# Patient Record
Sex: Female | Born: 1935 | Race: Black or African American | Hispanic: No | Smoking: Former smoker
Health system: Southern US, Community
[De-identification: ages and names within clinical notes are randomized; demographics above are authoritative.]

## PROBLEM LIST (undated history)

## (undated) ENCOUNTER — Emergency Department (HOSPITAL_COMMUNITY): Admission: EM | Payer: Self-pay | Source: Home / Self Care

## (undated) DIAGNOSIS — M1990 Unspecified osteoarthritis, unspecified site: Secondary | ICD-10-CM

## (undated) DIAGNOSIS — I420 Dilated cardiomyopathy: Secondary | ICD-10-CM

## (undated) DIAGNOSIS — G4733 Obstructive sleep apnea (adult) (pediatric): Secondary | ICD-10-CM

## (undated) DIAGNOSIS — M19011 Primary osteoarthritis, right shoulder: Secondary | ICD-10-CM

## (undated) DIAGNOSIS — D649 Anemia, unspecified: Secondary | ICD-10-CM

## (undated) DIAGNOSIS — I10 Essential (primary) hypertension: Secondary | ICD-10-CM

## (undated) DIAGNOSIS — S82202A Unspecified fracture of shaft of left tibia, initial encounter for closed fracture: Secondary | ICD-10-CM

## (undated) DIAGNOSIS — H409 Unspecified glaucoma: Secondary | ICD-10-CM

## (undated) DIAGNOSIS — I639 Cerebral infarction, unspecified: Secondary | ICD-10-CM

## (undated) DIAGNOSIS — K56609 Unspecified intestinal obstruction, unspecified as to partial versus complete obstruction: Secondary | ICD-10-CM

## (undated) DIAGNOSIS — R0600 Dyspnea, unspecified: Secondary | ICD-10-CM

## (undated) DIAGNOSIS — K219 Gastro-esophageal reflux disease without esophagitis: Secondary | ICD-10-CM

## (undated) DIAGNOSIS — I4891 Unspecified atrial fibrillation: Secondary | ICD-10-CM

## (undated) DIAGNOSIS — I5022 Chronic systolic (congestive) heart failure: Secondary | ICD-10-CM

## (undated) DIAGNOSIS — I442 Atrioventricular block, complete: Secondary | ICD-10-CM

## (undated) HISTORY — DX: Dilated cardiomyopathy: I42.0

## (undated) HISTORY — PX: CARDIAC CATHETERIZATION: SHX172

## (undated) HISTORY — DX: Chronic systolic (congestive) heart failure: I50.22

## (undated) HISTORY — PX: CATARACT EXTRACTION: SUR2

## (undated) HISTORY — PX: JOINT REPLACEMENT: SHX530

## (undated) HISTORY — DX: Unspecified atrial fibrillation: I48.91

## (undated) HISTORY — DX: Obstructive sleep apnea (adult) (pediatric): G47.33

## (undated) HISTORY — DX: Cerebral infarction, unspecified: I63.9

---
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
## 1957-08-30 HISTORY — PX: PILONIDAL CYST EXCISION: SHX744

## 1966-08-30 HISTORY — PX: DILATION AND CURETTAGE OF UTERUS: SHX78

## 1967-08-31 HISTORY — PX: ABDOMINAL HYSTERECTOMY: SHX81

## 1981-08-30 HISTORY — PX: EYE SURGERY: SHX253

## 1982-08-30 HISTORY — PX: BUNIONECTOMY: SHX129

## 1997-08-30 HISTORY — PX: OTHER SURGICAL HISTORY: SHX169

## 2002-08-30 HISTORY — PX: OTHER SURGICAL HISTORY: SHX169

## 2003-04-25 ENCOUNTER — Ambulatory Visit (HOSPITAL_COMMUNITY): Admission: RE | Admit: 2003-04-25 | Discharge: 2003-04-25 | Payer: Self-pay

## 2003-05-02 ENCOUNTER — Encounter: Admission: RE | Admit: 2003-05-02 | Discharge: 2003-05-02 | Payer: Self-pay | Admitting: Orthopedic Surgery

## 2003-05-02 ENCOUNTER — Encounter: Payer: Self-pay | Admitting: Orthopedic Surgery

## 2003-05-07 ENCOUNTER — Ambulatory Visit (HOSPITAL_BASED_OUTPATIENT_CLINIC_OR_DEPARTMENT_OTHER): Admission: RE | Admit: 2003-05-07 | Discharge: 2003-05-07 | Payer: Self-pay | Admitting: Orthopedic Surgery

## 2004-02-07 ENCOUNTER — Ambulatory Visit (HOSPITAL_BASED_OUTPATIENT_CLINIC_OR_DEPARTMENT_OTHER): Admission: RE | Admit: 2004-02-07 | Discharge: 2004-02-07 | Payer: Self-pay | Admitting: Orthopedic Surgery

## 2005-08-30 DIAGNOSIS — S82202A Unspecified fracture of shaft of left tibia, initial encounter for closed fracture: Secondary | ICD-10-CM

## 2005-08-30 HISTORY — DX: Unspecified fracture of shaft of left tibia, initial encounter for closed fracture: S82.202A

## 2005-09-14 ENCOUNTER — Encounter: Admission: RE | Admit: 2005-09-14 | Discharge: 2005-09-14 | Payer: Self-pay | Admitting: Family Medicine

## 2006-02-10 ENCOUNTER — Encounter: Admission: RE | Admit: 2006-02-10 | Discharge: 2006-05-11 | Payer: Self-pay | Admitting: Family Medicine

## 2006-02-12 ENCOUNTER — Emergency Department (HOSPITAL_COMMUNITY): Admission: EM | Admit: 2006-02-12 | Discharge: 2006-02-12 | Payer: Self-pay | Admitting: Emergency Medicine

## 2008-08-19 ENCOUNTER — Ambulatory Visit: Payer: Self-pay | Admitting: Vascular Surgery

## 2008-09-03 ENCOUNTER — Ambulatory Visit (HOSPITAL_BASED_OUTPATIENT_CLINIC_OR_DEPARTMENT_OTHER): Admission: RE | Admit: 2008-09-03 | Discharge: 2008-09-03 | Payer: Self-pay | Admitting: Orthopedic Surgery

## 2010-07-24 ENCOUNTER — Encounter: Admission: RE | Admit: 2010-07-24 | Discharge: 2010-07-24 | Payer: Self-pay | Admitting: Family Medicine

## 2010-07-27 ENCOUNTER — Encounter: Admission: RE | Admit: 2010-07-27 | Discharge: 2010-07-27 | Payer: Self-pay | Admitting: Family Medicine

## 2010-08-21 ENCOUNTER — Encounter
Admission: RE | Admit: 2010-08-21 | Discharge: 2010-08-21 | Payer: Self-pay | Source: Home / Self Care | Attending: Rheumatology | Admitting: Rheumatology

## 2011-01-12 NOTE — Op Note (Signed)
NAME:  Kim Mullins, Kim Mullins           ACCOUNT NO.:  [id number]   MEDICAL RECORD NO.:  [id number]          PATIENT TYPE:  AMB   LOCATION:  [place]                          FACILITY:  [facility]   PHYSICIAN:  Youlanda Mighty. Sypher, M.D. DATE OF BIRTH:  1930-03-27   DATE OF PROCEDURE:  09/03/2008  DATE OF DISCHARGE:                               OPERATIVE REPORT   PREOPERATIVE DIAGNOSIS:  Chronic stenosing tenosynovitis, right ring  finger, left long finger, and left ring finger.   POSTOPERATIVE DIAGNOSIS:  Chronic stenosing tenosynovitis, right ring  finger, left long finger, and left ring finger with identification of  ganglion cyst formation proximal to right ring finger A1 pulley.   OPERATION:  1. Release of right ring finger A1 pulley with debridement of myxoid      cyst from flexor sheath and flexor tendons.  2. Release of left long finger A1 pulley.  3. Release of left ring finger A1 pulley.   OPERATING SURGEON:  Youlanda Mighty. Sypher, MD   ASSISTANT:  Marily Lente Dasnoit, PA-C   ANESTHESIA:  General sedation supplemented by 2% lidocaine, field block  of right palm and left palm.   SUPERVISING ANESTHESIOLOGIST:  Soledad Gerlach, MD   INDICATIONS:  Kim Mullins is a 12-year woman referred through the  courtesy of Dr. Larina Earthly for evaluation and management of painful  locking trigger fingers.  Clinical examination revealed a otherwise  healthy 77 year old woman with stenosing tenosynovitis of multiple  fingers.  Arrangements were made for release of her involved fingers.  She had significant swelling over the flexor sheath of the right ring  finger, suggesting possible ganglion cyst formation.   After informed consent, she is brought to the operating room at this  time.   PROCEDURE IN DETAIL:  Kim Mullins is brought to the operating room  and placed in supine position on the operating table.  Following an  anesthesia consult with Dr. Ola Spurr, general anesthesia was  declined  and regional anesthesia with a field block of the palms and sedation  accepted.   She was brought to room 1 of the [facility] and placed in  supine position on the operating table.  Following light sedation, the  right and left arms were prepped with Betadine soap and solution and  sterilely draped.  A pneumatic tourniquet was applied to the proximal  left brachium and an Esmarch will be used on the right.   Following exsanguination of right hand and arm with the Esmarch, the  Esmarch was left on the proximal forearm as a tourniquet.  Procedure  commenced with a short transverse incision directly over the palpably  thickened A1 pulley of the ring finger.  Subcutaneous tissues were  carefully divided revealing a multilobular ganglion cyst proximal to the  A1 pulley.  This was debrided with a rongeur followed by identification  of the proximal and distal margins of the A1 pulley.  The pulleys were  released with scalpel and scissors.  The flexor tendons were delivered  and found to be rather degenerative in nature due to chronic  compression.   Free range of motion  of the fingers recovered.   Attention was then directed to the left hand.  The left hand was  exsanguinated with an Esmarch bandage and arterial tourniquet on the  brachium inflated to 280 mmHg due to systolic hypertension.   Procedure commenced with individual incisions over the A1 pulleys of the  left long and ring fingers.  Subcutaneous tissues were carefully divided  taking care to identify the flexor sheaths and neurovascular bundles.  The long and ring finger A1 pulleys were split with scalpel and scissors  followed by delivery of flexor tendons.  No tendon pathology was noted  on the left.  Thereafter, free range of motion of the left long and ring  fingers was recovered.   All wounds were then repaired with horizontal mattress sutures of 5-0  nylon.  Compressive dressings were applied with a  Xeroflo, sterile  gauze, and Ace bandages.   For aftercare, Ms. Dinwiddie will be encouraged to begin immediate range of  motion exercises.  To keep her wounds dry for 3 days.  Thereafter, she  may shower and redress her hands.  She is provided a prescription for  Percocet 5 mg 1 p.o. q.4-6 h. p.r.n. pain, 20 tablets without refill.  We will see her back in followup in 1 week for suture removal and  advancement to her exercise program.      Youlanda Mighty. Sypher, M.D.  Electronically Signed     RVS/MEDQ  D:  09/03/2008  T:  09/03/2008  Job:  [id number]   cc:   Tanya D. Hassell Done, M.D.

## 2011-01-15 NOTE — Op Note (Signed)
NAME:  Kim Mullins, Kim Mullins                       ACCOUNT NO.:  [id number]   MEDICAL RECORD NO.:  [id number]                   PATIENT TYPE:  AMB   LOCATION:  [place]                                  FACILITY:  [facility]   PHYSICIAN:  Youlanda Mighty. Luisa Dago., M.D.          DATE OF BIRTH:  October 16, 1929   DATE OF PROCEDURE:  05/07/2003  DATE OF DISCHARGE:                                 OPERATIVE REPORT   PREOPERATIVE DIAGNOSIS:  Chronic entrapment neuropathy, median nerve, left  carpal tunnel.   POSTOPERATIVE DIAGNOSIS:  Chronic entrapment neuropathy, median nerve, left  carpal tunnel.   OPERATION:  Release of left transverse carpal ligament.   OPERATING SURGEON:  Youlanda Mighty. Sypher, M.D.   ASSISTANT:  Julian Reil, P.A.   ANESTHESIA:  General by LMA supervised by the anesthesiologist, Lorrene Reid,  M.D.   INDICATIONS:  [name] is a 77 year old right-hand dominant retired  woman who presents for evaluation and management of bilateral hand numbness.  She has had numbness in her hands for more than a decade and has seen a  number of physicians in [place] and [place] for evaluation and  management of this.   She has had a series of electrodiagnostic studies repeatedly confirming the  diagnosis of carpal tunnel syndrome and has traveled to [place],  [place], and actually tried cold laser treatments for this, which were,  unfortunately, unsuccessful.   Due to a failure to respond to nonoperative measures, she presents for  evaluation at this time anticipating left transverse carpal ligament  release.   DESCRIPTION OF PROCEDURE:  [name] was brought to the operating  room and placed in the supine position on the operating table.  Following  induction of general anesthesia by LMA, the left arm was prepped with  Betadine soap and solution and sterilely draped.  Following exsanguination  of the limb with an Esmarch bandage, an arterial tourniquet on the  proximal  brachium was inflated to 230 mmHg.  The procedure commenced with a short  incision in the line of the ring finger of the palm.  Subcutaneous tissues  were carefully divided to reveal the palmar fascia.  This was split  longitudinally to reveal the common sensory branch of the median nerve.  The  branches were followed back to the median nerve proper, which was gently  isolated from the transverse carpal ligament.   The ligament was released along its ulnar border, extending into the distal  forearm.  This widely opened the carpal canal.  No masses or other  predicaments were noted.   Bleeding points along the margin of the released ligament were  electrocauterized with bipolar current, followed by repair of the skin with  intradermal 3-0 Prolene suture.   A compressive dressing was applied with a volar plaster splint maintaining  the wrist in 5 degrees of dorsiflexion.  Youlanda Mighty Luisa Dago., M.D.    RVS/MEDQ  D:  05/07/2003  T:  05/07/2003  Job:  [id number]

## 2011-01-15 NOTE — Op Note (Signed)
NAME:  Kim Mullins, Kim Mullins                       ACCOUNT NO.:  [id number]   MEDICAL RECORD NO.:  [id number]                   PATIENT TYPE:  AMB   LOCATION:  [place]                                  FACILITY:  [facility]   PHYSICIAN:  Youlanda Mighty. Luisa Dago., M.D.          DATE OF BIRTH:  [birth year]   DATE OF PROCEDURE:  02/07/2004  DATE OF DISCHARGE:                                 OPERATIVE REPORT   PREOPERATIVE DIAGNOSIS:  1. Chronic entrapment neuropathy right median nerve at carpal tunnel with     significant thenar atrophy and profoundly slowed conduction studies.  2. Stenosing tenosynovitis of right long finger at A1 pulley.   POSTOPERATIVE DIAGNOSIS:  1. Chronic entrapment neuropathy right median nerve at carpal tunnel with     significant thenar atrophy and profoundly slowed conduction studies.  2. Stenosing tenosynovitis of right long finger at A1 pulley.   OPERATION:  1. Release of right transverse carpal ligament.  2. Release of right long finger A1 pulley.   SURGEON:  Youlanda Mighty. Sypher, M.D.   ASSISTANT:  Julian Reil, P.A.   ANESTHESIA:  General by LMA.   ANESTHESIOLOGIST:  Jessy Oto. Albertina Parr, M.D.   INDICATIONS FOR PROCEDURE:  Kim Mullins is a 77 year old woman  referred by Dr. Larina Earthly for evaluation and management of hand numbness  and discomfort in the long finger right hand.  Clinical examination revealed  evidence of severe bilateral carpal tunnel syndrome.  Electrodiagnostic  studies completed by Dr. Derek Mound with correlation of prior studies from  [place], [place], revealed evidence of chronic and severe bilateral  carpal tunnel syndrome.  Kim Mullins is status post release of her left  transverse carpal ligament with partial recovery of her sensory function and  recovery of her thenar muscle function.  She now presents for evaluation and  management of her right hand.  She is noted to have profound thenar atrophy  on the right.  She also has  rather significant triggering of her right long  finger at the A1 pulley.  After informed consent, she is brought to the  operating room at this time to address both predicaments.   PROCEDURE:  Kim Mullins is brought to the operating room and placed in  supine position on the operating table.  Following induction of general  anesthesia by LMA, the right arm was prepped with Betadine solution and  sterilely draped.  Following exsanguination of the limb with an Esmarch  bandage, an arterial tourniquet was inflated to 220 mmHg and later elevated  to 250 mmHg due to mild systolic hypertension.   The procedure commenced with a short incision in the line of the distal  palmar crease directly over the A1 pulley of the long finger.  The  subcutaneous tissues were carefully divided releasing the palmar fascia.  The pulley was noted to be moderately thickened.  This was released along  its radial border in an effort to minimize  ulnar drift.  The A1 pulley was  released and the preputial fold is released proximally.  There was noted to  be swelling of the superficialis tendon.  Thereafter, triggering in the  finger was relieved.   Attention was then directed to the mid palm in the line of the ring finger.  A short incision was fashioned in the line of the ring finger exposing the  palmar fascia.  The fascia was split in line of its fibers to reveal the  common sensory branches of the median nerve.  These were followed back to  the median nerve proper with careful identification of the superficial  palmar arch.  The transverse carpal ligament was released with scissors  along its ulnar border extending into the distal forearm.  This widely  opened the carpal canal.  No masses or other predicaments were noted.   Bleeding points along the margins of the released ligament were  electrocauterized with bipolar current followed by repair of the skin with  intradermal 3-0 Prolene suture.  A  compressive dressing was applied with a  volar plaster splint with the wrist in 5 degrees dorsiflexion.  There were  no apparent complications.   For aftercare, Kim Mullins will return to our office in follow up in 7-10  days.  She is given a prescription for Percocet 5 mg 1-2 tablets p.o. q.4-  6h. p.r.n. pain.                                               Youlanda Mighty Luisa Dago., M.D.    RVS/MEDQ  D:  02/07/2004  T:  02/07/2004  Job:  [id number]

## 2011-02-10 ENCOUNTER — Other Ambulatory Visit: Payer: Self-pay | Admitting: Rheumatology

## 2011-02-10 DIAGNOSIS — M25551 Pain in right hip: Secondary | ICD-10-CM

## 2011-02-11 ENCOUNTER — Other Ambulatory Visit: Payer: Self-pay

## 2011-02-11 ENCOUNTER — Ambulatory Visit
Admission: RE | Admit: 2011-02-11 | Discharge: 2011-02-11 | Disposition: A | Discharge status: Home / Self Care | Payer: Medicare Other | Source: Ambulatory Visit | Attending: Rheumatology | Admitting: Rheumatology

## 2011-02-11 DIAGNOSIS — M25551 Pain in right hip: Secondary | ICD-10-CM

## 2011-06-01 ENCOUNTER — Other Ambulatory Visit: Payer: Self-pay | Admitting: Rheumatology

## 2011-06-01 DIAGNOSIS — M25551 Pain in right hip: Secondary | ICD-10-CM

## 2011-06-07 ENCOUNTER — Ambulatory Visit
Admission: RE | Admit: 2011-06-07 | Discharge: 2011-06-07 | Disposition: A | Discharge status: Home / Self Care | Payer: Medicare Other | Source: Ambulatory Visit | Attending: Rheumatology | Admitting: Rheumatology

## 2011-06-07 DIAGNOSIS — M25551 Pain in right hip: Secondary | ICD-10-CM

## 2011-06-07 MED ORDER — METHYLPREDNISOLONE ACETATE 40 MG/ML INJ SUSP (RADIOLOG
120.0000 mg | Freq: Once | INTRAMUSCULAR | Status: AC
  Administered 2011-06-07: 120 mg via INTRA_ARTICULAR

## 2011-06-07 MED ORDER — IOHEXOL 180 MG/ML  SOLN
1.0000 mL | Freq: Once | INTRAMUSCULAR | Status: AC | PRN
  Administered 2011-06-07: 1 mL via INTRA_ARTICULAR

## 2011-10-27 ENCOUNTER — Other Ambulatory Visit: Payer: Self-pay | Admitting: Orthopaedic Surgery

## 2011-11-02 ENCOUNTER — Emergency Department (HOSPITAL_COMMUNITY): Payer: Medicare Other

## 2011-11-02 ENCOUNTER — Emergency Department (HOSPITAL_COMMUNITY)
Admission: EM | Admit: 2011-11-02 | Discharge: 2011-11-03 | Disposition: A | Discharge status: Home / Self Care | Payer: Medicare Other | Attending: Emergency Medicine | Admitting: Emergency Medicine

## 2011-11-02 ENCOUNTER — Encounter (HOSPITAL_COMMUNITY): Payer: Self-pay | Admitting: Emergency Medicine

## 2011-11-02 DIAGNOSIS — K5641 Fecal impaction: Secondary | ICD-10-CM | POA: Insufficient documentation

## 2011-11-02 DIAGNOSIS — K5900 Constipation, unspecified: Secondary | ICD-10-CM | POA: Insufficient documentation

## 2011-11-02 DIAGNOSIS — E119 Type 2 diabetes mellitus without complications: Secondary | ICD-10-CM | POA: Insufficient documentation

## 2011-11-02 HISTORY — DX: Unspecified intestinal obstruction, unspecified as to partial versus complete obstruction: K56.609

## 2011-11-02 NOTE — ED Notes (Signed)
Pt to be moved back to stretcher 1.

## 2011-11-02 NOTE — ED Provider Notes (Signed)
History     CSN: [id number]  Arrival date & time 11/02/11  1901   First MD Initiated Contact with Patient 11/02/11 2157      No chief complaint on file.   (Consider location/radiation/quality/duration/timing/severity/associated sxs/prior treatment) Patient is a 78 y.o. female presenting with constipation. The history is provided by the patient.  Constipation  The current episode started today. The onset was sudden. Associated symptoms include nausea and rectal pain. Pertinent negatives include no fever, no abdominal pain and no vomiting.  PT states she has been trying to have a bowel movement today, but unable. States she feels like she needs to go, has been straining but no results. Denies abdominal pain or vomiting. States has had nausea earlier. Unable to recall last bowel movement. States has problems with constipation and taking a laxative every day. Tried sienna tea today as well with no relief.  Denies fever, chills, malaise. NO pain with urination. No other complaints.   Past Medical History  Diagnosis Date  . Bowel obstruction   . Diabetes mellitus     No past surgical history on file.  No family history on file.  History  Substance Use Topics  . Smoking status: Not on file  . Smokeless tobacco: Not on file  . Alcohol Use: No    OB History    Grav Para Term Preterm Abortions TAB SAB Ect Mult Living                  Review of Systems  Constitutional: Negative for fever and chills.  HENT: Negative.   Eyes: Negative.   Respiratory: Negative.   Cardiovascular: Negative.   Gastrointestinal: Positive for nausea, constipation and rectal pain. Negative for vomiting and abdominal pain.  Genitourinary: Negative for dysuria, urgency and flank pain.  Musculoskeletal: Negative.   Skin: Negative.   Neurological: Negative.   Psychiatric/Behavioral: Negative.     Allergies  Eggs or egg-derived products; Penicillins; and Quinine derivatives  Home Medications    Current Outpatient Rx  Name Route Sig Dispense Refill  . ACETAMINOPHEN 500 MG PO TABS Oral Take 1,000 mg by mouth daily as needed. For pain.    . ASPIRIN 81 MG PO CHEW Oral Chew 81 mg by mouth every morning.    Marland Kitchen HYDROCODONE-ACETAMINOPHEN 5-500 MG PO TABS Oral Take 1 tablet by mouth 2 (two) times daily as needed. For pain.    . IRON PO Oral Take 1 tablet by mouth daily.    . MELOXICAM 15 MG PO TABS Oral Take 15 mg by mouth daily.      BP 141/98  Pulse 74  Temp(Src) 98.6 F (37 C) (Oral)  Resp 20  SpO2 98%  Physical Exam  Nursing note and vitals reviewed. Constitutional: She is oriented to person, place, and time. She appears well-developed and well-nourished. No distress.  HENT:  Head: Normocephalic.  Eyes: Conjunctivae are normal.  Neck: Neck supple.  Cardiovascular: Normal rate, regular rhythm and normal heart sounds.   Pulmonary/Chest: Effort normal and breath sounds normal. No respiratory distress. She has no wheezes.  Abdominal: Soft. Bowel sounds are normal. She exhibits no distension. There is no tenderness. There is no guarding.  Genitourinary:       Impacted on rectal exam  Musculoskeletal: Normal range of motion. She exhibits no edema.  Neurological: She is alert and oriented to person, place, and time. No cranial nerve deficit.  Skin: Skin is warm and dry.  Psychiatric: She has a normal mood and affect.  ED Course  Procedures (including critical care time) Results for orders placed during the hospital encounter of 11/02/11  GLUCOSE, CAPILLARY      Component Value Range   Glucose-Capillary 197 (*) 70 - 99 (mg/dL)   Dg Abd 2 Views  11/02/2011  *RADIOLOGY REPORT*  Clinical Data: Diabetes, constipation  ABDOMEN - 2 VIEW  Comparison: None  Findings: Nonobstructive bowel gas pattern. No bowel dilatation or bowel wall thickening. Mild stool retention within the rectum and distal sigmoid colon. Bones diffusely demineralized. Advanced degenerative changes right hip  joint. No urinary tract calcification.  IMPRESSION: Nonobstructive bowel gas pattern.  Original Report Authenticated By: Burnetta Sabin, M.D.     Pt with fecal impaction.  X-ray shows no signs of obstruction. Pt denies abdominal pain, vomiting. Rectal exam positive for impaction, otherwise normal. Pt disimpacted by me. Able to have a good bowel movement on her own afterwards. Feels much better. Denies any more symptoms. Will d/c home.  No diagnosis found.    [place], PA 11/03/11 [place], [place] 11/03/11 [phone]

## 2011-11-02 NOTE — ED Notes (Addendum)
Per MD, PT reports feeling much better.

## 2011-11-02 NOTE — Discharge Instructions (Signed)
Continue drinking plenty of fluids. Continue your laxatives. If impacted again, you can try enemas. Follow up with your doctor.   Fecal Impaction A fecal impaction happens when there is a large, firm amount of stool (poop) that cannot be passed. The impacted stool is usually in the rectum, which is the lowest part of the large bowel. The impacted stool can block the colon and cause significant problems. CAUSES  The longer stool stays in the rectum, the harder it gets. Anything that slows down your bowel movements can lead to fecal impaction. These conditions include:  Constipation (having firm hard stools). This can be a longstanding (chronic) problem, or can happen suddenly (acutely).   Painful conditions of the rectum, such as hemorrhoids or anal fissures. The pain of these conditions can make you try to avoid having bowel movements.   Narcotic pain medications cause constipation, which can sometimes be severe. If you take narcotic pain medication, you should also talk with your caregiver about preventing constipation.   Not getting enough fluids.   Inactivity and bed rest over long periods of time.  SYMPTOMS  Some symptoms of fecal impaction include:  Lack of normal bowel movements or changes in bowel patterns.   Sense of fullness in the rectum, but unable to pass stool.   Pain or cramps in the stomach or abdominal area (often after meals).   Thin, watery discharge from the rectum.  Without treatment, a fecal impaction can block the colon and cause severe abdominal pain or colon tears (perforation). This may lead to surgery.  DIAGNOSIS  Fecal impaction is suspected based upon your symptoms and upon a physical examination. This will include an exam of your rectum, which can confirm the diagnosis. Sometimes x-rays or lab testing may be needed to confirm the diagnosis and be sure there are no other problems.  TREATMENT   Initially an impaction can be removed manually. Your caregiver,  using a gloved finger, can remove hard stool from your rectum.   Medication is sometimes needed. A suppository or enema can be given in the rectum to soften the stool. This can stimulate a bowel movement. Medicines can also be given by mouth (orally).   Surgery may be needed if the colon has torn (perforated) due to blockage. This is very rare.  HOME CARE INSTRUCTIONS   Develop regular bowel habits. This may be something such as getting in the habit of having a bowel movement after your morning cup of coffee or after eating. Be sure to allow yourself enough time on the toilet.   Maintain a high fiber diet.   Drink plenty of fluids each day. This is especially true for the elderly and especially during cold weather when thirst may not be as strong. Try to take in at least eight, 8 ounce glasses of water daily unless instructed otherwise.   Exercise regularly.   If you begin to get constipated, increase the amount of fiber in your diet. Eat plenty of fruits, vegetables, whole wheat breads, bran, oatmeal and similar products.   Natural fiber laxatives such as Metamucil are also very helpful.   Speak with your caregiver if you suspect medications may be causing constipation.  SEEK MEDICAL CARE IF:   You have ongoing constipation or a hard time passing your stools.   You have ongoing rectal pain.   You require enemas or suppositories more than twice a week.  SEEK IMMEDIATE MEDICAL CARE IF:   There are continued problems or you develop abdominal  pain.   You develop rectal bleeding.   You develop black or tarry stools or feel lightheaded.  MAKE SURE YOU:   Understand these instructions.   Will watch your condition.   Will get help right away if you are not doing well or get worse.  Document Released: 05/08/2004 Document Revised: 08/05/2011 Document Reviewed: 04/25/2008 [facility] Patient Information 2012 [place].

## 2011-11-02 NOTE — ED Notes (Signed)
Pt sts she thinks she has a bowel obstruction. She has not had a BM since Sunday. Pt has had this in the past. Some general abd pain noted.

## 2011-11-03 NOTE — ED Notes (Signed)
Pt ambulated with a steady gait;VSS; A&Ox3; no signs of distress; respirations even and unlabored; skin warm and dry; no questions at this time.

## 2011-11-03 NOTE — ED Notes (Signed)
Pt requested CBG check.

## 2011-11-03 NOTE — ED Provider Notes (Signed)
Medical screening examination/treatment/procedure(s) were conducted as a shared visit with non-physician practitioner(s) and myself.  I personally evaluated the patient during the encounter   Julianne Rice, MD 11/03/11 [phone]

## 2011-11-04 ENCOUNTER — Encounter (HOSPITAL_COMMUNITY): Payer: Self-pay

## 2011-11-05 ENCOUNTER — Other Ambulatory Visit (HOSPITAL_COMMUNITY): Payer: Medicare Other

## 2011-11-09 ENCOUNTER — Encounter (HOSPITAL_COMMUNITY): Payer: Self-pay

## 2011-11-09 ENCOUNTER — Other Ambulatory Visit: Payer: Self-pay

## 2011-11-09 ENCOUNTER — Encounter (HOSPITAL_COMMUNITY)
Admission: RE | Admit: 2011-11-09 | Discharge: 2011-11-09 | Disposition: A | Discharge status: Home / Self Care | Payer: Medicare Other | Source: Ambulatory Visit | Attending: Orthopaedic Surgery | Admitting: Orthopaedic Surgery

## 2011-11-09 ENCOUNTER — Ambulatory Visit (HOSPITAL_COMMUNITY)
Admission: RE | Admit: 2011-11-09 | Discharge: 2011-11-09 | Disposition: A | Discharge status: Home / Self Care | Payer: Medicare Other | Source: Ambulatory Visit | Attending: Orthopaedic Surgery | Admitting: Orthopaedic Surgery

## 2011-11-09 DIAGNOSIS — Z01810 Encounter for preprocedural cardiovascular examination: Secondary | ICD-10-CM | POA: Insufficient documentation

## 2011-11-09 DIAGNOSIS — I517 Cardiomegaly: Secondary | ICD-10-CM | POA: Insufficient documentation

## 2011-11-09 DIAGNOSIS — Z01818 Encounter for other preprocedural examination: Secondary | ICD-10-CM | POA: Insufficient documentation

## 2011-11-09 DIAGNOSIS — Z01812 Encounter for preprocedural laboratory examination: Secondary | ICD-10-CM | POA: Insufficient documentation

## 2011-11-09 HISTORY — DX: Anemia, unspecified: D64.9

## 2011-11-09 HISTORY — DX: Unspecified osteoarthritis, unspecified site: M19.90

## 2011-11-09 HISTORY — DX: Unspecified fracture of shaft of left tibia, initial encounter for closed fracture: S82.202A

## 2011-11-09 LAB — CBC
Hemoglobin: 11.9 g/dL — ABNORMAL LOW (ref 12.0–15.0)
MCV: 91 fL (ref 78.0–100.0)
Platelets: 243 10*3/uL (ref 150–400)
RBC: 3.99 MIL/uL (ref 3.87–5.11)
WBC: 6.5 10*3/uL (ref 4.0–10.5)

## 2011-11-09 LAB — BASIC METABOLIC PANEL
CO2: 27 mEq/L (ref 19–32)
Calcium: 9.1 mg/dL (ref 8.4–10.5)
Chloride: 103 mEq/L (ref 96–112)
Potassium: 4 mEq/L (ref 3.5–5.1)
Sodium: 138 mEq/L (ref 135–145)

## 2011-11-09 LAB — DIFFERENTIAL
Basophils Absolute: 0.1 K/uL (ref 0.0–0.1)
Basophils Relative: 1 % (ref 0–1)
Eosinophils Absolute: 0.2 K/uL (ref 0.0–0.7)
Eosinophils Relative: 3 % (ref 0–5)
Lymphocytes Relative: 38 % (ref 12–46)
Lymphs Abs: 2.5 K/uL (ref 0.7–4.0)
Monocytes Absolute: 0.5 K/uL (ref 0.1–1.0)
Monocytes Relative: 8 % (ref 3–12)
Neutro Abs: 3.3 K/uL (ref 1.7–7.7)
Neutrophils Relative %: 51 % (ref 43–77)

## 2011-11-09 LAB — TYPE AND SCREEN: Antibody Screen: NEGATIVE

## 2011-11-09 LAB — URINALYSIS, ROUTINE W REFLEX MICROSCOPIC
Ketones, ur: 15 mg/dL — AB
Nitrite: NEGATIVE
Urobilinogen, UA: 1 mg/dL (ref 0.0–1.0)

## 2011-11-09 LAB — URINE MICROSCOPIC-ADD ON

## 2011-11-09 LAB — PROTIME-INR
INR: 0.99 (ref 0.00–1.49)
Prothrombin Time: 13.3 seconds (ref 11.6–15.2)

## 2011-11-09 LAB — ABO/RH: ABO/RH(D): B POS

## 2011-11-09 LAB — APTT: aPTT: 30 seconds (ref 24–37)

## 2011-11-09 MED ORDER — CHLORHEXIDINE GLUCONATE 4 % EX LIQD: 60.0000 mL | Freq: Once | CUTANEOUS | Status: DC

## 2011-11-09 NOTE — Pre-Procedure Instructions (Addendum)
20 Kim Mullins  11/09/2011   Your procedure is scheduled on:   11/16/11   Tuesday    Report to [facility] at 920 AM.  Call this number if you have problems the morning of surgery: [phone]   Remember:   Do not eat food:After Midnight.  May have clear liquids: up to 4 Hours before arrival.  Clear liquids include soda, tea, black coffee, apple or grape juice, broth.  Take these medicines the morning of surgery with A SIP OF WATER: hydrocodone or Tylenol   Do not wear jewelry, make-up or nail polish.  Do not wear lotions, powders, or perfumes. You may wear deodorant.  Do not shave 48 hours prior to surgery.  Do not bring valuables to the hospital.  Contacts, dentures or bridgework may not be worn into surgery.  Leave suitcase in the car. After surgery it may be brought to your room.  For patients admitted to the hospital, checkout time is 11:00 AM the day of discharge.   Patients discharged the day of surgery will not be allowed to drive home.  Name and phone number of your driver: NA  Special Instructions: Incentive Spirometry - Practice and bring it with you on the day of surgery. and CHG Shower Use Special Wash: 1/2 bottle night before surgery and 1/2 bottle morning of surgery.   Please read over the following fact sheets that you were given: Pain Booklet, Total Joint Packet, MRSA Information and Surgical Site Infection Prevention

## 2011-11-10 NOTE — Consult Note (Addendum)
Anesthesia:  Patient is a 78 year old female scheduled for a right hip arthroplasty on 11/16/11.  Her history includes former smoker, asthma, arthritis, anemia, obesity, DM2 not currently requiring meds, bowel obstruction.  There is no reported history of CAD/MI/CHF.  Labs noted.  UA shows large leukocytes, negative nitrites, 7-10 WBC, few bacteria.  Voicemail re: UA results left with Sandi Raveling at Dr. Jerald Kief office.  (They are going to prescribe her a 5 day coarse of Cipro.)  CXR findings show mild cardiomegaly. Tortuous thoracic aorta. Lungs are clear. No effusion. Advanced degenerative changes in bilateral shoulders.  EKG shows NSR, LAFB, minimal voltage criteria for LVH, cannot rule out anterior infarct (age undetermined).  It was not felt significantly changed from her EKG in 2010.  No CV symptoms were reported at PAT.  Clinical correlation on the day of surgery.  If remains asymptomatic then anticipate she can proceed.

## 2011-11-11 LAB — MRSA CULTURE

## 2011-11-11 NOTE — H&P (Signed)
Kim Mullins is an 78 y.o. female.   Chief Complaint: Right hip pain HPI: C/O increasing right hip pain over many months.She is now having severe pain walking and at night. She has tried therapy and NSAID'S with no relief. 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 We have discussed going ahead with a right THR.  Past Medical History  Diagnosis Date  . Bowel obstruction   . Left tibial fracture 2007  . Asthma     Allergixc reaction to cats.  . Anemia     occassionally  . Arthritis     all over.  . Diabetes mellitus 2006    Diet and exercise controlled.    Past Surgical History  Procedure Date  . Abdominal hysterectomy 1969  . Dilation and curettage of uterus 1968  . Pilonidal cyst excision 1959  . Corn removal 1999    Bilateral feet  . Bunionectomy 1984    Bilateral  . Tonsillectomy 1942  . Eye surgery 1983    Surgery to fix Retainal detachment, bilateral  . Carpal tunnell 2004    Bilateral    No family history on file. Social History:  reports that she quit smoking about 33 years ago. Her smoking use included Cigarettes. She has a 45 pack-year smoking history. She does not have any smokeless tobacco history on file. She reports that she drinks about 4.2 ounces of alcohol per week. Her drug history not on file.  Allergies:  Allergies  Allergen Reactions  . Eggs Or Egg-Derived Products Hives and Rash  . Penicillins Hives and Rash  . Quinine Derivatives Hives and Rash    No current facility-administered medications on file as of .   Medications Prior to Admission  Medication Sig Dispense Refill  . bisacodyl (DULCOLAX) 5 MG EC tablet Take 5-10 mg by mouth daily as needed. For constipation        Results for orders placed during the hospital encounter of 11/09/11 (from the past 48 hour(s))  MRSA CULTURE     Status: Normal   Collection Time   11/09/11  4:38 PM      Component Value Range Comment   Specimen Description NASOPHARYNGEAL      Special  Requests NONE      Culture        Value: NO STAPHYLOCOCCUS AUREUS ISOLATED     Note: [place]   Report Status 11/11/2011 FINAL      Dg Chest 2 View  11/09/2011  *RADIOLOGY REPORT*  Clinical Data: Right hip replacement preop  CHEST - 2 VIEW  Comparison: None.  Findings: Mild cardiomegaly.  Tortuous thoracic aorta.  Lungs are clear.  No effusion.  Advanced degenerative changes in bilateral shoulders.  IMPRESSION:  1.  Mild cardiomegaly.  Original Report Authenticated By: Trecia Rogers, M.D.    Review of Systems  Constitutional: Negative.   HENT: Negative.   Eyes: Negative.   Respiratory: Negative.   Cardiovascular: Negative.   Gastrointestinal: Negative.   Genitourinary: Negative.   Musculoskeletal: Negative.   Skin: Negative.   Neurological: Negative.   Endo/Heme/Allergies: Negative.   Psychiatric/Behavioral: Negative.     There were no vitals taken for this visit. Physical Exam  Constitutional: She is oriented to person, place, and time. She appears well-developed.  HENT:  Head: Normocephalic.  Eyes: Pupils are equal, round, and reactive to light.  Neck: Normal range of motion.  Cardiovascular: Normal rate, regular rhythm and normal heart sounds.   Respiratory: Effort normal  and breath sounds normal.  GI: Soft. Bowel sounds are normal.  Musculoskeletal:       Right hip - limited rotation, pain with any motion. Painful ambulation.good n/v status  Neurological: She is oriented to person, place, and time.  Skin: Skin is warm.  Psychiatric: She has a normal mood and affect.     Assessment/Plan A: Right hip end stage DJD P:Right THR to help reduce pain and improve function. Risks are discussed with pt.  Micheal Sheen R 11/11/2011, 1:08 PM

## 2011-11-16 ENCOUNTER — Ambulatory Visit (HOSPITAL_COMMUNITY): Payer: Medicare Other

## 2011-11-16 ENCOUNTER — Ambulatory Visit (HOSPITAL_COMMUNITY): Payer: Medicare Other | Admitting: Vascular Surgery

## 2011-11-16 ENCOUNTER — Encounter (HOSPITAL_COMMUNITY): Payer: Self-pay | Admitting: Vascular Surgery

## 2011-11-16 ENCOUNTER — Encounter (HOSPITAL_COMMUNITY)
Admission: RE | Disposition: A | Discharge status: Skilled Nursing Facility | Payer: Self-pay | Source: Ambulatory Visit | Attending: Orthopaedic Surgery

## 2011-11-16 ENCOUNTER — Inpatient Hospital Stay (HOSPITAL_COMMUNITY)
Admission: RE | Admit: 2011-11-16 | Discharge: 2011-11-22 | DRG: 470 | Disposition: A | Discharge status: Skilled Nursing Facility | Payer: Medicare Other | Source: Ambulatory Visit | Attending: Orthopaedic Surgery | Admitting: Orthopaedic Surgery

## 2011-11-16 DIAGNOSIS — Z87891 Personal history of nicotine dependence: Secondary | ICD-10-CM

## 2011-11-16 DIAGNOSIS — E119 Type 2 diabetes mellitus without complications: Secondary | ICD-10-CM | POA: Diagnosis present

## 2011-11-16 DIAGNOSIS — Z7901 Long term (current) use of anticoagulants: Secondary | ICD-10-CM

## 2011-11-16 DIAGNOSIS — Z79899 Other long term (current) drug therapy: Secondary | ICD-10-CM

## 2011-11-16 DIAGNOSIS — J45909 Unspecified asthma, uncomplicated: Secondary | ICD-10-CM | POA: Diagnosis present

## 2011-11-16 DIAGNOSIS — M169 Osteoarthritis of hip, unspecified: Principal | ICD-10-CM | POA: Diagnosis present

## 2011-11-16 DIAGNOSIS — M1610 Unilateral primary osteoarthritis, unspecified hip: Principal | ICD-10-CM | POA: Diagnosis present

## 2011-11-16 DIAGNOSIS — Z880 Allergy status to penicillin: Secondary | ICD-10-CM

## 2011-11-16 DIAGNOSIS — Z91012 Allergy to eggs: Secondary | ICD-10-CM

## 2011-11-16 HISTORY — PX: TOTAL HIP ARTHROPLASTY: SHX124

## 2011-11-16 LAB — GLUCOSE, CAPILLARY: Glucose-Capillary: 114 mg/dL — ABNORMAL HIGH (ref 70–99)

## 2011-11-16 SURGERY — ARTHROPLASTY, HIP, TOTAL, ANTERIOR APPROACH
Anesthesia: General | Site: Hip | Laterality: Right | Wound class: Clean

## 2011-11-16 MED ORDER — ACETAMINOPHEN 650 MG RE SUPP: 650.0000 mg | Freq: Four times a day (QID) | RECTAL | Status: DC | PRN

## 2011-11-16 MED ORDER — WARFARIN - PHARMACIST DOSING INPATIENT: Freq: Every day | Status: DC

## 2011-11-16 MED ORDER — ONDANSETRON HCL 4 MG/2ML IJ SOLN
INTRAMUSCULAR | Status: DC | PRN
  Administered 2011-11-16: 4 mg via INTRAVENOUS

## 2011-11-16 MED ORDER — COUMADIN BOOK
Freq: Once | Status: AC
  Administered 2011-11-16: 22:00:00
  Filled 2011-11-16: qty 1

## 2011-11-16 MED ORDER — METOCLOPRAMIDE HCL 5 MG/ML IJ SOLN: 5.0000 mg | Freq: Three times a day (TID) | INTRAMUSCULAR | Status: DC | PRN

## 2011-11-16 MED ORDER — DOCUSATE SODIUM 100 MG PO CAPS
100.0000 mg | ORAL_CAPSULE | Freq: Two times a day (BID) | ORAL | Status: DC
  Administered 2011-11-16 – 2011-11-22 (×12): 100 mg via ORAL
  Filled 2011-11-16 (×13): qty 1

## 2011-11-16 MED ORDER — FERROUS SULFATE 325 (65 FE) MG PO TABS
325.0000 mg | ORAL_TABLET | Freq: Every day | ORAL | Status: DC
  Administered 2011-11-17 – 2011-11-22 (×6): 325 mg via ORAL
  Filled 2011-11-16 (×7): qty 1

## 2011-11-16 MED ORDER — LACTATED RINGERS IV SOLN
INTRAVENOUS | Status: DC
  Administered 2011-11-16: 22:00:00 via INTRAVENOUS

## 2011-11-16 MED ORDER — LACTATED RINGERS IV SOLN
INTRAVENOUS | Status: DC | PRN
  Administered 2011-11-16 (×2): via INTRAVENOUS

## 2011-11-16 MED ORDER — METHOCARBAMOL 500 MG PO TABS
500.0000 mg | ORAL_TABLET | Freq: Four times a day (QID) | ORAL | Status: DC | PRN
  Administered 2011-11-16 – 2011-11-22 (×4): 500 mg via ORAL
  Filled 2011-11-16 (×4): qty 1

## 2011-11-16 MED ORDER — PHENOL 1.4 % MT LIQD: 1.0000 | OROMUCOSAL | Status: DC | PRN

## 2011-11-16 MED ORDER — LACTATED RINGERS IV SOLN
INTRAVENOUS | Status: DC
  Administered 2011-11-16 (×2): via INTRAVENOUS

## 2011-11-16 MED ORDER — METHOCARBAMOL 100 MG/ML IJ SOLN
500.0000 mg | Freq: Four times a day (QID) | INTRAVENOUS | Status: DC | PRN
  Filled 2011-11-16: qty 5

## 2011-11-16 MED ORDER — VANCOMYCIN HCL IN DEXTROSE 1-5 GM/200ML-% IV SOLN
INTRAVENOUS | Status: AC
  Filled 2011-11-16: qty 200

## 2011-11-16 MED ORDER — WARFARIN SODIUM 5 MG PO TABS
5.0000 mg | ORAL_TABLET | Freq: Once | ORAL | Status: AC
  Administered 2011-11-16: 5 mg via ORAL
  Filled 2011-11-16: qty 1

## 2011-11-16 MED ORDER — HYDROMORPHONE HCL PF 1 MG/ML IJ SOLN
0.2500 mg | INTRAMUSCULAR | Status: DC | PRN
  Administered 2011-11-16 (×3): 0.25 mg via INTRAVENOUS

## 2011-11-16 MED ORDER — ONDANSETRON HCL 4 MG PO TABS: 4.0000 mg | ORAL_TABLET | Freq: Four times a day (QID) | ORAL | Status: DC | PRN

## 2011-11-16 MED ORDER — GLYCOPYRROLATE 0.2 MG/ML IJ SOLN
INTRAMUSCULAR | Status: DC | PRN
  Administered 2011-11-16: 0.4 mg via INTRAVENOUS

## 2011-11-16 MED ORDER — ENOXAPARIN SODIUM 40 MG/0.4ML ~~LOC~~ SOLN
40.0000 mg | SUBCUTANEOUS | Status: DC
  Administered 2011-11-16 – 2011-11-20 (×5): 40 mg via SUBCUTANEOUS
  Filled 2011-11-16 (×7): qty 0.4

## 2011-11-16 MED ORDER — PROPOFOL 10 MG/ML IV EMUL
INTRAVENOUS | Status: DC | PRN
  Administered 2011-11-16: 160 mg via INTRAVENOUS

## 2011-11-16 MED ORDER — NEOSTIGMINE METHYLSULFATE 1 MG/ML IJ SOLN
INTRAMUSCULAR | Status: DC | PRN
  Administered 2011-11-16: 3 mg via INTRAVENOUS

## 2011-11-16 MED ORDER — FENTANYL CITRATE 0.05 MG/ML IJ SOLN: 50.0000 ug | INTRAMUSCULAR | Status: DC | PRN

## 2011-11-16 MED ORDER — HYDROMORPHONE HCL PF 1 MG/ML IJ SOLN: 0.5000 mg | INTRAMUSCULAR | Status: DC | PRN

## 2011-11-16 MED ORDER — ROCURONIUM BROMIDE 100 MG/10ML IV SOLN
INTRAVENOUS | Status: DC | PRN
  Administered 2011-11-16: 50 mg via INTRAVENOUS
  Administered 2011-11-16: 10 mg via INTRAVENOUS

## 2011-11-16 MED ORDER — VANCOMYCIN HCL IN DEXTROSE 1-5 GM/200ML-% IV SOLN
1000.0000 mg | Freq: Two times a day (BID) | INTRAVENOUS | Status: AC
  Administered 2011-11-16: 1000 mg via INTRAVENOUS
  Filled 2011-11-16: qty 200

## 2011-11-16 MED ORDER — WARFARIN VIDEO: Freq: Once | Status: DC

## 2011-11-16 MED ORDER — ONDANSETRON HCL 4 MG/2ML IJ SOLN: 4.0000 mg | Freq: Four times a day (QID) | INTRAMUSCULAR | Status: DC | PRN

## 2011-11-16 MED ORDER — EPHEDRINE SULFATE 50 MG/ML IJ SOLN
INTRAMUSCULAR | Status: DC | PRN
  Administered 2011-11-16: 10 mg via INTRAVENOUS

## 2011-11-16 MED ORDER — OXYCODONE-ACETAMINOPHEN 5-325 MG PO TABS
1.0000 | ORAL_TABLET | ORAL | Status: DC | PRN
  Administered 2011-11-17 (×4): 2 via ORAL
  Administered 2011-11-17: 1 via ORAL
  Administered 2011-11-18 – 2011-11-19 (×4): 2 via ORAL
  Administered 2011-11-19 – 2011-11-20 (×2): 1 via ORAL
  Administered 2011-11-20: 2 via ORAL
  Administered 2011-11-20: 1 via ORAL
  Administered 2011-11-20 – 2011-11-22 (×6): 2 via ORAL
  Filled 2011-11-16 (×5): qty 2
  Filled 2011-11-16: qty 1
  Filled 2011-11-16: qty 2
  Filled 2011-11-16: qty 1
  Filled 2011-11-16 (×2): qty 2
  Filled 2011-11-16: qty 1
  Filled 2011-11-16 (×8): qty 2

## 2011-11-16 MED ORDER — LIDOCAINE HCL (CARDIAC) 20 MG/ML IV SOLN
INTRAVENOUS | Status: DC | PRN
  Administered 2011-11-16: 60 mg via INTRAVENOUS

## 2011-11-16 MED ORDER — VANCOMYCIN HCL 1000 MG IV SOLR
1000.0000 mg | INTRAVENOUS | Status: DC | PRN
  Administered 2011-11-16: 1000 mg via INTRAVENOUS

## 2011-11-16 MED ORDER — ACETAMINOPHEN 325 MG PO TABS
650.0000 mg | ORAL_TABLET | Freq: Four times a day (QID) | ORAL | Status: DC | PRN
  Administered 2011-11-19: 650 mg via ORAL
  Filled 2011-11-16: qty 2

## 2011-11-16 MED ORDER — BISACODYL 5 MG PO TBEC
5.0000 mg | DELAYED_RELEASE_TABLET | Freq: Every day | ORAL | Status: DC | PRN
  Administered 2011-11-21: 10 mg via ORAL
  Filled 2011-11-16: qty 2

## 2011-11-16 MED ORDER — MENTHOL 3 MG MT LOZG: 1.0000 | LOZENGE | OROMUCOSAL | Status: DC | PRN

## 2011-11-16 MED ORDER — MIDAZOLAM HCL 2 MG/2ML IJ SOLN: 1.0000 mg | INTRAMUSCULAR | Status: DC | PRN

## 2011-11-16 MED ORDER — LORAZEPAM 2 MG/ML IJ SOLN: 1.0000 mg | Freq: Once | INTRAMUSCULAR | Status: DC | PRN

## 2011-11-16 MED ORDER — FLEET ENEMA 7-19 GM/118ML RE ENEM: 1.0000 | ENEMA | Freq: Once | RECTAL | Status: AC | PRN

## 2011-11-16 MED ORDER — FENTANYL CITRATE 0.05 MG/ML IJ SOLN
INTRAMUSCULAR | Status: DC | PRN
  Administered 2011-11-16: 25 ug via INTRAVENOUS
  Administered 2011-11-16 (×2): 50 ug via INTRAVENOUS
  Administered 2011-11-16: 100 ug via INTRAVENOUS
  Administered 2011-11-16: 25 ug via INTRAVENOUS

## 2011-11-16 MED ORDER — OXYCODONE-ACETAMINOPHEN 5-325 MG PO TABS
ORAL_TABLET | ORAL | Status: AC
  Administered 2011-11-16: 1
  Filled 2011-11-16: qty 1

## 2011-11-16 MED ORDER — METOCLOPRAMIDE HCL 5 MG PO TABS
5.0000 mg | ORAL_TABLET | Freq: Three times a day (TID) | ORAL | Status: DC | PRN
  Filled 2011-11-16: qty 2

## 2011-11-16 SURGICAL SUPPLY — 55 items
BLADE SAW SAG 73X25 THK (BLADE) ×1
BLADE SAW SGTL 18X1.27X75 (BLADE) ×2 IMPLANT
BLADE SAW SGTL 73X25 THK (BLADE) ×1 IMPLANT
BLADE SURG ROTATE 9660 (MISCELLANEOUS) IMPLANT
BRUSH FEMORAL CANAL (MISCELLANEOUS) IMPLANT
CELLS DAT CNTRL 66122 CELL SVR (MISCELLANEOUS) ×1 IMPLANT
CLOTH BEACON ORANGE TIMEOUT ST (SAFETY) ×2 IMPLANT
COVER BACK TABLE 24X17X13 BIG (DRAPES) IMPLANT
COVER SURGICAL LIGHT HANDLE (MISCELLANEOUS) ×2 IMPLANT
DRAPE C-ARM 42X72 X-RAY (DRAPES) ×2 IMPLANT
DRAPE INCISE IOBAN 85X60 (DRAPES) ×4 IMPLANT
DRAPE STERI IOBAN 125X83 (DRAPES) ×2 IMPLANT
DRAPE U-SHAPE 47X51 STRL (DRAPES) ×6 IMPLANT
DRSG MEPILEX BORDER 4X12 (GAUZE/BANDAGES/DRESSINGS) IMPLANT
DRSG MEPILEX BORDER 4X8 (GAUZE/BANDAGES/DRESSINGS) ×2 IMPLANT
DURAPREP 26ML APPLICATOR (WOUND CARE) ×2 IMPLANT
ELECT BLADE 4.0 EZ CLEAN MEGAD (MISCELLANEOUS)
ELECT BLADE TIP CTD 4 INCH (ELECTRODE) ×2 IMPLANT
ELECT CAUTERY BLADE 6.4 (BLADE) ×2 IMPLANT
ELECT REM PT RETURN 9FT ADLT (ELECTROSURGICAL) ×2
ELECTRODE BLDE 4.0 EZ CLN MEGD (MISCELLANEOUS) IMPLANT
ELECTRODE REM PT RTRN 9FT ADLT (ELECTROSURGICAL) ×1 IMPLANT
FACESHIELD LNG OPTICON STERILE (SAFETY) ×4 IMPLANT
GAUZE XEROFORM 1X8 LF (GAUZE/BANDAGES/DRESSINGS) ×2 IMPLANT
GLOVE BIO SURGEON STRL SZ8 (GLOVE) ×2 IMPLANT
GLOVE BIO SURGEON STRL SZ8.5 (GLOVE) ×2 IMPLANT
GLOVE BIOGEL PI IND STRL 8 (GLOVE) ×1 IMPLANT
GLOVE BIOGEL PI IND STRL 8.5 (GLOVE) ×1 IMPLANT
GLOVE BIOGEL PI INDICATOR 8 (GLOVE) ×1
GLOVE BIOGEL PI INDICATOR 8.5 (GLOVE) ×1
GOWN PREVENTION PLUS LG XLONG (DISPOSABLE) IMPLANT
GOWN STRL NON-REIN LRG LVL3 (GOWN DISPOSABLE) ×4 IMPLANT
GOWN STRL REIN XL XLG (GOWN DISPOSABLE) ×2 IMPLANT
KIT BASIN OR (CUSTOM PROCEDURE TRAY) ×2 IMPLANT
KIT ROOM TURNOVER OR (KITS) ×2 IMPLANT
MANIFOLD NEPTUNE II (INSTRUMENTS) ×2 IMPLANT
NS IRRIG 1000ML POUR BTL (IV SOLUTION) ×2 IMPLANT
PACK TOTAL JOINT (CUSTOM PROCEDURE TRAY) ×2 IMPLANT
PAD ARMBOARD 7.5X6 YLW CONV (MISCELLANEOUS) ×4 IMPLANT
RTRCTR WOUND ALEXIS 18CM MED (MISCELLANEOUS) ×2
SPONGE LAP 18X18 X RAY DECT (DISPOSABLE) ×2 IMPLANT
SPONGE LAP 4X18 X RAY DECT (DISPOSABLE) IMPLANT
STAPLER SKIN PROX WIDE 3.9 (STAPLE) ×2 IMPLANT
STAPLER VISISTAT 35W (STAPLE) ×2 IMPLANT
SUT ETHIBOND NAB CT1 #1 30IN (SUTURE) ×4 IMPLANT
SUT VIC AB 0 CT1 27 (SUTURE) ×2
SUT VIC AB 0 CT1 27XBRD ANBCTR (SUTURE) ×2 IMPLANT
SUT VIC AB 1 CT1 27 (SUTURE) ×2
SUT VIC AB 1 CT1 27XBRD ANBCTR (SUTURE) ×2 IMPLANT
SUT VIC AB 2-0 CT1 27 (SUTURE) ×2
SUT VIC AB 2-0 CT1 TAPERPNT 27 (SUTURE) ×2 IMPLANT
TOWEL OR 17X24 6PK STRL BLUE (TOWEL DISPOSABLE) ×2 IMPLANT
TOWEL OR 17X26 10 PK STRL BLUE (TOWEL DISPOSABLE) ×4 IMPLANT
TRAY FOLEY CATH 14FR (SET/KITS/TRAYS/PACK) IMPLANT
WATER STERILE IRR 1000ML POUR (IV SOLUTION) ×4 IMPLANT

## 2011-11-16 NOTE — Preoperative (Signed)
Beta Blockers   Reason not to administer Beta Blockers:Not Applicable 

## 2011-11-16 NOTE — Anesthesia Postprocedure Evaluation (Signed)
  Anesthesia Post-op Note  Patient: Kim Mullins  Procedure(s) Performed: Procedure(s) (LRB): TOTAL HIP ARTHROPLASTY ANTERIOR APPROACH (Right)  Patient Location: PACU  Anesthesia Type: General  Level of Consciousness: awake and alert   Airway and Oxygen Therapy: Patient Spontanous Breathing  Post-op Pain: mild  Post-op Assessment: Post-op Vital signs reviewed, Patient's Cardiovascular Status Stable, Respiratory Function Stable, Patent Airway and Pain level controlled  Post-op Vital Signs: stable  Complications: No apparent anesthesia complications

## 2011-11-16 NOTE — Anesthesia Procedure Notes (Signed)
Procedure Name: Intubation Date/Time: 11/16/2011 11:47 AM Performed by: Kyung Rudd Pre-anesthesia Checklist: Patient identified, Emergency Drugs available, Suction available, Patient being monitored and Timeout performed Patient Re-evaluated:Patient Re-evaluated prior to inductionOxygen Delivery Method: Circle system utilized Preoxygenation: Pre-oxygenation with 100% oxygen Intubation Type: IV induction Ventilation: Mask ventilation without difficulty Laryngoscope Size: Mac and 3 Grade View: Grade II Tube type: Oral Tube size: 7.0 mm Number of attempts: 1 Airway Equipment and Method: Stylet Placement Confirmation: ETT inserted through vocal cords under direct vision,  positive ETCO2 and breath sounds checked- equal and bilateral Secured at: 21 cm Tube secured with: Tape Dental Injury: Teeth and Oropharynx as per pre-operative assessment

## 2011-11-16 NOTE — Interval H&P Note (Signed)
History and Physical Interval Note:  11/16/2011 10:47 AM  Kim Mullins  has presented today for surgery, with the diagnosis of RIGHT HIP DEGENERATIVE JOINT DISEASE  The various methods of treatment have been discussed with the patient and family. After consideration of risks, benefits and other options for treatment, the patient has consented to  Procedure(s) (LRB): TOTAL HIP ARTHROPLASTY ANTERIOR APPROACH (Right) as a surgical intervention .  The patients' history has been reviewed, patient examined, no change in status, stable for surgery.  I have reviewed the patients' chart and labs.  Questions were answered to the patient's satisfaction.     Inanna Telford G

## 2011-11-16 NOTE — Brief Op Note (Signed)
MAZARINE PORRITT [id number] 11/16/2011   PRE-OP DIAGNOSIS: right hip DJD  POST-OP DIAGNOSIS: same  PROCEDURE: right THR  ANESTHESIA: general  Khamiyah Grefe G   Dictation #:

## 2011-11-16 NOTE — Transfer of Care (Signed)
Immediate Anesthesia Transfer of Care Note  Patient: Kim Mullins  Procedure(s) Performed: Procedure(s) (LRB): TOTAL HIP ARTHROPLASTY ANTERIOR APPROACH (Right)  Patient Location: PACU  Anesthesia Type: General  Level of Consciousness: awake, alert  and oriented  Airway & Oxygen Therapy: Patient Spontanous Breathing and Patient connected to nasal cannula oxygen  Post-op Assessment: Report given to PACU RN and Post -op Vital signs reviewed and stable  Post vital signs: Reviewed and stable  Complications: No apparent anesthesia complications

## 2011-11-16 NOTE — Progress Notes (Signed)
PT IS ALSO ALLERGIC TO CATS AND SEASONAL  ALLERGIES ALSO BOTHER HER.

## 2011-11-16 NOTE — Anesthesia Preprocedure Evaluation (Addendum)
Anesthesia Evaluation  Patient identified by MRN, date of birth, ID band Patient awake    Reviewed: Allergy & Precautions, H&P , NPO status , Patient's Chart, lab work & pertinent test results  Airway Mallampati: II TM Distance: >3 FB Neck ROM: Full    Dental  (+) Partial Upper and Partial Lower   Pulmonary asthma , COPDformer smoker   Pulmonary exam normal       Cardiovascular     Neuro/Psych    GI/Hepatic   Endo/Other  Diabetes mellitus-  Renal/GU      Musculoskeletal   Abdominal (+) + obese,   Peds  Hematology   Anesthesia Other Findings   Reproductive/Obstetrics                          Anesthesia Physical Anesthesia Plan  ASA: III  Anesthesia Plan: General   Post-op Pain Management:    Induction: Intravenous  Airway Management Planned: Oral ETT  Additional Equipment:   Intra-op Plan:   Post-operative Plan: Extubation in OR  Informed Consent: I have reviewed the patients History and Physical, chart, labs and discussed the procedure including the risks, benefits and alternatives for the proposed anesthesia with the patient or authorized representative who has indicated his/her understanding and acceptance.     Plan Discussed with: CRNA and Surgeon  Anesthesia Plan Comments:         Anesthesia Quick Evaluation

## 2011-11-16 NOTE — Progress Notes (Signed)
ANTICOAGULATION CONSULT NOTE - Initial Consult  Pharmacy Consult for coumadin Indication: total hip replacement  Allergies  Allergen Reactions  . Eggs Or Egg-Derived Products Hives and Rash  . Penicillins Hives and Rash  . Quinine Derivatives Hives and Rash    Patient Measurements:   Heparin Dosing Weight:   Vital Signs: Temp: 97 F (36.1 C) (03/19 2112) BP: 122/73 mmHg (03/19 2112) Pulse Rate: 80  (03/19 2112)  Labs: No results found for this basename: HGB:2,HCT:3,PLT:3,APTT:3,LABPROT:3,INR:3,HEPARINUNFRC:3,CREATININE:3,CKTOTAL:3,CKMB:3,TROPONINI:3 in the last 72 hours CrCl is unknown because there is no height on file for the current visit.  Medical History: Past Medical History  Diagnosis Date  . Bowel obstruction   . Left tibial fracture 2007  . Asthma     Allergixc reaction to cats.  . Anemia     occassionally  . Arthritis     all over.  . Diabetes mellitus 2006    Diet and exercise controlled.    Medications:  Scheduled:    . coumadin book   Does not apply Once  . docusate sodium  100 mg Oral BID  . enoxaparin  40 mg Subcutaneous Q24H  . ferrous sulfate  325 mg Oral Q breakfast  . oxyCODONE-acetaminophen      . vancomycin  1,000 mg Intravenous Q12H  . warfarin  5 mg Oral Once  . warfarin   Does not apply Once  . Warfarin - Pharmacist Dosing Inpatient   Does not apply q1800    Assessment: 78 yr old female admitted to hospital for a total hip replacement. INR 0.99 Goal of Therapy:  INR 2-3   Plan:  Will give coumadin 5 mg tonight. Ordered coumadin booklet and video for pt. Daily PT/INR.  Minta Balsam 11/16/2011,9:59 PM

## 2011-11-17 ENCOUNTER — Encounter (HOSPITAL_COMMUNITY): Payer: Self-pay

## 2011-11-17 LAB — CBC
HCT: 27.5 % — ABNORMAL LOW (ref 36.0–46.0)
Hemoglobin: 9 g/dL — ABNORMAL LOW (ref 12.0–15.0)
MCH: 29.5 pg (ref 26.0–34.0)
MCHC: 32.7 g/dL (ref 30.0–36.0)
MCV: 90.2 fL (ref 78.0–100.0)
RDW: 12.8 % (ref 11.5–15.5)

## 2011-11-17 LAB — GLUCOSE, CAPILLARY
Glucose-Capillary: 125 mg/dL — ABNORMAL HIGH (ref 70–99)
Glucose-Capillary: 130 mg/dL — ABNORMAL HIGH (ref 70–99)
Glucose-Capillary: 122 mg/dL — ABNORMAL HIGH (ref 70–99)

## 2011-11-17 LAB — BASIC METABOLIC PANEL
BUN: 10 mg/dL (ref 6–23)
Creatinine, Ser: 0.72 mg/dL (ref 0.50–1.10)
GFR calc Af Amer: >90 mL/min (ref >90)
GFR calc non Af Amer: 80 mL/min — ABNORMAL LOW (ref >90)
Glucose, Bld: 106 mg/dL — ABNORMAL HIGH (ref 70–99)

## 2011-11-17 LAB — PROTIME-INR: INR: 1.14 (ref 0.00–1.49)

## 2011-11-17 MED ORDER — WARFARIN SODIUM 5 MG PO TABS
5.0000 mg | ORAL_TABLET | Freq: Once | ORAL | Status: AC
  Administered 2011-11-17: 5 mg via ORAL
  Filled 2011-11-17: qty 1

## 2011-11-17 MED ORDER — LACTATED RINGERS IV SOLN
INTRAVENOUS | Status: DC
  Administered 2011-11-17: 22:00:00 via INTRAVENOUS

## 2011-11-17 NOTE — Progress Notes (Signed)
Patient without void today since 1130 am prior to surgery.  Unable to void using bedpan, I+O cath at 2315 this evening per MD order.  473mL of urine expressed, fluids continued to be encouraged.

## 2011-11-17 NOTE — Progress Notes (Signed)
Physical Therapy Evaluation Patient Details Name: Kim Mullins MRN: [id number] DOB: [birth year] Today's Date: 11/17/2011  Problem List:  Patient Active Problem List  Diagnoses  . DJD (degenerative joint disease) of hip    Past Medical History:  Past Medical History  Diagnosis Date  . Bowel obstruction   . Left tibial fracture 2007  . Asthma     Allergixc reaction to cats.  . Anemia     occassionally  . Arthritis     all over.  . Diabetes mellitus 2006    Diet and exercise controlled.   Past Surgical History:  Past Surgical History  Procedure Date  . Abdominal hysterectomy 1969  . Dilation and curettage of uterus 1968  . Pilonidal cyst excision 1959  . Corn removal 1999    Bilateral feet  . Bunionectomy 1984    Bilateral  . Tonsillectomy 1942  . Eye surgery 1983    Surgery to fix Retainal detachment, bilateral  . Carpal tunnell 2004    Bilateral    PT Assessment/Plan/Recommendation PT Assessment Clinical Impression Statement: Pt is a 78 y/o female admitted s/p right THA along with the below PT problem list.  Pt would benefit from acute PT to maximize independence and facilitate d/c to SNF for continued therapy. PT Recommendation/Assessment: Patient will need skilled PT in the acute care venue PT Problem List: Decreased strength;Decreased activity tolerance;Decreased balance;Decreased mobility;Decreased knowledge of use of DME;Decreased knowledge of precautions;Pain Barriers to Discharge: Decreased caregiver support PT Therapy Diagnosis : Difficulty walking;Acute pain PT Plan PT Frequency: 7X/week PT Treatment/Interventions: DME instruction;Gait training;Functional mobility training;Therapeutic activities;Therapeutic exercise;Balance training;Patient/family education PT Recommendation Follow Up Recommendations: Skilled nursing facility Equipment Recommended: Defer to next venue PT Goals  Acute Rehab PT Goals PT Goal Formulation: With patient/family Time  For Goal Achievement: 2 weeks Pt will go Supine/Side to Sit: with supervision PT Goal: Supine/Side to Sit - Progress: Goal set today Pt will go Sit to Supine/Side: with supervision PT Goal: Sit to Supine/Side - Progress: Goal set today Pt will go Sit to Stand: with supervision PT Goal: Sit to Stand - Progress: Goal set today Pt will go Stand to Sit: with supervision PT Goal: Stand to Sit - Progress: Goal set today Pt will Ambulate: 51 - 150 feet;with supervision;with least restrictive assistive device PT Goal: Ambulate - Progress: Goal set today Pt will Perform Home Exercise Program: with supervision, verbal cues required/provided PT Goal: Perform Home Exercise Program - Progress: Goal set today  PT Evaluation Precautions/Restrictions  Precautions Precautions: Anterior Hip Precaution Booklet Issued: Yes (comment) Precaution Comments: Anterior Hip Precaution sheet given on evaluation. Required Braces or Orthoses: No Restrictions Weight Bearing Restrictions: Yes RLE Weight Bearing: Weight bearing as tolerated Prior Functioning  Home Living Lives With: Alone Type of Home: House Home Layout: One level Home Access: Level entry Home Adaptive Equipment: Walker - rolling;Straight cane Prior Function Level of Independence: Independent with basic ADLs;Independent with homemaking with ambulation;Independent with gait;Independent with transfers Able to Take Stairs?: Yes Driving: Yes Vocation: Retired Public librarian Arousal/Alertness: Awake/alert Overall Cognitive Status: Appears within functional limits for tasks assessed Orientation Level: Oriented X4 Sensation/Coordination Sensation Light Touch: Appears Intact Stereognosis: Not tested Hot/Cold: Not tested Proprioception: Not tested Coordination Gross Motor Movements are Fluid and Coordinated: Yes Fine Motor Movements are Fluid and Coordinated: Yes Extremity Assessment RUE Assessment RUE Assessment: Within Functional  Limits LUE Assessment LUE Assessment: Within Functional Limits RLE Assessment RLE Assessment: Exceptions to [facility] RLE Strength RLE Overall Strength: Deficits;Due to pain RLE  Overall Strength Comments: 2/5 LLE Assessment LLE Assessment: Within Functional Limits Pain 10/10 in right hip with mobility.  Pt repositioned. Mobility (including Balance) Bed Mobility Bed Mobility: Yes Supine to Sit: 1: +2 Total assist;Patient percentage (comment);HOB flat ((pt=30%)) Supine to Sit Details (indicate cue type and reason): Assist to translate trunk anterior over BOS with max cues for sequence.  Limited by increased pain. Transfers Transfers: Yes Sit to Stand: 1: +2 Total assist;Patient percentage (comment);With upper extremity assist;From bed ((pt=60%)) Sit to Stand Details (indicate cue type and reason): Assist to translate trunk anterior over BOS with cues for hand placement. Stand to Sit: 1: +2 Total assist;Patient percentage (comment);With upper extremity assist;To chair/3-in-1 ((pt=60%)) Stand to Sit Details: Assist to slow descent to chair with cues for hand placement and safety. Ambulation/Gait Ambulation/Gait: Yes Ambulation/Gait Assistance: 1: +2 Total assist;Patient percentage (comment) ((pt=70%)) Ambulation/Gait Assistance Details (indicate cue type and reason): Assist for balance and to off weight right LE due to pain.  Limited in distance by pain.  Cues for sequence. Ambulation Distance (Feet): 5 Feet Assistive device: Rolling walker Gait Pattern: Trunk flexed;Decreased step length - right;Decreased stance time - right Stairs: No Wheelchair Mobility Wheelchair Mobility: No  Posture/Postural Control Posture/Postural Control: No significant limitations Balance Balance Assessed: No Exercise  Total Joint Exercises Ankle Circles/Pumps: AROM;Right;10 reps;Supine Quad Sets: AROM;Right;10 reps;Supine Heel Slides: AROM;Right;10 reps;Supine End of Session PT - End of Session Equipment  Utilized During Treatment: Gait belt Activity Tolerance: Patient tolerated treatment well Patient left: in chair;with call bell in reach;with family/visitor present Nurse Communication: Mobility status for transfers;Mobility status for ambulation General Behavior During Session: [facility] for tasks performed Cognition: [facility] for tasks performed  Cyndia Bent 11/17/2011, 2:29 PM  11/17/2011 Cyndia Bent, PT, DPT [phone]

## 2011-11-17 NOTE — Progress Notes (Signed)
ANTICOAGULATION CONSULT NOTE - Initial Consult  Pharmacy Consult for coumadin Indication: total hip replacement  Allergies  Allergen Reactions  . Eggs Or Egg-Derived Products Hives and Rash  . Penicillins Hives and Rash  . Quinine Derivatives Hives and Rash    Patient Measurements: Height: 5\' 3"  (160 cm) Weight: 187 lb (84.823 kg) IBW/kg (Calculated) : 52.4    Vital Signs: Temp: 99.3 F (37.4 C) (03/20 0540) BP: 99/63 mmHg (03/20 0540) Pulse Rate: 74  (03/20 0540)  Labs:  Basename 11/17/11 0600  HGB 9.0*  HCT 27.5*  PLT 214  APTT --  LABPROT 14.8  INR 1.14  HEPARINUNFRC --  CREATININE 0.72  CKTOTAL --  CKMB --  TROPONINI --   Estimated Creatinine Clearance: 58.9 ml/min (by C-G formula based on Cr of 0.72).  Medical History: Past Medical History  Diagnosis Date  . Bowel obstruction   . Left tibial fracture 2007  . Asthma     Allergixc reaction to cats.  . Anemia     occassionally  . Arthritis     all over.  . Diabetes mellitus 2006    Diet and exercise controlled.    Medications:  Scheduled:     . coumadin book   Does not apply Once  . docusate sodium  100 mg Oral BID  . enoxaparin  40 mg Subcutaneous Q24H  . ferrous sulfate  325 mg Oral Q breakfast  . oxyCODONE-acetaminophen      . vancomycin  1,000 mg Intravenous Q12H  . warfarin  5 mg Oral Once  . warfarin   Does not apply Once  . Warfarin - Pharmacist Dosing Inpatient   Does not apply q1800    Assessment: 78 yr old female admitted to hospital for a total hip replacement. INR 1.14.  On lovenox 40 mg sq q24 hours as well as coumadin Goal of Therapy:  INR 2-3   Plan:  Will give coumadin 5 mg today. Cont daily PT/INR Janaysia Mcleroy Poteet 11/17/2011,10:04 AM

## 2011-11-17 NOTE — Progress Notes (Signed)
Physical Therapy Treatment Note   11/17/11 1545  PT Visit Information  Last PT Received On 11/17/11  Precautions  Precautions Anterior Hip  Precaution Booklet Issued Yes (comment)  Precaution Comments re-educated patient due to patient only recalled 1/3, handout was provided  Restrictions  RLE Weight Bearing WBAT  Bed Mobility  Bed Mobility (only ther ex this date due pt just returning to bed)  Total Joint Exercises  Ankle Circles/Pumps AROM;Both;20 reps;Supine  Quad Sets AROM;Left;20 reps;Supine  Heel Slides AAROM;Right;20 reps;Supine  Gluteal Sets AROM;Both;Supine;Other reps (comment) (20)  Hip ABduction/ADduction AAROM;Left;20 reps;Supine (only ADD due to anterior hip prec)  PT - End of Session  Activity Tolerance Patient tolerated treatment well  Patient left in bed;with call bell in reach;with family/visitor present  General  Behavior During Session [facility] for tasks performed  Cognition [facility] for tasks performed  PT - Assessment/Plan  Comments on Treatment Session Upon arrival RN tech returning patient back to bed s/p sitting up for 6.5 hours. Patient tolerated ther ex well and only reported increased pain with Hip flexion. PT did not go > 50 degrees of R hip flex during ther ex.  PT Plan Discharge plan remains appropriate  Follow Up Recommendations Skilled nursing facility  Equipment Recommended Defer to next venue  Acute Rehab PT Goals  PT Goal: Perform Home Exercise Program - Progress Progressing toward goal    Pain: 6/10 R hip pain with hip flexion, otherwise 3/10  Kittie Plater, PT, DPT Pager #: [phone] Office #: [phone]

## 2011-11-17 NOTE — Progress Notes (Signed)
OT Screen  OT order received. Chart reviewed. Pt to D/C to SNF. Will defer OT eval to SNF. OT signing off. [facility], OTR/L  [phone] 11/17/2011

## 2011-11-17 NOTE — Op Note (Signed)
NAMEMarland Kitchen  Kim, Mullins NO.:  [id number]  MEDICAL RECORD NO.:  [id number]  LOCATION:  [id number]                         FACILITY:  [facility]  PHYSICIAN:  Monico Blitz. Karmine Kauer, M.D.DATE OF BIRTH:  Dec 21, 1929  DATE OF PROCEDURE:  11/16/2011 DATE OF DISCHARGE:                              OPERATIVE REPORT   PREOPERATIVE DIAGNOSIS:  Right hip degenerative joint disease.  POSTOPERATIVE DIAGNOSIS:  Right hip degenerative joint disease.  PROCEDURE:  Right total hip replacement.  ANESTHESIA:  General.  ATTENDING SURGEONS: 1. Monico Blitz Rhona Raider, MD 2. Lind Guest. Ninfa Linden, MD  ASSISTANT:  Roselee Nova, PA  INDICATION FOR PROCEDURE:  The patient is a 78 year old woman with a long history of painful right hip.  This is severely degenerative by x- ray.  She has pain which limits her ability to rest and walk despite oral anti-inflammatories and activity modification.  She is offered a hip replacement operation.  Informed operative consent was obtained after discussion of possible complications including reaction to anesthesia, infection, DVT, dislocation, and death.  SUMMARY OF FINDINGS AND PROCEDURE:  Under general anesthesia, through a straight anterior approach, a right total hip replacement was performed. She had advanced degenerative change but fairly good bone quality.  We addressed her problem with an uncemented DePuy System using a size KA11 Corail femoral stem with a +1 32 metal hip ball.  On the acetabular side, we used a size 50 pinnacle cup in appropriate anteversion and tilt with a neutral +4 polyethylene liner and a hole eliminator.  Dr. Ninfa Linden and I performed the procedure together and Chauncey Cruel assisted throughout.  She was closed primarily and admitted for appropriate postop care.  We used fluoroscopy throughout the case as well to make appropriate intraoperative decisions.  DESCRIPTION OF PROCEDURE:  The patient was taken to operating suite, where  general anesthetic was applied without difficulty.  She was positioned on the Hana table with the appropriate boots placed and all bony prominences padded.  She was supine on the structure.  She was then prepped and draped in normal sterile fashion.  After administration of IV vancomycin, an anterior approach was taken to the right hip.  An incision was made with dissection down to the fascia lata.  The fascia over this muscle was incised longitudinally.  The medial border of this muscle was found and a retractor was placed over the superior aspect of the femoral neck.  Another retractor was then placed over the inferior aspect of the femoral neck.  The capsule was then incised.  This was fairly dry.  The Cobra retractor was then placed inside the capsular incision.  A femoral neck cut was made at the appropriate height with the use of fluoroscopic guidance.  This was done with an oscillating saw and completed with an osteotome.  Femoral head was then removed with mild difficulty.  She had a severely deformed head with complete loss of articular cartilage.  We then removed some labral tissues and soft tissues from around the acetabulum.  A size 43 reamer was then placed. We do not have to medialize very much at all.  We sequentially reamed up to 49 under fluoroscopic guidance and then placed a size 50  pinnacle cup in appropriate anteversion and tilt which was all done under fluoroscopic guidance.  I placed a hole eliminator and a neutral polyethylene liner.  Attention was then turned towards the femur.  The leg was externally rotated 90 degrees followed by extension and adduction.  We then placed the lift device under the femur and gently brought this up into the wound.  I placed a retractor around the medial neck and one over the tip of the trochanter.  Some additional soft tissue releases were performed, and we had obtained excellent exposure of the femoral canal.  We then broached up to  the size 11 Corail stem. A trial reduction was done with the +132 hip ball.  Our leg lengths were judged to be roughly equal.  The leg was stable in tested positions. The trial components removed followed by placement of the KA11 Corail stem in slight anteversion.  This was then capped with the 32, +1 metal hip ball.  The hip was again reduced and leg lengths were felt to be equal.  The wound was irrigated.  Capsular tissues were repaired with Ethibond followed by a repair of the fascia layer with #1 Vicryl in running fashion.  Subcutaneous tissues were reapproximated with 0 and 2- 0 undyed Vicryl and skin was closed with staples.  We placed a dry gauze dressing with tape.  Estimated blood loss and intraoperative fluids obtained from anesthesia records.  DISPOSITION:  The patient was extubated in the operating room and taken to recovery room in stable condition.  She is to be admitted back to the [facility] for appropriate postop care to include perioperative antibiotics and Coumadin plus Lovenox for DVT prophylaxis.     Monico Blitz Rhona Raider, M.D.     PGD/MEDQ  D:  11/16/2011  T:  11/17/2011  Job:  [id number]

## 2011-11-17 NOTE — Progress Notes (Signed)
Subjective: 1 Day Post-Op Procedure(s) (LRB): TOTAL HIP ARTHROPLASTY ANTERIOR APPROACH (Right)  Activity level:  Getting up with pt Diet tolerance:  Eating  Voiding:  1x I/O cath Patient reports pain as 4 on 0-10 scale.    Objective: Vital signs in last 24 hours: Temp:  [97 F (36.1 C)-99.3 F (37.4 C)] 99.3 F (37.4 C) (03/20 0540) Pulse Rate:  [39-84] 74  (03/20 0540) Resp:  [18-32] 18  (03/20 0540) BP: (99-148)/(62-87) 99/63 mmHg (03/20 0540) SpO2:  [95 %-100 %] 99 % (03/20 0540) Weight:  [84.823 kg (187 lb)] 84.823 kg (187 lb) (03/19 2130)  Intake/Output from previous day: 03/19 0701 - 03/20 0700 In: [place] [P.O.:120; I.V.:1650] Out: 950 [Urine:450; Blood:500] Intake/Output this shift:     Basename 11/17/11 0600  HGB 9.0*    Basename 11/17/11 0600  WBC 4.9  RBC 3.05*  HCT 27.5*  PLT 214    Basename 11/17/11 0600  NA 135  K 3.9  CL 102  CO2 26  BUN 10  CREATININE 0.72  GLUCOSE 106*  CALCIUM 7.9*    Basename 11/17/11 0600  LABPT --  INR 1.14    Physical Exam:  Neurologically intact ABD soft Neurovascular intact Sensation intact distally Intact pulses distally Dorsiflexion/Plantar flexion intact No cellulitis present Compartment soft Dressing dry  Assessment/Plan: 1 Day Post-Op Procedure(s) (LRB): TOTAL HIP ARTHROPLASTY ANTERIOR APPROACH (Right) Advance diet Up with therapy Discharge to SNF in a day or two Cont pt WBAT  Lorraina Spring R 11/17/2011, 8:37 AM

## 2011-11-17 NOTE — Clinical Documentation Improvement (Signed)
Anemia Blood Loss Clarification  THIS DOCUMENT IS NOT A PERMANENT PART OF THE MEDICAL RECORD  RESPOND TO THE THIS QUERY, FOLLOW THE INSTRUCTIONS BELOW:  1. If needed, update documentation for the patient's encounter via the notes activity.  2. Access this query again and click edit on the In [employer].  3. After updating, or not, click F2 to complete all highlighted (required) fields concerning your review. Select "additional documentation in the medical record" OR "no additional documentation provided".  4. Click Sign note button.  5. The deficiency will fall out of your In Basket *Please let us know if you are not able to complete this workflow by phone or e-mail (listed below).        11/17/11  Dear Langley Adie Rolley Sims  In an effort to better capture your patient's severity of illness, reflect appropriate length of stay and utilization of resources, a review of the patient medical record has revealed the following indicators.    Based on your clinical judgment, please clarify and document in a progress note and/or discharge summary the clinical condition associated with the following supporting information:  In responding to this query please exercise your independent judgment.  The fact that a query is asked, does not imply that any particular answer is desired or expected.   Possible Clinical Conditions?   " Expected Acute Blood Loss Anemia  " Acute Blood Loss Anemia  " Acute on chronic blood loss anemia  " Other Condition  " Cannot Clinically Determine    Supporting Information:  Diagnostics: Pre-OP H&H on 3/12:    11.9/36.3 Post OP H&H on 3/20:     9.0/27.5 Medications:  3/19:  Ferrous sulfate 325mg  po w/breakfast.  Reviewed: No additional documentation noted.                        Thank You,  Theron Arista,  Clinical Documentation Specialist:  Pager: [phone]

## 2011-11-18 LAB — CBC
HCT: 25.5 % — ABNORMAL LOW (ref 36.0–46.0)
Hemoglobin: 8.3 g/dL — ABNORMAL LOW (ref 12.0–15.0)
MCH: 29.1 pg (ref 26.0–34.0)
MCHC: 32.5 g/dL (ref 30.0–36.0)
RDW: 12.6 % (ref 11.5–15.5)

## 2011-11-18 LAB — GLUCOSE, CAPILLARY
Glucose-Capillary: 110 mg/dL — ABNORMAL HIGH (ref 70–99)
Glucose-Capillary: 125 mg/dL — ABNORMAL HIGH (ref 70–99)

## 2011-11-18 MED ORDER — WARFARIN SODIUM 5 MG PO TABS
5.0000 mg | ORAL_TABLET | Freq: Once | ORAL | Status: AC
  Administered 2011-11-18: 5 mg via ORAL
  Filled 2011-11-18: qty 1

## 2011-11-18 NOTE — Progress Notes (Signed)
Physical Therapy Treatment Patient Details Name: Kim Mullins MRN: [id number] DOB: [birth year] Today's Date: 11/18/2011  PT Assessment/Plan  PT - Assessment/Plan Comments on Treatment Session: Pt admitted s/p right THA and is progressing.  Pt is limited by decreased endurance and pain.  However, pt still able to increase ambulation distance/independence today. PT Plan: Discharge plan remains appropriate;Frequency remains appropriate PT Frequency: 7X/week Follow Up Recommendations: Skilled nursing facility Equipment Recommended: Defer to next venue PT Goals  Acute Rehab PT Goals PT Goal Formulation: With patient/family Time For Goal Achievement: 2 weeks PT Goal: Supine/Side to Sit - Progress: Progressing toward goal PT Goal: Sit to Stand - Progress: Progressing toward goal PT Goal: Stand to Sit - Progress: Progressing toward goal PT Goal: Ambulate - Progress: Progressing toward goal  PT Treatment Precautions/Restrictions  Precautions Precautions: Anterior Hip Precaution Booklet Issued: No Precaution Comments: Pt able to recall 3/3 anterior hip precautions. Required Braces or Orthoses: No Restrictions Weight Bearing Restrictions: Yes RLE Weight Bearing: Weight bearing as tolerated Pain 7/10 in right hip with treatment.  Pt repositioned. Mobility (including Balance) Bed Mobility Bed Mobility: Yes Supine to Sit: 4: Min assist;HOB flat Supine to Sit Details (indicate cue type and reason): Assist for right LE only due to pain. Transfers Transfers: Yes Sit to Stand: 4: Min assist;With upper extremity assist;From bed;From chair/3-in-1 (2 trials.) Sit to Stand Details (indicate cue type and reason): Assist to translate trunk anterior over BOS with cues for hand placement. Stand to Sit: 4: Min assist;With upper extremity assist;To chair/3-in-1 (2 trials.) Stand to Sit Details: Assist to control descent to chair with cues for hand placement. Ambulation/Gait Ambulation/Gait:  Yes Ambulation/Gait Assistance: 4: Min assist Ambulation/Gait Assistance Details (indicate cue type and reason): Assist for balance with cues for sequence with increased step length bilateral LEs. Ambulation Distance (Feet): 20 Feet Assistive device: Rolling walker Gait Pattern: Step-to pattern;Decreased step length - right;Decreased step length - left;Trunk flexed Stairs: No Wheelchair Mobility Wheelchair Mobility: No  Posture/Postural Control Posture/Postural Control: No significant limitations Balance Balance Assessed: No End of Session PT - End of Session Equipment Utilized During Treatment: Gait belt Activity Tolerance: Patient tolerated treatment well Patient left: in chair;with call bell in reach Nurse Communication: Mobility status for transfers;Mobility status for ambulation General Behavior During Session: [facility] for tasks performed Cognition: [facility] for tasks performed  Cyndia Bent 11/18/2011, 2:07 PM  11/18/2011 Cyndia Bent, PT, DPT [phone]

## 2011-11-18 NOTE — Progress Notes (Signed)
Subjective: 2 Days Post-Op Procedure(s) (LRB): TOTAL HIP ARTHROPLASTY ANTERIOR APPROACH (Right)  Activity level:  Walking in room Diet tolerance:  eating Voiding:  ok Patient reports pain as 2 on 0-10 scale.    Objective: Vital signs in last 24 hours: Temp:  [98.2 F (36.8 C)-99.5 F (37.5 C)] 98.2 F (36.8 C) (03/21 1100) Pulse Rate:  [78-101] 78  (03/21 1100) Resp:  [16-20] 20  (03/21 1100) BP: (85-124)/(51-67) 97/51 mmHg (03/21 1100) SpO2:  [97 %-100 %] 100 % (03/21 1100)  Intake/Output from previous day: 03/20 0701 - 03/21 0700 In: 535 [P.O.:535] Out: 325 [Urine:325] Intake/Output this shift:     Basename 11/18/11 0700 11/17/11 0600  HGB 8.3* 9.0*    Basename 11/18/11 0700 11/17/11 0600  WBC 8.3 4.9  RBC 2.85* 3.05*  HCT 25.5* 27.5*  PLT 191 214    Basename 11/17/11 0600  NA 135  K 3.9  CL 102  CO2 26  BUN 10  CREATININE 0.72  GLUCOSE 106*  CALCIUM 7.9*    Basename 11/18/11 0700 11/17/11 0600  LABPT -- --  INR 1.39 1.14    Physical Exam:  Neurologically intact ABD soft Neurovascular intact Sensation intact distally Intact pulses distally Dorsiflexion/Plantar flexion intact Incision: dressing C/D/I No cellulitis present Compartment soft  Assessment/Plan: 2 Days Post-Op Procedure(s) (LRB): TOTAL HIP ARTHROPLASTY ANTERIOR APPROACH (Right) Advance diet Up with therapy D/C IV fluids Plan for discharge tomorrow to SNF  Kim Mullins R 11/18/2011, 12:46 PM

## 2011-11-18 NOTE — Progress Notes (Signed)
ANTICOAGULATION CONSULT NOTE - Initial Consult  Pharmacy Consult for coumadin Indication: VTE prophylaxis s/p R THR  Allergies  Allergen Reactions  . Eggs Or Egg-Derived Products Hives and Rash  . Penicillins Hives and Rash  . Quinine Derivatives Hives and Rash    Patient Measurements: Height: 5\' 3"  (160 cm) Weight: 187 lb (84.823 kg) IBW/kg (Calculated) : 52.4    Vital Signs: Temp: 98.2 F (36.8 C) (03/21 1100) Temp src: Oral (03/21 1100) BP: 97/51 mmHg (03/21 1100) Pulse Rate: 78  (03/21 1100)  Labs:  Basename 11/18/11 0700 11/17/11 0600  HGB 8.3* 9.0*  HCT 25.5* 27.5*  PLT 191 214  APTT -- --  LABPROT 17.3* 14.8  INR 1.39 1.14  HEPARINUNFRC -- --  CREATININE -- 0.72  CKTOTAL -- --  CKMB -- --  TROPONINI -- --   Estimated Creatinine Clearance: 58.9 ml/min (by C-G formula based on Cr of 0.72).  Assessment: INR remains subtherapeutic but moving nicely on 5mg  daily. Also on lovenox 40 mg sq q24 hours until INR >/= 1.8. No bleeding noted but Hgb trending down.  Goal of Therapy:  INR 2-3   Plan:  Will give coumadin 5 mg again today Cont daily PT/INR  Sherlon Handing, PharmD, BCPS Clinical pharmacist, pager [phone] 11/18/2011,11:41 AM

## 2011-11-18 NOTE — Progress Notes (Signed)
   CARE MANAGEMENT NOTE 11/18/2011  Patient:  LEATHA, BUCKMASTER   Account Number:  [id number]  Date Initiated:  11/18/2011  Documentation initiated by:  GRAVES-BIGELOW,Mansel Strother  Subjective/Objective Assessment:   Pt admitted with Right hip pain. S/p TOTAL HIP ARTHROPLASTY. Pt is following pt and recommendations are for SNF. Pt is agreeable and CSW is working with pt.     Action/Plan:   No further needs assessed by CM at this time.   Anticipated DC Date:  11/20/2011   Anticipated DC Plan:  SKILLED NURSING FACILITY  In-house referral  Clinical Social Worker      DC Planning Services  CM consult      Choice offered to / List presented to:             Status of service:  Completed, signed off Medicare Important Message given?   (If response is "NO", the following Medicare IM given date fields will be blank) Date Medicare IM given:   Date Additional Medicare IM given:    Discharge Disposition:  SKILLED NURSING FACILITY  Per UR Regulation:    If discussed at Long Length of Stay Meetings, dates discussed:    Comments:

## 2011-11-19 LAB — CBC
HCT: 26.3 % — ABNORMAL LOW (ref 36.0–46.0)
MCH: 29.5 pg (ref 26.0–34.0)
MCHC: 32.7 g/dL (ref 30.0–36.0)
MCV: 90.1 fL (ref 78.0–100.0)
RDW: 12.6 % (ref 11.5–15.5)
WBC: 9.5 10*3/uL (ref 4.0–10.5)

## 2011-11-19 LAB — GLUCOSE, CAPILLARY
Glucose-Capillary: 140 mg/dL — ABNORMAL HIGH (ref 70–99)
Glucose-Capillary: 120 mg/dL — ABNORMAL HIGH (ref 70–99)

## 2011-11-19 MED ORDER — MAGNESIUM CITRATE PO SOLN
1.0000 | Freq: Once | ORAL | Status: AC
  Administered 2011-11-19: 1 via ORAL
  Filled 2011-11-19: qty 296

## 2011-11-19 MED ORDER — WARFARIN SODIUM 5 MG PO TABS
5.0000 mg | ORAL_TABLET | Freq: Once | ORAL | Status: AC
  Administered 2011-11-19: 5 mg via ORAL
  Filled 2011-11-19: qty 1

## 2011-11-19 MED ORDER — OXYCODONE-ACETAMINOPHEN 5-325 MG PO TABS: 1.0000 | ORAL_TABLET | ORAL | Status: AC | PRN

## 2011-11-19 MED ORDER — WARFARIN SODIUM 5 MG PO TABS: ORAL_TABLET | ORAL | Status: DC

## 2011-11-19 MED ORDER — ENOXAPARIN SODIUM 40 MG/0.4ML ~~LOC~~ SOLN: SUBCUTANEOUS | Status: DC

## 2011-11-19 MED ORDER — METHOCARBAMOL 500 MG PO TABS: 500.0000 mg | ORAL_TABLET | Freq: Four times a day (QID) | ORAL | Status: AC | PRN

## 2011-11-19 MED ORDER — BISACODYL 10 MG RE SUPP
10.0000 mg | Freq: Every day | RECTAL | Status: DC | PRN
  Administered 2011-11-19: 10 mg via RECTAL

## 2011-11-19 NOTE — Discharge Summary (Signed)
Patient ID: Kim Mullins MRN: [id number] DOB/AGE: Mar 22, 1930 78 y.o.  Admit date: 11/16/2011 Discharge date: 11/22/2011  Admission Diagnoses:  Principal Problem:  *DJD (degenerative joint disease) of hip   Discharge Diagnoses:  Same  Past Medical History  Diagnosis Date  . Bowel obstruction   . Left tibial fracture 2007  . Asthma     Allergixc reaction to cats.  . Anemia     occassionally  . Arthritis     all over.  . Diabetes mellitus 2006    Diet and exercise controlled.    Surgeries: Procedure(s): TOTAL HIP ARTHROPLASTY ANTERIOR APPROACH on 11/16/2011   Consultants:    Discharged Condition: Improved  Hospital Course: Kim Mullins is an 78 y.o. female who was admitted 11/16/2011 for operative treatment ofDJD (degenerative joint disease) of hip. Patient has severe unremitting pain that affects sleep, daily activities, and work/hobbies. After pre-op clearance the patient was taken to the operating room on 11/16/2011 and underwent  Procedure(s): TOTAL HIP ARTHROPLASTY ANTERIOR APPROACH.    Patient was given perioperative antibiotics: Anti-infectives     Start     Dose/Rate Route Frequency Ordered Stop   11/16/11 2200   vancomycin (VANCOCIN) IVPB 1000 mg/200 mL premix        1,000 mg 200 mL/hr over 60 Minutes Intravenous Every 12 hours 11/16/11 2118 11/16/11 2316           Patient was given sequential compression devices, early ambulation, and chemoprophylaxis to prevent DVT. Will continue Lovenox till INR over 1.8. INR from 11/22/11 was 2.13.  Patient benefited maximally from hospital stay and there were no complications.    Recent vital signs: Patient Vitals for the past 24 hrs:  BP Temp Temp src Pulse Resp SpO2  11/19/11 0628 126/51 mmHg 100 F (37.8 C) - 88  18  96 %  12-09-11 2151 112/54 mmHg 99.4 F (37.4 C) - 88  18  93 %  12/09/11 1300 99/57 mmHg 98.9 F (37.2 C) - 95  20  97 %  Dec 09, 2011 1100 97/51 mmHg 98.2 F (36.8 C) Oral 78  20  100 %      Recent laboratory studies:  Basename 11/19/11 0615 12/09/2011 0700 11/17/11 0600  WBC 9.5 8.3 --  HGB 8.6* 8.3* --  HCT 26.3* 25.5* --  PLT 228 191 --  NA -- -- 135  K -- -- 3.9  CL -- -- 102  CO2 -- -- 26  BUN -- -- 10  CREATININE -- -- 0.72  GLUCOSE -- -- 106*  INR 1.45 1.39 --  CALCIUM -- -- 7.9*     Discharge Medications:   Medication List  As of 11/19/2011 10:31 AM   STOP taking these medications         acetaminophen 500 MG tablet      aspirin 81 MG chewable tablet      HYDROcodone-acetaminophen 5-500 MG per tablet      meloxicam 15 MG tablet         TAKE these medications         bisacodyl 5 MG EC tablet   Commonly known as: DULCOLAX   Take 5-10 mg by mouth daily as needed. For constipation      calcium carbonate 1250 MG chewable tablet   Commonly known as: OS-CAL   Chew 1 tablet by mouth daily.      enoxaparin 40 MG/0.4ML injection   Commonly known as: LOVENOX   Continue till INR 1.8 or greater  IRON PO   Take 1 tablet by mouth daily.      methocarbamol 500 MG tablet   Commonly known as: ROBAXIN   Take 1 tablet (500 mg total) by mouth every 6 (six) hours as needed.      oxyCODONE-acetaminophen 5-325 MG per tablet   Commonly known as: PERCOCET   Take 1-2 tablets by mouth every 4 (four) hours as needed.      warfarin 5 MG tablet   Commonly known as: COUMADIN   As directed for 4 weeks  INR2.0-3.0            Diagnostic Studies: Dg Chest 2 View  11/09/2011  *RADIOLOGY REPORT*  Clinical Data: Right hip replacement preop  CHEST - 2 VIEW  Comparison: None.  Findings: Mild cardiomegaly.  Tortuous thoracic aorta.  Lungs are clear.  No effusion.  Advanced degenerative changes in bilateral shoulders.  IMPRESSION:  1.  Mild cardiomegaly.  Original Report Authenticated By: Trecia Rogers, M.D.   Dg Hip Operative Right  11/16/2011  *RADIOLOGY REPORT*  Clinical Data: Hip osteoarthritis.  OPERATIVE RIGHT HIP  Comparison: 07/27/2010  radiographs.  Findings: Two spot fluoroscopic images of the right hip demonstrate interval right total hip arthroplasty.  The hardware appears well positioned.  There is no evidence of acute fracture or dislocation.  IMPRESSION: Intraoperative views during right total hip arthroplasty.  No demonstrated complication.  Original Report Authenticated By: Vivia Ewing, M.D.   Dg Pelvis Portable  11/17/2011  *RADIOLOGY REPORT*  Clinical Data: 78 year old female status post right hip replacement.  PORTABLE PELVIS  Comparison: 2106 hours the same day.  Preoperative study 11/02/2011.  Findings: AP view of the pelvis at 2101 hours.  Sequelae of right total hip arthroplasty.  Components appear intact and normally aligned.  No unexpected osseous changes. Stable left hip joint space narrowing.  IMPRESSION: Right total hip arthroplasty with no adverse features.  Original Report Authenticated By: Randall An, M.D.   Dg Hip Portable 1 View Right  11/17/2011  *RADIOLOGY REPORT*  Clinical Data: 78 year old female status post right hip replacement.  PORTABLE RIGHT HIP - 1 VIEW  Comparison: 1353 hours the same day and earlier.  Findings: Portable AP and cross-table lateral views of the right hip at 2106 hours.  Sequelae of right total hip arthroplasty. Components appear intact and normally aligned.  No unexpected osseous changes.  Postoperative changes to the overlying soft tissues.  IMPRESSION: Right total hip arthroplasty with no adverse features.  Original Report Authenticated By: Randall An, M.D.   Dg Abd 2 Views  11/02/2011  *RADIOLOGY REPORT*  Clinical Data: Diabetes, constipation  ABDOMEN - 2 VIEW  Comparison: None  Findings: Nonobstructive bowel gas pattern. No bowel dilatation or bowel wall thickening. Mild stool retention within the rectum and distal sigmoid colon. Bones diffusely demineralized. Advanced degenerative changes right hip joint. No urinary tract calcification.  IMPRESSION: Nonobstructive bowel  gas pattern.  Original Report Authenticated By: Burnetta Sabin, M.D.    Disposition: Discharge to SNF.  Discharge Orders    Future Orders Please Complete By Expires   Diet - low sodium heart healthy      Call MD / Call 911      Comments:   If you experience chest pain or shortness of breath, CALL 911 and be transported to the hospital emergency room.  If you develope a fever above 101 F, pus (white drainage) or increased drainage or redness at the wound, or calf pain, call  your surgeon's office.   Constipation Prevention      Comments:   Drink plenty of fluids.  Prune juice may be helpful.  You may use a stool softener, such as Colace (over the counter) 100 mg twice a day.  Use MiraLax (over the counter) for constipation as needed.   Increase activity slowly as tolerated      Weight Bearing as taught in Physical Therapy      Comments:   Use a walker or crutches as instructed.   Apply dressing         Follow-up Information    Follow up with DALLDORF,PETER G, MD. Call in 12 days.   Contact information:   [place] [phone]           Signed: Dione Housekeeper 11/19/2011, 10:31 AM

## 2011-11-19 NOTE — Progress Notes (Signed)
[place] for coumadin Indication: VTE prophylaxis s/p R THR  Allergies  Allergen Reactions  . Eggs Or Egg-Derived Products Hives and Rash  . Penicillins Hives and Rash  . Quinine Derivatives Hives and Rash    Patient Measurements: Height: 5\' 3"  (160 cm) Weight: 187 lb (84.823 kg) IBW/kg (Calculated) : 52.4    Vital Signs: Temp: 100 F (37.8 C) (03/22 0628) BP: 126/51 mmHg (03/22 0628) Pulse Rate: 88  (03/22 0628)  Labs:  Basename 11/19/11 0615 11/18/11 0700 11/17/11 0600  HGB 8.6* 8.3* --  HCT 26.3* 25.5* 27.5*  PLT 228 191 214  APTT -- -- --  LABPROT 17.9* 17.3* 14.8  INR 1.45 1.39 1.14  HEPARINUNFRC -- -- --  CREATININE -- -- 0.72  CKTOTAL -- -- --  CKMB -- -- --  TROPONINI -- -- --   Estimated Creatinine Clearance: 58.9 ml/min (by C-G formula based on Cr of 0.72).  Assessment: INR remains subtherapeutic but moving nicely on 5mg  daily. Also on lovenox 40 mg sq q24 hours until INR >/= 1.8. No bleeding noted.  Goal of Therapy:  INR 2-3   Plan:  Will give coumadin 5 mg again today Cont daily PT/INR  Excell Seltzer, PharmD Clinical pharmacist, pager [phone] 11/19/2011,10:01 AM

## 2011-11-19 NOTE — Progress Notes (Signed)
Physical Therapy Treatment Patient Details Name: Kim Mullins MRN: [id number] DOB: 1929-09-22 Today's Date: 11/19/2011  PT Assessment/Plan  PT - Assessment/Plan Comments on Treatment Session: Pt admitted s/p right THA and is progressing.  Pt still, however, requiring assistance for mobility due to weakness and fatigue.  Pt lives alone and does not currently have assistance at home.  Feel pt would benefit from SNF stay to increase independence and endurance. PT Plan: Discharge plan remains appropriate;Frequency remains appropriate PT Frequency: 7X/week Follow Up Recommendations: Skilled nursing facility Equipment Recommended: Defer to next venue PT Goals  Acute Rehab PT Goals PT Goal Formulation: With patient/family Time For Goal Achievement: 2 weeks PT Goal: Sit to Stand - Progress: Progressing toward goal PT Goal: Stand to Sit - Progress: Progressing toward goal PT Goal: Ambulate - Progress: Progressing toward goal  PT Treatment Precautions/Restrictions  Precautions Precautions: Anterior Hip Precaution Booklet Issued: No Precaution Comments: Pt able to recall 3/3 anterior hip precautions. Required Braces or Orthoses: No Restrictions Weight Bearing Restrictions: Yes RLE Weight Bearing: Weight bearing as tolerated Mobility (including Balance) Bed Mobility Bed Mobility: No Transfers Transfers: Yes Sit to Stand: 4: Min assist;With upper extremity assist;From chair/3-in-1 Sit to Stand Details (indicate cue type and reason): Assist for balance with cues for hand placement and safety. Stand to Sit: 4: Min assist;With upper extremity assist;To chair/3-in-1 Stand to Sit Details: Assist to slow descent of trunk to chair with cues for safest hand placement. Ambulation/Gait Ambulation/Gait: Yes Ambulation/Gait Assistance: 4: Min assist Ambulation/Gait Assistance Details (indicate cue type and reason): Assist for balance and to off weight right LE due to weakness. Ambulation  Distance (Feet): 50 Feet Assistive device: Rolling walker Gait Pattern: Step-to pattern;Decreased step length - right;Decreased step length - left;Trunk flexed;Decreased stance time - right Stairs: No Wheelchair Mobility Wheelchair Mobility: No  Posture/Postural Control Posture/Postural Control: No significant limitations Balance Balance Assessed: No End of Session PT - End of Session Equipment Utilized During Treatment: Gait belt Activity Tolerance: Patient tolerated treatment well Patient left: in chair;with call bell in reach;with family/visitor present Nurse Communication: Mobility status for transfers;Mobility status for ambulation General Behavior During Session: [facility] for tasks performed Cognition: [facility] for tasks performed  Cyndia Bent 11/19/2011, 11:25 AM  11/19/2011 Cyndia Bent, PT, DPT [phone]

## 2011-11-19 NOTE — Progress Notes (Signed)
Clinical Social Work-CSW contacted [facility]-they do not have bed available until Monday and would happy to accept pt Monday-CSW left message for PA to notify and will continue to follow-Korayma Hagwood-MSW, [phone]

## 2011-11-19 NOTE — Progress Notes (Signed)
Subjective: 3 Days Post-Op Procedure(s) (LRB): TOTAL HIP ARTHROPLASTY ANTERIOR APPROACH (Right)  Activity level:  walking Diet tolerance:  eating Voiding:  ok Patient reports pain as 2 on 0-10 scale.    Objective: Vital signs in last 24 hours: Temp:  [98.2 F (36.8 C)-100 F (37.8 C)] 100 F (37.8 C) (03/22 0628) Pulse Rate:  [78-95] 88  (03/22 0628) Resp:  [18-20] 18  (03/22 0628) BP: (97-126)/(51-57) 126/51 mmHg (03/22 0628) SpO2:  [93 %-100 %] 96 % (03/22 0628)  Intake/Output from previous day: 03/21 0701 - 03/22 0700 In: 200 [P.O.:200] Out: -  Intake/Output this shift:     Basename 11/19/11 0615 11/18/11 0700 11/17/11 0600  HGB 8.6* 8.3* 9.0*    Basename 11/19/11 0615 11/18/11 0700  WBC 9.5 8.3  RBC 2.92* 2.85*  HCT 26.3* 25.5*  PLT 228 191    Basename 11/17/11 0600  NA 135  K 3.9  CL 102  CO2 26  BUN 10  CREATININE 0.72  GLUCOSE 106*  CALCIUM 7.9*    Basename 11/19/11 0615 11/18/11 0700  LABPT -- --  INR 1.45 1.39    Physical Exam:  Neurologically intact ABD soft Neurovascular intact Sensation intact distally Intact pulses distally Dorsiflexion/Plantar flexion intact No cellulitis present Compartment soft  Assessment/Plan: 3 Days Post-Op Procedure(s) (LRB): TOTAL HIP ARTHROPLASTY ANTERIOR APPROACH (Right) Advance diet Up with therapy Discharge to SNF  Xoe Hoe R 11/19/2011, 10:21 AM

## 2011-11-19 NOTE — Progress Notes (Signed)
Subjective: 3 Days Post-Op Procedure(s) (LRB): TOTAL HIP ARTHROPLASTY ANTERIOR APPROACH (Right)  Pt is ready for SNF but bed is not. Will have to keep till Monday. Going home is not an option ,no consistent help.  Activity level:  walking Diet tolerance:  eating Voiding:  ok Patient reports pain as 1 on 0-10 scale.    Objective: Vital signs in last 24 hours: Temp:  [98.9 F (37.2 C)-100 F (37.8 C)] 100 F (37.8 C) (03/22 0628) Pulse Rate:  [88-95] 88  (03/22 0628) Resp:  [18-20] 18  (03/22 0628) BP: (99-126)/(51-57) 126/51 mmHg (03/22 0628) SpO2:  [93 %-97 %] 96 % (03/22 0628)  Intake/Output from previous day: 03/21 0701 - 03/22 0700 In: 200 [P.O.:200] Out: -  Intake/Output this shift:     Basename 11/19/11 0615 11/18/11 0700 11/17/11 0600  HGB 8.6* 8.3* 9.0*    Basename 11/19/11 0615 11/18/11 0700  WBC 9.5 8.3  RBC 2.92* 2.85*  HCT 26.3* 25.5*  PLT 228 191    Basename 11/17/11 0600  NA 135  K 3.9  CL 102  CO2 26  BUN 10  CREATININE 0.72  GLUCOSE 106*  CALCIUM 7.9*    Basename 11/19/11 0615 11/18/11 0700  LABPT -- --  INR 1.45 1.39    Physical Exam:  Neurologically intact ABD soft Neurovascular intact Right hip wound is wnl nurse to change dressing today.  Assessment/Plan: 3 Days Post-Op Procedure(s) (LRB): TOTAL HIP ARTHROPLASTY ANTERIOR APPROACH (Right) Advance diet Up with therapy D/C IV fluids Discharge to SNF on Monday when bed is ready. Cont lovenox till INR over 1.8 Cont WBAT with therapy Dressing to be changed today. Dr. Tamera Punt to follow over the weekend.  Retina Bernardy R 11/19/2011, 11:59 AM

## 2011-11-19 NOTE — Discharge Instructions (Signed)
Can be WBAT No hip precations May change dressing PRN

## 2011-11-20 LAB — PROTIME-INR: Prothrombin Time: 19.6 seconds — ABNORMAL HIGH (ref 11.6–15.2)

## 2011-11-20 LAB — GLUCOSE, CAPILLARY
Glucose-Capillary: 117 mg/dL — ABNORMAL HIGH (ref 70–99)
Glucose-Capillary: 108 mg/dL — ABNORMAL HIGH (ref 70–99)

## 2011-11-20 MED ORDER — WARFARIN SODIUM 5 MG PO TABS
5.0000 mg | ORAL_TABLET | Freq: Once | ORAL | Status: AC
  Administered 2011-11-20: 5 mg via ORAL
  Filled 2011-11-20: qty 1

## 2011-11-20 NOTE — Progress Notes (Signed)
PT Treatment Note   11/20/11 1416  PT Visit Information  Last PT Received On 11/20/11  Precautions  Precautions Anterior Hip  Precaution Booklet Issued No  Precaution Comments Pt able to recall 3/3 anterior hip precautions.  Required Braces or Orthoses No  Restrictions  Weight Bearing Restrictions Yes  RLE Weight Bearing WBAT  Bed Mobility  Bed Mobility No  Transfers  Transfers No  Ambulation/Gait  Ambulation/Gait No  Stairs No  Wheelchair Mobility  Wheelchair Mobility No  Posture/Postural Control  Posture/Postural Control No significant limitations  Balance  Balance Assessed No  Exercises  Exercises Total Joint  Total Joint Exercises  Ankle Circles/Pumps AROM;Both;20 reps;Supine  Quad Sets AROM;Right;20 reps;Supine  Heel Slides AAROM;Right;20 reps;Supine  Short Arc Quad AROM;Right;Other reps (comment);Supine (20 reps)  Straight Leg Raises AAROM;Right;20 reps;Supine  PT - End of Session  Activity Tolerance Patient tolerated treatment well  Patient left in bed;with call bell in reach  General  Behavior During Session [facility] for tasks performed  Cognition [facility] for tasks performed  PT - Assessment/Plan  Comments on Treatment Session Pt admitted s/p right THA and continues to tolerate therapy including therapeutic exercises this pm.  Pt very motivated to do well.  PT Plan Discharge plan remains appropriate;Frequency remains appropriate  PT Frequency 7X/week  Follow Up Recommendations Skilled nursing facility  Equipment Recommended Defer to next venue  Acute Rehab PT Goals  PT Goal Formulation With patient/family  Time For Goal Achievement 2 weeks  PT Goal: Perform Home Exercise Program - Progress Progressing toward goal   Pain 0/10 with treatment.  11/20/2011 Cyndia Bent, PT, DPT [phone]

## 2011-11-20 NOTE — Progress Notes (Signed)
[place] for coumadin Indication: VTE prophylaxis s/p R THR  Allergies  Allergen Reactions  . Eggs Or Egg-Derived Products Hives and Rash  . Penicillins Hives and Rash  . Quinine Derivatives Hives and Rash    Patient Measurements: Height: 5\' 3"  (160 cm) Weight: 186 lb 15.6 oz (84.81 kg) IBW/kg (Calculated) : 52.4    Vital Signs: Temp: 99.1 F (37.3 C) (03/23 0727) BP: 94/64 mmHg (03/23 0727) Pulse Rate: 90  (03/23 0727)  Labs:  Basename 11/20/11 0500 11/19/11 0615 11/18/11 0700  HGB -- 8.6* 8.3*  HCT -- 26.3* 25.5*  PLT -- 228 191  APTT -- -- --  LABPROT 19.6* 17.9* 17.3*  INR 1.63* 1.45 1.39  HEPARINUNFRC -- -- --  CREATININE -- -- --  CKTOTAL -- -- --  CKMB -- -- --  TROPONINI -- -- --   Estimated Creatinine Clearance: 58.9 ml/min (by C-G formula based on Cr of 0.72).  Assessment: INR remains subtherapeutic but moving nicely on 5mg  daily. Also on lovenox 40 mg sq q24 hours until INR >/= 1.8. No bleeding noted.  Goal of Therapy:  INR 2-3   Plan:  Will give coumadin 5 mg again today Cont daily PT/INR  Excell Seltzer, PharmD Clinical pharmacist, pager [phone] 11/20/2011,12:07 PM

## 2011-11-20 NOTE — Progress Notes (Signed)
PATIENT ID: Kim Mullins  MRN: [id number]  DOB/AGE:  1929/11/16 / 78 y.o.  4 Days Post-Op Procedure(s) (LRB): TOTAL HIP ARTHROPLASTY ANTERIOR APPROACH (Right)  Subjective: Pain is mild.  No c/o chest pain or SOB.   Up walking halls with PT.  Awaiting SNF.   Objective: Vital signs in last 24 hours: Temp:  [98.8 F (37.1 C)-99.1 F (37.3 C)] 99.1 F (37.3 C) (03/23 0727) Pulse Rate:  [88-90] 90  (03/23 0727) Resp:  [16-17] 16  (03/23 0727) BP: (94-97)/(55-64) 94/64 mmHg (03/23 0727) SpO2:  [100 %] 100 % (03/23 0727)  Intake/Output from previous day: 03/22 0701 - 03/23 0700 In: 590 [P.O.:590] Out: -  Intake/Output this shift:     Basename 11/19/11 0615 11/18/11 0700  HGB 8.6* 8.3*    Basename 11/19/11 0615 11/18/11 0700  WBC 9.5 8.3  RBC 2.92* 2.85*  HCT 26.3* 25.5*  PLT 228 191   No results found for this basename: NA:2,K:2,CL:2,CO2:2,BUN:2,CREATININE:2,GLUCOSE:2,CALCIUM:2 in the last 72 hours  Basename 11/20/11 0500 11/19/11 0615  LABPT -- --  INR 1.63* 1.45    Physical Exam: Sensation intact distally Dorsiflexion/Plantar flexion intact ambulating with walker  Assessment/Plan: 4 Days Post-Op Procedure(s) (LRB): TOTAL HIP ARTHROPLASTY ANTERIOR APPROACH (Right)   Up with therapy Weight Bearing as Tolerated (WBAT)  VTE prophylaxis: pharmacologic prophylaxis (with any of the following: warfarin adjusted-dose) SNF Monday.   Nita Sells 11/20/2011, 8:58 AM

## 2011-11-20 NOTE — Progress Notes (Signed)
Physical Therapy Treatment Patient Details Name: Kim Mullins MRN: [id number] DOB: 01-10-1930 Today's Date: 11/20/2011  PT Assessment/Plan  PT - Assessment/Plan Comments on Treatment Session: Pt admitted s/p right THA and continues to increase strength/independence daily.  Pt able to improve quality of gait today with encouragement and cues.  Would still benefit from SNF stay to increase independence and safety. PT Plan: Discharge plan remains appropriate;Frequency remains appropriate PT Frequency: 7X/week Follow Up Recommendations: Skilled nursing facility Equipment Recommended: Defer to next venue PT Goals  Acute Rehab PT Goals PT Goal Formulation: With patient/family Time For Goal Achievement: 2 weeks PT Goal: Sit to Stand - Progress: Progressing toward goal PT Goal: Stand to Sit - Progress: Progressing toward goal PT Goal: Ambulate - Progress: Progressing toward goal  PT Treatment Precautions/Restrictions  Precautions Precautions: Anterior Hip Precaution Booklet Issued: No Precaution Comments: Pt able to recall 3/3 anterior hip precautions. Required Braces or Orthoses: No Restrictions Weight Bearing Restrictions: Yes RLE Weight Bearing: Weight bearing as tolerated Pain 0/10 with mobility. Mobility (including Balance) Bed Mobility Bed Mobility: No Transfers Transfers: Yes Sit to Stand: 4: Min assist;With upper extremity assist;From bed (Min (guard)) Sit to Stand Details (indicate cue type and reason): Guarding for balance with cues for safest hand placement. Stand to Sit: 4: Min assist;With upper extremity assist;To chair/3-in-1 (Min (guard)) Stand to Sit Details: Guarding for balance with cues for safest hand placement. Ambulation/Gait Ambulation/Gait: Yes Ambulation/Gait Assistance: 4: Min assist (Min (guard)) Ambulation/Gait Assistance Details (indicate cue type and reason): Guarding for balance with cues for tall posture, increased step length, and head up  with eyes to horizon. Ambulation Distance (Feet): 50 Feet Assistive device: Rolling walker Gait Pattern: Step-to pattern;Decreased step length - right;Decreased step length - left;Trunk flexed;Decreased stance time - right Stairs: No Wheelchair Mobility Wheelchair Mobility: No  Posture/Postural Control Posture/Postural Control: No significant limitations Balance Balance Assessed: No End of Session PT - End of Session Equipment Utilized During Treatment: Gait belt Activity Tolerance: Patient tolerated treatment well Patient left: in chair;with call bell in reach Nurse Communication: Mobility status for transfers;Mobility status for ambulation General Behavior During Session: [facility] for tasks performed Cognition: [facility] for tasks performed  Cyndia Bent 11/20/2011, 12:49 PM  11/20/2011 Cyndia Bent, PT, DPT [phone]

## 2011-11-21 LAB — GLUCOSE, CAPILLARY
Glucose-Capillary: 98 mg/dL (ref 70–99)
Glucose-Capillary: 112 mg/dL — ABNORMAL HIGH (ref 70–99)

## 2011-11-21 LAB — PROTIME-INR: INR: 1.91 — ABNORMAL HIGH (ref 0.00–1.49)

## 2011-11-21 MED ORDER — WARFARIN SODIUM 5 MG PO TABS
5.0000 mg | ORAL_TABLET | Freq: Once | ORAL | Status: AC
  Administered 2011-11-21: 5 mg via ORAL
  Filled 2011-11-21: qty 1

## 2011-11-21 NOTE — Progress Notes (Signed)
[place] for coumadin Indication: VTE prophylaxis s/p R THR  Allergies  Allergen Reactions  . Eggs Or Egg-Derived Products Hives and Rash  . Penicillins Hives and Rash  . Quinine Derivatives Hives and Rash    Patient Measurements: Height: 5\' 3"  (160 cm) Weight: 186 lb 15.6 oz (84.81 kg) IBW/kg (Calculated) : 52.4    Vital Signs: Temp: 98.2 F (36.8 C) (03/24 0632) BP: 106/60 mmHg (03/24 0632) Pulse Rate: 69  (03/24 0632)  Labs:  Basename 11/21/11 0608 11/20/11 0500 11/19/11 0615  HGB -- -- 8.6*  HCT -- -- 26.3*  PLT -- -- 228  APTT -- -- --  LABPROT 22.2* 19.6* 17.9*  INR 1.91* 1.63* 1.45  HEPARINUNFRC -- -- --  CREATININE -- -- --  CKTOTAL -- -- --  CKMB -- -- --  TROPONINI -- -- --   Estimated Creatinine Clearance: 58.9 ml/min (by C-G formula based on Cr of 0.72).  Assessment: INR remains subtherapeutic but moving nicely on 5mg  daily. Will dc lovenox 40 mg sq q24 hours as INR >/= 1.8. No bleeding noted.  Goal of Therapy:  INR 2-3   Plan:  Will give coumadin 5 mg again today Cont daily PT/INR  Excell Seltzer, PharmD Clinical pharmacist, pager [phone] 11/21/2011,12:26 PM

## 2011-11-21 NOTE — Progress Notes (Signed)
Physical Therapy Treatment Patient Details Name: Kim Mullins MRN: [id number] DOB: 06-10-1930 Today's Date: 11/21/2011  PT Assessment/Plan  PT - Assessment/Plan Comments on Treatment Session: Continuing improvements; pt seems pleased with progress PT Plan: Discharge plan remains appropriate;Frequency remains appropriate PT Frequency: 7X/week Follow Up Recommendations: Skilled nursing facility Equipment Recommended: Defer to next venue PT Goals  Acute Rehab PT Goals PT Goal Formulation: With patient/family Time For Goal Achievement: 2 weeks Pt will go Supine/Side to Sit: with supervision PT Goal: Supine/Side to Sit - Progress: Progressing toward goal Pt will go Sit to Supine/Side: with supervision PT Goal: Sit to Supine/Side - Progress: Progressing toward goal Pt will go Sit to Stand: with supervision PT Goal: Sit to Stand - Progress: Progressing toward goal Pt will go Stand to Sit: with supervision PT Goal: Stand to Sit - Progress: Progressing toward goal Pt will Ambulate: 51 - 150 feet;with supervision;with least restrictive assistive device PT Goal: Ambulate - Progress: Progressing toward goal Pt will Perform Home Exercise Program: with supervision, verbal cues required/provided PT Goal: Perform Home Exercise Program - Progress: Progressing toward goal  PT Treatment Precautions/Restrictions  Precautions Precautions: Anterior Hip Precaution Booklet Issued: No Precaution Comments: Pt able to recall 3/3 anterior hip precautions. Required Braces or Orthoses: No Restrictions Weight Bearing Restrictions: Yes RLE Weight Bearing: Weight bearing as tolerated Mobility (including Balance) Bed Mobility Supine to Sit: 4: Min assist (Guard assist ) Supine to Sit Details (indicate cue type and reason): cues fro technique, and demo to use blanket roll as leg lifter Sit to Supine: 4: Min assist (guard assist) Sit to Supine - Details (indicate cue type and reason): cues for ant hip  prec and to use blanket roll as leg lifter Transfers Sit to Stand: 4: Min assist;From bed;From chair/3-in-1;With upper extremity assist (guard assist) Sit to Stand Details (indicate cue type and reason): cues for handplacement Stand to Sit: 4: Min assist;With upper extremity assist;To bed;To chair/3-in-1 (guard assist) Stand to Sit Details: cues for hand palcement and safety Ambulation/Gait Ambulation/Gait: Yes Ambulation/Gait Assistance: 4: Min assist (guard assist) Ambulation/Gait Assistance Details (indicate cue type and reason): cues for anterior hip prec Ambulation Distance (Feet): 70 Feet Assistive device: Rolling walker Gait Pattern: Step-to pattern;Decreased step length - right;Decreased step length - left;Trunk flexed;Decreased stance time - right    Exercise  Total Joint Exercises Quad Sets: AROM;Right;10 reps;Supine Gluteal Sets: AROM;Both;10 reps;Supine Towel Squeeze: AROM;Both;10 reps;Supine Heel Slides: AAROM;Right;10 reps;Supine Hip ABduction/ADduction: Other (comment) (Isometric activation of hip abductors; 10 reps) End of Session PT - End of Session Equipment Utilized During Treatment: Gait belt Activity Tolerance: Patient tolerated treatment well Patient left: in chair;with call bell in reach Nurse Communication: Mobility status for transfers;Mobility status for ambulation General Behavior During Session: [facility] for tasks performed Cognition: [facility] for tasks performed  [name] [facility] [place], [place]  11/21/2011, 1:19 PM

## 2011-11-21 NOTE — Progress Notes (Signed)
PATIENT ID: ANKITA FABIANO  MRN: [id number]  DOB/AGE:  [birth year] / 78 y.o.  5 Days Post-Op Procedure(s) (LRB): TOTAL HIP ARTHROPLASTY ANTERIOR APPROACH (Right)  Subjective: Pain is mild.  No c/o chest pain or SOB.   Up with PT yest.   Objective: Vital signs in last 24 hours: Temp:  [98.2 F (36.8 C)-99.1 F (37.3 C)] 98.2 F (36.8 C) (03/24 [id number]) Pulse Rate:  [69-85] 69  (03/24 0632) Resp:  [18-20] 20  (03/24 [id number]) BP: (106-118)/(60-66) 106/60 mmHg (03/24 0632) SpO2:  [97 %-100 %] 97 % (03/24 [id number])  Intake/Output from previous day: 03/23 0701 - 03/24 0700 In: 800 [P.O.:800] Out: -  Intake/Output this shift:     Basename 11/19/11 0615  HGB 8.6*    Basename 11/19/11 0615  WBC 9.5  RBC 2.92*  HCT 26.3*  PLT 228   No results found for this basename: NA:2,K:2,CL:2,CO2:2,BUN:2,CREATININE:2,GLUCOSE:2,CALCIUM:2 in the last 72 hours  Basename 11/21/11 0608 11/20/11 0500  LABPT -- --  INR 1.91* 1.63*    Physical Exam: Neurovascular intact Intact pulses distally Dorsiflexion/Plantar flexion intact Compartment soft  Assessment/Plan: 5 Days Post-Op Procedure(s) (LRB): TOTAL HIP ARTHROPLASTY ANTERIOR APPROACH (Right)   Up with therapy Weight Bearing as Tolerated (WBAT)  VTE prophylaxis: pharmacologic prophylaxis (with any of the following: warfarin adjusted-dose) To SNF tomorrow.   Nita Sells 11/21/2011, 10:14 AM

## 2011-11-22 LAB — GLUCOSE, CAPILLARY
Glucose-Capillary: 136 mg/dL — ABNORMAL HIGH (ref 70–99)
Glucose-Capillary: 121 mg/dL — ABNORMAL HIGH (ref 70–99)

## 2011-11-22 LAB — PROTIME-INR: INR: 2.13 — ABNORMAL HIGH (ref 0.00–1.49)

## 2011-11-22 NOTE — Progress Notes (Signed)
Subjective: 6 Days Post-Op Procedure(s) (LRB): TOTAL HIP ARTHROPLASTY ANTERIOR APPROACH (Right)  Activity level:  amb on halls Diet tolerance:  regular Voiding:  well Patient reports pain as mild.    Objective: Vital signs in last 24 hours: Temp:  [97.6 F (36.4 C)-99.1 F (37.3 C)] 97.6 F (36.4 C) (03/25 0617) Pulse Rate:  [72-85] 85  (03/25 0617) Resp:  [18-20] 18  (03/25 0617) BP: (99-129)/(65-90) 129/86 mmHg (03/25 0617) SpO2:  [99 %] 99 % (03/25 0617)  Intake/Output from previous day: 03/24 0701 - 03/25 0700 In: 720 [P.O.:720] Out: -  Intake/Output this shift:    No results found for this basename: HGB:5 in the last 72 hours No results found for this basename: WBC:2,RBC:2,HCT:2,PLT:2 in the last 72 hours No results found for this basename: NA:2,K:2,CL:2,CO2:2,BUN:2,CREATININE:2,GLUCOSE:2,CALCIUM:2 in the last 72 hours  Basename 11/22/11 0605 11/21/11 0608  LABPT -- --  INR 2.13* 1.91*    Physical Exam:  Neurologically intact ABD soft Sensation intact distally Intact pulses distally Dorsiflexion/Plantar flexion intact Compartment soft  Assessment/Plan: 6 Days Post-Op Procedure(s) (LRB): TOTAL HIP ARTHROPLASTY ANTERIOR APPROACH (Right) Discharge to SNF  Kim Mullins 11/22/2011, 8:55 AM

## 2011-11-22 NOTE — Progress Notes (Signed)
Physical Therapy Treatment Patient Details Name: Kim Mullins MRN: [id number] DOB: 31-Dec-1929 Today's Date: 11/22/2011  PT Assessment/Plan  PT - Assessment/Plan Comments on Treatment Session: Likely for dc to SNF for rehab today PT Plan: Discharge plan remains appropriate;Frequency remains appropriate PT Frequency: 7X/week Follow Up Recommendations: Skilled nursing facility Equipment Recommended: Defer to next venue PT Goals  Acute Rehab PT Goals Time For Goal Achievement: 2 weeks Pt will go Supine/Side to Sit: with supervision PT Goal: Supine/Side to Sit - Progress: Progressing toward goal Pt will go Sit to Stand: with supervision PT Goal: Sit to Stand - Progress: Progressing toward goal Pt will go Stand to Sit: with supervision PT Goal: Stand to Sit - Progress: Progressing toward goal Pt will Ambulate: 51 - 150 feet;with supervision;with least restrictive assistive device PT Goal: Ambulate - Progress: Progressing toward goal Pt will Perform Home Exercise Program: with supervision, verbal cues required/provided PT Goal: Perform Home Exercise Program - Progress: Progressing toward goal  PT Treatment Precautions/Restrictions  Precautions Precautions: Anterior Hip Precaution Booklet Issued: No Precaution Comments: Pt able to recall 3/3 anterior hip precautions. Required Braces or Orthoses: No Restrictions Weight Bearing Restrictions: Yes RLE Weight Bearing: Weight bearing as tolerated Mobility (including Balance) Bed Mobility Supine to Sit: 4: Min assist (guard assist) Supine to Sit Details (indicate cue type and reason): step-by-step cues for technique Transfers Sit to Stand: 4: Min assist;From bed;5: Supervision (Guard assist, progressing to Supervision) Sit to Stand Details (indicate cue type and reason): cues fro safety Stand to Sit: 4: Min assist;With upper extremity assist;To bed;To chair/3-in-1 Stand to Sit Details: continued cues for safety, UE use for  control Ambulation/Gait Ambulation/Gait Assistance: 4: Min assist (Guard assist) Ambulation/Gait Assistance Details (indicate cue type and reason): cues for ant hip prec, especially with turns Ambulation Distance (Feet): 28 Feet Assistive device: Rolling walker Gait Pattern: Step-to pattern    Exercise  Total Joint Exercises Quad Sets: AROM;Right;10 reps;Supine Gluteal Sets: AROM;Both;10 reps;Supine Towel Squeeze: AROM;Both;10 reps;Supine Heel Slides: AAROM;Right;10 reps;Supine Hip ABduction/ADduction: Other (comment) (Isometric activation of hip abductors, 10 reps) End of Session PT - End of Session Activity Tolerance: Patient tolerated treatment well Patient left: in chair;with call bell in reach (Breakfast re-heated) Nurse Communication: Mobility status for transfers;Mobility status for ambulation General Behavior During Session: [facility] for tasks performed Cognition: [facility] for tasks performed  [name] [name], [place]  11/22/2011, 11:21 AM

## 2011-11-22 NOTE — Progress Notes (Signed)
Clinical Social Work Department CLINICAL SOCIAL WORK PLACEMENT NOTE 11/22/2011  Patient:  NYAISA, HOLSTEIN  Account Number:  [id number] Admit date:  11/16/2011  Clinical Social Worker:  Lehman Prom  Date/time:  11/22/2011 12:50 PM  Clinical Social Work is seeking post-discharge placement for this patient at the following level of care:   [place]   (*CSW will update this form in Epic as items are completed)   11/19/2011  Patient/family provided with [place] Department of Clinical Social Work's list of facilities offering this level of care within the geographic area requested by the patient (or if unable, by the patient's family).  11/19/2011  Patient/family informed of their freedom to choose among providers that offer the needed level of care, that participate in Medicare, Medicaid or managed care program needed by the patient, have an available bed and are willing to accept the patient.  11/19/2011  Patient/family informed of [facility]' ownership interest in [facility], as well as of the fact that they are under no obligation to receive care at this facility.  PASARR submitted to EDS on 11/19/2011 PASARR number received from EDS on 11/19/2011  FL2 transmitted to all facilities in geographic area requested by pt/family on  11/22/2011 FL2 transmitted to all facilities within larger geographic area on   Patient informed that his/her managed care company has contracts with or will negotiate with  certain facilities, including the following:     Patient/family informed of bed offers received:  11/22/2011 Patient chooses bed at [facility] Physician recommends and patient chooses bed at  [place]  Patient to be transferred to [place] on  11/22/2011 Patient to be transferred to facility by [facility]  The following physician request were entered in Epic:   Additional  Comments: Blue Medicare Auth has been obtained for SNF placement and ambulance transport.

## 2011-11-22 NOTE — Progress Notes (Signed)
Pt is ready for discharge today to [facility]. Facility has received discharge summary and is ready to accept pt. Blue Medicare pre-auth has been obtained. Pt is agreeable to discharge plan to [facility]. [facility] will provide transportation to facility. CSW is signing off as no further clinical social work need identified.   Darden Dates, MSW, [facility] Coverage [phone]

## 2013-01-15 ENCOUNTER — Ambulatory Visit
Admission: RE | Admit: 2013-01-15 | Discharge: 2013-01-15 | Disposition: A | Discharge status: Home / Self Care | Payer: Medicare Other | Source: Ambulatory Visit | Attending: Family Medicine | Admitting: Family Medicine

## 2013-01-15 ENCOUNTER — Other Ambulatory Visit: Payer: Self-pay | Admitting: Family Medicine

## 2013-01-15 DIAGNOSIS — R52 Pain, unspecified: Secondary | ICD-10-CM

## 2013-03-01 ENCOUNTER — Other Ambulatory Visit: Payer: Self-pay | Admitting: Family Medicine

## 2013-03-01 DIAGNOSIS — R42 Dizziness and giddiness: Secondary | ICD-10-CM

## 2013-03-06 ENCOUNTER — Ambulatory Visit
Admission: RE | Admit: 2013-03-06 | Discharge: 2013-03-06 | Disposition: A | Discharge status: Home / Self Care | Payer: Medicare Other | Source: Ambulatory Visit | Attending: Family Medicine | Admitting: Family Medicine

## 2013-03-06 DIAGNOSIS — R42 Dizziness and giddiness: Secondary | ICD-10-CM

## 2013-05-16 ENCOUNTER — Other Ambulatory Visit: Payer: Self-pay | Admitting: Orthopedic Surgery

## 2013-05-16 DIAGNOSIS — M25511 Pain in right shoulder: Secondary | ICD-10-CM

## 2013-05-18 ENCOUNTER — Ambulatory Visit
Admission: RE | Admit: 2013-05-18 | Discharge: 2013-05-18 | Disposition: A | Discharge status: Home / Self Care | Payer: Medicare Other | Source: Ambulatory Visit | Attending: Orthopedic Surgery | Admitting: Orthopedic Surgery

## 2013-05-18 DIAGNOSIS — M25511 Pain in right shoulder: Secondary | ICD-10-CM

## 2013-06-19 ENCOUNTER — Ambulatory Visit (HOSPITAL_COMMUNITY)
Admission: RE | Admit: 2013-06-19 | Discharge: 2013-06-19 | Disposition: A | Discharge status: Home / Self Care | Payer: Medicare Other | Source: Ambulatory Visit | Attending: Anesthesiology | Admitting: Anesthesiology

## 2013-06-19 ENCOUNTER — Encounter (HOSPITAL_COMMUNITY)
Admission: RE | Admit: 2013-06-19 | Discharge: 2013-06-19 | Disposition: A | Discharge status: Home / Self Care | Payer: Medicare Other | Source: Ambulatory Visit | Attending: Orthopedic Surgery | Admitting: Orthopedic Surgery

## 2013-06-19 ENCOUNTER — Encounter (HOSPITAL_COMMUNITY): Payer: Self-pay

## 2013-06-19 DIAGNOSIS — Z01818 Encounter for other preprocedural examination: Secondary | ICD-10-CM | POA: Insufficient documentation

## 2013-06-19 DIAGNOSIS — Z01812 Encounter for preprocedural laboratory examination: Secondary | ICD-10-CM | POA: Insufficient documentation

## 2013-06-19 HISTORY — DX: Gastro-esophageal reflux disease without esophagitis: K21.9

## 2013-06-19 LAB — BASIC METABOLIC PANEL
CO2: 25 mEq/L (ref 19–32)
Chloride: 106 mEq/L (ref 96–112)
Creatinine, Ser: 0.8 mg/dL (ref 0.50–1.10)
GFR calc Af Amer: 79 mL/min — ABNORMAL LOW (ref >90)
Potassium: 4 mEq/L (ref 3.5–5.1)

## 2013-06-19 LAB — CBC
HCT: 37.6 % (ref 36.0–46.0)
Hemoglobin: 12.3 g/dL (ref 12.0–15.0)
MCV: 88.7 fL (ref 78.0–100.0)
WBC: 5.4 10*3/uL (ref 4.0–10.5)

## 2013-06-19 LAB — SURGICAL PCR SCREEN
MRSA, PCR: NEGATIVE
Staphylococcus aureus: POSITIVE — AB

## 2013-06-19 LAB — TYPE AND SCREEN: Antibody Screen: NEGATIVE

## 2013-06-19 NOTE — Pre-Procedure Instructions (Addendum)
MICA VILL  06/19/2013   Your procedure is scheduled on:  06/26/13  Report to [place]  2 * 3 at 8AM.  Call this number if you have problems the morning of surgery: [phone]   Remember:   Do not eat food or drink liquids after midnight.   Take these medicines the morning of surgery with A SIP OF WATER: tylenol   Do not wear jewelry, make-up or nail polish.  Do not wear lotions, powders, or perfumes. You may wear deodorant.  Do not shave 48 hours prior to surgery. Men may shave face and neck.  Do not bring valuables to the hospital.  [facility] is not responsible                  for any belongings or valuables.               Contacts, dentures or bridgework may not be worn into surgery.  Leave suitcase in the car. After surgery it may be brought to your room.  For patients admitted to the hospital, discharge time is determined by your                treatment team.               Patients discharged the day of surgery will not be allowed to drive  home.  Name and phone number of your driver:   Special Instructions: Shower using CHG 2 nights before surgery and the night before surgery.  If you shower the day of surgery use CHG.  Use special wash - you have one bottle of CHG for all showers.  You should use approximately 1/3 of the bottle for each shower.   Please read over the following fact sheets that you were given: Pain Booklet, Coughing and Deep Breathing and Blood Transfusion Information,surgical site infection

## 2013-06-19 NOTE — Progress Notes (Signed)
Office called for orders by [employer]

## 2013-06-20 NOTE — Progress Notes (Signed)
Anesthesia Chart Review:  Patient is a 79 year old female scheduled for right total shoulder arthroplasty on 06/26/13 by Dr. [name].    History includes former smoker, asthma, arthritis, anemia, obesity, GERD, DM2 not currently requiring meds, bowel obstruction, right THA 10/2011, tonsillectomy, hysterectomy. There is no reported history of CAD/MI/CHF. PCP is listed as Dr. Larina Earthly.  EKG on 06/19/13 showed NSR, LAFB, minimal voltage criteria for LVH, cannot rule out anterior infarct (age undetermined). It appears stable when compared to her previous EKG on 11/09/11 (which was felt stable since 09/03/08).  No CV symptoms were documented during her PAT visit.   CXR on 06/19/13 showed enlargement of the cardiac silhouette. No focal infiltrate or edema.  Preoperative labs noted.  I think her EKG appears overall stable since 10/2011.  She has tolerated a right THA since then.  She will be further evaluated by her assigned anesthesiologist on the day of surgery, but if no acute changes then I would anticipate that she could proceed as planned.  [facility] Short Stay Center/Anesthesiology Phone [phone] 06/20/2013 2:16 PM

## 2013-06-25 ENCOUNTER — Other Ambulatory Visit: Payer: Self-pay | Admitting: Orthopedic Surgery

## 2013-06-25 MED ORDER — CLINDAMYCIN PHOSPHATE 900 MG/50ML IV SOLN
900.0000 mg | INTRAVENOUS | Status: AC
  Administered 2013-06-26: 900 mg via INTRAVENOUS
  Filled 2013-06-25: qty 50

## 2013-06-26 ENCOUNTER — Inpatient Hospital Stay (HOSPITAL_COMMUNITY)
Admission: RE | Admit: 2013-06-26 | Discharge: 2013-06-28 | DRG: 483 | Disposition: A | Discharge status: Home Health | Payer: Medicare Other | Source: Ambulatory Visit | Attending: Orthopedic Surgery | Admitting: Orthopedic Surgery

## 2013-06-26 ENCOUNTER — Encounter (HOSPITAL_COMMUNITY): Payer: Medicare Other | Admitting: Vascular Surgery

## 2013-06-26 ENCOUNTER — Inpatient Hospital Stay (HOSPITAL_COMMUNITY): Payer: Medicare Other | Admitting: Certified Registered"

## 2013-06-26 ENCOUNTER — Encounter (HOSPITAL_COMMUNITY): Payer: Self-pay | Admitting: Certified Registered"

## 2013-06-26 ENCOUNTER — Inpatient Hospital Stay (HOSPITAL_COMMUNITY): Payer: Medicare Other

## 2013-06-26 ENCOUNTER — Encounter (HOSPITAL_COMMUNITY)
Admission: RE | Disposition: A | Discharge status: Home Health | Payer: Self-pay | Source: Ambulatory Visit | Attending: Orthopedic Surgery

## 2013-06-26 DIAGNOSIS — Z87891 Personal history of nicotine dependence: Secondary | ICD-10-CM

## 2013-06-26 DIAGNOSIS — K219 Gastro-esophageal reflux disease without esophagitis: Secondary | ICD-10-CM | POA: Diagnosis present

## 2013-06-26 DIAGNOSIS — Z79899 Other long term (current) drug therapy: Secondary | ICD-10-CM

## 2013-06-26 DIAGNOSIS — J45909 Unspecified asthma, uncomplicated: Secondary | ICD-10-CM | POA: Diagnosis present

## 2013-06-26 DIAGNOSIS — Z7982 Long term (current) use of aspirin: Secondary | ICD-10-CM

## 2013-06-26 DIAGNOSIS — E119 Type 2 diabetes mellitus without complications: Secondary | ICD-10-CM | POA: Diagnosis present

## 2013-06-26 DIAGNOSIS — M19019 Primary osteoarthritis, unspecified shoulder: Principal | ICD-10-CM | POA: Diagnosis present

## 2013-06-26 DIAGNOSIS — M19011 Primary osteoarthritis, right shoulder: Secondary | ICD-10-CM | POA: Diagnosis present

## 2013-06-26 HISTORY — PX: TOTAL SHOULDER ARTHROPLASTY: SHX126

## 2013-06-26 HISTORY — DX: Primary osteoarthritis, right shoulder: M19.011

## 2013-06-26 LAB — GLUCOSE, CAPILLARY
Glucose-Capillary: 91 mg/dL (ref 70–99)
Glucose-Capillary: 150 mg/dL — ABNORMAL HIGH (ref 70–99)
Glucose-Capillary: 170 mg/dL — ABNORMAL HIGH (ref 70–99)
Glucose-Capillary: 196 mg/dL — ABNORMAL HIGH (ref 70–99)

## 2013-06-26 LAB — CBC
Hemoglobin: 11.6 g/dL — ABNORMAL LOW (ref 12.0–15.0)
MCH: 29.1 pg (ref 26.0–34.0)
MCHC: 32.8 g/dL (ref 30.0–36.0)
Platelets: 226 10*3/uL (ref 150–400)
RBC: 3.99 MIL/uL (ref 3.87–5.11)
RDW: 13.2 % (ref 11.5–15.5)

## 2013-06-26 LAB — CREATININE, SERUM
Creatinine, Ser: 0.71 mg/dL (ref 0.50–1.10)
GFR calc Af Amer: >90 mL/min (ref >90)

## 2013-06-26 SURGERY — ARTHROPLASTY, SHOULDER, TOTAL
Anesthesia: Regional | Site: Shoulder | Laterality: Right | Wound class: Clean

## 2013-06-26 MED ORDER — METHOCARBAMOL 100 MG/ML IJ SOLN
500.0000 mg | Freq: Four times a day (QID) | INTRAVENOUS | Status: DC | PRN
  Filled 2013-06-26: qty 5

## 2013-06-26 MED ORDER — METHOCARBAMOL 500 MG PO TABS
500.0000 mg | ORAL_TABLET | Freq: Four times a day (QID) | ORAL | Status: DC | PRN
  Administered 2013-06-27 – 2013-06-28 (×3): 500 mg via ORAL
  Filled 2013-06-26 (×3): qty 1

## 2013-06-26 MED ORDER — SODIUM CHLORIDE 0.9 % IR SOLN
Status: DC | PRN
  Administered 2013-06-26: 3000 mL

## 2013-06-26 MED ORDER — CLINDAMYCIN PHOSPHATE 600 MG/50ML IV SOLN
600.0000 mg | Freq: Four times a day (QID) | INTRAVENOUS | Status: AC
  Administered 2013-06-26 – 2013-06-27 (×3): 600 mg via INTRAVENOUS
  Filled 2013-06-26 (×3): qty 50

## 2013-06-26 MED ORDER — 0.9 % SODIUM CHLORIDE (POUR BTL) OPTIME
TOPICAL | Status: DC | PRN
  Administered 2013-06-26: 1000 mL

## 2013-06-26 MED ORDER — POLYETHYLENE GLYCOL 3350 17 G PO PACK: 17.0000 g | PACK | Freq: Every day | ORAL | Status: DC | PRN

## 2013-06-26 MED ORDER — ONDANSETRON HCL 4 MG/2ML IJ SOLN: 4.0000 mg | Freq: Four times a day (QID) | INTRAMUSCULAR | Status: DC | PRN

## 2013-06-26 MED ORDER — HYDROMORPHONE HCL PF 1 MG/ML IJ SOLN: 0.5000 mg | INTRAMUSCULAR | Status: DC | PRN

## 2013-06-26 MED ORDER — METOCLOPRAMIDE HCL 10 MG PO TABS: 5.0000 mg | ORAL_TABLET | Freq: Three times a day (TID) | ORAL | Status: DC | PRN

## 2013-06-26 MED ORDER — BISACODYL 10 MG RE SUPP: 10.0000 mg | Freq: Every day | RECTAL | Status: DC | PRN

## 2013-06-26 MED ORDER — FENTANYL CITRATE 0.05 MG/ML IJ SOLN: 25.0000 ug | INTRAMUSCULAR | Status: DC | PRN

## 2013-06-26 MED ORDER — GLYCOPYRROLATE 0.2 MG/ML IJ SOLN
INTRAMUSCULAR | Status: DC | PRN
  Administered 2013-06-26: 0.4 mg via INTRAVENOUS
  Administered 2013-06-26: 0.2 mg via INTRAVENOUS

## 2013-06-26 MED ORDER — ONDANSETRON HCL 4 MG PO TABS: 4.0000 mg | ORAL_TABLET | Freq: Four times a day (QID) | ORAL | Status: DC | PRN

## 2013-06-26 MED ORDER — ONDANSETRON HCL 4 MG/2ML IJ SOLN
INTRAMUSCULAR | Status: DC | PRN
  Administered 2013-06-26: 4 mg via INTRAVENOUS

## 2013-06-26 MED ORDER — NEOSTIGMINE METHYLSULFATE 1 MG/ML IJ SOLN
INTRAMUSCULAR | Status: DC | PRN
  Administered 2013-06-26: 3 mg via INTRAVENOUS

## 2013-06-26 MED ORDER — ACETAMINOPHEN 325 MG PO TABS: 650.0000 mg | ORAL_TABLET | Freq: Four times a day (QID) | ORAL | Status: DC | PRN

## 2013-06-26 MED ORDER — LACTATED RINGERS IV SOLN
INTRAVENOUS | Status: DC | PRN
  Administered 2013-06-26 (×2): via INTRAVENOUS

## 2013-06-26 MED ORDER — PROPOFOL 10 MG/ML IV BOLUS
INTRAVENOUS | Status: DC | PRN
  Administered 2013-06-26: 170 mg via INTRAVENOUS

## 2013-06-26 MED ORDER — EPHEDRINE SULFATE 50 MG/ML IJ SOLN
INTRAMUSCULAR | Status: DC | PRN
  Administered 2013-06-26 (×4): 10 mg via INTRAVENOUS

## 2013-06-26 MED ORDER — SENNA 8.6 MG PO TABS
1.0000 | ORAL_TABLET | Freq: Two times a day (BID) | ORAL | Status: DC
  Administered 2013-06-26 – 2013-06-28 (×5): 8.6 mg via ORAL
  Filled 2013-06-26 (×6): qty 1

## 2013-06-26 MED ORDER — ACETAMINOPHEN 650 MG RE SUPP: 650.0000 mg | Freq: Four times a day (QID) | RECTAL | Status: DC | PRN

## 2013-06-26 MED ORDER — MENTHOL 3 MG MT LOZG: 1.0000 | LOZENGE | OROMUCOSAL | Status: DC | PRN

## 2013-06-26 MED ORDER — ASPIRIN EC 81 MG PO TBEC
81.0000 mg | DELAYED_RELEASE_TABLET | Freq: Every day | ORAL | Status: DC
  Administered 2013-06-26 – 2013-06-28 (×3): 81 mg via ORAL
  Filled 2013-06-26 (×3): qty 1

## 2013-06-26 MED ORDER — OXYCODONE HCL 5 MG PO TABS
5.0000 mg | ORAL_TABLET | ORAL | Status: DC | PRN
  Administered 2013-06-27 – 2013-06-28 (×4): 10 mg via ORAL
  Filled 2013-06-26 (×4): qty 2

## 2013-06-26 MED ORDER — LIDOCAINE HCL (CARDIAC) 20 MG/ML IV SOLN
INTRAVENOUS | Status: DC | PRN
  Administered 2013-06-26: 25 mg via INTRAVENOUS

## 2013-06-26 MED ORDER — POTASSIUM CHLORIDE IN NACL 20-0.45 MEQ/L-% IV SOLN
INTRAVENOUS | Status: DC
  Administered 2013-06-26: 75 mL/h via INTRAVENOUS
  Filled 2013-06-26 (×6): qty 1000

## 2013-06-26 MED ORDER — PHENOL 1.4 % MT LIQD: 1.0000 | OROMUCOSAL | Status: DC | PRN

## 2013-06-26 MED ORDER — ENOXAPARIN SODIUM 40 MG/0.4ML ~~LOC~~ SOLN
40.0000 mg | SUBCUTANEOUS | Status: DC
  Administered 2013-06-26 – 2013-06-27 (×2): 40 mg via SUBCUTANEOUS
  Filled 2013-06-26 (×3): qty 0.4

## 2013-06-26 MED ORDER — DOCUSATE SODIUM 100 MG PO CAPS
100.0000 mg | ORAL_CAPSULE | Freq: Two times a day (BID) | ORAL | Status: DC
  Administered 2013-06-26 – 2013-06-28 (×4): 100 mg via ORAL
  Filled 2013-06-26 (×4): qty 1

## 2013-06-26 MED ORDER — DEXAMETHASONE SODIUM PHOSPHATE 4 MG/ML IJ SOLN
INTRAMUSCULAR | Status: DC | PRN
  Administered 2013-06-26: 4 mg via INTRAVENOUS

## 2013-06-26 MED ORDER — ZOLPIDEM TARTRATE 5 MG PO TABS: 5.0000 mg | ORAL_TABLET | Freq: Every evening | ORAL | Status: DC | PRN

## 2013-06-26 MED ORDER — DIPHENHYDRAMINE HCL 12.5 MG/5ML PO ELIX
12.5000 mg | ORAL_SOLUTION | ORAL | Status: DC | PRN
Start: 2013-06-26 — End: 2013-06-28

## 2013-06-26 MED ORDER — OXYCODONE-ACETAMINOPHEN 5-325 MG PO TABS
1.0000 | ORAL_TABLET | ORAL | Status: DC | PRN
  Administered 2013-06-26: 1 via ORAL
  Administered 2013-06-27 – 2013-06-28 (×5): 2 via ORAL
  Filled 2013-06-26: qty 2
  Filled 2013-06-26: qty 1
  Filled 2013-06-26 (×4): qty 2

## 2013-06-26 MED ORDER — ROCURONIUM BROMIDE 100 MG/10ML IV SOLN
INTRAVENOUS | Status: DC | PRN
  Administered 2013-06-26: 40 mg via INTRAVENOUS

## 2013-06-26 MED ORDER — BUPIVACAINE-EPINEPHRINE PF 0.5-1:200000 % IJ SOLN
INTRAMUSCULAR | Status: DC | PRN
  Administered 2013-06-26: 25 mL

## 2013-06-26 MED ORDER — METOCLOPRAMIDE HCL 5 MG/ML IJ SOLN: 5.0000 mg | Freq: Three times a day (TID) | INTRAMUSCULAR | Status: DC | PRN

## 2013-06-26 MED ORDER — INSULIN ASPART 100 UNIT/ML ~~LOC~~ SOLN
0.0000 [IU] | Freq: Three times a day (TID) | SUBCUTANEOUS | Status: DC
  Administered 2013-06-26 (×2): 3 [IU] via SUBCUTANEOUS
  Administered 2013-06-27 – 2013-06-28 (×4): 2 [IU] via SUBCUTANEOUS

## 2013-06-26 MED ORDER — SUFENTANIL CITRATE 50 MCG/ML IV SOLN
INTRAVENOUS | Status: DC | PRN
  Administered 2013-06-26: 10 ug via INTRAVENOUS
  Administered 2013-06-26 (×2): 5 ug via INTRAVENOUS

## 2013-06-26 MED ORDER — OXYCODONE-ACETAMINOPHEN 5-325 MG PO TABS: 1.0000 | ORAL_TABLET | Freq: Four times a day (QID) | ORAL | Status: DC | PRN

## 2013-06-26 MED ORDER — ALUM & MAG HYDROXIDE-SIMETH 200-200-20 MG/5ML PO SUSP: 30.0000 mL | ORAL | Status: DC | PRN

## 2013-06-26 SURGICAL SUPPLY — 65 items
BENZOIN TINCTURE PRP APPL 2/3 (GAUZE/BANDAGES/DRESSINGS) ×2 IMPLANT
BIT DRILL QUICK REL 1/8 2PK SL (DRILL) ×1 IMPLANT
BLADE SAW SGTL MED 73X18.5 STR (BLADE) ×2 IMPLANT
BOOTCOVER CLEANROOM LRG (PROTECTIVE WEAR) ×4 IMPLANT
BOWL SMART MIX CTS (DISPOSABLE) IMPLANT
BRUSH FEMORAL CANAL (MISCELLANEOUS) IMPLANT
CAP TOTAL SHOULDER ×2 IMPLANT
CEMENT BONE DEPUY (Cement) ×2 IMPLANT
CLOSURE STERI-STRIP 1/4X4 (GAUZE/BANDAGES/DRESSINGS) ×2 IMPLANT
CLOTH BEACON ORANGE TIMEOUT ST (SAFETY) ×2 IMPLANT
COVER SURGICAL LIGHT HANDLE (MISCELLANEOUS) ×2 IMPLANT
COVER TABLE BACK 60X90 (DRAPES) IMPLANT
DRAPE C-ARM 42X72 X-RAY (DRAPES) IMPLANT
DRAPE INCISE IOBAN 66X45 STRL (DRAPES) ×2 IMPLANT
DRAPE U-SHAPE 47X51 STRL (DRAPES) ×2 IMPLANT
DRILL QUICK RELEASE 1/8 INCH (DRILL) ×1
DRSG MEPILEX BORDER 4X8 (GAUZE/BANDAGES/DRESSINGS) ×2 IMPLANT
DRSG PAD ABDOMINAL 8X10 ST (GAUZE/BANDAGES/DRESSINGS) ×2 IMPLANT
DURAPREP 26ML APPLICATOR (WOUND CARE) ×2 IMPLANT
ELECT BLADE 6.5 EXT (BLADE) IMPLANT
ELECT NEEDLE TIP 2.8 STRL (NEEDLE) ×2 IMPLANT
ELECT REM PT RETURN 9FT ADLT (ELECTROSURGICAL) ×2
ELECTRODE REM PT RTRN 9FT ADLT (ELECTROSURGICAL) ×1 IMPLANT
EVACUATOR 1/8 PVC DRAIN (DRAIN) IMPLANT
FACESHIELD LNG OPTICON STERILE (SAFETY) IMPLANT
GLOVE BIOGEL PI ORTHO PRO SZ8 (GLOVE) ×2
GLOVE ORTHO TXT STRL SZ7.5 (GLOVE) ×2 IMPLANT
GLOVE PI ORTHO PRO STRL SZ8 (GLOVE) ×2 IMPLANT
GLOVE SURG ORTHO 8.0 STRL STRW (GLOVE) ×4 IMPLANT
GOWN BRE IMP PREV XXLGXLNG (GOWN DISPOSABLE) ×2 IMPLANT
GOWN STRL REIN XL XLG (GOWN DISPOSABLE) IMPLANT
HANDPIECE INTERPULSE COAX TIP (DISPOSABLE)
HOOD PEEL AWAY FACE SHEILD DIS (HOOD) ×4 IMPLANT
KIT BASIN OR (CUSTOM PROCEDURE TRAY) ×2 IMPLANT
KIT ROOM TURNOVER OR (KITS) ×2 IMPLANT
MANIFOLD NEPTUNE II (INSTRUMENTS) ×2 IMPLANT
NEEDLE 1/2 CIR CATGUT .05X1.09 (NEEDLE) ×2 IMPLANT
NEEDLE HYPO 25GX1X1/2 BEV (NEEDLE) ×2 IMPLANT
NS IRRIG 1000ML POUR BTL (IV SOLUTION) ×2 IMPLANT
PACK SHOULDER (CUSTOM PROCEDURE TRAY) ×2 IMPLANT
PAD ARMBOARD 7.5X6 YLW CONV (MISCELLANEOUS) ×4 IMPLANT
RETRIEVER SUT HEWSON (MISCELLANEOUS) IMPLANT
SET HNDPC FAN SPRY TIP SCT (DISPOSABLE) IMPLANT
SLING ARM IMMOBILIZER LRG (SOFTGOODS) IMPLANT
SLING ARM IMMOBILIZER MED (SOFTGOODS) IMPLANT
SMARTMIX MINI TOWER (MISCELLANEOUS)
SPONGE GAUZE 4X4 12PLY (GAUZE/BANDAGES/DRESSINGS) ×2 IMPLANT
SPONGE LAP 18X18 X RAY DECT (DISPOSABLE) ×2 IMPLANT
STRIP CLOSURE SKIN 1/2X4 (GAUZE/BANDAGES/DRESSINGS) ×2 IMPLANT
SUCTION FRAZIER TIP 10 FR DISP (SUCTIONS) ×2 IMPLANT
SUPPORT WRAP ARM LG (MISCELLANEOUS) ×2 IMPLANT
SUT FIBERWIRE #2 38 REV NDL BL (SUTURE) ×6
SUT MNCRL AB 4-0 PS2 18 (SUTURE) ×2 IMPLANT
SUT VIC AB 0 CT1 27 (SUTURE) ×1
SUT VIC AB 0 CT1 27XBRD ANBCTR (SUTURE) ×1 IMPLANT
SUT VIC AB 2-0 CT1 27 (SUTURE)
SUT VIC AB 2-0 CT1 TAPERPNT 27 (SUTURE) IMPLANT
SUT VIC AB 3-0 SH 18 (SUTURE) ×2 IMPLANT
SUTURE FIBERWR#2 38 REV NDL BL (SUTURE) ×3 IMPLANT
SYR CONTROL 10ML LL (SYRINGE) ×2 IMPLANT
TOWEL OR 17X24 6PK STRL BLUE (TOWEL DISPOSABLE) ×2 IMPLANT
TOWEL OR 17X26 10 PK STRL BLUE (TOWEL DISPOSABLE) ×2 IMPLANT
TOWER SMARTMIX MINI (MISCELLANEOUS) IMPLANT
TRAY FOLEY CATH 16FRSI W/METER (SET/KITS/TRAYS/PACK) IMPLANT
WATER STERILE IRR 1000ML POUR (IV SOLUTION) ×2 IMPLANT

## 2013-06-26 NOTE — Evaluation (Signed)
Physical Therapy Evaluation Patient Details Name: Kim Mullins MRN: [id number] DOB: 09-03-1929 Today's Date: 06/26/2013 Time: [id number] PT Time Calculation (min): 27 min  PT Assessment / Plan / Recommendation History of Present Illness  Pt is an 79 y/o female admitted s/p R TSA  on 06-26-2013.   Clinical Impression  Pt presents with decreased independence to perform functional activity following R TSA. At the time of PT eval, pt reports she has been seeing a physical therapist to increase strength and balance, and required min to mod assist for all functional mobility. QC would be appropriate for next session for balance.    PT Assessment  Patient needs continued PT services    Follow Up Recommendations  Home health PT    Does the patient have the potential to tolerate intense rehabilitation      Barriers to Discharge        Equipment Recommendations  None recommended by PT    Recommendations for Other Services     Frequency Min 5X/week    Precautions / Restrictions Precautions Precautions: Shoulder;Fall Shoulder Interventions: Shoulder sling/immobilizer;At all times Required Braces or Orthoses: Sling Restrictions Weight Bearing Restrictions: Yes Other Position/Activity Restrictions: No WB status in chart, kept pt NWB throughout session, discussed with nursing.   Pertinent Vitals/Pain Pt reports no pain, stating her shoulder is numb.      Mobility  Bed Mobility Bed Mobility: Supine to Sit;Sitting - Scoot to Edge of Bed;Sit to Supine;Scooting to [facility] Supine to Sit: 3: Mod assist;HOB elevated;With rails Sitting - Scoot to Edge of Bed: 4: Min guard Sit to Supine: 4: Min assist;HOB flat;With rail Scooting to [facility]: 5: Supervision;With rail Details for Bed Mobility Assistance: VC's for sequencing and safety awareness to maintain shoulder precautions. Transfers Transfers: Sit to Stand;Stand to Sit Sit to Stand: 4: Min assist;From bed;With upper extremity  assist;From elevated surface Stand to Sit: 4: Min guard;To bed;With upper extremity assist Details for Transfer Assistance: Assist with LUE only for transfers, using momentum to power up to full standing. Pt cued for safety awareness and maintaining shoulder precautions/NWB through RUE. Ambulation/Gait Ambulation/Gait Assistance: 4: Min assist Ambulation Distance (Feet): 10 Feet Assistive device: 1 person hand held assist Ambulation/Gait Assistance Details: HHA on LUE for support. Pt overall with decreased balance and steadiness. QC for next session may be helpful Gait Pattern: Step-through pattern;Decreased stride length;Decreased trunk rotation Gait velocity: decreased Stairs: No    Exercises     PT Diagnosis: Difficulty walking  PT Problem List: Decreased strength;Decreased range of motion;Decreased activity tolerance;Decreased balance;Decreased mobility;Decreased knowledge of use of DME;Decreased safety awareness;Decreased knowledge of precautions PT Treatment Interventions: DME instruction;Gait training;Stair training;Functional mobility training;Therapeutic activities;Therapeutic exercise;Neuromuscular re-education;Patient/family education     PT Goals(Current goals can be found in the care plan section) Acute Rehab PT Goals Patient Stated Goal: To return home PT Goal Formulation: With patient/family Time For Goal Achievement: 07/03/13 Potential to Achieve Goals: Fair  Visit Information  Last PT Received On: 06/26/13 Assistance Needed: +1 History of Present Illness: Pt is an 79 y/o female admitted s/p R TSA  on 06-26-2013.        Prior [place] expects to be discharged to:: Private residence Living Arrangements: Other relatives Available Help at Discharge: Family;Available 24 hours/day Type of Home: House Home Access: Stairs to enter [employer] of Steps: 1 step Home Layout: One level Home Equipment: Walker - 4 wheels;Cane -  quad;Cane - single point;Grab bars - tub/shower (Uses step stool in shower as  seat) Prior Function Level of Independence: Needs assistance Gait / Transfers Assistance Needed: cane or walker occasionally if out in town ADL's / Homemaking Assistance Needed: assist with lower body dressing Communication Communication: No difficulties Dominant Hand: Right    Cognition  Cognition Arousal/Alertness: Awake/alert Behavior During Therapy: WFL for tasks assessed/performed Overall Cognitive Status: Within Functional Limits for tasks assessed    Extremity/Trunk Assessment Upper Extremity Assessment Upper Extremity Assessment: RUE deficits/detail RUE: Unable to fully assess due to immobilization Lower Extremity Assessment Lower Extremity Assessment: Overall WFL for tasks assessed Cervical / Trunk Assessment Cervical / Trunk Assessment: Kyphotic   Balance Balance Balance Assessed: Yes Static Sitting Balance Static Sitting - Balance Support: Feet supported;Left upper extremity supported Static Sitting - Level of Assistance: 5: Stand by assistance Static Sitting - Comment/# of Minutes: 2 minutes during MMT of LE's Static Standing Balance Static Standing - Balance Support: Left upper extremity supported Static Standing - Level of Assistance: 4: Min assist Static Standing - Comment/# of Minutes: 30 seconds  End of Session PT - End of Session Equipment Utilized During Treatment: Gait belt Activity Tolerance: Patient tolerated treatment well Patient left: in bed;with call bell/phone within reach;with family/visitor present Nurse Communication: Mobility status  GP     Jolyn Lent 06/26/2013, 3:47 PM  Jolyn Lent, [place], DPT Acute Rehabilitation Services [phone]

## 2013-06-26 NOTE — Transfer of Care (Signed)
Immediate Anesthesia Transfer of Care Note  Patient: Kim Mullins  Procedure(s) Performed: Procedure(s): TOTAL SHOULDER ARTHROPLASTY (Right)  Patient Location: PACU  Anesthesia Type:GA combined with regional for post-op pain  Level of Consciousness: awake, alert , oriented and patient cooperative  Airway & Oxygen Therapy: Patient Spontanous Breathing and Patient connected to nasal cannula oxygen  Post-op Assessment: Report given to PACU RN, Post -op Vital signs reviewed and stable and Patient moving all extremities X 4  Post vital signs: Reviewed and stable  Complications: No apparent anesthesia complications

## 2013-06-26 NOTE — Anesthesia Preprocedure Evaluation (Addendum)
Anesthesia Evaluation  Patient identified by MRN, date of birth, ID band Patient awake    Reviewed: Allergy & Precautions, H&P , NPO status , Patient's Chart, lab work & pertinent test results  Airway Mallampati: II TM Distance: >3 FB Neck ROM: Full    Dental no notable dental hx. (+) Teeth Intact and Dental Advisory Given   Pulmonary asthma ,  breath sounds clear to auscultation  Pulmonary exam normal       Cardiovascular negative cardio ROS  Rhythm:Regular Rate:Normal     Neuro/Psych negative neurological ROS  negative psych ROS   GI/Hepatic Neg liver ROS, GERD-  Controlled,  Endo/Other  diabetes, Type 2, Oral Hypoglycemic Agents  Renal/GU negative Renal ROS  negative genitourinary   Musculoskeletal   Abdominal   Peds  Hematology negative hematology ROS (+)   Anesthesia Other Findings   Reproductive/Obstetrics negative OB ROS                          Anesthesia Physical Anesthesia Plan  ASA: II  Anesthesia Plan: General and Regional   Post-op Pain Management:    Induction: Intravenous  Airway Management Planned: Oral ETT  Additional Equipment:   Intra-op Plan:   Post-operative Plan: Extubation in OR  Informed Consent: I have reviewed the patients History and Physical, chart, labs and discussed the procedure including the risks, benefits and alternatives for the proposed anesthesia with the patient or authorized representative who has indicated his/her understanding and acceptance.   Dental advisory given  Plan Discussed with: CRNA and Surgeon  Anesthesia Plan Comments:         Anesthesia Quick Evaluation

## 2013-06-26 NOTE — Anesthesia Procedure Notes (Signed)
Anesthesia Regional Block:  Interscalene brachial plexus block  Pre-Anesthetic Checklist: ,, timeout performed, Correct Patient, Correct Site, Correct Laterality, Correct Procedure, Correct Position, site marked, Risks and benefits discussed, pre-op evaluation,  At surgeon's request and post-op pain management  Laterality: Right  Prep: Maximum Sterile Barrier Precautions used and chloraprep       Needles:  Injection technique: Single-shot  Needle Type: Echogenic Stimulator Needle     Needle Length: 5cm 5 cm Needle Gauge: 22 and 22 G    Additional Needles:  Procedures: ultrasound guided (picture in chart) and nerve stimulator Interscalene brachial plexus block  Nerve Stimulator or Paresthesia:  Response: Biceps response,   Additional Responses:   Narrative:  Start time: 06/26/2013 7:10 AM End time: 06/26/2013 7:20 AM Injection made incrementally with aspirations every 5 mL. Anesthesiologist: Ola Spurr, MD  Additional Notes: 2% Lidocaine skin wheel.   Interscalene brachial plexus block

## 2013-06-26 NOTE — Anesthesia Postprocedure Evaluation (Signed)
  Anesthesia Post-op Note  Patient: Kim Mullins  Procedure(s) Performed: Procedure(s): TOTAL SHOULDER ARTHROPLASTY (Right)  Patient Location: PACU  Anesthesia Type:General and block  Level of Consciousness: awake and alert   Airway and Oxygen Therapy: Patient Spontanous Breathing  Post-op Pain: none  Post-op Assessment: Post-op Vital signs reviewed, Patient's Cardiovascular Status Stable and Respiratory Function Stable  Post-op Vital Signs: Reviewed  Filed Vitals:   06/26/13 1200  BP: 127/78  Pulse: 82  Temp: 36.6 C  Resp: 18    Complications: No apparent anesthesia complications

## 2013-06-26 NOTE — Progress Notes (Signed)
Utilization Review Completed.Donne Anon T10/28/2014

## 2013-06-26 NOTE — H&P (Signed)
PREOPERATIVE H&P  Chief Complaint: DJD RIGHT SHOULDER  HPI: Kim Mullins is a 79 y.o. female who presents for preoperative history and physical with a diagnosis of DJD RIGHT SHOULDER. Symptoms are rated as moderate to severe, and have been worsening.  This is significantly impairing activities of daily living.  She has elected for surgical management.   Past Medical History  Diagnosis Date  . Bowel obstruction   . Left tibial fracture 2007  . Asthma     Allergixc reaction to cats.  . Anemia     occassionally  . Arthritis     all over.  . Diabetes mellitus 2006    Diet and exercise controlled.  Marland Kitchen GERD (gastroesophageal reflux disease)     occ   Past Surgical History  Procedure Laterality Date  . Abdominal hysterectomy  1969  . Dilation and curettage of uterus  1968  . Pilonidal cyst excision  1959  . Corn removal  1999    Bilateral feet  . Bunionectomy  1984    Bilateral  . Tonsillectomy  1942  . Eye surgery  1983    Surgery to fix Retainal detachment, bilateral  . Carpal tunnell  2004    Bilateral  . Total hip arthroplasty  11/16/2011    Procedure: TOTAL HIP ARTHROPLASTY ANTERIOR APPROACH;  Surgeon: Hessie Dibble, MD;  Location: [place];  Service: Orthopedics;  Laterality: Right;  DEPUY   History   Social History  . Marital Status: Widowed    Spouse Name: N/A    Number of Children: N/A  . Years of Education: N/A   Social History Main Topics  . Smoking status: Former Smoker -- 1.00 packs/day for 45 years    Types: Cigarettes    Quit date: 07/24/1978  . Smokeless tobacco: None  . Alcohol Use: 4.2 oz/week    7 Glasses of wine per week  . Drug Use: No  . Sexual Activity: None   Other Topics Concern  . None   Social History Narrative  . None   No family history on file. Allergies  Allergen Reactions  . Eggs Or Egg-Derived Products Hives and Rash  . Penicillins Hives and Rash  . Quinine Derivatives Hives and Rash   Prior to Admission  medications   Medication Sig Start Date End Date Taking? Authorizing Provider  acetaminophen (TYLENOL) 500 MG tablet Take 500 mg by mouth every 6 (six) hours as needed for pain.   Yes Historical Provider, MD  aspirin EC 81 MG tablet Take 81 mg by mouth daily.   Yes Historical Provider, MD  meloxicam (MOBIC) 15 MG tablet Take 15 mg by mouth daily.   Yes Historical Provider, MD  bisacodyl (DULCOLAX) 5 MG EC tablet Take 5-10 mg by mouth daily as needed. For constipation    Historical Provider, MD     Positive ROS: All other systems have been reviewed and were otherwise negative with the exception of those mentioned in the HPI and as above.  Physical Exam: General: Alert, no acute distress Cardiovascular: No pedal edema Respiratory: No cyanosis, mild use of accessory musculature GI: No organomegaly, abdomen is soft and non-tender Skin: No lesions in the area of chief complaint Neurologic: Sensation intact distally Psychiatric: Patient is competent for consent with normal mood and affect Lymphatic: No axillary or cervical lymphadenopathy  MUSCULOSKELETAL: right shoulder with crepitance and AROM 0-80 degrees.  XR with end stage DJD right shoulder, osteophytes, sclerosis.  Assessment: DJD RIGHT SHOULDER  Plan: Plan for  Procedure(s): TOTAL SHOULDER ARTHROPLASTY  The risks benefits and alternatives were discussed with the patient including but not limited to the risks of nonoperative treatment, versus surgical intervention including infection, bleeding, nerve injury,  blood clots, cardiopulmonary complications, morbidity, mortality, among others, and they were willing to proceed.   Johnny Bridge, MD Cell [phone]   06/26/2013 7:20 AM

## 2013-06-26 NOTE — Progress Notes (Signed)
Report given to elise rn for lunch relief

## 2013-06-26 NOTE — Progress Notes (Signed)
Orthopedic Tech Progress Note Patient Details:  Kim Mullins Oct 17, 1929 [id number] Patient has sling and unable to use overhead frame     Braulio Bosch 06/26/2013, 6:26 PM

## 2013-06-26 NOTE — Op Note (Signed)
06/26/2013  10:04 AM  PATIENT:  Kim Mullins    PRE-OPERATIVE DIAGNOSIS:  DJD RIGHT SHOULDER  POST-OPERATIVE DIAGNOSIS:  Same  PROCEDURE:  TOTAL SHOULDER ARTHROPLASTY  SURGEON:  Johnny Bridge, MD  PHYSICIAN ASSISTANT: Joya Gaskins, OPA-C, present and scrubbed throughout the case, critical for completion in a timely fashion, and for retraction, instrumentation, and closure.  ANESTHESIA:   General  PREOPERATIVE INDICATIONS:  [name] is a  79 y.o. female with a diagnosis of DJD RIGHT SHOULDER who failed conservative measures and elected for surgical management.    The risks benefits and alternatives were discussed with the patient preoperatively including but not limited to the risks of infection, bleeding, nerve injury, cardiopulmonary complications, the need for revision surgery, dislocation, loosening, incomplete relief of pain, among others, and the patient was willing to proceed.   OPERATIVE IMPLANTS: Biomet size 9 mini press-fit humeral stem, size 48+18 Versa-dial humeral head, set in the D position with increased coverage superiorly, with a small cemented glenoid polyethylene 3 peg implant with a central regenerex noncemented post.   OPERATIVE FINDINGS: Advanced glenohumeral osteoarthritis involving the glenoid and the humeral head with substantial osteophyte formation inferiorly.   OPERATIVE PROCEDURE: The patient was brought to the operating room and placed in the supine position. General anesthesia was administered. IV antibiotics were given.  A foley was placed. The upper extremity was prepped and draped in usual sterile fashion. The patient was in a beachchair position with all bony prominences padded.   Time out was performed and a deltopectoral approach was carried out. The biceps tendon was tenodesed to the pectoralis tendon. The subscapularis was released, tagging it with a #2 FiberWire, leaving a cuff of tendon for repair.   The inferior osteophyte  was removed, and release of the capsule off of the humeral side was completed. The head was dislocated, and I reamed sequentially. I placed the humeral cutting guide at 30 of retroversion, and then pinned this into place, and made my humeral neck cut. Initially, my cup was slightly high, and access to the glenoid was limited, and so I went back and made a slightly more inferior resection.   I then placed deep retractors and exposed the glenoid. I excised the labrum circumferentially, taking care to protect the axillary nerve inferiorly.   I then placed a guidewire into the center position, controlling appropriate version and inclination. I then reamed over the guidewire with the small reamer, and was satisfied with the preparation. I preserved the subchondral bone in order to maximize the strength and minimize the risk for subsequent subsidence.   I then drilled the central hole for the regenerex peg, and then placed the guide, and then drilled the 3 peripheral peg holes. I had excellent bony circumferential contact. All 3 holes had good endpoint, although the posterior hole may have had some slight breaching of the side, but overall good bony contact.  I then cleaned the glenoid, irrigated it copiously, and then dried it and cemented the prosthesis into place. Excellent seating was achieved. I had full exposure. The cement cured, and then I turned my attention to the humeral side.   I sequentially broached, up to the selected size, with the broach set at 30 of retroversion. I then placed the real stem. I trialed with multiple heads, and the above-named component was selected. Increased posterior coverage improved the coverage. The soft tissue tension was appropriate.   I then impacted the real humeral head into place, reduced the head,  and irrigated copiously. Excellent stability and range of motion was achieved. I repaired the subscapularis with 4 #2 FiberWire, as well as the rotator interval, and  irrigated copiously once more. The subcutaneous tissue was closed with Vicryl including the deltopectoral fascia.   The skin was closed with Steri-Strips and sterile gauze was applied. She had a preoperative nerve block. She tolerated the procedure well and there were no complications.

## 2013-06-27 ENCOUNTER — Encounter (HOSPITAL_COMMUNITY): Payer: Self-pay | Admitting: Orthopedic Surgery

## 2013-06-27 LAB — CBC
HCT: 34 % — ABNORMAL LOW (ref 36.0–46.0)
MCH: 28.6 pg (ref 26.0–34.0)
MCV: 88.3 fL (ref 78.0–100.0)
Platelets: 209 10*3/uL (ref 150–400)
RBC: 3.85 MIL/uL — ABNORMAL LOW (ref 3.87–5.11)
RDW: 13.5 % (ref 11.5–15.5)

## 2013-06-27 LAB — BASIC METABOLIC PANEL
BUN: 17 mg/dL (ref 6–23)
CO2: 25 mEq/L (ref 19–32)
Calcium: 8.9 mg/dL (ref 8.4–10.5)
Creatinine, Ser: 0.73 mg/dL (ref 0.50–1.10)
Glucose, Bld: 184 mg/dL — ABNORMAL HIGH (ref 70–99)
Sodium: 134 mEq/L — ABNORMAL LOW (ref 135–145)

## 2013-06-27 LAB — GLUCOSE, CAPILLARY
Glucose-Capillary: 142 mg/dL — ABNORMAL HIGH (ref 70–99)
Glucose-Capillary: 131 mg/dL — ABNORMAL HIGH (ref 70–99)
Glucose-Capillary: 128 mg/dL — ABNORMAL HIGH (ref 70–99)

## 2013-06-27 NOTE — Progress Notes (Signed)
Patient ID: Kim Mullins, female   DOB: [birth year], 79 y.o.   MRN: [id number]     Subjective:  Patient reports pain as mild.  Patient states that she is having more pain this AM but still would like to go home.  Objective:   VITALS:   Filed Vitals:   06/26/13 1200 06/26/13 1400 06/26/13 2142 06/27/13 0600  BP: 127/78 122/76 107/54 108/56  Pulse: 82 82 81 87  Temp: 97.9 F (36.6 C) 97.4 F (36.3 C) 97.7 F (36.5 C) 97.6 F (36.4 C)  TempSrc: Oral     Resp: 18 16 18 18   SpO2: 98% 97% 95% 99%    ABD soft Sensation intact distally Dorsiflexion/Plantar flexion intact Incision: dressing C/D/I and no drainage   Lab Results  Component Value Date   WBC 10.2 06/27/2013   HGB 11.0* 06/27/2013   HCT 34.0* 06/27/2013   MCV 88.3 06/27/2013   PLT 209 06/27/2013     Assessment/Plan: 1 Day Post-Op   Principal Problem:   Osteoarthritis of right shoulder region   Advance diet Up with therapy May plan for DC home in the afternoon if doing ok with po pain and if patient able to pass PT NWB right upper ext  Sling at all times  Follow up in two weeks with Dr [name]  ADDENDUM:  Patient complaining of pain, doesn't feel ready to go home.  Will plan to dc tomorrow if pain controlled.  Patient requested around 2p due to family transport.    Remonia Richter 06/27/2013, 7:45 AM   Marchia Bond, MD Cell [phone]

## 2013-06-27 NOTE — Evaluation (Signed)
Occupational Therapy Evaluation Patient Details Name: Kim Mullins MRN: [id number] DOB: Nov 26, 1929 Today's Date: 06/27/2013 Time: [id number] OT Time Calculation (min): 52 min  OT Assessment / Plan / Recommendation History of present illness Pt is an 79 y/o female admitted s/p R TSA  on 06-26-2013 [name]).    Clinical Impression   Patient is s/p R TSA resulting in functional limitations due to the deficits listed below (see OT Problem List). Patient is currently close supervision-Max assist therefore patient will benefit from skilled OT in a home health settingto increase their safety and independence with ADL and functional mobility for ADL.  Patient and granddaughter educated on shoulder protocol topics and patient is ready for d/c home with [name].     OT Assessment  All further OT needs can be met in the next venue of care    Follow Up Recommendations  Home health OT;Supervision/Assistance - 24 hour    Barriers to Discharge [place] works part-time, they are hoping neighbors can assist  Equipment Recommendations   (has toiler riser with arms and may purchase a tub bench after d/c)    Precautions / Restrictions Precautions Precautions: Shoulder;Fall Shoulder Interventions: Shoulder sling/immobilizer;At all times Precaution Booklet Issued: Yes (comment) Precaution Comments: Shoulder Protocol provided and reviewed with patient ahd her caregiver (grandaughter) Required Braces or Orthoses: Sling Restrictions Weight Bearing Restrictions: Yes Other Position/Activity Restrictions: No WB status in chart, kept pt NWB throughout session, discussed with nursing.   Pertinent Vitals/Pain 8/10 right shoulder, rest, repositioned, premedicated, RN aware    ADL  Eating/Feeding: Minimal assistance Where Assessed - Eating/Feeding: Chair Grooming: Performed;Minimal assistance Where Assessed - Grooming: Unsupported sitting Upper Body Bathing: Simulated;Maximal assistance Where  Assessed - Upper Body Bathing: Unsupported sitting Lower Body Bathing: Simulated;Moderate assistance Where Assessed - Lower Body Bathing: Supported standing;Unsupported sitting Upper Body Dressing: Maximal assistance;Simulated Where Assessed - Upper Body Dressing: Unsupported sitting;Supported standing Lower Body Dressing: Simulated;Moderate assistance Where Assessed - Lower Body Dressing: Unsupported sitting;Supported standing Toilet Transfer: Performed;Minimal assistance Toilet Transfer Method: Sit to stand;Stand pivot Toilet Transfer Equipment: Comfort height toilet Toileting - Clothing Manipulation and Hygiene: Performed;Minimal assistance;Simulated Where Assessed - Camera operator Manipulation and Hygiene: Standing;Sit on 3-in-1 or toilet Transfers/Ambulation Related to ADLs: No AD while ambulate in hospital room, close supervision-min assist with balance and sit><stand ADL Comments: Recommend tub bench, patient will obtain this item after discharge    OT Diagnosis: Generalized weakness;Acute pain  OT Problem List: Decreased strength;Decreased range of motion;Decreased activity tolerance;Impaired balance (sitting and/or standing);Decreased knowledge of use of DME or AE;Decreased knowledge of precautions;Pain;Impaired UE functional use    Visit Information  Last OT Received On: 06/27/13 Assistance Needed: +1 History of Present Illness: Pt is an 79 y/o female admitted s/p R TSA  on 06-26-2013 [name]).        Prior [place] expects to be discharged to:: Private residence Living Arrangements: Other relatives Available Help at Discharge: Family;Available 24 hours/day Type of Home: House Home Access: Stairs to enter [employer] of Steps: 1 step Home Layout: One level Home Equipment: Walker - 4 wheels;Cane - quad;Cane - single point;Grab bars - tub/shower;Toilet riser (recommend tub bench-patient may pruchase after d/c) Prior  Function Level of Independence: Needs assistance Gait / Transfers Assistance Needed: cane or walker occasionally if out in town ADL's / Homemaking Assistance Needed: occasional assist with lower body dressing Communication Communication: No difficulties Dominant Hand: Right    Cognition  Cognition Arousal/Alertness: Awake/alert Behavior During Therapy: [facility]  for tasks assessed/performed Overall Cognitive Status: Within Functional Limits for tasks assessed    Extremity/Trunk Assessment Upper Extremity Assessment Upper Extremity Assessment: LUE deficits/detail;RUE deficits/detail RUE Deficits / Details: distal to elbow AROM WFL RUE: Unable to fully assess due to immobilization LUE Deficits / Details: Patient with limited A/AA/PROM of left shoulder LUE: Unable to fully assess due to pain Cervical / Trunk Assessment Cervical / Trunk Assessment: Kyphotic     Mobility Transfers Sit to Stand: 4: Min assist;From bed;With upper extremity assist;From elevated surface;With armrests;From chair/3-in-1 Stand to Sit: 4: Min guard;To bed;With upper extremity assist;With armrests;To chair/3-in-1 Details for Transfer Assistance: Assist with LUE only for transfers, using momentum to power up to full standing. Pt cued for safety awareness and maintaining shoulder precautions/NWB through RUE.     Balance Static Sitting Balance Static Sitting - Balance Support: Feet supported;Left upper extremity supported Static Sitting - Level of Assistance: 5: Stand by assistance Static Standing Balance Static Standing - Balance Support: Left upper extremity supported Static Standing - Level of Assistance: 4: Min assist Static Standing - Comment/# of Minutes: 2 min   End of Session OT - End of Session Equipment Utilized During Treatment: Other (comment) (sling) Activity Tolerance: Patient tolerated treatment well;Patient limited by pain Patient left: in bed;with call bell/phone within reach;with family/visitor  present  [place], Silva Aamodt 06/27/2013, 9:47 AM

## 2013-06-27 NOTE — Progress Notes (Signed)
Physical Therapy Treatment Patient Details Name: Kim Mullins MRN: [id number] DOB: 1930-08-13 Today's Date: 06/27/2013 Time: [id number] PT Time Calculation (min): 23 min  PT Assessment / Plan / Recommendation  History of Present Illness Pt is an 79 y/o female admitted s/p R TSA  on 06-26-2013 [name]).    PT Comments   Pt eager to go home today.  Requires min (A) for ambulation.  Pt & granddaughter with questions Re: getting in/out of car.  Educated pt on car transfers.      Follow Up Recommendations  Home health PT     Does the patient have the potential to tolerate intense rehabilitation     Barriers to Discharge        Equipment Recommendations  None recommended by PT    Recommendations for Other Services    Frequency Min 5X/week   Progress towards PT Goals Progress towards PT goals: Progressing toward goals  Plan Current plan remains appropriate    Precautions / Restrictions Precautions Precautions: Shoulder;Fall Shoulder Interventions: Shoulder sling/immobilizer;At all times Precaution Booklet Issued: Yes (comment) Precaution Comments: Shoulder Protocol provided and reviewed with patient ahd her caregiver (grandaughter) Required Braces or Orthoses: Sling Restrictions Weight Bearing Restrictions: Yes RUE Weight Bearing: Non weight bearing Other Position/Activity Restrictions: Per ortho PA note, pt NWBing RUE   Pertinent Vitals/Pain 1-2/10 Rt UE.  But still requesting for pain medication.  RN notified.      Mobility  Bed Mobility Bed Mobility: Supine to Sit;Sitting - Scoot to Edge of Bed Supine to Sit: 5: Supervision Sitting - Scoot to Edge of Bed: 5: Supervision Details for Bed Mobility Assistance: Pt compliant with NWBing RUE Transfers Transfers: Sit to Stand;Stand to Sit Sit to Stand: 4: Min guard;With upper extremity assist;From bed Stand to Sit: 4: Min guard;With upper extremity assist;With armrests;To chair/3-in-1 Details for Transfer Assistance:  guarding for safety.  Cues to ensure balance before walking Ambulation/Gait Ambulation/Gait Assistance: 4: Min assist Ambulation Distance (Feet): 180 Feet Assistive device: 1 person hand held assist Ambulation/Gait Assistance Details: (A) provided via HHA  for stability.   Gait Pattern: Step-through pattern;Decreased stride length Stairs: Yes Stairs Assistance: 4: Min assist Stairs Assistance Details (indicate cue type and reason): (A) for stability Stair Management Technique: No rails;Step to pattern;Forwards Number of Stairs: 1 (2x's) Wheelchair Mobility Wheelchair Mobility: No      PT Goals (current goals can now be found in the care plan section) Acute Rehab PT Goals Patient Stated Goal: To return home PT Goal Formulation: With patient/family Time For Goal Achievement: 07/03/13 Potential to Achieve Goals: Fair  Visit Information  Last PT Received On: 06/27/13 Assistance Needed: +1 History of Present Illness: Pt is an 79 y/o female admitted s/p R TSA  on 06-26-2013 [name]).     Subjective Data  Patient Stated Goal: To return home   Cognition  Cognition Arousal/Alertness: Awake/alert Behavior During Therapy: WFL for tasks assessed/performed Overall Cognitive Status: Within Functional Limits for tasks assessed    Balance  Static Sitting Balance Static Sitting - Balance Support: Feet supported;Left upper extremity supported Static Sitting - Level of Assistance: 5: Stand by assistance Static Standing Balance Static Standing - Balance Support: Left upper extremity supported Static Standing - Level of Assistance: 4: Min assist Static Standing - Comment/# of Minutes: 2 min  End of Session PT - End of Session Equipment Utilized During Treatment: Gait belt;Other (comment) (RUE sling) Activity Tolerance: Patient tolerated treatment well Patient left: in chair;with call bell/phone within reach;with nursing/sitter in room;with  family/visitor present Nurse Communication:  Mobility status   GP     Sena Hitch 06/27/2013, 12:00 PM   Sarajane Marek, PTA [phone] 06/27/2013

## 2013-06-28 LAB — GLUCOSE, CAPILLARY
Glucose-Capillary: 124 mg/dL — ABNORMAL HIGH (ref 70–99)
Glucose-Capillary: 106 mg/dL — ABNORMAL HIGH (ref 70–99)

## 2013-06-28 NOTE — Progress Notes (Signed)
Physical Therapy Treatment Patient Details Name: Kim Mullins MRN: [id number] DOB: Jul 24, 1930 Today's Date: 06/28/2013 Time: [id number] PT Time Calculation (min): 24 min  PT Assessment / Plan / Recommendation  History of Present Illness Pt is an 79 y/o female admitted s/p R TSA  on 06-26-2013 [name]).    PT Comments   Pt cont's to require overall min (A) for ambulation.  Trialed gait training with use of quad cane but cont's to be unsteady.  Pt states she has quad cane at home.  Instructed pt to await for HHPT to come to provide more practice with cane & ensure safety before she attempts by herself.  Pt agreeable.     Follow Up Recommendations  Home health PT     Does the patient have the potential to tolerate intense rehabilitation     Barriers to Discharge        Equipment Recommendations  None recommended by PT    Recommendations for Other Services    Frequency Min 5X/week   Progress towards PT Goals Progress towards PT goals: Progressing toward goals  Plan Current plan remains appropriate    Precautions / Restrictions Precautions Precautions: Shoulder;Fall Shoulder Interventions: Shoulder sling/immobilizer;At all times Precaution Booklet Issued: Yes (comment) Precaution Comments: Shoulder Protocol provided and reviewed with patient ahd her caregiver (grandaughter) Required Braces or Orthoses: Sling Restrictions RUE Weight Bearing: Non weight bearing Other Position/Activity Restrictions: Per ortho PA note, pt NWBing RUE       Mobility  Bed Mobility Bed Mobility: Not assessed Transfers Transfers: Sit to Stand;Stand to Sit Sit to Stand: 4: Min guard;With upper extremity assist;With armrests;From chair/3-in-1 Stand to Sit: 4: Min guard;With upper extremity assist;With armrests;To chair/3-in-1 Details for Transfer Assistance: Pt uses rocking motion to gain momentum to achieve standing from chair Ambulation/Gait Ambulation/Gait Assistance: 4: Min  assist Ambulation Distance (Feet): 150 Feet Assistive device: Small based quad cane Ambulation/Gait Assistance Details: Trialed ambulation with use of quad cane.  Pt states she has quad cane at home.  Cues for safe use of cane.  Cont'd to require (A) for stability with cane.  Encouraged pt to use HHA with granddaughter until HHPT comes out to assess her & ok her to cont use of quad cane.  pt agreeable.   Gait Pattern: Step-to pattern;Decreased step length - right;Decreased step length - left;Shuffle;Narrow base of support;Decreased trunk rotation Gait velocity: decreased Stairs: No      PT Goals (current goals can now be found in the care plan section) Acute Rehab PT Goals PT Goal Formulation: With patient/family Time For Goal Achievement: 07/03/13 Potential to Achieve Goals: Fair  Visit Information  Last PT Received On: 06/28/13 Assistance Needed: +1 History of Present Illness: Pt is an 79 y/o female admitted s/p R TSA  on 06-26-2013 [name]).     Subjective Data      Cognition  Cognition Arousal/Alertness: Awake/alert Behavior During Therapy: WFL for tasks assessed/performed Overall Cognitive Status: Within Functional Limits for tasks assessed    Balance     End of Session PT - End of Session Equipment Utilized During Treatment: Gait belt Activity Tolerance: Patient tolerated treatment well Patient left: in chair;with call bell/phone within reach (ice pack applied to RUE) Nurse Communication: Mobility status   GP     Sena Hitch 06/28/2013, 11:52 AM  Sarajane Marek, PTA [phone] 06/28/2013

## 2013-06-28 NOTE — Progress Notes (Signed)
06/28/13 Spoke with patient about HHC. She selected [facility] from [facility] list of agencies. Contacted Debbie with [facility] and set up [place], [place] and [place]. Patient states that she has a cane at home, no equipment needs identified. Patient states that family will be assisting her at home.No other d/c needs identified. Fuller Plan RN, BSN, CCM

## 2013-06-28 NOTE — Progress Notes (Signed)
Patient ID: Kim Mullins, female   DOB: 1929-10-29, 79 y.o.   MRN: [id number]     Subjective:  Patient reports pain as mild.  Patient sitting in the chair and reading the newspaper states that she feels better today and that she would like another sling. Denies any CP or SOB.  Objective:   VITALS:   Filed Vitals:   06/27/13 0600 06/27/13 1408 06/27/13 2033 06/28/13 0535  BP: 108/56 116/70 111/69 106/91  Pulse: 87 73 71 77  Temp: 97.6 F (36.4 C) 98.9 F (37.2 C) 98.7 F (37.1 C) 98.4 F (36.9 C)  TempSrc:  Oral Oral Oral  Resp: 18 18 18 18   SpO2: 99% 96% 97% 96%    ABD soft Sensation intact distally Dorsiflexion/Plantar flexion intact Incision: dressing C/D/I and no drainage   Lab Results  Component Value Date   WBC 10.2 06/27/2013   HGB 11.0* 06/27/2013   HCT 34.0* 06/27/2013   MCV 88.3 06/27/2013   PLT 209 06/27/2013     Assessment/Plan: 2 Days Post-Op   Principal Problem:   Osteoarthritis of right shoulder region   Advance diet Up with therapy Discharge home with home health Will order a new sling nwb right upper ext Sling at all times Dry dressing prn   Rande Brunt, BRANDON 06/28/2013, 7:14 AM   Marchia Bond, MD Cell [phone]

## 2013-06-28 NOTE — Discharge Summary (Signed)
Physician Discharge Summary  Patient ID: KWANITA ORMAN MRN: [id number] DOB/AGE: 77-Aug-1931 77 y.o.  Admit date: 06/26/2013 Discharge date: 06/28/2013  Admission Diagnoses:  Osteoarthritis of right shoulder region  Discharge Diagnoses:  Principal Problem:   Osteoarthritis of right shoulder region   Past Medical History  Diagnosis Date  . Bowel obstruction   . Left tibial fracture 2007  . Asthma     Allergixc reaction to cats.  . Anemia     occassionally  . Arthritis     all over.  . Diabetes mellitus 2006    Diet and exercise controlled.  Marland Kitchen GERD (gastroesophageal reflux disease)     occ  . Osteoarthritis of right shoulder region 06/26/2013    Surgeries: Procedure(s): TOTAL SHOULDER ARTHROPLASTY on 06/26/2013   Consultants (if any):    Discharged Condition: Improved  Hospital Course: FRANCIE ABELLERA is an 79 y.o. female who was admitted 06/26/2013 with a diagnosis of Osteoarthritis of right shoulder region and went to the operating room on 06/26/2013 and underwent the above named procedures.    She was given perioperative antibiotics:  Anti-infectives   Start     Dose/Rate Route Frequency Ordered Stop   06/26/13 1300  clindamycin (CLEOCIN) IVPB 600 mg     600 mg 100 mL/hr over 30 Minutes Intravenous Every 6 hours 06/26/13 1204 06/27/13 0118   06/26/13 0600  clindamycin (CLEOCIN) IVPB 900 mg     900 mg 100 mL/hr over 30 Minutes Intravenous On call to O.R. 06/25/13 1410 06/26/13 0750    .  She was given sequential compression devices, early ambulation, and lovenox for DVT prophylaxis.  She benefited maximally from the hospital stay and there were no complications.    Recent vital signs:  Filed Vitals:   06/28/13 0535  BP: 106/91  Pulse: 77  Temp: 98.4 F (36.9 C)  Resp: 18    Recent laboratory studies:  Lab Results  Component Value Date   HGB 11.0* 06/27/2013   HGB 11.6* 06/26/2013   HGB 12.3 06/19/2013   Lab Results  Component Value  Date   WBC 10.2 06/27/2013   PLT 209 06/27/2013   Lab Results  Component Value Date   INR 2.13* 11/22/2011   Lab Results  Component Value Date   NA 134* 06/27/2013   K 4.6 06/27/2013   CL 101 06/27/2013   CO2 25 06/27/2013   BUN 17 06/27/2013   CREATININE 0.73 06/27/2013   GLUCOSE 184* 06/27/2013    Discharge Medications:     Medication List    STOP taking these medications       acetaminophen 500 MG tablet  Commonly known as:  TYLENOL     meloxicam 15 MG tablet  Commonly known as:  MOBIC      TAKE these medications       aspirin EC 81 MG tablet  Take 81 mg by mouth daily.     bisacodyl 5 MG EC tablet  Commonly known as:  DULCOLAX  Take 5-10 mg by mouth daily as needed. For constipation     oxyCODONE-acetaminophen 5-325 MG per tablet  Commonly known as:  ROXICET  Take 1-2 tablets by mouth every 6 (six) hours as needed for pain.        Diagnostic Studies: Dg Chest 2 View  06/19/2013   CLINICAL DATA:  Preop for shoulder surgery.  EXAM: CHEST  2 VIEW  COMPARISON:  11/09/2011  FINDINGS: Cardiac silhouette is enlarged. No focal infiltrate or edema. No pleural  effusion or pneumothorax. No acute osseous abnormality.  IMPRESSION: Enlargement of the cardiac silhouette. No focal infiltrate or edema.   Electronically Signed   By: Donavan Burnet M.D.   On: 06/19/2013 14:49   Dg Shoulder Right  06/26/2013   CLINICAL DATA:  Total shoulder arthroplasty  EXAM: RIGHT SHOULDER - 2+ VIEW  COMPARISON:  05/18/2013  FINDINGS: Right total shoulder arthroplasty has been placed. Anatomic alignment based on a single view. No breakage or loosening of the hardware.  IMPRESSION: Right total shoulder arthroplasty anatomically aligned.   Electronically Signed   By: Maryclare Bean M.D.   On: 06/26/2013 11:04    Disposition: 03-Skilled Nursing Facility      Discharge Orders   Future Orders Complete By Expires   Call MD / Call 911  As directed    Comments:     If you experience chest  pain or shortness of breath, CALL 911 and be transported to the hospital emergency room.  If you develope a fever above 101 F, pus (white drainage) or increased drainage or redness at the wound, or calf pain, call your surgeon's office.   Constipation Prevention  As directed    Comments:     Drink plenty of fluids.  Prune juice may be helpful.  You may use a stool softener, such as Colace (over the counter) 100 mg twice a day.  Use MiraLax (over the counter) for constipation as needed.   Diet general  As directed    Discharge instructions  As directed    Comments:     Change dressing in 3 days and reapply fresh dressing, unless you have a splint (half cast).  If you have a splint/cast, just leave in place until your follow-up appointment.    Keep wounds dry for 3 weeks.  Leave steri-strips in place on skin.  Do not apply lotion or anything to the wound.   OT splint  As directed 06/26/2014   Comments:     Ok for elbow, wrist, and hand motion.  Sling at all times except for hygiene, no shoulder activity until 6 weeks.      Follow-up Information   Follow up with Johnny Bridge, MD. Schedule an appointment as soon as possible for a visit in 2 weeks.   Specialty:  Orthopedic Surgery   Contact information:   [place] [address] [place] [address] [phone]       Please follow up. [facility] [phone] they will contact you)        Signed: Morgon Pamer P 06/28/2013, 1:22 PM

## 2014-01-26 ENCOUNTER — Encounter (HOSPITAL_COMMUNITY): Payer: Self-pay | Admitting: Emergency Medicine

## 2014-01-26 ENCOUNTER — Emergency Department (INDEPENDENT_AMBULATORY_CARE_PROVIDER_SITE_OTHER): Payer: Medicare Other

## 2014-01-26 ENCOUNTER — Emergency Department (INDEPENDENT_AMBULATORY_CARE_PROVIDER_SITE_OTHER)
Admission: EM | Admit: 2014-01-26 | Discharge: 2014-01-26 | Disposition: A | Discharge status: Home / Self Care | Payer: Medicare Other | Source: Home / Self Care | Attending: Family Medicine | Admitting: Family Medicine

## 2014-01-26 DIAGNOSIS — R0609 Other forms of dyspnea: Secondary | ICD-10-CM

## 2014-01-26 DIAGNOSIS — R0989 Other specified symptoms and signs involving the circulatory and respiratory systems: Secondary | ICD-10-CM

## 2014-01-26 DIAGNOSIS — R0600 Dyspnea, unspecified: Secondary | ICD-10-CM

## 2014-01-26 MED ORDER — METHYLPREDNISOLONE ACETATE 80 MG/ML IJ SUSP
INTRAMUSCULAR | Status: AC
  Filled 2014-01-26: qty 1

## 2014-01-26 MED ORDER — ALBUTEROL SULFATE HFA 108 (90 BASE) MCG/ACT IN AERS: 1.0000 | INHALATION_SPRAY | Freq: Four times a day (QID) | RESPIRATORY_TRACT | Status: DC | PRN

## 2014-01-26 MED ORDER — IPRATROPIUM BROMIDE 0.02 % IN SOLN
RESPIRATORY_TRACT | Status: AC
  Filled 2014-01-26: qty 2.5

## 2014-01-26 MED ORDER — METHYLPREDNISOLONE ACETATE 40 MG/ML IJ SUSP
80.0000 mg | Freq: Once | INTRAMUSCULAR | Status: AC
  Administered 2014-01-26: 80 mg via INTRAMUSCULAR

## 2014-01-26 MED ORDER — IPRATROPIUM BROMIDE 0.02 % IN SOLN
0.5000 mg | Freq: Once | RESPIRATORY_TRACT | Status: AC
  Administered 2014-01-26: 0.5 mg via RESPIRATORY_TRACT

## 2014-01-26 MED ORDER — ALBUTEROL SULFATE (2.5 MG/3ML) 0.083% IN NEBU
2.5000 mg | INHALATION_SOLUTION | Freq: Once | RESPIRATORY_TRACT | Status: AC
  Administered 2014-01-26: 2.5 mg via RESPIRATORY_TRACT

## 2014-01-26 MED ORDER — ALBUTEROL SULFATE (2.5 MG/3ML) 0.083% IN NEBU
INHALATION_SOLUTION | RESPIRATORY_TRACT | Status: AC
  Filled 2014-01-26: qty 3

## 2014-01-26 NOTE — ED Provider Notes (Signed)
CSN: [id number]     Arrival date & time 01/26/14  0910 History   First MD Initiated Contact with Patient 01/26/14 [phone]     Chief Complaint  Patient presents with  . Shortness of Breath   (Consider location/radiation/quality/duration/timing/severity/associated sxs/prior Treatment) Patient is a 80 y.o. female presenting with shortness of breath. The history is provided by the patient.  Shortness of Breath Severity:  Moderate Onset quality:  Gradual Duration:  3 days Progression:  Worsening Chronicity:  New Context: pollens   Context comment:  Pt concerned about mold problem in house, h/o asthma from cats. Associated symptoms: cough   Associated symptoms: no abdominal pain, no chest pain, no fever, no PND and no wheezing     Past Medical History  Diagnosis Date  . Bowel obstruction   . Left tibial fracture 2007  . Asthma     Allergixc reaction to cats.  . Anemia     occassionally  . Arthritis     all over.  . Diabetes mellitus 2006    Diet and exercise controlled.  Marland Kitchen GERD (gastroesophageal reflux disease)     occ  . Osteoarthritis of right shoulder region 06/26/2013   Past Surgical History  Procedure Laterality Date  . Abdominal hysterectomy  1969  . Dilation and curettage of uterus  1968  . Pilonidal cyst excision  1959  . Corn removal  1999    Bilateral feet  . Bunionectomy  1984    Bilateral  . Tonsillectomy  1942  . Eye surgery  1983    Surgery to fix Retainal detachment, bilateral  . Carpal tunnell  2004    Bilateral  . Total hip arthroplasty  11/16/2011    Procedure: TOTAL HIP ARTHROPLASTY ANTERIOR APPROACH;  Surgeon: Hessie Dibble, MD;  Location: [place];  Service: Orthopedics;  Laterality: Right;  DEPUY  . Total shoulder arthroplasty Right 06/26/2013    Procedure: TOTAL SHOULDER ARTHROPLASTY;  Surgeon: Johnny Bridge, MD;  Location: [place];  Service: Orthopedics;  Laterality: Right;   History reviewed. No pertinent family history. History  Substance Use  Topics  . Smoking status: Former Smoker -- 1.00 packs/day for 45 years    Types: Cigarettes    Quit date: 07/24/1978  . Smokeless tobacco: Not on file  . Alcohol Use: 4.2 oz/week    7 Glasses of wine per week   OB History   Grav Para Term Preterm Abortions TAB SAB Ect Mult Living                 Review of Systems  Constitutional: Negative.  Negative for fever.  HENT: Positive for congestion.   Respiratory: Positive for cough and shortness of breath. Negative for wheezing.   Cardiovascular: Negative for chest pain and PND.  Gastrointestinal: Negative for abdominal pain.    Allergies  Eggs or egg-derived products; Penicillins; and Quinine derivatives  Home Medications   Prior to Admission medications   Medication Sig Start Date End Date Taking? Authorizing Provider  albuterol (PROVENTIL HFA;VENTOLIN HFA) 108 (90 BASE) MCG/ACT inhaler Inhale 1-2 puffs into the lungs every 6 (six) hours as needed for wheezing or shortness of breath. 01/26/14   Billy Fischer, MD  aspirin EC 81 MG tablet Take 81 mg by mouth daily.    Historical Provider, MD  bisacodyl (DULCOLAX) 5 MG EC tablet Take 5-10 mg by mouth daily as needed. For constipation    Historical Provider, MD  oxyCODONE-acetaminophen (ROXICET) 5-325 MG per tablet Take 1-2 tablets  by mouth every 6 (six) hours as needed for pain. 06/26/13   Johnny Bridge, MD   BP 129/86  Pulse 85  Temp(Src) 98.2 F (36.8 C) (Oral)  Resp 12  SpO2 96% Physical Exam  Nursing note and vitals reviewed. Constitutional: She is oriented to person, place, and time. She appears well-developed and well-nourished. No distress.  HENT:  Head: Normocephalic.  Right Ear: External ear normal.  Left Ear: External ear normal.  Mouth/Throat: Oropharynx is clear and moist.  Eyes: EOM are normal. Pupils are equal, round, and reactive to light.  Neck: Normal range of motion. Neck supple.  Cardiovascular: Regular rhythm, normal heart sounds and intact distal pulses.    Pulmonary/Chest: Effort normal. No respiratory distress. She has no wheezes. She has no rales.  Musculoskeletal: She exhibits no edema.  Lymphadenopathy:    She has no cervical adenopathy.  Neurological: She is alert and oriented to person, place, and time.  Skin: Skin is warm and dry.    ED Course  Procedures (including critical care time) Labs Review Labs Reviewed - No data to display  Imaging Review Dg Chest 2 View  01/26/2014   CLINICAL DATA:  Shortness of breath, cough  EXAM: CHEST  2 VIEW  COMPARISON:  06/19/2013  FINDINGS: Mild bibasilar opacities, likely atelectasis. No focal consolidation. No pleural effusion or pneumothorax.  Heart is top-normal in size/ mildly enlarged.  Degenerative changes of the visualized thoracolumbar spine.  Status post right shoulder arthroplasty.  IMPRESSION: No evidence of acute cardiopulmonary disease.   Electronically Signed   By: Julian Hy M.D.   On: 01/26/2014 10:50   X-rays reviewed and report per radiologist.   MDM   1. Dyspnea    Sx much improved after neb rx, lungs continue clear    Billy Fischer, MD 01/26/14 1054

## 2014-01-26 NOTE — ED Notes (Signed)
Pt  Reports  Shortness of  Breath           X  3  Days    Pt  Has  History            Of          allergys  And  Asthma   Pt  States  She   Is  A  Diabetic       Diet  Controlled

## 2014-02-09 ENCOUNTER — Emergency Department (HOSPITAL_COMMUNITY)
Admission: EM | Admit: 2014-02-09 | Discharge: 2014-02-09 | Disposition: A | Discharge status: Home / Self Care | Payer: Medicare Other | Attending: Emergency Medicine | Admitting: Emergency Medicine

## 2014-02-09 ENCOUNTER — Emergency Department (HOSPITAL_COMMUNITY): Payer: Medicare Other

## 2014-02-09 ENCOUNTER — Encounter (HOSPITAL_COMMUNITY): Payer: Self-pay | Admitting: Emergency Medicine

## 2014-02-09 ENCOUNTER — Emergency Department (INDEPENDENT_AMBULATORY_CARE_PROVIDER_SITE_OTHER)
Admission: EM | Admit: 2014-02-09 | Discharge: 2014-02-09 | Disposition: A | Discharge status: Nursing Home (Includes ICF) | Payer: Medicare Other | Source: Home / Self Care | Attending: Emergency Medicine | Admitting: Emergency Medicine

## 2014-02-09 DIAGNOSIS — E119 Type 2 diabetes mellitus without complications: Secondary | ICD-10-CM | POA: Diagnosis not present

## 2014-02-09 DIAGNOSIS — R0602 Shortness of breath: Secondary | ICD-10-CM

## 2014-02-09 DIAGNOSIS — Z7982 Long term (current) use of aspirin: Secondary | ICD-10-CM | POA: Insufficient documentation

## 2014-02-09 DIAGNOSIS — J45909 Unspecified asthma, uncomplicated: Secondary | ICD-10-CM | POA: Diagnosis not present

## 2014-02-09 DIAGNOSIS — R112 Nausea with vomiting, unspecified: Secondary | ICD-10-CM | POA: Diagnosis not present

## 2014-02-09 DIAGNOSIS — Z880 Allergy status to penicillin: Secondary | ICD-10-CM | POA: Insufficient documentation

## 2014-02-09 DIAGNOSIS — N289 Disorder of kidney and ureter, unspecified: Secondary | ICD-10-CM | POA: Insufficient documentation

## 2014-02-09 DIAGNOSIS — Z87891 Personal history of nicotine dependence: Secondary | ICD-10-CM | POA: Insufficient documentation

## 2014-02-09 DIAGNOSIS — M19019 Primary osteoarthritis, unspecified shoulder: Secondary | ICD-10-CM | POA: Insufficient documentation

## 2014-02-09 DIAGNOSIS — K5900 Constipation, unspecified: Secondary | ICD-10-CM | POA: Diagnosis not present

## 2014-02-09 DIAGNOSIS — Z8781 Personal history of (healed) traumatic fracture: Secondary | ICD-10-CM | POA: Diagnosis not present

## 2014-02-09 DIAGNOSIS — Z862 Personal history of diseases of the blood and blood-forming organs and certain disorders involving the immune mechanism: Secondary | ICD-10-CM | POA: Diagnosis not present

## 2014-02-09 DIAGNOSIS — R1012 Left upper quadrant pain: Secondary | ICD-10-CM | POA: Insufficient documentation

## 2014-02-09 DIAGNOSIS — R197 Diarrhea, unspecified: Secondary | ICD-10-CM | POA: Diagnosis not present

## 2014-02-09 DIAGNOSIS — R109 Unspecified abdominal pain: Secondary | ICD-10-CM

## 2014-02-09 LAB — COMPREHENSIVE METABOLIC PANEL
ALT: 68 U/L — ABNORMAL HIGH (ref 0–35)
AST: 83 U/L — AB (ref 0–37)
Albumin: 3.7 g/dL (ref 3.5–5.2)
Alkaline Phosphatase: 81 U/L (ref 39–117)
BUN: 18 mg/dL (ref 6–23)
CO2: 21 mEq/L (ref 19–32)
CREATININE: 0.91 mg/dL (ref 0.50–1.10)
Calcium: 8.9 mg/dL (ref 8.4–10.5)
Chloride: 100 mEq/L (ref 96–112)
GFR calc Af Amer: 67 mL/min — ABNORMAL LOW (ref >90)
GFR calc non Af Amer: 58 mL/min — ABNORMAL LOW (ref >90)
Glucose, Bld: 150 mg/dL — ABNORMAL HIGH (ref 70–99)
Potassium: 4.6 mEq/L (ref 3.7–5.3)
Sodium: 139 mEq/L (ref 137–147)
TOTAL PROTEIN: 7.9 g/dL (ref 6.0–8.3)
Total Bilirubin: 0.8 mg/dL (ref 0.3–1.2)

## 2014-02-09 LAB — CBC WITH DIFFERENTIAL/PLATELET
BASOS PCT: 0 % (ref 0–1)
Basophils Absolute: 0 10*3/uL (ref 0.0–0.1)
EOS ABS: 0 10*3/uL (ref 0.0–0.7)
EOS PCT: 0 % (ref 0–5)
HEMATOCRIT: 36 % (ref 36.0–46.0)
Hemoglobin: 11.4 g/dL — ABNORMAL LOW (ref 12.0–15.0)
Lymphocytes Relative: 13 % (ref 12–46)
Lymphs Abs: 0.9 10*3/uL (ref 0.7–4.0)
MCH: 27.5 pg (ref 26.0–34.0)
MCHC: 31.7 g/dL (ref 30.0–36.0)
MCV: 86.7 fL (ref 78.0–100.0)
MONO ABS: 0.3 10*3/uL (ref 0.1–1.0)
MONOS PCT: 5 % (ref 3–12)
NEUTROS ABS: 5.7 10*3/uL (ref 1.7–7.7)
Neutrophils Relative %: 82 % — ABNORMAL HIGH (ref 43–77)
Platelets: 232 10*3/uL (ref 150–400)
RBC: 4.15 MIL/uL (ref 3.87–5.11)
RDW: 14.6 % (ref 11.5–15.5)
WBC: 7 10*3/uL (ref 4.0–10.5)

## 2014-02-09 LAB — URINALYSIS, ROUTINE W REFLEX MICROSCOPIC
BILIRUBIN URINE: NEGATIVE
Glucose, UA: NEGATIVE mg/dL
Hgb urine dipstick: NEGATIVE
Ketones, ur: NEGATIVE mg/dL
LEUKOCYTES UA: NEGATIVE
NITRITE: NEGATIVE
Protein, ur: 30 mg/dL — AB
Specific Gravity, Urine: 1.02 (ref 1.005–1.030)
UROBILINOGEN UA: 0.2 mg/dL (ref 0.0–1.0)
pH: 5 (ref 5.0–8.0)

## 2014-02-09 LAB — URINE MICROSCOPIC-ADD ON

## 2014-02-09 LAB — LIPASE, BLOOD: LIPASE: 39 U/L (ref 11–59)

## 2014-02-09 LAB — LACTIC ACID, PLASMA: LACTIC ACID, VENOUS: 1.8 mmol/L (ref 0.5–2.2)

## 2014-02-09 MED ORDER — MORPHINE SULFATE 4 MG/ML IJ SOLN
8.0000 mg | Freq: Once | INTRAMUSCULAR | Status: AC
  Administered 2014-02-09: 8 mg via INTRAVENOUS
  Filled 2014-02-09: qty 2

## 2014-02-09 MED ORDER — GI COCKTAIL ~~LOC~~
30.0000 mL | Freq: Once | ORAL | Status: AC
  Administered 2014-02-09: 30 mL via ORAL
  Filled 2014-02-09: qty 30

## 2014-02-09 MED ORDER — IOHEXOL 300 MG/ML  SOLN
25.0000 mL | Freq: Once | INTRAMUSCULAR | Status: AC | PRN
  Administered 2014-02-09: 25 mL via ORAL

## 2014-02-09 MED ORDER — SODIUM CHLORIDE 0.9 % IV BOLUS (SEPSIS)
1000.0000 mL | Freq: Once | INTRAVENOUS | Status: AC
  Administered 2014-02-09: 1000 mL via INTRAVENOUS

## 2014-02-09 MED ORDER — IOHEXOL 300 MG/ML  SOLN
100.0000 mL | Freq: Once | INTRAMUSCULAR | Status: AC | PRN
  Administered 2014-02-09: 100 mL via INTRAVENOUS

## 2014-02-09 MED ORDER — ONDANSETRON HCL 4 MG/2ML IJ SOLN
4.0000 mg | Freq: Once | INTRAMUSCULAR | Status: AC
  Administered 2014-02-09: 4 mg via INTRAVENOUS
  Filled 2014-02-09: qty 2

## 2014-02-09 NOTE — Discharge Instructions (Signed)
We have determined that your problem requires further evaluation in the emergency department.  We will take care of your transport there.  Once at the emergency department, you will be evaluated by a provider and they will order whatever treatment or tests they deem necessary.  We cannot guarantee that they will do any specific test or do any specific treatment.  ° °

## 2014-02-09 NOTE — ED Notes (Signed)
PATIENT REPORTS WAKING UP NOT BEING ABLE TO "BURP" THIS MORNING AND FEELING CONSTIPATION.  SHE STARTED TO TAKE MEDICATIONS TO HELP HER PRODUCE A BOWEL MOVEMENT.  HER BREATHING IS BOTHERING HER AS WELL AND SHE IS REPORTING THIS AS NOT BEING IMPROVED.

## 2014-02-09 NOTE — ED Notes (Signed)
The patient was ambulating with assistance from thetech to the bedside commode and the lid to the commode cut her left posterior thigh. The tech Cleaned and applied a dressing to the cut. The tech reported to the RN in charge.

## 2014-02-09 NOTE — ED Notes (Signed)
Pt oxygen saturation dropped to 87%, pt placed on Nasal Cannula 2L for oxygen support. Pt o2 saturation now 100%. Pt axo x4. nad noted.

## 2014-02-09 NOTE — ED Notes (Signed)
Patient complains of some SOB Having some left side abd pain that she thought was Constipation Drank some water and soda to help with some indigestion Drank smoothie, prune juice epsom salts and uzness to help with  Constipation Now having watery stools but constipation is relieved Had a little bit of soup today

## 2014-02-09 NOTE — ED Provider Notes (Signed)
Chief Complaint   Chief Complaint  Patient presents with  . Shortness of Breath  . Abdominal Pain    History of Present Illness   Kim Mullins is an 80 year old female with diabetes who had left upper quadrant abdominal pain that began this morning. The pain began gradually. It's getting worse. It does not radiate. It feels like a burning ache. The patient was constipated. She took some smooth move tea, MiraLax, and Epsom salts, and now has diarrhea. There was no blood in the stool. She felt nauseated and vomited once. There was no blood in the vomitus. She denies any fever, chills, anorexia, chest pain, or dizziness. She's been short of breath for the past 2 weeks and this seems to be getting worse. She denies any coughing. She has had some wheezing. She was diagnosed as having asthma a week ago when she came in with the same symptoms. She's been on albuterol without any improvement.  Review of Systems   Other than as noted above, the patient denies any of the following symptoms: Constitutional:  No fever, chills, weight loss or anorexia. Abdomen:  No nausea, vomiting, hematememesis, melena, diarrhea, or hematochezia. GU:  No dysuria, frequency, urgency, or hematuria. Gyn:  No vaginal discharge, itching, abnormal bleeding, dyspareunia, or pelvic pain.  [place]   Past medical history, family history, social history, meds, and allergies were reviewed. She is allergic to penicillin. She takes meloxicam. She has diabetes.  Physical Exam     Vital signs:  BP 150/110  Pulse 85  Temp(Src) 98.8 F (37.1 C) (Oral)  Resp 24  SpO2 98% Gen:  Alert, oriented, she appears moderately uncomfortable. Lungs:  Breath sounds clear and equal bilaterally.  No wheezes, rales or rhonchi. Heart:  Regular rhythm.  No gallops or murmers.   Abdomen:  Soft, flat, and nondistended. No organomegaly or mass. Bowel sounds are not heard. There is mild left upper quadrant tenderness to palpation without  guarding or rebound. Skin:  Clear, warm and dry.  No rash.  EKG Results:  Date: 02/09/2014  Rate: 78  Rhythm: normal sinus rhythm  QRS Axis: left  Intervals: normal  ST/T Wave abnormalities: normal  Conduction Disutrbances:none  Narrative Interpretation: Normal sinus rhythm, left axis deviation, minimal voltage criteria for LVH. No change from previous EKG of 2 weeks ago.  Old EKG Reviewed: unchanged   Assessment   The primary encounter diagnosis was LUQ pain. A diagnosis of Shortness of breath was also pertinent to this visit.  My suspicion is that she has diverticulitis. Also disease or mesenteric thrombosis would also be possibilities. I am not certain as the cause of shortness of breath. Her lungs sound completely clear today and there is no wheezes. She denies any pleuritic component to the abdominal pain.  Plan     The patient was transferred to the ED via shuttle in stable condition.  Medical Decision Making:  80 year old female has a history since this morning of severe LUQ pain, nausea, and vomiting.  On exam she has mild tenderness, but no guarding or rebound.  BS not heard.  I am concerned about diverticulitis and I think she needs a CT scan.  She also complains of shortness of breath.  This has been going on for 2 weeks and is getting worse.  She was treated with albuterol without relief.  Her EKG today was normal.  She has no chest pain and her lungs sound completely clear.      Harden Mo,  MD 02/09/14 1620

## 2014-02-09 NOTE — ED Notes (Signed)
The pt has had sob for one month and  abd pain  Started this am she thought was constipation but she had a bm and the pain remained.  She was seen at [facility] and sent down here for treatment

## 2014-02-09 NOTE — ED Provider Notes (Signed)
CSN: [id number]     Arrival date & time 02/09/14  1623 History   First MD Initiated Contact with Patient 02/09/14 1636     Chief Complaint  Patient presents with  . sob and abd pain      (Consider location/radiation/quality/duration/timing/severity/associated sxs/prior Treatment) HPI Patient with two complaints.  1. Constipation this morning (chronic), didn't resolve until she had taken multiple rounds of laxative, teas, epsom salt. During her concoction consumption she started having LUQ abdominal pain. Has vomited twice since that time nbnb. Pain has not improved. Multiple rounds of non bloody diarrhea as well. No fevers, no h/o same.  2. Sob. 2 weeks. Not exertional or positional. Resolved with albuterol. No cp, nausea, diaphoresis associated with it. No pleuritic component.   Past Medical History  Diagnosis Date  . Bowel obstruction   . Left tibial fracture 2007  . Asthma     Allergixc reaction to cats.  . Anemia     occassionally  . Arthritis     all over.  . Diabetes mellitus 2006    Diet and exercise controlled.  Marland Kitchen GERD (gastroesophageal reflux disease)     occ  . Osteoarthritis of right shoulder region 06/26/2013   Past Surgical History  Procedure Laterality Date  . Abdominal hysterectomy  1969  . Dilation and curettage of uterus  1968  . Pilonidal cyst excision  1959  . Corn removal  1999    Bilateral feet  . Bunionectomy  1984    Bilateral  . Tonsillectomy  1942  . Eye surgery  1983    Surgery to fix Retainal detachment, bilateral  . Carpal tunnell  2004    Bilateral  . Total hip arthroplasty  11/16/2011    Procedure: TOTAL HIP ARTHROPLASTY ANTERIOR APPROACH;  Surgeon: Hessie Dibble, MD;  Location: [place];  Service: Orthopedics;  Laterality: Right;  DEPUY  . Total shoulder arthroplasty Right 06/26/2013    Procedure: TOTAL SHOULDER ARTHROPLASTY;  Surgeon: Johnny Bridge, MD;  Location: [place];  Service: Orthopedics;  Laterality: Right;   No family history  on file. History  Substance Use Topics  . Smoking status: Former Smoker -- 1.00 packs/day for 45 years    Types: Cigarettes    Quit date: 07/24/1978  . Smokeless tobacco: Not on file  . Alcohol Use: 4.2 oz/week    7 Glasses of wine per week   OB History   Grav Para Term Preterm Abortions TAB SAB Ect Mult Living                 Review of Systems  Constitutional: Negative for fever, chills and activity change.  HENT: Negative for congestion and facial swelling.   Eyes: Negative for discharge and redness.  Respiratory: Positive for shortness of breath. Negative for cough.   Cardiovascular: Negative for chest pain, palpitations and leg swelling.  Gastrointestinal: Positive for nausea, vomiting, abdominal pain, diarrhea and constipation. Negative for blood in stool, abdominal distention, anal bleeding and rectal pain.  Endocrine: Negative for polydipsia and polyuria.  Genitourinary: Negative for dysuria and menstrual problem.  Musculoskeletal: Negative for back pain and joint swelling.  Skin: Negative for color change and wound.  Neurological: Negative for dizziness, light-headedness and headaches.      Allergies  Eggs or egg-derived products; Penicillins; and Quinine derivatives  Home Medications   Prior to Admission medications   Medication Sig Start Date End Date Taking? Authorizing Provider  albuterol (PROVENTIL HFA;VENTOLIN HFA) 108 (90 BASE) MCG/ACT inhaler Inhale  1-2 puffs into the lungs every 6 (six) hours as needed for wheezing or shortness of breath. 01/26/14   Billy Fischer, MD  aspirin EC 81 MG tablet Take 81 mg by mouth daily.    Historical Provider, MD  bisacodyl (DULCOLAX) 5 MG EC tablet Take 5-10 mg by mouth daily as needed. For constipation    Historical Provider, MD  oxyCODONE-acetaminophen (ROXICET) 5-325 MG per tablet Take 1-2 tablets by mouth every 6 (six) hours as needed for pain. 06/26/13   Johnny Bridge, MD   BP 146/114  Pulse 76  Temp(Src) 97.9 F  (36.6 C) (Oral)  Resp 18  Ht 5\' 3"  (1.6 m)  Wt 185 lb 4.8 oz (84.052 kg)  BMI 32.83 kg/m2  SpO2 98% Physical Exam  Nursing note and vitals reviewed. Constitutional: She is oriented to person, place, and time. She appears well-developed and well-nourished.  HENT:  Head: Normocephalic and atraumatic.  Eyes: Conjunctivae and EOM are normal. Right eye exhibits no discharge. Left eye exhibits no discharge.  Cardiovascular: Normal rate and regular rhythm.   Pulmonary/Chest: Effort normal and breath sounds normal. No respiratory distress.  Abdominal: Soft. She exhibits no distension. There is no tenderness. There is no rebound.  Musculoskeletal: Normal range of motion. She exhibits no edema and no tenderness.  Neurological: She is alert and oriented to person, place, and time.  Skin: Skin is warm and dry.    ED Course  Procedures (including critical care time) Labs Review Labs Reviewed - No data to display  Imaging Review No results found.   EKG Interpretation None      MDM   Final diagnoses:  None    80 yo F w/ diet controlled DM sent here from urgent care secondary to luq abdominal pain, vomiting, diarrhea. Started while trying to relieve constipation. Exam without ttp, fever, tachycardia. Only red flag is pain out of proportion to exam making me concerned for mesenteric ischemia. Also possibly diverticulitis v gas. Will get CT, labs and give symptomatic control.   Symptoms improved. Ct with evidence of infiltrative process, concerning for infection v neoplasm. UA without evidence of infection. Concern for neoplasm, will have her fu w/ oncology. Also with evidence of fluid overload on cxr w/ cardiomegaly. Has SOB but no respiratory distress. Considered giving lasix but with borderline GFR and concern for kidney mass, thought it best not give Lasix until PCP and oncology could weigh in on problems. Vomiting resolved. Diarrhea still present, likely iatrogenic 2/2 multiple meds for  constipation. Patient and family updated. Stable for d/c.     Merrily Pew, MD 02/10/14 [phone]

## 2014-02-10 NOTE — ED Provider Notes (Signed)
I saw and evaluated the patient, reviewed the resident's note and I agree with the findings and plan.   EKG Interpretation   Date/Time:  Saturday February 09 2014 17:15:13 EDT Ventricular Rate:  80 PR Interval:  180 QRS Duration: 111 QT Interval:  418 QTC Calculation: 482 R Axis:   -56 Text Interpretation:  Sinus rhythm Left anterior fascicular block Probable  left ventricular hypertrophy Anterior Q waves, possibly due to LVH No  significant change since last tracing Confirmed by Abbrielle Batts  MD, Dasha Kawabata  [phone]) on 02/09/2014 6:21:10 PM      Pt with 1 day of LUQ/flank pain but soft abd without pain.  Labs wnl.  CT showed infiltrative process in the left kidney and no signs of infeciton in the UA.  Will refer to oncology  Blanchie Dessert, MD 02/10/14 [phone]

## 2014-02-11 ENCOUNTER — Telehealth: Payer: Self-pay | Admitting: Oncology

## 2014-02-11 NOTE — Telephone Encounter (Signed)
S/W PATIENT AND GAVE NP APPT FOR 06/23 @ 10:30 W/DR SHADAD. REFERRING ED WELCOME PACKET MAILED.

## 2014-02-19 ENCOUNTER — Ambulatory Visit: Payer: Medicare Other

## 2014-02-19 ENCOUNTER — Other Ambulatory Visit: Payer: Medicare Other

## 2014-02-19 ENCOUNTER — Encounter: Payer: Self-pay | Admitting: Oncology

## 2014-02-19 ENCOUNTER — Telehealth: Payer: Self-pay | Admitting: Oncology

## 2014-02-19 ENCOUNTER — Ambulatory Visit (HOSPITAL_BASED_OUTPATIENT_CLINIC_OR_DEPARTMENT_OTHER): Payer: Medicare Other | Admitting: Oncology

## 2014-02-19 VITALS — BP 106/71 | HR 86 | Temp 98.4°F | Resp 18 | Ht 63.0 in | Wt 173.3 lb

## 2014-02-19 DIAGNOSIS — R0609 Other forms of dyspnea: Secondary | ICD-10-CM

## 2014-02-19 DIAGNOSIS — N289 Disorder of kidney and ureter, unspecified: Secondary | ICD-10-CM

## 2014-02-19 DIAGNOSIS — N2889 Other specified disorders of kidney and ureter: Secondary | ICD-10-CM

## 2014-02-19 DIAGNOSIS — R0989 Other specified symptoms and signs involving the circulatory and respiratory systems: Secondary | ICD-10-CM

## 2014-02-19 NOTE — Progress Notes (Signed)
Please see consult note.  

## 2014-02-19 NOTE — Telephone Encounter (Signed)
pt sched to see Dr. Larwance Sachs on 7.9 @ 3pm printed pt appt sched and avs

## 2014-02-19 NOTE — Consult Note (Signed)
Reason for Referral: Kidney mass.   HPI: 80 year old woman native of [place] currently in [place] the last 30+ years. She is a rather healthy woman but does have history of diabetes and arthritis. She was in her normal state of health still she develops symptoms of dyspnea and insertion and constipation. Subsequently started developing abdominal pain and was seen in the medicine Department on 02/09/2014. Her evaluation including a chest x-ray which showed some interstitial findings but also had a CT scan of the abdomen and pelvis. Her CT scan showed an infiltrative process in the left kidney could represents infectious or malignant process. There is no evidence of any metastatic cancer anywhere. There is no other abnormalities noted patient referred to me for an evaluation.  Clinically, shears reporting improved symptoms in her abdomen. Is no longer reporting any abdominal pain. Her bowels are improved. She is not reporting any genitourinary complaints. Did not report any frequency urgency or hesitancy. Did not report any hematuria or dysuria. She has not reported any headaches or blurry vision or double vision. Is not reporting any motor sensory neuropathy or alteration of mental status. Did not report any syncope. Is that report any chest pain but does report dyspnea on exertion. He does report some occasional cough but no hemoptysis. Does not report any palpitation or lymphedema. She does not report any vomiting or nausea at this time. Does report any hematochezia or melena. Does not report any skeletal pain or diffuse arthralgias. Does report any rashes or lesions. Supportive lymphadenopathy. She continued to be very active and able to drive in attendance the activity of daily living. Rest of the review of systems unremarkable.   Past Medical History  Diagnosis Date  . Bowel obstruction   . Left tibial fracture 2007  . Asthma     Allergixc reaction to cats.  . Anemia     occassionally  .  Arthritis     all over.  . Diabetes mellitus 2006    Diet and exercise controlled.  Marland Kitchen GERD (gastroesophageal reflux disease)     occ  . Osteoarthritis of right shoulder region 06/26/2013  :  Past Surgical History  Procedure Laterality Date  . Abdominal hysterectomy  1969  . Dilation and curettage of uterus  1968  . Pilonidal cyst excision  1959  . Corn removal  1999    Bilateral feet  . Bunionectomy  1984    Bilateral  . Tonsillectomy  1942  . Eye surgery  1983    Surgery to fix Retainal detachment, bilateral  . Carpal tunnell  2004    Bilateral  . Total hip arthroplasty  11/16/2011    Procedure: TOTAL HIP ARTHROPLASTY ANTERIOR APPROACH;  Surgeon: Hessie Dibble, MD;  Location: [place];  Service: Orthopedics;  Laterality: Right;  DEPUY  . Total shoulder arthroplasty Right 06/26/2013    Procedure: TOTAL SHOULDER ARTHROPLASTY;  Surgeon: Johnny Bridge, MD;  Location: [place];  Service: Orthopedics;  Laterality: Right;  :   Current Outpatient Prescriptions  Medication Sig Dispense Refill  . albuterol (PROVENTIL HFA;VENTOLIN HFA) 108 (90 BASE) MCG/ACT inhaler Inhale 1-2 puffs into the lungs every 6 (six) hours as needed for wheezing or shortness of breath.  1 Inhaler  1  . aspirin EC 81 MG tablet Take 81 mg by mouth daily.      . calcium carbonate (OS-CAL) 600 MG TABS tablet Take 600 mg by mouth daily with breakfast.      . meloxicam (MOBIC) 15 MG tablet  Take 15 mg by mouth daily.       No current facility-administered medications for this visit.      Allergies  Allergen Reactions  . Hydrocodone Other (See Comments)    Too away all energy and made her stop eating food for short time  . Percocet [Oxycodone-Acetaminophen] Other (See Comments)    Too away all energy and made her stop eating food for short time  . Eggs Or Egg-Derived Products Hives and Rash  . Penicillins Hives and Rash  . Quinine Derivatives Hives and Rash  :  No family history on file.:  History    Social History  . Marital Status: Widowed    Spouse Name: N/A    Number of Children: N/A  . Years of Education: N/A   Occupational History  . Not on file.   Social History Main Topics  . Smoking status: Former Smoker -- 1.00 packs/day for 45 years    Types: Cigarettes    Quit date: 07/24/1978  . Smokeless tobacco: Not on file  . Alcohol Use: 4.2 oz/week    7 Glasses of wine per week  . Drug Use: No  . Sexual Activity: Not on file   Other Topics Concern  . Not on file   Social History Narrative  . No narrative on file  :  Pertinent items are noted in HPI.  Exam: Blood pressure 106/71, pulse 86, temperature 98.4 F (36.9 C), resp. rate 18, height 5\' 3"  (1.6 m), weight 173 lb 4.8 oz (78.608 kg). General appearance: alert and cooperative Head: Normocephalic, without obvious abnormality Throat: lips, mucosa, and tongue normal; teeth and gums normal Neck: no adenopathy Back: symmetric, no curvature. ROM normal. No CVA tenderness. Resp: clear to auscultation bilaterally and  slight rhonchi the basis. The dose to percussion Cardio: regular rate and rhythm, S1, S2 normal, no murmur, click, rub or gallop GI: soft, non-tender; bowel sounds normal; no masses,  no organomegaly Extremities: extremities normal, atraumatic, no cyanosis or edema Pulses: 2+ and symmetric Lymph nodes: Cervical, supraclavicular, and axillary nodes normal.    Dg Chest 2 View  02/09/2014   CLINICAL DATA:  Shortness of breath.  EXAM: CHEST  2 VIEW  COMPARISON:  01/26/2014  FINDINGS: Cardiomegaly. Diffuse interstitial prominence throughout the lungs has increased since prior study, likely reflecting interstitial edema. Trace bilateral pleural effusions. Prior right shoulder replacement. Degenerative changes in the left shoulder. No acute bony abnormality.  IMPRESSION: New interstitial prominence within the lungs, likely interstitial edema. Trace bilateral effusions.   Electronically Signed   By: Rolm Baptise M.D.   On: 02/09/2014 19:07   Ct Abdomen Pelvis W Contrast  02/09/2014   CLINICAL DATA:  Pain.  EXAM: CT ABDOMEN AND PELVIS WITH CONTRAST  TECHNIQUE: Multidetector CT imaging of the abdomen and pelvis was performed using the standard protocol following bolus administration of intravenous contrast.  CONTRAST:  132mL OMNIPAQUE IOHEXOL 300 MG/ML  SOLN  COMPARISON:  None.  FINDINGS: Diffuse fatty infiltration of the liver. Small enhancing nodules are noted in the spleen. Although metastatic disease cannot be excluded. These statistically most likely represent benign vascular lesions. Splenosis. Portal vein is patent.  Adrenals are normal. Right kidney is unremarkable. A diffuse infiltrative process is present in the left kidney primarily involving the medial and lower portions of the left kidney. This could represent active infection and/or tumor. The renal vein and renal artery are patent. Adjacent 3.3 cm simple cyst is present. No hydronephrosis. No evidence of obstructing  ureteral stone. The bladder is nondistended. Pelvic phleboliths. Hysterectomy. No prominent pelvic fluid collections.  No inguinal or retroperitoneal adenopathy. Abdominal aorta and visceral vessels patent. No aneurysm.  Appendix normal. Fluid is noted throughout the colon. Diarrheal illness could present in this fashion. No evidence of bowel obstruction. No small bowel distention. Stomach is nondistended. No significant hernia.  Bibasilar subsegmental atelectasis. Small pleural effusions. Cardiomegaly. Coronary artery disease cannot be excluded. No acute bony abnormality. Right hip replacement. Degenerative changes lumbar spine.  IMPRESSION: 1. Infiltrate process left kidney. This could be infectious or malignant. Infarct is less likely as the renal vasculature is widely patent. 2. Fluid noted throughout the colon. Diarrheal illness could present this fashion. No no evidence of bowel obstruction. 3. Small enhancing nodules in the spleen,  although metastatic disease to the spleen cannot be excluded. These statistically most likely represent benign vascular lesions. 4. Cardiomegaly.  Coronary artery disease. 5. Bibasilar subsegmental atelectasis and small pleural effusions.   Electronically Signed   By: Marcello Moores  Register   On: 02/09/2014 19:37    Assessment and Plan:    80 year old woman with the following issues  1. Infiltrative process in the left kidney diagnosed on CT scan on 02/09/2014. The differential diagnosis was discussed with the patient today including a neoplasm versus infection. Her CT scan of the abdomen and pelvis as well as a chest x-ray did not reveal any clear-cut evidence of malignancy. She does not have any other genitourinary complaints at this time to suggest clear-cut renal cell carcinoma. Management options were discussed today with the patient including repeating imaging studies such as an MRI as well as urology referral. I feel given the fact that her disease is indeed malignant, is confined to the kidney and she will need a discussion with urology regarding the treatment options. I will make the referral for her to see [facility] in the near future and I will be happy to assist in her care in the future as needed.  2. Dyspnea on exertion and possible effusion. She could have a cardiac etiology or a possible bronchitis. I will defer that to her primary care physician for management.  All her questions are answered today and will be happy to see her in the future as needed.

## 2014-02-20 ENCOUNTER — Telehealth: Payer: Self-pay | Admitting: Oncology

## 2014-02-20 NOTE — Telephone Encounter (Signed)
Faxed pt medical records to Dr. Larwance Sachs

## 2014-03-08 ENCOUNTER — Other Ambulatory Visit (HOSPITAL_COMMUNITY): Payer: Self-pay | Admitting: Urology

## 2014-03-08 DIAGNOSIS — D49519 Neoplasm of unspecified behavior of unspecified kidney: Secondary | ICD-10-CM

## 2014-03-21 ENCOUNTER — Ambulatory Visit (HOSPITAL_COMMUNITY)
Admission: RE | Admit: 2014-03-21 | Discharge: 2014-03-21 | Disposition: A | Discharge status: Home / Self Care | Payer: Medicare Other | Source: Ambulatory Visit | Attending: Urology | Admitting: Urology

## 2014-03-21 DIAGNOSIS — D739 Disease of spleen, unspecified: Secondary | ICD-10-CM | POA: Diagnosis not present

## 2014-03-21 DIAGNOSIS — N281 Cyst of kidney, acquired: Secondary | ICD-10-CM | POA: Diagnosis not present

## 2014-03-21 DIAGNOSIS — R109 Unspecified abdominal pain: Secondary | ICD-10-CM | POA: Diagnosis present

## 2014-03-21 DIAGNOSIS — D49519 Neoplasm of unspecified behavior of unspecified kidney: Secondary | ICD-10-CM

## 2014-03-21 DIAGNOSIS — N289 Disorder of kidney and ureter, unspecified: Secondary | ICD-10-CM | POA: Insufficient documentation

## 2014-03-21 MED ORDER — GADOBENATE DIMEGLUMINE 529 MG/ML IV SOLN
20.0000 mL | Freq: Once | INTRAVENOUS | Status: AC | PRN
  Administered 2014-03-21: 16 mL via INTRAVENOUS

## 2014-04-29 ENCOUNTER — Encounter: Payer: Self-pay | Admitting: Internal Medicine

## 2014-04-29 ENCOUNTER — Ambulatory Visit (INDEPENDENT_AMBULATORY_CARE_PROVIDER_SITE_OTHER)
Admission: RE | Admit: 2014-04-29 | Discharge: 2014-04-29 | Disposition: A | Discharge status: Home / Self Care | Payer: Medicare Other | Source: Ambulatory Visit | Attending: Internal Medicine | Admitting: Internal Medicine

## 2014-04-29 ENCOUNTER — Ambulatory Visit (INDEPENDENT_AMBULATORY_CARE_PROVIDER_SITE_OTHER): Payer: Medicare Other | Admitting: Internal Medicine

## 2014-04-29 ENCOUNTER — Encounter (INDEPENDENT_AMBULATORY_CARE_PROVIDER_SITE_OTHER): Payer: Self-pay

## 2014-04-29 ENCOUNTER — Other Ambulatory Visit (INDEPENDENT_AMBULATORY_CARE_PROVIDER_SITE_OTHER): Payer: Medicare Other

## 2014-04-29 VITALS — BP 110/80 | HR 85 | Temp 98.8°F | Ht 62.5 in | Wt 177.0 lb

## 2014-04-29 DIAGNOSIS — R0600 Dyspnea, unspecified: Secondary | ICD-10-CM

## 2014-04-29 DIAGNOSIS — R0609 Other forms of dyspnea: Secondary | ICD-10-CM

## 2014-04-29 DIAGNOSIS — R0989 Other specified symptoms and signs involving the circulatory and respiratory systems: Secondary | ICD-10-CM

## 2014-04-29 LAB — BRAIN NATRIURETIC PEPTIDE: PRO B NATRI PEPTIDE: 220 pg/mL — AB (ref 0.0–100.0)

## 2014-04-29 LAB — CBC WITH DIFFERENTIAL/PLATELET
Basophils Absolute: 0.1 10*3/uL (ref 0.0–0.1)
Basophils Relative: 1 % (ref 0.0–3.0)
Eosinophils Absolute: 0.3 10*3/uL (ref 0.0–0.7)
Eosinophils Relative: 5.8 % — ABNORMAL HIGH (ref 0.0–5.0)
HEMATOCRIT: 38 % (ref 36.0–46.0)
HEMOGLOBIN: 12.1 g/dL (ref 12.0–15.0)
LYMPHS ABS: 2.6 10*3/uL (ref 0.7–4.0)
Lymphocytes Relative: 45.8 % (ref 12.0–46.0)
MCHC: 31.8 g/dL (ref 30.0–36.0)
MCV: 87 fl (ref 78.0–100.0)
MONO ABS: 0.5 10*3/uL (ref 0.1–1.0)
MONOS PCT: 8.2 % (ref 3.0–12.0)
NEUTROS ABS: 2.2 10*3/uL (ref 1.4–7.7)
Neutrophils Relative %: 39.2 % — ABNORMAL LOW (ref 43.0–77.0)
PLATELETS: 282 10*3/uL (ref 150.0–400.0)
RBC: 4.37 Mil/uL (ref 3.87–5.11)
RDW: 15.2 % (ref 11.5–15.5)
WBC: 5.6 10*3/uL (ref 4.0–10.5)

## 2014-04-29 LAB — TSH: TSH: 1.46 u[IU]/mL (ref 0.35–4.50)

## 2014-04-29 MED ORDER — FAMOTIDINE 20 MG PO TABS: ORAL_TABLET | ORAL | Status: DC

## 2014-04-29 MED ORDER — PANTOPRAZOLE SODIUM 40 MG PO TBEC: 40.0000 mg | DELAYED_RELEASE_TABLET | Freq: Every day | ORAL | Status: DC

## 2014-04-29 NOTE — Patient Instructions (Addendum)
Please remember to go to the lab and x-ray department downstairs for your tests - we will call you with the results when they are available.  Only use your albuterol (ventolin)  as a rescue medication to be used if you can't catch your breath by resting or doing a relaxed purse lip breathing pattern.  - The less you use it, the better it will work when you need it. - Ok to use up to 2 puffs  every 4 hours if you must but call for immediate appointment if use goes up over your usual need - Don't leave home without it !!  (think of it like the spare tire for your car)   Pantoprazole (protonix) 40 mg   Take 30-60 min before first meal of the day and Pepcid 20 mg one bedtime until return to office - this is the best way to tell whether stomach acid is contributing to your problem.    GERD (REFLUX)  is an extremely common cause of respiratory symptoms, many times with no significant heartburn at all.    It can be treated with medication, but also with lifestyle changes including avoidance of late meals, excessive alcohol, smoking cessation, and avoid fatty foods, chocolate, peppermint, colas, red wine, and acidic juices such as orange juice.  NO MINT OR MENTHOL PRODUCTS SO NO COUGH DROPS  USE SUGARLESS CANDY INSTEAD (jolley ranchers or Stover's)  NO OIL BASED VITAMINS - use powdered substitutes.   Please schedule a follow up office visit in 4 weeks, sooner if needed  Late add: due to pos d dimer needs venous doppers/ CTa ordered am 04/30/2014

## 2014-04-29 NOTE — Progress Notes (Signed)
Subjective:    Patient ID: Kim Mullins, female    DOB: 1929/10/31  MRN: [id number]  HPI  89 yobf quit smoking 1979 with hx seasonal   rhinitis but not cough /  Did wheeze and cough with cat exposure remotely but otherwise no asthma, and did fine until Jan 26 2014 with abrupt onset of cough/ wheeze/ sob  > self referred to pulmonary clinic 04/29/2014   04/29/2014 1st [facility] office visit/ Kim Mullins  Chief Complaint  Patient presents with  . Pulmonary Consult    Self referral. Pt c/o wheezing and SOB for the past 3 months. She states that she gets SOB with minimal exertion such as walking from room to room.  She also c/o non prod cough- esp in the am. She is taking albuterol approx 3 times per day.  worse sob symptoms when get up in am but they don't typically wake her. Unclear why but much better x 2 days prior to OV   and did not use saba on day of ov- sob assoc with dry coughing fits - alb does seem to help but technique is poor (see a/p)   No obvious other patterns in day to day or daytime variabilty or assoc   or cp or chest tightness, subjective wheeze overt sinus or hb symptoms. No unusual exp hx or h/o childhood pna/ asthma or knowledge of premature birth.  Sleeping ok without nocturnal  or early am exacerbation  of respiratory  c/o's or need for noct saba. Also denies any obvious fluctuation of symptoms with weather or environmental changes or other aggravating or alleviating factors except as outlined above   Current Medications, Allergies, Complete Past Medical History, Past Surgical History, Family History, and Social History were reviewed in [employer] record.           Review of Systems  Constitutional: Negative for fever, chills and unexpected weight change.  HENT: Positive for rhinorrhea. Negative for congestion, dental problem, ear pain, nosebleeds, postnasal drip, sinus pressure, sneezing, sore throat, trouble swallowing and voice change.    Eyes: Negative for visual disturbance.  Respiratory: Positive for cough and shortness of breath. Negative for choking.   Cardiovascular: Negative for chest pain and leg swelling.  Gastrointestinal: Negative for vomiting, abdominal pain and diarrhea.  Genitourinary: Negative for difficulty urinating.  Musculoskeletal: Positive for arthralgias.  Skin: Negative for rash.  Neurological: Negative for tremors, syncope and headaches.  Hematological: Does not bruise/bleed easily.       Objective:   Physical Exam  Wt Readings from Last 3 Encounters:  04/29/14 177 lb (80.287 kg)  02/19/14 173 lb 4.8 oz (78.608 kg)  02/09/14 185 lb 4.8 oz (84.052 kg)     Delightful amb bf nad  HEENT: nl dentition, turbinates, and orophanx. Nl external ear canals without cough reflex   NECK :  without JVD/Nodes/TM/ nl carotid upstrokes bilaterally   LUNGS: no acc muscle use, clear to A and P bilaterally without cough on insp or exp maneuvers   CV:  RRR  no s3 or murmur or increase in P2, no edema   ABD:  soft and nontender with nl excursion in the supine position. No bruits or organomegaly, bowel sounds nl  MS:  warm without deformities, calf tenderness, cyanosis or clubbing  SKIN: warm and dry without lesions    NEURO:  alert, approp, no deficits    Lab Results  Component Value Date   DDIMER 2.19* 04/29/2014     CXR  04/29/2014 :  No active cardiopulmonary disease.   Recent Labs Lab 04/29/14 1603  HGB 12.1  HCT 38.0  WBC 5.6  PLT 282.0       Chemistry      Component Value Date/Time   NA 139 02/09/2014 1710   K 4.6 02/09/2014 1710   CL 100 02/09/2014 1710   CO2 21 02/09/2014 1710   BUN 18 02/09/2014 1710   CREATININE 0.91 02/09/2014 1710      Component Value Date/Time   CALCIUM 8.9 02/09/2014 1710   ALKPHOS 81 02/09/2014 1710   AST 83* 02/09/2014 1710   ALT 68* 02/09/2014 1710   BILITOT 0.8 02/09/2014 1710       Lab Results  Component Value Date   PROBNP 220.0* 04/29/2014     Lab Results  Component Value Date   TSH 1.46 04/29/2014       Assessment & Plan:

## 2014-04-29 NOTE — Progress Notes (Signed)
Quick Note:  LMTCB ______ 

## 2014-04-29 NOTE — Assessment & Plan Note (Addendum)
-   spirometry 04/29/2014  No obstruction  - 04/29/2014   Walked RA x one lap @ 185 stopped due to  Sob, no desat  Symptoms are markedly disproportionate to objective findings and not clear this is a lung problem but pt does appear to have difficult airway management issues.   DDX of  difficult airways management all start with A and  include Adherence, Ace Inhibitors, Acid Reflux, Active Sinus Disease, Alpha 1 Antitripsin deficiency, Anxiety masquerading as Airways dz,  ABPA,  allergy(esp in young), Aspiration (esp in elderly), Adverse effects of DPI,  Active smokers, plus two Bs  = Bronchiectasis and Beta blocker use..and one C= CHF  Adherence is always the initial "prime suspect" and is a multilayered concern that requires a "trust but verify" approach in every patient - starting with knowing how to use medications, especially inhalers, correctly, keeping up with refills and understanding the fundamental difference between maintenance and prns vs those medications only taken for a very short course and then stopped and not refilled.  - The proper method of use, as well as anticipated side effects, of a metered-dose inhaler are discussed and demonstrated to the patient. Improved effectiveness after extensive coaching during this visit to a level of approximately  75% from a baseline of < 25% : ok to use albuterol prn until sort out what's going on her as s any saba today she has no airflow obst    ? Acid (or non-acid) GERD > always difficult to exclude as up to 75% of pts in some series report no assoc GI/ Heartburn symptoms> rec max (24h)  acid suppression and diet restrictions/ reviewed and instructions given in writing.   ? Chf/ diastolic/ ? R sided ? Occult PE > the abrupt onset and elevated d dimer in the setting of nl airflow obstruction (no alternative dx) all indicate need for CTa/ venous dopplers to complete the w/u   See instructions for specific recommendations which were reviewed directly  with the patient who was given a copy with highlighter outlining the key components.

## 2014-04-30 ENCOUNTER — Other Ambulatory Visit: Payer: Self-pay | Admitting: Internal Medicine

## 2014-04-30 ENCOUNTER — Other Ambulatory Visit (INDEPENDENT_AMBULATORY_CARE_PROVIDER_SITE_OTHER): Payer: Medicare Other

## 2014-04-30 ENCOUNTER — Encounter (HOSPITAL_COMMUNITY): Payer: Medicare Other

## 2014-04-30 ENCOUNTER — Institutional Professional Consult (permissible substitution): Payer: Medicare Other | Admitting: Internal Medicine

## 2014-04-30 DIAGNOSIS — R0609 Other forms of dyspnea: Secondary | ICD-10-CM

## 2014-04-30 DIAGNOSIS — R0600 Dyspnea, unspecified: Secondary | ICD-10-CM

## 2014-04-30 DIAGNOSIS — R0989 Other specified symptoms and signs involving the circulatory and respiratory systems: Secondary | ICD-10-CM

## 2014-04-30 LAB — BASIC METABOLIC PANEL
BUN: 19 mg/dL (ref 6–23)
CO2: 27 mEq/L (ref 19–32)
Calcium: 8.8 mg/dL (ref 8.4–10.5)
Chloride: 107 mEq/L (ref 96–112)
Creatinine, Ser: 1 mg/dL (ref 0.4–1.2)
GFR: 69.93 mL/min (ref >60.00)
Glucose, Bld: 118 mg/dL — ABNORMAL HIGH (ref 70–99)
POTASSIUM: 4.9 meq/L (ref 3.5–5.1)
Sodium: 140 mEq/L (ref 135–145)

## 2014-04-30 LAB — D-DIMER, QUANTITATIVE: D-Dimer, Quant: 2.19 ug/mL-FEU — ABNORMAL HIGH (ref 0.00–0.48)

## 2014-04-30 NOTE — Progress Notes (Signed)
Quick Note:  Spoke with pt and notified of results per Dr. Wert. Pt verbalized understanding and denied any questions.  ______ 

## 2014-05-01 ENCOUNTER — Encounter (HOSPITAL_COMMUNITY): Payer: Medicare Other

## 2014-05-01 ENCOUNTER — Encounter: Payer: Self-pay | Admitting: Internal Medicine

## 2014-05-01 ENCOUNTER — Ambulatory Visit (INDEPENDENT_AMBULATORY_CARE_PROVIDER_SITE_OTHER)
Admission: RE | Admit: 2014-05-01 | Discharge: 2014-05-01 | Disposition: A | Discharge status: Home / Self Care | Payer: Medicare Other | Source: Ambulatory Visit | Attending: Internal Medicine | Admitting: Internal Medicine

## 2014-05-01 DIAGNOSIS — R0600 Dyspnea, unspecified: Secondary | ICD-10-CM

## 2014-05-01 DIAGNOSIS — R0989 Other specified symptoms and signs involving the circulatory and respiratory systems: Secondary | ICD-10-CM

## 2014-05-01 DIAGNOSIS — R0609 Other forms of dyspnea: Secondary | ICD-10-CM

## 2014-05-01 MED ORDER — IOHEXOL 350 MG/ML SOLN
80.0000 mL | Freq: Once | INTRAVENOUS | Status: AC | PRN
  Administered 2014-05-01: 80 mL via INTRAVENOUS

## 2014-05-02 ENCOUNTER — Ambulatory Visit (HOSPITAL_COMMUNITY): Payer: Medicare Other | Attending: Cardiology | Admitting: Cardiology

## 2014-05-02 DIAGNOSIS — R0602 Shortness of breath: Secondary | ICD-10-CM | POA: Diagnosis present

## 2014-05-02 DIAGNOSIS — Z87891 Personal history of nicotine dependence: Secondary | ICD-10-CM | POA: Insufficient documentation

## 2014-05-02 DIAGNOSIS — R0600 Dyspnea, unspecified: Secondary | ICD-10-CM

## 2014-05-02 NOTE — Progress Notes (Signed)
Quick Note:  LMTCB ______ 

## 2014-05-02 NOTE — Progress Notes (Signed)
Bilateral lower venous duplex performed  

## 2014-05-03 ENCOUNTER — Encounter: Payer: Self-pay | Admitting: Internal Medicine

## 2014-05-03 NOTE — Progress Notes (Signed)
Quick Note:  LMTCB ______ 

## 2014-05-09 NOTE — Progress Notes (Signed)
Quick Note:  lmtcb ______ 

## 2014-05-09 NOTE — Progress Notes (Signed)
Quick Note:  LMTCB ______ 

## 2014-05-10 NOTE — Progress Notes (Signed)
Quick Note:  LMTCB ______ 

## 2014-05-13 ENCOUNTER — Encounter: Payer: Self-pay | Admitting: *Deleted

## 2014-05-13 NOTE — Progress Notes (Signed)
Quick Note:  Letter mailed to the pt to have her call for results ______

## 2014-05-13 NOTE — Progress Notes (Signed)
Quick Note:  Letter mailed to the pt ______

## 2014-05-14 ENCOUNTER — Telehealth: Payer: Self-pay | Admitting: Internal Medicine

## 2014-05-14 DIAGNOSIS — R0600 Dyspnea, unspecified: Secondary | ICD-10-CM

## 2014-05-14 NOTE — Telephone Encounter (Signed)
Notes Recorded by Tanda Rockers, MD on 05/01/2014 at 7:09 PM Call patient : Study is unremarkable for any lung problems but there is a definite problem with her esophagus that is likely contributing to breathing problems > rec GI consult at her convenience but no change in rx in meantime    I spoke with the pt and notified of results/recs  She verbalized understanding and okay with referral  Order was sent to [facility]

## 2014-05-21 ENCOUNTER — Encounter: Payer: Self-pay | Admitting: Gastroenterology

## 2014-05-27 ENCOUNTER — Ambulatory Visit: Payer: Medicare Other | Admitting: Internal Medicine

## 2014-05-29 ENCOUNTER — Encounter: Payer: Self-pay | Admitting: Gastroenterology

## 2014-05-29 ENCOUNTER — Ambulatory Visit (INDEPENDENT_AMBULATORY_CARE_PROVIDER_SITE_OTHER): Payer: Medicare Other | Admitting: Gastroenterology

## 2014-05-29 ENCOUNTER — Ambulatory Visit: Payer: Medicare Other | Admitting: Gastroenterology

## 2014-05-29 VITALS — BP 114/80 | HR 80 | Ht 62.5 in | Wt 182.0 lb

## 2014-05-29 DIAGNOSIS — R05 Cough: Secondary | ICD-10-CM

## 2014-05-29 DIAGNOSIS — R0989 Other specified symptoms and signs involving the circulatory and respiratory systems: Secondary | ICD-10-CM

## 2014-05-29 DIAGNOSIS — R0609 Other forms of dyspnea: Secondary | ICD-10-CM

## 2014-05-29 DIAGNOSIS — R0600 Dyspnea, unspecified: Secondary | ICD-10-CM

## 2014-05-29 DIAGNOSIS — R059 Cough, unspecified: Secondary | ICD-10-CM

## 2014-05-29 DIAGNOSIS — R933 Abnormal findings on diagnostic imaging of other parts of digestive tract: Secondary | ICD-10-CM | POA: Insufficient documentation

## 2014-05-29 NOTE — Assessment & Plan Note (Signed)
Patient's wheezing and dyspnea could be do to solids as reflux although absence of response to combined PPI therapy and H2 receptor antagonist therapy renders this less likely.  Note CT scan that demonstrated air-fluid in the esophagus which at least raises this question, however.  Recommendations #1 upper endoscopy.  If no abnormalities are seen I will place a bravo pH probe study for 48 hours while the patient is holding acid suppression medication

## 2014-05-29 NOTE — Patient Instructions (Signed)
Your procedure has been scheduled at [facility] Separate instructions have been given Hold Protonix and Pepcid 24 hours before your test

## 2014-05-29 NOTE — Addendum Note (Signed)
Addended by: Oda Kilts on: 05/29/2014 09:05 AM   Modules accepted: Orders

## 2014-05-29 NOTE — Assessment & Plan Note (Signed)
Air and fluid in the esophagus raise the question of a distal esophageal lesion.  Patient denies dysphagia.  Recommendations #1 upper endoscopy

## 2014-05-29 NOTE — Progress Notes (Signed)
_                                                                                                                History of Present Illness:  Kim Mullins is a pleasant 80 year old Afro-American female with diabetes and history of GERD referred for evaluation of shortness of breath.  Over the past 5 months she has developed dyspnea on exertion and wheezing.  Concern for has reflux prompted therapy with protonix and Pepcid the patient has had no significant improvement.  She denies dysphagia or pyrosis.  CT scan from earlier this month demonstrated air and fluid in the esophagus raising the question of a distal esophageal obstruction.  Several months ago she developed left-sided abdominal pain.  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  Patient complains of mild, chronic constipation.   Past Medical History  Diagnosis Date  . Bowel obstruction   . Left tibial fracture 2007  . Asthma     Allergixc reaction to cats.  . Anemia     occassionally  . Arthritis     all over.  . Diabetes mellitus 2006    Diet and exercise controlled.  Marland Kitchen GERD (gastroesophageal reflux disease)     occ  . Osteoarthritis of right shoulder region 06/26/2013   Past Surgical History  Procedure Laterality Date  . Abdominal hysterectomy  1969  . Dilation and curettage of uterus  1968  . Pilonidal cyst excision  1959  . Corn removal  1999    Bilateral feet  . Bunionectomy  1984    Bilateral  . Tonsillectomy  1942  . Eye surgery  1983    Surgery to fix Retainal detachment, bilateral  . Carpal tunnell  2004    Bilateral  . Total hip arthroplasty  11/16/2011    Procedure: TOTAL HIP ARTHROPLASTY ANTERIOR APPROACH;  Surgeon: Hessie Dibble, MD;  Location: [place];  Service: Orthopedics;  Laterality: Right;  DEPUY  . Total shoulder arthroplasty Right 06/26/2013    Procedure: TOTAL SHOULDER ARTHROPLASTY;  Surgeon: Johnny Bridge, MD;  Location: [place];  Service: Orthopedics;  Laterality: Right;     family history includes Anuerysm in her daughter; Cancer in her father; Coronary artery disease in her mother; Dementia in her mother; Diabetes in her maternal grandfather. Current Outpatient Prescriptions  Medication Sig Dispense Refill  . albuterol (PROVENTIL HFA;VENTOLIN HFA) 108 (90 BASE) MCG/ACT inhaler Inhale 1-2 puffs into the lungs every 6 (six) hours as needed for wheezing or shortness of breath.  1 Inhaler  1  . aspirin EC 81 MG tablet Take 81 mg by mouth daily.      . famotidine (PEPCID) 20 MG tablet One at bedtime  30 tablet  2  . meloxicam (MOBIC) 15 MG tablet Take 15 mg by mouth daily.      . pantoprazole (PROTONIX) 40 MG tablet Take 1 tablet (40 mg total) by mouth daily. Take 30-60 min before first meal of the day  30 tablet  2  No current facility-administered medications for this visit.   Allergies as of 05/29/2014 - Review Complete 05/29/2014  Allergen Reaction Noted  . Hydrocodone Other (See Comments) 02/09/2014  . Percocet [oxycodone-acetaminophen] Other (See Comments) 02/09/2014  . Mercurial derivatives  05/29/2014  . Eggs or egg-derived products Hives and Rash 11/02/2011  . Penicillins Hives and Rash 11/02/2011  . Quinine derivatives Hives and Rash 11/02/2011    reports that she quit smoking about 35 years ago. Her smoking use included Cigarettes. She has a 45 pack-year smoking history. She has never used smokeless tobacco. She reports that she drinks about 4.2 ounces of alcohol per week. She reports that she does not use illicit drugs.   Review of Systems: Pertinent positive and negative review of systems were noted in the above HPI section. All other review of systems were otherwise negative.  Vital signs were reviewed in today's medical record Physical Exam: General: Well developed , well nourished, no acute distress Skin: anicteric Head: Normocephalic and atraumatic Eyes:  sclerae anicteric, EOMI Ears: Normal auditory acuity Mouth: No deformity or  lesions Neck: Supple, no masses or thyromegaly Lungs: Clear throughout to auscultation Heart: Regular rate and rhythm; no murmurs, rubs or bruits Abdomen: Soft, non tender and non distended. No masses, hepatosplenomegaly or hernias noted. Normal Bowel sounds.  There is no succussion splash Rectal:deferred Musculoskeletal: Symmetrical with no gross deformities  Skin: No lesions on visible extremities Pulses:  Normal pulses noted Extremities: No clubbing, cyanosis, edema or deformities noted Neurological: Alert oriented x 4, grossly nonfocal Cervical Nodes:  No significant cervical adenopathy Inguinal Nodes: No significant inguinal adenopathy Psychological:  Alert and cooperative. Normal mood and affect  See Assessment and Plan under Problem List

## 2014-05-30 ENCOUNTER — Encounter (HOSPITAL_COMMUNITY): Payer: Self-pay | Admitting: *Deleted

## 2014-06-03 ENCOUNTER — Ambulatory Visit: Payer: Medicare Other | Admitting: Internal Medicine

## 2014-06-10 ENCOUNTER — Encounter (HOSPITAL_COMMUNITY): Payer: Self-pay | Admitting: Anesthesiology

## 2014-06-10 ENCOUNTER — Encounter (HOSPITAL_COMMUNITY): Payer: Self-pay | Admitting: Pharmacy Technician

## 2014-06-10 NOTE — Anesthesia Preprocedure Evaluation (Deleted)
Anesthesia Evaluation  Patient identified by MRN, date of birth, ID band Patient awake    Reviewed: Allergy & Precautions, H&P , NPO status , Patient's Chart, lab work & pertinent test results  History of Anesthesia Complications Negative for: history of anesthetic complications  Airway Mallampati: II TM Distance: >3 FB Neck ROM: Full    Dental no notable dental hx.    Pulmonary shortness of breath, asthma , former smoker,  breath sounds clear to auscultation  Pulmonary exam normal       Cardiovascular negative cardio ROS  Rhythm:Regular Rate:Normal     Neuro/Psych negative neurological ROS  negative psych ROS   GI/Hepatic Neg liver ROS, GERD-  Medicated and Controlled,  Endo/Other  diabetes, Type 2  Renal/GU negative Renal ROS  negative genitourinary   Musculoskeletal  (+) Arthritis -, Osteoarthritis,    Abdominal   Peds negative pediatric ROS (+)  Hematology negative hematology ROS (+)   Anesthesia Other Findings   Reproductive/Obstetrics negative OB ROS                           Anesthesia Physical Anesthesia Plan  ASA: II  Anesthesia Plan: MAC   Post-op Pain Management:    Induction: Intravenous  Airway Management Planned: Nasal Cannula  Additional Equipment:   Intra-op Plan:   Post-operative Plan: Extubation in OR  Informed Consent: I have reviewed the patients History and Physical, chart, labs and discussed the procedure including the risks, benefits and alternatives for the proposed anesthesia with the patient or authorized representative who has indicated his/her understanding and acceptance.   Dental advisory given  Plan Discussed with: CRNA  Anesthesia Plan Comments:         Anesthesia Quick Evaluation

## 2014-06-11 ENCOUNTER — Encounter (HOSPITAL_COMMUNITY)
Admission: RE | Disposition: A | Discharge status: Home / Self Care | Payer: Self-pay | Source: Ambulatory Visit | Attending: Gastroenterology

## 2014-06-11 ENCOUNTER — Encounter (HOSPITAL_COMMUNITY): Payer: Self-pay | Admitting: *Deleted

## 2014-06-11 ENCOUNTER — Ambulatory Visit (HOSPITAL_COMMUNITY)
Admission: RE | Admit: 2014-06-11 | Discharge: 2014-06-11 | Disposition: A | Discharge status: Home / Self Care | Payer: Medicare Other | Source: Ambulatory Visit | Attending: Gastroenterology | Admitting: Gastroenterology

## 2014-06-11 DIAGNOSIS — K219 Gastro-esophageal reflux disease without esophagitis: Secondary | ICD-10-CM | POA: Diagnosis not present

## 2014-06-11 DIAGNOSIS — M1389 Other specified arthritis, multiple sites: Secondary | ICD-10-CM | POA: Insufficient documentation

## 2014-06-11 DIAGNOSIS — K222 Esophageal obstruction: Secondary | ICD-10-CM | POA: Diagnosis not present

## 2014-06-11 DIAGNOSIS — E119 Type 2 diabetes mellitus without complications: Secondary | ICD-10-CM | POA: Insufficient documentation

## 2014-06-11 DIAGNOSIS — Z880 Allergy status to penicillin: Secondary | ICD-10-CM | POA: Diagnosis not present

## 2014-06-11 DIAGNOSIS — Z888 Allergy status to other drugs, medicaments and biological substances status: Secondary | ICD-10-CM | POA: Insufficient documentation

## 2014-06-11 DIAGNOSIS — R0600 Dyspnea, unspecified: Secondary | ICD-10-CM

## 2014-06-11 DIAGNOSIS — K5909 Other constipation: Secondary | ICD-10-CM | POA: Insufficient documentation

## 2014-06-11 DIAGNOSIS — Z87891 Personal history of nicotine dependence: Secondary | ICD-10-CM | POA: Insufficient documentation

## 2014-06-11 DIAGNOSIS — Z885 Allergy status to narcotic agent status: Secondary | ICD-10-CM | POA: Diagnosis not present

## 2014-06-11 DIAGNOSIS — Z91012 Allergy to eggs: Secondary | ICD-10-CM | POA: Diagnosis not present

## 2014-06-11 DIAGNOSIS — K3189 Other diseases of stomach and duodenum: Secondary | ICD-10-CM | POA: Insufficient documentation

## 2014-06-11 HISTORY — PX: MALONEY DILATION: SHX5535

## 2014-06-11 HISTORY — PX: BRAVO PH STUDY: SHX5421

## 2014-06-11 HISTORY — PX: ESOPHAGOGASTRODUODENOSCOPY (EGD) WITH PROPOFOL: SHX5813

## 2014-06-11 SURGERY — ESOPHAGOGASTRODUODENOSCOPY (EGD) WITH PROPOFOL
Anesthesia: Monitor Anesthesia Care

## 2014-06-11 MED ORDER — DIPHENHYDRAMINE HCL 50 MG/ML IJ SOLN
INTRAMUSCULAR | Status: AC
  Filled 2014-06-11: qty 1

## 2014-06-11 MED ORDER — MIDAZOLAM HCL 10 MG/2ML IJ SOLN
INTRAMUSCULAR | Status: DC | PRN
  Administered 2014-06-11 (×3): 1 mg via INTRAVENOUS
  Administered 2014-06-11: 2 mg via INTRAVENOUS

## 2014-06-11 MED ORDER — MIDAZOLAM HCL 10 MG/2ML IJ SOLN
INTRAMUSCULAR | Status: AC
  Filled 2014-06-11: qty 2

## 2014-06-11 MED ORDER — PROPOFOL 10 MG/ML IV BOLUS
INTRAVENOUS | Status: AC
  Filled 2014-06-11: qty 20

## 2014-06-11 MED ORDER — BUTAMBEN-TETRACAINE-BENZOCAINE 2-2-14 % EX AERO
INHALATION_SPRAY | CUTANEOUS | Status: DC | PRN
  Administered 2014-06-11: 2 via TOPICAL

## 2014-06-11 MED ORDER — FENTANYL CITRATE 0.05 MG/ML IJ SOLN
INTRAMUSCULAR | Status: AC
  Filled 2014-06-11: qty 2

## 2014-06-11 MED ORDER — FENTANYL CITRATE 0.05 MG/ML IJ SOLN
INTRAMUSCULAR | Status: DC | PRN
  Administered 2014-06-11 (×2): 12.5 ug via INTRAVENOUS
  Administered 2014-06-11 (×2): 25 ug via INTRAVENOUS

## 2014-06-11 SURGICAL SUPPLY — 14 items

## 2014-06-11 NOTE — H&P (View-Only) (Signed)
_                                                                                                                History of Present Illness:  Kim Mullins is a pleasant 80 year old Afro-American female with diabetes and history of GERD referred for evaluation of shortness of breath.  Over the past 5 months she has developed dyspnea on exertion and wheezing.  Concern for has reflux prompted therapy with protonix and Pepcid the patient has had no significant improvement.  She denies dysphagia or pyrosis.  CT scan from earlier this month demonstrated air and fluid in the esophagus raising the question of a distal esophageal obstruction.  Several months ago she developed left-sided abdominal pain.  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  Patient complains of mild, chronic constipation.   Past Medical History  Diagnosis Date  . Bowel obstruction   . Left tibial fracture 2007  . Asthma     Allergixc reaction to cats.  . Anemia     occassionally  . Arthritis     all over.  . Diabetes mellitus 2006    Diet and exercise controlled.  Marland Kitchen GERD (gastroesophageal reflux disease)     occ  . Osteoarthritis of right shoulder region 06/26/2013   Past Surgical History  Procedure Laterality Date  . Abdominal hysterectomy  1969  . Dilation and curettage of uterus  1968  . Pilonidal cyst excision  1959  . Corn removal  1999    Bilateral feet  . Bunionectomy  1984    Bilateral  . Tonsillectomy  1942  . Eye surgery  1983    Surgery to fix Retainal detachment, bilateral  . Carpal tunnell  2004    Bilateral  . Total hip arthroplasty  11/16/2011    Procedure: TOTAL HIP ARTHROPLASTY ANTERIOR APPROACH;  Surgeon: Hessie Dibble, MD;  Location: [facility];  Service: Orthopedics;  Laterality: Right;  DEPUY  . Total shoulder arthroplasty Right 06/26/2013    Procedure: TOTAL SHOULDER ARTHROPLASTY;  Surgeon: Johnny Bridge, MD;  Location: [facility];  Service: Orthopedics;  Laterality: Right;     family history includes Anuerysm in her daughter; Cancer in her father; Coronary artery disease in her mother; Dementia in her mother; Diabetes in her maternal grandfather. Current Outpatient Prescriptions  Medication Sig Dispense Refill  . albuterol (PROVENTIL HFA;VENTOLIN HFA) 108 (90 BASE) MCG/ACT inhaler Inhale 1-2 puffs into the lungs every 6 (six) hours as needed for wheezing or shortness of breath.  1 Inhaler  1  . aspirin EC 81 MG tablet Take 81 mg by mouth daily.      . famotidine (PEPCID) 20 MG tablet One at bedtime  30 tablet  2  . meloxicam (MOBIC) 15 MG tablet Take 15 mg by mouth daily.      . pantoprazole (PROTONIX) 40 MG tablet Take 1 tablet (40 mg total) by mouth daily. Take 30-60 min before first meal of the day  30 tablet  2  No current facility-administered medications for this visit.   Allergies as of 05/29/2014 - Review Complete 05/29/2014  Allergen Reaction Noted  . Hydrocodone Other (See Comments) 02/09/2014  . Percocet [oxycodone-acetaminophen] Other (See Comments) 02/09/2014  . Mercurial derivatives  05/29/2014  . Eggs or egg-derived products Hives and Rash 11/02/2011  . Penicillins Hives and Rash 11/02/2011  . Quinine derivatives Hives and Rash 11/02/2011    reports that she quit smoking about 35 years ago. Her smoking use included Cigarettes. She has a 45 pack-year smoking history. She has never used smokeless tobacco. She reports that she drinks about 4.2 ounces of alcohol per week. She reports that she does not use illicit drugs.   Review of Systems: Pertinent positive and negative review of systems were noted in the above HPI section. All other review of systems were otherwise negative.  Vital signs were reviewed in today's medical record Physical Exam: General: Well developed , well nourished, no acute distress Skin: anicteric Head: Normocephalic and atraumatic Eyes:  sclerae anicteric, EOMI Ears: Normal auditory acuity Mouth: No deformity or  lesions Neck: Supple, no masses or thyromegaly Lungs: Clear throughout to auscultation Heart: Regular rate and rhythm; no murmurs, rubs or bruits Abdomen: Soft, non tender and non distended. No masses, hepatosplenomegaly or hernias noted. Normal Bowel sounds.  There is no succussion splash Rectal:deferred Musculoskeletal: Symmetrical with no gross deformities  Skin: No lesions on visible extremities Pulses:  Normal pulses noted Extremities: No clubbing, cyanosis, edema or deformities noted Neurological: Alert oriented x 4, grossly nonfocal Cervical Nodes:  No significant cervical adenopathy Inguinal Nodes: No significant inguinal adenopathy Psychological:  Alert and cooperative. Normal mood and affect  See Assessment and Plan under Problem List

## 2014-06-11 NOTE — Op Note (Signed)
[facility] [place], [address]   ENDOSCOPY PROCEDURE REPORT  PATIENT: Kim Mullins, Kim Mullins  MR#: [id number] BIRTHDATE: Jun 14, 1930 , 83  yrs. old GENDER: female ENDOSCOPIST: Inda Castle, MD REFERRED BY:  Tanda Rockers, M.D. PROCEDURE DATE:  06/11/2014 PROCEDURE:  EGD w/ Bravo capsule placement and Maloney dilation of esophagus ASA CLASS:     Class II INDICATIONS:  Dyspnea and wheezing; CT scan showed air-fluid level distal esophagus MEDICATIONS: Versed 5 mg IV and Fentanyl 75 mcg IV TOPICAL ANESTHETIC: Cetacaine Spray  DESCRIPTION OF PROCEDURE: After the risks benefits and alternatives of the procedure were thoroughly explained, informed consent was obtained.  The Pentax Gastroscope [id number] endoscope was introduced through the mouth and advanced to the second portion of the duodenum , Without limitations.  The instrument was slowly withdrawn as the mucosa was fully examined.    ESOPHAGUS: There was a short benign appearing stricture at the gastroesophageal junction.  The stricture was traversable.  The stricture was dilated using a  (52Fr Maloney dilator.   Except for the findings listed the EGD was otherwise normal.  Retroflexed views revealed no abnormalities.     The scope was then withdrawn from the patient and the procedure completed.  A #52 Isabell Jarvis dilator was passed with mild resistance.  There was trace heme. The GE junction was located at 43 centimeters from the incisors.  A bravo pH monitor was placed at 37 cm from the incisors.  COMPLICATIONS: There were no immediate complications.  ENDOSCOPIC IMPRESSION: 1.   There was a short stricture at the gastroesophageal junction; The stricture was dilated using a 52Fr Maloney dilator 2.   EGD was otherwise normal 3.  status post probable pH probe placement  RECOMMENDATIONS: hold Protonix and Pepcid for 48 hours then resume office visit 3 weeks  REPEAT EXAM:  eSigned:   Inda Castle, MD 06/11/2014 3:20 PM    CC:

## 2014-06-11 NOTE — Interval H&P Note (Signed)
History and Physical Interval Note:  06/11/2014 2:52 PM  Kim Mullins  has presented today for surgery, with the diagnosis of Cough [R05]  The various methods of treatment have been discussed with the patient and family. After consideration of risks, benefits and other options for treatment, the patient has consented to  Procedure(s): ESOPHAGOGASTRODUODENOSCOPY (EGD) WITH PROPOFOL (N/A) BRAVO PH STUDY (N/A) as a surgical intervention .  The patient's history has been reviewed, patient examined, no change in status, stable for surgery.  I have reviewed the patient's chart and labs.  Questions were answered to the patient's satisfaction.    The recent H&P (dated **05/29/14*) was reviewed, the patient was examined and there is no change in the patients condition since that H&P was completed.   Erskine Emery  06/11/2014, 2:52 PM    Erskine Emery

## 2014-06-12 ENCOUNTER — Encounter (HOSPITAL_COMMUNITY): Payer: Self-pay | Admitting: Gastroenterology

## 2014-06-13 ENCOUNTER — Telehealth: Payer: Self-pay | Admitting: Gastroenterology

## 2014-06-13 NOTE — Telephone Encounter (Signed)
Patient advised to go to the endo department.

## 2014-06-17 ENCOUNTER — Encounter: Payer: Self-pay | Admitting: Internal Medicine

## 2014-06-17 ENCOUNTER — Ambulatory Visit (INDEPENDENT_AMBULATORY_CARE_PROVIDER_SITE_OTHER): Payer: Medicare Other | Admitting: Internal Medicine

## 2014-06-17 VITALS — BP 130/98 | HR 81 | Temp 97.8°F | Ht 62.0 in | Wt 182.0 lb

## 2014-06-17 DIAGNOSIS — R0600 Dyspnea, unspecified: Secondary | ICD-10-CM

## 2014-06-17 MED ORDER — PANTOPRAZOLE SODIUM 40 MG PO TBEC
40.0000 mg | DELAYED_RELEASE_TABLET | Freq: Every day | ORAL | Status: DC
Start: 2014-06-17 — End: 2014-07-10

## 2014-06-17 MED ORDER — FAMOTIDINE 20 MG PO TABS
ORAL_TABLET | ORAL | Status: DC
Start: 2014-06-17 — End: 2014-07-10

## 2014-06-17 NOTE — Patient Instructions (Addendum)
Pantoprazole (protonix) 40 mg   Take 30-60 min before first meal of the day and Pepcid 20 mg one bedtime until return to office - this is the best way to tell whether stomach acid is contributing to your problem.    GERD (REFLUX)  is an extremely common cause of respiratory symptoms, many times with no significant heartburn at all.    It can be treated with medication, but also with lifestyle changes including avoidance of late meals, excessive alcohol, smoking cessation, and avoid fatty foods, chocolate, peppermint, colas, red wine, and acidic juices such as orange juice.  NO MINT OR MENTHOL PRODUCTS SO NO COUGH DROPS  USE SUGARLESS CANDY INSTEAD (jolley ranchers or Stover's)  NO OIL BASED VITAMINS - use powdered substitutes.    Please schedule a follow up office visit in 6 weeks, call sooner if needed with full pfts on return Late add: since ph probe neg will move up f/u to 2 weeks with full pfts in meantime and consider ent w/u or MBS

## 2014-06-17 NOTE — Progress Notes (Signed)
Subjective:    Patient ID: Kim Mullins, female    DOB: 27-Sep-1929  MRN: [id number]     Brief patient profile:  79 yobf quit smoking 1979 with hx seasonal   rhinitis but not cough /  Did wheeze and cough with cat exposure remotely but otherwise no asthma, and did fine until Jan 26 2014 with abrupt onset of cough/ wheeze/ sob  > self referred to pulmonary clinic 04/29/2014    History of Present Illness  04/29/2014 1st [facility] office visit/ Zali Kamaka  Chief Complaint  Patient presents with  . Pulmonary Consult    Self referral. Pt c/o wheezing and SOB for the past 3 months. She states that she gets SOB with minimal exertion such as walking from room to room.  She also c/o non prod cough- esp in the am. She is taking albuterol approx 3 times per day.  worse sob symptoms when get up in am but they don't typically wake her. Unclear why but much better x 2 days prior to OV   and did not use saba on day of ov- sob assoc with dry coughing fits - alb does seem to help but technique is poor (see a/p) rec Only use your albuterol (ventolin)  As rescue Pantoprazole (protonix) 40 mg   Take 30-60 min before first meal of the day and Pepcid 20 mg one bedtime until return to office - this is the best way to tell whether stomach acid is contributing to your problem.   GERD  Diet   Late add: due to pos d dimer> venous doppers done 9/4 neg bilaterally / CTa  05/01/2014 >  No evidence of pulmonary embolism.  Mild scattered atherosclerotic calcification aorta and coronary  arteries.  Distention of the esophagus by fluid and air, could be related to  reflux or less likely distal esophageal obstruction, recommend  correlation with patient symptoms.  > underwent EGD 06/11/14 for stricture, dilated  > underwent pH study off acid suppression> "no sign acid"   06/17/2014 f/u ov/Dawid Dupriest re: pseudoasthma/ nl spirometry  Chief Complaint  Patient presents with  . Follow-up    Pt states cough and SOB are  slightly better. She is using rescue inhaler 2-3 times per day. No new co's today.   egd pos stricture, rec continue ppi/ h2 hs but did not do it, then was told to stop them based on pH probe. No noct cough or sob   No obvious patterns in day to day or daytime variabilty or assoc excess or purulent mucus or  cp or chest tightness, subjective wheeze overt sinus or hb symptoms. No unusual exp hx or h/o childhood pna/ asthma or knowledge of premature birth.  Sleeping ok without nocturnal  or early am exacerbation  of respiratory  c/o's or need for noct saba. Also denies any obvious fluctuation of symptoms with weather or environmental changes or other aggravating or alleviating factors except as outlined above   Current Medications, Allergies, Complete Past Medical History, Past Surgical History, Family History, and Social History were reviewed in [employer] record.  ROS  The following are not active complaints unless bolded sore throat, dysphagia, dental problems, itching, sneezing,  nasal congestion or excess/ purulent secretions, ear ache,   fever, chills, sweats, unintended wt loss, pleuritic or exertional cp, hemoptysis,  orthopnea pnd or leg swelling, presyncope, palpitations, heartburn, abdominal pain, anorexia, nausea, vomiting, diarrhea  or change in bowel or urinary habits, change in stools or urine, dysuria,hematuria,  rash, arthralgias, visual complaints, headache, numbness weakness or ataxia or problems with walking or coordination,  change in mood/affect or memory.                      Objective:   Physical Exam  06/17/2014      182  Wt Readings from Last 3 Encounters:  04/29/14 177 lb (80.287 kg)  02/19/14 173 lb 4.8 oz (78.608 kg)  02/09/14 185 lb 4.8 oz (84.052 kg)     Delightful amb hoarse bf nad  HEENT: nl dentition, turbinates, and orophanx. Nl external ear canals without cough reflex   NECK :  without JVD/Nodes/TM/ nl carotid upstrokes  bilaterally   LUNGS: no acc muscle use, clear to A and P bilaterally without cough on insp or exp maneuvers   CV:  RRR  no s3 or murmur or increase in P2, no edema   ABD:  soft and nontender with nl excursion in the supine position. No bruits or organomegaly, bowel sounds nl  MS:  warm without deformities, calf tenderness, cyanosis or clubbing  SKIN: warm and dry without lesions    NEURO:  alert, approp, no deficits          Assessment & Plan:

## 2014-06-18 ENCOUNTER — Telehealth: Payer: Self-pay

## 2014-06-18 NOTE — Assessment & Plan Note (Addendum)
-   spirometry 04/29/2014  No obstruction  - 04/29/2014   Walked RA x one lap @ 185 stopped due to  Sob, no desat - CTa 04/30/2014 > Distention of the esophagus by fluid and air, could be related to reflux or less likely distal esophageal obstruction > referred to GI - 05/03/2014 neg venous dopplers bilaterally  - repeat spirometry 06/17/14 with active "wheeze" no obst  - 06/17/2014   Walked RA x one lap @ 185 stopped due to  Sob with no desat / nl pace   Symptoms are markedly disproportionate to objective findings and not clear this is a lung problem but pt does appear to have difficult airway management issues. DDX of  difficult airways management all start with A and  include Adherence, Ace Inhibitors, Acid Reflux, Active Sinus Disease, Alpha 1 Antitripsin deficiency, Anxiety masquerading as Airways dz,  ABPA,  allergy(esp in young), Aspiration (esp in elderly), Adverse effects of DPI,  Active smokers, plus two Bs  = Bronchiectasis and Beta blocker use..and one C= CHF  Adherence is always the initial "prime suspect" and is a multilayered concern that requires a "trust but verify" approach in every patient - starting with knowing how to use medications, especially inhalers, correctly, keeping up with refills and understanding the fundamental difference between maintenance and prns vs those medications only taken for a very short course and then stopped and not refilled.  -The proper method of use, as well as anticipated side effects, of a metered-dose inhaler are discussed and demonstrated to the patient. Improved effectiveness after extensive coaching during this visit to a level of approximately  75% from a baseline of < 25% so ok to continue prn saba though likelihood that any of this is asthma is low  ? Acid (or non-acid) GERD > always difficult to exclude as up to 75% of pts in some series report no assoc GI/ Heartburn symptoms> rec max (24h)  acid suppression and diet restrictions/ reviewed and  instructions given in writing.   ? Aspiration still a concern despite reasurrance that stricture is corrected.  May need to proceed with MBS next   ? chf Lab Results  Component Value Date   PROBNP 220.0* 04/29/2014   unlikely though has not been completely excluded    Each maintenance medication was reviewed in detail including most importantly the difference between maintenance and as needed and under what circumstances the prns are to be used.  Please see instructions for details which were reviewed in writing and the patient given a copy.

## 2014-06-18 NOTE — Telephone Encounter (Signed)
Message copied by Greggory Keen on Tue Jun 18, 2014  3:29 PM ------      Message from: Erskine Emery D      Created: Mon Jun 17, 2014  4:36 PM       Please inform the patient that the bravo pH test did not demonstrate significant acid reflux.  Accordingly, I do not think reflux is the etiology for her wheezing and dyspnea.  She should follow with Dr. Marlene Lard. ------

## 2014-06-18 NOTE — Procedures (Signed)
[facility]                               PROCEDURE NOTE  GRAINNE, SCHLARB                    MRN:          [id number] DATE:06/17/2014                            DOB:          [birth year]   A 48-hour BRAVO pH study was performed with the patient withholding acid suppression medicine. On day 1, were 4 episodes of reflux.  The percentage of time below pH 4 was 3.1%.  Total DeMeester score was 11.9, with normal less than 14.72. On day 2, there were no episodes of reflux.  IMPRESSION:  Bravo pH study does not demonstrate any significant acid reflux.  Doubt acid reflux is a cause for the patient's dyspnea on exertion and wheezing.  To consider discontinuing acid suppression medication.    Sandy Salaam. Deatra Ina, MD,FACG    RDK/MedQ  DD: 06/17/2014  DT: 06/18/2014  Job #: [id number]  cc:   Christena Deem. Melvyn Novas, MD, FCCP

## 2014-06-18 NOTE — Telephone Encounter (Signed)
I have left message for the patient to call back 

## 2014-06-19 ENCOUNTER — Other Ambulatory Visit: Payer: Self-pay | Admitting: Internal Medicine

## 2014-06-19 DIAGNOSIS — R0600 Dyspnea, unspecified: Secondary | ICD-10-CM

## 2014-06-28 ENCOUNTER — Ambulatory Visit (INDEPENDENT_AMBULATORY_CARE_PROVIDER_SITE_OTHER): Payer: Self-pay | Admitting: Nurse Practitioner

## 2014-06-28 ENCOUNTER — Encounter: Payer: Self-pay | Admitting: Nurse Practitioner

## 2014-06-28 VITALS — BP 110/70 | HR 74 | Ht 62.0 in | Wt 177.8 lb

## 2014-06-28 DIAGNOSIS — K222 Esophageal obstruction: Secondary | ICD-10-CM

## 2014-06-28 NOTE — Progress Notes (Signed)
     History of Present Illness:   Patient is an 80 year old female recently evaluated by [place] for dyspnea/wheezing. Patient does have a history of GERD. She underwent EGD with pH study Franki Monte which did not demonstrate any significant acid reflux, patient was advised to follow-up with pulmonary. Unfortunately today's appointment was made prior to the results of pH study. We tried to contact patient to cancel her appointment but were unable to reach her. I spoke briefly with the patient and explained that her workup was unremarkable. According to an addendum made to last pulmonary no, patient's followup care would be moved to a closer date and she would need PFTs. Patient did have a GE junction stricture seen on EGD. She underwent a Maloney dilation. She has some mild, occasional swallowing problems. I did advise her to call us back if swallowing problems did not improve or certainly if dysphagia worsened as repeat EGD with dilation may be needed  Assessment and Recommendations:  70. 80 year old female who we recently evaluated for wheezing/dyspnea. She had an esophageal stricture on EGD, status post Maloney dilation. A pH/bravo study was done and negative for any significant reflux. We advised pulmonary follow up and attempted to reach patient to cancel today's routine follow up. Unfortunately were unsuccessful in reaching the patient. Her co-pay was refunded. She understands that the pulmonary office will contact her regarding next appointment and PFTs.  2. Esophageal stricture, status post Maloney dilation. Patient has some occasional mild solid food dysphagia. She will contact us if dysphagia persists, or certainly if it worsens. Repeat EGD with dilation may become necessary at some point

## 2014-07-02 NOTE — Progress Notes (Signed)
Reviewed and agree with management. Levora Werden D. Duaa Stelzner, M.D., FACG  

## 2014-07-04 ENCOUNTER — Encounter: Payer: Self-pay | Admitting: Internal Medicine

## 2014-07-10 ENCOUNTER — Ambulatory Visit (INDEPENDENT_AMBULATORY_CARE_PROVIDER_SITE_OTHER): Payer: Medicare Other | Admitting: Internal Medicine

## 2014-07-10 ENCOUNTER — Ambulatory Visit (INDEPENDENT_AMBULATORY_CARE_PROVIDER_SITE_OTHER)
Admission: RE | Admit: 2014-07-10 | Discharge: 2014-07-10 | Disposition: A | Discharge status: Home / Self Care | Payer: Medicare Other | Source: Ambulatory Visit | Attending: Internal Medicine | Admitting: Internal Medicine

## 2014-07-10 ENCOUNTER — Encounter: Payer: Self-pay | Admitting: Internal Medicine

## 2014-07-10 ENCOUNTER — Other Ambulatory Visit (INDEPENDENT_AMBULATORY_CARE_PROVIDER_SITE_OTHER): Payer: Medicare Other

## 2014-07-10 VITALS — BP 124/88 | HR 86 | Ht 62.0 in | Wt 172.0 lb

## 2014-07-10 DIAGNOSIS — R0600 Dyspnea, unspecified: Secondary | ICD-10-CM

## 2014-07-10 DIAGNOSIS — R059 Cough, unspecified: Secondary | ICD-10-CM

## 2014-07-10 DIAGNOSIS — N289 Disorder of kidney and ureter, unspecified: Secondary | ICD-10-CM

## 2014-07-10 DIAGNOSIS — R05 Cough: Secondary | ICD-10-CM

## 2014-07-10 LAB — URINALYSIS, ROUTINE W REFLEX MICROSCOPIC
BILIRUBIN URINE: NEGATIVE
Hgb urine dipstick: NEGATIVE
KETONES UR: NEGATIVE
NITRITE: NEGATIVE
PH: 6 (ref 5.0–8.0)
Specific Gravity, Urine: 1.015 (ref 1.000–1.030)
TOTAL PROTEIN, URINE-UPE24: NEGATIVE
Urine Glucose: NEGATIVE
Urobilinogen, UA: 0.2 (ref 0.0–1.0)

## 2014-07-10 LAB — PULMONARY FUNCTION TEST
DL/VA % PRED: 108 %
DL/VA: 4.91 ml/min/mmHg/L
DLCO unc % pred: 58 %
DLCO unc: 12.51 ml/min/mmHg
FEF 25-75 PRE: 1.32 L/s
FEF 25-75 Post: 1.37 L/sec
FEF2575-%Change-Post: 3 %
FEF2575-%PRED-POST: 124 %
FEF2575-%Pred-Pre: 120 %
FEV1-%CHANGE-POST: 0 %
FEV1-%PRED-POST: 119 %
FEV1-%PRED-PRE: 119 %
FEV1-POST: 1.59 L
FEV1-Pre: 1.59 L
FEV1FVC-%CHANGE-POST: 6 %
FEV1FVC-%PRED-PRE: 104 %
FEV6-%Change-Post: -5 %
FEV6-%PRED-POST: 115 %
FEV6-%Pred-Pre: 122 %
FEV6-Post: 1.9 L
FEV6-Pre: 2 L
FEV6FVC-%Change-Post: 0 %
FEV6FVC-%PRED-PRE: 104 %
FEV6FVC-%Pred-Post: 105 %
FVC-%Change-Post: -6 %
FVC-%Pred-Post: 109 %
FVC-%Pred-Pre: 116 %
FVC-POST: 1.9 L
FVC-Pre: 2.02 L
Post FEV1/FVC ratio: 84 %
Post FEV6/FVC ratio: 100 %
Pre FEV1/FVC ratio: 78 %
Pre FEV6/FVC Ratio: 99 %
RV % pred: 70 %
RV: 1.63 L
TLC % pred: 74 %
TLC: 3.53 L

## 2014-07-10 LAB — BRAIN NATRIURETIC PEPTIDE: PRO B NATRI PEPTIDE: 346 pg/mL — AB (ref 0.0–100.0)

## 2014-07-10 LAB — CBC WITH DIFFERENTIAL/PLATELET
Basophils Absolute: 0.1 10*3/uL (ref 0.0–0.1)
Basophils Relative: 1 % (ref 0.0–3.0)
EOS PCT: 2.9 % (ref 0.0–5.0)
Eosinophils Absolute: 0.2 10*3/uL (ref 0.0–0.7)
HEMATOCRIT: 36 % (ref 36.0–46.0)
HEMOGLOBIN: 11.2 g/dL — AB (ref 12.0–15.0)
LYMPHS ABS: 1.8 10*3/uL (ref 0.7–4.0)
Lymphocytes Relative: 33.3 % (ref 12.0–46.0)
MCHC: 31.2 g/dL (ref 30.0–36.0)
MCV: 87 fl (ref 78.0–100.0)
MONOS PCT: 7.5 % (ref 3.0–12.0)
Monocytes Absolute: 0.4 10*3/uL (ref 0.1–1.0)
NEUTROS ABS: 3 10*3/uL (ref 1.4–7.7)
Neutrophils Relative %: 55.3 % (ref 43.0–77.0)
Platelets: 276 10*3/uL (ref 150.0–400.0)
RBC: 4.14 Mil/uL (ref 3.87–5.11)
RDW: 15.3 % (ref 11.5–15.5)
WBC: 5.4 10*3/uL (ref 4.0–10.5)

## 2014-07-10 LAB — BASIC METABOLIC PANEL
BUN: 27 mg/dL — ABNORMAL HIGH (ref 6–23)
CHLORIDE: 104 meq/L (ref 96–112)
CO2: 28 mEq/L (ref 19–32)
Calcium: 9 mg/dL (ref 8.4–10.5)
Creatinine, Ser: 1.3 mg/dL — ABNORMAL HIGH (ref 0.4–1.2)
GFR: 50.44 mL/min — ABNORMAL LOW (ref >60.00)
Glucose, Bld: 105 mg/dL — ABNORMAL HIGH (ref 70–99)
Potassium: 3.9 mEq/L (ref 3.5–5.1)
Sodium: 138 mEq/L (ref 135–145)

## 2014-07-10 NOTE — Patient Instructions (Addendum)
take chlortrimeton (chlorpheniramine) 4 mg x 2 at bedtime with pepcid 20 mg at bedtime also to see if helps your am cough  Please see patient coordinator before you leave today  to schedule echo   Please remember to go to the lab and xray   department downstairs for your tests - we will call you with the results when they are available.  Remember to follow the diet  avoidance of late meals, excessive alcohol, smoking cessation, and avoid fatty foods, chocolate, peppermint, colas, red wine, and acidic juices such as orange juice.  NO MINT OR MENTHOL PRODUCTS SO NO COUGH DROPS  USE SUGARLESS CANDY INSTEAD (Jolley ranchers or Stover's or Life Savers) or even ice chips will also do - the key is to swallow to prevent all throat clearing. NO OIL BASED VITAMINS - use powdered substitutes.

## 2014-07-10 NOTE — Progress Notes (Signed)
Subjective:    Patient ID: Kim Mullins, female    DOB: [birth year]  MRN: [id number]     Brief patient profile:  17 yobf quit smoking 1979 with hx seasonal   rhinitis but not cough /  Did wheeze and cough with cat exposure remotely but otherwise no asthma, and did fine until Jan 26 2014 with abrupt onset of cough/ wheeze/ sob  > self referred to pulmonary clinic 04/29/2014    History of Present Illness  04/29/2014 1st [facility] office visit/ Kim Mullins  Chief Complaint  Patient presents with  . Pulmonary Consult    Self referral. Pt c/o wheezing and SOB for the past 3 months. She states that she gets SOB with minimal exertion such as walking from room to room.  She also c/o non prod cough- esp in the am. She is taking albuterol approx 3 times per day.  worse sob symptoms when get up in am but they don't typically wake her. Unclear why but much better x 2 days prior to OV   and did not use saba on day of ov- sob assoc with dry coughing fits - alb does seem to help but technique is poor (see a/p) rec Only use your albuterol (ventolin)  As rescue Pantoprazole (protonix) 40 mg   Take 30-60 min before first meal of the day and Pepcid 20 mg one bedtime until return to office - this is the best way to tell whether stomach acid is contributing to your problem.   GERD  Diet   Late add: due to pos d dimer> venous doppers done 9/4 neg bilaterally / CTa  05/01/2014 >  No evidence of pulmonary embolism.  Mild scattered atherosclerotic calcification aorta and coronary  arteries.  Distention of the esophagus by fluid and air, could be related to  reflux or less likely distal esophageal obstruction, recommend  correlation with patient symptoms.  > underwent EGD 06/11/14 for stricture, dilated  > underwent pH study off acid suppression> "no sign acid"   06/17/2014 f/u ov/Kim Mullins re: pseudoasthma/ nl spirometry  Chief Complaint  Patient presents with  . Follow-up    Pt states cough and SOB are  slightly better. She is using rescue inhaler 2-3 times per day. No new co's today.   egd pos stricture, rec continue ppi/ h2 hs but did not do it, then was told to stop them based on pH probe. No noct cough or sob  rec Pantoprazole (protonix) 40 mg   Take 30-60 min before first meal of the day and Pepcid 20 mg one bedtime until return to office - this is the best way to tell whether stomach acid is contributing to your problem.   GERD  Diet      07/10/2014 f/u ov/Kim Mullins re: unexplained sob/ did not take any gerd rx at all  Chief Complaint  Patient presents with  . Follow-up    PFT done today. Pt states that her breathing is unchanged. No new co's today.    every day since June 2015 wake up coughing / wheezing / not using inhalers now and made no difference when she did  No obvious day to day or daytime variabilty or assoc chronic cough or cp or chest tightness, subjective wheeze overt sinus or hb symptoms. No unusual exp hx or h/o childhood pna/ asthma or knowledge of premature birth.  Sleeping ok without nocturnal  or early am exacerbation  of respiratory  c/o's or need for noct saba. Also denies any  obvious fluctuation of symptoms with weather or environmental changes or other aggravating or alleviating factors except as outlined above   Current Medications, Allergies, Complete Past Medical History, Past Surgical History, Family History, and Social History were reviewed in [employer] record.  ROS  The following are not active complaints unless bolded sore throat, dysphagia, dental problems, itching, sneezing,  nasal congestion or excess/ purulent secretions, ear ache,   fever, chills, sweats, unintended wt loss, pleuritic or exertional cp, hemoptysis,  orthopnea pnd or leg swelling, presyncope, palpitations, heartburn, abdominal pain, anorexia, nausea, vomiting, diarrhea  or change in bowel or urinary habits, change in stools or urine, dysuria,hematuria,  rash,  arthralgias, visual complaints, headache, numbness weakness or ataxia or problems with walking or coordination,  change in mood/affect or memory.                                    Objective:   Physical Exam  06/17/2014      182 > 07/10/2014  172  Wt Readings from Last 3 Encounters:  04/29/14 177 lb (80.287 kg)  02/19/14 173 lb 4.8 oz (78.608 kg)  02/09/14 185 lb 4.8 oz (84.052 kg)     Delightful amb min  hoarse bf nad  HEENT: nl dentition, turbinates, and orophanx. Nl external ear canals without cough reflex   NECK :  without JVD/Nodes/TM/ nl carotid upstrokes bilaterally   LUNGS: no acc muscle use, clear to A and P bilaterally without cough on insp or exp maneuvers   CV:  RRR  no s3 or murmur or increase in P2, no edema   ABD:  soft and nontender with nl excursion in the supine position. No bruits or organomegaly, bowel sounds nl  MS:  warm without deformities, calf tenderness, cyanosis or clubbing  SKIN: warm and dry without lesions          CTa 05/01/14 No evidence of pulmonary embolism. Mild scattered atherosclerotic calcification aorta and coronary arteries. Distention of the esophagus by fluid and air, could be related to reflux or less likely distal esophageal obstruction, recommend correlation with patient symptoms.    cxr 07/10/14 1. Mild cardiomegaly, without edema. 2. Atherosclerosis. 3. No acute findings.  Recent Labs Lab 07/10/14 1517  NA 138  K 3.9  CL 104  CO2 28  BUN 27*  CREATININE 1.3*  GLUCOSE 105*    Recent Labs Lab 07/10/14 1517  HGB 11.2*  HCT 36.0  WBC 5.4  PLT 276.0     Lab Results  Component Value Date   TSH 1.46 04/29/2014     Lab Results  Component Value Date   PROBNP 346.0* 07/10/2014                 Assessment & Plan:

## 2014-07-10 NOTE — Progress Notes (Signed)
PFT done today. 

## 2014-07-11 NOTE — Progress Notes (Signed)
Quick Note:  LMTCB ______ 

## 2014-07-12 ENCOUNTER — Ambulatory Visit: Payer: Medicare Other | Admitting: Internal Medicine

## 2014-07-12 ENCOUNTER — Telehealth: Payer: Self-pay | Admitting: Internal Medicine

## 2014-07-12 NOTE — Telephone Encounter (Signed)
LMTCB

## 2014-07-13 DIAGNOSIS — R05 Cough: Secondary | ICD-10-CM | POA: Insufficient documentation

## 2014-07-13 DIAGNOSIS — R059 Cough, unspecified: Secondary | ICD-10-CM | POA: Insufficient documentation

## 2014-07-13 NOTE — Assessment & Plan Note (Signed)
Most likely this is  Classic Upper airway cough syndrome, so named because it's frequently impossible to sort out how much is  CR/sinusitis with freq throat clearing (which can be related to primary GERD)   vs  causing  secondary (" extra esophageal")  GERD from wide swings in gastric pressure that occur with throat clearing, often  promoting self use of mint and menthol lozenges that reduce the lower esophageal sphincter tone and exacerbate the problem further in a cyclical fashion.   These are the same pts (now being labeled as having "irritable larynx syndrome" by some cough centers) who not infrequently have a history of having failed to tolerate ace inhibitors,  dry powder inhalers or biphosphonates or report having atypical reflux symptoms that don't respond to standard doses of PPI , and are easily confused as having aecopd or asthma flares by even experienced allergists/ pulmonologists.  Since most severe in am rec trial of max h1 h2 hs then regroup if still present

## 2014-07-13 NOTE — Assessment & Plan Note (Addendum)
-   spirometry 04/29/2014  No obstruction  - 04/29/2014   Walked RA x one lap @ 185 stopped due to  Sob, no desat - CTa 04/30/2014 > Distention of the esophagus by fluid and air, could be related to reflux or less likely distal esophageal obstruction > referred to GI - 05/03/2014 neg venous dopplers bilaterally  - repeat spirometry 06/17/14 with active "wheeze" no obst  - 06/17/2014   Walked RA x one lap @ 185 stopped due to  Sob with no desat / nl pace  - 07/10/2014  Walked RA x 3 laps @ 185 ft each stopped due to unsteady gait, nl pace, no sob/ no desat - PFTs 07/10/2014 wnl x dlco 58 corrects to 108    Continues to have Symptoms are markedly disproportionate to objective findings and not clear this is a lung problem but pt does appear to have difficult airway management issues. DDX of  difficult airways management all start with A and  include Adherence, Ace Inhibitors, Acid Reflux, Active Sinus Disease, Alpha 1 Antitripsin deficiency, Anxiety masquerading as Airways dz,  ABPA,  allergy(esp in young), Aspiration (esp in elderly), Adverse effects of DPI,  Active smokers, plus two Bs  = Bronchiectasis and Beta blocker use..and one C= CHF  Adherence is always the initial "prime suspect" and is a multilayered concern that requires a "trust but verify" approach in every patient - starting with knowing how to use medications, especially inhalers, correctly, keeping up with refills and understanding the fundamental difference between maintenance and prns vs those medications only taken for a very short course and then stopped and not refilled.  - did not follow previous instructions though it appears that acid reflux is not her problem  ? CHF > have not excluded > needs echo next

## 2014-07-15 NOTE — Telephone Encounter (Signed)
Magda Paganini was calling to inform pt of cxr and lab results. Spoke with pt.  Reports she was informed of above Friday evening and nothing further is needed.

## 2014-07-17 ENCOUNTER — Ambulatory Visit (HOSPITAL_COMMUNITY): Payer: Medicare Other | Attending: Internal Medicine | Admitting: Cardiology

## 2014-07-17 DIAGNOSIS — E669 Obesity, unspecified: Secondary | ICD-10-CM | POA: Diagnosis not present

## 2014-07-17 DIAGNOSIS — R0600 Dyspnea, unspecified: Secondary | ICD-10-CM | POA: Diagnosis not present

## 2014-07-17 DIAGNOSIS — Z87891 Personal history of nicotine dependence: Secondary | ICD-10-CM | POA: Diagnosis not present

## 2014-07-17 NOTE — Progress Notes (Signed)
Echo performed. 

## 2014-07-18 ENCOUNTER — Other Ambulatory Visit: Payer: Self-pay | Admitting: Internal Medicine

## 2014-07-18 DIAGNOSIS — R0600 Dyspnea, unspecified: Secondary | ICD-10-CM

## 2014-07-18 NOTE — Progress Notes (Signed)
Quick Note:  Spoke with pt and notified of results per Dr. Wert. Pt verbalized understanding and denied any questions.  ______ 

## 2014-07-19 ENCOUNTER — Ambulatory Visit: Payer: Medicare Other | Admitting: Cardiovascular Disease

## 2014-07-23 ENCOUNTER — Encounter: Payer: Self-pay | Admitting: Internal Medicine

## 2014-07-29 NOTE — Telephone Encounter (Signed)
Please advise. Thanks.  

## 2014-07-30 ENCOUNTER — Ambulatory Visit: Payer: Medicare Other | Admitting: Internal Medicine

## 2014-08-02 ENCOUNTER — Ambulatory Visit: Payer: Medicare Other | Admitting: Cardiovascular Disease

## 2014-08-03 ENCOUNTER — Encounter: Payer: Self-pay | Admitting: Internal Medicine

## 2014-08-16 ENCOUNTER — Encounter: Payer: Self-pay | Admitting: Cardiology

## 2014-08-16 ENCOUNTER — Ambulatory Visit (INDEPENDENT_AMBULATORY_CARE_PROVIDER_SITE_OTHER): Payer: Medicare Other | Admitting: Cardiology

## 2014-08-16 VITALS — BP 128/80 | HR 77 | Ht 62.0 in | Wt 177.8 lb

## 2014-08-16 DIAGNOSIS — I272 Pulmonary hypertension, unspecified: Secondary | ICD-10-CM | POA: Insufficient documentation

## 2014-08-16 DIAGNOSIS — R0602 Shortness of breath: Secondary | ICD-10-CM

## 2014-08-16 DIAGNOSIS — I270 Primary pulmonary hypertension: Secondary | ICD-10-CM

## 2014-08-16 DIAGNOSIS — I420 Dilated cardiomyopathy: Secondary | ICD-10-CM

## 2014-08-16 HISTORY — DX: Dilated cardiomyopathy: I42.0

## 2014-08-16 LAB — BASIC METABOLIC PANEL
BUN: 20 mg/dL (ref 6–23)
CHLORIDE: 104 meq/L (ref 96–112)
CO2: 24 meq/L (ref 19–32)
Calcium: 8.9 mg/dL (ref 8.4–10.5)
Creatinine, Ser: 1 mg/dL (ref 0.4–1.2)
GFR: 69.06 mL/min (ref >60.00)
Glucose, Bld: 82 mg/dL (ref 70–99)
Potassium: 4.8 mEq/L (ref 3.5–5.1)
Sodium: 138 mEq/L (ref 135–145)

## 2014-08-16 MED ORDER — LISINOPRIL 5 MG PO TABS: 5.0000 mg | ORAL_TABLET | Freq: Every day | ORAL | Status: DC

## 2014-08-16 NOTE — Patient Instructions (Signed)
Your physician has recommended you make the following change in your medication:  1) START Lisinopril 5 mg daily   Your physician has requested that you have a lexiscan myoview. For further information please visit [email or web address]. Please follow instruction sheet, as given.  Your physician recommends that you have lab work TODAY (BMET, BNP).  Your physician has recommended that you have a sleep study. This test records several body functions during sleep, including: brain activity, eye movement, oxygen and carbon dioxide blood levels, heart rate and rhythm, breathing rate and rhythm, the flow of air through your mouth and nose, snoring, body muscle movements, and chest and belly movement.  Your physician recommends that you schedule a follow-up appointment in: 2 weeks with an APP at our office.  You will have repeat lab work drawn on the same day.  Your physician recommends that you schedule a follow-up appointment in: 41 WEEKS with Dr. Radford Pax.

## 2014-08-16 NOTE — Progress Notes (Signed)
[facility], [place], [place]  [address] Phone: [phone] Fax:  [phone]  Date:  08/16/2014   ID:  Kim Mullins, DOB 25-Jan-1930, MRN [id number]  PCP:  Kristine Garbe, MD  Cardiologist:  Fransico Him, MD    History of Present Illness: Kim Mullins is a 80 y.o. female with a history of asthma, anemia, GERD with esophageal stricture s/p dilatation, DM diet controlled who presents today for evaluation of SOB.  She has been seen by Dr. Melvyn Novas for wheezing and DOE.  2D echo was owas ordered by Dr. Melvyn Novas which showed mildly reduced LVF with EF 40-45% with severe hypokinesis of the inferior wall, moderately leaky MV, moderate PR and moderate pulmonary HTN.  She is now referred for cardiac eval.  She says that DOE has been present since May.  She denies any PND or orthopnea.  She denies any LE edema, chest pain or pressure.  She denies any palpitations, dizziness or syncope.  She says that she snores at night and gets sleepy during the day and usually has to take a nap.   Wt Readings from Last 3 Encounters:  08/16/14 177 lb 12.8 oz (80.65 kg)  07/10/14 172 lb (78.019 kg)  06/28/14 177 lb 12.8 oz (80.65 kg)     Past Medical History  Diagnosis Date  . Bowel obstruction   . Left tibial fracture 2007  . Asthma     Allergixc reaction to cats.  . Anemia     occassionally  . Arthritis     all over.  . Diabetes mellitus 2006    Diet and exercise controlled.  Marland Kitchen GERD (gastroesophageal reflux disease)     occ  . Osteoarthritis of right shoulder region 06/26/2013  . Shortness of breath     with exertion- started in MAY 2015, "dry cough,wheezing"    Current Outpatient Prescriptions  Medication Sig Dispense Refill  . aspirin EC 81 MG tablet Take 81 mg by mouth daily.    . bisacodyl (DULCOLAX) 5 MG EC tablet Take 5 mg by mouth daily as needed for mild constipation or moderate constipation.    . meloxicam (MOBIC) 15 MG tablet Take 15 mg by mouth daily.    . polyethylene  glycol (MIRALAX / GLYCOLAX) packet Take 17 g by mouth daily.     No current facility-administered medications for this visit.    Allergies:    Allergies  Allergen Reactions  . Hydrocodone Other (See Comments)    Too away all energy and made her stop eating food for short time  . Percocet [Oxycodone-Acetaminophen] Other (See Comments)    Too away all energy and made her stop eating food for short time  . Mercurial Derivatives   . Eggs Or Egg-Derived Products Hives and Rash  . Penicillins Hives and Rash  . Quinine Derivatives Hives and Rash    Social History:  The patient  reports that she quit smoking about 36 years ago. Her smoking use included Cigarettes. She has a 45 pack-year smoking history. She has never used smokeless tobacco. She reports that she drinks about 4.2 oz of alcohol per week. She reports that she does not use illicit drugs.   Family History:  The patient's family history includes Anuerysm in her daughter; Cancer in her father; Coronary artery disease in her mother; Dementia in her mother; Diabetes in her maternal grandfather. There is no history of Colon cancer.   ROS:  Please see the history of present illness.  All other systems reviewed and negative.   PHYSICAL EXAM: VS:  BP 128/80 mmHg  Ht 5\' 2"  (1.575 m)  Wt 177 lb 12.8 oz (80.65 kg)  BMI 32.51 kg/m2 Well nourished, well developed, in no acute distress HEENT: normal Neck: no JVD Cardiac:  normal S1, S2; RRR; no murmur Lungs:  , few wheezes Abd: soft, nontender, no hepatomegaly Ext: no edema Skin: warm and dry Neuro:  CNs 2-12 intact, no focal abnormalities noted  EKG:     NSR, LAFB and anterior infarct, PVC  ASSESSMENT AND PLAN:  1. DOE with 2D echo showing mildly reduced LVF with inferior wall motion abnormality raising the concern for possible underlying CAD as the etiology of her DOE.  She has a history of GERD and asthma as well.  BNP in November was mildly elevated.  Will get Lexiscan myoview  to rule out ischemia.  I am going to check another BNP today.  She is not on any diuretics.  Continue ASA.  Will hold off on BB due to asthma and mild wheezing on exam.  Will start Lisinopril 5mg  daily for afterload reduction for her DCM and have her see the PA in 2 weeks .  Check BMET in 2 weeks  2. HTN - well controlled 3. Mild LV dysfunction EF 40-45% with inferior wall motion abnormality and anterior infarct on EKG.  Add low dose ACE I but no BB due to history of Asthma and wheezing.  Will add diuretic if BNP is elevated. 4. Moderate pulmonary HTN of unclear etiology - ? Secondary to systolic CHF/MR/asthma/?OSA.  I will get a sleep study to rule out OSA.   Followup with PA in 2 weeks and me in 6 weeks  Signed, Fransico Him, MD [facility] 08/16/2014 11:28 AM

## 2014-08-19 ENCOUNTER — Other Ambulatory Visit: Payer: Medicare Other

## 2014-08-19 DIAGNOSIS — R7989 Other specified abnormal findings of blood chemistry: Secondary | ICD-10-CM

## 2014-08-20 LAB — BRAIN NATRIURETIC PEPTIDE: Brain Natriuretic Peptide: 309 pg/mL — ABNORMAL HIGH (ref 0.0–100.0)

## 2014-08-21 ENCOUNTER — Ambulatory Visit (HOSPITAL_COMMUNITY): Payer: Medicare Other | Attending: Cardiovascular Disease | Admitting: Radiology

## 2014-08-21 DIAGNOSIS — R0602 Shortness of breath: Secondary | ICD-10-CM | POA: Insufficient documentation

## 2014-08-21 DIAGNOSIS — I10 Essential (primary) hypertension: Secondary | ICD-10-CM | POA: Diagnosis not present

## 2014-08-21 DIAGNOSIS — R9431 Abnormal electrocardiogram [ECG] [EKG]: Secondary | ICD-10-CM | POA: Diagnosis not present

## 2014-08-21 DIAGNOSIS — R0609 Other forms of dyspnea: Secondary | ICD-10-CM | POA: Diagnosis present

## 2014-08-21 MED ORDER — REGADENOSON 0.4 MG/5ML IV SOLN
0.4000 mg | Freq: Once | INTRAVENOUS | Status: AC
  Administered 2014-08-21: 0.4 mg via INTRAVENOUS

## 2014-08-21 MED ORDER — TECHNETIUM TC 99M SESTAMIBI GENERIC - CARDIOLITE
10.0000 | Freq: Once | INTRAVENOUS | Status: AC | PRN
  Administered 2014-08-21: 10 via INTRAVENOUS

## 2014-08-21 MED ORDER — TECHNETIUM TC 99M SESTAMIBI GENERIC - CARDIOLITE
30.0000 | Freq: Once | INTRAVENOUS | Status: AC | PRN
  Administered 2014-08-21: 30 via INTRAVENOUS

## 2014-08-21 NOTE — Progress Notes (Signed)
[facility] [address] [place], [place] [address] [phone]    Cardiology Nuclear Med Study  Jezell Sha is a 80 y.o. female     MRN : [id number]     DOB: 1930-07-23  Procedure Date: 08/21/2014  Nuclear Med Background Indication for Stress Test:  Evaluation for Ischemia and Abnormal EKG History:  Asthma and CAD Cardiac Risk Factors: Hypertension  Symptoms:  DOE and SOB   Nuclear Pre-Procedure Caffeine/Decaff Intake:  None NPO After: 8:00am   Lungs:  clear O2 Sat: 98% on room air. IV 0.9% NS with Angio Cath:  22g  IV Site: R Hand  IV Started by:  Matilde Haymaker, RN  Chest Size (in):  42 Cup Size: D  Height: 5\' 2"  (1.575 m)  Weight:  177 lb (80.287 kg)  BMI:  Body mass index is 32.37 kg/(m^2). Tech Comments:  n/a    Nuclear Med Study 1 or 2 day study: 1 day  Stress Test Type:  Lexiscan  Reading MD: n/a  Order Authorizing Provider:  Tressia Miners Turner,MD  Resting Radionuclide: Technetium 50m Sestamibi  Resting Radionuclide Dose: 11.0 mCi   Stress Radionuclide:  Technetium 51m Sestamibi  Stress Radionuclide Dose: 33.0 mCi           Stress Protocol Rest HR: 72 Stress HR: 81  Rest BP: 181/90 Stress BP: 165/102  Exercise Time (min): n/a METS: n/a   Predicted Max HR: 138 bpm % Max HR: 58.7 bpm Rate Pressure Product: 13365   Dose of Adenosine (mg):  n/a Dose of Lexiscan: 0.4 mg  Dose of Atropine (mg): n/a Dose of Dobutamine: n/a mcg/kg/min (at max HR)  Stress Test Technologist: Perrin Maltese, EMT-P  Nuclear Technologist:  Vedia Pereyra, CNMT     Rest Procedure:  Myocardial perfusion imaging was performed at rest 45 minutes following the intravenous administration of Technetium 5m Sestamibi. Rest ECG: NSR, LAD, anterior MI.  Stress Procedure:  The patient received IV Lexiscan 0.4 mg over 15-seconds.  Technetium 47m Sestamibi injected at 30-seconds. This patient had no symptoms with the Lexiscan injection. Quantitative spect  images were obtained after a 45 minute delay. Stress ECG: No significant ST segment change suggestive of ischemia.  QPS Raw Data Images:  Acquisition technically good; normal left ventricular size. Stress Images:  There is decreased uptake in the inferior wall. Rest Images:  Normal homogeneous uptake in all areas of the myocardium. Subtraction (SDS):  These findings are consistent with ischemia. Transient Ischemic Dilatation (Normal <1.22):  0.82 Lung/Heart Ratio (Normal <0.45):  0.30  Quantitative Gated Spect Images QGS EDV:  111 ml QGS ESV:  63 ml  Impression Exercise Capacity:  Lexiscan with no exercise. BP Response:  Normal blood pressure response. Clinical Symptoms:  No chest pain or dyspnea. ECG Impression:  No significant ST segment change suggestive of ischemia. Comparison with Prior Nuclear Study: No previous nuclear study performed  Overall Impression:  Intermediate risk stress nuclear study with a small, moderate intensity, reversible inferior defect consistent with mild inferior ischemia; study intermediate risk due to reduced LV function.  LV Ejection Fraction: 43%.  LV Wall Motion:  Global hypokinesis.  Kirk Ruths

## 2014-08-27 ENCOUNTER — Other Ambulatory Visit: Payer: Self-pay | Admitting: Cardiology

## 2014-08-27 ENCOUNTER — Ambulatory Visit (INDEPENDENT_AMBULATORY_CARE_PROVIDER_SITE_OTHER): Payer: Medicare Other | Admitting: Cardiology

## 2014-08-27 VITALS — BP 118/72 | HR 92 | Wt 180.2 lb

## 2014-08-27 DIAGNOSIS — Z01812 Encounter for preprocedural laboratory examination: Secondary | ICD-10-CM

## 2014-08-27 DIAGNOSIS — I420 Dilated cardiomyopathy: Secondary | ICD-10-CM

## 2014-08-27 DIAGNOSIS — R9439 Abnormal result of other cardiovascular function study: Secondary | ICD-10-CM

## 2014-08-27 DIAGNOSIS — R0602 Shortness of breath: Secondary | ICD-10-CM

## 2014-08-27 DIAGNOSIS — R0600 Dyspnea, unspecified: Secondary | ICD-10-CM

## 2014-08-27 LAB — CBC WITH DIFFERENTIAL/PLATELET
Basophils Absolute: 0.1 10*3/uL (ref 0.0–0.1)
Basophils Relative: 1.2 % (ref 0.0–3.0)
EOS PCT: 4.7 % (ref 0.0–5.0)
Eosinophils Absolute: 0.2 10*3/uL (ref 0.0–0.7)
HEMATOCRIT: 39.8 % (ref 36.0–46.0)
Hemoglobin: 12.2 g/dL (ref 12.0–15.0)
LYMPHS ABS: 1.4 10*3/uL (ref 0.7–4.0)
Lymphocytes Relative: 34.1 % (ref 12.0–46.0)
MCHC: 30.7 g/dL (ref 30.0–36.0)
MCV: 88.1 fl (ref 78.0–100.0)
Monocytes Absolute: 0.3 10*3/uL (ref 0.1–1.0)
Monocytes Relative: 7.8 % (ref 3.0–12.0)
NEUTROS PCT: 52.2 % (ref 43.0–77.0)
Neutro Abs: 2.2 10*3/uL (ref 1.4–7.7)
Platelets: 296 10*3/uL (ref 150.0–400.0)
RBC: 4.51 Mil/uL (ref 3.87–5.11)
RDW: 15.2 % (ref 11.5–15.5)
WBC: 4.2 10*3/uL (ref 4.0–10.5)

## 2014-08-27 LAB — BASIC METABOLIC PANEL
BUN: 19 mg/dL (ref 6–23)
CHLORIDE: 107 meq/L (ref 96–112)
CO2: 27 meq/L (ref 19–32)
CREATININE: 1.1 mg/dL (ref 0.4–1.2)
Calcium: 8.9 mg/dL (ref 8.4–10.5)
GFR: 59.28 mL/min — ABNORMAL LOW (ref >60.00)
Glucose, Bld: 143 mg/dL — ABNORMAL HIGH (ref 70–99)
Potassium: 4.4 mEq/L (ref 3.5–5.1)
Sodium: 141 mEq/L (ref 135–145)

## 2014-08-27 LAB — APTT: APTT: 27.5 s (ref 23.4–32.7)

## 2014-08-27 NOTE — Progress Notes (Signed)
[facility], [place], [place]  [address] Phone: [phone] Fax:  [phone]  Date:  08/27/2014   ID:  Kim Mullins, DOB 1930/06/25, MRN [id number]  PCP:  Kristine Garbe, MD  Cardiologist:  Fransico Him, MD    History of Present Illness: Kim Mullins is a 80 y.o. female with a history of asthma, anemia, GERD with esophageal stricture s/p dilatation, DM diet controlled who presents today for followup of SOB. She has been seen by Dr. Melvyn Novas for wheezing and DOE. 2D echo was owas ordered by Dr. Melvyn Novas which showed mildly reduced LVF with EF 40-45% with severe hypokinesis of the inferior wall, moderately leaky MV, moderate PR and moderate pulmonary HTN. She says that DOE has been present since May. She denies any PND or orthopnea. She denies any LE edema, chest pain or pressure. She denies any palpitations, dizziness or syncope. She underwent nuclear stress test that showed an ntermediate risk stress nuclear study with a small, moderate intensity, reversible inferior defect consistent with mild inferior ischemia; study intermediate risk due to reduced LV function.  She now presents today for followup.    Wt Readings from Last 3 Encounters:  08/27/14 180 lb 3.2 oz (81.738 kg)  08/21/14 177 lb (80.287 kg)  08/16/14 177 lb 12.8 oz (80.65 kg)     Past Medical History  Diagnosis Date  . Bowel obstruction   . Left tibial fracture 2007  . Asthma     Allergixc reaction to cats.  . Anemia     occassionally  . Arthritis     all over.  . Diabetes mellitus 2006    Diet and exercise controlled.  Marland Kitchen GERD (gastroesophageal reflux disease)     occ  . Osteoarthritis of right shoulder region 06/26/2013  . Shortness of breath     with exertion- started in MAY 2015, "dry cough,wheezing"  . DCM (dilated cardiomyopathy) 08/16/2014    Current Outpatient Prescriptions  Medication Sig Dispense Refill  . aspirin EC 81 MG tablet Take 81 mg by mouth daily.    . bisacodyl  (DULCOLAX) 5 MG EC tablet Take 5 mg by mouth daily as needed for mild constipation or moderate constipation.    Marland Kitchen lisinopril (PRINIVIL,ZESTRIL) 5 MG tablet Take 1 tablet (5 mg total) by mouth daily. 30 tablet 3  . meloxicam (MOBIC) 15 MG tablet Take 15 mg by mouth daily.    . polyethylene glycol (MIRALAX / GLYCOLAX) packet Take 17 g by mouth daily.     No current facility-administered medications for this visit.    Allergies:    Allergies  Allergen Reactions  . Hydrocodone Other (See Comments)    Too away all energy and made her stop eating food for short time  . Percocet [Oxycodone-Acetaminophen] Other (See Comments)    Too away all energy and made her stop eating food for short time  . Mercurial Derivatives   . Eggs Or Egg-Derived Products Hives and Rash  . Penicillins Hives and Rash  . Quinine Derivatives Hives and Rash    Social History:  The patient  reports that she quit smoking about 36 years ago. Her smoking use included Cigarettes. She has a 45 pack-year smoking history. She has never used smokeless tobacco. She reports that she drinks about 4.2 oz of alcohol per week. She reports that she does not use illicit drugs.   Family History:  The patient's family history includes Anuerysm in her daughter; Cancer in her father; Coronary artery disease in  her mother; Dementia in her mother; Diabetes in her maternal grandfather. There is no history of Colon cancer.   ROS:  Please see the history of present illness.      All other systems reviewed and negative.   PHYSICAL EXAM: VS:  BP 118/72 mmHg  Pulse 92  Wt 180 lb 3.2 oz (81.738 kg)  SpO2 99% Well nourished, well developed, in no acute distress HEENT: normal Neck: no JVD Cardiac:  normal S1, S2; RRR; no murmur Lungs:  clear to auscultation bilaterally, no wheezing, rhonchi or rales Abd: soft, nontender, no hepatomegaly Ext: no edema Skin: warm and dry Neuro:  CNs 2-12 intact, no focal abnormalities noted  ASSESSMENT AND  PLAN:  1.  DOE with 2D echo showing mildly reduced LVF with inferior wall motion abnormality raising the concern for possible underlying CAD as the etiology of her DOE. Recent stress myoview showed ischemia.  She has a history of GERD and asthma as well. BNP in November was mildly elevated.Continue ASA. Continue Lisinopril 5mg  daily for afterload reduction for her DCM.  BB not started due to history of wheezing.  I have reviewed the findings of the stress test with her and recommend that we proceed with heart catheterization to evaluate coronary anatomy.  Cardiac catheterization was discussed with the patient fully including risks on myocardial infarction, death, stroke, bleeding, arrhythmia, dye allergy, renal insufficiency or bleeding.  All patient questions and concerns were discussed and the patient understands and is willing to proceed.   2.  HTN - well controlled 3.  Mild LV dysfunction EF 40-45% with inferior wall motion abnormality and anterior infarct on EKG. Add low dose ACE I but no BB due to history of Asthma and wheezing.   4.  Moderate pulmonary HTN of unclear etiology - ? Secondary to systolic CHF/MR/asthma/?OSA.Sleep study has been ordered to rule out OSA.  Will plan right heart cath at time of cath. 5.  She has a history of GERD and asthma as well.     Signed, Fransico Him, MD [facility] 08/27/2014 8:20 AM

## 2014-08-27 NOTE — Patient Instructions (Addendum)
Your physician recommends that you have lab work TODAY.  Your physician has requested that you have a cardiac catheterization. Cardiac catheterization is used to diagnose and/or treat various heart conditions. Doctors may recommend this procedure for a number of different reasons. The most common reason is to evaluate chest pain. Chest pain can be a symptom of coronary artery disease (CAD), and cardiac catheterization can show whether plaque is narrowing or blocking your heart's arteries. This procedure is also used to evaluate the valves, as well as measure the blood flow and oxygen levels in different parts of your heart. For further information please visit [email or web address]. Please follow instruction sheet, as given.  Your physician recommends that you schedule a follow-up appointment PENDING YOUR TEST.

## 2014-08-29 ENCOUNTER — Ambulatory Visit (HOSPITAL_COMMUNITY)
Admission: RE | Admit: 2014-08-29 | Discharge: 2014-08-29 | Disposition: A | Discharge status: Home / Self Care | Payer: Medicare Other | Source: Ambulatory Visit | Attending: Interventional Cardiology | Admitting: Interventional Cardiology

## 2014-08-29 ENCOUNTER — Telehealth: Payer: Self-pay

## 2014-08-29 ENCOUNTER — Encounter (HOSPITAL_COMMUNITY): Payer: Self-pay | Admitting: Cardiology

## 2014-08-29 ENCOUNTER — Encounter (HOSPITAL_COMMUNITY)
Admission: RE | Disposition: A | Discharge status: Home / Self Care | Payer: Self-pay | Source: Ambulatory Visit | Attending: Interventional Cardiology

## 2014-08-29 DIAGNOSIS — I272 Other secondary pulmonary hypertension: Secondary | ICD-10-CM | POA: Insufficient documentation

## 2014-08-29 DIAGNOSIS — J45909 Unspecified asthma, uncomplicated: Secondary | ICD-10-CM | POA: Diagnosis not present

## 2014-08-29 DIAGNOSIS — G4733 Obstructive sleep apnea (adult) (pediatric): Secondary | ICD-10-CM | POA: Insufficient documentation

## 2014-08-29 DIAGNOSIS — R0600 Dyspnea, unspecified: Secondary | ICD-10-CM | POA: Insufficient documentation

## 2014-08-29 DIAGNOSIS — I340 Nonrheumatic mitral (valve) insufficiency: Secondary | ICD-10-CM | POA: Insufficient documentation

## 2014-08-29 DIAGNOSIS — R9439 Abnormal result of other cardiovascular function study: Secondary | ICD-10-CM

## 2014-08-29 DIAGNOSIS — M19011 Primary osteoarthritis, right shoulder: Secondary | ICD-10-CM | POA: Diagnosis not present

## 2014-08-29 DIAGNOSIS — K219 Gastro-esophageal reflux disease without esophagitis: Secondary | ICD-10-CM | POA: Diagnosis not present

## 2014-08-29 DIAGNOSIS — Z7982 Long term (current) use of aspirin: Secondary | ICD-10-CM | POA: Insufficient documentation

## 2014-08-29 DIAGNOSIS — E119 Type 2 diabetes mellitus without complications: Secondary | ICD-10-CM | POA: Diagnosis not present

## 2014-08-29 DIAGNOSIS — R0602 Shortness of breath: Secondary | ICD-10-CM

## 2014-08-29 DIAGNOSIS — I270 Primary pulmonary hypertension: Secondary | ICD-10-CM

## 2014-08-29 DIAGNOSIS — I420 Dilated cardiomyopathy: Secondary | ICD-10-CM | POA: Diagnosis present

## 2014-08-29 DIAGNOSIS — Z87891 Personal history of nicotine dependence: Secondary | ICD-10-CM | POA: Insufficient documentation

## 2014-08-29 HISTORY — PX: LEFT AND RIGHT HEART CATHETERIZATION WITH CORONARY ANGIOGRAM: SHX5449

## 2014-08-29 LAB — PROTIME-INR
INR: 1.05 (ref 0.00–1.49)
PROTHROMBIN TIME: 13.8 s (ref 11.6–15.2)

## 2014-08-29 LAB — GLUCOSE, CAPILLARY
Glucose-Capillary: 103 mg/dL — ABNORMAL HIGH (ref 70–99)
Glucose-Capillary: 87 mg/dL (ref 70–99)

## 2014-08-29 SURGERY — LEFT AND RIGHT HEART CATHETERIZATION WITH CORONARY ANGIOGRAM
Anesthesia: LOCAL

## 2014-08-29 MED ORDER — SODIUM CHLORIDE 0.9 % IV SOLN
INTRAVENOUS | Status: DC
  Administered 2014-08-29: 1000 mL via INTRAVENOUS

## 2014-08-29 MED ORDER — FENTANYL CITRATE 0.05 MG/ML IJ SOLN
INTRAMUSCULAR | Status: AC
  Filled 2014-08-29: qty 2

## 2014-08-29 MED ORDER — HEPARIN SODIUM (PORCINE) 1000 UNIT/ML IJ SOLN
INTRAMUSCULAR | Status: AC
  Filled 2014-08-29: qty 1

## 2014-08-29 MED ORDER — VERAPAMIL HCL 2.5 MG/ML IV SOLN
INTRAVENOUS | Status: AC
  Filled 2014-08-29: qty 2

## 2014-08-29 MED ORDER — SODIUM CHLORIDE 0.9 % IJ SOLN: 3.0000 mL | INTRAMUSCULAR | Status: DC | PRN

## 2014-08-29 MED ORDER — HEPARIN (PORCINE) IN NACL 2-0.9 UNIT/ML-% IJ SOLN
INTRAMUSCULAR | Status: AC
  Filled 2014-08-29: qty 1500

## 2014-08-29 MED ORDER — FUROSEMIDE 20 MG PO TABS: 20.0000 mg | ORAL_TABLET | Freq: Every day | ORAL | Status: DC

## 2014-08-29 MED ORDER — SODIUM CHLORIDE 0.9 % IV SOLN: 250.0000 mL | INTRAVENOUS | Status: DC | PRN

## 2014-08-29 MED ORDER — MIDAZOLAM HCL 2 MG/2ML IJ SOLN
INTRAMUSCULAR | Status: AC
  Filled 2014-08-29: qty 2

## 2014-08-29 MED ORDER — ASPIRIN 81 MG PO CHEW
81.0000 mg | CHEWABLE_TABLET | ORAL | Status: AC
  Administered 2014-08-29: 81 mg via ORAL

## 2014-08-29 MED ORDER — SODIUM CHLORIDE 0.9 % IJ SOLN: 3.0000 mL | Freq: Two times a day (BID) | INTRAMUSCULAR | Status: DC

## 2014-08-29 MED ORDER — ASPIRIN 81 MG PO CHEW
CHEWABLE_TABLET | ORAL | Status: AC
  Administered 2014-08-29: 81 mg via ORAL
  Filled 2014-08-29: qty 1

## 2014-08-29 MED ORDER — NITROGLYCERIN 1 MG/10 ML FOR IR/CATH LAB
INTRA_ARTERIAL | Status: AC
  Filled 2014-08-29: qty 10

## 2014-08-29 MED ORDER — SODIUM CHLORIDE 0.9 % IV SOLN: 1.0000 mL/kg/h | INTRAVENOUS | Status: DC

## 2014-08-29 MED ORDER — LIDOCAINE HCL (PF) 1 % IJ SOLN
INTRAMUSCULAR | Status: AC
  Filled 2014-08-29: qty 30

## 2014-08-29 NOTE — CV Procedure (Signed)
    Cardiac Catheterization Procedure Note  Name: Kim Mullins MRN: [id number] DOB: 1930-07-19  Procedure: Right Heart Cath, Left Heart Cath, Selective Coronary Angiography, LV angiography  Indication: 80 yo BF with dyspnea. EF 40-45% by Echo. Myoview is abnormal with inferior wall ischemia/infarct.   Procedural Details: The right wrist and groin were prepped, draped, and anesthetized with 1% lidocaine. Using the modified Seldinger technique a 6 Fr slender sheath was placed in the right radial artery and a 5 French sheath was placed in the right femoral vein. A Swan-Ganz catheter was used for the right heart catheterization. Standard protocol was followed for recording of right heart pressures and sampling of oxygen saturations. Fick cardiac output was calculated. Standard Judkins catheters were used for selective coronary angiography and left ventriculography. There were no immediate procedural complications. The patient was transferred to the post catheterization recovery area for further monitoring.  Procedural Findings: Hemodynamics RA 13/10 mean 9 mm Hg RV 51/7/12 mm Hg PA 44/16 mean 31 mm Hg PCWP Unable to obtain LV 118/22 mm Hg AO 119/68 mean 88 mm Hg  Oxygen saturations: PA 66% AO 98%  Cardiac Output (Fick) 4.5 L/min  Cardiac Index (Fick) 2.5 L/min/meter squared   Coronary angiography: Coronary dominance: left  Left mainstem: Mild calcification. Otherwise normal.  Left anterior descending (LAD): Normal  Left circumflex (LCx): Normal. Large dominant vessel.  Right coronary artery (RCA): nondominant. Normal.  Left ventriculography: Left ventricular systolic function is abnormal, there is severe global hypokinesis. LVEF is estimated at 30-35%, there is 2+ mitral regurgitation   Final Conclusions:   1. Normal coronary anatomy 2. Severe LV dysfunction. 3. Mild to moderate mitral insufficiency. 4. Mild to moderate pulmonary HTN.  Recommendations: Medical  therapy for nonischemic cardiomyopathy.   [name], [place] 08/29/2014, 8:35 AM

## 2014-08-29 NOTE — Telephone Encounter (Signed)
-----   Message from Sueanne Margarita, MD sent at 08/20/2014  2:16 PM EST ----- Elevated BNP - please start Lasix 20mg  daily and recheck BMET and BNP in 1 week

## 2014-08-29 NOTE — Discharge Instructions (Signed)
Radial Site Care °Refer to this sheet in the next few weeks. These instructions provide you with information on caring for yourself after your procedure. Your caregiver may also give you more specific instructions. Your treatment has been planned according to current medical practices, but problems sometimes occur. Call your caregiver if you have any problems or questions after your procedure. °HOME CARE INSTRUCTIONS °· You may shower the day after the procedure. Remove the bandage (dressing) and gently wash the site with plain soap and water. Gently pat the site dry. °· Do not apply powder or lotion to the site. °· Do not submerge the affected site in water for 3 to 5 days. °· Inspect the site at least twice daily. °· Do not flex or bend the affected arm for 24 hours. °· No lifting over 5 pounds (2.3 kg) for 5 days after your procedure. °· Do not drive home if you are discharged the same day of the procedure. Have someone else drive you. °· You may drive 24 hours after the procedure unless otherwise instructed by your caregiver. °· Do not operate machinery or power tools for 24 hours. °· A responsible adult should be with you for the first 24 hours after you arrive home. °What to expect: °· Any bruising will usually fade within 1 to 2 weeks. °· Blood that collects in the tissue (hematoma) may be painful to the touch. It should usually decrease in size and tenderness within 1 to 2 weeks. °SEEK IMMEDIATE MEDICAL CARE IF: °· You have unusual pain at the radial site. °· You have redness, warmth, swelling, or pain at the radial site. °· You have drainage (other than a small amount of blood on the dressing). °· You have chills. °· You have a fever or persistent symptoms for more than 72 hours. °· You have a fever and your symptoms suddenly get worse. °· Your arm becomes pale, cool, tingly, or numb. °· You have heavy bleeding from the site. Hold pressure on the site and call 911. °Document Released: 09/18/2010 Document  Revised: 11/08/2011 Document Reviewed: 09/18/2010 °ExitCare® Patient Information ©2015 [employer]. This information is not intended to replace advice given to you by your health care provider. Make sure you discuss any questions you have with your health care provider. ° ° °Angiogram, Care After °Refer to this sheet in the next few weeks. These instructions provide you with information on caring for yourself after your procedure. Your health care provider may also give you more specific instructions. Your treatment has been planned according to current medical practices, but problems sometimes occur. Call your health care provider if you have any problems or questions after your procedure.  °WHAT TO EXPECT AFTER THE PROCEDURE °After your procedure, it is typical to have the following sensations: °· Minor discomfort or tenderness and a small bump at the catheter insertion site. The bump should usually decrease in size and tenderness within 1 to 2 weeks. °· Any bruising will usually fade within 2 to 4 weeks. °HOME CARE INSTRUCTIONS  °· You may need to keep taking blood thinners if they were prescribed for you. Take medicines only as directed by your health care provider. °· Do not apply powder or lotion to the site. °· Do not take baths, swim, or use a hot tub until your health care provider approves. °· You may shower 24 hours after the procedure. Remove the bandage (dressing) and gently wash the site with plain soap and water. Gently pat the site dry. °· Inspect   the site at least twice daily.  Limit your activity for the first 48 hours. Do not bend, squat, or lift anything over 20 lb (9 kg) or as directed by your health care provider.  Plan to have someone take you home after the procedure. Follow instructions about when you can drive or return to work. SEEK MEDICAL CARE IF:  You get light-headed when standing up.  You have drainage (other than a small amount of blood on the dressing).  You have  chills.  You have a fever.  You have redness, warmth, swelling, or pain at the insertion site. SEEK IMMEDIATE MEDICAL CARE IF:   You develop chest pain or shortness of breath, feel faint, or pass out.  You have bleeding, swelling larger than a walnut, or drainage from the catheter insertion site.  You develop pain, discoloration, coldness, or severe bruising in the leg or arm that held the catheter.  You have heavy bleeding from the site. If this happens, hold pressure on the site and call 911. MAKE SURE YOU:  Understand these instructions.  Will watch your condition.  Will get help right away if you are not doing well or get worse. Document Released: 03/04/2005 Document Revised: 12/31/2013 Document Reviewed: 01/08/2013 [facility] Patient Information 2015 [place], [place]. This information is not intended to replace advice given to you by your health care provider. Make sure you discuss any questions you have with your health care provider.

## 2014-08-29 NOTE — Telephone Encounter (Signed)
Instructed patient to START Lasix 20 mg daily. Lab appointment scheduled for Jan 8th. Patient agrees with treatment plan.

## 2014-08-29 NOTE — H&P (View-Only) (Signed)
[place], [place], [place]  [id number] Phone: [phone] Fax:  [phone]  Date:  08/27/2014   ID:  Kim Mullins, DOB Jan 27, 1930, MRN [id number]  PCP:  Kristine Garbe, MD  Cardiologist:  Fransico Him, MD    History of Present Illness: Kim Mullins is a 80 y.o. female with a history of asthma, anemia, GERD with esophageal stricture s/p dilatation, DM diet controlled who presents today for followup of SOB. She has been seen by Dr. Melvyn Novas for wheezing and DOE. 2D echo was owas ordered by Dr. Melvyn Novas which showed mildly reduced LVF with EF 40-45% with severe hypokinesis of the inferior wall, moderately leaky MV, moderate PR and moderate pulmonary HTN. She says that DOE has been present since May. She denies any PND or orthopnea. She denies any LE edema, chest pain or pressure. She denies any palpitations, dizziness or syncope. She underwent nuclear stress test that showed an ntermediate risk stress nuclear study with a small, moderate intensity, reversible inferior defect consistent with mild inferior ischemia; study intermediate risk due to reduced LV function.  She now presents today for followup.    Wt Readings from Last 3 Encounters:  08/27/14 180 lb 3.2 oz (81.738 kg)  08/21/14 177 lb (80.287 kg)  08/16/14 177 lb 12.8 oz (80.65 kg)     Past Medical History  Diagnosis Date  . Bowel obstruction   . Left tibial fracture 2007  . Asthma     Allergixc reaction to cats.  . Anemia     occassionally  . Arthritis     all over.  . Diabetes mellitus 2006    Diet and exercise controlled.  Marland Kitchen GERD (gastroesophageal reflux disease)     occ  . Osteoarthritis of right shoulder region 06/26/2013  . Shortness of breath     with exertion- started in MAY 2015, "dry cough,wheezing"  . DCM (dilated cardiomyopathy) 08/16/2014    Current Outpatient Prescriptions  Medication Sig Dispense Refill  . aspirin EC 81 MG tablet Take 81 mg by mouth daily.    . bisacodyl  (DULCOLAX) 5 MG EC tablet Take 5 mg by mouth daily as needed for mild constipation or moderate constipation.    Marland Kitchen lisinopril (PRINIVIL,ZESTRIL) 5 MG tablet Take 1 tablet (5 mg total) by mouth daily. 30 tablet 3  . meloxicam (MOBIC) 15 MG tablet Take 15 mg by mouth daily.    . polyethylene glycol (MIRALAX / GLYCOLAX) packet Take 17 g by mouth daily.     No current facility-administered medications for this visit.    Allergies:    Allergies  Allergen Reactions  . Hydrocodone Other (See Comments)    Too away all energy and made her stop eating food for short time  . Percocet [Oxycodone-Acetaminophen] Other (See Comments)    Too away all energy and made her stop eating food for short time  . Mercurial Derivatives   . Eggs Or Egg-Derived Products Hives and Rash  . Penicillins Hives and Rash  . Quinine Derivatives Hives and Rash    Social History:  The patient  reports that she quit smoking about 36 years ago. Her smoking use included Cigarettes. She has a 45 pack-year smoking history. She has never used smokeless tobacco. She reports that she drinks about 4.2 oz of alcohol per week. She reports that she does not use illicit drugs.   Family History:  The patient's family history includes Anuerysm in her daughter; Cancer in her father; Coronary artery disease in  her mother; Dementia in her mother; Diabetes in her maternal grandfather. There is no history of Colon cancer.   ROS:  Please see the history of present illness.      All other systems reviewed and negative.   PHYSICAL EXAM: VS:  BP 118/72 mmHg  Pulse 92  Wt 180 lb 3.2 oz (81.738 kg)  SpO2 99% Well nourished, well developed, in no acute distress HEENT: normal Neck: no JVD Cardiac:  normal S1, S2; RRR; no murmur Lungs:  clear to auscultation bilaterally, no wheezing, rhonchi or rales Abd: soft, nontender, no hepatomegaly Ext: no edema Skin: warm and dry Neuro:  CNs 2-12 intact, no focal abnormalities noted  ASSESSMENT AND  PLAN:  1.  DOE with 2D echo showing mildly reduced LVF with inferior wall motion abnormality raising the concern for possible underlying CAD as the etiology of her DOE. Recent stress myoview showed ischemia.  She has a history of GERD and asthma as well. BNP in November was mildly elevated.Continue ASA. Continue Lisinopril 5mg  daily for afterload reduction for her DCM.  BB not started due to history of wheezing.  I have reviewed the findings of the stress test with her and recommend that we proceed with heart catheterization to evaluate coronary anatomy.  Cardiac catheterization was discussed with the patient fully including risks on myocardial infarction, death, stroke, bleeding, arrhythmia, dye allergy, renal insufficiency or bleeding.  All patient questions and concerns were discussed and the patient understands and is willing to proceed.   2.  HTN - well controlled 3.  Mild LV dysfunction EF 40-45% with inferior wall motion abnormality and anterior infarct on EKG. Add low dose ACE I but no BB due to history of Asthma and wheezing.   4.  Moderate pulmonary HTN of unclear etiology - ? Secondary to systolic CHF/MR/asthma/?OSA.Sleep study has been ordered to rule out OSA.  Will plan right heart cath at time of cath. 5.  She has a history of GERD and asthma as well.     Signed, Fransico Him, MD [facility] 08/27/2014 8:20 AM

## 2014-08-29 NOTE — Progress Notes (Signed)
Site area: rt groin Site Prior to Removal:  Level 0 Pressure Applied For: 15 minutes Manual:   yes Patient Status During Pull:  stable Post Pull Site:  Level 0 Post Pull Instructions Given:  yes Post Pull Pulses Present: yes Dressing Applied:  tegaderm Bedrest begins @ 0915 Comments: no complications

## 2014-08-29 NOTE — Interval H&P Note (Signed)
History and Physical Interval Note:  08/29/2014 7:33 AM  Kim Mullins  has presented today for surgery, with the diagnosis of abnormal mioview  The various methods of treatment have been discussed with the patient and family. After consideration of risks, benefits and other options for treatment, the patient has consented to  Procedure(s): LEFT AND RIGHT HEART CATHETERIZATION WITH CORONARY ANGIOGRAM (N/A) as a surgical intervention .  The patient's history has been reviewed, patient examined, no change in status, stable for surgery.  I have reviewed the patient's chart and labs.  Questions were answered to the patient's satisfaction.   Cath Lab Visit (complete for each Cath Lab visit)  Clinical Evaluation Leading to the Procedure:   ACS: No.  Non-ACS:    Anginal Classification: CCS III  Anti-ischemic medical therapy: No Therapy  Non-Invasive Test Results: Intermediate-risk stress test findings: cardiac mortality 1-3%/year  Prior CABG: No previous CABG        [name] [facility] 08/29/2014 7:34 AM

## 2014-08-30 ENCOUNTER — Ambulatory Visit (HOSPITAL_BASED_OUTPATIENT_CLINIC_OR_DEPARTMENT_OTHER): Payer: Medicare Other | Attending: Cardiology | Admitting: *Deleted

## 2014-08-30 VITALS — Ht 62.0 in | Wt 175.0 lb

## 2014-08-30 DIAGNOSIS — G4733 Obstructive sleep apnea (adult) (pediatric): Secondary | ICD-10-CM | POA: Insufficient documentation

## 2014-08-30 DIAGNOSIS — R0602 Shortness of breath: Secondary | ICD-10-CM | POA: Insufficient documentation

## 2014-08-30 DIAGNOSIS — I272 Other secondary pulmonary hypertension: Secondary | ICD-10-CM | POA: Insufficient documentation

## 2014-08-30 LAB — POCT I-STAT 3, ART BLOOD GAS (G3+)
Acid-base deficit: 2 mmol/L (ref 0.0–2.0)
Bicarbonate: 23.4 mEq/L (ref 20.0–24.0)
O2 Saturation: 98 %
PCO2 ART: 40 mmHg (ref 35.0–45.0)
TCO2: 25 mmol/L (ref 0–100)
pH, Arterial: 7.376 (ref 7.350–7.450)
pO2, Arterial: 101 mmHg — ABNORMAL HIGH (ref 80.0–100.0)

## 2014-08-30 LAB — POCT I-STAT 3, VENOUS BLOOD GAS (G3P V)
Acid-base deficit: 3 mmol/L — ABNORMAL HIGH (ref 0.0–2.0)
BICARBONATE: 22.9 meq/L (ref 20.0–24.0)
O2 SAT: 66 %
PCO2 VEN: 42.8 mmHg — AB (ref 45.0–50.0)
PO2 VEN: 36 mmHg (ref 30.0–45.0)
TCO2: 24 mmol/L (ref 0–100)
pH, Ven: 7.336 — ABNORMAL HIGH (ref 7.250–7.300)

## 2014-08-30 LAB — POCT ACTIVATED CLOTTING TIME: Activated Clotting Time: 140 seconds

## 2014-09-03 ENCOUNTER — Other Ambulatory Visit: Payer: Medicare Other

## 2014-09-03 ENCOUNTER — Ambulatory Visit: Payer: Medicare Other | Admitting: Physician Assistant

## 2014-09-06 ENCOUNTER — Telehealth: Payer: Self-pay

## 2014-09-06 ENCOUNTER — Other Ambulatory Visit (INDEPENDENT_AMBULATORY_CARE_PROVIDER_SITE_OTHER): Payer: Medicare Other | Admitting: *Deleted

## 2014-09-06 ENCOUNTER — Encounter: Payer: Self-pay | Admitting: Cardiology

## 2014-09-06 DIAGNOSIS — R0600 Dyspnea, unspecified: Secondary | ICD-10-CM

## 2014-09-06 DIAGNOSIS — I270 Primary pulmonary hypertension: Secondary | ICD-10-CM

## 2014-09-06 DIAGNOSIS — I272 Pulmonary hypertension, unspecified: Secondary | ICD-10-CM

## 2014-09-06 LAB — BASIC METABOLIC PANEL
BUN: 21 mg/dL (ref 6–23)
CHLORIDE: 108 meq/L (ref 96–112)
CO2: 27 mEq/L (ref 19–32)
CREATININE: 1.1 mg/dL (ref 0.4–1.2)
Calcium: 9.2 mg/dL (ref 8.4–10.5)
GFR: 64.52 mL/min (ref >60.00)
Glucose, Bld: 92 mg/dL (ref 70–99)
Potassium: 4.9 mEq/L (ref 3.5–5.1)
Sodium: 142 mEq/L (ref 135–145)

## 2014-09-06 LAB — BRAIN NATRIURETIC PEPTIDE: PRO B NATRI PEPTIDE: 258 pg/mL — AB (ref 0.0–100.0)

## 2014-09-06 NOTE — Telephone Encounter (Signed)
Per Dr. Radford Pax, called patient to address MyChart messages. Went over results of catheterization with patient. Verified that patient that her blood work was drawn today. Patient satisfied with conversation.

## 2014-09-12 ENCOUNTER — Telehealth: Payer: Self-pay | Admitting: Cardiology

## 2014-09-12 DIAGNOSIS — G4730 Sleep apnea, unspecified: Secondary | ICD-10-CM

## 2014-09-12 NOTE — Sleep Study (Signed)
   NAME: Kim Mullins DATE OF BIRTH:  22-May-1930 MEDICAL RECORD NUMBER [id number]  LOCATION: [facility]  PHYSICIAN: TURNER,TRACI R  DATE OF STUDY: 08/30/2014  SLEEP STUDY TYPE: Split night Nocturnal polysomnogram and Positive Airway Pressure Titration               REFERRING PHYSICIAN: Sueanne Margarita, MD  INDICATION FOR STUDY: excessive daytime sleepiness and snoring  EPWORTH SLEEPINESS SCORE: 14 HEIGHT: 5\' 2"  (157.5 cm)  WEIGHT: 175 lb (79.379 kg)    Body mass index is 32 kg/(m^2).  NECK SIZE: 16 in.  MEDICATIONS: Reviewed in the chart  SLEEP ARCHITECTURE: During the diagnostic portion of the study the patient slept for a total of 121 minutes with no 0.5 minutes of slow wave sleep and 11 minutes of REM sleep.  The onset to sleep latency was 5 minutes and onset to REM latency was 63 minutes.  The sleep efficiency was normal at 86%.  During the CPAP titration, the total sleep time was 186 minutes with no slow wave sleep and 13 minutes of REM sleep.  The onset the sleep latency was 0 and the onset to REM sleep latency was delayed at 154 minutes.  The sleep efficiency was 76%.    RESPIRATORY DATA: During the diagnostic portion, there were 12 apneas, of which, 10 were obstructive, 1 central and 1 mixed apnea.  There were 32 hypopneas. Most events occurred in the nonsupine position during NREM sleep.  There was mild snoring.  The total AHI was 21.7 events per hour consistent with moderate obstructive sleep apnea/hypopnea syndrome.  The patient was started on CPAP at 4cm H2O and titrated to CPAP at 11cm H2O but could not be adequately titrated due to continued respiratory events.  The patient was started on BiPAP and titrated to 11/6cm H2O but could not be adequately titrated due to continued respiratory events.  The AHI was 74 events per hour at BiPAP of 11/6cm H2O.  OXYGEN DATA: The nadir O2 saturation during the diagnostic portion was 82% in NREM and 83% in REM.   The O2 saturation nadir during the CPAP titration was 89%.   CARDIAC DATA: The patient maintained NSR during the study.  MOVEMENT/PARASOMNIA: There were increased periodic limb movements during the study with an index of 5.9 events per hour.  None were noted during the CPAP titration. There were no REM Behavior disorders noted during sleep.  IMPRESSION/ RECOMMENDATION:   1.  Moderate obstructive sleep apnea/hypopnea syndrome with an AHI of 21.7 events per hour.  Most events occurred during NREM sleep in the nonsupine position.   2.  Unsuccessful CPAP titration due to continue respiratory events.  Recommend repeat titration using BIPAP. 3.  Reduced sleep efficieincy with increased frequency of arousals due to respiratory events. 4.  Significant O2 desaturations were noted with respiratory events.   5.  Periodic limb movements were noted during sleep that resolved with CPAP.  Signed:   Sueanne Margarita Diplomate, American Board of Sleep Medicine  ELECTRONICALLY SIGNED ON:  09/12/2014, 6:21 AM [place] PH: [phone]   FX: [phone] [place]

## 2014-09-12 NOTE — Telephone Encounter (Signed)
Please let patient know that she has moderate sleep apnea but could not be adequately titrated on CPAP.  Please set her up for BIPAP titration.

## 2014-09-13 ENCOUNTER — Telehealth: Payer: Self-pay

## 2014-09-13 DIAGNOSIS — R0600 Dyspnea, unspecified: Secondary | ICD-10-CM

## 2014-09-13 MED ORDER — FUROSEMIDE 40 MG PO TABS: 40.0000 mg | ORAL_TABLET | Freq: Every day | ORAL | Status: DC

## 2014-09-13 NOTE — Telephone Encounter (Signed)
Patient informed of results and verbal understanding expressed.  BiPAP titration ordered for scheduling.  Next OV rescheduled per patient request.

## 2014-09-13 NOTE — Telephone Encounter (Signed)
-----   Message from Sueanne Margarita, MD sent at 09/09/2014  9:33 PM EST ----- BNP slightly improved but still elevated - increase Lasix to 40mg  daily and repeat BMET in 1 week

## 2014-09-13 NOTE — Telephone Encounter (Signed)
Patient informed of results and verbal understanding expressed.  Instructed patient to INCREASE lasix to 40 mg daily. Repeat BMET scheduled for 1/22. Patient agrees with treatment plan.

## 2014-09-13 NOTE — Telephone Encounter (Signed)
Left message to call back  

## 2014-09-13 NOTE — Addendum Note (Signed)
Addended by: Harland German A on: 09/13/2014 04:19 PM   Modules accepted: Orders

## 2014-09-18 ENCOUNTER — Encounter: Payer: Self-pay | Admitting: Cardiology

## 2014-09-20 ENCOUNTER — Encounter: Payer: Self-pay | Admitting: Cardiology

## 2014-09-20 ENCOUNTER — Other Ambulatory Visit: Payer: Medicare Other

## 2014-09-27 ENCOUNTER — Ambulatory Visit: Payer: Medicare Other | Admitting: Cardiology

## 2014-09-30 ENCOUNTER — Encounter: Payer: Self-pay | Admitting: Cardiovascular Disease

## 2014-10-01 ENCOUNTER — Other Ambulatory Visit (INDEPENDENT_AMBULATORY_CARE_PROVIDER_SITE_OTHER): Payer: Medicare Other | Admitting: *Deleted

## 2014-10-01 DIAGNOSIS — R0600 Dyspnea, unspecified: Secondary | ICD-10-CM

## 2014-10-01 LAB — BASIC METABOLIC PANEL
BUN: 17 mg/dL (ref 6–23)
CALCIUM: 9 mg/dL (ref 8.4–10.5)
CHLORIDE: 104 meq/L (ref 96–112)
CO2: 31 meq/L (ref 19–32)
Creatinine, Ser: 1.13 mg/dL (ref 0.40–1.20)
GFR: 59.27 mL/min — ABNORMAL LOW (ref >60.00)
GLUCOSE: 80 mg/dL (ref 70–99)
Potassium: 4.3 mEq/L (ref 3.5–5.1)
Sodium: 139 mEq/L (ref 135–145)

## 2014-10-04 ENCOUNTER — Encounter: Payer: Self-pay | Admitting: Cardiology

## 2014-10-04 ENCOUNTER — Ambulatory Visit (INDEPENDENT_AMBULATORY_CARE_PROVIDER_SITE_OTHER): Payer: Medicare Other | Admitting: Cardiology

## 2014-10-04 VITALS — BP 104/68 | HR 83 | Ht 62.4 in | Wt 182.1 lb

## 2014-10-04 DIAGNOSIS — I270 Primary pulmonary hypertension: Secondary | ICD-10-CM

## 2014-10-04 DIAGNOSIS — I420 Dilated cardiomyopathy: Secondary | ICD-10-CM

## 2014-10-04 DIAGNOSIS — R0600 Dyspnea, unspecified: Secondary | ICD-10-CM

## 2014-10-04 DIAGNOSIS — I5023 Acute on chronic systolic (congestive) heart failure: Secondary | ICD-10-CM | POA: Insufficient documentation

## 2014-10-04 DIAGNOSIS — I272 Pulmonary hypertension, unspecified: Secondary | ICD-10-CM

## 2014-10-04 DIAGNOSIS — I5022 Chronic systolic (congestive) heart failure: Secondary | ICD-10-CM

## 2014-10-04 DIAGNOSIS — I10 Essential (primary) hypertension: Secondary | ICD-10-CM | POA: Insufficient documentation

## 2014-10-04 LAB — BRAIN NATRIURETIC PEPTIDE: Pro B Natriuretic peptide (BNP): 417 pg/mL — ABNORMAL HIGH (ref 0.0–100.0)

## 2014-10-04 MED ORDER — SPIRONOLACTONE 25 MG PO TABS: 12.5000 mg | ORAL_TABLET | Freq: Every day | ORAL | Status: DC

## 2014-10-04 MED ORDER — IVABRADINE HCL 5 MG PO TABS: 5.0000 mg | ORAL_TABLET | Freq: Two times a day (BID) | ORAL | Status: DC

## 2014-10-04 NOTE — Progress Notes (Signed)
Cardiology Office Note   Date:  10/04/2014   ID:  Kim Mullins, DOB 03-06-1930, MRN [id number]  PCP:  Kristine Garbe, MD  Cardiologist:   Sueanne Margarita, MD   Chief Complaint  Patient presents with  . Congestive Heart Failure      History of Present Illness: Kim Mullins is a 81 y.o. female with a history of asthma, anemia, GERD with esophageal stricture s/p dilatation, DM diet controlled who presents today for followup of SOB. She has been seen by Dr. Melvyn Novas for wheezing and DOE. 2D echo was owas ordered by Dr. Melvyn Novas which showed mildly reduced LVF with EF 40-45% with severe hypokinesis of the inferior wall, moderately leaky MV, moderate PR and moderate pulmonary HTN. She says that DOE has been present since May. She denies any PND or orthopnea. She denies any LE edema, chest pain or pressure. She denies any palpitations, dizziness or syncope. She underwent nuclear stress test that showed an ntermediate risk stress nuclear study with a small, moderate intensity, reversible inferior defect consistent with mild inferior ischemia; study intermediate risk due to reduced LV function. She underwent cath showing normal coronary arteries with severe LV dysfunction EF 30-35% with 2+ MR and mild to moderate pulmonary HTN.  She now presents today for followup. She says that her cough has improved but she is still SOB.  She has not had any LE edema.  She denies any LE edema, chest pain, dizziness or syncope.  Her weight is up 2 pounds since her cath.  She does salt her food.    Past Medical History  Diagnosis Date  . Bowel obstruction   . Left tibial fracture 2007  . Asthma     Allergixc reaction to cats.  . Anemia     occassionally  . Arthritis     all over.  . Diabetes mellitus 2006    Diet and exercise controlled.  Marland Kitchen GERD (gastroesophageal reflux disease)     occ  . Osteoarthritis of right shoulder region 06/26/2013  . Shortness of breath     with exertion- started in  MAY 2015, "dry cough,wheezing"  . DCM (dilated cardiomyopathy) 08/16/2014    normal coronary arteries on cath with EF 30-45%   . Chronic systolic CHF (congestive heart failure) 10/04/2014  . Chronic systolic CHF (congestive heart failure)     EF 30-35% by cath    Past Surgical History  Procedure Laterality Date  . Abdominal hysterectomy  1969  . Dilation and curettage of uterus  1968  . Pilonidal cyst excision  1959  . Corn removal  1999    Bilateral feet  . Bunionectomy  1984    Bilateral  . Tonsillectomy  1942  . Eye surgery  1983    Surgery to fix Retainal detachment, bilateral  . Carpal tunnell  2004    Bilateral  . Total hip arthroplasty  11/16/2011    Procedure: TOTAL HIP ARTHROPLASTY ANTERIOR APPROACH;  Surgeon: Hessie Dibble, MD;  Location: [facility];  Service: Orthopedics;  Laterality: Right;  DEPUY  . Total shoulder arthroplasty Right 06/26/2013    Procedure: TOTAL SHOULDER ARTHROPLASTY;  Surgeon: Johnny Bridge, MD;  Location: [facility];  Service: Orthopedics;  Laterality: Right;  . Cataract extraction Right     early 2015  . Joint replacement    . Esophagogastroduodenoscopy (egd) with propofol N/A 06/11/2014    Procedure: ESOPHAGOGASTRODUODENOSCOPY (EGD) WITH PROPOFOL;  Surgeon: Inda Castle, MD;  Location: [facility];  Service: Endoscopy;  Laterality: N/A;  . Bravo ph study N/A 06/11/2014    Procedure: BRAVO [place] STUDY;  Surgeon: Inda Castle, MD;  Location: [facility];  Service: Endoscopy;  Laterality: N/A;  Venia Minks dilation  06/11/2014    Procedure: Venia Minks DILATION;  Surgeon: Inda Castle, MD;  Location: [facility];  Service: Endoscopy;;  . Left and right heart catheterization with coronary angiogram N/A 08/29/2014    Procedure: LEFT AND RIGHT HEART CATHETERIZATION WITH CORONARY ANGIOGRAM;  Surgeon: Peter M Martinique, MD;  Location: [facility];  Service: Cardiovascular;  Laterality: N/A;     Current Outpatient Prescriptions  Medication Sig Dispense  Refill  . aspirin EC 81 MG tablet Take 81 mg by mouth daily.    . bisacodyl (DULCOLAX) 5 MG EC tablet Take 5 mg by mouth daily as needed for mild constipation or moderate constipation.    . furosemide (LASIX) 40 MG tablet Take 1 tablet (40 mg total) by mouth daily. 30 tablet 6  . lisinopril (PRINIVIL,ZESTRIL) 5 MG tablet Take 1 tablet (5 mg total) by mouth daily. 30 tablet 3  . meloxicam (MOBIC) 15 MG tablet Take 15 mg by mouth daily.  5  . polyethylene glycol (MIRALAX / GLYCOLAX) packet Take 17 g by mouth daily.     No current facility-administered medications for this visit.    Allergies:   Hydrocodone; Percocet; Mercurial derivatives; Eggs or egg-derived products; Penicillins; and Quinine derivatives    Social History:  The patient  reports that she quit smoking about 36 years ago. Her smoking use included Cigarettes. She has a 45 pack-year smoking history. She has never used smokeless tobacco. She reports that she drinks about 4.2 oz of alcohol per week. She reports that she does not use illicit drugs.   Family History:  The patient's family history includes Anuerysm in her daughter; Cancer in her father; Coronary artery disease in her mother; Dementia in her mother; Diabetes in her maternal grandfather. There is no history of Colon cancer.    ROS:  Please see the history of present illness.   Otherwise, review of systems are positive for none.   All other systems are reviewed and negative.    PHYSICAL EXAM: VS:  BP 104/68 mmHg  Pulse 83  Ht 5' 2.4" (1.585 m)  Wt 182 lb 1.9 oz (82.609 kg)  BMI 32.88 kg/m2  SpO2 98% , BMI Body mass index is 32.88 kg/(m^2). GEN: Well nourished, well developed, in no acute distress HEENT: normal Neck: no JVD, carotid bruits, or masses Cardiac: RRR; no murmurs, rubs, or gallops,no edema  Respiratory:  clear to auscultation bilaterally, normal work of breathing GI: soft, nontender, nondistended, + BS MS: no deformity or atrophy Skin: warm and dry,  no rash Neuro:  Strength and sensation are intact Psych: euthymic mood, full affect   EKG:  EKG is not ordered today.    Recent Labs: 02/09/2014: ALT 68* 04/29/2014: TSH 1.46 08/27/2014: Hemoglobin 12.2; Platelets 296.0 09/06/2014: Pro B Natriuretic peptide (BNP) 258.0* 10/01/2014: BUN 17; Creatinine 1.13; Potassium 4.3; Sodium 139    Lipid Panel No results found for: CHOL, TRIG, HDL, CHOLHDL, VLDL, LDLCALC, LDLDIRECT    Wt Readings from Last 3 Encounters:  10/04/14 182 lb 1.9 oz (82.609 kg)  08/30/14 175 lb (79.379 kg)  08/29/14 175 lb (79.379 kg)      Other studies Reviewed: Additional studies/ records that were reviewed today include: cardiac cath. Review of the above records demonstrates: normal coronary arteries with severe LV dysfunction  ASSESSMENT AND PLAN:  1. DOE secondary to nonischemic DCM 2. HTN - well controlled.  Continue ACE I 3. Nonischemic DCM with severe LV dysfunction EF 30-35% by cath - Continue ACE I.  No BB due to history of Asthma and wheezing.  Will add Colanor 5mg  BID since HR 83bpm 4. Mild to moderate pulmonary HTN of unclear etiology - ? Secondary to systolic CHF/MR/asthma/?OSA.Sleep study has been ordered to rule out OSA.  5. She has a history of GERD and asthma as well.  6.  Normal coronary arteries by cath 7.  Chronic systolic CHF - appears euvolemic on exam today but she is complaining of persistent SOB but also has asthma.  I will get a  BNP today. I am going to add Aldactone 12.5mg  daily to her CHF regimen.  Her HR is still elevated but cannot use BB due to asthma and would avoid CCB due to LV dysfunction.  I will add Colanor 5mg  BID given that HR is still in 80's with CHF.    Current medicines are reviewed at length with the patient today.  The patient does not have concerns regarding medicines.  The following changes have been made:  no change  Labs/ tests ordered today include:  No orders of the defined types were placed in  this encounter.     Disposition:   FU with me in 3 months   Signed, Sueanne Margarita, MD  10/04/2014 12:22 PM    [facility] [address], [place], [place]  [address] Phone: [phone]; Fax: [phone]

## 2014-10-04 NOTE — Patient Instructions (Signed)
Your physician has recommended you make the following change in your medication:  1) START Corlanor 5 mg twice daily 2) START Aldactone 12.5 mg daily  Your physician has requested that you have an echocardiogram IN TWO MONTHS. Echocardiography is a painless test that uses sound waves to create images of your heart. It provides your doctor with information about the size and shape of your heart and how well your heart's chambers and valves are working. This procedure takes approximately one hour. There are no restrictions for this procedure.  Your physician recommends that you have lab work TODAY (BNP).  Your physician recommends that you return for lab work in: [place] (BMET)  Your physician recommends that you schedule a follow-up appointment in: 2 weeks with an NP or PA  Your physician recommends that you schedule a follow-up appointment in: 3 months with Dr. Radford Pax.

## 2014-10-06 ENCOUNTER — Inpatient Hospital Stay (HOSPITAL_COMMUNITY)
Admission: EM | Admit: 2014-10-06 | Discharge: 2014-10-08 | DRG: 243 | Disposition: A | Discharge status: Home / Self Care | Payer: Medicare Other | Attending: Internal Medicine | Admitting: Internal Medicine

## 2014-10-06 ENCOUNTER — Emergency Department (HOSPITAL_COMMUNITY): Payer: Medicare Other

## 2014-10-06 ENCOUNTER — Encounter (HOSPITAL_COMMUNITY): Payer: Self-pay | Admitting: Physical Medicine and Rehabilitation

## 2014-10-06 DIAGNOSIS — Z87891 Personal history of nicotine dependence: Secondary | ICD-10-CM | POA: Diagnosis not present

## 2014-10-06 DIAGNOSIS — Z96641 Presence of right artificial hip joint: Secondary | ICD-10-CM | POA: Diagnosis present

## 2014-10-06 DIAGNOSIS — I442 Atrioventricular block, complete: Secondary | ICD-10-CM | POA: Diagnosis present

## 2014-10-06 DIAGNOSIS — Z880 Allergy status to penicillin: Secondary | ICD-10-CM

## 2014-10-06 DIAGNOSIS — Z8249 Family history of ischemic heart disease and other diseases of the circulatory system: Secondary | ICD-10-CM | POA: Diagnosis not present

## 2014-10-06 DIAGNOSIS — Z7982 Long term (current) use of aspirin: Secondary | ICD-10-CM | POA: Diagnosis not present

## 2014-10-06 DIAGNOSIS — Z950 Presence of cardiac pacemaker: Secondary | ICD-10-CM

## 2014-10-06 DIAGNOSIS — Z885 Allergy status to narcotic agent status: Secondary | ICD-10-CM

## 2014-10-06 DIAGNOSIS — Z91012 Allergy to eggs: Secondary | ICD-10-CM | POA: Diagnosis not present

## 2014-10-06 DIAGNOSIS — K219 Gastro-esophageal reflux disease without esophagitis: Secondary | ICD-10-CM | POA: Diagnosis present

## 2014-10-06 DIAGNOSIS — R0600 Dyspnea, unspecified: Secondary | ICD-10-CM

## 2014-10-06 DIAGNOSIS — I5022 Chronic systolic (congestive) heart failure: Secondary | ICD-10-CM | POA: Diagnosis present

## 2014-10-06 DIAGNOSIS — Z888 Allergy status to other drugs, medicaments and biological substances status: Secondary | ICD-10-CM | POA: Diagnosis not present

## 2014-10-06 DIAGNOSIS — I10 Essential (primary) hypertension: Secondary | ICD-10-CM | POA: Diagnosis present

## 2014-10-06 DIAGNOSIS — E119 Type 2 diabetes mellitus without complications: Secondary | ICD-10-CM | POA: Diagnosis present

## 2014-10-06 DIAGNOSIS — I420 Dilated cardiomyopathy: Secondary | ICD-10-CM | POA: Diagnosis present

## 2014-10-06 DIAGNOSIS — Z79899 Other long term (current) drug therapy: Secondary | ICD-10-CM

## 2014-10-06 DIAGNOSIS — R5383 Other fatigue: Secondary | ICD-10-CM | POA: Diagnosis present

## 2014-10-06 DIAGNOSIS — Z833 Family history of diabetes mellitus: Secondary | ICD-10-CM

## 2014-10-06 HISTORY — DX: Atrioventricular block, complete: I44.2

## 2014-10-06 LAB — CBC WITH DIFFERENTIAL/PLATELET
Basophils Absolute: 0 10*3/uL (ref 0.0–0.1)
Basophils Relative: 1 % (ref 0–1)
Eosinophils Absolute: 0.1 10*3/uL (ref 0.0–0.7)
Eosinophils Relative: 2 % (ref 0–5)
HEMATOCRIT: 37.2 % (ref 36.0–46.0)
HEMOGLOBIN: 11.8 g/dL — AB (ref 12.0–15.0)
LYMPHS PCT: 26 % (ref 12–46)
Lymphs Abs: 1.5 10*3/uL (ref 0.7–4.0)
MCH: 28 pg (ref 26.0–34.0)
MCHC: 31.7 g/dL (ref 30.0–36.0)
MCV: 88.2 fL (ref 78.0–100.0)
Monocytes Absolute: 0.4 10*3/uL (ref 0.1–1.0)
Monocytes Relative: 6 % (ref 3–12)
NEUTROS ABS: 3.8 10*3/uL (ref 1.7–7.7)
NEUTROS PCT: 65 % (ref 43–77)
Platelets: 218 10*3/uL (ref 150–400)
RBC: 4.22 MIL/uL (ref 3.87–5.11)
RDW: 14.5 % (ref 11.5–15.5)
WBC: 5.8 10*3/uL (ref 4.0–10.5)

## 2014-10-06 LAB — URINALYSIS, ROUTINE W REFLEX MICROSCOPIC
GLUCOSE, UA: NEGATIVE mg/dL
KETONES UR: 15 mg/dL — AB
NITRITE: NEGATIVE
Protein, ur: 30 mg/dL — AB
Specific Gravity, Urine: 1.02 (ref 1.005–1.030)
UROBILINOGEN UA: 0.2 mg/dL (ref 0.0–1.0)
pH: 5 (ref 5.0–8.0)

## 2014-10-06 LAB — COMPREHENSIVE METABOLIC PANEL
ALK PHOS: 61 U/L (ref 39–117)
ALT: 23 U/L (ref 0–35)
AST: 46 U/L — AB (ref 0–37)
Albumin: 3.5 g/dL (ref 3.5–5.2)
Anion gap: 8 (ref 5–15)
BILIRUBIN TOTAL: 0.8 mg/dL (ref 0.3–1.2)
BUN: 22 mg/dL (ref 6–23)
CO2: 24 mmol/L (ref 19–32)
Calcium: 8.7 mg/dL (ref 8.4–10.5)
Chloride: 107 mmol/L (ref 96–112)
Creatinine, Ser: 1.35 mg/dL — ABNORMAL HIGH (ref 0.50–1.10)
GFR calc non Af Amer: 35 mL/min — ABNORMAL LOW (ref >90)
GFR, EST AFRICAN AMERICAN: 41 mL/min — AB (ref >90)
GLUCOSE: 169 mg/dL — AB (ref 70–99)
Potassium: 4.2 mmol/L (ref 3.5–5.1)
Sodium: 139 mmol/L (ref 135–145)
TOTAL PROTEIN: 6.8 g/dL (ref 6.0–8.3)

## 2014-10-06 LAB — GLUCOSE, CAPILLARY: GLUCOSE-CAPILLARY: 149 mg/dL — AB (ref 70–99)

## 2014-10-06 LAB — URINE MICROSCOPIC-ADD ON

## 2014-10-06 LAB — MAGNESIUM: Magnesium: 2.2 mg/dL (ref 1.5–2.5)

## 2014-10-06 LAB — CBG MONITORING, ED: GLUCOSE-CAPILLARY: 154 mg/dL — AB (ref 70–99)

## 2014-10-06 LAB — TROPONIN I: Troponin I: 0.1 ng/mL — ABNORMAL HIGH (ref <0.031)

## 2014-10-06 LAB — MRSA PCR SCREENING: MRSA BY PCR: NEGATIVE

## 2014-10-06 MED ORDER — ONDANSETRON HCL 4 MG/2ML IJ SOLN
4.0000 mg | Freq: Four times a day (QID) | INTRAMUSCULAR | Status: DC | PRN
  Administered 2014-10-06 – 2014-10-07 (×2): 4 mg via INTRAVENOUS
  Filled 2014-10-06 (×2): qty 2

## 2014-10-06 MED ORDER — ONDANSETRON HCL 4 MG/2ML IJ SOLN
INTRAMUSCULAR | Status: AC
Start: 2014-10-06 — End: 2014-10-06
  Administered 2014-10-06: 4 mg via INTRAVENOUS
  Filled 2014-10-06: qty 2

## 2014-10-06 MED ORDER — SODIUM CHLORIDE 0.9 % IJ SOLN: 3.0000 mL | INTRAMUSCULAR | Status: DC | PRN

## 2014-10-06 MED ORDER — DOPAMINE-DEXTROSE 3.2-5 MG/ML-% IV SOLN
INTRAVENOUS | Status: AC
  Filled 2014-10-06: qty 250

## 2014-10-06 MED ORDER — ASPIRIN EC 81 MG PO TBEC
81.0000 mg | DELAYED_RELEASE_TABLET | Freq: Every day | ORAL | Status: DC
Start: 2014-10-06 — End: 2014-10-08
  Administered 2014-10-06 – 2014-10-08 (×3): 81 mg via ORAL
  Filled 2014-10-06 (×3): qty 1

## 2014-10-06 MED ORDER — SODIUM CHLORIDE 0.9 % IV SOLN: 250.0000 mL | INTRAVENOUS | Status: DC | PRN

## 2014-10-06 MED ORDER — SODIUM CHLORIDE 0.9 % IJ SOLN
3.0000 mL | Freq: Two times a day (BID) | INTRAMUSCULAR | Status: DC
  Administered 2014-10-06 – 2014-10-07 (×3): 3 mL via INTRAVENOUS

## 2014-10-06 NOTE — ED Notes (Signed)
Pts weighs herself every morning. Pt's stated weight if from her daily weight today. 177lb

## 2014-10-06 NOTE — Progress Notes (Signed)
Signed and held order for NPO at midnight pre procedure. Pt states Dr. Lovena Le said she could eat breakfast in the morning.  Dr. Lovena Le paged; states that patient may have a full breakfast in the morning.

## 2014-10-06 NOTE — H&P (Signed)
Kim Mullins is an 81 y.o. female.   Chief Complaint: dizziness and near syncope HPI: The patient is a very pleasant 81 year old woman with a history of chronic systolic heart failure, with no coronary disease, on maximal medical therapy, except she has not been on a beta blocker. She saw her primary cardiologist Dr. Radford Pax in the office 2 days ago. A prescription for Corlanor was given but she did not take the medication. She began to feel weak and tired and dizzy yesterday. She most passed out. Today she presents to the hospital and was found to be in complete heart block with a ventricular escape of 35 bpm. She is admitted for additional evaluation and treatment. She has mild increase in her dyspnea. No peripheral edema to speak of. She is on no AV nodal blocking agents.  Past Medical History  Diagnosis Date  . Bowel obstruction   . Left tibial fracture 2007  . Asthma     Allergixc reaction to cats.  . Anemia     occassionally  . Arthritis     all over.  . Diabetes mellitus 2006    Diet and exercise controlled.  Marland Kitchen GERD (gastroesophageal reflux disease)     occ  . Osteoarthritis of right shoulder region 06/26/2013  . Shortness of breath     with exertion- started in MAY 2015, "dry cough,wheezing"  . DCM (dilated cardiomyopathy) 08/16/2014    normal coronary arteries on cath with EF 30-45%   . Chronic systolic CHF (congestive heart failure) 10/04/2014  . Chronic systolic CHF (congestive heart failure)     EF 30-35% by cath    Past Surgical History  Procedure Laterality Date  . Abdominal hysterectomy  1969  . Dilation and curettage of uterus  1968  . Pilonidal cyst excision  1959  . Corn removal  1999    Bilateral feet  . Bunionectomy  1984    Bilateral  . Tonsillectomy  1942  . Eye surgery  1983    Surgery to fix Retainal detachment, bilateral  . Carpal tunnell  2004    Bilateral  . Total hip arthroplasty  11/16/2011    Procedure: TOTAL HIP ARTHROPLASTY ANTERIOR  APPROACH;  Surgeon: Hessie Dibble, MD;  Location: [facility];  Service: Orthopedics;  Laterality: Right;  DEPUY  . Total shoulder arthroplasty Right 06/26/2013    Procedure: TOTAL SHOULDER ARTHROPLASTY;  Surgeon: Johnny Bridge, MD;  Location: [facility];  Service: Orthopedics;  Laterality: Right;  . Cataract extraction Right     early 2015  . Joint replacement    . Esophagogastroduodenoscopy (egd) with propofol N/A 06/11/2014    Procedure: ESOPHAGOGASTRODUODENOSCOPY (EGD) WITH PROPOFOL;  Surgeon: Inda Castle, MD;  [facility];  Service: Endoscopy;  Laterality: N/A;  . Bravo ph study N/A 06/11/2014    Procedure: BRAVO [place] STUDY;  Surgeon: Inda Castle, MD;  [facility];  Service: Endoscopy;  Laterality: N/A;  Venia Minks dilation  06/11/2014    Procedure: Venia Minks DILATION;  Surgeon: Inda Castle, MD;  [facility];  Service: Endoscopy;;  . Left and right heart catheterization with coronary angiogram N/A 08/29/2014    Procedure: LEFT AND RIGHT HEART CATHETERIZATION WITH CORONARY ANGIOGRAM;  Surgeon: Peter M Martinique, MD;  Location: [facility];  Service: Cardiovascular;  Laterality: N/A;    Family History  Problem Relation Age of Onset  . Diabetes Maternal Grandfather   . Dementia Mother   . Coronary artery disease Mother   .  Cancer Father     blood ? type  . Anuerysm Daughter     brain  . Colon cancer Neg Hx    Social History:  reports that she quit smoking about 36 years ago. Her smoking use included Cigarettes. She has a 45 pack-year smoking history. She has never used smokeless tobacco. She reports that she drinks about 4.2 oz of alcohol per week. She reports that she does not use illicit drugs.  Allergies:  Allergies  Allergen Reactions  . Hydrocodone Other (See Comments)    Too away all energy and made her stop eating food for short time  . Percocet [Oxycodone-Acetaminophen] Other (See Comments)    Too away all energy and made her stop eating  food for short time  . Mercurial Derivatives   . Eggs Or Egg-Derived Products Hives and Rash  . Penicillins Hives and Rash  . Quinine Derivatives Hives and Rash     (Not in a hospital admission)  Results for orders placed or performed during the hospital encounter of 10/06/14 (from the past 48 hour(s))  CBC with Differential     Status: Abnormal   Collection Time: 10/06/14  2:25 PM  Result Value Ref Range   WBC 5.8 4.0 - 10.5 K/uL   RBC 4.22 3.87 - 5.11 MIL/uL   Hemoglobin 11.8 (L) 12.0 - 15.0 g/dL   HCT 37.2 36.0 - 46.0 %   MCV 88.2 78.0 - 100.0 fL   MCH 28.0 26.0 - 34.0 pg   MCHC 31.7 30.0 - 36.0 g/dL   RDW 14.5 11.5 - 15.5 %   Platelets 218 150 - 400 K/uL   Neutrophils Relative % 65 43 - 77 %   Neutro Abs 3.8 1.7 - 7.7 K/uL   Lymphocytes Relative 26 12 - 46 %   Lymphs Abs 1.5 0.7 - 4.0 K/uL   Monocytes Relative 6 3 - 12 %   Monocytes Absolute 0.4 0.1 - 1.0 K/uL   Eosinophils Relative 2 0 - 5 %   Eosinophils Absolute 0.1 0.0 - 0.7 K/uL   Basophils Relative 1 0 - 1 %   Basophils Absolute 0.0 0.0 - 0.1 K/uL  Comprehensive metabolic panel     Status: Abnormal   Collection Time: 10/06/14  2:25 PM  Result Value Ref Range   Sodium 139 135 - 145 mmol/L   Potassium 4.2 3.5 - 5.1 mmol/L   Chloride 107 96 - 112 mmol/L   CO2 24 19 - 32 mmol/L   Glucose, Bld 169 (H) 70 - 99 mg/dL   BUN 22 6 - 23 mg/dL   Creatinine, Ser 1.35 (H) 0.50 - 1.10 mg/dL   Calcium 8.7 8.4 - 10.5 mg/dL   Total Protein 6.8 6.0 - 8.3 g/dL   Albumin 3.5 3.5 - 5.2 g/dL   AST 46 (H) 0 - 37 U/L   ALT 23 0 - 35 U/L   Alkaline Phosphatase 61 39 - 117 U/L   Total Bilirubin 0.8 0.3 - 1.2 mg/dL   GFR calc non Af Amer 35 (L) >90 mL/min   GFR calc Af Amer 41 (L) >90 mL/min    Comment: (NOTE) The eGFR has been calculated using the CKD EPI equation. This calculation has not been validated in all clinical situations. eGFR's persistently <90 mL/min signify possible Chronic Kidney Disease.    Anion gap 8 5 - 15    Troponin I     Status: Abnormal   Collection Time: 10/06/14  2:25 PM  Result  Value Ref Range   Troponin I 0.10 (H) <0.031 ng/mL    Comment:        PERSISTENTLY INCREASED TROPONIN VALUES IN THE RANGE OF 0.04-0.49 ng/mL CAN BE SEEN IN:       -UNSTABLE ANGINA       -CONGESTIVE HEART FAILURE       -MYOCARDITIS       -CHEST TRAUMA       -ARRYHTHMIAS       -LATE PRESENTING MYOCARDIAL INFARCTION       -COPD   CLINICAL FOLLOW-UP RECOMMENDED.   Magnesium     Status: None   Collection Time: 10/06/14  2:25 PM  Result Value Ref Range   Magnesium 2.2 1.5 - 2.5 mg/dL  CBG monitoring, ED     Status: Abnormal   Collection Time: 10/06/14  2:35 PM  Result Value Ref Range   Glucose-Capillary 154 (H) 70 - 99 mg/dL  Urinalysis, Routine w reflex microscopic     Status: Abnormal   Collection Time: 10/06/14  3:52 PM  Result Value Ref Range   Color, Urine AMBER (A) YELLOW    Comment: BIOCHEMICALS MAY BE AFFECTED BY COLOR   APPearance CLEAR CLEAR   Specific Gravity, Urine 1.020 1.005 - 1.030   pH 5.0 5.0 - 8.0   Glucose, UA NEGATIVE NEGATIVE mg/dL   Hgb urine dipstick SMALL (A) NEGATIVE   Bilirubin Urine SMALL (A) NEGATIVE   Ketones, ur 15 (A) NEGATIVE mg/dL   Protein, ur 30 (A) NEGATIVE mg/dL   Urobilinogen, UA 0.2 0.0 - 1.0 mg/dL   Nitrite NEGATIVE NEGATIVE   Leukocytes, UA LARGE (A) NEGATIVE  Urine microscopic-add on     Status: Abnormal   Collection Time: 10/06/14  3:52 PM  Result Value Ref Range   Squamous Epithelial / LPF RARE RARE   WBC, UA TOO NUMEROUS TO COUNT <3 WBC/hpf   RBC / HPF 7-10 <3 RBC/hpf   Bacteria, UA MANY (A) RARE   Urine-Other AMORPHOUS URATES/PHOSPHATES    Dg Chest Port 1 View  10/06/2014   CLINICAL DATA:  Initial encounter for dyspnea. Low heart rate. Short of breath and cough.  EXAM: PORTABLE CHEST - 1 VIEW  COMPARISON:  07/10/2014  FINDINGS: Right shoulder arthroplasty. Numerous leads and wires project over the chest. Mild right hemidiaphragm elevation. Midline  trachea. Mild cardiomegaly with a tortuous thoracic aorta. No right and no definite left pleural effusion; breast tissue projects over the left costophrenic angle. No pneumothorax. No congestive failure. Clear lungs.  IMPRESSION: Cardiomegaly without congestive failure.   Electronically Signed   By: Abigail Miyamoto M.D.   On: 10/06/2014 15:31    ROS  Blood pressure 89/55, pulse 34, temperature 97.5 F (36.4 C), resp. rate 15, height 5' 2.5" (1.588 m), weight 177 lb (80.287 kg), SpO2 100 %. Physical Exam  Well appearing elderly woman, NAD HEENT: Unremarkable,[place], AT Neck:  8 JVD, no thyromegally Back:  No CVA tenderness Lungs:  Clear with no wheezes, rales, or rhonchi HEART:  Regular bradycardic rhythm, no murmurs, no rubs, no clicks Abd:  soft, positive bowel sounds, no organomegally, no rebound, no guarding Ext:  2 plus pulses, no edema, no cyanosis, no clubbing Skin:  No rashes no nodules Neuro:  CN II through XII intact, motor grossly intact  EKG: Normal sinus rhythm with complete heart block  Assessment/Plan 1. Complete heart block 2. Chronic systolic heart failure, ejection fraction 35% 3. Hypertension Discussion: I discussed the treatment options with the patient. The risks,  goals, benefits, and expectations of biventricular pacemaker insertion have been reviewed with the patient. She wishes to proceed. This be scheduled as soon as her schedule allows. She will be maintained on her current home medications.  Cristopher Peru 10/06/2014, 5:00 PM

## 2014-10-06 NOTE — ED Notes (Signed)
Pt states she feels tired and SOB. Recent diagnosis of CHF. Pt thought it was her CBG today, however she was found to be in heart block HR 30's. MD at bedside

## 2014-10-06 NOTE — ED Notes (Signed)
Pt alert and oriented. Zoll at bedside for emergency.

## 2014-10-06 NOTE — ED Notes (Signed)
CBG 175 in triage

## 2014-10-06 NOTE — ED Provider Notes (Signed)
CSN: [id number]     Arrival date & time 10/06/14  1345 History   First MD Initiated Contact with Patient 10/06/14 1409     Chief Complaint  Patient presents with  . Shortness of Breath  . Bradycardia      HPI  Patient presents for evaluation with concern that her blood sugar may have been low.  She states that she was feeling a little lightheaded today. So she came here. Her blood sugar was 140 upon arrival. She states it was 300 at home.  Arrival here she noted to be bradycardic. EKG given to be from triage shows third-degree AV block. She is not on any AV no blocking agents. She has a history of known dilated cardiomyopathy. Nonischemic by cath. Falls Dr. Golden Hurter. Also history of pulmonary hypertension, and diastolic dysfunction.  Given a prescription for Corlander on Thursday, has not filled the prescription or taken a single dose. Noted heart rate of 83 in the radiology office on Thursday.  Past Medical History  Diagnosis Date  . Bowel obstruction   . Left tibial fracture 2007  . Asthma     Allergixc reaction to cats.  . Anemia     occassionally  . Arthritis     all over.  . Diabetes mellitus 2006    Diet and exercise controlled.  Marland Kitchen GERD (gastroesophageal reflux disease)     occ  . Osteoarthritis of right shoulder region 06/26/2013  . Shortness of breath     with exertion- started in MAY 2015, "dry cough,wheezing"  . DCM (dilated cardiomyopathy) 08/16/2014    normal coronary arteries on cath with EF 30-45%   . Chronic systolic CHF (congestive heart failure) 10/04/2014  . Chronic systolic CHF (congestive heart failure)     EF 30-35% by cath   Past Surgical History  Procedure Laterality Date  . Abdominal hysterectomy  1969  . Dilation and curettage of uterus  1968  . Pilonidal cyst excision  1959  . Corn removal  1999    Bilateral feet  . Bunionectomy  1984    Bilateral  . Tonsillectomy  1942  . Eye surgery  1983    Surgery to fix Retainal detachment, bilateral   . Carpal tunnell  2004    Bilateral  . Total hip arthroplasty  11/16/2011    Procedure: TOTAL HIP ARTHROPLASTY ANTERIOR APPROACH;  Surgeon: Hessie Dibble, MD;  Location: [place];  Service: Orthopedics;  Laterality: Right;  DEPUY  . Total shoulder arthroplasty Right 06/26/2013    Procedure: TOTAL SHOULDER ARTHROPLASTY;  Surgeon: Johnny Bridge, MD;  Location: [place];  Service: Orthopedics;  Laterality: Right;  . Cataract extraction Right     early 2015  . Joint replacement    . Esophagogastroduodenoscopy (egd) with propofol N/A 06/11/2014    Procedure: ESOPHAGOGASTRODUODENOSCOPY (EGD) WITH PROPOFOL;  Surgeon: Inda Castle, MD;  Location: [facility];  Service: Endoscopy;  Laterality: N/A;  . Bravo ph study N/A 06/11/2014    Procedure: BRAVO Rector STUDY;  Surgeon: Inda Castle, MD;  Location: [facility];  Service: Endoscopy;  Laterality: N/A;  Venia Minks dilation  06/11/2014    Procedure: Venia Minks DILATION;  Surgeon: Inda Castle, MD;  Location: [facility];  Service: Endoscopy;;  . Left and right heart catheterization with coronary angiogram N/A 08/29/2014    Procedure: LEFT AND RIGHT HEART CATHETERIZATION WITH CORONARY ANGIOGRAM;  Surgeon: Peter M Martinique, MD;  Location: [facility];  Service: Cardiovascular;  Laterality: N/A;  .  Bi-ventricular pacemaker insertion N/A 10/07/2014    Procedure: BI-VENTRICULAR PACEMAKER INSERTION (CRT-P);  Surgeon: Evans Lance, MD;  Location: [facility] CATH LAB;  Service: Cardiovascular;  Laterality: N/A;   Family History  Problem Relation Age of Onset  . Diabetes Maternal Grandfather   . Dementia Mother   . Coronary artery disease Mother   . Cancer Father     blood ? type  . Anuerysm Daughter     brain  . Colon cancer Neg Hx    History  Substance Use Topics  . Smoking status: Former Smoker -- 1.00 packs/day for 45 years    Types: Cigarettes    Quit date: 07/24/1978  . Smokeless tobacco: Never Used  . Alcohol Use: 4.2 oz/week    7 Glasses of  wine per week   OB History    No data available     Review of Systems  Constitutional: Negative for fever, chills, diaphoresis, appetite change and fatigue.  HENT: Negative for mouth sores, sore throat and trouble swallowing.   Eyes: Negative for visual disturbance.  Respiratory: Positive for shortness of breath. Negative for cough, chest tightness and wheezing.   Cardiovascular: Negative for chest pain and leg swelling.  Gastrointestinal: Negative for nausea, vomiting, abdominal pain, diarrhea and abdominal distention.  Endocrine: Negative for polydipsia, polyphagia and polyuria.  Genitourinary: Negative for dysuria, frequency and hematuria.  Musculoskeletal: Negative for gait problem.  Skin: Negative for color change, pallor and rash.  Neurological: Positive for dizziness, weakness and light-headedness. Negative for syncope and headaches.  Hematological: Does not bruise/bleed easily.  Psychiatric/Behavioral: Negative for behavioral problems and confusion.      Allergies  Hydrocodone; Percocet; Mercurial derivatives; Eggs or egg-derived products; Penicillins; and Quinine derivatives  Home Medications   Prior to Admission medications   Medication Sig Start Date End Date Taking? Authorizing Provider  aspirin EC 81 MG tablet Take 81 mg by mouth daily.   Yes Historical Provider, MD  bisacodyl (DULCOLAX) 5 MG EC tablet Take 5 mg by mouth daily as needed for mild constipation or moderate constipation.   Yes Historical Provider, MD  meloxicam (MOBIC) 15 MG tablet Take 15 mg by mouth daily. 10/02/14  Yes Historical Provider, MD  polyethylene glycol (MIRALAX / GLYCOLAX) packet Take 17 g by mouth daily.   Yes Historical Provider, MD  furosemide (LASIX) 40 MG tablet Take 1 tablet (40 mg total) by mouth daily. 09/13/14   Sueanne Margarita, MD  ivabradine (CORLANOR) 5 MG TABS tablet Take 1 tablet (5 mg total) by mouth 2 (two) times daily with a meal. 10/04/14   Sueanne Margarita, MD  lisinopril  (PRINIVIL,ZESTRIL) 5 MG tablet Take 1 tablet (5 mg total) by mouth daily. 08/16/14   Sueanne Margarita, MD  spironolactone (ALDACTONE) 25 MG tablet Take 0.5 tablets (12.5 mg total) by mouth daily. 10/04/14   Sueanne Margarita, MD   BP 150/74 mmHg  Pulse 68  Temp(Src) 98.4 F (36.9 C) (Oral)  Resp 20  Ht 5' 2.5" (1.588 m)  Wt 177 lb 14.6 oz (80.7 kg)  BMI 32.00 kg/m2  SpO2 93% Physical Exam  Constitutional: She is oriented to person, place, and time. She appears well-developed and well-nourished. No distress.  HENT:  Head: Normocephalic.  Eyes: Conjunctivae are normal. Pupils are equal, round, and reactive to light. No scleral icterus.  Neck: Normal range of motion. Neck supple. No thyromegaly present.  Cardiovascular: An irregular rhythm present. Bradycardia present.  Exam reveals no gallop and no friction  rub.   No murmur heard. No S3 or 4 gallop. Irregularly irregular pulse. Third-degree AV block on monitor, and by EKG.  Pulmonary/Chest: Effort normal and breath sounds normal. No respiratory distress. She has no wheezes. She has no rales.  Abdominal: Soft. Bowel sounds are normal. She exhibits no distension. There is no tenderness. There is no rebound.  Musculoskeletal: Normal range of motion.  Neurological: She is alert and oriented to person, place, and time.  Skin: Skin is warm and dry. No rash noted.  Psychiatric: She has a normal mood and affect. Her behavior is normal.    ED Course  Procedures (including critical care time) Labs Review Labs Reviewed  CBC WITH DIFFERENTIAL/PLATELET - Abnormal; Notable for the following:    Hemoglobin 11.8 (*)    All other components within normal limits  COMPREHENSIVE METABOLIC PANEL - Abnormal; Notable for the following:    Glucose, Bld 169 (*)    Creatinine, Ser 1.35 (*)    AST 46 (*)    GFR calc non Af Amer 35 (*)    GFR calc Af Amer 41 (*)    All other components within normal limits  URINALYSIS, ROUTINE W REFLEX MICROSCOPIC - Abnormal;  Notable for the following:    Color, Urine AMBER (*)    Hgb urine dipstick SMALL (*)    Bilirubin Urine SMALL (*)    Ketones, ur 15 (*)    Protein, ur 30 (*)    Leukocytes, UA LARGE (*)    All other components within normal limits  TROPONIN I - Abnormal; Notable for the following:    Troponin I 0.10 (*)    All other components within normal limits  URINE MICROSCOPIC-ADD ON - Abnormal; Notable for the following:    Bacteria, UA MANY (*)    All other components within normal limits  BASIC METABOLIC PANEL - Abnormal; Notable for the following:    Glucose, Bld 148 (*)    BUN 26 (*)    Creatinine, Ser 1.46 (*)    GFR calc non Af Amer 32 (*)    GFR calc Af Amer 37 (*)    All other components within normal limits  CBC - Abnormal; Notable for the following:    Hemoglobin 11.6 (*)    All other components within normal limits  GLUCOSE, CAPILLARY - Abnormal; Notable for the following:    Glucose-Capillary 149 (*)    All other components within normal limits  GLUCOSE, CAPILLARY - Abnormal; Notable for the following:    Glucose-Capillary 175 (*)    All other components within normal limits  CBG MONITORING, ED - Abnormal; Notable for the following:    Glucose-Capillary 154 (*)    All other components within normal limits  MRSA PCR SCREENING  MAGNESIUM  GLUCOSE, CAPILLARY  GLUCOSE, CAPILLARY  GLUCOSE, CAPILLARY  GLUCOSE, CAPILLARY  GLUCOSE, CAPILLARY    Imaging Review Dg Chest Port 1 View  10/06/2014   CLINICAL DATA:  Initial encounter for dyspnea. Low heart rate. Short of breath and cough.  EXAM: PORTABLE CHEST - 1 VIEW  COMPARISON:  07/10/2014  FINDINGS: Right shoulder arthroplasty. Numerous leads and wires project over the chest. Mild right hemidiaphragm elevation. Midline trachea. Mild cardiomegaly with a tortuous thoracic aorta. No right and no definite left pleural effusion; breast tissue projects over the left costophrenic angle. No pneumothorax. No congestive failure. Clear lungs.   IMPRESSION: Cardiomegaly without congestive failure.   Electronically Signed   By: Abigail Miyamoto M.D.   On:  10/06/2014 15:31     EKG Interpretation   Date/Time:  Sunday October 06 2014 14:12:54 EST Ventricular Rate:  36 PR Interval:  52 QRS Duration: 140 QT Interval:  681 QTC Calculation: 527 R Axis:   174 Text Interpretation:  Complete (3-degree) AV block Right bundle branch  block Lateral infarct, age indeterminate Confirmed by Jeneen Rinks  MD, [place]  [phone]) on 10/06/2014 4:23:44 PM      MDM   Final diagnoses:  Dyspnea    Patient remains symptomatic. However remains bradycardic. Pressure of 95. Clinically and radiographically not in congestive heart failure. Mentating well. I discussed case with Dr. Crissie Sickles. He will see the patient emergency room.    Tanna Furry, MD 10/08/14 [phone]

## 2014-10-06 NOTE — ED Notes (Signed)
Pt presents to department for evaluation of hyperglycemia. States elevated CBG of 300 at home this morning. Pt denies pain. She is alert and oriented x4.

## 2014-10-07 ENCOUNTER — Encounter (HOSPITAL_COMMUNITY)
Admission: EM | Disposition: A | Discharge status: Home / Self Care | Payer: Self-pay | Source: Home / Self Care | Attending: Internal Medicine

## 2014-10-07 ENCOUNTER — Encounter (HOSPITAL_COMMUNITY): Payer: Self-pay | Admitting: Internal Medicine

## 2014-10-07 DIAGNOSIS — I442 Atrioventricular block, complete: Secondary | ICD-10-CM

## 2014-10-07 DIAGNOSIS — I5022 Chronic systolic (congestive) heart failure: Secondary | ICD-10-CM

## 2014-10-07 HISTORY — PX: BI-VENTRICULAR PACEMAKER INSERTION: SHX5462

## 2014-10-07 LAB — BASIC METABOLIC PANEL
Anion gap: 8 (ref 5–15)
BUN: 26 mg/dL — ABNORMAL HIGH (ref 6–23)
CHLORIDE: 107 mmol/L (ref 96–112)
CO2: 21 mmol/L (ref 19–32)
Calcium: 8.5 mg/dL (ref 8.4–10.5)
Creatinine, Ser: 1.46 mg/dL — ABNORMAL HIGH (ref 0.50–1.10)
GFR calc non Af Amer: 32 mL/min — ABNORMAL LOW (ref >90)
GFR, EST AFRICAN AMERICAN: 37 mL/min — AB (ref >90)
GLUCOSE: 148 mg/dL — AB (ref 70–99)
POTASSIUM: 4.9 mmol/L (ref 3.5–5.1)
Sodium: 136 mmol/L (ref 135–145)

## 2014-10-07 LAB — CBC
HEMATOCRIT: 37.5 % (ref 36.0–46.0)
Hemoglobin: 11.6 g/dL — ABNORMAL LOW (ref 12.0–15.0)
MCH: 27.8 pg (ref 26.0–34.0)
MCHC: 30.9 g/dL (ref 30.0–36.0)
MCV: 89.9 fL (ref 78.0–100.0)
Platelets: 196 10*3/uL (ref 150–400)
RBC: 4.17 MIL/uL (ref 3.87–5.11)
RDW: 14.7 % (ref 11.5–15.5)
WBC: 5.9 10*3/uL (ref 4.0–10.5)

## 2014-10-07 LAB — GLUCOSE, CAPILLARY: Glucose-Capillary: 175 mg/dL — ABNORMAL HIGH (ref 70–99)

## 2014-10-07 SURGERY — BI-VENTRICULAR PACEMAKER INSERTION (CRT-P)
Anesthesia: LOCAL

## 2014-10-07 MED ORDER — LIDOCAINE HCL (PF) 1 % IJ SOLN
INTRAMUSCULAR | Status: AC
  Filled 2014-10-07: qty 30

## 2014-10-07 MED ORDER — FUROSEMIDE 40 MG PO TABS
40.0000 mg | ORAL_TABLET | Freq: Every day | ORAL | Status: DC
  Administered 2014-10-08: 10:00:00 40 mg via ORAL
  Filled 2014-10-07: qty 1

## 2014-10-07 MED ORDER — MIDAZOLAM HCL 5 MG/5ML IJ SOLN
INTRAMUSCULAR | Status: AC
  Filled 2014-10-07: qty 5

## 2014-10-07 MED ORDER — LISINOPRIL 5 MG PO TABS
5.0000 mg | ORAL_TABLET | Freq: Every day | ORAL | Status: DC
  Administered 2014-10-08: 10:00:00 5 mg via ORAL
  Filled 2014-10-07: qty 1

## 2014-10-07 MED ORDER — SPIRONOLACTONE 12.5 MG HALF TABLET
12.5000 mg | ORAL_TABLET | Freq: Every day | ORAL | Status: DC
  Administered 2014-10-08: 10:00:00 12.5 mg via ORAL
  Filled 2014-10-07: qty 1

## 2014-10-07 MED ORDER — VANCOMYCIN HCL IN DEXTROSE 1-5 GM/200ML-% IV SOLN
1000.0000 mg | Freq: Two times a day (BID) | INTRAVENOUS | Status: AC
  Administered 2014-10-08: 06:00:00 1000 mg via INTRAVENOUS
  Filled 2014-10-07: qty 200

## 2014-10-07 MED ORDER — CHLORHEXIDINE GLUCONATE 4 % EX LIQD
CUTANEOUS | Status: DC
  Filled 2014-10-07: qty 15

## 2014-10-07 MED ORDER — SODIUM CHLORIDE 0.9 % IV SOLN: INTRAVENOUS | Status: DC

## 2014-10-07 MED ORDER — HEPARIN (PORCINE) IN NACL 2-0.9 UNIT/ML-% IJ SOLN
INTRAMUSCULAR | Status: AC
  Filled 2014-10-07: qty 500

## 2014-10-07 MED ORDER — VANCOMYCIN HCL IN DEXTROSE 1-5 GM/200ML-% IV SOLN
1000.0000 mg | INTRAVENOUS | Status: DC
  Filled 2014-10-07: qty 200

## 2014-10-07 MED ORDER — ASPIRIN EC 81 MG PO TBEC: 81.0000 mg | DELAYED_RELEASE_TABLET | Freq: Every day | ORAL | Status: DC

## 2014-10-07 MED ORDER — ONDANSETRON HCL 4 MG/2ML IJ SOLN: 4.0000 mg | Freq: Four times a day (QID) | INTRAMUSCULAR | Status: DC | PRN

## 2014-10-07 MED ORDER — SODIUM CHLORIDE 0.9 % IR SOLN
80.0000 mg | Status: DC
  Filled 2014-10-07: qty 2

## 2014-10-07 MED ORDER — FENTANYL CITRATE 0.05 MG/ML IJ SOLN
INTRAMUSCULAR | Status: AC
  Filled 2014-10-07: qty 2

## 2014-10-07 NOTE — Progress Notes (Signed)
EKG CRITICAL VALUE     12 lead EKG performed.  Critical value noted.  Warden Fillers, RN notified.   Asencion Gowda, CCT 10/07/2014 7:53 AM

## 2014-10-07 NOTE — Progress Notes (Signed)
Patient ID: Kim Mullins, female   DOB: Mar 14, 1930, 81 y.o.   MRN: [id number]    Patient Name: Kim Mullins Date of Encounter: 10/07/2014     Active Problems:   Complete heart block    SUBJECTIVE  No chest pain. Still some dyspnea.   CURRENT MEDS . aspirin EC  81 mg Oral Daily  . chlorhexidine   Topical STAT  . gentamicin irrigation  80 mg Irrigation On Call  . sodium chloride  3 mL Intravenous Q12H  . vancomycin  1,000 mg Intravenous On Call    OBJECTIVE  Filed Vitals:   10/07/14 1115 10/07/14 1130 10/07/14 1200 10/07/14 1241  BP: 90/58 114/87 120/63   Pulse: 39 77 39   Temp:    98.2 F (36.8 C)  TempSrc:    Oral  Resp: 14 18 18    Height:      Weight:      SpO2: 98% 94% 97%     Intake/Output Summary (Last 24 hours) at 10/07/14 1555 Last data filed at 10/07/14 1200  Gross per 24 hour  Intake    720 ml  Output    350 ml  Net    370 ml   Filed Weights   10/06/14 1353  Weight: 177 lb (80.287 kg)    PHYSICAL EXAM  General: Pleasant, NAD. Neuro: Alert and oriented X 3. Moves all extremities spontaneously. Psych: Normal affect. HEENT:  Normal  Neck: Supple without bruits or JVD. Lungs:  Resp regular and unlabored, CTA. Heart: RRR no s3, s4, or murmurs. Abdomen: Soft, non-tender, non-distended, BS + x 4.  Extremities: No clubbing, cyanosis or edema. DP/PT/Radials 2+ and equal bilaterally.  Accessory Clinical Findings  CBC  Recent Labs  10/06/14 1425 10/07/14 0245  WBC 5.8 5.9  NEUTROABS 3.8  --   HGB 11.8* 11.6*  HCT 37.2 37.5  MCV 88.2 89.9  PLT 218 123456   Basic Metabolic Panel  Recent Labs  10/06/14 1425 10/07/14 0245  NA 139 136  K 4.2 4.9  CL 107 107  CO2 24 21  GLUCOSE 169* 148*  BUN 22 26*  CREATININE 1.35* 1.46*  CALCIUM 8.7 8.5  MG 2.2  --    Liver Function Tests  Recent Labs  10/06/14 1425  AST 46*  ALT 23  ALKPHOS 61  BILITOT 0.8  PROT 6.8  ALBUMIN 3.5   No results for input(s): LIPASE, AMYLASE in  the last 72 hours. Cardiac Enzymes  Recent Labs  10/06/14 1425  TROPONINI 0.10*   BNP Invalid input(s): POCBNP D-Dimer No results for input(s): DDIMER in the last 72 hours. Hemoglobin A1C No results for input(s): HGBA1C in the last 72 hours. Fasting Lipid Panel No results for input(s): CHOL, HDL, LDLCALC, TRIG, CHOLHDL, LDLDIRECT in the last 72 hours. Thyroid Function Tests No results for input(s): TSH, T4TOTAL, T3FREE, THYROIDAB in the last 72 hours.  Invalid input(s): FREET3  TELE NSR  Radiology/Studies  Dg Chest Port 1 View  10/06/2014   CLINICAL DATA:  Initial encounter for dyspnea. Low heart rate. Short of breath and cough.  EXAM: PORTABLE CHEST - 1 VIEW  COMPARISON:  07/10/2014  FINDINGS: Right shoulder arthroplasty. Numerous leads and wires project over the chest. Mild right hemidiaphragm elevation. Midline trachea. Mild cardiomegaly with a tortuous thoracic aorta. No right and no definite left pleural effusion; breast tissue projects over the left costophrenic angle. No pneumothorax. No congestive failure. Clear lungs.  IMPRESSION: Cardiomegaly without congestive failure.   Electronically Signed  By: Abigail Miyamoto M.D.   On: 10/06/2014 15:31    ASSESSMENT AND PLAN  1. Complete heart block 2. Non-ischemic CM 3. Chronic systolic heart failure Plan - as her complete AV block persists, I have recommended proceeding with BiV PM insertion. The risks/benefits/goals/expectations of the procedure have been discussed with the patient and she wishes to proceed.  Candela Krul,M.D.  10/07/2014 3:55 PM

## 2014-10-07 NOTE — CV Procedure (Signed)
SURGEON: Cristopher Peru, MD   PREPROCEDURE DIAGNOSIS: Symptomatic complete AV block, ischemic CM (EF 35%), NYHA Class II CHF   POSTPROCEDURE DIAGNOSIS: Symptomatic complete AV block, ischemic CM (EF 35%), NYHA Class II CHF   PROCEDURES:  1. Coronary sinus venography.  2. Biventricular Pacemaker implantation.   INTRODUCTION: Kim Mullins is a 81 y.o. female with a history of symptomatic complete AV block with EF 35% who presents today for pacemaker implantation. The patient reports intermittent episodes of dizziness over the past few days. No reversible causes have been identified. She is expected to V paced > 90% and has an EF of 35%. The patient therefore presents today for biventricular pacemaker implantation.   DESCRIPTION OF PROCEDURE: Informed written consent was obtained, and the patient was brought to the electrophysiology lab in a fasting state. The patient required no sedation for the procedure today. The patients left chest was prepped and draped in the usual sterile fashion by the EP lab staff. The skin overlying the left deltopectoral region was infiltrated with lidocaine for local analgesia. A 5-cm incision was made over the left deltopectoral region. A left subcutaneous pacemaker pocket was fashioned using a combination of sharp and blunt dissection. Electrocautery was required to assure hemostasis.   RA/RV Lead Placement:  The left axillary vein was cannulated. Through the left axillary vein, a Medtronic model 5076 (serial number [id number]) right atrial lead and a Medtronic model 5076 (serial number [id number]) right ventricular lead were advanced with fluoroscopic visualization into the right atrial appendage and right ventricular apical septal positions respectively. Initial atrial lead P- waves measured 1.5 mV with impedance of 670 ohms and a threshold of 1.1 V at 0.5 msec. Right ventricular lead R-waves measured 3.5 (low throughout the RV mV with an impedance of 1200  ohms and a threshold of 0.4 V at 0.5 msec. Both leads were secured to the pectoralis fascia using #2-0 silk over the suture sleeves.   LV Lead Placement:  A Medtronic MB 2 guide was advanced through the left axillary vein into the low lateral right atrium. A 6 french hexapolar EP catheter was introduced through the Medtronic guide and used to cannulate the coronary sinus. A coronary sinus nonselective venogram was performed by hand injection of nonionic contrast. This demonstrated a satisfactory posterior vein.  A 0.014 angioplasty guide wire was introduced through the Medtronic Guide and advanced into the posterior vein. A Medtronic (serial number [id number]) lead was advanced through the posterior vein into the distal lateral branch. This was approximately two-thirds from the base to the apex in a very lateral  position. In this location with bipolar configuration, the left ventricular lead R-waves measured 6 mV with impedance of 1000 ohms and a threshold of 1.2 volt at 0.5 Milliseconds with no diaphragmatic stimulation observed when pacing at 10 volts output. The Medtronic guide was therefore removed.  All three leads were secured to the pectoralis fascia using #2 silk suture over the suture sleeves. The pocket then irrigated with copious gentamicin solution.   Device Placement:  The leads were then connected to the Medtronic (serial number [id number]) pacemaker. The pacemaker was then placed into the pocket. The pocket was then closed in 2 layers with 2.0 Vicryl suture for the subcutaneous and subcuticular layers. Steri- strips and a sterile dressing were then applied.  There were no early apparent complications.   CONCLUSIONS:  1. Successful implantation of a Medtronic BiV pacemaker for symptomatic complete AV block  2. No early apparent  complications.   Cristopher Peru, MD  5:45 PM 10/07/2014

## 2014-10-07 NOTE — Progress Notes (Signed)
Utilization Review Completed.  

## 2014-10-08 ENCOUNTER — Inpatient Hospital Stay (HOSPITAL_COMMUNITY): Payer: Medicare Other

## 2014-10-08 DIAGNOSIS — Z950 Presence of cardiac pacemaker: Secondary | ICD-10-CM

## 2014-10-08 LAB — GLUCOSE, CAPILLARY
GLUCOSE-CAPILLARY: 106 mg/dL — AB (ref 70–99)
GLUCOSE-CAPILLARY: 130 mg/dL — AB (ref 70–99)
Glucose-Capillary: 111 mg/dL — ABNORMAL HIGH (ref 70–99)

## 2014-10-08 MED ORDER — CARVEDILOL 3.125 MG PO TABS: 3.1250 mg | ORAL_TABLET | Freq: Two times a day (BID) | ORAL | Status: DC

## 2014-10-08 MED ORDER — ACETAMINOPHEN 325 MG PO TABS: 650.0000 mg | ORAL_TABLET | Freq: Four times a day (QID) | ORAL | Status: DC | PRN

## 2014-10-08 MED ORDER — ACETAMINOPHEN 325 MG PO TABS
650.0000 mg | ORAL_TABLET | Freq: Four times a day (QID) | ORAL | Status: DC | PRN
  Administered 2014-10-08: 11:00:00 650 mg via ORAL
  Filled 2014-10-08: qty 2

## 2014-10-08 MED ORDER — YOU HAVE A PACEMAKER BOOK
Freq: Once | Status: AC
  Administered 2014-10-08: 03:00:00
  Filled 2014-10-08: qty 1

## 2014-10-08 NOTE — Progress Notes (Addendum)
Patient ID: Kim Mullins, female   DOB: July 21, 1930, 81 y.o.   MRN: [id number]    Patient Name: Kim Mullins Date of Encounter: 10/08/2014     Active Problems:   Complete heart block    SUBJECTIVE  Minimal incisional soreness. No chest pain or sob.  CURRENT MEDS . aspirin EC  81 mg Oral Daily  . chlorhexidine   Topical STAT  . furosemide  40 mg Oral Daily  . lisinopril  5 mg Oral Daily  . sodium chloride  3 mL Intravenous Q12H  . spironolactone  12.5 mg Oral Daily    OBJECTIVE  Filed Vitals:   10/07/14 2100 10/07/14 2200 10/08/14 0015 10/08/14 0656  BP: 123/87 138/82 142/93 150/74  Pulse: 75 79 77 68  Temp:   98.3 F (36.8 C) 98.4 F (36.9 C)  TempSrc:   Oral Oral  Resp:   18 20  Height:      Weight:   177 lb 14.6 oz (80.7 kg)   SpO2: 96% 93% 98% 93%    Intake/Output Summary (Last 24 hours) at 10/08/14 0713 Last data filed at 10/07/14 2100  Gross per 24 hour  Intake    600 ml  Output    350 ml  Net    250 ml   Filed Weights   10/06/14 1353 10/08/14 0015  Weight: 177 lb (80.287 kg) 177 lb 14.6 oz (80.7 kg)    PHYSICAL EXAM  General: Pleasant, NAD. Neuro: Alert and oriented X 3. Moves all extremities spontaneously. Psych: Normal affect. HEENT:  Normal  Neck: Supple without bruits or JVD. Lungs:  Resp regular and unlabored, CTA. No hematoma Heart: RRR no s3, s4, or murmurs. Abdomen: Soft, non-tender, non-distended, BS + x 4.  Extremities: No clubbing, cyanosis or edema. DP/PT/Radials 2+ and equal bilaterally.  Accessory Clinical Findings  CBC  Recent Labs  10/06/14 1425 10/07/14 0245  WBC 5.8 5.9  NEUTROABS 3.8  --   HGB 11.8* 11.6*  HCT 37.2 37.5  MCV 88.2 89.9  PLT 218 123456   Basic Metabolic Panel  Recent Labs  10/06/14 1425 10/07/14 0245  NA 139 136  K 4.2 4.9  CL 107 107  CO2 24 21  GLUCOSE 169* 148*  BUN 22 26*  CREATININE 1.35* 1.46*  CALCIUM 8.7 8.5  MG 2.2  --    Liver Function Tests  Recent Labs  10/06/14 1425  AST 46*  ALT 23  ALKPHOS 61  BILITOT 0.8  PROT 6.8  ALBUMIN 3.5   No results for input(s): LIPASE, AMYLASE in the last 72 hours. Cardiac Enzymes  Recent Labs  10/06/14 1425  TROPONINI 0.10*   BNP Invalid input(s): POCBNP D-Dimer No results for input(s): DDIMER in the last 72 hours. Hemoglobin A1C No results for input(s): HGBA1C in the last 72 hours. Fasting Lipid Panel No results for input(s): CHOL, HDL, LDLCALC, TRIG, CHOLHDL, LDLDIRECT in the last 72 hours. Thyroid Function Tests No results for input(s): TSH, T4TOTAL, T3FREE, THYROIDAB in the last 72 hours.  Invalid input(s): FREET3  TELE NSR with biv pacing  Radiology/Studies  Dg Chest Port 1 View  10/06/2014   CLINICAL DATA:  Initial encounter for dyspnea. Low heart rate. Short of breath and cough.  EXAM: PORTABLE CHEST - 1 VIEW  COMPARISON:  07/10/2014  FINDINGS: Right shoulder arthroplasty. Numerous leads and wires project over the chest. Mild right hemidiaphragm elevation. Midline trachea. Mild cardiomegaly with a tortuous thoracic aorta. No right and no definite left pleural  effusion; breast tissue projects over the left costophrenic angle. No pneumothorax. No congestive failure. Clear lungs.  IMPRESSION: Cardiomegaly without congestive failure.   Electronically Signed   By: Abigail Miyamoto M.D.   On: 10/06/2014 15:31    ASSESSMENT AND PLAN  1. Complete heart block 2. Chronic systolic heart failure 3. S/p Biv PPM, with all leads working well. Rec: ok for discharge with usual followup. She will need to restart her home meds (she had not yet taken corlanor and I will defer whether she needs this or not to Dr. Radford Pax. She does need to start coreg 3.125 mg twice daily. Uptitration as an outpatient.   [name],M.D.  10/08/2014 7:13 AM

## 2014-10-08 NOTE — Discharge Instructions (Signed)
° ° °  Supplemental Discharge Instructions for  Pacemaker/Defibrillator Patients  Activity No heavy lifting or vigorous activity with your left/right arm for 6 to 8 weeks.  Do not raise your left/right arm above your head for one week.  Gradually raise your affected arm as drawn below.           __2/11                        2/12                          2/13                          2/14  NO DRIVING until cleared by cardiologist  WOUND CARE - Keep the wound area clean and dry.  Do not get this area wet for one week. No showers for one week; you may shower on  10/15/2014   . - The tape/steri-strips on your wound will fall off; do not pull them off.  No bandage is needed on the site.  DO  NOT apply any creams, oils, or ointments to the wound area. - If you notice any drainage or discharge from the wound, any swelling or bruising at the site, or you develop a fever > 101? F after you are discharged home, call the office at once.  Special Instructions - You are still able to use cellular telephones; use the ear opposite the side where you have your pacemaker/defibrillator.  Avoid carrying your cellular phone near your device. - When traveling through airports, show security personnel your identification card to avoid being screened in the metal detectors.  Ask the security personnel to use the hand wand. - Avoid arc welding equipment, MRI testing (magnetic resonance imaging), TENS units (transcutaneous nerve stimulators).  Call the office for questions about other devices. - Avoid electrical appliances that are in poor condition or are not properly grounded. - Microwave ovens are safe to be near or to operate.  Additional information for defibrillator patients should your device go off: - If your device goes off ONCE and you feel fine afterward, notify the device clinic nurses. - If your device goes off ONCE and you do not feel well afterward, call 911. - If your device goes off TWICE, call  911. - If your device goes off THREE times in one day, call 911.  DO NOT DRIVE YOURSELF OR A FAMILY MEMBER WITH A DEFIBRILLATOR TO THE HOSPITAL--CALL 911.

## 2014-10-08 NOTE — Discharge Summary (Signed)
ELECTROPHYSIOLOGY PROCEDURE DISCHARGE SUMMARY    Patient ID: Kim Mullins,  MRN: [id number], DOB/AGE: 05-24-1930 81 y.o.  Admit date: 10/06/2014 Discharge date: 10/08/2014  Primary Care Physician: Kristine Garbe, MD Primary Cardiologist: Radford Pax Electrophysiologist: Lovena Le  Primary Discharge Diagnosis:  Complete heart block status post pacemaker implantation this admission  Secondary Discharge Diagnosis:  1.  Hypertension 2.  Diabetes 3.  Dilated cardiomyopathy 4.  GERD 5.  Arthritis   Allergies  Allergen Reactions  . Hydrocodone Other (See Comments)    Too away all energy and made her stop eating food for short time  . Percocet [Oxycodone-Acetaminophen] Other (See Comments)    Too away all energy and made her stop eating food for short time  . Mercurial Derivatives   . Eggs Or Egg-Derived Products Hives and Rash  . Penicillins Hives and Rash  . Quinine Derivatives Hives and Rash     Procedures This Admission:  1.  Implantation of a MDT CRTP on 10-07-14 by Dr Lovena Le.  See op note for full details There were no immediate post procedure complications. 2.  CXR on 10-08-14 demonstrated no pneumothorax status post device implantation.   Brief HPI/Hospital Course:  Kim Mullins is a 81 y.o. female with a past medical history as outlined above.  She presented to the ER 10-06-14 with complaints of dizziness and fatigue.  She was found to be in complete heart block.  She has known cardiomyopathy and was on no AV nodal blocking agents.  Risks, benefits, and alternatives to CRTP implantation were reviewed with the patient who wished to proceed.   The patient underwent implantation of a MDT CRTP with details as outlined above.  She  was monitored on telemetry overnight which demonstrated AV pacing.  Left chest was without hematoma or ecchymosis.  The device was interrogated and found to be functioning normally.  CXR was obtained and demonstrated no pneumothorax status post  device implantation.  Wound care, arm mobility, and restrictions were reviewed with the patient.  Dr Lovena Le examined the patient and considered them stable for discharge to home.   Coreg 3.125mg  will be started at discharge and can be uptitrated as an outpatient.   Discharge Vitals: Blood pressure 113/91, pulse 68, temperature 98.8 F (37.1 C), temperature source Oral, resp. rate 18, height 5' 2.5" (1.588 m), weight 177 lb 14.6 oz (80.7 kg), SpO2 93 %.    Labs:   Lab Results  Component Value Date   WBC 5.9 10/07/2014   HGB 11.6* 10/07/2014   HCT 37.5 10/07/2014   MCV 89.9 10/07/2014   PLT 196 10/07/2014     Recent Labs Lab 10/06/14 1425 10/07/14 0245  NA 139 136  K 4.2 4.9  CL 107 107  CO2 24 21  BUN 22 26*  CREATININE 1.35* 1.46*  CALCIUM 8.7 8.5  PROT 6.8  --   BILITOT 0.8  --   ALKPHOS 61  --   ALT 23  --   AST 46*  --   GLUCOSE 169* 148*    Discharge Medications:    Medication List    TAKE these medications        acetaminophen 325 MG tablet  Commonly known as:  TYLENOL  Take 2 tablets (650 mg total) by mouth every 6 (six) hours as needed for moderate pain.     aspirin EC 81 MG tablet  Take 81 mg by mouth daily.     bisacodyl 5 MG EC tablet  Commonly known  as:  DULCOLAX  Take 5 mg by mouth daily as needed for mild constipation or moderate constipation.     carvedilol 3.125 MG tablet  Commonly known as:  COREG  Take 1 tablet (3.125 mg total) by mouth 2 (two) times daily with a meal.  Notes to Patient:  NEW MEDICINE     furosemide 40 MG tablet  Commonly known as:  LASIX  Take 1 tablet (40 mg total) by mouth daily.     ivabradine 5 MG Tabs tablet  Commonly known as:  CORLANOR  Take 1 tablet (5 mg total) by mouth 2 (two) times daily with a meal.     lisinopril 5 MG tablet  Commonly known as:  PRINIVIL,ZESTRIL  Take 1 tablet (5 mg total) by mouth daily.     meloxicam 15 MG tablet  Commonly known as:  MOBIC  Take 15 mg by mouth daily.      polyethylene glycol packet  Commonly known as:  MIRALAX / GLYCOLAX  Take 17 g by mouth daily.     spironolactone 25 MG tablet  Commonly known as:  ALDACTONE  Take 0.5 tablets (12.5 mg total) by mouth daily.        Disposition:   Follow-up Information    Follow up with Melina Copa, PA-C On 10/18/2014.   Specialty:  Cardiology   Why:  11:15am   Contact information:   [address] [place] [address] [phone]       Follow up with [facility] ECHO [facility] On 12/06/2014.   Specialty:  Cardiology   Why:  8:30am   Contact information:   [id number] [place] [phone]      Follow up with Cristopher Peru, MD On 01/07/2015.   Specialty:  Cardiology   Why:  10:00am. Pacemaker check   Contact information:   [address] [place] [address] [phone]       Follow up with Sueanne Margarita, MD On 01/07/2015.   Specialty:  Cardiology   Why:  2:45pm. 3 month followup   Contact information:   [id number] [place] [address] [phone]       Duration of Discharge Encounter: less than 30 minutes including physician time.  Signed,   Cristopher Peru, M.D.

## 2014-10-09 ENCOUNTER — Telehealth: Payer: Self-pay | Admitting: Cardiology

## 2014-10-09 ENCOUNTER — Telehealth: Payer: Self-pay | Admitting: Internal Medicine

## 2014-10-09 DIAGNOSIS — R0600 Dyspnea, unspecified: Secondary | ICD-10-CM

## 2014-10-09 LAB — GLUCOSE, CAPILLARY
GLUCOSE-CAPILLARY: 122 mg/dL — AB (ref 70–99)
GLUCOSE-CAPILLARY: 127 mg/dL — AB (ref 70–99)

## 2014-10-09 NOTE — Telephone Encounter (Signed)
The colanor can cause a slow HR but usually in patient's who already have conduction system disease

## 2014-10-09 NOTE — Telephone Encounter (Signed)
Patient st her BS was elevated to 500 on Sunday so she went to the hospital. Upon arrival, she st her HR was 40. On Monday, Dr. Lovena Le placed a BiV pacemaker. Pt wants to know a few things: 1) a pharmacist informed her that lasix and aldactone should not be taken at the same time. 2) pt concerned corlanor caused low pulse rate - she wants another medication  3) pt does not want to double up on Lasix (per 2/5 BNP instructions) until Dr. Radford Pax reviews her medications 3) pt just wants Dr. Radford Pax to take another look to see if all the medications are appropriate given new pacemaker  To Dr. Radford Pax for recommendations.

## 2014-10-09 NOTE — Telephone Encounter (Signed)
New Msg        Pt would like to know when can she drive and when can she remove the sling.  Pt recently had procedure completed with Dr. Lovena Le.  Please return pt call.

## 2014-10-09 NOTE — Telephone Encounter (Signed)
New Msg         Pt calling, states she has several questions about medications and would like to speak with nurse.    Please return call.

## 2014-10-09 NOTE — Telephone Encounter (Signed)
Per Dr. Radford Pax, patient is to [place] and continue other medications.  BNP ordered to be added onto already scheduled labs.   Left this in message on patient's private VM. Will F/U tomorrow.

## 2014-10-10 NOTE — Telephone Encounter (Signed)
Updated pt it's okay to remove her sling. She understands not to lift >5lbs until her wound check appointment. Pt continues to follow instructions for post-surgical arm movement. Pt aware she is now allowed to drive.   Pt aware ROV w/ device clinic 10/21/14.

## 2014-10-10 NOTE — Telephone Encounter (Signed)
Confirmed with patient she has STOPPED Corlanor. Verified lab appointment tomorrow. Patient agrees with treatment plan.

## 2014-10-11 ENCOUNTER — Other Ambulatory Visit (INDEPENDENT_AMBULATORY_CARE_PROVIDER_SITE_OTHER): Payer: Medicare Other | Admitting: *Deleted

## 2014-10-11 ENCOUNTER — Encounter (HOSPITAL_COMMUNITY): Payer: Self-pay | Admitting: *Deleted

## 2014-10-11 DIAGNOSIS — R0600 Dyspnea, unspecified: Secondary | ICD-10-CM

## 2014-10-11 DIAGNOSIS — I5022 Chronic systolic (congestive) heart failure: Secondary | ICD-10-CM

## 2014-10-11 DIAGNOSIS — I10 Essential (primary) hypertension: Secondary | ICD-10-CM

## 2014-10-11 LAB — BASIC METABOLIC PANEL
BUN: 25 mg/dL — ABNORMAL HIGH (ref 6–23)
CALCIUM: 9.3 mg/dL (ref 8.4–10.5)
CO2: 32 mEq/L (ref 19–32)
CREATININE: 1.23 mg/dL — AB (ref 0.40–1.20)
Chloride: 104 mEq/L (ref 96–112)
GFR: 53.74 mL/min — AB (ref >60.00)
Glucose, Bld: 111 mg/dL — ABNORMAL HIGH (ref 70–99)
Potassium: 4.5 mEq/L (ref 3.5–5.1)
Sodium: 140 mEq/L (ref 135–145)

## 2014-10-11 LAB — BRAIN NATRIURETIC PEPTIDE: PRO B NATRI PEPTIDE: 736 pg/mL — AB (ref 0.0–100.0)

## 2014-10-14 ENCOUNTER — Telehealth: Payer: Self-pay

## 2014-10-14 ENCOUNTER — Encounter: Payer: Self-pay | Admitting: Internal Medicine

## 2014-10-14 ENCOUNTER — Telehealth: Payer: Self-pay | Admitting: Physician Assistant

## 2014-10-14 DIAGNOSIS — I10 Essential (primary) hypertension: Secondary | ICD-10-CM

## 2014-10-14 DIAGNOSIS — I5022 Chronic systolic (congestive) heart failure: Secondary | ICD-10-CM

## 2014-10-14 MED ORDER — FUROSEMIDE 40 MG PO TABS: 40.0000 mg | ORAL_TABLET | Freq: Two times a day (BID) | ORAL | Status: DC

## 2014-10-14 NOTE — Telephone Encounter (Signed)
-----   Message from Sueanne Margarita, MD sent at 10/11/2014  3:37 PM EST ----- BNP elevated please increase Lasix to 40mg  BID and recheck BNP and BEMT in 1 week and followup with PA in 2 weeks

## 2014-10-14 NOTE — Telephone Encounter (Signed)
Instructed patient to INCREASE Lasix to 40 mg BID. Labs to be rechecked next Monday. Appt with Estella Husk already scheduled for Monday. Patient agrees with treatment plan.

## 2014-10-14 NOTE — Telephone Encounter (Signed)
The patient called to report that her right leg hurt, from the buttocks to her foot, stating it feels like an ice cream "brain freeze".  This was the text from the pager.     The patient states her leg had a cold sensation yesterday but that it is feeling better today.  It is not actually cold to the touch.  No numbness or paresthesia.  She will call tomorrow to try and get seen.  If it worsens, or actually gets cold, she will go to the ER.    Gabryel Talamo, PAC

## 2014-10-16 ENCOUNTER — Ambulatory Visit (HOSPITAL_BASED_OUTPATIENT_CLINIC_OR_DEPARTMENT_OTHER): Payer: Medicare Other | Attending: Cardiology | Admitting: Radiology

## 2014-10-16 VITALS — Ht 62.0 in | Wt 174.0 lb

## 2014-10-16 DIAGNOSIS — G4733 Obstructive sleep apnea (adult) (pediatric): Secondary | ICD-10-CM | POA: Diagnosis not present

## 2014-10-16 DIAGNOSIS — G4730 Sleep apnea, unspecified: Secondary | ICD-10-CM | POA: Diagnosis not present

## 2014-10-18 ENCOUNTER — Ambulatory Visit: Payer: Medicare Other | Admitting: Physician Assistant

## 2014-10-21 ENCOUNTER — Other Ambulatory Visit (INDEPENDENT_AMBULATORY_CARE_PROVIDER_SITE_OTHER): Payer: Medicare Other | Admitting: *Deleted

## 2014-10-21 ENCOUNTER — Ambulatory Visit (INDEPENDENT_AMBULATORY_CARE_PROVIDER_SITE_OTHER): Payer: Medicare Other | Admitting: *Deleted

## 2014-10-21 ENCOUNTER — Ambulatory Visit (INDEPENDENT_AMBULATORY_CARE_PROVIDER_SITE_OTHER): Payer: Medicare Other | Admitting: Physician Assistant

## 2014-10-21 ENCOUNTER — Other Ambulatory Visit: Payer: Medicare Other

## 2014-10-21 ENCOUNTER — Encounter: Payer: Self-pay | Admitting: Physician Assistant

## 2014-10-21 VITALS — BP 103/62 | HR 70 | Ht 62.0 in | Wt 174.0 lb

## 2014-10-21 DIAGNOSIS — I5022 Chronic systolic (congestive) heart failure: Secondary | ICD-10-CM | POA: Diagnosis not present

## 2014-10-21 DIAGNOSIS — R42 Dizziness and giddiness: Secondary | ICD-10-CM

## 2014-10-21 DIAGNOSIS — I442 Atrioventricular block, complete: Secondary | ICD-10-CM | POA: Diagnosis not present

## 2014-10-21 DIAGNOSIS — I420 Dilated cardiomyopathy: Secondary | ICD-10-CM | POA: Diagnosis not present

## 2014-10-21 DIAGNOSIS — I10 Essential (primary) hypertension: Secondary | ICD-10-CM

## 2014-10-21 DIAGNOSIS — R7989 Other specified abnormal findings of blood chemistry: Secondary | ICD-10-CM

## 2014-10-21 LAB — BASIC METABOLIC PANEL
BUN: 29 mg/dL — AB (ref 6–23)
CHLORIDE: 106 meq/L (ref 96–112)
CO2: 24 mEq/L (ref 19–32)
CREATININE: 1.15 mg/dL — AB (ref 0.50–1.10)
Calcium: 9.5 mg/dL (ref 8.4–10.5)
Glucose, Bld: 143 mg/dL — ABNORMAL HIGH (ref 70–99)
Potassium: 5.3 mEq/L (ref 3.5–5.3)
SODIUM: 137 meq/L (ref 135–145)

## 2014-10-21 LAB — MDC_IDC_ENUM_SESS_TYPE_INCLINIC
Battery Voltage: 3.07 V
Brady Statistic AP VS Percent: 0.02 %
Date Time Interrogation Session: 20160222140333
Lead Channel Impedance Value: 1007 Ohm
Lead Channel Impedance Value: 456 Ohm
Lead Channel Impedance Value: 494 Ohm
Lead Channel Impedance Value: 532 Ohm
Lead Channel Impedance Value: 570 Ohm
Lead Channel Pacing Threshold Amplitude: 1.25 V
Lead Channel Pacing Threshold Pulse Width: 0.4 ms
Lead Channel Setting Pacing Amplitude: 3.5 V
Lead Channel Setting Pacing Pulse Width: 0.4 ms
Lead Channel Setting Sensing Sensitivity: 4 mV
MDC IDC MSMT LEADCHNL LV IMPEDANCE VALUE: 1216 Ohm
MDC IDC MSMT LEADCHNL LV IMPEDANCE VALUE: 874 Ohm
MDC IDC MSMT LEADCHNL LV PACING THRESHOLD PULSEWIDTH: 0.8 ms
MDC IDC MSMT LEADCHNL RA IMPEDANCE VALUE: 399 Ohm
MDC IDC MSMT LEADCHNL RA PACING THRESHOLD AMPLITUDE: 0.5 V
MDC IDC MSMT LEADCHNL RA PACING THRESHOLD PULSEWIDTH: 0.4 ms
MDC IDC MSMT LEADCHNL RA SENSING INTR AMPL: 3.625 mV
MDC IDC MSMT LEADCHNL RV IMPEDANCE VALUE: 437 Ohm
MDC IDC MSMT LEADCHNL RV PACING THRESHOLD AMPLITUDE: 0.5 V
MDC IDC SET LEADCHNL LV PACING AMPLITUDE: 3 V
MDC IDC SET LEADCHNL LV PACING PULSEWIDTH: 0.8 ms
MDC IDC SET LEADCHNL RV PACING AMPLITUDE: 3.5 V
MDC IDC SET ZONE DETECTION INTERVAL: 400 ms
MDC IDC STAT BRADY AP VP PERCENT: 44.71 %
MDC IDC STAT BRADY AS VP PERCENT: 55.23 %
MDC IDC STAT BRADY AS VS PERCENT: 0.03 %
MDC IDC STAT BRADY RA PERCENT PACED: 44.74 %
MDC IDC STAT BRADY RV PERCENT PACED: 99.94 %
Zone Setting Detection Interval: 400 ms

## 2014-10-21 LAB — BRAIN NATRIURETIC PEPTIDE: Pro B Natriuretic peptide (BNP): 215 pg/mL — ABNORMAL HIGH (ref 0.0–100.0)

## 2014-10-21 MED ORDER — FUROSEMIDE 40 MG PO TABS: 40.0000 mg | ORAL_TABLET | Freq: Every day | ORAL | Status: DC

## 2014-10-21 NOTE — Patient Instructions (Addendum)
Your physician has recommended you make the following change in your medication:   START TAKING LASIX 40 MG ONCE A DAY   STOP BUT DON'T THROW RX OF SPRINOLACTONE UNTIL DR TURNER DECIDES OTHER WISE   FOLLOW UP WITH DR TURNER IN 2 TO 4 WEEKS    YOU MAY TAKE A EXTRA LASIX IF YOU GAIN 2 TO 3 LBS OVERNIGHT   MAKE SURE YOU WEIGH YOURSELF DAILY

## 2014-10-21 NOTE — Progress Notes (Signed)
Cardiology Office Note   Date:  10/21/2014   ID:  Kim Mullins, DOB 1930/04/23, MRN [id number]  PCP:  Kristine Garbe, MD  Cardiologist:  Golden Hurter M.D. Electrophysiologist: Crissie Sickles  Chief Complaint: Dizzy    History of Present Illness: Kim Mullins is a 81 y.o. female who presents for follow-up after recent pacemaker. She presented to the hospital with dizziness and fatigue and was found to be in complete heart block. She has a known dilated cardiomyopathy and was on no AV nodal blocking agents. She underwent implantation of MDT CRT P on 10/07/14 by Dr. Lovena Le. She was started on Coreg 3.125 mg twice a day to be titrated up as an outpatient.  She recently had a 2-D echo that showed an EF of 40-45% with severe hypokinesis of the inferior wall with moderately leaky mitral valve, moderate PR and moderate pulmonary hypertension. She's had dyspnea on exertion since May. She is also had lower extremity edema. She had an intermediate risk stress nuclear study with a small moderate intensity, reversible inferior defect consistent with mild inferior ischemia. She underwent cardiac cath showed normal coronary arteries with severe LV dysfunction EF 30-35% with 2+ MR mild to moderate pulmonary hypertension. She had been given a prescription for Colanor but never took it.  Patient comes in today feeling very dizzy. She said she came in for blood work this morning and was afraid to drive home. She gets dizzy when she stands up. She is also having trouble breaking the Aldactone in half. Patient's Lasix was increased to 40 mg twice a day and 10/11/14 because of an elevated BNP.    Past Medical History  Diagnosis Date  . Bowel obstruction   . Left tibial fracture 2007  . Asthma     Allergixc reaction to cats.  . Anemia     occassionally  . Arthritis     all over.  . Diabetes mellitus 2006    Diet and exercise controlled.  Marland Kitchen GERD (gastroesophageal reflux disease)     occ  .  Osteoarthritis of right shoulder region 06/26/2013  . DCM (dilated cardiomyopathy) 08/16/2014    normal coronary arteries on cath with EF 30-45%   . Chronic systolic CHF (congestive heart failure)     EF 30-35% by cath  . Complete heart block     a. s/p MDT CRTP pacemaker    Past Surgical History  Procedure Laterality Date  . Abdominal hysterectomy  1969  . Dilation and curettage of uterus  1968  . Pilonidal cyst excision  1959  . Corn removal  1999    Bilateral feet  . Bunionectomy  1984    Bilateral  . Tonsillectomy  1942  . Eye surgery  1983    Surgery to fix Retainal detachment, bilateral  . Carpal tunnell  2004    Bilateral  . Total hip arthroplasty  11/16/2011    Procedure: TOTAL HIP ARTHROPLASTY ANTERIOR APPROACH;  Surgeon: Hessie Dibble, MD;  Location: [facility];  Service: Orthopedics;  Laterality: Right;  DEPUY  . Total shoulder arthroplasty Right 06/26/2013    Procedure: TOTAL SHOULDER ARTHROPLASTY;  Surgeon: Johnny Bridge, MD;  Location: [facility];  Service: Orthopedics;  Laterality: Right;  . Cataract extraction Right     early 2015  . Joint replacement    . Esophagogastroduodenoscopy (egd) with propofol N/A 06/11/2014    Procedure: ESOPHAGOGASTRODUODENOSCOPY (EGD) WITH PROPOFOL;  Surgeon: Inda Castle, MD;  Location: [facility];  Service: Endoscopy;  Laterality: N/A;  . Bravo ph study N/A 06/11/2014    Procedure: BRAVO [place] STUDY;  Surgeon: Inda Castle, MD;  Location: [facility];  Service: Endoscopy;  Laterality: N/A;  Venia Minks dilation  06/11/2014    Procedure: Venia Minks DILATION;  Surgeon: Inda Castle, MD;  Location: [facility];  Service: Endoscopy;;  . Left and right heart catheterization with coronary angiogram N/A 08/29/2014    Procedure: LEFT AND RIGHT HEART CATHETERIZATION WITH CORONARY ANGIOGRAM;  Surgeon: Peter M Martinique, MD;  Location: [facility] CATH LAB;  Service: Cardiovascular;  Laterality: N/A;  . Bi-ventricular pacemaker insertion N/A 10/07/2014     MDT CRTP implanted by Dr Lovena Le     Current Outpatient Prescriptions  Medication Sig Dispense Refill  . acetaminophen (TYLENOL) 325 MG tablet Take 2 tablets (650 mg total) by mouth every 6 (six) hours as needed for moderate pain. 30 tablet 0  . aspirin EC 81 MG tablet Take 81 mg by mouth daily.    . bisacodyl (DULCOLAX) 5 MG EC tablet Take 5 mg by mouth daily as needed for mild constipation or moderate constipation.    . carvedilol (COREG) 3.125 MG tablet Take 1 tablet (3.125 mg total) by mouth 2 (two) times daily with a meal. 60 tablet 5  . furosemide (LASIX) 40 MG tablet Take 1 tablet (40 mg total) by mouth 2 (two) times daily. 60 tablet 6  . lisinopril (PRINIVIL,ZESTRIL) 5 MG tablet Take 1 tablet (5 mg total) by mouth daily. 30 tablet 3  . meloxicam (MOBIC) 15 MG tablet Take 15 mg by mouth daily.  5  . polyethylene glycol (MIRALAX / GLYCOLAX) packet Take 17 g by mouth daily.    Marland Kitchen spironolactone (ALDACTONE) 25 MG tablet Take 0.5 tablets (12.5 mg total) by mouth daily. 15 tablet 3   No current facility-administered medications for this visit.    Allergies:   Hydrocodone; Percocet; Mercurial derivatives; Eggs or egg-derived products; Penicillins; and Quinine derivatives    Social History:  The patient  reports that she quit smoking about 36 years ago. Her smoking use included Cigarettes. She has a 45 pack-year smoking history. She has never used smokeless tobacco. She reports that she drinks about 4.2 oz of alcohol per week. She reports that she does not use illicit drugs.   Family History:  The patient's family history includes Anuerysm in her daughter; Cancer in her father; Coronary artery disease in her mother; Dementia in her mother; Diabetes in her maternal grandfather. There is no history of Colon cancer.    ROS:  Please see the history of present illness.   Otherwise, review of systems are positive for chronic muscle pains balance issues leg pain.   All other systems are reviewed  and negative.    PHYSICAL EXAM: BP 103/62 mmHg  Pulse 70  Ht 5\' 2"  (1.575 m)  Wt 174 lb (78.926 kg)  BMI 31.82 kg/m2 GEN: Well nourished, well developed, in no acute distress HEENT: normal Neck: no JVD, HJR, carotid bruits, or masses Cardiac: RRR; no gallop ,murmurs, rubs, thrill or heave,no edema,   Respiratory:  clear to auscultation bilaterally, normal work of breathing GI: soft, nontender, nondistended, + BS MS: no deformity or atrophy Extremities: without cyanosis, clubbing, edema, good distal pulses bilaterally.  Skin: warm and dry, no rash Neuro:  Strength and sensation are intact Psych: euthymic mood, full affect   EKG:  EKG is ordered today. The ekg ordered today demonstrates ventricular paced rhythm   Recent Labs: 04/29/2014: TSH  1.46 10/06/2014: ALT 23; Magnesium 2.2 10/07/2014: Hemoglobin 11.6*; Platelets 196 10/11/2014: BUN 25*; Creatinine 1.23*; Potassium 4.5; Pro B Natriuretic peptide (BNP) 736.0*; Sodium 140    Lipid Panel No results found for: CHOL, TRIG, HDL, CHOLHDL, VLDL, LDLCALC, LDLDIRECT    Wt Readings from Last 3 Encounters:  10/16/14 174 lb (78.926 kg)  10/08/14 177 lb 14.6 oz (80.7 kg)  10/04/14 182 lb 1.9 oz (82.609 kg)      Other studies Reviewed: Additional studies/ records that were reviewed today include:  Review of the above records demonstrates:  Cardiac catheterization 08/29/14 Final Conclusions:   1. Normal coronary anatomy 2. Severe LV dysfunction. 3. Mild to moderate mitral insufficiency. 4. Mild to moderate pulmonary HTN.  Recommendations: Medical therapy for nonischemic cardiomyopathy.   [name], [place] 08/29/2014, 8:35 AM  Lexi scan Myoview 08/22/14 Impression Exercise Capacity:  Lexiscan with no exercise. BP Response:  Normal blood pressure response. Clinical Symptoms:  No chest pain or dyspnea. ECG Impression:  No significant ST segment change suggestive of ischemia. Comparison with Prior Nuclear Study: No  previous nuclear study performed  Overall Impression:  Intermediate risk stress nuclear study with a small, moderate intensity, reversible inferior defect consistent with mild inferior ischemia; study intermediate risk due to reduced LV function.  LV Ejection Fraction: 43%.  LV Wall Motion:  Global hypokinesis.  2-D echo 06/2014 Study Conclusions  - Left ventricle: The cavity size was normal. Wall thickness was   increased in a pattern of moderate LVH. Systolic function was   mildly to moderately reduced. The estimated ejection fraction was   in the range of 40% to 45%. Severe hypokinesis of the inferior   myocardium. - Mitral valve: There was moderate regurgitation. - Left atrium: The atrium was mildly to moderately dilated. - Right atrium: The atrium was mildly dilated. - Pulmonic valve: There was moderate regurgitation. - Pulmonary arteries: Systolic pressure was moderately increased.   PA peak pressure: 47 mm Hg (S).  ASSESSMENT AND PLAN: Dizziness Patient is dizzy with hypotension. I believe she's been over diuresed. We'll decrease Lasix to 40 mg once daily. Stop Aldactone at this time. She's having trouble cutting the pill and says she loses most of it.   Complete heart block Status post recent by the pacemaker by Dr. Lovena Le. Follow-up with Dr. Lovena Le.   Chronic systolic CHF (congestive heart failure) Heart failure is compensated. Decrease Lasix to 40 mg once daily. Follow-up with Dr. Radford Pax.   DCM (dilated cardiomyopathy) Patient is well compensated today. Decrease Lasix. She is on ACE inhibitor.     Sumner Boast, PA-C  10/21/2014 12:43 PM    [facility] [place], [place], [place]  [address] Phone: [phone]; Fax: [phone]

## 2014-10-21 NOTE — Assessment & Plan Note (Signed)
Patient is dizzy with hypotension. I believe she's been over diuresed. We'll decrease Lasix to 40 mg once daily. Stop Aldactone at this time. She's having trouble cutting the pill and says she loses most of it.

## 2014-10-21 NOTE — Assessment & Plan Note (Signed)
Status post recent by the pacemaker by Dr. Lovena Le. Follow-up with Dr. Lovena Le.

## 2014-10-21 NOTE — Assessment & Plan Note (Addendum)
Heart failure is compensated. BNP is 2:15 today that was over 700. I will decrease Lasix to 40 mg once daily. She can take an extra Lasix if she gains 2 or 3 pounds overnight. 2 g sodium diet. Follow-up with Dr. Radford Pax.

## 2014-10-21 NOTE — Progress Notes (Signed)
Wound check appointment. Steri-strips removed. Wound without redness or edema. Incision edges approximated, wound well healed. Normal device function. Thresholds, sensing, and impedances consistent with implant measurements. Device programmed at 3.5V/auto capture programmed on for extra safety margin until 3 month visit. Histogram distribution appropriate for patient and level of activity. No mode switches or high ventricular rates noted. Patient educated about wound care, arm mobility, lifting restrictions. ROV in 3 months with GT.

## 2014-10-21 NOTE — Assessment & Plan Note (Signed)
Patient is well compensated today. Decrease Lasix. She is on ACE inhibitor.

## 2014-10-22 ENCOUNTER — Telehealth: Payer: Self-pay | Admitting: Cardiology

## 2014-10-22 NOTE — Telephone Encounter (Signed)
Please have patient stop meloxicam which can cause fluid retention and worsen CHF and also cause hyperkalemia

## 2014-10-23 ENCOUNTER — Encounter: Payer: Self-pay | Admitting: Cardiology

## 2014-10-23 ENCOUNTER — Telehealth: Payer: Self-pay | Admitting: Cardiology

## 2014-10-23 ENCOUNTER — Telehealth: Payer: Self-pay

## 2014-10-23 DIAGNOSIS — G4733 Obstructive sleep apnea (adult) (pediatric): Secondary | ICD-10-CM

## 2014-10-23 DIAGNOSIS — E875 Hyperkalemia: Secondary | ICD-10-CM

## 2014-10-23 HISTORY — DX: Obstructive sleep apnea (adult) (pediatric): G47.33

## 2014-10-23 MED ORDER — ZOLPIDEM TARTRATE 5 MG PO TABS: ORAL_TABLET | ORAL | Status: DC

## 2014-10-23 NOTE — Telephone Encounter (Signed)
New Msg         Pt returning call.      Please call

## 2014-10-23 NOTE — Telephone Encounter (Signed)
Please let patient know that BIPAP titration was unsuccessful due lack of adequate sleep to allow titration as well as frequent apneas.  Please repeat BiPAP study with addition of ambien 5mg  to be taken when patient gets to sleep lab.

## 2014-10-23 NOTE — Telephone Encounter (Signed)
Left message to call back  

## 2014-10-23 NOTE — Telephone Encounter (Signed)
Instructed patient to DECREASE potassium rich foods and recheck BMET in one week. Patient agrees with treatment plan.

## 2014-10-23 NOTE — Telephone Encounter (Signed)
Patient informed of results and verbal understanding expressed.  Repeat BiPAP titration ordered for scheduling.  Ambien 5 mg ordered for patient to take when she arrives at sleep lab. Patient to pick up Rx on Monday.

## 2014-10-23 NOTE — Addendum Note (Signed)
Addended by: Harland German A on: 10/23/2014 04:27 PM   Modules accepted: Orders

## 2014-10-23 NOTE — Sleep Study (Signed)
   NAME: [name] DATE OF BIRTH:  1930/06/11 MEDICAL RECORD NUMBER [id number]  LOCATION: [facility]  PHYSICIAN: Beau Vanduzer R  DATE OF STUDY: 10/16/2014  SLEEP STUDY TYPE: Positive Airway Pressure Titration               REFERRING PHYSICIAN: Sueanne Margarita, MD  INDICATION FOR STUDY: witnessed apnea, snoring, excessive daytime sleepiness and OSA  EPWORTH SLEEPINESS SCORE: 9 HEIGHT: 5\' 2"  (157.5 cm)  WEIGHT: 174 lb (78.926 kg)    Body mass index is 31.82 kg/(m^2).  NECK SIZE: 14 in.  MEDICATIONS: Reviewed in the chart  SLEEP ARCHITECTURE: The patient slept for a total of 124 minutes out of a total sleep period of 210 minutes.  There was no REM or slow wave sleep.  The sleep efficiency was markedly reduced at 34%.  The onset to sleep latency was prolonged at 22 minutes.    RESPIRATORY DATA: The patient was started on BiPAP at 8/4cm H2O and titrated for respiratory events and snoring to 12/6cm H2O.  The patient continued to have apneas during the titration with poor sleep.  The patient did not have enough sleep time for an adequate titration.  The AHI at 12/6cm H2O was 39.6/hr.    OXYGEN DATA: The lowest Oxygen saturation during the titration was 87%.    CARDIAC DATA: The patient maintained paced rhythm during the study.  The average HR was 60 bpm.  The minimum HR was 35 bpm and the maximum HR was 162 bpm.  There were occasional PVC's.  MOVEMENT/PARASOMNIA: There were an increased number of periodic limb movements with an elevated PLMS index of 9.7 events per hour.  There were no REM sleep behavior disorders.  IMPRESSION/ RECOMMENDATION:   1.  Unsuccessful BiPAP titration due to markedly reduced sleep time with inability to maintain sleep and increased apneas during sleep 2.  Oxygen desaturations as low as 87%.   3.  The patient maintained paced rhythm with occasional PVC's. 4.  Increased periodic limb movements 5.  Reduced sleep efficiency with no  REM or slow wave sleep.  6.  Recommend repeat BIPAP titration with sleep aide for successful BiPAP titration.  Signed Sueanne Margarita Diplomate, American Board of Sleep Medicine  ELECTRONICALLY SIGNED ON:  10/23/2014, 11:49 AM [place] PH: [phone]   FX: [phone] [place]

## 2014-10-23 NOTE — Addendum Note (Signed)
Addended by: Harland German A on: 10/23/2014 04:40 PM   Modules accepted: Orders, Medications

## 2014-10-23 NOTE — Telephone Encounter (Signed)
This encounter was created in error - please disregard.

## 2014-10-23 NOTE — Telephone Encounter (Signed)
Instructed patient to STOP meloxicam. Instructed patient to call her PCP if she would like recommendations for something else to take.  Patient agrees with treatment plan.

## 2014-10-23 NOTE — Telephone Encounter (Signed)
-----   Message from Sueanne Margarita, MD sent at 10/22/2014  4:43 PM EST ----- Please have her cut back on potassium rich food and recheck BMET in 1 week

## 2014-10-24 ENCOUNTER — Encounter: Payer: Self-pay | Admitting: Internal Medicine

## 2014-10-24 ENCOUNTER — Encounter: Payer: Self-pay | Admitting: Cardiology

## 2014-10-24 NOTE — Telephone Encounter (Signed)
Called patient about her e-mail. Left message to call back.  This is the patient's e-mail-   A) I am still experiencing some shortness of breath. Is that to be expected?    B) why are we doing the sleep studies?

## 2014-10-25 ENCOUNTER — Telehealth: Payer: Self-pay | Admitting: *Deleted

## 2014-10-25 DIAGNOSIS — R0602 Shortness of breath: Secondary | ICD-10-CM

## 2014-10-25 NOTE — Telephone Encounter (Signed)
Sent message via My Chart wanting to know why she needed sleep study and that she was still having SOB.  Sent to Dr. Radford Pax who responded that sleep study was to rule out sleep apnea as etiology of pulmonary HTN.  Spoke w/pt and advised her that Dr. Radford Pax wants her to get BNP and have nurse visit for BP and HR.  She was scheduled for lab on Mon 2/29 so will add BNP and also scheduled for nurse visit at 11:00.  She also states that the sleep study is scheduled for May 1 and wants to know since she is having problems how can she wait that long.  Advised will try to reschedule and call her back.  Sleep study changed to March 6 at 8 PM.  Order placed for BNP for 2/29.  Notified pt that sleep study has been changed and if they have a cancellation will call her.  She understands and will be here on Monday 2/29.

## 2014-10-28 ENCOUNTER — Ambulatory Visit: Payer: Medicare Other

## 2014-10-28 ENCOUNTER — Other Ambulatory Visit (INDEPENDENT_AMBULATORY_CARE_PROVIDER_SITE_OTHER): Payer: Medicare Other | Admitting: *Deleted

## 2014-10-28 ENCOUNTER — Ambulatory Visit (HOSPITAL_BASED_OUTPATIENT_CLINIC_OR_DEPARTMENT_OTHER): Payer: Medicare Other | Attending: Cardiology | Admitting: Radiology

## 2014-10-28 VITALS — BP 116/78 | HR 84 | Resp 26 | Wt 174.0 lb

## 2014-10-28 VITALS — Ht 62.0 in | Wt 174.0 lb

## 2014-10-28 DIAGNOSIS — I5022 Chronic systolic (congestive) heart failure: Secondary | ICD-10-CM

## 2014-10-28 DIAGNOSIS — I420 Dilated cardiomyopathy: Secondary | ICD-10-CM

## 2014-10-28 DIAGNOSIS — G4733 Obstructive sleep apnea (adult) (pediatric): Secondary | ICD-10-CM

## 2014-10-28 DIAGNOSIS — E875 Hyperkalemia: Secondary | ICD-10-CM

## 2014-10-28 DIAGNOSIS — R0602 Shortness of breath: Secondary | ICD-10-CM

## 2014-10-28 LAB — BASIC METABOLIC PANEL
BUN: 31 mg/dL — ABNORMAL HIGH (ref 6–23)
CALCIUM: 9.4 mg/dL (ref 8.4–10.5)
CO2: 26 meq/L (ref 19–32)
CREATININE: 1.28 mg/dL — AB (ref 0.40–1.20)
Chloride: 104 mEq/L (ref 96–112)
GFR: 51.32 mL/min — AB (ref >60.00)
Glucose, Bld: 103 mg/dL — ABNORMAL HIGH (ref 70–99)
Potassium: 4.8 mEq/L (ref 3.5–5.1)
SODIUM: 136 meq/L (ref 135–145)

## 2014-10-28 LAB — BRAIN NATRIURETIC PEPTIDE: PRO B NATRI PEPTIDE: 188 pg/mL — AB (ref 0.0–100.0)

## 2014-10-28 NOTE — Progress Notes (Signed)
1.) Reason for visit: BP and HR check/ labs BMET and BNP  2.) Name of MD requesting visit: Dr. Radford Pax  3.) H&P: Patient has a history of DCM, Chronic systolic CHF, complete heart block (pacemaker). Patient's vital signs this visit are BP 116/78, HR 84, Resp. 26, and O2 100% on room air.   4.) ROS related to problem: Patient's vital signs this visit are BP 116/78, HR 84, Resp. 26, and O2 100% on room air. Patient complained of weakness and SOB. Bilateral lungs sound clear, slightly diminished at bases.   5.) Assessment and plan per MD: Informed Dr. Harrington Challenger (DOD) of patient's respirations and SOB. Plan is to add CBC to labs and ambulate patient up and down the hall to see her O2 saturation. Patient ambulating with cane, gait steady and O2 dropped to 97% on room air. Will await lab results.

## 2014-10-28 NOTE — Patient Instructions (Signed)
Your physician recommends that you continue on your current medications as directed. Please refer to the Current Medication list given to you today.  Your physician recommends that you have labs today. BMET, BNP, and CBC

## 2014-10-30 ENCOUNTER — Telehealth: Payer: Self-pay | Admitting: Cardiology

## 2014-10-30 NOTE — Telephone Encounter (Signed)
Please let patient know that she had a successful BiPAP titration and let [facility] know that order is in EPIC.  Please set patient up to see me in 10 weeks

## 2014-10-30 NOTE — Sleep Study (Signed)
   NAME: Kim Mullins DATE OF BIRTH:  20-Nov-1929 MEDICAL RECORD NUMBER [id number]  [facility]  PHYSICIAN: TURNER,TRACI R  DATE OF STUDY: 10/28/2014  SLEEP STUDY TYPE: Positive Airway Pressure Titration               REFERRING PHYSICIAN: Sueanne Margarita, MD  INDICATION FOR STUDY: Moderate OSA with AHI 21.7/hr  EPWORTH SLEEPINESS SCORE: 14 HEIGHT: 5\' 2"  (157.5 cm)  WEIGHT: 174 lb (78.926 kg)    Body mass index is 31.82 kg/(m^2).  NECK SIZE: 14 in.  MEDICATIONS: Reviewed in the chart  SLEEP ARCHITECTURE: The patient slept for a total of 329 minutes out of a total sleep period of 403 minutes.  There was no slow wave sleep and 75 minutes of REM sleep.  The onset to REM sleep latency was 52 minutes.  The sleep efficiency was mildly reduced at 79%.    RESPIRATORY DATA: The patient was started on BiPAP at 8/5cm H2O and titrated for respiratory events and snoring to 12/8cm H2O.  The patient was able to achieve REM supine sleep at optimum pressure and maintain that pressure with a significant reduction in respiratory events.  The AHI at 10/6cm H2O was 7.1 events per hour.  At higher pressures the patient had a significant number of central apnea.    OXYGEN DATA: The lowest oxygen saturation during REM sleep was 93%.  CARDIAC DATA: The patient maintained a paced rhythm with PVC's during the study.  MOVEMENT/PARASOMNIA: There were an increased number of periodic limb movements with an elevated PLMS index of 34 movements per hour.  There were no REM sleep behavior disorders.  IMPRESSION/ RECOMMENDATION:   1.  Moderate obstructive sleep apnea/hypopnea syndrome with an AHI of 21.7 events per hour. Most events occurred during NREM sleep in the nonsupine position 2.  Successful BiPAP titration to 10/6cm H2O.  The patient was able to achieve REM supine sleep and maintain that pressure with a significant reduction in events with an AHI of 7/hr.  Although the AHI  was not reduced to < 5 events per hour, at higher pressures a significant number of central events emerged.   3.  Recommend ResMed BiPAP at 10/6cm H2O with heated humidifier and small [employer] Simplus full face mask.   4.  The patient should be counseled in good sleep hygiene and weight loss.  Signed: Sueanne Margarita Diplomate, American Board of Sleep Medicine  ELECTRONICALLY SIGNED ON:  10/30/2014, 9:30 PM [place] PH: [phone]   FX: [phone] [place]

## 2014-10-30 NOTE — Addendum Note (Signed)
Addended by: Sueanne Margarita on: 10/30/2014 09:43 PM   Modules accepted: Orders

## 2014-11-01 NOTE — Telephone Encounter (Signed)
Patient informed of BiPAP titration results and verbal understanding expressed.   10 week OV scheduled. [place] notified.  Patient agrees with treatment plan.

## 2014-11-03 ENCOUNTER — Encounter (HOSPITAL_BASED_OUTPATIENT_CLINIC_OR_DEPARTMENT_OTHER): Payer: Medicare Other

## 2014-11-05 ENCOUNTER — Encounter: Payer: Self-pay | Admitting: Internal Medicine

## 2014-12-04 ENCOUNTER — Encounter: Payer: Self-pay | Admitting: *Deleted

## 2014-12-04 ENCOUNTER — Telehealth: Payer: Self-pay | Admitting: Cardiology

## 2014-12-04 NOTE — Telephone Encounter (Signed)
New message      Pt cannot access mychart.

## 2014-12-04 NOTE — Telephone Encounter (Signed)
Spoke with pt. She has accessed my chart before but has been unable to access for last month.  I gave pt my chart assistance phone number to call for assistance.

## 2014-12-06 ENCOUNTER — Other Ambulatory Visit: Payer: Self-pay | Admitting: Physician Assistant

## 2014-12-06 ENCOUNTER — Ambulatory Visit (HOSPITAL_COMMUNITY): Payer: Medicare Other | Attending: Internal Medicine | Admitting: Radiology

## 2014-12-06 DIAGNOSIS — R0600 Dyspnea, unspecified: Secondary | ICD-10-CM | POA: Diagnosis present

## 2014-12-06 DIAGNOSIS — I5022 Chronic systolic (congestive) heart failure: Secondary | ICD-10-CM

## 2014-12-06 DIAGNOSIS — I420 Dilated cardiomyopathy: Secondary | ICD-10-CM | POA: Diagnosis not present

## 2014-12-06 NOTE — Progress Notes (Signed)
Echocardiogram performed.  

## 2014-12-06 NOTE — Telephone Encounter (Signed)
Review for refill. 

## 2014-12-09 ENCOUNTER — Other Ambulatory Visit: Payer: Self-pay | Admitting: *Deleted

## 2014-12-10 ENCOUNTER — Encounter: Payer: Self-pay | Admitting: Cardiology

## 2014-12-10 ENCOUNTER — Ambulatory Visit (INDEPENDENT_AMBULATORY_CARE_PROVIDER_SITE_OTHER): Payer: Medicare Other | Admitting: Cardiology

## 2014-12-10 VITALS — BP 116/72 | HR 58 | Ht 63.0 in | Wt 184.6 lb

## 2014-12-10 DIAGNOSIS — G4733 Obstructive sleep apnea (adult) (pediatric): Secondary | ICD-10-CM

## 2014-12-10 DIAGNOSIS — I270 Primary pulmonary hypertension: Secondary | ICD-10-CM

## 2014-12-10 DIAGNOSIS — I442 Atrioventricular block, complete: Secondary | ICD-10-CM

## 2014-12-10 DIAGNOSIS — I272 Pulmonary hypertension, unspecified: Secondary | ICD-10-CM

## 2014-12-10 DIAGNOSIS — I5022 Chronic systolic (congestive) heart failure: Secondary | ICD-10-CM

## 2014-12-10 DIAGNOSIS — I10 Essential (primary) hypertension: Secondary | ICD-10-CM

## 2014-12-10 DIAGNOSIS — I420 Dilated cardiomyopathy: Secondary | ICD-10-CM | POA: Diagnosis not present

## 2014-12-10 NOTE — Progress Notes (Signed)
Cardiology Office Note   Date:  12/10/2014   ID:  Kim Mullins, DOB 12/02/1929, MRN [id number]  PCP:  Kristine Garbe, MD    No chief complaint on file.     History of Present Illness: Kim Mullins is a 81 y.o. female with a history of asthma, anemia, GERD with esophageal stricture s/p dilatation, DM diet controlled who presents today for followup of SOB. She has been seen by Dr. Melvyn Novas for wheezing and DOE. 2D echo was owas ordered by Dr. Melvyn Novas which showed mildly reduced LVF with EF 40-45% with severe hypokinesis of the inferior wall, moderately leaky MV, moderate PR and moderate pulmonary HTN. She says that DOE has been present since May. She denies any PND or orthopnea. She denies any LE edema, chest pain or pressure. She denies any palpitations, dizziness or syncope. She underwent nuclear stress test that showed an ntermediate risk stress nuclear study with a small, moderate intensity, reversible inferior defect consistent with mild inferior ischemia; study intermediate risk due to reduced LV function. She underwent cath showing normal coronary arteries with severe LV dysfunction EF 30-35% with 2+ MR and mild to moderate pulmonary HTN. She now presents today for followup.  She has not had any LE edema. Her SOB has improved unless she does strenuous exercise.  She denies any LE edema, chest pain, dizziness or syncope.She underwent sleep study showing moderate OSA and underwent CPAP titration. She was found recently to have complete heart block and underwent PPM.  She is now on medical therapy for her DCM with ACE I/BB and diuretic.      Past Medical History  Diagnosis Date  . Bowel obstruction   . Left tibial fracture 2007  . Asthma     Allergixc reaction to cats.  . Anemia     occassionally  . Arthritis     all over.  . Diabetes mellitus 2006    Diet and exercise controlled.  Marland Kitchen GERD (gastroesophageal reflux disease)     occ  . Osteoarthritis of right  shoulder region 06/26/2013  . DCM (dilated cardiomyopathy) 08/16/2014    normal coronary arteries on cath with EF 30-45%   . Chronic systolic CHF (congestive heart failure)     EF 30-35% by cath  . Complete heart block     a. s/p MDT CRTP pacemaker  . OSA (obstructive sleep apnea) 10/23/2014    Moderate with AHI 21/hr    Past Surgical History  Procedure Laterality Date  . Abdominal hysterectomy  1969  . Dilation and curettage of uterus  1968  . Pilonidal cyst excision  1959  . Corn removal  1999    Bilateral feet  . Bunionectomy  1984    Bilateral  . Tonsillectomy  1942  . Eye surgery  1983    Surgery to fix Retainal detachment, bilateral  . Carpal tunnell  2004    Bilateral  . Total hip arthroplasty  11/16/2011    Procedure: TOTAL HIP ARTHROPLASTY ANTERIOR APPROACH;  Surgeon: Hessie Dibble, MD;  Location: [place];  Service: Orthopedics;  Laterality: Right;  DEPUY  . Total shoulder arthroplasty Right 06/26/2013    Procedure: TOTAL SHOULDER ARTHROPLASTY;  Surgeon: Johnny Bridge, MD;  Location: [place];  Service: Orthopedics;  Laterality: Right;  . Cataract extraction Right     early 2015  . Joint replacement    . Esophagogastroduodenoscopy (egd) with propofol N/A 06/11/2014    Procedure: ESOPHAGOGASTRODUODENOSCOPY (EGD) WITH PROPOFOL;  Surgeon: Sandy Salaam  Deatra Ina, MD;  Location: [facility];  Service: Endoscopy;  Laterality: N/A;  . Bravo ph study N/A 06/11/2014    Procedure: BRAVO [place] STUDY;  Surgeon: Inda Castle, MD;  Location: [facility];  Service: Endoscopy;  Laterality: N/A;  Venia Minks dilation  06/11/2014    Procedure: Venia Minks DILATION;  Surgeon: Inda Castle, MD;  Location: [facility];  Service: Endoscopy;;  . Left and right heart catheterization with coronary angiogram N/A 08/29/2014    Procedure: LEFT AND RIGHT HEART CATHETERIZATION WITH CORONARY ANGIOGRAM;  Surgeon: Peter M Martinique, MD;  Location: [facility];  Service: Cardiovascular;  Laterality: N/A;  .  Bi-ventricular pacemaker insertion N/A 10/07/2014    MDT CRTP implanted by Dr Lovena Le     Current Outpatient Prescriptions  Medication Sig Dispense Refill  . aspirin EC 81 MG tablet Take 81 mg by mouth daily.    . bisacodyl (DULCOLAX) 5 MG EC tablet Take 5 mg by mouth daily as needed for mild constipation or moderate constipation.    . Calcium Carbonate-Vitamin D (CALCIUM + D PO) Take 1 tablet by mouth daily.    . carvedilol (COREG) 3.125 MG tablet Take 1 tablet (3.125 mg total) by mouth 2 (two) times daily with a meal. 60 tablet 5  . furosemide (LASIX) 40 MG tablet Take 1 tablet (40 mg total) by mouth daily. 60 tablet 6  . lisinopril (PRINIVIL,ZESTRIL) 5 MG tablet Take 1 tablet (5 mg total) by mouth daily. 30 tablet 3  . acetaminophen (TYLENOL) 325 MG tablet Take 2 tablets (650 mg total) by mouth every 6 (six) hours as needed for moderate pain. (Patient not taking: Reported on 12/10/2014) 30 tablet 0  . polyethylene glycol (MIRALAX / GLYCOLAX) packet Take 17 g by mouth daily.     No current facility-administered medications for this visit.    Allergies:   Hydrocodone; Percocet; Mercurial derivatives; Eggs or egg-derived products; Penicillins; and Quinine derivatives    Social History:  The patient  reports that she quit smoking about 36 years ago. Her smoking use included Cigarettes. She has a 45 pack-year smoking history. She has never used smokeless tobacco. She reports that she drinks about 4.2 oz of alcohol per week. She reports that she does not use illicit drugs.   Family History:  The patient's family history includes Anuerysm in her daughter; Cancer in her father; Coronary artery disease in her mother; Dementia in her mother; Diabetes in her maternal grandfather. There is no history of Colon cancer.    ROS:  Please see the history of present illness.   Otherwise, review of systems are positive for none.   All other systems are reviewed and negative.    PHYSICAL EXAM: VS:  BP  116/72 mmHg  Pulse 58  Ht 5\' 3"  (1.6 m)  Wt 184 lb 9.6 oz (83.734 kg)  BMI 32.71 kg/m2  SpO2 99% , BMI Body mass index is 32.71 kg/(m^2). GEN: Well nourished, well developed, in no acute distress HEENT: normal Neck: no JVD, carotid bruits, or masses Cardiac: RRR; no murmurs, rubs, or gallops,no edema  Respiratory:  clear to auscultation bilaterally, normal work of breathing GI: soft, nontender, nondistended, + BS MS: no deformity or atrophy Skin: warm and dry, no rash Neuro:  Strength and sensation are intact Psych: euthymic mood, full affect   EKG:  EKG is not ordered today.    Recent Labs: 04/29/2014: TSH 1.46 10/06/2014: ALT 23; Magnesium 2.2 10/07/2014: Hemoglobin 11.6*; Platelets 196 10/28/2014: BUN 31*; Creatinine  1.28*; Potassium 4.8; Pro B Natriuretic peptide (BNP) 188.0*; Sodium 136    Lipid Panel No results found for: CHOL, TRIG, HDL, CHOLHDL, VLDL, LDLCALC, LDLDIRECT    Wt Readings from Last 3 Encounters:  12/10/14 184 lb 9.6 oz (83.734 kg)  10/28/14 174 lb (78.926 kg)  10/28/14 174 lb (78.926 kg)     ASSESSMENT AND PLAN:  1. DOE secondary to nonischemic DCM - this has improved but she is still having some DOE.  Check BNP and BMET today.  Repeat echo a week ago showed improved LVF with EF 50%.   2. HTN - well controlled. Continue ACE I 3. Nonischemic DCM with severe LV dysfunction EF 30-35% by cath - Continue ACE I/ BB 4. Mild to moderate pulmonary HTN of unclear etiology - ? Secondary to systolic CHF/MR/asthma/?OSA.Sleep study has been ordered to rule out OSA. She has a history of GERD and asthma as well.  5. Normal coronary arteries by cath 6. Chronic systolic CHF - appears euvolemic on exam today but she is complaining of persistent SOB but also has asthma.  Continue ACE I, Coreg and Lasix.   7.  CHB s/p PPM - followed by Dr. Lovena Le 8.  OSA on CPAP therapy and doing well with her device.  She will be seeing me back in late May for sleep  followup  Current medicines are reviewed at length with the patient today.  The patient does not have concerns regarding medicines.  The following changes have been made:  no change  Labs/ tests ordered today include: see above assessment and plan  Orders Placed This Encounter  Procedures  . Basic Metabolic Panel (BMET)  . B Nat Peptide     Disposition:   FU with me in a few weeks for sleep followup   Signed, Sueanne Margarita, MD  12/10/2014 3:57 PM    [address], [place], [place]  [address] Phone: [phone]; Fax: [phone]

## 2014-12-10 NOTE — Patient Instructions (Signed)
Medication Instructions:  None   Labwork: Your physician recommends that you have lab work TODAY (BMET, BNP)  Testing/Procedures: None   Follow-Up: You have an appointment with Dr. Radford Pax scheduled for May 27th at 8:15.  Any Other Special Instructions Will Be Listed Below (If Applicable).

## 2014-12-11 LAB — BASIC METABOLIC PANEL
BUN: 18 mg/dL (ref 6–23)
CHLORIDE: 103 meq/L (ref 96–112)
CO2: 28 meq/L (ref 19–32)
Calcium: 9.1 mg/dL (ref 8.4–10.5)
Creatinine, Ser: 1.14 mg/dL (ref 0.40–1.20)
GFR: 58.64 mL/min — AB (ref >60.00)
GLUCOSE: 88 mg/dL (ref 70–99)
POTASSIUM: 4.9 meq/L (ref 3.5–5.1)
Sodium: 137 mEq/L (ref 135–145)

## 2014-12-11 LAB — BRAIN NATRIURETIC PEPTIDE: PRO B NATRI PEPTIDE: 270 pg/mL — AB (ref 0.0–100.0)

## 2014-12-18 ENCOUNTER — Other Ambulatory Visit (INDEPENDENT_AMBULATORY_CARE_PROVIDER_SITE_OTHER): Payer: Medicare Other | Admitting: *Deleted

## 2014-12-18 DIAGNOSIS — I10 Essential (primary) hypertension: Secondary | ICD-10-CM

## 2014-12-18 LAB — BASIC METABOLIC PANEL
BUN: 28 mg/dL — AB (ref 6–23)
CHLORIDE: 101 meq/L (ref 96–112)
CO2: 31 meq/L (ref 19–32)
CREATININE: 1.19 mg/dL (ref 0.40–1.20)
Calcium: 9 mg/dL (ref 8.4–10.5)
GFR: 55.8 mL/min — ABNORMAL LOW (ref >60.00)
Glucose, Bld: 108 mg/dL — ABNORMAL HIGH (ref 70–99)
Potassium: 4.1 mEq/L (ref 3.5–5.1)
Sodium: 135 mEq/L (ref 135–145)

## 2014-12-18 NOTE — Addendum Note (Signed)
Addended by: Eulis Foster on: 12/18/2014 03:26 PM   Modules accepted: Orders

## 2014-12-22 ENCOUNTER — Telehealth: Payer: Self-pay | Admitting: Cardiology

## 2014-12-22 NOTE — Telephone Encounter (Signed)
Paged by patient regarding new onset ringing in the ears. She is wondering whether it could be related to a heart problem or her pacemaker. She says she noticed it today but wonders if it has been present for some time and she is only noticing it now. No visual changes, numbness, tingling, dysarthria. No change in hearing but she says she has been hard of hearing for years.  No clear precipitation factors (loud music, sinus infection) that the patient can identify. Other that some very brief (1-2 seconds) periods of feeling dizzy today, nothing is out of the ordinary. No recent med changes.   We discussed that this is unlikely to be a cardiovascular issues. If the symptoms persist, I recommended she be seen by her PCP.

## 2014-12-24 ENCOUNTER — Encounter: Payer: Self-pay | Admitting: Cardiology

## 2014-12-26 ENCOUNTER — Telehealth: Payer: Self-pay | Admitting: Cardiology

## 2014-12-26 ENCOUNTER — Other Ambulatory Visit: Payer: Self-pay | Admitting: *Deleted

## 2014-12-26 DIAGNOSIS — G4733 Obstructive sleep apnea (adult) (pediatric): Secondary | ICD-10-CM

## 2014-12-26 NOTE — Telephone Encounter (Signed)
Explained to patient her download readings and why we are increasing her BiPAP.  Patient grateful for callback.

## 2014-12-26 NOTE — Telephone Encounter (Signed)
New  Problem   Pt returning call from nurse.

## 2014-12-26 NOTE — Telephone Encounter (Signed)
S/w Shawna @ [phone] after I was directed to 5 other numbers before I reached this one.  Do not do Prior Auth over phone has to fax form.  Form is being faxed today.

## 2014-12-29 ENCOUNTER — Encounter (HOSPITAL_BASED_OUTPATIENT_CLINIC_OR_DEPARTMENT_OTHER): Payer: Medicare Other

## 2014-12-30 ENCOUNTER — Telehealth: Payer: Self-pay | Admitting: Cardiology

## 2014-12-30 NOTE — Telephone Encounter (Signed)
Left message for patient explaining that I did not call her, and see no where in the chart of anyone who did. Instructed patient to call if she has any questions or needs to speak with me.

## 2014-12-30 NOTE — Telephone Encounter (Signed)
New message ° ° °Pt returning call °

## 2015-01-02 ENCOUNTER — Other Ambulatory Visit: Payer: Self-pay | Admitting: *Deleted

## 2015-01-02 ENCOUNTER — Telehealth: Payer: Self-pay | Admitting: Cardiology

## 2015-01-02 MED ORDER — LISINOPRIL 5 MG PO TABS: 5.0000 mg | ORAL_TABLET | Freq: Every day | ORAL | Status: DC

## 2015-01-06 MED ORDER — FUROSEMIDE 40 MG PO TABS: 40.0000 mg | ORAL_TABLET | Freq: Two times a day (BID) | ORAL | Status: DC

## 2015-01-06 NOTE — Telephone Encounter (Signed)
Notes Recorded by Sueanne Margarita, MD on 12/11/2014 at 11:39 AM BNP elevated - increase Lasix to 40mg  BID and recheck BMET in 1 week  Instructed patient she should be taking 40 mg Lasix BID.  Med list updated.  Patient agrees with treatment plan.

## 2015-01-06 NOTE — Telephone Encounter (Addendum)
New message       Pt want to know if she takes her furosemide once a day or twice a day?

## 2015-01-07 ENCOUNTER — Encounter: Payer: Medicare Other | Admitting: Internal Medicine

## 2015-01-07 ENCOUNTER — Ambulatory Visit: Payer: Medicare Other | Admitting: Cardiology

## 2015-01-08 ENCOUNTER — Encounter: Payer: Self-pay | Admitting: Cardiology

## 2015-01-13 ENCOUNTER — Encounter: Payer: Self-pay | Admitting: Cardiology

## 2015-01-14 ENCOUNTER — Telehealth: Payer: Self-pay

## 2015-01-14 DIAGNOSIS — G4733 Obstructive sleep apnea (adult) (pediatric): Secondary | ICD-10-CM

## 2015-01-14 NOTE — Telephone Encounter (Signed)
DME order placed.

## 2015-01-14 NOTE — Telephone Encounter (Signed)
-----   Message from Sueanne Margarita, MD sent at 01/14/2015  9:35 AM EDT ----- AHI too high -Have DME increase CPAP to 11/7cm H2O and recheck d/l in 2 weeks

## 2015-01-17 ENCOUNTER — Encounter: Payer: Self-pay | Admitting: Cardiology

## 2015-01-21 ENCOUNTER — Ambulatory Visit (INDEPENDENT_AMBULATORY_CARE_PROVIDER_SITE_OTHER): Payer: Medicare Other | Admitting: Internal Medicine

## 2015-01-21 ENCOUNTER — Encounter: Payer: Self-pay | Admitting: Internal Medicine

## 2015-01-21 ENCOUNTER — Encounter: Payer: Self-pay | Admitting: Cardiology

## 2015-01-21 VITALS — BP 110/74 | HR 85 | Ht 63.0 in | Wt 175.2 lb

## 2015-01-21 DIAGNOSIS — I442 Atrioventricular block, complete: Secondary | ICD-10-CM | POA: Diagnosis not present

## 2015-01-21 DIAGNOSIS — I5022 Chronic systolic (congestive) heart failure: Secondary | ICD-10-CM

## 2015-01-21 DIAGNOSIS — I420 Dilated cardiomyopathy: Secondary | ICD-10-CM

## 2015-01-21 DIAGNOSIS — I10 Essential (primary) hypertension: Secondary | ICD-10-CM | POA: Diagnosis not present

## 2015-01-21 DIAGNOSIS — Z950 Presence of cardiac pacemaker: Secondary | ICD-10-CM

## 2015-01-21 LAB — CUP PACEART INCLINIC DEVICE CHECK
Brady Statistic AP VP Percent: 44.71 %
Brady Statistic AS VS Percent: 0.03 %
Brady Statistic RA Percent Paced: 44.74 %
Brady Statistic RV Percent Paced: 99.94 %
Lead Channel Impedance Value: 1007 Ohm
Lead Channel Impedance Value: 437 Ohm
Lead Channel Impedance Value: 456 Ohm
Lead Channel Impedance Value: 532 Ohm
Lead Channel Impedance Value: 874 Ohm
Lead Channel Pacing Threshold Amplitude: 0.5 V
Lead Channel Pacing Threshold Amplitude: 0.75 V
Lead Channel Pacing Threshold Amplitude: 1.75 V
Lead Channel Pacing Threshold Pulse Width: 0.4 ms
Lead Channel Pacing Threshold Pulse Width: 0.8 ms
Lead Channel Sensing Intrinsic Amplitude: 16.6 mV
Lead Channel Setting Pacing Amplitude: 3 V
Lead Channel Setting Pacing Amplitude: 3.5 V
Lead Channel Setting Sensing Sensitivity: 4 mV
MDC IDC MSMT BATTERY VOLTAGE: 3.07 V
MDC IDC MSMT LEADCHNL LV IMPEDANCE VALUE: 1216 Ohm
MDC IDC MSMT LEADCHNL LV IMPEDANCE VALUE: 570 Ohm
MDC IDC MSMT LEADCHNL RA IMPEDANCE VALUE: 399 Ohm
MDC IDC MSMT LEADCHNL RA IMPEDANCE VALUE: 494 Ohm
MDC IDC MSMT LEADCHNL RA PACING THRESHOLD PULSEWIDTH: 0.4 ms
MDC IDC MSMT LEADCHNL RA SENSING INTR AMPL: 3.375 mV
MDC IDC SESS DTM: 20160223005120
MDC IDC SET LEADCHNL LV PACING PULSEWIDTH: 0.8 ms
MDC IDC SET LEADCHNL RA PACING AMPLITUDE: 3.5 V
MDC IDC SET LEADCHNL RV PACING PULSEWIDTH: 0.4 ms
MDC IDC SET ZONE DETECTION INTERVAL: 400 ms
MDC IDC STAT BRADY AP VS PERCENT: 0.02 %
MDC IDC STAT BRADY AS VP PERCENT: 55.23 %
Zone Setting Detection Interval: 400 ms

## 2015-01-21 NOTE — Progress Notes (Signed)
HPI Kim Mullins returns today for followup of her Biv PPM. She is a pleasant 81 yo woman with a h/o HTN, chronic systolic heart failure, s/p BiV PPM. She has done well in the interim. She denies chest pain or sob. She has been visiting the grandaughter she raised in [place]. No other complaints today. She has not been in the hospital.  Allergies  Allergen Reactions  . Hydrocodone Other (See Comments)    Took away all energy and made her stop eating food for short time  . Percocet [Oxycodone-Acetaminophen] Other (See Comments)    Too away all energy and made her stop eating food for short time  . Mercurial Derivatives   . Eggs Or Egg-Derived Products Hives and Rash  . Penicillins Hives and Rash  . Quinine Derivatives Hives and Rash     Current Outpatient Prescriptions  Medication Sig Dispense Refill  . aspirin EC 81 MG tablet Take 81 mg by mouth daily.    . bisacodyl (DULCOLAX) 5 MG EC tablet Take 5 mg by mouth daily as needed for mild constipation or moderate constipation.    . Calcium Carbonate-Vitamin D (CALCIUM + D PO) Take 1 tablet by mouth daily.    . carvedilol (COREG) 3.125 MG tablet Take 1 tablet (3.125 mg total) by mouth 2 (two) times daily with a meal. 60 tablet 5  . furosemide (LASIX) 40 MG tablet Take 1 tablet (40 mg total) by mouth 2 (two) times daily. 60 tablet 6  . lisinopril (PRINIVIL,ZESTRIL) 5 MG tablet Take 1 tablet (5 mg total) by mouth daily. 30 tablet 3  . polyethylene glycol (MIRALAX / GLYCOLAX) packet Take 17 g by mouth daily.     No current facility-administered medications for this visit.     Past Medical History  Diagnosis Date  . Bowel obstruction   . Left tibial fracture 2007  . Asthma     Allergixc reaction to cats.  . Anemia     occassionally  . Arthritis     all over.  . Diabetes mellitus 2006    Diet and exercise controlled.  Marland Kitchen GERD (gastroesophageal reflux disease)     occ  . Osteoarthritis of right shoulder region 06/26/2013  .  DCM (dilated cardiomyopathy) 08/16/2014    normal coronary arteries on cath with EF 30-45%   . Chronic systolic CHF (congestive heart failure)     EF 30-35% by cath  . Complete heart block     a. s/p MDT CRTP pacemaker  . OSA (obstructive sleep apnea) 10/23/2014    Moderate with AHI 21/hr    ROS:   All systems reviewed and negative except as noted in the HPI.   Past Surgical History  Procedure Laterality Date  . Abdominal hysterectomy  1969  . Dilation and curettage of uterus  1968  . Pilonidal cyst excision  1959  . Corn removal  1999    Bilateral feet  . Bunionectomy  1984    Bilateral  . Tonsillectomy  1942  . Eye surgery  1983    Surgery to fix Retainal detachment, bilateral  . Carpal tunnell  2004    Bilateral  . Total hip arthroplasty  11/16/2011    Procedure: TOTAL HIP ARTHROPLASTY ANTERIOR APPROACH;  Surgeon: Hessie Dibble, MD;  Location: [place];  Service: Orthopedics;  Laterality: Right;  DEPUY  . Total shoulder arthroplasty Right 06/26/2013    Procedure: TOTAL SHOULDER ARTHROPLASTY;  Surgeon: Johnny Bridge, MD;  Location:  [facility];  Service: Orthopedics;  Laterality: Right;  . Cataract extraction Right     early 2015  . Joint replacement    . Esophagogastroduodenoscopy (egd) with propofol N/A 06/11/2014    Procedure: ESOPHAGOGASTRODUODENOSCOPY (EGD) WITH PROPOFOL;  Surgeon: Inda Castle, MD;  Location: [facility];  Service: Endoscopy;  Laterality: N/A;  . Bravo ph study N/A 06/11/2014    Procedure: BRAVO [place] STUDY;  Surgeon: Inda Castle, MD;  Location: [facility];  Service: Endoscopy;  Laterality: N/A;  Venia Minks dilation  06/11/2014    Procedure: Venia Minks DILATION;  Surgeon: Inda Castle, MD;  Location: [facility];  Service: Endoscopy;;  . Left and right heart catheterization with coronary angiogram N/A 08/29/2014    Procedure: LEFT AND RIGHT HEART CATHETERIZATION WITH CORONARY ANGIOGRAM;  Surgeon: Peter M Martinique, MD;  Location: [facility] C[facility] LAB;   Service: Cardiovascular;  Laterality: N/A;  . Bi-ventricular pacemaker insertion N/A 10/07/2014    MDT CRTP implanted by Dr Lovena Le     Family History  Problem Relation Age of Onset  . Diabetes Maternal Grandfather   . Dementia Mother   . Coronary artery disease Mother   . Cancer Father     blood ? type  . Anuerysm Daughter     brain  . Colon cancer Neg Hx      History   Social History  . Marital Status: Widowed    Spouse Name: N/A  . Number of Children: 1  . Years of Education: N/A   Occupational History  . retired    Social History Main Topics  . Smoking status: Former Smoker -- 1.00 packs/day for 45 years    Types: Cigarettes    Quit date: 07/24/1978  . Smokeless tobacco: Never Used  . Alcohol Use: 4.2 oz/week    7 Glasses of wine per week  . Drug Use: No  . Sexual Activity: Not Currently   Other Topics Concern  . Not on file   Social History Narrative     BP 110/74 mmHg  Pulse 85  Ht 5\' 3"  (1.6 m)  Wt 175 lb 3.2 oz (79.47 kg)  BMI 31.04 kg/m2  Physical Exam:  Well appearing 81 yo woman, NAD HEENT: Unremarkable Neck:  No JVD, no thyromegally Back:  No CVA tenderness Lungs:  Clear with no wheezes HEART:  Regular rate rhythm, no murmurs, no rubs, no clicks Abd:  soft, positive bowel sounds, no organomegally, no rebound, no guarding Ext:  2 plus pulses, no edema, no cyanosis, no clubbing Skin:  No rashes no nodules Neuro:  CN II through XII intact, motor grossly intact  EKG - NSR with Biv Pacing  DEVICE  Normal device function.  See PaceArt for details.   Assess/Plan:

## 2015-01-21 NOTE — Assessment & Plan Note (Signed)
Her blood pressure is well controlled. She will continue her current meds.  

## 2015-01-21 NOTE — Patient Instructions (Signed)
Medication Instructions:  Your physician recommends that you continue on your current medications as directed. Please refer to the Current Medication list given to you today.   Labwork: NONE  Testing/Procedures: NONE  Follow-Up: Remote monitoring is used to monitor your Pacemaker or ICD from home. This monitoring reduces the number of office visits required to check your device to one time per year. It allows [place] to keep an eye on the functioning of your device to ensure it is working properly. You are scheduled for a device check from home on 04/22/2015. You may send your transmission at any time that day. If you have a wireless device, the transmission will be sent automatically. After your physician reviews your transmission, you will receive a postcard with your next transmission date.  Your physician wants you to follow-up in: 9 months with Dr. Lovena Le. You will receive a reminder letter in the mail two months in advance. If you don't receive a letter, please call our office to schedule the follow-up appointment.   Any Other Special Instructions Will Be Listed Below (If Applicable).

## 2015-01-21 NOTE — Assessment & Plan Note (Signed)
She has class 1 symptoms. She will continue her current meds.

## 2015-01-21 NOTE — Assessment & Plan Note (Signed)
Her medtronic BiV PM is working normally. Will recheck in several months.

## 2015-01-23 ENCOUNTER — Encounter: Payer: Self-pay | Admitting: *Deleted

## 2015-01-24 ENCOUNTER — Ambulatory Visit (INDEPENDENT_AMBULATORY_CARE_PROVIDER_SITE_OTHER): Payer: Medicare Other | Admitting: Cardiology

## 2015-01-24 ENCOUNTER — Encounter: Payer: Self-pay | Admitting: Cardiology

## 2015-01-24 VITALS — BP 92/62 | HR 76 | Ht 63.0 in | Wt 175.4 lb

## 2015-01-24 DIAGNOSIS — I5022 Chronic systolic (congestive) heart failure: Secondary | ICD-10-CM | POA: Diagnosis not present

## 2015-01-24 DIAGNOSIS — G4733 Obstructive sleep apnea (adult) (pediatric): Secondary | ICD-10-CM

## 2015-01-24 DIAGNOSIS — I10 Essential (primary) hypertension: Secondary | ICD-10-CM

## 2015-01-24 LAB — BASIC METABOLIC PANEL
BUN: 15 mg/dL (ref 6–23)
CO2: 30 meq/L (ref 19–32)
Calcium: 9.1 mg/dL (ref 8.4–10.5)
Chloride: 99 mEq/L (ref 96–112)
Creatinine, Ser: 1.27 mg/dL — ABNORMAL HIGH (ref 0.40–1.20)
GFR: 51.75 mL/min — AB (ref >60.00)
GLUCOSE: 100 mg/dL — AB (ref 70–99)
POTASSIUM: 4.2 meq/L (ref 3.5–5.1)
SODIUM: 134 meq/L — AB (ref 135–145)

## 2015-01-24 LAB — BRAIN NATRIURETIC PEPTIDE: Pro B Natriuretic peptide (BNP): 263 pg/mL — ABNORMAL HIGH (ref 0.0–100.0)

## 2015-01-24 MED ORDER — FUROSEMIDE 40 MG PO TABS: 40.0000 mg | ORAL_TABLET | Freq: Every day | ORAL | Status: DC

## 2015-01-24 NOTE — Patient Instructions (Signed)
Medication Instructions:  Your physician has recommended you make the following change in your medication:  1) STOP LISINOPRIL 2) DECREASE LASIX to 40 mg daily  Labwork: TODAY: BMET, BNP  Testing/Procedures: None  Follow-Up: Your physician recommends that you schedule a follow-up appointment in: 2 weeks with an APP.  Your physician wants you to follow-up in: 6 months with Dr. Radford Pax. You will receive a reminder letter in the mail two months in advance. If you don't receive a letter, please call our office to schedule the follow-up appointment.   Any Other Special Instructions Will Be Listed Below (If Applicable).

## 2015-01-24 NOTE — Progress Notes (Signed)
Cardiology Office Note   Date:  01/24/2015   ID:  ELANORA RAMSOUR, DOB [birth year], MRN [id number]  PCP:  Kristine Garbe, MD    Chief Complaint  Patient presents with  . Follow-up    OSA      History of Present Illness: Malayja Nicosia is a 81 y.o. female with a history of asthma, anemia, GERD with esophageal stricture s/p dilatation, DM diet controlled,  normal coronary arteries with severe LV dysfunction EF 30-35% with 2+ MR and mild to moderate pulmonary HTN by cath and OSA on CPAP. She had a sleep study done showing moderate OSA with an AHI of 21/hr and underwent BIPAP titration to 10/6cm H2O.  Repeat 2D echo 11/2014 on medical therapy showed improvement in EF to 50%.  She now presents today for followup.  She has not had any LE edema. Her SOB has improved unless she does strenuous exercise. She denies any LE edema, chest pain, dizziness or syncope.She is doing well with her BiPAP device.  She tolerates her full face mask and feels the pressure is adequate but thinks that her mask is leaking some. She still gets up in the middle of the night to urinate and also has pain in her shoulders.  She has to take pain meds at night and may feel groggy from it in the am.      Past Medical History  Diagnosis Date  . Bowel obstruction   . Left tibial fracture 2007  . Asthma     Allergixc reaction to cats.  . Anemia     occassionally  . Arthritis     all over.  . Diabetes mellitus 2006    Diet and exercise controlled.  Marland Kitchen GERD (gastroesophageal reflux disease)     occ  . Osteoarthritis of right shoulder region 06/26/2013  . DCM (dilated cardiomyopathy) 08/16/2014    normal coronary arteries on cath with EF 30-45%.  EF now 50% by echo 11/2014  . Chronic systolic CHF (congestive heart failure)     EF 30-35% by cath  . Complete heart block     a. s/p MDT CRTP pacemaker  . OSA (obstructive sleep apnea) 10/23/2014    Moderate with AHI 21/hr    Past Surgical History    Procedure Laterality Date  . Abdominal hysterectomy  1969  . Dilation and curettage of uterus  1968  . Pilonidal cyst excision  1959  . Corn removal  1999    Bilateral feet  . Bunionectomy  1984    Bilateral  . Tonsillectomy  1942  . Eye surgery  1983    Surgery to fix Retainal detachment, bilateral  . Carpal tunnell  2004    Bilateral  . Total hip arthroplasty  11/16/2011    Procedure: TOTAL HIP ARTHROPLASTY ANTERIOR APPROACH;  Surgeon: Hessie Dibble, MD;  Location: [facility];  Service: Orthopedics;  Laterality: Right;  DEPUY  . Total shoulder arthroplasty Right 06/26/2013    Procedure: TOTAL SHOULDER ARTHROPLASTY;  Surgeon: Johnny Bridge, MD;  Location: [facility];  Service: Orthopedics;  Laterality: Right;  . Cataract extraction Right     early 2015  . Joint replacement    . Esophagogastroduodenoscopy (egd) with propofol N/A 06/11/2014    Procedure: ESOPHAGOGASTRODUODENOSCOPY (EGD) WITH PROPOFOL;  Surgeon: Inda Castle, MD;  Location: [facility];  Service: Endoscopy;  Laterality: N/A;  . Bravo ph study N/A 06/11/2014    Procedure: BRAVO [place] STUDY;  Surgeon: Inda Castle, MD;  Location: [facility];  Service: Endoscopy;  Laterality: N/A;  Venia Minks dilation  06/11/2014    Procedure: Venia Minks DILATION;  Surgeon: Inda Castle, MD;  Location: [facility];  Service: Endoscopy;;  . Left and right heart catheterization with coronary angiogram N/A 08/29/2014    Procedure: LEFT AND RIGHT HEART CATHETERIZATION WITH CORONARY ANGIOGRAM;  Surgeon: Peter M Martinique, MD;  Location: [facility] C[facility];  Service: Cardiovascular;  Laterality: N/A;  . Bi-ventricular pacemaker insertion N/A 10/07/2014    MDT CRTP implanted by Dr Lovena Le  . Cardiac catheterization      normal coronary arteries     Current Outpatient Prescriptions  Medication Sig Dispense Refill  . aspirin EC 81 MG tablet Take 81 mg by mouth daily.    . bisacodyl (DULCOLAX) 5 MG EC tablet Take 5 mg by mouth daily as needed for mild  constipation or moderate constipation.    . Calcium Carbonate-Vitamin D (CALCIUM + D PO) Take 1 tablet by mouth daily.    . carvedilol (COREG) 3.125 MG tablet Take 1 tablet (3.125 mg total) by mouth 2 (two) times daily with a meal. 60 tablet 5  . furosemide (LASIX) 40 MG tablet Take 1 tablet (40 mg total) by mouth 2 (two) times daily. 60 tablet 6  . lisinopril (PRINIVIL,ZESTRIL) 5 MG tablet Take 1 tablet (5 mg total) by mouth daily. 30 tablet 3  . polyethylene glycol (MIRALAX / GLYCOLAX) packet Take 17 g by mouth daily.     No current facility-administered medications for this visit.    Allergies:   Hydrocodone; Percocet; Mercurial derivatives; Eggs or egg-derived products; Penicillins; and Quinine derivatives    Social History:  The patient  reports that she quit smoking about 36 years ago. Her smoking use included Cigarettes. She has a 45 pack-year smoking history. She has never used smokeless tobacco. She reports that she drinks about 4.2 oz of alcohol per week. She reports that she does not use illicit drugs.   Family History:  The patient's family history includes Anuerysm in her daughter; Cancer in her father; Coronary artery disease in her mother; Dementia in her mother; Diabetes in her maternal grandfather. There is no history of Colon cancer.    ROS:  Please see the history of present illness.   Otherwise, review of systems are positive for none.   All other systems are reviewed and negative.    PHYSICAL EXAM: VS:  BP 92/62 mmHg  Pulse 76  Ht 5\' 3"  (1.6 m)  Wt 175 lb 6.4 oz (79.561 kg)  BMI 31.08 kg/m2  SpO2 98% , BMI Body mass index is 31.08 kg/(m^2). GEN: Well nourished, well developed, in no acute distress HEENT: normal Neck: no JVD, carotid bruits, or masses Cardiac: RRR; no murmurs, rubs, or gallops,no edema  Respiratory:  clear to auscultation bilaterally, normal work of breathing GI: soft, nontender, nondistended, + BS MS: no deformity or atrophy Skin: warm and dry,  no rash Neuro:  Strength and sensation are intact Psych: euthymic mood, full affect   EKG:  EKG is not ordered today.    Recent Labs: 04/29/2014: TSH 1.46 10/06/2014: ALT 23; Magnesium 2.2 10/07/2014: Hemoglobin 11.6*; Platelets 196 12/10/2014: Pro B Natriuretic peptide (BNP) 270.0* 12/18/2014: BUN 28*; Creatinine 1.19; Potassium 4.1; Sodium 135    Lipid Panel No results found for: CHOL, TRIG, HDL, CHOLHDL, VLDL, LDLCALC, LDLDIRECT    Wt Readings from Last 3 Encounters:  01/24/15 175 lb 6.4 oz (79.561 kg)  01/21/15 175 lb 3.2 oz (  79.47 kg)  12/10/14 184 lb 9.6 oz (83.734 kg)      ASSESSMENT AND PLAN:  1. HTN - well controlled and soft this am.I have instructed her to stop her lisinopril and will continue Coreg 3. Nonischemic DCM with severe LV dysfunction EF 30-35% by cath and now improved to 50% by echo 11/2014 Continue  BBand stop ACE I due to low BP 4. Mild to moderate pulmonary HTN - most like multifactorial and Secondary to systolic CHF/MR/asthma/OSA. 5. Normal coronary arteries by cath 6. Chronic systolic CHF - appears euvolemic.  Continue Coreg and decrease Lasix to 40mg  daily.  I will have her followup with PA in 2 weeks. 7. CHB s/p PPM - followed by Dr. Lovena Le   8. OSA on CPAP therapy and doing well with her device. Her d/l today showed an AHI of 12.2/hr on BiPAP at 12/8cm H2O and 93% compliance in using more than 4 hours nightly.  I have encouraged her to try to avoid sleeping on her back.  I will get [facility] to check her mask for leak.  I will increase her BiPAP to 14/10cm H2O and recheck d/l in 2 week   9.  CHronic SOB multifactorial - I will check a BMET and BNP today  Current medicines are reviewed at length with the patient today.  The patient does not have concerns regarding medicines.  The following changes have been made:  no change  Labs/ tests ordered today include: see above assessment and plan No orders of the defined types were placed in this  encounter.     Disposition:   FU with me in 6 months   Signed, Sueanne Margarita, MD  01/24/2015 8:46 AM    [facility] [address], [place], [place]  [address] Phone: [phone]; Fax: [phone]

## 2015-02-03 ENCOUNTER — Encounter: Payer: Self-pay | Admitting: Internal Medicine

## 2015-02-06 ENCOUNTER — Encounter: Payer: Self-pay | Admitting: Cardiology

## 2015-02-06 ENCOUNTER — Other Ambulatory Visit: Payer: Self-pay | Admitting: Physician Assistant

## 2015-02-06 NOTE — Telephone Encounter (Signed)
Please review for refill. Thanks!  

## 2015-02-07 ENCOUNTER — Encounter: Payer: Self-pay | Admitting: Internal Medicine

## 2015-02-07 ENCOUNTER — Encounter: Payer: Self-pay | Admitting: Cardiology

## 2015-02-10 ENCOUNTER — Other Ambulatory Visit: Payer: Self-pay | Admitting: *Deleted

## 2015-02-10 ENCOUNTER — Encounter: Payer: Self-pay | Admitting: *Deleted

## 2015-02-10 DIAGNOSIS — G4733 Obstructive sleep apnea (adult) (pediatric): Secondary | ICD-10-CM

## 2015-02-12 ENCOUNTER — Ambulatory Visit (INDEPENDENT_AMBULATORY_CARE_PROVIDER_SITE_OTHER): Payer: Medicare Other | Admitting: Cardiology

## 2015-02-12 ENCOUNTER — Encounter: Payer: Self-pay | Admitting: Cardiology

## 2015-02-12 VITALS — BP 118/72 | HR 80 | Ht 63.0 in | Wt 173.0 lb

## 2015-02-12 DIAGNOSIS — Z8679 Personal history of other diseases of the circulatory system: Secondary | ICD-10-CM | POA: Diagnosis not present

## 2015-02-12 NOTE — Patient Instructions (Signed)
Medication Instructions:   REMAIN TO STAY OFF LISINOPRIL   Labwork:   Testing/Procedures:   Follow-Up:  Your physician wants you to follow-up in:  [place] will receive a reminder letter in the mail two months in advance. If you don't receive a letter, please call our office to schedule the follow-up appointment.   Any Other Special Instructions Will Be Listed Below (If Applicable).

## 2015-02-12 NOTE — Progress Notes (Signed)
02/12/2015 Kim Mullins   1929/09/21  [id number]  Primary Physician Kristine Garbe, MD Primary Cardiologist: Dr. Radford Pax  Reason for Visit/CC: Follow-Up for Symptomatic Hypotension.   HPI:  The patient is a 81 y/o female followed by Dr. Radford Pax, who presents to clinic for evaluation after medication adjustments were made at her last OV. She is a  81 y.o. female with a history of asthma, anemia, GERD with esophageal stricture s/p dilatation, DM diet controlled, normal coronary arteries with severe LV dysfunction with EF of 30-35% with 2+ MR and mild to moderate pulmonary HTN by cath and s/p BiV PPM which is followed by Dr. Lovena Le. She also has OSA, compliant with CPAP. most recent 2D echo 11/2014 showed improvement in systolic function with EF at 50-55%.  At her last OV with Dr. Radford Pax on 01/24/15, her BP was soft in the 0000000 systolic and she complained of dizziness.  Dr. Radford Pax instructed her to disontinue her lisinopril and to continue Coreg. Her Lasix was also decreased.   Today in follow up, she reports that she feels much better. No further dizziness. She denies syncope/ near syncope. No increased dyspnea after reducing her Lasix. No chest pain. Her BP is improved today at 118/72. Weight has remained stable.    Current Outpatient Prescriptions  Medication Sig Dispense Refill  . aspirin EC 81 MG tablet Take 81 mg by mouth daily.    . bisacodyl (DULCOLAX) 5 MG EC tablet Take 5 mg by mouth daily as needed for mild constipation or moderate constipation.    . Calcium Carbonate-Vitamin D (CALCIUM + D PO) Take 1 tablet by mouth daily.    . carvedilol (COREG) 3.125 MG tablet Take 1 tablet (3.125 mg total) by mouth 2 (two) times daily with a meal. 60 tablet 5  . furosemide (LASIX) 40 MG tablet Take 1 tablet (40 mg total) by mouth daily. 60 tablet 6  . polyethylene glycol (MIRALAX / GLYCOLAX) packet Take 17 g by mouth daily.     No current facility-administered medications for this visit.      Allergies  Allergen Reactions  . Hydrocodone Other (See Comments)    Took away all energy and made her stop eating food for short time  . Percocet [Oxycodone-Acetaminophen] Other (See Comments)    Too away all energy and made her stop eating food for short time  . Mercurial Derivatives   . Eggs Or Egg-Derived Products Hives and Rash  . Penicillins Hives and Rash  . Quinine Derivatives Hives and Rash    History   Social History  . Marital Status: Widowed    Spouse Name: N/A  . Number of Children: 1  . Years of Education: N/A   Occupational History  . retired    Social History Main Topics  . Smoking status: Former Smoker -- 1.00 packs/day for 45 years    Types: Cigarettes    Quit date: 07/24/1978  . Smokeless tobacco: Never Used  . Alcohol Use: 4.2 oz/week    7 Glasses of wine per week  . Drug Use: No  . Sexual Activity: Not Currently   Other Topics Concern  . Not on file   Social History Narrative     Review of Systems: General: negative for chills, fever, night sweats or weight changes.  Cardiovascular: negative for chest pain, dyspnea on exertion, edema, orthopnea, palpitations, paroxysmal nocturnal dyspnea or shortness of breath Dermatological: negative for rash Respiratory: negative for cough or wheezing Urologic: negative for hematuria Abdominal:  negative for nausea, vomiting, diarrhea, bright red blood per rectum, melena, or hematemesis Neurologic: negative for visual changes, syncope, or dizziness All other systems reviewed and are otherwise negative except as noted above.    Blood pressure 118/72, pulse 80, height 5\' 3"  (1.6 m), weight 173 lb (78.472 kg), SpO2 99 %.  General appearance: alert, cooperative and no distress Neck: no carotid bruit and no JVD Lungs: clear to auscultation bilaterally Heart: regular rate and rhythm, S1, S2 normal, no murmur, click, rub or gallop Extremities: no LEE Pulses: 2+ and symmetric Skin: warm and  dry Neurologic: Grossly normal  EKG Not performed  ASSESSMENT AND PLAN:   1. Borderline Hypotension: BP improved and normotensive after discontinuation of lisinopril. She continues on low dose Coreg BID and BP remains stable. No further symptoms of dizziness/ near syncope. BP 118/72 today.   2. Chronic Diastolic HF: stable after decrease in Lasix due to soft BP. No dyspnea. Euvolemic on physical exam. Continue current dose of Lasix.   3. OSA: compliant with CPAP  4. PPM:  Followed by Dr. Thad Ranger  Continue current plan of care. F/U with Dr. Radford Pax in 6 months.    Lyda Jester PA-C 02/12/2015 10:39 AM

## 2015-03-10 ENCOUNTER — Encounter (HOSPITAL_COMMUNITY): Payer: Self-pay | Admitting: Emergency Medicine

## 2015-03-10 ENCOUNTER — Emergency Department (INDEPENDENT_AMBULATORY_CARE_PROVIDER_SITE_OTHER): Payer: Medicare Other

## 2015-03-10 ENCOUNTER — Emergency Department (HOSPITAL_COMMUNITY)
Admission: EM | Admit: 2015-03-10 | Discharge: 2015-03-10 | Disposition: A | Discharge status: Home / Self Care | Payer: Medicare Other | Source: Home / Self Care

## 2015-03-10 DIAGNOSIS — M7502 Adhesive capsulitis of left shoulder: Secondary | ICD-10-CM

## 2015-03-10 NOTE — Discharge Instructions (Signed)
Cryotherapy °Cryotherapy means treatment with cold. Ice or gel packs can be used to reduce both pain and swelling. Ice is the most helpful within the first 24 to 48 hours after an injury or flare-up from overusing a muscle or joint. Sprains, strains, spasms, burning pain, shooting pain, and aches can all be eased with ice. Ice can also be used when recovering from surgery. Ice is effective, has very few side effects, and is safe for most people to use. °PRECAUTIONS  °Ice is not a safe treatment option for people with: °· Raynaud phenomenon. This is a condition affecting small blood vessels in the extremities. Exposure to cold may cause your problems to return. °· Cold hypersensitivity. There are many forms of cold hypersensitivity, including: °¨ Cold urticaria. Red, itchy hives appear on the skin when the tissues begin to warm after being iced. °¨ Cold erythema. This is a red, itchy rash caused by exposure to cold. °¨ Cold hemoglobinuria. Red blood cells break down when the tissues begin to warm after being iced. The hemoglobin that carry oxygen are passed into the urine because they cannot combine with blood proteins fast enough. °· Numbness or altered sensitivity in the area being iced. °If you have any of the following conditions, do not use ice until you have discussed cryotherapy with your caregiver: °· Heart conditions, such as arrhythmia, angina, or chronic heart disease. °· High blood pressure. °· Healing wounds or open skin in the area being iced. °· Current infections. °· Rheumatoid arthritis. °· Poor circulation. °· Diabetes. °Ice slows the blood flow in the region it is applied. This is beneficial when trying to stop inflamed tissues from spreading irritating chemicals to surrounding tissues. However, if you expose your skin to cold temperatures for too long or without the proper protection, you can damage your skin or nerves. Watch for signs of skin damage due to cold. °HOME CARE INSTRUCTIONS °Follow  these tips to use ice and cold packs safely. °· Place a dry or damp towel between the ice and skin. A damp towel will cool the skin more quickly, so you may need to shorten the time that the ice is used. °· For a more rapid response, add gentle compression to the ice. °· Ice for no more than 10 to 20 minutes at a time. The bonier the area you are icing, the less time it will take to get the benefits of ice. °· Check your skin after 5 minutes to make sure there are no signs of a poor response to cold or skin damage. °· Rest 20 minutes or more between uses. °· Once your skin is numb, you can end your treatment. You can test numbness by very lightly touching your skin. The touch should be so light that you do not see the skin dimple from the pressure of your fingertip. When using ice, most people will feel these normal sensations in this order: cold, burning, aching, and numbness. °· Do not use ice on someone who cannot communicate their responses to pain, such as small children or people with dementia. °HOW TO MAKE AN ICE PACK °Ice packs are the most common way to use ice therapy. Other methods include ice massage, ice baths, and cryosprays. Muscle creams that cause a cold, tingly feeling do not offer the same benefits that ice offers and should not be used as a substitute unless recommended by your caregiver. °To make an ice pack, do one of the following: °· Place crushed ice or a   bag of frozen vegetables in a sealable plastic bag. Squeeze out the excess air. Place this bag inside another plastic bag. Slide the bag into a pillowcase or place a damp towel between your skin and the bag.  Mix 3 parts water with 1 part rubbing alcohol. Freeze the mixture in a sealable plastic bag. When you remove the mixture from the freezer, it will be slushy. Squeeze out the excess air. Place this bag inside another plastic bag. Slide the bag into a pillowcase or place a damp towel between your skin and the bag. SEEK MEDICAL CARE  IF:  You develop white spots on your skin. This may give the skin a blotchy (mottled) appearance.  Your skin turns blue or pale.  Your skin becomes waxy or hard.  Your swelling gets worse. MAKE SURE YOU:   Understand these instructions.  Will watch your condition.  Will get help right away if you are not doing well or get worse. Document Released: 04/12/2011 Document Revised: 12/31/2013 Document Reviewed: 04/12/2011 [facility] Patient Information 2015 [place], [place]. This information is not intended to replace advice given to you by your health care provider. Make sure you discuss any questions you have with your health care provider. Adhesive Capsulitis Sometimes the shoulder becomes stiff and is painful to move. Some people say it feels as if the shoulder is frozen in place. Because of this, the condition is called "frozen shoulder." Its medical name is adhesive capsulitis.  The shoulder joint is made up of strong connective tissue that attaches the ball of the humerus to the shallow shoulder socket. This strong connective tissue is called the joint capsule. This tissue can become stiff and swollen. That is when adhesive capsulitis sets in. CAUSES  It is not always clear just what the cause adhesive capsulitis. Possibilities include:  Injury to the shoulder joint.  Strain. This is a repetitive injury brought about by overuse.  Lack of use. Perhaps your arm or hand was otherwise injured. It might have been in a sling for awhile. Or perhaps you were not using it to avoid pain.  Referred pain. This is a sort of trick the body plays. You feel pain in the shoulder. But, the pain actually comes from an injury somewhere else in the body.  Long-standing health problems. Several diseases can cause adhesive capsulitis. They include diabetes, heart disease, stroke, thyroid problems, rheumatoid arthritis and lung disease.  Being a women older than 20. Anyone can develop adhesive capsulitis but  it is most common in women in this age group. SYMPTOMS   Pain.  It occurs when the arm is moved.  Parts of the shoulder might hurt if they are touched.  Pain is worse at night or when resting.  Soreness. It might not be strong enough to be called pain. But, the shoulder aches.  The shoulder does not move freely.  Muscle spasms.  Trouble sleeping because of shoulder ache or pain. DIAGNOSIS  To decide if you have adhesive capsulitis, your healthcare provider will probably:  Ask about symptoms you have noticed.  Ask about your history of joint pain and anything that might have caused the pain.  Ask about your overall health.  Use hands to feel your shoulder and neck.  Ask you to move your shoulder in specific directions. This may indicate the origin of the pain.  Order imaging tests; pictures of the shoulder. They help pinpoint the source of the problem. An X-ray might be used. For more detail, an MRI  is often used. An MRI details the tendons, muscles and ligaments as well as the joint. TREATMENT  Adhesive capsulitis can be treated several ways. Most treatments can be done in a clinic or in your healthcare provider's office. Be sure to discuss the different options with your caregiver. They include:  Physical therapy. You will work on specific exercises to get your shoulder moving again. The exercises usually involve stretching. A physical therapist (a caregiver with special training) can show you what to do and what not to do. The exercises will need to be done daily.  Medication.  Over-the-counter medicines may relieve pain and inflammation (the body's way of reacting to injury or infection).  Corticosteroids. These are stronger drugs to reduce pain and inflammation. They are given by injection (shots) into the shoulder joint. Frequent treatment is not recommended.  Muscle relaxants. Medication may be prescribed to ease muscle spasms.  Treatment of underlying conditions.  This means treating another condition that is causing your shoulder problem. This might be a rotator cuff (tendon) problem  Shoulder manipulation. The shoulder will be moved by your healthcare provider. You would be under general anesthesia (given a drug that puts you to sleep). You would not feel anything. Sometimes the joint will be injected with salt water (saline) at high pressure to break down internal scarring in the joint capsule.  Surgery. This is rarely needed. It may be suggested in advanced cases after all other treatment has failed. PROGNOSIS  In time, most people recover from adhesive capsulitis. Sometimes, however, the pain goes away but full movement of the shoulder does not return.  HOME CARE INSTRUCTIONS   Take any pain medications recommended by your healthcare provider. Follow the directions carefully.  If you have physical therapy, follow through with the therapist's suggestions. Be sure you understand the exercises you will be doing. You should understand:  How often the exercises should be done.  How many times each exercise should be repeated.  How long they should be done.  What other activities you should do, or not do.  That you should warm up before doing any exercise. Just 5 to 10 minutes will help. Small, gentle movements should get your shoulder ready for more.  Avoid high-demand exercise that involves your shoulder such as throwing. This type of exercise can make pain worse.  Consider using cold packs. Cold may ease swelling and pain. Ask your healthcare provider if a cold pack might help you. If so, get directions on how and when to use them. SEEK MEDICAL CARE IF:   You have any questions about your medications.  Your pain continues to increase. Document Released: 06/13/2009 Document Revised: 11/08/2011 Document Reviewed: 06/13/2009 [facility] Patient Information 2015 [place], [place]. This information is not intended to replace advice given to you by your  health care provider. Make sure you discuss any questions you have with your health care provider.

## 2015-03-10 NOTE — ED Provider Notes (Signed)
CSN: [id number]     Arrival date & time 03/10/15  1317 History   None    Chief Complaint  Patient presents with  . Fall   (Consider location/radiation/quality/duration/timing/severity/associated sxs/prior Treatment)  HPI   Patient is an 81 year old female presenting today with complaints of left shoulder pain for approximately one week ago following a fall where she got her foot tangled in a purse on the floor and ended up falling onto linoleum directly onto her left shoulder. Patient states she was able to get up immediately with help, however has experienced discomfort and pain at rest and has had a limited range of motion since injury.  States she has a history of arthritis in both shoulders.  Replacement surgery in R shoulder.   Past Medical History  Diagnosis Date  . Bowel obstruction   . Left tibial fracture 2007  . Asthma     Allergixc reaction to cats.  . Anemia     occassionally  . Arthritis     all over.  . Diabetes mellitus 2006    Diet and exercise controlled.  Marland Kitchen GERD (gastroesophageal reflux disease)     occ  . Osteoarthritis of right shoulder region 06/26/2013  . DCM (dilated cardiomyopathy) 08/16/2014    normal coronary arteries on cath with EF 30-45%.  EF now 50% by echo 11/2014  . Chronic systolic CHF (congestive heart failure)     EF 30-35% by cath  . Complete heart block     a. s/p MDT CRTP pacemaker  . OSA (obstructive sleep apnea) 10/23/2014    Moderate with AHI 21/hr   Past Surgical History  Procedure Laterality Date  . Abdominal hysterectomy  1969  . Dilation and curettage of uterus  1968  . Pilonidal cyst excision  1959  . Corn removal  1999    Bilateral feet  . Bunionectomy  1984    Bilateral  . Tonsillectomy  1942  . Eye surgery  1983    Surgery to fix Retainal detachment, bilateral  . Carpal tunnell  2004    Bilateral  . Total hip arthroplasty  11/16/2011    Procedure: TOTAL HIP ARTHROPLASTY ANTERIOR APPROACH;  Surgeon: Hessie Dibble, MD;   Location: [place];  Service: Orthopedics;  Laterality: Right;  DEPUY  . Total shoulder arthroplasty Right 06/26/2013    Procedure: TOTAL SHOULDER ARTHROPLASTY;  Surgeon: Johnny Bridge, MD;  Location: [place];  Service: Orthopedics;  Laterality: Right;  . Cataract extraction Right     early 2015  . Joint replacement    . Esophagogastroduodenoscopy (egd) with propofol N/A 06/11/2014    Procedure: ESOPHAGOGASTRODUODENOSCOPY (EGD) WITH PROPOFOL;  Surgeon: Inda Castle, MD;  Location[facility];  Service: Endoscopy;  Laterality: N/A;  . Bravo ph study N/A 06/11/2014    Procedure: BRAVO [place] STUDY;  Surgeon: Inda Castle, MD;  Location: [facility];  Service: Endoscopy;  Laterality: N/A;  Venia Minks dilation  06/11/2014    Procedure: Venia Minks DILATION;  Surgeon: Inda Castle, MD;  Location: [facility];  Service: Endoscopy;;  . Left and right heart catheterization with coronary angiogram N/A 08/29/2014    Procedure: LEFT AND RIGHT HEART CATHETERIZATION WITH CORONARY ANGIOGRAM;  Surgeon: Peter M Martinique, MD;  Location: [facility] CATH LAB;  Service: Cardiovascular;  Laterality: N/A;  . Bi-ventricular pacemaker insertion N/A 10/07/2014    MDT CRTP implanted by Dr Lovena Le  . Cardiac catheterization      normal coronary arteries   Family History  Problem  Relation Age of Onset  . Diabetes Maternal Grandfather   . Dementia Mother   . Coronary artery disease Mother   . Cancer Father     blood ? type  . Anuerysm Daughter     brain  . Colon cancer Neg Hx    History  Substance Use Topics  . Smoking status: Former Smoker -- 1.00 packs/day for 45 years    Types: Cigarettes    Quit date: 07/24/1978  . Smokeless tobacco: Never Used  . Alcohol Use: 4.2 oz/week    7 Glasses of wine per week   OB History    No data available     Review of Systems  Constitutional: Negative.  Negative for fever, chills and fatigue.  HENT: Negative.   Eyes: Negative.   Respiratory: Negative.  Negative for chest  tightness and shortness of breath.   Cardiovascular: Negative.  Negative for chest pain.  Gastrointestinal: Negative.  Negative for nausea, vomiting and diarrhea.  Endocrine: Negative.   Genitourinary: Negative.   Musculoskeletal: Negative.  Negative for joint swelling, neck pain and neck stiffness.  Skin: Negative.  Negative for wound.  Allergic/Immunologic: Negative.   Neurological: Negative.  Negative for dizziness.  Hematological: Negative.   Psychiatric/Behavioral: Negative.     Allergies  Hydrocodone; Percocet; Mercurial derivatives; Eggs or egg-derived products; Penicillins; and Quinine derivatives  Home Medications   Prior to Admission medications   Medication Sig Start Date End Date Taking? Authorizing Provider  aspirin EC 81 MG tablet Take 81 mg by mouth daily.    Historical Provider, MD  bisacodyl (DULCOLAX) 5 MG EC tablet Take 5 mg by mouth daily as needed for mild constipation or moderate constipation.    Historical Provider, MD  Calcium Carbonate-Vitamin D (CALCIUM + D PO) Take 1 tablet by mouth daily.    Historical Provider, MD  carvedilol (COREG) 3.125 MG tablet Take 1 tablet (3.125 mg total) by mouth 2 (two) times daily with a meal. 10/08/14   Almyra Deforest, PA  furosemide (LASIX) 40 MG tablet Take 1 tablet (40 mg total) by mouth daily. 01/24/15   Sueanne Margarita, MD  polyethylene glycol (MIRALAX / GLYCOLAX) packet Take 17 g by mouth daily.    Historical Provider, MD   BP 108/61 mmHg  Pulse 81  Temp(Src) 98 F (36.7 C) (Oral)  Resp 16  SpO2 99%   Physical Exam  Constitutional: She is oriented to person, place, and time. She appears well-developed and well-nourished. No distress.  Eyes: Pupils are equal, round, and reactive to light.  Neck: Normal range of motion. Neck supple.  Cardiovascular: Normal rate, normal heart sounds and intact distal pulses.  Exam reveals no gallop and no friction rub.   No murmur heard. Pulmonary/Chest: Effort normal and breath sounds normal.  No respiratory distress. She has no wheezes. She has no rales. She exhibits no tenderness.  Musculoskeletal: She exhibits no edema or tenderness.       Left shoulder: She exhibits decreased range of motion and pain. She exhibits no tenderness, no bony tenderness, no swelling, no effusion, no crepitus, no deformity, no laceration, no spasm, normal pulse and normal strength.  Patient presents in no apparent distress.  There is obvious asymmetry of the left shoulder as compared to the right shoulder as it rests lower. No surface trauma or ecchymosis. Crepitus is not present.  There is no bony deformity or prominence of the humeral head. No erythema, warmth, or swelling appreciated.  The patient has no tenderness with  palpation of the clavicle, AC joint, and acromion process. No tenderness over the scapula or humeral head.  No tenderness to palpation of the musculature. There is pain and limitation noted with active and passive abduction and adduction; and internal and external rotation with flexion and extension. Negative drop arm test. Normal sensation over the deltoid and ability to flex elbow intact.  Distal motor and neurovascular status is intact.Bilateral upper extremity strength 5/5. Color, movement, and distal sensation intact. Cap refill less than 3 seconds to distal phalanges bilaterally.     Lymphadenopathy:    She has no cervical adenopathy.  Neurological: She is alert and oriented to person, place, and time. She exhibits normal muscle tone. Coordination normal.  Skin: Skin is warm and dry. She is not diaphoretic.  Intact, no breaks in skin, bruising or swelling noted.  Nursing note and vitals reviewed.   ED Course  Procedures (including critical care time) Labs Review Labs Reviewed - No data to display  Imaging Review Dg Shoulder Left  03/10/2015   CLINICAL DATA:  Fall 6 days ago with persistent pain in the left shoulder. Initial encounter.  EXAM: LEFT SHOULDER - 2+ VIEW  COMPARISON:   None.  FINDINGS: No acute fracture or dislocation is identified. Prominent inferior acromial spur present as well as proliferative changes involving the AC joint. There is loss of glenohumeral joint space with significant proliferative changes involving the humeral head. No bony lesions or destruction identified. Soft tissues are unremarkable.  IMPRESSION: No acute findings. Prominent degenerative changes involving the [facility] joint, acromion and glenohumeral joint.   Electronically Signed   By: Aletta Edouard M.D.   On: 03/10/2015 15:49     MDM   1. Frozen shoulder syndrome, left    Plan of care was discussed with Dr. Juventino Slovak.  Patient given a sling immobilizer for comfort only and asked not to wear it 24/7.  Patient to follow-up with orthopedics this week to evaluate rotator cuff and possible soft tissue injury/therapy.  Ice, ibuprofen or tylenol for discomfort. The patient verbalizes understanding and agrees to plan of care.       Nehemiah Settle, NP 03/10/15 [phone]

## 2015-03-10 NOTE — ED Notes (Signed)
C/o fall States patient fall on Tuesday 03/04/15 Did hurt left shoulder Used tylenol as tx

## 2015-03-10 NOTE — ED Notes (Signed)
Fitted w sling. No questions from patient. She will follow up w the ortho of her choice

## 2015-03-12 ENCOUNTER — Encounter: Payer: Self-pay | Admitting: Cardiology

## 2015-03-13 ENCOUNTER — Encounter: Payer: Self-pay | Admitting: *Deleted

## 2015-03-13 ENCOUNTER — Telehealth: Payer: Self-pay | Admitting: *Deleted

## 2015-03-13 DIAGNOSIS — G4733 Obstructive sleep apnea (adult) (pediatric): Secondary | ICD-10-CM

## 2015-03-13 NOTE — Telephone Encounter (Signed)
Orders have been placed. Message sent to patient.  [place] notified

## 2015-03-13 NOTE — Telephone Encounter (Signed)
-----   Message from Sueanne Margarita, MD sent at 03/13/2015  3:46 PM EDT ----- Mask leak needs to be addressed by DME and then repeat 2 week autotitration on BiPAP from 4 to 20cm H2O

## 2015-04-07 ENCOUNTER — Other Ambulatory Visit: Payer: Self-pay | Admitting: Cardiology

## 2015-04-22 ENCOUNTER — Ambulatory Visit (INDEPENDENT_AMBULATORY_CARE_PROVIDER_SITE_OTHER): Payer: Medicare Other | Admitting: *Deleted

## 2015-04-22 DIAGNOSIS — I5022 Chronic systolic (congestive) heart failure: Secondary | ICD-10-CM

## 2015-04-22 DIAGNOSIS — I442 Atrioventricular block, complete: Secondary | ICD-10-CM | POA: Diagnosis not present

## 2015-04-22 NOTE — Progress Notes (Signed)
Remote pacemaker transmission.   

## 2015-04-25 ENCOUNTER — Encounter: Payer: Self-pay | Admitting: Cardiology

## 2015-05-02 LAB — CUP PACEART REMOTE DEVICE CHECK
Battery Remaining Longevity: 59 mo
Battery Voltage: 3 V
Brady Statistic AP VS Percent: 0.04 %
Brady Statistic AS VP Percent: 76.12 %
Brady Statistic RA Percent Paced: 23.66 %
Lead Channel Impedance Value: 1216 Ohm
Lead Channel Impedance Value: 361 Ohm
Lead Channel Impedance Value: 513 Ohm
Lead Channel Impedance Value: 513 Ohm
Lead Channel Impedance Value: 532 Ohm
Lead Channel Impedance Value: 817 Ohm
Lead Channel Pacing Threshold Pulse Width: 0.4 ms
Lead Channel Pacing Threshold Pulse Width: 0.8 ms
Lead Channel Sensing Intrinsic Amplitude: 2.75 mV
Lead Channel Sensing Intrinsic Amplitude: 2.75 mV
Lead Channel Sensing Intrinsic Amplitude: 6.25 mV
Lead Channel Sensing Intrinsic Amplitude: 6.25 mV
Lead Channel Setting Pacing Amplitude: 2.5 V
Lead Channel Setting Pacing Amplitude: 2.75 V
Lead Channel Setting Pacing Pulse Width: 0.4 ms
Lead Channel Setting Pacing Pulse Width: 0.8 ms
Lead Channel Setting Sensing Sensitivity: 4 mV
MDC IDC MSMT LEADCHNL LV IMPEDANCE VALUE: 627 Ohm
MDC IDC MSMT LEADCHNL LV IMPEDANCE VALUE: 912 Ohm
MDC IDC MSMT LEADCHNL LV PACING THRESHOLD AMPLITUDE: 1.375 V
MDC IDC MSMT LEADCHNL RA PACING THRESHOLD AMPLITUDE: 0.75 V
MDC IDC MSMT LEADCHNL RA PACING THRESHOLD PULSEWIDTH: 0.4 ms
MDC IDC MSMT LEADCHNL RV IMPEDANCE VALUE: 456 Ohm
MDC IDC MSMT LEADCHNL RV PACING THRESHOLD AMPLITUDE: 0.375 V
MDC IDC SESS DTM: 20160823173628
MDC IDC SET LEADCHNL RA PACING AMPLITUDE: 2 V
MDC IDC STAT BRADY AP VP PERCENT: 23.62 %
MDC IDC STAT BRADY AS VS PERCENT: 0.22 %
MDC IDC STAT BRADY RV PERCENT PACED: 99.74 %
Zone Setting Detection Interval: 400 ms
Zone Setting Detection Interval: 400 ms

## 2015-05-12 ENCOUNTER — Other Ambulatory Visit: Payer: Self-pay | Admitting: Physician Assistant

## 2015-05-12 NOTE — Telephone Encounter (Signed)
Please review for refill. Thanks!  

## 2015-05-14 ENCOUNTER — Encounter: Payer: Self-pay | Admitting: Cardiology

## 2015-05-22 ENCOUNTER — Encounter: Payer: Self-pay | Admitting: Internal Medicine

## 2015-05-30 ENCOUNTER — Encounter: Payer: Self-pay | Admitting: Cardiology

## 2015-06-18 ENCOUNTER — Emergency Department (HOSPITAL_COMMUNITY): Payer: Medicare Other

## 2015-06-18 ENCOUNTER — Inpatient Hospital Stay (HOSPITAL_COMMUNITY)
Admission: EM | Admit: 2015-06-18 | Discharge: 2015-06-20 | DRG: 062 | Disposition: A | Discharge status: Home / Self Care | Payer: Medicare Other | Attending: Neurology | Admitting: Neurology

## 2015-06-18 ENCOUNTER — Inpatient Hospital Stay (HOSPITAL_COMMUNITY): Payer: Medicare Other

## 2015-06-18 ENCOUNTER — Encounter (HOSPITAL_COMMUNITY): Payer: Self-pay | Admitting: Neurology

## 2015-06-18 DIAGNOSIS — Z8673 Personal history of transient ischemic attack (TIA), and cerebral infarction without residual deficits: Secondary | ICD-10-CM | POA: Diagnosis present

## 2015-06-18 DIAGNOSIS — Z79899 Other long term (current) drug therapy: Secondary | ICD-10-CM | POA: Diagnosis not present

## 2015-06-18 DIAGNOSIS — Z885 Allergy status to narcotic agent status: Secondary | ICD-10-CM | POA: Diagnosis not present

## 2015-06-18 DIAGNOSIS — I639 Cerebral infarction, unspecified: Principal | ICD-10-CM | POA: Diagnosis present

## 2015-06-18 DIAGNOSIS — K219 Gastro-esophageal reflux disease without esophagitis: Secondary | ICD-10-CM | POA: Diagnosis present

## 2015-06-18 DIAGNOSIS — Z6831 Body mass index (BMI) 31.0-31.9, adult: Secondary | ICD-10-CM | POA: Diagnosis not present

## 2015-06-18 DIAGNOSIS — Z880 Allergy status to penicillin: Secondary | ICD-10-CM

## 2015-06-18 DIAGNOSIS — H53469 Homonymous bilateral field defects, unspecified side: Secondary | ICD-10-CM | POA: Insufficient documentation

## 2015-06-18 DIAGNOSIS — Z96641 Presence of right artificial hip joint: Secondary | ICD-10-CM | POA: Diagnosis present

## 2015-06-18 DIAGNOSIS — H53461 Homonymous bilateral field defects, right side: Secondary | ICD-10-CM | POA: Diagnosis present

## 2015-06-18 DIAGNOSIS — G4733 Obstructive sleep apnea (adult) (pediatric): Secondary | ICD-10-CM | POA: Diagnosis present

## 2015-06-18 DIAGNOSIS — Z96611 Presence of right artificial shoulder joint: Secondary | ICD-10-CM | POA: Diagnosis present

## 2015-06-18 DIAGNOSIS — E669 Obesity, unspecified: Secondary | ICD-10-CM | POA: Diagnosis present

## 2015-06-18 DIAGNOSIS — Z8249 Family history of ischemic heart disease and other diseases of the circulatory system: Secondary | ICD-10-CM

## 2015-06-18 DIAGNOSIS — I63332 Cerebral infarction due to thrombosis of left posterior cerebral artery: Secondary | ICD-10-CM

## 2015-06-18 DIAGNOSIS — Z7982 Long term (current) use of aspirin: Secondary | ICD-10-CM

## 2015-06-18 DIAGNOSIS — Z950 Presence of cardiac pacemaker: Secondary | ICD-10-CM

## 2015-06-18 DIAGNOSIS — Z87891 Personal history of nicotine dependence: Secondary | ICD-10-CM

## 2015-06-18 DIAGNOSIS — I420 Dilated cardiomyopathy: Secondary | ICD-10-CM | POA: Diagnosis present

## 2015-06-18 DIAGNOSIS — J45909 Unspecified asthma, uncomplicated: Secondary | ICD-10-CM | POA: Diagnosis present

## 2015-06-18 DIAGNOSIS — I5022 Chronic systolic (congestive) heart failure: Secondary | ICD-10-CM | POA: Diagnosis present

## 2015-06-18 DIAGNOSIS — Z91018 Allergy to other foods: Secondary | ICD-10-CM | POA: Diagnosis not present

## 2015-06-18 DIAGNOSIS — I110 Hypertensive heart disease with heart failure: Secondary | ICD-10-CM | POA: Diagnosis present

## 2015-06-18 DIAGNOSIS — E119 Type 2 diabetes mellitus without complications: Secondary | ICD-10-CM | POA: Diagnosis present

## 2015-06-18 HISTORY — DX: Essential (primary) hypertension: I10

## 2015-06-18 LAB — I-STAT CHEM 8, ED
BUN: 20 mg/dL (ref 6–20)
CALCIUM ION: 1.04 mmol/L — AB (ref 1.13–1.30)
CREATININE: 1.1 mg/dL — AB (ref 0.44–1.00)
Chloride: 105 mmol/L (ref 101–111)
GLUCOSE: 103 mg/dL — AB (ref 65–99)
HCT: 41 % (ref 36.0–46.0)
HEMOGLOBIN: 13.9 g/dL (ref 12.0–15.0)
Potassium: 4.5 mmol/L (ref 3.5–5.1)
Sodium: 140 mmol/L (ref 135–145)
TCO2: 22 mmol/L (ref 0–100)

## 2015-06-18 LAB — DIFFERENTIAL
Basophils Absolute: 0.1 10*3/uL (ref 0.0–0.1)
Basophils Relative: 1 %
EOS ABS: 0.1 10*3/uL (ref 0.0–0.7)
EOS PCT: 3 %
LYMPHS ABS: 1.8 10*3/uL (ref 0.7–4.0)
LYMPHS PCT: 38 %
MONO ABS: 0.4 10*3/uL (ref 0.1–1.0)
Monocytes Relative: 8 %
NEUTROS PCT: 50 %
Neutro Abs: 2.4 10*3/uL (ref 1.7–7.7)

## 2015-06-18 LAB — COMPREHENSIVE METABOLIC PANEL
ALBUMIN: 3.5 g/dL (ref 3.5–5.0)
ALK PHOS: 58 U/L (ref 38–126)
ALT: 10 U/L — ABNORMAL LOW (ref 14–54)
ANION GAP: 12 (ref 5–15)
AST: 21 U/L (ref 15–41)
BILIRUBIN TOTAL: 0.6 mg/dL (ref 0.3–1.2)
BUN: 16 mg/dL (ref 6–20)
CALCIUM: 8.9 mg/dL (ref 8.9–10.3)
CO2: 22 mmol/L (ref 22–32)
Chloride: 106 mmol/L (ref 101–111)
Creatinine, Ser: 1.1 mg/dL — ABNORMAL HIGH (ref 0.44–1.00)
GFR calc non Af Amer: 45 mL/min — ABNORMAL LOW (ref >60)
GFR, EST AFRICAN AMERICAN: 53 mL/min — AB (ref >60)
GLUCOSE: 103 mg/dL — AB (ref 65–99)
POTASSIUM: 4.6 mmol/L (ref 3.5–5.1)
SODIUM: 140 mmol/L (ref 135–145)
TOTAL PROTEIN: 7.2 g/dL (ref 6.5–8.1)

## 2015-06-18 LAB — GLUCOSE, CAPILLARY
GLUCOSE-CAPILLARY: 49 mg/dL — AB (ref 65–99)
GLUCOSE-CAPILLARY: 52 mg/dL — AB (ref 65–99)
GLUCOSE-CAPILLARY: 151 mg/dL — AB (ref 65–99)
Glucose-Capillary: 80 mg/dL (ref 65–99)

## 2015-06-18 LAB — CBC
HCT: 37.3 % (ref 36.0–46.0)
Hemoglobin: 11.7 g/dL — ABNORMAL LOW (ref 12.0–15.0)
MCH: 28.1 pg (ref 26.0–34.0)
MCHC: 31.4 g/dL (ref 30.0–36.0)
MCV: 89.7 fL (ref 78.0–100.0)
PLATELETS: 232 10*3/uL (ref 150–400)
RBC: 4.16 MIL/uL (ref 3.87–5.11)
RDW: 14.2 % (ref 11.5–15.5)
WBC: 4.8 10*3/uL (ref 4.0–10.5)

## 2015-06-18 LAB — I-STAT TROPONIN, ED: Troponin i, poc: 0.1 ng/mL (ref 0.00–0.08)

## 2015-06-18 LAB — PROTIME-INR
INR: 1.12 (ref 0.00–1.49)
PROTHROMBIN TIME: 14.6 s (ref 11.6–15.2)

## 2015-06-18 LAB — CBG MONITORING, ED: GLUCOSE-CAPILLARY: 82 mg/dL (ref 65–99)

## 2015-06-18 LAB — APTT: aPTT: 30 seconds (ref 24–37)

## 2015-06-18 MED ORDER — PANTOPRAZOLE SODIUM 40 MG IV SOLR
40.0000 mg | Freq: Every day | INTRAVENOUS | Status: DC
  Administered 2015-06-18 – 2015-06-19 (×2): 40 mg via INTRAVENOUS
  Filled 2015-06-18 (×2): qty 40

## 2015-06-18 MED ORDER — FUROSEMIDE 40 MG PO TABS
40.0000 mg | ORAL_TABLET | Freq: Every day | ORAL | Status: DC
  Administered 2015-06-19 – 2015-06-20 (×2): 40 mg via ORAL
  Filled 2015-06-18 (×2): qty 1

## 2015-06-18 MED ORDER — ALTEPLASE (STROKE) FULL DOSE INFUSION
0.9000 mg/kg | INTRAVENOUS | Status: AC
  Administered 2015-06-18: 73 mg via INTRAVENOUS
  Filled 2015-06-18: qty 73

## 2015-06-18 MED ORDER — SENNOSIDES-DOCUSATE SODIUM 8.6-50 MG PO TABS: 1.0000 | ORAL_TABLET | Freq: Every evening | ORAL | Status: DC | PRN

## 2015-06-18 MED ORDER — LABETALOL HCL 5 MG/ML IV SOLN: 10.0000 mg | INTRAVENOUS | Status: DC | PRN

## 2015-06-18 MED ORDER — SODIUM CHLORIDE 0.9 % IV SOLN
INTRAVENOUS | Status: DC
  Administered 2015-06-18 – 2015-06-19 (×2): via INTRAVENOUS

## 2015-06-18 MED ORDER — ACETAMINOPHEN 325 MG PO TABS
650.0000 mg | ORAL_TABLET | Freq: Four times a day (QID) | ORAL | Status: DC | PRN
  Administered 2015-06-18 – 2015-06-20 (×3): 650 mg via ORAL
  Filled 2015-06-18 (×3): qty 2

## 2015-06-18 MED ORDER — STROKE: EARLY STAGES OF RECOVERY BOOK
Freq: Once | Status: AC
  Administered 2015-06-18: 18:00:00

## 2015-06-18 MED ORDER — CARVEDILOL 3.125 MG PO TABS
3.1250 mg | ORAL_TABLET | Freq: Two times a day (BID) | ORAL | Status: DC
  Administered 2015-06-18 – 2015-06-20 (×4): 3.125 mg via ORAL
  Filled 2015-06-18 (×5): qty 1

## 2015-06-18 MED ORDER — SODIUM CHLORIDE 0.9 % IV SOLN
Freq: Once | INTRAVENOUS | Status: AC
  Administered 2015-06-18: 15:00:00 via INTRAVENOUS

## 2015-06-18 NOTE — ED Notes (Signed)
Pt brought straight back to room for MD to evaluate with right sided peripheral field cut.

## 2015-06-18 NOTE — H&P (Signed)
H&P    Chief Complaint: Code stroke  HPI:                                                                                                                                         Kim Mullins is an 81 y.o. female who was at church doing yoga today and felt normal. At 1200 she left her yoga class and noted she could not see out the right aspect of her vision.  She drove to her opthalmologist and she was sent to [facility] for fear of stroke.  While in ED code stroke was called due to right hemianopsia. She was within the window and tPA was administered due to visual field cut.    Date last known well: Date: 06/18/2015 Time last known well: Time: 12:00 tPA Given: Yes Modified Rankin: Rankin Score=0    Past Medical History  Diagnosis Date  . Bowel obstruction ([place])   . Left tibial fracture 2007  . Asthma     Allergixc reaction to cats.  . Anemia     occassionally  . Arthritis     all over.  . Diabetes mellitus 2006    Diet and exercise controlled.  Marland Kitchen GERD (gastroesophageal reflux disease)     occ  . Osteoarthritis of right shoulder region 06/26/2013  . DCM (dilated cardiomyopathy) ([place]) 08/16/2014    normal coronary arteries on cath with EF 30-45%.  EF now 50% by echo 11/2014  . Chronic systolic CHF (congestive heart failure) ([facility])     EF 30-35% by cath  . Complete heart block ([facility])     a. s/p MDT CRTP pacemaker  . OSA (obstructive sleep apnea) 10/23/2014    Moderate with AHI 21/hr    Past Surgical History  Procedure Laterality Date  . Abdominal hysterectomy  1969  . Dilation and curettage of uterus  1968  . Pilonidal cyst excision  1959  . Corn removal  1999    Bilateral feet  . Bunionectomy  1984    Bilateral  . Tonsillectomy  1942  . Eye surgery  1983    Surgery to fix Retainal detachment, bilateral  . Carpal tunnell  2004    Bilateral  . Total hip arthroplasty  11/16/2011    Procedure: TOTAL HIP ARTHROPLASTY ANTERIOR APPROACH;  Surgeon: Hessie Dibble, MD;  Location: [place];  Service: Orthopedics;  Laterality: Right;  DEPUY  . Total shoulder arthroplasty Right 06/26/2013    Procedure: TOTAL SHOULDER ARTHROPLASTY;  Surgeon: Johnny Bridge, MD;  Location: [place];  Service: Orthopedics;  Laterality: Right;  . Cataract extraction Right     early 2015  . Joint replacement    . Esophagogastroduodenoscopy (egd) with propofol N/A 06/11/2014    Procedure: ESOPHAGOGASTRODUODENOSCOPY (EGD) WITH PROPOFOL;  Surgeon: Inda Castle, MD;  [facility];  Service: Endoscopy;  Laterality: N/A;  . Bravo ph  study N/A 06/11/2014    Procedure: BRAVO [place] STUDY;  Surgeon: Inda Castle, MD;  Location: [facility];  Service: Endoscopy;  Laterality: N/A;  Venia Minks dilation  06/11/2014    Procedure: Venia Minks DILATION;  Surgeon: Inda Castle, MD;  Location: [facility];  Service: Endoscopy;;  . Left and right heart catheterization with coronary angiogram N/A 08/29/2014    Procedure: LEFT AND RIGHT HEART CATHETERIZATION WITH CORONARY ANGIOGRAM;  Surgeon: Peter M Martinique, MD;  Location: [facility] CATH [facility];  Service: Cardiovascular;  Laterality: N/A;  . Bi-ventricular pacemaker insertion N/A 10/07/2014    MDT CRTP implanted by Dr Lovena Le  . Cardiac catheterization      normal coronary arteries    Family History  Problem Relation Age of Onset  . Diabetes Maternal Grandfather   . Dementia Mother   . Coronary artery disease Mother   . Cancer Father     blood ? type  . Anuerysm Daughter     brain  . Colon cancer Neg Hx    Social History:  reports that she quit smoking about 36 years ago. Her smoking use included Cigarettes. She has a 45 pack-year smoking history. She has never used smokeless tobacco. She reports that she drinks about 4.2 oz of alcohol per week. She reports that she does not use illicit drugs.  Allergies:  Allergies  Allergen Reactions  . Hydrocodone Other (See Comments)    Took away all energy and made her stop eating food for  short time  . Percocet [Oxycodone-Acetaminophen] Other (See Comments)    Too away all energy and made her stop eating food for short time  . Mercurial Derivatives   . Eggs Or Egg-Derived Products Hives and Rash  . Penicillins Hives and Rash  . Quinine Derivatives Hives and Rash    Medications:                                                                                                                           Current Facility-Administered Medications  Medication Dose Route Frequency Provider Last Rate Last Dose  . alteplase (ACTIVASE) 1 mg/mL infusion 73 mg  0.9 mg/kg Intravenous STAT Wallie Char      . carvedilol (COREG) tablet 3.125 mg  3.125 mg Oral BID WC Marliss Coots, PA-C      . furosemide (LASIX) tablet 40 mg  40 mg Oral Daily Marliss Coots, PA-C       Current Outpatient Prescriptions  Medication Sig Dispense Refill  . aspirin EC 81 MG tablet Take 81 mg by mouth daily.    . bisacodyl (DULCOLAX) 5 MG EC tablet Take 5 mg by mouth daily as needed for mild constipation or moderate constipation.    . Calcium Carbonate-Vitamin D (CALCIUM + D PO) Take 1 tablet by mouth daily.    . carvedilol (COREG) 3.125 MG tablet TAKE 1 TABLET BY MOUTH 2 TIMES DAILY WITH A MEAL. 60 tablet 9  . furosemide (LASIX)  40 MG tablet TAKE 1 TABLET (40 MG TOTAL) BY MOUTH DAILY. 30 tablet 5  . polyethylene glycol (MIRALAX / GLYCOLAX) packet Take 17 g by mouth daily.       ROS:                                                                                                                                       History obtained from the patient  General ROS: negative for - chills, fatigue, fever, night sweats, weight gain or weight loss Psychological ROS: negative for - behavioral disorder, hallucinations, memory difficulties, mood swings or suicidal ideation Ophthalmic ROS: negative for - blurry vision, double vision, eye pain or loss of vision ENT ROS: negative for - epistaxis, nasal discharge, oral  lesions, sore throat, tinnitus or vertigo Allergy and Immunology ROS: negative for - hives or itchy/watery eyes Hematological and Lymphatic ROS: negative for - bleeding problems, bruising or swollen lymph nodes Endocrine ROS: negative for - galactorrhea, hair pattern changes, polydipsia/polyuria or temperature intolerance Respiratory ROS: negative for - cough, hemoptysis, shortness of breath or wheezing Cardiovascular ROS: negative for - chest pain, dyspnea on exertion, edema or irregular heartbeat Gastrointestinal ROS: negative for - abdominal pain, diarrhea, hematemesis, nausea/vomiting or stool incontinence Genito-Urinary ROS: negative for - dysuria, hematuria, incontinence or urinary frequency/urgency Musculoskeletal ROS: negative for - joint swelling or muscular weakness Neurological ROS: as noted in HPI Dermatological ROS: negative for rash and skin lesion changes  Neurologic Examination:                                                                                                      Blood pressure 134/71, pulse 72, temperature 98.7 F (37.1 C), temperature source Oral, resp. rate 16, height 5\' 3"  (1.6 m), weight 81.1 kg (178 lb 12.7 oz), SpO2 99 %.  HEENT-  Normocephalic, no lesions, without obvious abnormality.  Normal external eye and conjunctiva.  Normal TM's bilaterally.  Normal auditory canals and external ears. Normal external nose, mucus membranes and septum.  Normal pharynx. Cardiovascular- S1, S2 normal, pulses palpable throughout   Lungs- chest clear, no wheezing, rales, normal symmetric air entry Abdomen- normal findings: bowel sounds normal Extremities- no joint deformities, effusion, or inflammation Lymph-no adenopathy palpable Musculoskeletal-no joint tenderness, deformity or swelling Skin-warm and dry, no hyperpigmentation, vitiligo, or suspicious lesions  Neurological Examination Mental Status: Alert, oriented, thought content appropriate.  Speech fluent without  evidence of aphasia.  Able to follow 3 step commands without difficulty. Cranial Nerves:  II: Discs flat bilaterally; Visual fields with right hemia\nopsia, pupils equal, round, reactive to light and accommodation III,IV, VI: ptosis not present, extra-ocular motions intact bilaterally V,VII: smile symmetric with resting left facial droop (adentulus), facial light touch sensation normal bilaterally VIII: hearing normal bilaterally IX,X: uvula rises symmetrically XI: bilateral shoulder shrug XII: midline tongue extension Motor: Right : Upper extremity   5/5    Left:     Upper extremity   5/5  Lower extremity   5/5     Lower extremity   5/5 Tone and bulk:normal tone throughout; no atrophy noted Sensory: Pinprick and light touch intact throughout, bilaterally Deep Tendon Reflexes: 21+ and symmetric throughout UE no LE DTR Plantars: Mute bilaterally Cerebellar: normal finger-to-nose, and normal heel-to-shin test on the right but ataxic on the left Gait: not tested       Lab Results: Basic Metabolic Panel:  Recent Labs Lab 06/18/15 1513  NA 140  K 4.5  CL 105  GLUCOSE 103*  BUN 20  CREATININE 1.10*    Liver Function Tests: No results for input(s): AST, ALT, ALKPHOS, BILITOT, PROT, ALBUMIN in the last 168 hours. No results for input(s): LIPASE, AMYLASE in the last 168 hours. No results for input(s): AMMONIA in the last 168 hours.  CBC:  Recent Labs Lab 06/18/15 1503 06/18/15 1513  WBC 4.8  --   NEUTROABS 2.4  --   HGB 11.7* 13.9  HCT 37.3 41.0  MCV 89.7  --   PLT 232  --     Cardiac Enzymes: No results for input(s): CKTOTAL, CKMB, CKMBINDEX, TROPONINI in the last 168 hours.  Lipid Panel: No results for input(s): CHOL, TRIG, HDL, CHOLHDL, VLDL, LDLCALC in the last 168 hours.  CBG:  Recent Labs Lab 06/18/15 1529  GLUCAP 82    Microbiology: Results for orders placed or performed during the hospital encounter of 10/06/14  MRSA PCR Screening     Status:  None   Collection Time: 10/06/14  6:36 PM  Result Value Ref Range Status   MRSA by PCR NEGATIVE NEGATIVE Final    Comment:        The GeneXpert MRSA Assay (FDA approved for NASAL specimens only), is one component of a comprehensive MRSA colonization surveillance program. It is not intended to diagnose MRSA infection nor to guide or monitor treatment for MRSA infections.     Coagulation Studies:  Recent Labs  06/18/15 1503  LABPROT 14.6  INR 1.12    Imaging: Ct Head Wo Contrast  06/18/2015  CLINICAL DATA:  Code stroke with right upper vision cut. EXAM: CT HEAD WITHOUT CONTRAST TECHNIQUE: Contiguous axial images were obtained from the base of the skull through the vertex without intravenous contrast. COMPARISON:  03/06/2013 FINDINGS: Skull and Sinuses:Negative for fracture or destructive process. The mastoids, middle ears, and imaged paranasal sinuses are clear. Orbits: Right cataract resection. No acute finding or explanation for vision CT. Brain: No evidence of acute infarction, hemorrhage, hydrocephalus, or mass lesion/mass effect. Intracranial atherosclerosis, which likely accounts for the prominent density of the left vertebral artery (which has asymmetric greater atherosclerotic calcification), appearance similar to 2014. Normal cerebral volume for age. Mild and expected chronic ischemic changes in the cerebral white matter. Critical Value/emergent results were called by telephone at the time of interpretation on 06/18/2015 at 3:30 pm to Dr. Nicole Kindred, who verbally acknowledged these results. IMPRESSION: No intracranial hemorrhage or visible infarct. Electronically Signed   By: Monte Fantasia M.D.   On: 06/18/2015 15:33  Assessment and plan discussed with with attending physician and they are in agreement.    Etta Quill PA-C [facility] [phone]  06/18/2015, 3:37 PM   Assessment: 81 y.o. female presenting to ED with right Upper>lower field cut of  sudden onset, likely left PCA acute ischemic stroke.  tPA was administered due to this being a debilitating symptoms in a independent person who still drives and cares for herself. Patient will be admitted to [facility] and observed over night while stroke work up continues.   Stroke Risk Factors - diabetes mellitus   Plan: 1. HgbA1c, fasting lipid panel 2. MRI, MRA  of the brain without contrast 3. PT consult, OT consult, Speech consult 4. Echocardiogram 5. Carotid dopplers 6. Prophylactic therapy-None 7. Risk factor modification 8. Telemetry monitoring 9. Frequent neuro checks 10 NPO until passes stroke swallow screen 11 frequent neuro checks 12 stroke team to follow in AM  I personally participated in this patient's evaluation and management, including formulating the above clinical impression and management recommendations.  Rush Farmer M.D. [facility] [phone]

## 2015-06-18 NOTE — ED Provider Notes (Signed)
CSN: [id number]     Arrival date & time 06/18/15  1439 History   First MD Initiated Contact with Patient 06/18/15 1457     Chief Complaint  Patient presents with  . Visual Field Change     (Consider location/radiation/quality/duration/timing/severity/associated sxs/prior Treatment) Patient is a 81 y.o. female presenting with eye problem.  Eye Problem Location:  Both Quality: no pain, blurred/darkening vision on right field. Severity:  Moderate Onset quality:  Sudden Timing:  Constant Progression:  Unchanged Chronicity:  New Associated symptoms: blurred vision and decreased vision   Associated symptoms: no discharge, no double vision, no headaches, no nausea, no numbness, no photophobia, no redness, no swelling, no tingling, no vomiting and no weakness     Past Medical History  Diagnosis Date  . Bowel obstruction ([place])   . Left tibial fracture 2007  . Asthma     Allergixc reaction to cats.  . Anemia     occassionally  . Arthritis     all over.  . Diabetes mellitus 2006    Diet and exercise controlled.  Marland Kitchen GERD (gastroesophageal reflux disease)     occ  . Osteoarthritis of right shoulder region 06/26/2013  . DCM (dilated cardiomyopathy) ([place]) 08/16/2014    normal coronary arteries on cath with EF 30-45%.  EF now 50% by echo 11/2014  . Chronic systolic CHF (congestive heart failure) ([facility])     EF 30-35% by cath  . Complete heart block ([facility])     a. s/p MDT CRTP pacemaker  . OSA (obstructive sleep apnea) 10/23/2014    Moderate with AHI 21/hr   Past Surgical History  Procedure Laterality Date  . Abdominal hysterectomy  1969  . Dilation and curettage of uterus  1968  . Pilonidal cyst excision  1959  . Corn removal  1999    Bilateral feet  . Bunionectomy  1984    Bilateral  . Tonsillectomy  1942  . Eye surgery  1983    Surgery to fix Retainal detachment, bilateral  . Carpal tunnell  2004    Bilateral  . Total hip arthroplasty  11/16/2011    Procedure: TOTAL HIP  ARTHROPLASTY ANTERIOR APPROACH;  Surgeon: Hessie Dibble, MD;  Location: [place];  Service: Orthopedics;  Laterality: Right;  DEPUY  . Total shoulder arthroplasty Right 06/26/2013    Procedure: TOTAL SHOULDER ARTHROPLASTY;  Surgeon: Johnny Bridge, MD;  Location: [place];  Service: Orthopedics;  Laterality: Right;  . Cataract extraction Right     early 2015  . Joint replacement    . Esophagogastroduodenoscopy (egd) with propofol N/A 06/11/2014    Procedure: ESOPHAGOGASTRODUODENOSCOPY (EGD) WITH PROPOFOL;  Surgeon: Inda Castle, MD;  Location: [facility];  Service: Endoscopy;  Laterality: N/A;  . Bravo ph study N/A 06/11/2014    Procedure: BRAVO [place] STUDY;  Surgeon: Inda Castle, MD;  Location: [facility];  Service: Endoscopy;  Laterality: N/A;  Venia Minks dilation  06/11/2014    Procedure: Venia Minks DILATION;  Surgeon: Inda Castle, MD;  Location: [facility];  Service: Endoscopy;;  . Left and right heart catheterization with coronary angiogram N/A 08/29/2014    Procedure: LEFT AND RIGHT HEART CATHETERIZATION WITH CORONARY ANGIOGRAM;  Surgeon: Peter M Martinique, MD;  Location: [facility] C[facility];  Service: Cardiovascular;  Laterality: N/A;  . Bi-ventricular pacemaker insertion N/A 10/07/2014    MDT CRTP implanted by Dr Lovena Le  . Cardiac catheterization      normal coronary arteries   Family History  Problem Relation  Age of Onset  . Diabetes Maternal Grandfather   . Dementia Mother   . Coronary artery disease Mother   . Cancer Father     blood ? type  . Anuerysm Daughter     brain  . Colon cancer Neg Hx    Social History  Substance Use Topics  . Smoking status: Former Smoker -- 1.00 packs/day for 45 years    Types: Cigarettes    Quit date: 07/24/1978  . Smokeless tobacco: Never Used  . Alcohol Use: 4.2 oz/week    7 Glasses of wine per week   OB History    No data available     Review of Systems  Eyes: Positive for blurred vision and visual disturbance. Negative for double  vision, photophobia, pain, discharge and redness.  Gastrointestinal: Negative for nausea and vomiting.  Neurological: Negative for tingling, tremors, syncope, speech difficulty, weakness, light-headedness, numbness and headaches.  All other systems reviewed and are negative.    Allergies  Hydrocodone; Percocet; Mercurial derivatives; Eggs or egg-derived products; Penicillins; and Quinine derivatives  Home Medications   Prior to Admission medications   Medication Sig Start Date End Date Taking? Authorizing Provider  aspirin EC 81 MG tablet Take 81 mg by mouth daily.    Historical Provider, MD  bisacodyl (DULCOLAX) 5 MG EC tablet Take 5 mg by mouth daily as needed for mild constipation or moderate constipation.    Historical Provider, MD  Calcium Carbonate-Vitamin D (CALCIUM + D PO) Take 1 tablet by mouth daily.    Historical Provider, MD  carvedilol (COREG) 3.125 MG tablet TAKE 1 TABLET BY MOUTH 2 TIMES DAILY WITH A MEAL. 05/13/15   Sueanne Margarita, MD  furosemide (LASIX) 40 MG tablet TAKE 1 TABLET (40 MG TOTAL) BY MOUTH DAILY. 04/07/15   Sueanne Margarita, MD  polyethylene glycol (MIRALAX / GLYCOLAX) packet Take 17 g by mouth daily.    Historical Provider, MD   BP 126/77 mmHg  Pulse 69  Temp(Src) 98.7 F (37.1 C) (Oral)  Resp 16  SpO2 98% Physical Exam  Constitutional: She is oriented to person, place, and time. She appears well-developed and well-nourished. No distress.  HENT:  Head: Normocephalic.  Eyes: Pupils are equal, round, and reactive to light.  Neck: Normal range of motion.  Cardiovascular: Normal rate.   Pulmonary/Chest: Effort normal. No respiratory distress. She has no wheezes.  Abdominal: Soft. Bowel sounds are normal. She exhibits no distension. There is no tenderness.  Neurological: She is alert and oriented to person, place, and time. A cranial nerve deficit is present. She exhibits normal muscle tone. Coordination normal.  Right sided homonymous hemianopsia. Otherwise  CN II-XII intact  Skin: Skin is warm. She is not diaphoretic. No erythema.  Psychiatric: She has a normal mood and affect. Her behavior is normal. Thought content normal.  Nursing note and vitals reviewed.   ED Course  Procedures (including critical care time) Labs Review Labs Reviewed  CBC - Abnormal; Notable for the following:    Hemoglobin 11.7 (*)    All other components within normal limits  COMPREHENSIVE METABOLIC PANEL - Abnormal; Notable for the following:    Glucose, Bld 103 (*)    Creatinine, Ser 1.10 (*)    ALT 10 (*)    GFR calc non Af Amer 45 (*)    GFR calc Af Amer 53 (*)    All other components within normal limits  GLUCOSE, CAPILLARY - Abnormal; Notable for the following:    Glucose-Capillary  49 (*)    All other components within normal limits  GLUCOSE, CAPILLARY - Abnormal; Notable for the following:    Glucose-Capillary 52 (*)    All other components within normal limits  GLUCOSE, CAPILLARY - Abnormal; Notable for the following:    Glucose-Capillary 151 (*)    All other components within normal limits  I-STAT TROPOININ, ED - Abnormal; Notable for the following:    Troponin i, poc 0.10 (*)    All other components within normal limits  I-STAT CHEM 8, ED - Abnormal; Notable for the following:    Creatinine, Ser 1.10 (*)    Glucose, Bld 103 (*)    Calcium, Ion 1.04 (*)    All other components within normal limits  PROTIME-INR  APTT  DIFFERENTIAL  GLUCOSE, CAPILLARY  HEMOGLOBIN A1C  LIPID PANEL  CBG MONITORING, ED    Imaging Review Ct Head Wo Contrast  06/18/2015  CLINICAL DATA:  Code stroke with right upper vision cut. EXAM: CT HEAD WITHOUT CONTRAST TECHNIQUE: Contiguous axial images were obtained from the base of the skull through the vertex without intravenous contrast. COMPARISON:  03/06/2013 FINDINGS: Skull and Sinuses:Negative for fracture or destructive process. The mastoids, middle ears, and imaged paranasal sinuses are clear. Orbits: Right  cataract resection. No acute finding or explanation for vision CT. Brain: No evidence of acute infarction, hemorrhage, hydrocephalus, or mass lesion/mass effect. Intracranial atherosclerosis, which likely accounts for the prominent density of the left vertebral artery (which has asymmetric greater atherosclerotic calcification), appearance similar to 2014. Normal cerebral volume for age. Mild and expected chronic ischemic changes in the cerebral white matter. Critical Value/emergent results were called by telephone at the time of interpretation on 06/18/2015 at 3:30 pm to Dr. Nicole Kindred, who verbally acknowledged these results. IMPRESSION: No intracranial hemorrhage or visible infarct. Electronically Signed   By: Monte Fantasia M.D.   On: 06/18/2015 15:33   I have personally reviewed and evaluated these images and lab results as part of my medical decision-making.   EKG Interpretation   Date/Time:  Wednesday June 18 2015 15:23:24 EDT Ventricular Rate:  71 PR Interval:  158 QRS Duration: 120 QT Interval:  465 QTC Calculation: 505 R Axis:   -109 Text Interpretation:  Sinus rhythm IVCD, consider atypical RBBB Left  ventricular hypertrophy Anterolateral infarct, old Prolonged QT interval  Baseline wander in lead(s) II III aVF no significant change since Feb 2016  Confirmed by Regenia Skeeter  MD, Trainer ([id number]) on 06/18/2015 3:42:40 PM      MDM   Patient presents with right sided homonomous hemianopsia that occurred at 12:00 PM - 12:30PM. She was sent over here by her eye doctor. Patient taking ASA but no anticoagulation.  No other neurological deficits. Concern for occipital infarct. Code stroke called and patient taken to CT.   Neurology to administer TPA and admit. Patient admitted without further incident.   Final diagnoses:  CVA (cerebral infarction)  CVA (cerebral infarction)  CVA (cerebral infarction)  CVA (cerebral infarction)    Roberto Scales, MD 06/19/15 [place],  MD 07/16/15 1421

## 2015-06-18 NOTE — Significant Event (Signed)
Hypoglycemic Event  CBG: 49  Treatment: 15 GM carbohydrate snack  Symptoms: Hungry  Follow-up CBG: [id number] CBG Result:52    Treatment: 15 GM carbohydrate snack  Symptoms: Hungry  Follow-up CBG: [id number] CBG Result:80  Possible Reasons for Event: Inadequate meal intake  Comments/MD notified: Patient appears asymptomatic, she is alert and oriented, denied any dizziness at this time. Will continue to monitor.    Sanjuana Mae

## 2015-06-18 NOTE — Progress Notes (Signed)
Pt had and MRI ordered, which was noted pt had a pacemaker.  Upon investigation, it was determined that while the pacer leads are MRI conditional, the generator is NOT MRI compatible.  This patient cannot have an MRI or will result in severe injury to the patient or the device.  This exam will be cancelled, thank you.

## 2015-06-18 NOTE — Code Documentation (Signed)
81yo female arriving to [facility] via private vehicle at [phone].  Patient was at church for a yoga class when she went to leave and go to her car.  At 1230 she noticed trouble seeing on the right side.  Patient presented to her eye doctor who sent her to the ED.  Code Stroke called in the ED.  Stroke team to the bedside.  Patient to CT.  NIHSS 3, see documentation for details and code stroke times.  Patient with right upper quadrant visual field cut, left leg discoordination and initially missed the month.  Dr. Nicole Kindred to the bedside to discuss plan of care.  Treatment with tPA discussed with patient and patient agreeable.  Pharmacy at the bedside and notified to mix tPA.  tPA delivered to the bedside.  BP in the tPA parameters.  8mg  tPA bolus given over 1 minute at 1541 followed by 71mg /hr per pharmacy instructions.  Shortly after bolus administration this RN assessed total dose to be 73mg .  This was reconfirmed by calculating the dose with the patient's weight.  Dr. Nicole Kindred made aware, no new orders.  Pharmacy made aware, infusion changed to the correct rate of 66mg /hr per pharmacy.  Patient monitored frequently per post-tPA protocol.  Patient reporting mild headache 2/10, Dr. Leonie Man at the bedside and made aware, no new orders, will continue to assess.  Patient to be admitted to stroke unit.  Bedside handoff with ED RN Santiago Glad.

## 2015-06-18 NOTE — ED Notes (Signed)
Pt reports today at 69 noticed, blurry vision to right peripheral vision. Pt went to eye MD and was sent here. Pt is a x 4. Denies pain but is still blurry to right peripheral vision. No other neuro deficits.

## 2015-06-18 NOTE — Progress Notes (Addendum)
Patient arrived to room 5M20 from ED alert and oriented. She denied any visual loss at this time.Sjhe also denied any headache. She passed swallow eval. Safety precautions and orders reviewed with patient. Per ED nurse Santiago Glad, q15 minutes x2hrs is completed. Q30 minutes started now. TELE applied and confirmed. Will continue to monitor.   Ave Filter, RN

## 2015-06-19 ENCOUNTER — Ambulatory Visit (HOSPITAL_COMMUNITY): Payer: Medicare Other

## 2015-06-19 ENCOUNTER — Inpatient Hospital Stay (HOSPITAL_COMMUNITY): Payer: Medicare Other

## 2015-06-19 DIAGNOSIS — I639 Cerebral infarction, unspecified: Secondary | ICD-10-CM

## 2015-06-19 LAB — LIPID PANEL
CHOLESTEROL: 110 mg/dL (ref 0–200)
HDL: 49 mg/dL (ref >40)
LDL Cholesterol: 50 mg/dL (ref 0–99)
Total CHOL/HDL Ratio: 2.2 RATIO
Triglycerides: 55 mg/dL (ref <150)
VLDL: 11 mg/dL (ref 0–40)

## 2015-06-19 LAB — GLUCOSE, CAPILLARY
GLUCOSE-CAPILLARY: 98 mg/dL (ref 65–99)
GLUCOSE-CAPILLARY: 75 mg/dL (ref 65–99)
Glucose-Capillary: 111 mg/dL — ABNORMAL HIGH (ref 65–99)
Glucose-Capillary: 114 mg/dL — ABNORMAL HIGH (ref 65–99)

## 2015-06-19 MED ORDER — ASPIRIN EC 325 MG PO TBEC
325.0000 mg | DELAYED_RELEASE_TABLET | Freq: Every day | ORAL | Status: DC
  Administered 2015-06-19 – 2015-06-20 (×2): 325 mg via ORAL
  Filled 2015-06-19 (×2): qty 1

## 2015-06-19 NOTE — Progress Notes (Signed)
*  PRELIMINARY RESULTS* Vascular Ultrasound Carotid Duplex (Doppler) has been completed.   Findings suggest 1-39% internal carotid artery stenosis bilaterally. Vertebral arteries are patent with antegrade flow.  06/19/2015 6:52 PM Maudry Mayhew, RVT, RDCS, RDMS

## 2015-06-19 NOTE — Progress Notes (Addendum)
STROKE TEAM PROGRESS NOTE   HISTORY Kim Mullins is an 81 y.o. female who was at church doing yoga today and felt normal. At 1200 she left her yoga class and noted she could not see out the right aspect of her vision (LKW 1200 06/18/2015). She drove to her opthalmologist and she was sent to [facility] for fear of stroke. While in ED code stroke was called due to right hemianopsia. She was within the window and tPA was administered due to visual field cut. Modified Rankin: Rankin Score=0  She was admitted to 48M for further evaluation and treatment.   SUBJECTIVE (INTERVAL HISTORY) No family is at the bedside.  Overall she feels her condition is rapidly improving. She feels she is doing good. Anxious to leave to go to [place] for her 22 highschool class reunion.    OBJECTIVE Temp:  [97.9 F (36.6 C)-99 F (37.2 C)] 99 F (37.2 C) (10/20 1130) Pulse Rate:  [64-83] 66 (10/20 1130) Cardiac Rhythm:  [-] Ventricular paced;Bundle branch block (10/20 0700) Resp:  [15-23] 20 (10/20 1130) BP: (99-142)/(48-109) 105/75 mmHg (10/20 1130) SpO2:  [97 %-100 %] 100 % (10/20 1130) Weight:  [81.1 kg (178 lb 12.7 oz)] 81.1 kg (178 lb 12.7 oz) (10/19 1500)  CBC:  Recent Labs Lab 06/18/15 1503 06/18/15 1513  WBC 4.8  --   NEUTROABS 2.4  --   HGB 11.7* 13.9  HCT 37.3 41.0  MCV 89.7  --   PLT 232  --     Basic Metabolic Panel:  Recent Labs Lab 06/18/15 1503 06/18/15 1513  NA 140 140  K 4.6 4.5  CL 106 105  CO2 22  --   GLUCOSE 103* 103*  BUN 16 20  CREATININE 1.10* 1.10*  CALCIUM 8.9  --     Lipid Panel:    Component Value Date/Time   CHOL 110 06/19/2015 0357   TRIG 55 06/19/2015 0357   HDL 49 06/19/2015 0357   CHOLHDL 2.2 06/19/2015 0357   VLDL 11 06/19/2015 0357   LDLCALC 50 06/19/2015 0357   HgbA1c: No results found for: HGBA1C Urine Drug Screen: No results found for: LABOPIA, COCAINSCRNUR, LABBENZ, AMPHETMU, THCU, LABBARB    IMAGING  Ct Head Wo  Contrast  06/19/2015   1. Stable atrophy and white matter disease. 2. No acute or evolving infarct. 3. Atherosclerosis.   06/18/2015   No intracranial hemorrhage or visible infarct. Electronically Signed   By: Monte Fantasia M.D.   On: 06/18/2015 15:33    PHYSICAL EXAM Pleasant obese elderly African-American lady not in distress. . Afebrile. Head is nontraumatic. Neck is supple without bruit.    Cardiac exam no murmur or gallop. Lungs are clear to auscultation. Distal pulses are well felt. Neurological Exam ;  Awake alert oriented 3 with normal speech and language function. Intact attention, registration and recall. Extraocular moments are full range without nystagmus. Fundi were not visualized. Visual field testing shows a right upper quadrantanopsia. Face is symmetric without weakness. Tongue is midline. Motor system exam reveals no upper or lower extremity drift. No focal weakness. Sensory system exam shows symmetric and equal sensation bilaterally. Deep tendon reflexes are 1+ symmetric. Plantars are downgoing. Coordination is accurate. Gait was not tested. ASSESSMENT/PLAN Kim Mullins is a 81 y.o. female with history of diabetes, chronic systolic CHF, complete heart block, OSA presenting with right hemianopsia. She received IV t-PA.   Stroke, L brain, s/p IV tPA. Stroke not seen on CT  MRI  MRA  Pacer  Repeat CT at 24h without acute stroke, no hemorrhage  Carotid Doppler  pending   2D Echo  pending   LDL 50  HgbA1c pending  SCDs for VTE prophylaxis  Diet heart healthy/carb modified Room service appropriate?: Yes; Fluid consistency:: Thin  aspirin 81 mg daily prior to admission, now on No antithrombotic  Ongoing aggressive stroke risk factor management  Therapy recommendations:  Pending. Ok to be OOB. Orders written  Disposition:  Anticipate return home  Hypertension  Stable  Diabetes  HgbA1c pending, goal < 7.0  Other Stroke Risk Factors  Advanced  age  Former Cigarette smoker, quit smoking 36 years ago   Obesity, Body mass index is 31.68 kg/(m^2).   Obstructive sleep apnea, on CPAP at home  Hospital day # [place] for Pager information 06/19/2015 5:08 PM   I have personally examined this patient, reviewed notes, independently viewed imaging studies, participated in medical decision making and plan of care. I have made any additions or clarifications directly to the above note. Agree with note above.  She presented with sudden onset of right-sided peripheral vision loss likely due to embolic left posterior cerebral artery infarct. She was treated with IV TPA and remains at risk for neurological worsening, recurrent stroke, TIA needs ongoing stroke evaluation and aggressive risk factor modification. Mobilize out of bed. Strict control of hypertension. Check MRI scan, echocardiogram, carotid Dopplers.  Antony Contras, MD Medical Director [place] Pager: [phone] 06/19/2015 5:24 PM   To contact Stroke Continuity provider, please refer to [email or web address] After hours, contact General Neurology While lying total 81 year old yoga class

## 2015-06-19 NOTE — Care Management Note (Signed)
Case Management Note  Patient Details  Name: Kim Mullins MRN: [id number] Date of Birth: 01/13/1930  Subjective/Objective:                    Action/Plan: Patient was admitted with visual field cut. Received IV TPA on admission.  Patient lives at home alone. Will follow for discharge needs pending PT/OT evals and physician orders.  Expected Discharge Date:                  Expected Discharge Plan:     In-House Referral:     Discharge planning Services     Post Acute Care Choice:    Choice offered to:     DME Arranged:    DME Agency:     HH Arranged:    HH Agency:     Status of Service:  In process, will continue to follow  Medicare Important Message Given:    Date Medicare IM Given:    Medicare IM give by:    Date Additional Medicare IM Given:    Additional Medicare Important Message give by:     If discussed at [place] of Stay Meetings, dates discussed:    Additional Comments:  Rolm Baptise, RN 06/19/2015, 11:31 AM

## 2015-06-19 NOTE — Progress Notes (Signed)
PT Cancellation Note  Patient Details Name: Kim Mullins MRN: [id number] DOB: 1930-05-16   Cancelled Treatment:    Reason Eval/Treat Not Completed: Patient not medically ready. Noted that pt still on strict bed rest at this time. Please update activity orders when pt is ready to participate with PT.    Rolinda Roan 06/19/2015, 7:51 AM   Rolinda Roan, PT, DPT Acute Rehabilitation Services Pager: [phone]

## 2015-06-20 ENCOUNTER — Inpatient Hospital Stay (HOSPITAL_COMMUNITY): Payer: Medicare Other

## 2015-06-20 ENCOUNTER — Encounter (HOSPITAL_COMMUNITY): Payer: Self-pay | Admitting: Radiology

## 2015-06-20 LAB — GLUCOSE, CAPILLARY
GLUCOSE-CAPILLARY: 132 mg/dL — AB (ref 65–99)
Glucose-Capillary: 90 mg/dL (ref 65–99)

## 2015-06-20 LAB — HEMOGLOBIN A1C
HEMOGLOBIN A1C: 5.8 % — AB (ref 4.8–5.6)
Mean Plasma Glucose: 120 mg/dL

## 2015-06-20 MED ORDER — CLOPIDOGREL BISULFATE 75 MG PO TABS
75.0000 mg | ORAL_TABLET | Freq: Every day | ORAL | Status: DC
  Administered 2015-06-20: 75 mg via ORAL
  Filled 2015-06-20: qty 1

## 2015-06-20 MED ORDER — IOHEXOL 350 MG/ML SOLN
50.0000 mL | Freq: Once | INTRAVENOUS | Status: AC | PRN
  Administered 2015-06-20: 50 mL via INTRAVENOUS

## 2015-06-20 MED ORDER — ASPIRIN 325 MG PO TBEC: 325.0000 mg | DELAYED_RELEASE_TABLET | Freq: Every day | ORAL | Status: DC

## 2015-06-20 MED ORDER — CLOPIDOGREL BISULFATE 75 MG PO TABS: 75.0000 mg | ORAL_TABLET | Freq: Every day | ORAL | Status: DC

## 2015-06-20 NOTE — Evaluation (Signed)
Physical Therapy Evaluation Patient Details Name: Kim Mullins MRN: [id number] DOB: [birth year] Today's Date: 06/20/2015   History of Present Illness  Pt is an 81 yo female admitted for report of R visual field cut experienced after chair YOGA at her church. Pt feels it is resolving.  Otherwise close to baseline.  Pt with L PCA infart and was given TPA.l  Clinical Impression  Pt admitted with above diagnosis. Pt currently with functional limitations due to the deficits listed below (see PT Problem List). At the time of PT eval pt was able to perform transfers and ambulation with min guard assist to supervision for safety with SPC. Pt reports some mild balance deficits at baseline which were noted during gait training, however she denies any falls at home. Feel this pt will benefit from outpatient PT for higher level balance training at d/c. Recommended someone be with her 24 hours at home for first few days just for safety. Pt will benefit from skilled PT to increase their independence and safety with mobility to allow discharge to the venue listed below.      Follow Up Recommendations Outpatient PT;Supervision for mobility/OOB    Equipment Recommendations  None recommended by PT    Recommendations for Other Services       Precautions / Restrictions Precautions Precautions: Fall Precaution Comments: Pt was able to find large obstacles on the R when walking in hallway.  pt had difficulty finding items in R upper quadrant of her environment Restrictions Weight Bearing Restrictions: No      Mobility  Bed Mobility Overal bed mobility: Modified Independent             General bed mobility comments: slow and used bed rails.  Transfers Overall transfer level: Needs assistance Equipment used: Straight cane Transfers: Sit to/from Stand Sit to Stand: Supervision Stand pivot transfers: Supervision       General transfer comment: No physical assist to power-up to full  standing position.   Ambulation/Gait Ambulation/Gait assistance: Min guard Ambulation Distance (Feet): 200 Feet Assistive device: Straight cane Gait Pattern/deviations: Step-through pattern;Decreased stride length;Trendelenburg Gait velocity: Decreased Gait velocity interpretation: Below normal speed for age/gender General Gait Details: Occasional unsteadiness noted but no physical assist to recover.   Stairs            Wheelchair Mobility    Modified Rankin (Stroke Patients Only)       Balance Overall balance assessment: Needs assistance Sitting-balance support: Feet supported;No upper extremity supported Sitting balance-Leahy Scale: Good     Standing balance support: Single extremity supported;During functional activity Standing balance-Leahy Scale: Fair Standing balance comment: Pt walked with IV pole.  Pt would be better evaluated with cane.  Pt able to stand at sink with S.                             Pertinent Vitals/Pain Pain Assessment: No/denies pain    Home Living Family/patient expects to be discharged to:: Private residence Living Arrangements: Alone Available Help at Discharge: Friend(s);Available PRN/intermittently Type of Home: House Home Access: Stairs to enter   Entrance Stairs-Number of Steps: 1 Home Layout: One level Home Equipment: Cane - single point;Bedside commode;Shower seat Additional Comments: Pt every active, doctor of divinity (just received this), does yoga, water aerobics, book and bible club.    Prior Function Level of Independence: Independent with assistive device(s)         Comments: uses straight cane.  Hand Dominance   Dominant Hand: Right    Extremity/Trunk Assessment   Upper Extremity Assessment: Defer to OT evaluation           Lower Extremity Assessment: Overall WFL for tasks assessed (MMT strength good, balance fair and hip abd weakness noted)      Cervical / Trunk Assessment: Kyphotic   Communication   Communication: No difficulties  Cognition Arousal/Alertness: Awake/alert Behavior During Therapy: WFL for tasks assessed/performed Overall Cognitive Status: Impaired/Different from baseline Area of Impairment: Safety/judgement         Safety/Judgement: Decreased awareness of safety     General Comments: Pt appears to do okay cognitively but did not understand why it was unsafe to drive to her eye doctor after feeling like she lost her R visual field.  Not sure this is a cognitive deficit as much as just a woman that has been very independent her entire life and wanted to take care of this before leaving for a reunion in [place] later that day.    General Comments General comments (skin integrity, edema, etc.): Reports that she had an episode of seeing "diamonds" after working with OT.     Exercises        Assessment/Plan    PT Assessment Patient needs continued PT services  PT Diagnosis Difficulty walking   PT Problem List Decreased strength;Decreased range of motion;Decreased activity tolerance;Decreased balance;Decreased mobility;Decreased knowledge of use of DME;Decreased safety awareness;Decreased knowledge of precautions;Decreased coordination  PT Treatment Interventions DME instruction;Gait training;Functional mobility training;Stair training;Therapeutic activities;Therapeutic exercise;Neuromuscular re-education;Patient/family education   PT Goals (Current goals can be found in the Care Plan section) Acute Rehab PT Goals Patient Stated Goal: to go to my reunion PT Goal Formulation: With patient Time For Goal Achievement: 06/27/15 Potential to Achieve Goals: Good    Frequency Min 4X/week   Barriers to discharge Decreased caregiver support Lives alone    Co-evaluation               End of Session Equipment Utilized During Treatment: Gait belt Activity Tolerance: Patient tolerated treatment well Patient left: in chair;with call bell/phone within  reach Nurse Communication: Mobility status         Time: [id number] PT Time Calculation (min) (ACUTE ONLY): 17 min   Charges:   PT Evaluation $Initial PT Evaluation Tier I: 1 Procedure     PT G CodesRolinda Roan 07-08-15, 11:59 AM  Rolinda Roan, PT, DPT Acute Rehabilitation Services Pager: [phone]

## 2015-06-20 NOTE — Care Management Important Message (Signed)
Important Message  Patient Details  Name: Kim Mullins MRN: [id number] Date of Birth: 1930/03/24   Medicare Important Message Given:  Yes-second notification given    Nathen May 06/20/2015, 12:17 PM

## 2015-06-20 NOTE — Progress Notes (Signed)
STROKE TEAM PROGRESS NOTE   SUBJECTIVE (INTERVAL HISTORY) Patient up in chair at the bedside. Reports he vision is much improved.   OBJECTIVE Temp:  [97.5 F (36.4 C)-99.5 F (37.5 C)] 98.4 F (36.9 C) (10/21 0536) Pulse Rate:  [65-75] 72 (10/21 0536) Cardiac Rhythm:  [-] Ventricular paced (10/21 1257) Resp:  [16-18] 18 (10/21 0536) BP: (91-115)/(62-82) 115/68 mmHg (10/21 0536) SpO2:  [96 %-100 %] 99 % (10/21 0536)  CBC:   Recent Labs Lab 06/18/15 1503 06/18/15 1513  WBC 4.8  --   NEUTROABS 2.4  --   HGB 11.7* 13.9  HCT 37.3 41.0  MCV 89.7  --   PLT 232  --    Basic Metabolic Panel:   Recent Labs Lab 06/18/15 1503 06/18/15 1513  NA 140 140  K 4.6 4.5  CL 106 105  CO2 22  --   GLUCOSE 103* 103*  BUN 16 20  CREATININE 1.10* 1.10*  CALCIUM 8.9  --    Lipid Panel:     Component Value Date/Time   CHOL 110 06/19/2015 0357   TRIG 55 06/19/2015 0357   HDL 49 06/19/2015 0357   CHOLHDL 2.2 06/19/2015 0357   VLDL 11 06/19/2015 0357   LDLCALC 50 06/19/2015 0357   HgbA1c:  Lab Results  Component Value Date   HGBA1C 5.8* 06/19/2015   Urine Drug Screen: No results found for: LABOPIA, COCAINSCRNUR, LABBENZ, AMPHETMU, THCU, LABBARB    IMAGING  Ct Head Wo Contrast 06/19/2015   1. Stable atrophy and white matter disease. 2. No acute or evolving infarct. 3. Atherosclerosis.  06/18/2015   No intracranial hemorrhage or visible infarct.   Carotid Doppler  There is 1-39% bilateral ICA stenosis. Vertebral artery flow is antegrade.    PHYSICAL EXAM Pleasant obese elderly African-American lady not in distress. . Afebrile. Head is nontraumatic. Neck is supple without bruit.    Cardiac exam no murmur or gallop. Lungs are clear to auscultation. Distal pulses are well felt. Neurological Exam ;  Awake alert oriented 3 with normal speech and language function. Intact attention, registration and recall. Extraocular moments are full range without nystagmus. Fundi were not  visualized. Visual field testing shows a right upper quadrantanopsia. Face is symmetric without weakness. Tongue is midline. Motor system exam reveals no upper or lower extremity drift. No focal weakness. Sensory system exam shows symmetric and equal sensation bilaterally. Deep tendon reflexes are 1+ symmetric. Plantars are downgoing. Coordination is accurate. Gait was not tested.   ASSESSMENT/PLAN Kim Mullins is a 81 y.o. female with history of diabetes, chronic systolic CHF, complete heart block, OSA presenting with right R quadrantopsia. She received IV t-PA.   Stroke, L brain, s/p IV tPA. Stroke not seen on CT  Resultant superior R quadrantopsia  MRI  MRA  Pacer  Repeat CT at 24h without acute stroke, no hemorrhage  CTA head to look at vasculature pending   Carotid Doppler  No significant stenosis   2D Echo  11/2104 with EF 50%, no SOE  LDL 50  HgbA1c 5.8  SCDs for VTE prophylaxis Diet heart healthy/carb modified Room service appropriate?: Yes; Fluid consistency:: Thin  aspirin 81 mg daily prior to admission, now on aspirin 325 mg daily  Ongoing aggressive stroke risk factor management  Therapy recommendations:  OP PT and HH OT  Disposition:  Anticipate return home  [facility] for discharge once CTA completed if no  correctable large vessel stenosis   Follow up Dr. Leonie Man in 2 months.  Hypertension  Stable  Diabetes  HgbA1c 5.8, goal < 7.0  Other Stroke Risk Factors  Advanced age  Former Cigarette smoker, quit smoking 36 years ago   Obesity, Body mass index is 31.68 kg/(m^2).   Obstructive sleep apnea, on CPAP at home  Hospital day # [place] for Pager information 06/20/2015 2:08 PM  I have personally examined this patient, reviewed notes, independently viewed imaging studies, participated in medical decision making and plan of care. I have made any additions or clarifications directly to the above note.  Agree with note above.   Kim Contras, MD Medical Director [facility] Pager: [phone] 06/20/2015 6:38 PM   To contact Stroke Continuity provider, please refer to [email or web address] After hours, contact General Neurology While lying total 81 year old yoga class

## 2015-06-20 NOTE — Progress Notes (Signed)
SLP Cancellation Note  Patient Details Name: Kim Mullins MRN: [id number] DOB: 1930/06/20   Cancelled treatment:       Reason Eval/Treat Not Completed: Patient at procedure or test/unavailable.  SLP will follow up as able.  Gunnar Fusi, M.A., CCC-SLP [phone]  [place] 06/20/2015, 3:11 PM

## 2015-06-20 NOTE — Progress Notes (Signed)
Discharge instructions reviewed with patient. All questions answered at this time. Awaiting for transport from family.  Ekam Bonebrake, RN 

## 2015-06-20 NOTE — Progress Notes (Addendum)
Stroke Discharge Summary  Patient ID: Kim Mullins   MRN: [id number]      DOB: [birth year]  Date of Admission: 06/18/2015 Date of Discharge: 06/20/2015  Attending Physician:  Garvin Fila, MD, Stroke MD  Consulting Physician(s):     None  Patient's PCP:  Kristine Garbe, MD  DISCHARGE DIAGNOSIS:   Suspected left occipital Stroke, L brain not seen on CT head x2 , s/p IV tPA.   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   Obstructive sleep apnea, on CPAP at home   Past Medical History  Diagnosis Date  . Bowel obstruction ([place])   . Left tibial fracture 2007  . Asthma     Allergixc reaction to cats.  . Anemia     occassionally  . Arthritis     all over.  . Diabetes mellitus 2006    Diet and exercise controlled.  Marland Kitchen GERD (gastroesophageal reflux disease)     occ  . Osteoarthritis of right shoulder region 06/26/2013  . DCM (dilated cardiomyopathy) ([place]) 08/16/2014    normal coronary arteries on cath with EF 30-45%.  EF now 50% by echo 11/2014  . Chronic systolic CHF (congestive heart failure) ([facility])     EF 30-35% by cath  . Complete heart block ([facility])     a. s/p MDT CRTP pacemaker  . OSA (obstructive sleep apnea) 10/23/2014    Moderate with AHI 21/hr  . Hypertension    Past Surgical History  Procedure Laterality Date  . Abdominal hysterectomy  1969  . Dilation and curettage of uterus  1968  . Pilonidal cyst excision  1959  . Corn removal  1999    Bilateral feet  . Bunionectomy  1984    Bilateral  . Tonsillectomy  1942  . Eye surgery  1983    Surgery to fix Retainal detachment, bilateral  . Carpal tunnell  2004    Bilateral  . Total hip arthroplasty  11/16/2011    Procedure: TOTAL HIP ARTHROPLASTY ANTERIOR APPROACH;  Surgeon: Hessie Dibble, MD;  Location: [place];  Service: Orthopedics;  Laterality: Right;  DEPUY  . Total shoulder arthroplasty Right 06/26/2013     Procedure: TOTAL SHOULDER ARTHROPLASTY;  Surgeon: Johnny Bridge, MD;  Location: [place];  Service: Orthopedics;  Laterality: Right;  . Cataract extraction Right     early 2015  . Joint replacement    . Esophagogastroduodenoscopy (egd) with propofol N/A 06/11/2014    Procedure: ESOPHAGOGASTRODUODENOSCOPY (EGD) WITH PROPOFOL;  Surgeon: Inda Castle, MD;  Location: [facility];  Service: Endoscopy;  Laterality: N/A;  . Bravo ph study N/A 06/11/2014    Procedure: BRAVO [place] STUDY;  Surgeon: Inda Castle, MD;  Location: [facility];  Service: Endoscopy;  Laterality: N/A;  Venia Minks dilation  06/11/2014    Procedure: Venia Minks DILATION;  Surgeon: Inda Castle, MD;  Location: [facility];  Service: Endoscopy;;  . Left and right heart catheterization with coronary angiogram N/A 08/29/2014    Procedure: LEFT AND RIGHT HEART CATHETERIZATION WITH CORONARY ANGIOGRAM;  Surgeon: Peter M Martinique, MD;  Location: [facility] CATH LAB;  Service: Cardiovascular;  Laterality: N/A;  . Bi-ventricular pacemaker insertion N/A 10/07/2014    MDT CRTP implanted by Dr Lovena Le  . Cardiac catheterization      normal coronary arteries      Medication List    STOP taking these medications  aspirin EC 81 MG tablet      TAKE these medications        carvedilol 3.125 MG tablet  Commonly known as:  COREG  TAKE 1 TABLET BY MOUTH 2 TIMES DAILY WITH A MEAL.     clopidogrel 75 MG tablet  Commonly known as:  PLAVIX  Take 1 tablet (75 mg total) by mouth daily.     diclofenac sodium 1 % Gel  Commonly known as:  VOLTAREN  Apply 1 application topically 3 (three) times daily as needed (pain, inflammation).     furosemide 40 MG tablet  Commonly known as:  LASIX  TAKE 1 TABLET (40 MG TOTAL) BY MOUTH DAILY.        LABORATORY STUDIES CBC    Component Value Date/Time   WBC 4.8 06/18/2015 1503   RBC 4.16 06/18/2015 1503   HGB 13.9 06/18/2015 1513   HCT 41.0 06/18/2015 1513   PLT 232 06/18/2015 1503   MCV 89.7  06/18/2015 1503   MCH 28.1 06/18/2015 1503   MCHC 31.4 06/18/2015 1503   RDW 14.2 06/18/2015 1503   LYMPHSABS 1.8 06/18/2015 1503   MONOABS 0.4 06/18/2015 1503   EOSABS 0.1 06/18/2015 1503   BASOSABS 0.1 06/18/2015 1503   CMP    Component Value Date/Time   NA 140 06/18/2015 1513   K 4.5 06/18/2015 1513   CL 105 06/18/2015 1513   CO2 22 06/18/2015 1503   GLUCOSE 103* 06/18/2015 1513   BUN 20 06/18/2015 1513   CREATININE 1.10* 06/18/2015 1513   CREATININE 1.15* 10/21/2014 1349   CALCIUM 8.9 06/18/2015 1503   PROT 7.2 06/18/2015 1503   ALBUMIN 3.5 06/18/2015 1503   AST 21 06/18/2015 1503   ALT 10* 06/18/2015 1503   ALKPHOS 58 06/18/2015 1503   BILITOT 0.6 06/18/2015 1503   GFRNONAA 45* 06/18/2015 1503   GFRAA 53* 06/18/2015 1503   COAGS Lab Results  Component Value Date   INR 1.12 06/18/2015   INR 1.05 08/29/2014   INR 2.13* 11/22/2011   Lipid Panel    Component Value Date/Time   CHOL 110 06/19/2015 0357   TRIG 55 06/19/2015 0357   HDL 49 06/19/2015 0357   CHOLHDL 2.2 06/19/2015 0357   VLDL 11 06/19/2015 0357   LDLCALC 50 06/19/2015 0357   HgbA1C  Lab Results  Component Value Date   HGBA1C 5.8* 06/19/2015   Cardiac Panel (last 3 results) No results for input(s): CKTOTAL, CKMB, TROPONINI, RELINDX in the last 72 hours. Urinalysis    Component Value Date/Time   COLORURINE AMBER* 10/06/2014 1552   APPEARANCEUR CLEAR 10/06/2014 1552   LABSPEC 1.020 10/06/2014 1552   PHURINE 5.0 10/06/2014 1552   GLUCOSEU NEGATIVE 10/06/2014 1552   GLUCOSEU NEGATIVE 07/10/2014 1517   HGBUR SMALL* 10/06/2014 1552   BILIRUBINUR SMALL* 10/06/2014 1552   KETONESUR 15* 10/06/2014 1552   PROTEINUR 30* 10/06/2014 1552   UROBILINOGEN 0.2 10/06/2014 1552   NITRITE NEGATIVE 10/06/2014 1552   LEUKOCYTESUR LARGE* 10/06/2014 1552   Urine Drug Screen No results found for: LABOPIA, COCAINSCRNUR, LABBENZ, AMPHETMU, THCU, LABBARB  Alcohol Level No results found for: [facility]   SIGNIFICANT  DIAGNOSTIC STUDIES  Ct Angio Head W/cm &/or Wo Cm 06/20/2015  1. No acute arterial finding. 2. Mild intracranial atherosclerosis.No treatable flow limiting stenosis.   Ct Head Wo Contrast 06/19/2015   1. Stable atrophy and white matter disease. 2. No acute or evolving infarct. 3. Atherosclerosis.  06/18/2015  No intracranial hemorrhage or visible infarct.  Carotid Doppler There is 1-39% bilateral ICA stenosis. Vertebral artery flow is antegrade.    HISTORY OF PRESENT ILLNESS Kim Mullins is an 81 y.o. female who was at church doing yoga today and felt normal. At 1200 she left her yoga class and noted she could not see out the right aspect of her vision (LKW 1200 06/18/2015). She drove to her opthalmologist and she was sent to [place] for fear of stroke. While in ED code stroke was called due to right hemianopsia. She was within the window and tPA was administered due to visual field cut. Modified Rankin: Rankin Score=0 She was admitted to [facility] for further evaluation and treatment.    HOSPITAL COURSE [name] is a 81 y.o. female with history of diabetes, chronic systolic CHF, complete heart block, OSA presenting with right R quadrantopsia. She received IV t-PA.   Stroke, L brain, s/p IV tPA. Stroke not seen on CT  Resultant superior R quadrantopsia  MRI MRA Pacer  Repeat CT at 24h without acute stroke, no hemorrhage  CTA head to look at vasculature pending   Carotid Doppler No significant stenosis  2D Echo 11/2104 with EF 50%, no SOE  LDL 50  HgbA1c 5.8  aspirin 81 mg daily prior to admission, now on aspirin 325 mg daily. Given stroke on aspirin, change to plavix 75 mg daily at discharge.  Ongoing aggressive stroke risk factor management  Therapy recommendations: OP PT and HH OT  Disposition: Anticipate return home  Hypertension  Stable  Diabetes  HgbA1c 5.8, goal < 7.0  Other Stroke Risk Factors  Advanced age  Former Cigarette  smoker, quit smoking 36 years ago   Obesity, Body mass index is 31.68 kg/(m^2).   Obstructive sleep apnea, on CPAP at home   DISCHARGE EXAM Blood pressure 97/57, pulse 66, temperature 98.2 F (36.8 C), temperature source Oral, resp. rate 20, height 5\' 3"  (1.6 m), weight 81.1 kg (178 lb 12.7 oz), SpO2 98 %. Pleasant obese elderly African-American lady not in distress. . Afebrile. Head is nontraumatic. Neck is supple without bruit. Cardiac exam no murmur or gallop. Lungs are clear to auscultation. Distal pulses are well felt. Neurological Exam ;  Awake alert oriented 3 with normal speech and language function. Intact attention, registration and recall. Extraocular moments are full range without nystagmus. Fundi were not visualized. Visual field testing shows a right upper quadrantanopsia. Face is symmetric without weakness. Tongue is midline. Motor system exam reveals no upper or lower extremity drift. No focal weakness. Sensory system exam shows symmetric and equal sensation bilaterally. Deep tendon reflexes are 1+ symmetric. Plantars are downgoing. Coordination is accurate. Gait was not tested.   Discharge Diet   Diet heart healthy/carb modified Room service appropriate?: Yes; Fluid consistency:: Thin liquids  DISCHARGE PLAN  Disposition:  home   clopidogrel 75 mg daily for secondary stroke prevention.  Follow-up REESE,BETTI D, MD in 2 weeks.  Follow-up with Dr. Antony Contras, Stroke Clinic in 2 months.  20 minutes were spent preparing discharge.  [place] for Pager information 06/20/2015 4:08 PM  I have personally examined this patient, reviewed notes, independently viewed imaging studies, participated in medical decision making and plan of care. I have made any additions or clarifications directly to the above note. Agree with note above.   Antony Contras, MD Medical Director [facility] Stroke Center Pager: [phone] 06/20/2015 6:37  PM

## 2015-06-20 NOTE — Evaluation (Signed)
Occupational Therapy Evaluation Patient Details Name: Kim Mullins MRN: [id number] DOB: 1930-01-18 Today's Date: 06/20/2015    History of Present Illness Pt is an 81 yo female admitted for report of R visual field cut experienced after chair YOGA at her church. Pt feels it is resolving.  Otherwise close to baseline.  Pt with L PCA infart and was given TPA.l   Clinical Impression   Pt admitted for above diagnosis and has the deficits listed below. Pt would benefit from cont OT to further challenge pt with her vision to see if she will be safe going home independently.  Pt is a very active woman and drives to most of her activities.  Feel her vision will have to be further evaluated before returning to driving.  Pt feels she is safe to drive now.  Pt's vision does seem to be improving on the R but she still is not seeing clearly to this side and poses a safety risk in a bigger environment.  Ideally, it would be nice to have someone stay with her for a few days to transition back home.     Follow Up Recommendations  Home health OT;Supervision/Assistance - 24 hour (S for just first few days.  No driving until vision evaled.)    Equipment Recommendations  None recommended by OT    Recommendations for Other Services PT consult     Precautions / Restrictions Precautions Precautions: Fall Precaution Comments: Pt was able to find large obstacles on the R when walking in hallway.  pt had difficulty finding items in R upper quadrant of her environment Restrictions Weight Bearing Restrictions: No      Mobility Bed Mobility Overal bed mobility: Modified Independent             General bed mobility comments: slow and used bed rails.  Transfers Overall transfer level: Needs assistance Equipment used: 1 person hand held assist Transfers: Sit to/from [facility] Sit to Stand: Supervision Stand pivot transfers: Supervision       General transfer comment: Pt  held to IV and walked in hallway.  Her foot hit IV pole legs a few times but no LOB.  pt may have been better using a regular cane.    Balance Overall balance assessment: Needs assistance Sitting-balance support: Feet supported Sitting balance-Leahy Scale: Good     Standing balance support: During functional activity;Single extremity supported Standing balance-Leahy Scale: Fair Standing balance comment: Pt walked with IV pole.  Pt would be better evaluated with cane.  Pt able to stand at sink with S.                            ADL Overall ADL's : At baseline                                       General ADL Comments: Feel pt does well with her basic adls in this safe enviroment.  Not sure how much this visual deficit will show itself in a larger not so controlled environment(SUCH AS ON THE ROAD).       Vision Vision Assessment?: Yes Eye Alignment: Within Functional Limits Ocular Range of Motion: Restricted on the right;Restricted looking up;Other (comment) (not as smooth looking R upper quadrant.) Alignment/Gaze Preference: Within Defined Limits Tracking/Visual Pursuits: Decreased smoothness of horizontal tracking;Decreased smoothness of eye movement to RIGHT  superior field Saccades: Within functional limits Convergence: Within functional limits Visual Fields: Right superior homonymous quadranopsia;Right visual field deficit;Impaired-to be further tested in functional context Additional Comments: Difficult to assess.  Pt feels it is better than yesterday.  Sees things in R visual field but they are not as clear as what is in L field.  Pt did walk around obstacles that were in hallway and did not trip over items at floor level on R but had problems locating items on the ceiling or top of the wall on the R.    Perception Perception Perception Tested?: Yes   Praxis Praxis Praxis tested?: Within functional limits    Pertinent Vitals/Pain Pain Assessment:  No/denies pain     Hand Dominance Right   Extremity/Trunk Assessment Upper Extremity Assessment Upper Extremity Assessment: Overall WFL for tasks assessed (arthritis in B shoulders.  R has been replaced.)   Lower Extremity Assessment Lower Extremity Assessment: Defer to PT evaluation   Cervical / Trunk Assessment Cervical / Trunk Assessment: Kyphotic   Communication Communication Communication: No difficulties   Cognition Arousal/Alertness: Awake/alert Behavior During Therapy: WFL for tasks assessed/performed Overall Cognitive Status: Impaired/Different from baseline Area of Impairment: Safety/judgement         Safety/Judgement: Decreased awareness of safety     General Comments: Pt appears to do okay cognitively but did not understand why it was unsafe to drive to her eye doctor after feeling like she lost her R visual field.  Not sure this is a cognitive deficit as much as just a woman that has been very independent her entire life and wanted to take care of this before leaving for a reunion in [place] later that day.   General Comments       Exercises       Shoulder Instructions      Home Living Family/patient expects to be discharged to:: Private residence Living Arrangements: Alone Available Help at Discharge: Friend(s);Available PRN/intermittently Type of Home: House Home Access: Stairs to enter [employer] of Steps: 1   Home Layout: One level     Bathroom Shower/Tub: Tub/shower unit;Curtain;Other (comment) (bathes at Y after swimming b/c it is walk in shower) Shower/tub characteristics: Curtain Biochemist, clinical: Standard     Home Equipment: Cane - single point;Bedside commode;Shower seat   Additional Comments: Pt every active, doctor of divinity (just received this), does yoga, water aerobics, book and bible club.      Prior Functioning/Environment Level of Independence: Independent with assistive device(s)        Comments: uses straight  cane.    OT Diagnosis: Cognitive deficits;Disturbance of vision   OT Problem List: Decreased cognition;Impaired vision/perception   OT Treatment/Interventions: Self-care/ADL training;Therapeutic activities    OT Goals(Current goals can be found in the care plan section) Acute Rehab OT Goals Patient Stated Goal: to go to my reunion OT Goal Formulation: With patient Time For Goal Achievement: 06/27/15 Potential to Achieve Goals: Good ADL Goals Additional ADL Goal #1: Pt will locate all clothes in room, gather them, and get dressed using cane and mod I. Additional ADL Goal #2: Pt will follow signs in hallway to go to cafeteria wtih therapist using cane with mininal cues to further evaluate vision Additional ADL Goal #3: Pt will gather towels from linen closet in hallway and bathe in shower with seat with cues only to locate linens in hallway.  OT Frequency: Min 2X/week   Barriers to D/C: Decreased caregiver support  Pt lives alone.  Friends check in.  Ideally it would be good for someone to stay with her for a couple of days to make sure she can function safely in her own home.       Co-evaluation              End of Session Nurse Communication: Mobility status  Activity Tolerance: Patient tolerated treatment well Patient left: in chair;with call bell/phone within reach   Time: 1010-1037 OT Time Calculation (min): 27 min Charges:  OT General Charges $OT Visit: 1 Procedure OT Evaluation $Initial OT Evaluation Tier I: 1 Procedure OT Treatments $Self Care/Home Management : 8-22 mins G-Codes:    Glenford Peers 2015-06-26, 10:58 AM  [phone]

## 2015-06-20 NOTE — Evaluation (Signed)
Speech Language Pathology Evaluation Patient Details Name: Kim Mullins MRN: [id number] DOB: May 27, 1930 Today's Date: 06/20/2015 Time: [id number] SLP Time Calculation (min) (ACUTE ONLY): 18 min  Problem List:  Patient Active Problem List   Diagnosis Date Noted  . Occipital infarction ([place])   . CVA (cerebral infarction) 06/18/2015  . Homonymous hemianopsia   . Pacemaker 01/21/2015  . OSA (obstructive sleep apnea) 10/23/2014  . Dizziness 10/21/2014  . Complete heart block ([place]) 10/06/2014  . Benign essential HTN 10/04/2014  . Chronic systolic CHF (congestive heart failure) ([place]) 10/04/2014  . Abnormal cardiovascular stress test 08/27/2014  . Pulmonary HTN ([place]) 08/16/2014  . DCM (dilated cardiomyopathy) ([place]) 08/16/2014  . Cough 07/13/2014  . Esophageal stricture 06/11/2014  . Nonspecific (abnormal) findings on radiological and other examination of gastrointestinal tract 05/29/2014  . Dyspnea 04/29/2014  . Osteoarthritis of right shoulder region 06/26/2013  . DJD (degenerative joint disease) of hip 11/16/2011    Class: Present on Admission   Past Medical History:  Past Medical History  Diagnosis Date  . Bowel obstruction ([place])   . Left tibial fracture 2007  . Asthma     Allergixc reaction to cats.  . Anemia     occassionally  . Arthritis     all over.  . Diabetes mellitus 2006    Diet and exercise controlled.  Marland Kitchen GERD (gastroesophageal reflux disease)     occ  . Osteoarthritis of right shoulder region 06/26/2013  . DCM (dilated cardiomyopathy) ([place]) 08/16/2014    normal coronary arteries on cath with EF 30-45%.  EF now 50% by echo 11/2014  . Chronic systolic CHF (congestive heart failure) ([facility])     EF 30-35% by cath  . Complete heart block ([facility])     a. s/p MDT CRTP pacemaker  . OSA (obstructive sleep apnea) 10/23/2014    Moderate with AHI 21/hr  . Hypertension    Past Surgical History:  Past Surgical History  Procedure Laterality Date  . Abdominal  hysterectomy  1969  . Dilation and curettage of uterus  1968  . Pilonidal cyst excision  1959  . Corn removal  1999    Bilateral feet  . Bunionectomy  1984    Bilateral  . Tonsillectomy  1942  . Eye surgery  1983    Surgery to fix Retainal detachment, bilateral  . Carpal tunnell  2004    Bilateral  . Total hip arthroplasty  11/16/2011    Procedure: TOTAL HIP ARTHROPLASTY ANTERIOR APPROACH;  Surgeon: Hessie Dibble, MD;  Location: [facility];  Service: Orthopedics;  Laterality: Right;  DEPUY  . Total shoulder arthroplasty Right 06/26/2013    Procedure: TOTAL SHOULDER ARTHROPLASTY;  Surgeon: Johnny Bridge, MD;  Location: [facility];  Service: Orthopedics;  Laterality: Right;  . Cataract extraction Right     early 2015  . Joint replacement    . Esophagogastroduodenoscopy (egd) with propofol N/A 06/11/2014    Procedure: ESOPHAGOGASTRODUODENOSCOPY (EGD) WITH PROPOFOL;  Surgeon: Inda Castle, MD;  [facility]: [facility];  Service: Endoscopy;  Laterality: N/A;  . Bravo ph study N/A 06/11/2014    Procedure: BRAVO [place] STUDY;  Surgeon: Inda Castle, MD;  [facility];  Service: Endoscopy;  Laterality: N/A;  Venia Minks dilation  06/11/2014    Procedure: Venia Minks DILATION;  Surgeon: Inda Castle, MD;  [facility];  Service: Endoscopy;;  . Left and right heart catheterization with coronary angiogram N/A 08/29/2014    Procedure: LEFT AND RIGHT HEART CATHETERIZATION WITH  CORONARY ANGIOGRAM;  Surgeon: Peter M Martinique, MD;  Location: [facility] C[facility];  Service: Cardiovascular;  Laterality: N/A;  . Bi-ventricular pacemaker insertion N/A 10/07/2014    MDT CRTP implanted by Dr Lovena Le  . Cardiac catheterization      normal coronary arteries   HPI:  Kim Mullins is an 81 y.o. female who was at church doing yoga today and felt normal. At 1200 she left her yoga class and noted she could not see out the right aspect of her vision. She drove to her opthalmologist and she was sent [facility] for fear of stroke. While in ED code stroke was called due to right hemianopsia. She was within the window and tPA was administered due to visual field cut. CT negative for acute changes.   Assessment / Plan / Recommendation Clinical Impression  Cognitive-linguistic evaluation complete.  Patient completed the [facility] Cognitive Assessment with a WFL score 27/30.  Patient required some semantic cues for accurate delayed recall of information; however, patient reports that use of memory compensatory strategies prior to admission.  Speech, receptive and expressive language intact.  As a result, no skilled SLP services are warranted at this time  Education complete and patient in agreement.  SLP signing off.      SLP Assessment  Patient does not need any further Speech Lanaguage Pathology Services    Follow Up Recommendations  None            Pertinent Vitals/Pain Pain Assessment: No/denies pain   SLP Goals  Progression toward goals:  (Eval) Patient/Family Stated Goal: to go home   SLP Evaluation Prior Functioning  Cognitive/Linguistic Baseline: Within functional limits Type of Home: House  Lives With: Alone Available Help at Discharge: Friend(s);Available PRN/intermittently Education: PhD   Cognition  Overall Cognitive Status: Within Functional Limits for tasks assessed Orientation Level: Oriented X4    Comprehension  Auditory Comprehension Overall Auditory Comprehension: Appears within functional limits for tasks assessed Visual Recognition/Discrimination Discrimination: Within Function Limits Reading Comprehension Reading Status: Within funtional limits    Expression Expression Primary Mode of Expression: Verbal Verbal Expression Overall Verbal Expression: Appears within functional limits for tasks assessed   Oral / Motor Oral Motor/Sensory Function Overall Oral Motor/Sensory Function: Appears within functional limits for tasks assessed Motor Speech Overall Motor  Speech: Appears within functional limits for tasks assessed   GO    Carmelia Roller., CCC-SLP [id number]  Kim Mullins 06/20/2015, 4:43 PM

## 2015-06-23 ENCOUNTER — Other Ambulatory Visit: Payer: Self-pay

## 2015-06-23 NOTE — Patient Outreach (Signed)
Outreach to confirm enrollment in [facility] Stroke Transition series; patient refused but did agree to [facility] in 75 days.

## 2015-06-24 NOTE — Discharge Summary (Addendum)
Kim Fila, MD Physician Signed Neurology Progress Notes 06/20/2015 4:00 PM     Stroke Discharge Summary  Patient Kim Mullins MRN: [id number] DOB: [birth year]  Date of Admission: 06/18/2015 Date of Discharge: 06/20/2015  Attending Physician: Kim Fila, MD, Stroke MD  Consulting Physician(s):   None  Patient's PCP: Kristine Garbe, MD  DISCHARGE DIAGNOSIS:   Suspected left occipital Stroke, L brain not seen on CT head x2 , s/p IV tPA.   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   Obstructive sleep apnea, on CPAP at home   Past Medical History  Diagnosis Date  . Bowel obstruction ([place])   . Left tibial fracture 2007  . Asthma     Allergixc reaction to cats.  . Anemia     occassionally  . Arthritis     all over.  . Diabetes mellitus 2006    Diet and exercise controlled.  Marland Kitchen GERD (gastroesophageal reflux disease)     occ  . Osteoarthritis of right shoulder region 06/26/2013  . DCM (dilated cardiomyopathy) ([employer]) 08/16/2014    normal coronary arteries on cath with EF 30-45%. EF now 50% by echo 11/2014  . Chronic systolic CHF (congestive heart failure) ([facility])     EF 30-35% by cath  . Complete heart block ([facility])     a. s/p MDT CRTP pacemaker  . OSA (obstructive sleep apnea) 10/23/2014    Moderate with AHI 21/hr  . Hypertension    Past Surgical History  Procedure Laterality Date  . Abdominal hysterectomy  1969  . Dilation and curettage of uterus  1968  . Pilonidal cyst excision  1959  . Corn removal  1999    Bilateral feet  . Bunionectomy  1984    Bilateral  . Tonsillectomy  1942  . Eye surgery  1983    Surgery to fix Retainal  detachment, bilateral  . Carpal tunnell  2004    Bilateral  . Total hip arthroplasty  11/16/2011    Procedure: TOTAL HIP ARTHROPLASTY ANTERIOR APPROACH; Surgeon: Hessie Dibble, MD; Location: [facility]; Service: Orthopedics; Laterality: Right; DEPUY  . Total shoulder arthroplasty Right 06/26/2013    Procedure: TOTAL SHOULDER ARTHROPLASTY; Surgeon: Johnny Bridge, MD; Location: [facility]; Service: Orthopedics; Laterality: Right;  . Cataract extraction Right     early 2015  . Joint replacement    . Esophagogastroduodenoscopy (egd) with propofol N/A 06/11/2014    Procedure: ESOPHAGOGASTRODUODENOSCOPY (EGD) WITH PROPOFOL; Surgeon: Inda Castle, MD; [facility]; Service: Endoscopy; Laterality: N/A;  . Bravo ph study N/A 06/11/2014    Procedure: BRAVO [place] STUDY; Surgeon: Inda Castle, MD; Location: [facility]; Service: Endoscopy; Laterality: N/A;  Venia Minks dilation  06/11/2014    Procedure: Venia Minks DILATION; Surgeon: Inda Castle, MD; Location: [facility]; Service: Endoscopy;;  . Left and right heart catheterization with coronary angiogram N/A 08/29/2014    Procedure: LEFT AND RIGHT HEART CATHETERIZATION WITH CORONARY ANGIOGRAM; Surgeon: Peter M Martinique, MD; Location: [facility] CATH LAB; Service: Cardiovascular; Laterality: N/A;  . Bi-ventricular pacemaker insertion N/A 10/07/2014    MDT CRTP implanted by Dr Lovena Le  . Cardiac catheterization      normal coronary arteries      Medication List    STOP taking these medications       aspirin EC 81 MG tablet      TAKE these medications       carvedilol 3.125 MG tablet  Commonly known as: COREG  TAKE 1 TABLET BY MOUTH 2 TIMES DAILY WITH A MEAL.     clopidogrel 75 MG tablet  Commonly known as: PLAVIX  Take 1 tablet (75 mg total) by mouth daily.     diclofenac sodium 1 % Gel  Commonly known as:  VOLTAREN  Apply 1 application topically 3 (three) times daily as needed (pain, inflammation).     furosemide 40 MG tablet  Commonly known as: LASIX  TAKE 1 TABLET (40 MG TOTAL) BY MOUTH DAILY.        LABORATORY STUDIES CBC  Labs (Brief)       Component Value Date/Time   WBC 4.8 06/18/2015 1503   RBC 4.16 06/18/2015 1503   HGB 13.9 06/18/2015 1513   HCT 41.0 06/18/2015 1513   PLT 232 06/18/2015 1503   MCV 89.7 06/18/2015 1503   MCH 28.1 06/18/2015 1503   MCHC 31.4 06/18/2015 1503   RDW 14.2 06/18/2015 1503   LYMPHSABS 1.8 06/18/2015 1503   MONOABS 0.4 06/18/2015 1503   EOSABS 0.1 06/18/2015 1503   BASOSABS 0.1 06/18/2015 1503     CMP  Labs (Brief)       Component Value Date/Time   NA 140 06/18/2015 1513   K 4.5 06/18/2015 1513   CL 105 06/18/2015 1513   CO2 22 06/18/2015 1503   GLUCOSE 103* 06/18/2015 1513   BUN 20 06/18/2015 1513   CREATININE 1.10* 06/18/2015 1513   CREATININE 1.15* 10/21/2014 1349   CALCIUM 8.9 06/18/2015 1503   PROT 7.2 06/18/2015 1503   ALBUMIN 3.5 06/18/2015 1503   AST 21 06/18/2015 1503   ALT 10* 06/18/2015 1503   ALKPHOS 58 06/18/2015 1503   BILITOT 0.6 06/18/2015 1503   GFRNONAA 45* 06/18/2015 1503   GFRAA 53* 06/18/2015 1503     COAGS  Recent Labs    Lab Results  Component Value Date   INR 1.12 06/18/2015   INR 1.05 08/29/2014   INR 2.13* 11/22/2011     Lipid Panel  Labs (Brief)       Component Value Date/Time   CHOL 110 06/19/2015 0357   TRIG 55 06/19/2015 0357   HDL 49 06/19/2015 0357   CHOLHDL 2.2 06/19/2015 0357   VLDL 11 06/19/2015 0357   LDLCALC 50 06/19/2015 0357     HgbA1C   Recent Labs    Lab Results  Component Value Date   HGBA1C 5.8* 06/19/2015     Cardiac Panel (last 3 results)    Recent Labs (last 2 labs)      No results for input(s): CKTOTAL, CKMB, TROPONINI, RELINDX in the last 72 hours.   Urinalysis  Labs (Brief)       Component Value Date/Time   COLORURINE AMBER* 10/06/2014 1552   APPEARANCEUR CLEAR 10/06/2014 1552   LABSPEC 1.020 10/06/2014 1552   PHURINE 5.0 10/06/2014 1552   GLUCOSEU NEGATIVE 10/06/2014 1552   GLUCOSEU NEGATIVE 07/10/2014 1517   HGBUR SMALL* 10/06/2014 1552   BILIRUBINUR SMALL* 10/06/2014 1552   KETONESUR 15* 10/06/2014 1552   PROTEINUR 30* 10/06/2014 1552   UROBILINOGEN 0.2 10/06/2014 1552   NITRITE NEGATIVE 10/06/2014 1552   LEUKOCYTESUR LARGE* 10/06/2014 1552     Urine Drug Screen    Labs (Brief)    No results found for: LABOPIA, COCAINSCRNUR, LABBENZ, AMPHETMU, THCU, LABBARB    Alcohol Level  Labs (Brief)    No results found for: ETH     SIGNIFICANT DIAGNOSTIC STUDIES  Ct Angio Head W/cm &/or  Wo Cm 06/20/2015 1. No acute arterial finding. 2. Mild intracranial atherosclerosis.No treatable flow limiting stenosis.   Ct Head Wo Contrast 06/19/2015 1. Stable atrophy and white matter disease. 2. No acute or evolving infarct. 3. Atherosclerosis.  06/18/2015 No intracranial hemorrhage or visible infarct.   Carotid Doppler There is 1-39% bilateral ICA stenosis. Vertebral artery flow is antegrade.    HISTORY OF PRESENT ILLNESS Kim Mullins is an 81 y.o. female who was at church doing yoga today and felt normal. At 1200 she left her yoga class and noted she could not see out the right aspect of her vision (LKW 1200 06/18/2015). She drove to her opthalmologist and she was sent to [place] for fear of stroke. While in ED code stroke was called due to right hemianopsia. She was within the window and tPA was administered due to visual field cut. Modified Rankin: Rankin Score=0 She was admitted to [facility] for further evaluation and treatment.   HOSPITAL COURSE Kim Mullins is a 81  y.o. female with history of diabetes, chronic systolic CHF, complete heart block, OSA presenting with right R quadrantopsia. She received IV t-PA.   Stroke, L brain, s/p IV tPA. Stroke not seen on CT  Resultant superior R quadrantopsia  MRI MRA Pacer  Repeat CT at 24h without acute stroke, no hemorrhage  CTA head to look at vasculature pending   Carotid Doppler No significant stenosis  2D Echo 11/2104 with EF 50%, no SOE  LDL 50  HgbA1c 5.8  aspirin 81 mg daily prior to admission, now on aspirin 325 mg daily. Given stroke on aspirin, change to plavix 75 mg daily at discharge.  Ongoing aggressive stroke risk factor management  Therapy recommendations: OP PT and HH OT  Disposition: Anticipate return home  Hypertension  Stable  Diabetes  HgbA1c 5.8, goal < 7.0  Other Stroke Risk Factors  Advanced age  Former Cigarette smoker, quit smoking 36 years ago   Obesity, Body mass index is 31.68 kg/(m^2).   Obstructive sleep apnea, on CPAP at home   DISCHARGE EXAM Blood pressure 97/57, pulse 66, temperature 98.2 F (36.8 C), temperature source Oral, resp. rate 20, height 5\' 3"  (1.6 m), weight 81.1 kg (178 lb 12.7 oz), SpO2 98 %. Pleasant obese elderly African-American lady not in distress. . Afebrile. Head is nontraumatic. Neck is supple without bruit. Cardiac exam no murmur or gallop. Lungs are clear to auscultation. Distal pulses are well felt. Neurological Exam ;  Awake alert oriented 3 with normal speech and language function. Intact attention, registration and recall. Extraocular moments are full range without nystagmus. Fundi were not visualized. Visual field testing shows a right upper quadrantanopsia. Face is symmetric without weakness. Tongue is midline. Motor system exam reveals no upper or lower extremity drift. No focal weakness. Sensory system exam shows symmetric and equal sensation bilaterally. Deep tendon reflexes are 1+ symmetric. Plantars are  downgoing. Coordination is accurate. Gait was not tested.   Discharge Diet Diet heart healthy/carb modified Room service appropriate?: Yes; Fluid consistency:: Thin liquids  DISCHARGE PLAN  Disposition: home   clopidogrel 75 mg daily for secondary stroke prevention.  Follow-up REESE,BETTI D, MD in 2 weeks.  Follow-up with Dr. Antony Contras, Stroke Clinic in 2 months.  30 minutes were spent preparing discharge.  Belmore Gaines for Pager information 06/20/2015 4:08 PM  I have personally examined this patient, reviewed notes, independently viewed imaging studies, participated in medical decision making and plan  of care. I have made any additions or clarifications directly to the above note. Agree with note above.  Antony Contras, MD Medical Director [facility] Stroke Center Pager: [phone] 06/20/2015 6:37 PM I have personally examined this patient, reviewed notes, independently viewed imaging studies, participated in medical decision making and plan of care. I have made any additions or clarifications directly to the above note. Agree with note above.    Antony Contras, MD Medical Director [facility] Stroke Center Pager: [phone] 06/25/2015 8:36 AM       Revision History     Date/Time User Provider Type Action   06/20/2015 6:37 PM Kim Fila, MD Physician Sign   06/20/2015 4:09 PM Donzetta Starch, NP Nurse Practitioner Sign   View Details Report

## 2015-06-25 DIAGNOSIS — G459 Transient cerebral ischemic attack, unspecified: Secondary | ICD-10-CM

## 2015-06-26 NOTE — Congregational Nurse Program (Signed)
Congregational Nurse Program Note  Date of Encounter: 06/25/2015  Past Medical History: Past Medical History  Diagnosis Date  . Bowel obstruction ([place])   . Left tibial fracture 2007  . Asthma     Allergixc reaction to cats.  . Anemia     occassionally  . Arthritis     all over.  . Diabetes mellitus 2006    Diet and exercise controlled.  Marland Kitchen GERD (gastroesophageal reflux disease)     occ  . Osteoarthritis of right shoulder region 06/26/2013  . DCM (dilated cardiomyopathy) ([place]) 08/16/2014    normal coronary arteries on cath with EF 30-45%.  EF now 50% by echo 11/2014  . Chronic systolic CHF (congestive heart failure) (HCC)     EF 30-35% by cath  . Complete heart block (H[facility])     a. s/p MDT CRTP pacemaker  . OSA (obstructive sleep apnea) 10/23/2014    Moderate with AHI 21/hr  . Hypertension     Encounter Details:     CNP Questionnaire - 06/26/15 1136    Patient Demographics   Is this a new or existing patient? Existing   Patient is considered a/an Not Applicable   Patient Assistance   Patient's financial/insurance status Other (comment)  Medicare   Patient referred to apply for the following financial assistance Not Applicable   Food insecurities addressed Not Applicable   Transportation assistance No   Assistance securing medications No   Educational health offerings Exercise/physical activity;Safety   Encounter Details   Primary purpose of visit Post Discharge Visit   Was an Emergency Department visit averted? No   Does patient have a medical provider? Yes   Patient referred to Not Applicable   Was a mental health screening completed? (GAINS tool) No   Does patient have dental issues? No   Since previous encounter, have you referred patient for abnormal blood pressure that resulted in a new diagnosis or medication change? No   Since previous encounter, have you referred patient for abnormal blood glucose that resulted in a new diagnosis or medication change? No      Client attended the weekly yoga class and at the end of the class she advised me that she had been diagnosed with a TIA at [facility] last Wednesday.  She explained she had visual disturbance and went to her eye doctor.  The doctor sent her to the ER because the eye problem seemed to be a circulation problem.  She was admitted for 2 days and given an "IV" for 45 minutes.  She was advised she could go back to normal activities.

## 2015-07-23 ENCOUNTER — Ambulatory Visit (INDEPENDENT_AMBULATORY_CARE_PROVIDER_SITE_OTHER): Payer: Medicare Other | Admitting: Cardiology

## 2015-07-23 ENCOUNTER — Encounter: Payer: Self-pay | Admitting: Cardiology

## 2015-07-23 VITALS — BP 122/80 | HR 71 | Ht 63.0 in | Wt 175.0 lb

## 2015-07-23 DIAGNOSIS — I5022 Chronic systolic (congestive) heart failure: Secondary | ICD-10-CM

## 2015-07-23 DIAGNOSIS — I442 Atrioventricular block, complete: Secondary | ICD-10-CM

## 2015-07-23 DIAGNOSIS — G4733 Obstructive sleep apnea (adult) (pediatric): Secondary | ICD-10-CM

## 2015-07-23 DIAGNOSIS — I420 Dilated cardiomyopathy: Secondary | ICD-10-CM

## 2015-07-23 DIAGNOSIS — I272 Other secondary pulmonary hypertension: Secondary | ICD-10-CM

## 2015-07-23 DIAGNOSIS — Z950 Presence of cardiac pacemaker: Secondary | ICD-10-CM

## 2015-07-23 DIAGNOSIS — I10 Essential (primary) hypertension: Secondary | ICD-10-CM | POA: Diagnosis not present

## 2015-07-23 NOTE — Patient Instructions (Signed)

## 2015-07-23 NOTE — Progress Notes (Signed)
Cardiology Office Note   Date:  07/23/2015   ID:  Kim Mullins, DOB [birth year], MRN [id number]  PCP:  Kristine Garbe, MD    Chief Complaint  Patient presents with  . Cardiomyopathy  . Hypertension  . Sleep Apnea      History of Present Illness: Kim Mullins is a 81 y.o. female with a history of asthma, anemia, GERD with esophageal stricture s/p dilatation, DM diet controlled, normal coronary arteries with severe LV dysfunction EF 30-35% with 2+ MR and mild to moderate pulmonary HTN by cath and OSA on CPAP. She had a sleep study done showing moderate OSA with an AHI of 21/hr and underwent BIPAP titration to 10/6cm H2O. Repeat 2D echo 11/2014 on medical therapy showed improvement in EF to 50%. She now presents today for followup.  She has not had any LE edema. Her SOB has improved unless she does strenuous exercise. She denies any LE edema, chest pain, dizziness or syncope.She is doing well with her BiPAP device. She tolerates her full face mask and feels the pressure is adequate but thinks that her mask is leaking some. She still gets up in the middle of the night to urinate and also has pain in her shoulders. She has to take pain meds at night and may feel groggy from it in the am.     Past Medical History  Diagnosis Date  . Bowel obstruction ([place])   . Left tibial fracture 2007  . Asthma     Allergixc reaction to cats.  . Anemia     occassionally  . Arthritis     all over.  . Diabetes mellitus 2006    Diet and exercise controlled.  Marland Kitchen GERD (gastroesophageal reflux disease)     occ  . Osteoarthritis of right shoulder region 06/26/2013  . DCM (dilated cardiomyopathy) ([place]) 08/16/2014    normal coronary arteries on cath with EF 30-45%.  EF now 50% by echo 11/2014  . Chronic systolic CHF (congestive heart failure) (HCC)     EF 30-35% by cath  . Complete heart block (HCC)     a. s/p MDT CRTP pacemaker  . OSA (obstructive sleep apnea)  10/23/2014    Moderate with AHI 21/hr  . Hypertension     Past Surgical History  Procedure Laterality Date  . Abdominal hysterectomy  1969  . Dilation and curettage of uterus  1968  . Pilonidal cyst excision  1959  . Corn removal  1999    Bilateral feet  . Bunionectomy  1984    Bilateral  . Tonsillectomy  1942  . Eye surgery  1983    Surgery to fix Retainal detachment, bilateral  . Carpal tunnell  2004    Bilateral  . Total hip arthroplasty  11/16/2011    Procedure: TOTAL HIP ARTHROPLASTY ANTERIOR APPROACH;  Surgeon: Hessie Dibble, MD;  Location: [place];  Service: Orthopedics;  Laterality: Right;  DEPUY  . Total shoulder arthroplasty Right 06/26/2013    Procedure: TOTAL SHOULDER ARTHROPLASTY;  Surgeon: Johnny Bridge, MD;  Location: [facility];  Service: Orthopedics;  Laterality: Right;  . Cataract extraction Right     early 2015  . Joint replacement    . Esophagogastroduodenoscopy (egd) with propofol N/A 06/11/2014    Procedure: ESOPHAGOGASTRODUODENOSCOPY (EGD) WITH PROPOFOL;  Surgeon: Inda Castle, MD;  Location: [facility];  Service: Endoscopy;  Laterality: N/A;  Franki Monte  ph study N/A 06/11/2014    Procedure: BRAVO [place] STUDY;  Surgeon: Inda Castle, MD;  Location: [facility];  Service: Endoscopy;  Laterality: N/A;  Venia Minks dilation  06/11/2014    Procedure: Venia Minks DILATION;  Surgeon: Inda Castle, MD;  Location: [facility];  Service: Endoscopy;;  . Left and right heart catheterization with coronary angiogram N/A 08/29/2014    Procedure: LEFT AND RIGHT HEART CATHETERIZATION WITH CORONARY ANGIOGRAM;  Surgeon: Peter M Martinique, MD;  [facility]: [facility];  Service: Cardiovascular;  Laterality: N/A;  . Bi-ventricular pacemaker insertion N/A 10/07/2014    MDT CRTP implanted by Dr Lovena Le  . Cardiac catheterization      normal coronary arteries     Current Outpatient Prescriptions  Medication Sig Dispense Refill  . Acetaminophen (TYLENOL) 325 MG CAPS Take 1 tablet by  mouth daily as needed.    . Ascorbic Acid (VITAMIN C PO) Take 1 tablet by mouth daily.    . Bisacodyl (LAXATIVE PO) Take by mouth as directed.    Marland Kitchen CALCIUM PO Take 1 tablet by mouth daily.    . carvedilol (COREG) 3.125 MG tablet TAKE 1 TABLET BY MOUTH 2 TIMES DAILY WITH A MEAL. 60 tablet 9  . clopidogrel (PLAVIX) 75 MG tablet Take 1 tablet (75 mg total) by mouth daily. 30 tablet 2  . diclofenac sodium (VOLTAREN) 1 % GEL Apply 1 application topically 3 (three) times daily as needed (pain, inflammation).   0  . diphenhydramine-acetaminophen (TYLENOL PM) 25-500 MG TABS tablet Take 1 tablet by mouth at bedtime as needed.    . dorzolamide-timolol (COSOPT) 22.3-6.8 MG/ML ophthalmic solution Place 1 drop into both eyes 2 (two) times daily.    . furosemide (LASIX) 40 MG tablet TAKE 1 TABLET (40 MG TOTAL) BY MOUTH DAILY. 30 tablet 5  . ketorolac (ACULAR) 0.4 % SOLN Place 1 drop into both eyes. 4 times daily for 2 weeks  0  . ofloxacin (OCUFLOX) 0.3 % ophthalmic solution Place 1 drop into both eyes. 4 times daily for 1 week  0  . PREDNISOLONE ACETATE OP Place 1 drop into both eyes. 4 times daily for 1 month then 2 times daily for 1 week     No current facility-administered medications for this visit.    Allergies:   Hydrocodone; Percocet; Mercurial derivatives; Eggs or egg-derived products; Penicillins; and Quinine derivatives    Social History:  The patient  reports that she quit smoking about 37 years ago. Her smoking use included Cigarettes. She has a 45 pack-year smoking history. She has never used smokeless tobacco. She reports that she drinks about 4.2 oz of alcohol per week. She reports that she does not use illicit drugs.   Family History:  The patient's family history includes Anuerysm in her daughter; Cancer in her father; Coronary artery disease in her mother; Dementia in her mother; Diabetes in her maternal grandfather. There is no history of Colon cancer.    ROS:  Please see the history  of present illness.   Otherwise, review of systems are positive for none.   All other systems are reviewed and negative.    PHYSICAL EXAM: VS:  BP 122/80 mmHg  Pulse 71  Ht 5\' 3"  (1.6 m)  Wt 79.379 kg (175 lb)  BMI 31.01 kg/m2  SpO2 99% , BMI Body mass index is 31.01 kg/(m^2). GEN: Well nourished, well developed, in no acute distress HEENT: normal Neck: no JVD, carotid bruits, or masses Cardiac: RRR; no murmurs, rubs,  or gallops,no edema  Respiratory:  clear to auscultation bilaterally, normal work of breathing GI: soft, nontender, nondistended, + BS MS: no deformity or atrophy Skin: warm and dry, no rash Neuro:  Strength and sensation are intact Psych: euthymic mood, full affect   EKG:  EKG is not ordered today.    Recent Labs: 10/06/2014: Magnesium 2.2 01/24/2015: Pro B Natriuretic peptide (BNP) 263.0* 06/18/2015: ALT 10*; BUN 20; Creatinine, Ser 1.10*; Hemoglobin 13.9; Platelets 232; Potassium 4.5; Sodium 140    Lipid Panel    Component Value Date/Time   CHOL 110 06/19/2015 0357   TRIG 55 06/19/2015 0357   HDL 49 06/19/2015 0357   CHOLHDL 2.2 06/19/2015 0357   VLDL 11 06/19/2015 0357   LDLCALC 50 06/19/2015 0357      Wt Readings from Last 3 Encounters:  07/23/15 79.379 kg (175 lb)  06/18/15 81.1 kg (178 lb 12.7 oz)  02/12/15 78.472 kg (173 lb)      ASSESSMENT AND PLAN:  1. HTN - well controlled.Continue Coreg 3. Nonischemic DCM with severe LV dysfunction EF 30-35% by cath and now improved to 50% by echo 11/2014 Continue BB. 4. Mild to moderate pulmonary HTN - most likely multifactorial from systolic CHF/MR/asthma/OSA. 5. Normal coronary arteries by cath 6. Chronic systolic CHF - appears euvolemic. Continue Coreg and Lasix  7. CHB s/p PPM - followed by Dr. Lovena Le  8. OSA on CPAP therapy and doing well with her device. Her d/l today showed an AHI of 17.4hr on auto BiPAP and 70% compliance in using more than 4 hours nightly. I have encouraged  her to try to avoid sleeping on her back. I will get [facility] to check her mask for leak. I will increase her BiPAP to 14/10cm H2O and recheck d/l in 2 week  9. CHronic SOB multifactorial - I will check a BMET and BNP today 10.  CVA on Plavix   Current medicines are reviewed at length with the patient today.  The patient does not have concerns regarding medicines.  The following changes have been made:  no change  Labs/ tests ordered today: See above Assessment and Plan No orders of the defined types were placed in this encounter.     Disposition:   FU with me in 6 months  Signed, Sueanne Margarita, MD  07/23/2015 9:10 AM    [facility] [address], [place], [place]  [address] Phone: [phone]; Fax: [phone]

## 2015-07-28 ENCOUNTER — Encounter: Payer: Medicare Other | Admitting: *Deleted

## 2015-07-28 ENCOUNTER — Telehealth: Payer: Self-pay | Admitting: Cardiology

## 2015-07-28 NOTE — Telephone Encounter (Signed)
LMOVM reminding pt to send remote transmission.   

## 2015-07-29 ENCOUNTER — Encounter: Payer: Self-pay | Admitting: Cardiology

## 2015-07-30 ENCOUNTER — Ambulatory Visit: Payer: Medicare Other | Admitting: Neurology

## 2015-08-01 ENCOUNTER — Encounter: Payer: Self-pay | Admitting: Internal Medicine

## 2015-08-04 ENCOUNTER — Ambulatory Visit (INDEPENDENT_AMBULATORY_CARE_PROVIDER_SITE_OTHER): Payer: Medicare Other | Admitting: *Deleted

## 2015-08-04 DIAGNOSIS — I442 Atrioventricular block, complete: Secondary | ICD-10-CM | POA: Diagnosis not present

## 2015-08-04 DIAGNOSIS — I5022 Chronic systolic (congestive) heart failure: Secondary | ICD-10-CM | POA: Diagnosis not present

## 2015-08-04 NOTE — Telephone Encounter (Signed)
Spoke w/ pt and informed her that with all of her transmission she will need to send all remote transmissions manually. instructed pt how to do this. She verbalized understanding and transmission was received.

## 2015-08-05 NOTE — Progress Notes (Signed)
Remote pacemaker transmission.   

## 2015-08-06 ENCOUNTER — Encounter: Payer: Self-pay | Admitting: Cardiology

## 2015-08-07 ENCOUNTER — Encounter: Payer: Self-pay | Admitting: Neurology

## 2015-08-07 ENCOUNTER — Ambulatory Visit (INDEPENDENT_AMBULATORY_CARE_PROVIDER_SITE_OTHER): Payer: Medicare Other | Admitting: Neurology

## 2015-08-07 VITALS — BP 96/65 | HR 67 | Ht 63.0 in | Wt 177.8 lb

## 2015-08-07 DIAGNOSIS — R2990 Unspecified symptoms and signs involving the nervous system: Secondary | ICD-10-CM | POA: Diagnosis not present

## 2015-08-07 NOTE — Patient Instructions (Signed)
I had a long d/w patient about her recent stroke, risk for recurrent stroke/TIAs, personally independently reviewed imaging studies and stroke evaluation results and answered questions.Continue Plavix  for secondary stroke prevention and maintain strict control of hypertension with blood pressure goal below 130/90, diabetes with hemoglobin A1c goal below 6.5% and lipids with LDL cholesterol goal below 70 mg/dL. I also advised the patient to eat a healthy diet with plenty of whole grains, cereals, fruits and vegetables, exercise regularly and maintain ideal body weight .we also talked about fall and safety precautions. Followup in the future with me in 6 months or call earlier if necessary. Stroke Prevention Some medical conditions and behaviors are associated with an increased chance of having a stroke. You may prevent a stroke by making healthy choices and managing medical conditions. HOW CAN I REDUCE MY RISK OF HAVING A STROKE?   Stay physically active. Get at least 30 minutes of activity on most or all days.  Do not smoke. It may also be helpful to avoid exposure to secondhand smoke.  Limit alcohol use. Moderate alcohol use is considered to be:  No more than 2 drinks per day for men.  No more than 1 drink per day for nonpregnant women.  Eat healthy foods. This involves:  Eating 5 or more servings of fruits and vegetables a day.  Making dietary changes that address high blood pressure (hypertension), high cholesterol, diabetes, or obesity.  Manage your cholesterol levels.  Making food choices that are high in fiber and low in saturated fat, trans fat, and cholesterol may control cholesterol levels.  Take any prescribed medicines to control cholesterol as directed by your health care provider.  Manage your diabetes.  Controlling your carbohydrate and sugar intake is recommended to manage diabetes.  Take any prescribed medicines to control diabetes as directed by your health care  provider.  Control your hypertension.  Making food choices that are low in salt (sodium), saturated fat, trans fat, and cholesterol is recommended to manage hypertension.  Ask your health care provider if you need treatment to lower your blood pressure. Take any prescribed medicines to control hypertension as directed by your health care provider.  If you are 31-30 years of age, have your blood pressure checked every 3-5 years. If you are 25 years of age or older, have your blood pressure checked every year.  Maintain a healthy weight.  Reducing calorie intake and making food choices that are low in sodium, saturated fat, trans fat, and cholesterol are recommended to manage weight.  Stop drug abuse.  Avoid taking birth control pills.  Talk to your health care provider about the risks of taking birth control pills if you are over 16 years old, smoke, get migraines, or have ever had a blood clot.  Get evaluated for sleep disorders (sleep apnea).  Talk to your health care provider about getting a sleep evaluation if you snore a lot or have excessive sleepiness.  Take medicines only as directed by your health care provider.  For some people, aspirin or blood thinners (anticoagulants) are helpful in reducing the risk of forming abnormal blood clots that can lead to stroke. If you have the irregular heart rhythm of atrial fibrillation, you should be on a blood thinner unless there is a good reason you cannot take them.  Understand all your medicine instructions.  Make sure that other conditions (such as anemia or atherosclerosis) are addressed. SEEK IMMEDIATE MEDICAL CARE IF:   You have sudden weakness or numbness  of the face, arm, or leg, especially on one side of the body.  Your face or eyelid droops to one side.  You have sudden confusion.  You have trouble speaking (aphasia) or understanding.  You have sudden trouble seeing in one or both eyes.  You have sudden trouble  walking.  You have dizziness.  You have a loss of balance or coordination.  You have a sudden, severe headache with no known cause.  You have new chest pain or an irregular heartbeat. Any of these symptoms may represent a serious problem that is an emergency. Do not wait to see if the symptoms will go away. Get medical help at once. Call your local emergency services (911 in [place] Do not drive yourself to the hospital.   This information is not intended to replace advice given to you by your health care provider. Make sure you discuss any questions you have with your health care provider.   Document Released: 09/23/2004 Document Revised: 09/06/2014 Document Reviewed: 02/16/2013 Elsevier Interactive Patient Education 2016 [place] in the Home  Falls can cause injuries. They can happen to people of all ages. There are many things you can do to make your home safe and to help prevent falls.  WHAT CAN I DO ON THE OUTSIDE OF MY HOME?  Regularly fix the edges of walkways and driveways and fix any cracks.  Remove anything that might make you trip as you walk through a door, such as a raised step or threshold.  Trim any bushes or trees on the path to your home.  Use bright outdoor lighting.  Clear any walking paths of anything that might make someone trip, such as rocks or tools.  Regularly check to see if handrails are loose or broken. Make sure that both sides of any steps have handrails.  Any raised decks and porches should have guardrails on the edges.  Have any leaves, snow, or ice cleared regularly.  Use sand or salt on walking paths during winter.  Clean up any spills in your garage right away. This includes oil or grease spills. WHAT CAN I DO IN THE BATHROOM?   Use night lights.  Install grab bars by the toilet and in the tub and shower. Do not use towel bars as grab bars.  Use non-skid mats or decals in the tub or shower.  If you need to sit down  in the shower, use a plastic, non-slip stool.  Keep the floor dry. Clean up any water that spills on the floor as soon as it happens.  Remove soap buildup in the tub or shower regularly.  Attach bath mats securely with double-sided non-slip rug tape.  Do not have throw rugs and other things on the floor that can make you trip. WHAT CAN I DO IN THE BEDROOM?  Use night lights.  Make sure that you have a light by your bed that is easy to reach.  Do not use any sheets or blankets that are too big for your bed. They should not hang down onto the floor.  Have a firm chair that has side arms. You can use this for support while you get dressed.  Do not have throw rugs and other things on the floor that can make you trip. WHAT CAN I DO IN THE KITCHEN?  Clean up any spills right away.  Avoid walking on wet floors.  Keep items that you use a lot in easy-to-reach places.  If you need to  reach something above you, use a strong step stool that has a grab bar.  Keep electrical cords out of the way.  Do not use floor polish or wax that makes floors slippery. If you must use wax, use non-skid floor wax.  Do not have throw rugs and other things on the floor that can make you trip. WHAT CAN I DO WITH MY STAIRS?  Do not leave any items on the stairs.  Make sure that there are handrails on both sides of the stairs and use them. Fix handrails that are broken or loose. Make sure that handrails are as long as the stairways.  Check any carpeting to make sure that it is firmly attached to the stairs. Fix any carpet that is loose or worn.  Avoid having throw rugs at the top or bottom of the stairs. If you do have throw rugs, attach them to the floor with carpet tape.  Make sure that you have a light switch at the top of the stairs and the bottom of the stairs. If you do not have them, ask someone to add them for you. WHAT ELSE CAN I DO TO HELP PREVENT FALLS?  Wear shoes that:  Do not have high  heels.  Have rubber bottoms.  Are comfortable and fit you well.  Are closed at the toe. Do not wear sandals.  If you use a stepladder:  Make sure that it is fully opened. Do not climb a closed stepladder.  Make sure that both sides of the stepladder are locked into place.  Ask someone to hold it for you, if possible.  Clearly mark and make sure that you can see:  Any grab bars or handrails.  First and last steps.  Where the edge of each step is.  Use tools that help you move around (mobility aids) if they are needed. These include:  Canes.  Walkers.  Scooters.  Crutches.  Turn on the lights when you go into a dark area. Replace any light bulbs as soon as they burn out.  Set up your furniture so you have a clear path. Avoid moving your furniture around.  If any of your floors are uneven, fix them.  If there are any pets around you, be aware of where they are.  Review your medicines with your doctor. Some medicines can make you feel dizzy. This can increase your chance of falling. Ask your doctor what other things that you can do to help prevent falls.   This information is not intended to replace advice given to you by your health care provider. Make sure you discuss any questions you have with your health care provider.   Document Released: 06/12/2009 Document Revised: 12/31/2014 Document Reviewed: 09/20/2014 Elsevier Interactive Patient Education [employer].

## 2015-08-07 NOTE — Progress Notes (Signed)
[facility] [address] [place]. [place] [address] [phone]       OFFICE FOLLOW-UP NOTE  [name] Date of Birth:  [birth year] Medical Record Number:  [id number]   HPI: 39 year lady seen today for first office follow-up visit following hospital admission for stroke in October 2016.Kim Mullins is an 81 y.o. female who was at church doing yoga today and felt normal. At 1200 she left her yoga class and noted she could not see out the right aspect of her vision (LKW 1200 06/18/2015). She drove to her opthalmologist and she was sent to [place] for fear of stroke. While in ED code stroke was called due to right hemianopsia. She was within the window and tPA was administered due to visual field cut. Modified Rankin: Rankin Score=0 She was admitted to 80[facility] for further evaluation and treatment. She was given IV TPA and showed significant improvement. CT scan of the head on admission showed no acute infarct patient could not have an MRI due to pacemaker repeat CT angiogram done 24 hours later showed no large vessel occlusion and no evidence of acute occipital infarct. Transthoracic echo showed normal ejection fraction. Carotid Doppler showed no significant extra cranial stenosis. LDL cholesterol is 50 mg percent and hemoglobin A1c was 5.8. Patient was on aspirin 81 mg and this was changed to 325 mg and Plavix 75 mg daily. Patient states she's done well since discharge her vision is completely back to normal. She has had no new symptoms. She in fact had Surgery done 2 weeks ago which went well. She is having some minor bruises on Plavix. She has been active and has started doing water aerobics. Blood pressure is well controlled and 96/60 today.   ROS:   14 system review of systems is positive for  fatigue, hearing loss, ringing in the ears, itching, shortness of breath, constipation anemia and joint pain allergies runny nose degrees energy and all systems  negative  PMH:  Past Medical History  Diagnosis Date  . Bowel obstruction ([place])   . Left tibial fracture 2007  . Asthma     Allergixc reaction to cats.  . Anemia     occassionally  . Arthritis     all over.  . Diabetes mellitus 2006    Diet and exercise controlled.  Marland Kitchen GERD (gastroesophageal reflux disease)     occ  . Osteoarthritis of right shoulder region 06/26/2013  . DCM (dilated cardiomyopathy) ([place]) 08/16/2014    normal coronary arteries on cath with EF 30-45%.  EF now 50% by echo 11/2014  . Chronic systolic CHF (congestive heart failure) (HCC)     EF 30-35% by cath  . Complete heart block (HCC)     a. s/p MDT CRTP pacemaker  . OSA (obstructive sleep apnea) 10/23/2014    Moderate with AHI 21/hr  . Hypertension   . Stroke [facility])     Social History:  Social History   Social History  . Marital Status: Widowed    Spouse Name: N/A  . Number of Children: 1  . Years of Education: N/A   Occupational History  . retired    Social History Main Topics  . Smoking status: Former Smoker -- 1.00 packs/day for 45 years    Types: Cigarettes    Quit date: 07/24/1978  . Smokeless tobacco: Never Used  . Alcohol Use: 4.2 oz/week    7 Glasses of wine per week     Comment: every day  .  Drug Use: No  . Sexual Activity: Not Currently   Other Topics Concern  . Not on file   Social History Narrative    Medications:   Current Outpatient Prescriptions on File Prior to Visit  Medication Sig Dispense Refill  . Acetaminophen (TYLENOL) 325 MG CAPS Take 1 tablet by mouth daily as needed.    . Ascorbic Acid (VITAMIN C PO) Take 1 tablet by mouth daily.    . Bisacodyl (LAXATIVE PO) Take by mouth as directed.    Marland Kitchen CALCIUM PO Take 1 tablet by mouth daily.    . carvedilol (COREG) 3.125 MG tablet TAKE 1 TABLET BY MOUTH 2 TIMES DAILY WITH A MEAL. 60 tablet 9  . clopidogrel (PLAVIX) 75 MG tablet Take 1 tablet (75 mg total) by mouth daily. 30 tablet 2  . diclofenac sodium (VOLTAREN) 1 % GEL  Apply 1 application topically 3 (three) times daily as needed (pain, inflammation).   0  . diphenhydramine-acetaminophen (TYLENOL PM) 25-500 MG TABS tablet Take 1 tablet by mouth at bedtime as needed.    . dorzolamide-timolol (COSOPT) 22.3-6.8 MG/ML ophthalmic solution Place 1 drop into both eyes 2 (two) times daily.    . furosemide (LASIX) 40 MG tablet TAKE 1 TABLET (40 MG TOTAL) BY MOUTH DAILY. 30 tablet 5  . PREDNISOLONE ACETATE OP Place 1 drop into both eyes. 4 times daily for 1 month then 2 times daily for 1 week     No current facility-administered medications on file prior to visit.    Allergies:   Allergies  Allergen Reactions  . Hydrocodone Other (See Comments)    Took away all energy and made her stop eating food for short time  . Percocet [Oxycodone-Acetaminophen] Other (See Comments)    Too away all energy and made her stop eating food for short time  . Mercurial Derivatives   . Eggs Or Egg-Derived Products Hives and Rash  . Penicillins Hives and Rash    Has patient had a PCN reaction causing immediate rash, facial/tongue/throat swelling, SOB or lightheadedness with hypotension:  Has patient had a PCN reaction causing severe rash involving mucus membranes or skin necrosis:  Has patient had a PCN reaction that required hospitalization  Has patient had a PCN reaction occurring within the last 10 years:  If all of the above answers are "NO", then may proceed with Cephalosporin use.  . Quinine Derivatives Hives and Rash    Physical Exam General: well developed, well nourished, seated, in no evident distress Head: head normocephalic and atraumatic.  Neck: supple with no carotid or supraclavicular bruits Cardiovascular: regular rate and rhythm, no murmurs Musculoskeletal: no deformity Skin:  no rash/petichiae Vascular:  Normal pulses all extremities Filed Vitals:   08/07/15 1406  BP: 96/65  Pulse: 67   Neurologic Exam Mental Status: Awake and fully alert. Oriented to  place and time. Recent and remote memory intact. Attention span, concentration and fund of knowledge appropriate. Mood and affect appropriate.  Cranial Nerves: Fundoscopic exam reveals sharp disc margins. Pupils equal, briskly reactive to light. Extraocular movements full without nystagmus. Visual fields full to confrontation. Hearing intact. Facial sensation intact. Face, tongue, palate moves normally and symmetrically.  Motor: Normal bulk and tone. Normal strength in all tested extremity muscles. Sensory.: intact to touch ,pinprick .position and vibratory sensation.  Coordination: Rapid alternating movements normal in all extremities. Finger-to-nose and heel-to-shin performed accurately bilaterally. Gait and Station: Arises from chair without difficulty. Stance is broad based mild retropulsion. Tends to fall backwards  l.  Reflexes: 1+ and symmetric. Toes downgoing.     ASSESSMENT: 49 year with suspected left occipital infarct in October [id number] of embolic etiology without identified source stated with IV TPA with excellent clinical recovery. Vascular risk factors of diabetes    PLAN: I had a long d/w patient about her recent stroke, risk for recurrent stroke/TIAs, personally independently reviewed imaging studies and stroke evaluation results and answered questions.Continue Plavix  for secondary stroke prevention and maintain strict control of hypertension with blood pressure goal below 130/90, diabetes with hemoglobin A1c goal below 6.5% and lipids with LDL cholesterol goal below 70 mg/dL. I also advised the patient to eat a healthy diet with plenty of whole grains, cereals, fruits and vegetables, exercise regularly and maintain ideal body weight .we also talked about fall and safety precautions.Greater than 50% of time during this 25 minute visit was spent on counseling,explanation of diagnosis, planning of further management, discussion with patient and family and coordination of care  Followup in  the future with me in 6 months or call earlier if necessary. Antony Contras, MD Note: This document was prepared with digital dictation and possible smart phrase technology. Any transcriptional errors that result from this process are unintentional

## 2015-08-13 LAB — CUP PACEART REMOTE DEVICE CHECK
Battery Remaining Longevity: 51 mo
Brady Statistic AP VP Percent: 29.14 %
Brady Statistic AP VS Percent: 0.03 %
Brady Statistic RA Percent Paced: 29.17 %
Brady Statistic RV Percent Paced: 99.88 %
Date Time Interrogation Session: 20161205213848
Implantable Lead Location: 753859
Lead Channel Impedance Value: 418 Ohm
Lead Channel Impedance Value: 456 Ohm
Lead Channel Impedance Value: 684 Ohm
Lead Channel Impedance Value: 760 Ohm
Lead Channel Impedance Value: 969 Ohm
Lead Channel Pacing Threshold Amplitude: 0.375 V
Lead Channel Pacing Threshold Amplitude: 0.625 V
Lead Channel Pacing Threshold Pulse Width: 0.4 ms
Lead Channel Sensing Intrinsic Amplitude: 2.375 mV
Lead Channel Sensing Intrinsic Amplitude: 2.375 mV
Lead Channel Setting Pacing Amplitude: 2 V
Lead Channel Setting Pacing Pulse Width: 0.4 ms
Lead Channel Setting Sensing Sensitivity: 4 mV
MDC IDC LEAD IMPLANT DT: 20160208
MDC IDC LEAD IMPLANT DT: 20160208
MDC IDC LEAD LOCATION: 753859
MDC IDC MSMT BATTERY VOLTAGE: 2.99 V
MDC IDC MSMT LEADCHNL LV IMPEDANCE VALUE: 475 Ohm
MDC IDC MSMT LEADCHNL LV PACING THRESHOLD AMPLITUDE: 1.375 V
MDC IDC MSMT LEADCHNL LV PACING THRESHOLD PULSEWIDTH: 0.8 ms
MDC IDC MSMT LEADCHNL RA IMPEDANCE VALUE: 323 Ohm
MDC IDC MSMT LEADCHNL RA IMPEDANCE VALUE: 456 Ohm
MDC IDC MSMT LEADCHNL RV IMPEDANCE VALUE: 418 Ohm
MDC IDC MSMT LEADCHNL RV PACING THRESHOLD PULSEWIDTH: 0.4 ms
MDC IDC MSMT LEADCHNL RV SENSING INTR AMPL: 5.125 mV
MDC IDC MSMT LEADCHNL RV SENSING INTR AMPL: 5.125 mV
MDC IDC SET LEADCHNL LV PACING AMPLITUDE: 2.75 V
MDC IDC SET LEADCHNL LV PACING PULSEWIDTH: 0.8 ms
MDC IDC SET LEADCHNL RV PACING AMPLITUDE: 2.5 V
MDC IDC STAT BRADY AS VP PERCENT: 70.74 %
MDC IDC STAT BRADY AS VS PERCENT: 0.09 %

## 2015-08-20 ENCOUNTER — Encounter: Payer: Self-pay | Admitting: Cardiology

## 2015-08-21 NOTE — Telephone Encounter (Signed)
Called pt and left message on her voicemail for her to return my call.

## 2015-08-21 NOTE — Telephone Encounter (Signed)
Rescheduled pt appt to 09-08-15 at 8:00 AM

## 2015-08-29 ENCOUNTER — Encounter: Payer: Medicare Other | Admitting: Physician Assistant

## 2015-09-07 NOTE — Progress Notes (Signed)
Cardiology Office Note Date:  09/07/2015  Patient ID:  Kim Mullins, Kim Mullins February 01, 1930, MRN [id number] PCP:  Kristine Garbe, MD  Cardiologist:  Dr. Radford Pax Electrophysiologist: Dr. Lovena Le  refresh   Chief Complaint: AF found on PPM  Remote done Dec.  History of Present Illness: Kim Mullins is a 82 y.o. female with history of asthma, GERD/esophageal stricture s/p dilation, DM/diet controlled, NICM with improved LVEF at last eval, VHD/MR, mild-mod p.HTN, OSA/CPAP, CHB with CRT-P, CVA in Oct 2016 treated with tPA with resolution of her visual defect, comes to the office today**  Patient with new PAfib noted on her remote transmission in December, timing of this event is noted in October, since then she has had** by today's check  CHADS2Vasc is at least 7 ** Hematological or bleeding history  ** anemia mentioned in PMHx  She was seen in [place] Mullins with cardiology doing well, and neurology last month also doing well from their standpoint.  Past Medical History  Diagnosis Date  . Bowel obstruction ([place])   . Left tibial fracture 2007  . Asthma     Allergixc reaction to cats.  . Anemia     occassionally  . Arthritis     all over.  . Diabetes mellitus 2006    Diet and exercise controlled.  Marland Kitchen GERD (gastroesophageal reflux disease)     occ  . Osteoarthritis of right shoulder region 06/26/2013  . DCM (dilated cardiomyopathy) ([place]) 08/16/2014    normal coronary arteries on cath with EF 30-45%.  EF now 50% by echo 11/2014  . Chronic systolic CHF (congestive heart failure) (HCC)     EF 30-35% by cath  . Complete heart block (HCC)     a. s/p MDT CRTP pacemaker  . OSA (obstructive sleep apnea) 10/23/2014    Moderate with AHI 21/hr  . Hypertension   . Stroke [facility])     Past Surgical History  Procedure Laterality Date  . Abdominal hysterectomy  1969  . Dilation and curettage of uterus  1968  . Pilonidal cyst excision  1959  . Corn removal  1999    Bilateral feet  .  Bunionectomy  1984    Bilateral  . Tonsillectomy  1942  . Eye surgery  1983    Surgery to fix Retainal detachment, bilateral  . Carpal tunnell  2004    Bilateral  . Total hip arthroplasty  11/16/2011    Procedure: TOTAL HIP ARTHROPLASTY ANTERIOR APPROACH;  Surgeon: Hessie Dibble, MD;  Location: [facility];  Service: Orthopedics;  Laterality: Right;  DEPUY  . Total shoulder arthroplasty Right 06/26/2013    Procedure: TOTAL SHOULDER ARTHROPLASTY;  Surgeon: Johnny Bridge, MD;  Location: [facility];  Service: Orthopedics;  Laterality: Right;  . Cataract extraction Right     early 2015  . Joint replacement    . Esophagogastroduodenoscopy (egd) with propofol N/A 06/11/2014    Procedure: ESOPHAGOGASTRODUODENOSCOPY (EGD) WITH PROPOFOL;  Surgeon: Inda Castle, MD;  Location: [facility];  Service: Endoscopy;  Laterality: N/A;  . Bravo ph study N/A 06/11/2014    Procedure: BRAVO Joshua STUDY;  Surgeon: Inda Castle, MD;  Location: [facility] ENDOS[facility];  Service: Endoscopy;  Laterality: N/A;  Venia Minks dilation  06/11/2014    Procedure: Venia Minks DILATION;  Surgeon: Inda Castle, MD;  Location: [facility]OS[facility];  Service: Endoscopy;;  . Left and right heart catheterization with coronary angiogram N/A 08/29/2014    Procedure: LEFT AND RIGHT HEART CATHETERIZATION WITH CORONARY ANGIOGRAM;  Surgeon:  Peter M Martinique, MD;  Location: [facility] CATH LAB;  Service: Cardiovascular;  Laterality: N/A;  . Bi-ventricular pacemaker insertion N/A 10/07/2014    MDT CRTP implanted by Dr Lovena Le  . Cardiac catheterization      normal coronary arteries    Current Outpatient Prescriptions  Medication Sig Dispense Refill  . Acetaminophen (TYLENOL) 325 MG CAPS Take 1 tablet by mouth daily as needed.    . Ascorbic Acid (VITAMIN C PO) Take 1 tablet by mouth daily.    . Bisacodyl (LAXATIVE PO) Take by mouth as directed.    Marland Kitchen CALCIUM PO Take 1 tablet by mouth daily.    . carvedilol (COREG) 3.125 MG tablet TAKE 1 TABLET BY MOUTH 2 TIMES DAILY  WITH A MEAL. 60 tablet 9  . clopidogrel (PLAVIX) 75 MG tablet Take 1 tablet (75 mg total) by mouth daily. 30 tablet 2  . diclofenac sodium (VOLTAREN) 1 % GEL Apply 1 application topically 3 (three) times daily as needed (pain, inflammation).   0  . diphenhydramine-acetaminophen (TYLENOL PM) 25-500 MG TABS tablet Take 1 tablet by mouth at bedtime as needed.    . dorzolamide (TRUSOPT) 2 % ophthalmic solution   11  . dorzolamide-timolol (COSOPT) 22.3-6.8 MG/ML ophthalmic solution Place 1 drop into both eyes 2 (two) times daily.    . furosemide (LASIX) 40 MG tablet TAKE 1 TABLET (40 MG TOTAL) BY MOUTH DAILY. 30 tablet 5  . PREDNISOLONE ACETATE OP Place 1 drop into both eyes. 4 times daily for 1 month then 2 times daily for 1 week    . timolol (TIMOPTIC) 0.5 % ophthalmic solution   11   No current facility-administered medications for this visit.    Allergies:   Hydrocodone; Percocet; Mercurial derivatives; Eggs or egg-derived products; Penicillins; and Quinine derivatives   Social History:  The patient  reports that she quit smoking about 37 years ago. Her smoking use included Cigarettes. She has a 45 pack-year smoking history. She has never used smokeless tobacco. She reports that she drinks about 4.2 oz of alcohol per week. She reports that she does not use illicit drugs.   Family History:  The patient's family history includes Anuerysm in her daughter; Cancer in her father; Coronary artery disease in her mother; Dementia in her mother; Diabetes in her maternal grandfather. There is no history of Colon cancer.  ROS:  Please see the history of present illness.  All other systems are reviewed and otherwise negative.   PHYSICAL EXAM: ** VS:  There were no vitals taken for this visit. BMI: There is no weight on file to calculate BMI. Well nourished, well developed, in no acute distress HEENT: normocephalic, atraumatic Neck: no JVD, carotid bruits or masses Cardiac:  normal S1, S2; RRR; no  significant murmurs, no rubs, or gallops Lungs:  clear to auscultation bilaterally, no wheezing, rhonchi or rales Abd: soft, nontender,  + BS MS: no deformity or atrophy Ext: no edema Skin: warm and dry, no rash Neuro:  No gross deficits appreciated Psych: euthymic mood, full affect  ** PPM site is stable, no tethering or discomfort   EKG:  Done today shows **  12/06/14: Echocardiogram Study Conclusions - Left ventricle: The cavity size was normal. Wall thickness was increased in a pattern of mild LVH. Systolic function was normal. The estimated ejection fraction is 50%. Doppler parameters are consistent with abnormal left ventricular relaxation (grade 1 diastolic dysfunction). - Mitral valve: There was mild regurgitation. - Left atrium: The atrium was mildly  dilated. - Right atrium: The atrium was mildly dilated.  07/17/14 Echocardiogram Study Conclusions - Left ventricle: The cavity size was normal. Wall thickness was increased in a pattern of moderate LVH. Systolic function was mildly to moderately reduced. The estimated ejection fraction was in the range of 40% to 45%. Severe hypokinesis of the inferior myocardium. - Mitral valve: There was moderate regurgitation. - Left atrium: The atrium was mildly to moderately dilated. - Right atrium: The atrium was mildly dilated. - Pulmonic valve: There was moderate regurgitation. - Pulmonary arteries: Systolic pressure was moderately increased. PA peak pressure: 47 mm Hg (S).   Recent Labs: 10/06/2014: Magnesium 2.2 01/24/2015: Pro B Natriuretic peptide (BNP) 263.0* 06/18/2015: ALT 10*; BUN 20; Creatinine, Ser 1.10*; Hemoglobin 13.9; Platelets 232; Potassium 4.5; Sodium 140  06/19/2015: Cholesterol 110; HDL 49; LDL Cholesterol 50; Total CHOL/HDL Ratio 2.2; Triglycerides 55; VLDL 11   CrCl cannot be calculated (Unknown ideal weight.).   Wt Readings from Last 3 Encounters:  08/07/15 177 lb 12.8 oz (80.65 kg)    07/23/15 175 lb (79.379 kg)  06/18/15 178 lb 12.7 oz (81.1 kg)     Other studies reviewed: Additional studies/records reviewed today include: summarized above**  DEVICE information: MDT CRT-P implanted 10/07/14 by Dr. Lovena Le for CHB  ASSESSMENT AND PLAN: ** site check ** remotes  1. New PAFib CHADS2Vasc is at least 7 ** disucssed at length embolic risk and stroke prevention, risk/benefit of full a/c ** pt is agreeable to proceed, we will start ** Eliquis 5mg  BID 06/18/15: BUN/Creat 20/1.10, H/H 13.9/41.0, plts 232 ** counseled on improtance of CPAP compliance ** counseled on bleeding/signs of bleeding  2. CHB, hx of NICM with improved EF MDT CRT-P  3. HTN   Disposition: F/u with EP APP in 1 month given new start of a/c **  Current medicines are reviewed at length with the patient today.  The patient did not have any concerns regarding medicines.**  Haywood Lasso, PA-C 09/07/2015 2:58 PM     [facility] [address] [place] [address] [phone] (office)  [phone] (fax)    This encounter was created in error - please disregard.

## 2015-09-08 ENCOUNTER — Encounter: Payer: Medicare Other | Admitting: Physician Assistant

## 2015-09-10 ENCOUNTER — Encounter: Payer: Self-pay | Admitting: Physician Assistant

## 2015-09-10 ENCOUNTER — Ambulatory Visit (INDEPENDENT_AMBULATORY_CARE_PROVIDER_SITE_OTHER): Payer: Medicare Other | Admitting: Podiatry

## 2015-09-10 ENCOUNTER — Ambulatory Visit (INDEPENDENT_AMBULATORY_CARE_PROVIDER_SITE_OTHER): Payer: Medicare Other | Admitting: Physician Assistant

## 2015-09-10 ENCOUNTER — Encounter: Payer: Self-pay | Admitting: Podiatry

## 2015-09-10 ENCOUNTER — Encounter: Payer: Medicare Other | Admitting: Physician Assistant

## 2015-09-10 VITALS — BP 112/76 | HR 76 | Ht 63.0 in | Wt 172.0 lb

## 2015-09-10 VITALS — BP 112/78 | HR 87 | Resp 12

## 2015-09-10 DIAGNOSIS — S90812A Abrasion, left foot, initial encounter: Secondary | ICD-10-CM

## 2015-09-10 DIAGNOSIS — I10 Essential (primary) hypertension: Secondary | ICD-10-CM

## 2015-09-10 DIAGNOSIS — I442 Atrioventricular block, complete: Secondary | ICD-10-CM

## 2015-09-10 DIAGNOSIS — I4891 Unspecified atrial fibrillation: Secondary | ICD-10-CM | POA: Diagnosis not present

## 2015-09-10 NOTE — Progress Notes (Signed)
Cardiology Office Note Date: 09/07/2015  Patient ID: Kim, Mullins 1930/05/26, MRN [id number] PCP: Kristine Garbe, MD Cardiologist: Dr. Radford Pax Electrophysiologist: Dr. Lovena Le   Chief Complaint: AF found on PPM Remote done Dec.  History of Present Illness: Kim Mullins is a 82 y.o. female with history of asthma, GERD/esophageal stricture s/p dilation, DM/diet controlled, NICM with improved LVEF at last eval, VHD/MR, mild-mod p.HTN, OSA/CPAP, CHB with CRT-P, CVA in Oct 2016 treated with tPA with resolution of her visual defect, comes to the office today called for an appointment finding new AFib on her last pacer remote transmission.  She states her vision is back to normal without field deficits, in fact has since had a cataract removed and seeing very well.  She feels well, does yoga, is active and feels outside of slowing with age feels well.  She denies any palpitations, no CP, describes chronic baseline DOE with carrying her groceries, no dizziness, near syncope or syncope.  Patient with new PAfib noted on her remote transmission in December, timing of this event is noted in October, since then she has had none further. She is wearing a "Fall risk" hospirtal bandage, states she has kept in on since her hospital stay to remind herself to be careful.  She walks with the aid of cane, if in bigger stores states she will use walker, has fallen in the past, she says total of maybe twice last year, not frequently and says as long as she has her cane/walking stick she does well. CHADS2Vasc is at least 7 She denies any known Hematological or bleeding history, anemia mentioned in PMHx but unknown specifics, she denies any known issues with bleeding or anemia in the past.  She was seen in [place] with cardiology doing well, and neurology last month also doing well from their standpoint.  Past Medical History  Diagnosis Date  . Bowel obstruction ([place])   . Left tibial  fracture 2007  . Asthma     Allergixc reaction to cats.  . Anemia     occassionally  . Arthritis     all over.  . Diabetes mellitus 2006    Diet and exercise controlled.  Marland Kitchen GERD (gastroesophageal reflux disease)     occ  . Osteoarthritis of right shoulder region 06/26/2013  . DCM (dilated cardiomyopathy) ([place]) 08/16/2014    normal coronary arteries on cath with EF 30-45%. EF now 50% by echo 11/2014  . Chronic systolic CHF (congestive heart failure) ([facility])     EF 30-35% by cath  . Complete heart block ([facility])     a. s/p MDT CRTP pacemaker  . OSA (obstructive sleep apnea) 10/23/2014    Moderate with AHI 21/hr  . Hypertension   . Stroke [facility])     Past Surgical History  Procedure Laterality Date  . Abdominal hysterectomy  1969  . Dilation and curettage of uterus  1968  . Pilonidal cyst excision  1959  . Corn removal  1999    Bilateral feet  . Bunionectomy  1984    Bilateral  . Tonsillectomy  1942  . Eye surgery  1983    Surgery to fix Retainal detachment, bilateral  . Carpal tunnell  2004    Bilateral  . Total hip arthroplasty  11/16/2011    Procedure: TOTAL HIP ARTHROPLASTY ANTERIOR APPROACH; Surgeon: Hessie Dibble, MD; Location: [place]; Service: Orthopedics; Laterality: Right; DEPUY  . Total shoulder arthroplasty Right 06/26/2013    Procedure: TOTAL SHOULDER  ARTHROPLASTY; Surgeon: Johnny Bridge, MD; Location: [place]; Service: Orthopedics; Laterality: Right;  . Cataract extraction Right     early 2015  . Joint replacement    . Esophagogastroduodenoscopy (egd) with propofol N/A 06/11/2014    Procedure: ESOPHAGOGASTRODUODENOSCOPY (EGD) WITH PROPOFOL; Surgeon: Inda Castle, MD; Location: [facility]; Service: Endoscopy; Laterality: N/A;  . Bravo ph study N/A 06/11/2014    Procedure: BRAVO [place] STUDY; Surgeon: Inda Castle, MD; Location: [facility]; Service: Endoscopy; Laterality: N/A;  Venia Minks  dilation  06/11/2014    Procedure: Venia Minks DILATION; Surgeon: Inda Castle, MD; Location: [facility]; Service: Endoscopy;;  . Left and right heart catheterization with coronary angiogram N/A 08/29/2014    Procedure: LEFT AND RIGHT HEART CATHETERIZATION WITH CORONARY ANGIOGRAM; Surgeon: Peter M Martinique, MD; Location: [facility] CATH LAB; Service: Cardiovascular; Laterality: N/A;  . Bi-ventricular pacemaker insertion N/A 10/07/2014    MDT CRTP implanted by Dr Lovena Le  . Cardiac catheterization      normal coronary arteries    Current Outpatient Prescriptions  Medication Sig Dispense Refill  . Acetaminophen (TYLENOL) 325 MG CAPS Take 1 tablet by mouth daily as needed.    . Ascorbic Acid (VITAMIN C PO) Take 1 tablet by mouth daily.    . Bisacodyl (LAXATIVE PO) Take by mouth as directed.    Marland Kitchen CALCIUM PO Take 1 tablet by mouth daily.    . carvedilol (COREG) 3.125 MG tablet TAKE 1 TABLET BY MOUTH 2 TIMES DAILY WITH A MEAL. 60 tablet 9  . clopidogrel (PLAVIX) 75 MG tablet Take 1 tablet (75 mg total) by mouth daily. 30 tablet 2  . diclofenac sodium (VOLTAREN) 1 % GEL Apply 1 application topically 3 (three) times daily as needed (pain, inflammation).   0  . diphenhydramine-acetaminophen (TYLENOL PM) 25-500 MG TABS tablet Take 1 tablet by mouth at bedtime as needed.    . dorzolamide (TRUSOPT) 2 % ophthalmic solution   11  . dorzolamide-timolol (COSOPT) 22.3-6.8 MG/ML ophthalmic solution Place 1 drop into both eyes 2 (two) times daily.    . furosemide (LASIX) 40 MG tablet TAKE 1 TABLET (40 MG TOTAL) BY MOUTH DAILY. 30 tablet 5  . PREDNISOLONE ACETATE OP Place 1 drop into both eyes. 4 times daily for 1 month then 2 times daily for 1 week    . timolol (TIMOPTIC) 0.5 % ophthalmic solution   11   No current facility-administered medications for this visit.    Allergies: Hydrocodone; Percocet; Mercurial derivatives; Eggs or egg-derived products; Penicillins; and  Quinine derivatives   Social History: The patient  reports that she quit smoking about 37 years ago. Her smoking use included Cigarettes. She has a 45 pack-year smoking history. She has never used smokeless tobacco. She reports that she drinks about 4.2 oz of alcohol per week. She reports that she does not use illicit drugs.   Family History: The patient's family history includes Anuerysm in her daughter; Cancer in her father; Coronary artery disease in her mother; Dementia in her mother; Diabetes in her maternal grandfather. There is no history of Colon cancer.  ROS: Please see the history of present illness.  All other systems are reviewed and otherwise negative.   PHYSICAL EXAM:  VS: 112/76, 172lbs, 76bpm, 5'3" Well nourished, well developed, in no acute distress  HEENT: normocephalic, atraumatic  Neck: no JVD, carotid bruits or masses Cardiac: normal S1, S2; RRR; no significant murmurs, no rubs, or gallops Lungs: clear to auscultation bilaterally, no wheezing, rhonchi or rales  Abd: soft, nontender MS: no deformity or atrophy Ext: no edema  Skin: warm and dry, no rash Neuro: No gross deficits appreciated Psych: euthymic mood, full affect  PPM site is stable, no tethering or discomfort   EKG: Done today shows AV paced rhythm PPM check today shows normal function, 06/13/15 AFlutter/fib episode of 2hrs27min duration, 99.4% BiVe pacing, NSVT 0.01sec x5  12/06/14: Echocardiogram Study Conclusions - Left ventricle: The cavity size was normal. Wall thickness was increased in a pattern of mild LVH. Systolic function was normal. The estimated ejection fraction is 50%. Doppler parameters are consistent with abnormal left ventricular relaxation (grade 1 diastolic dysfunction). - Mitral valve: There was mild regurgitation. - Left atrium: The atrium was mildly dilated. (51mm) - Right atrium: The atrium was mildly dilated.  07/17/14 Echocardiogram Study Conclusions -  Left ventricle: The cavity size was normal. Wall thickness was increased in a pattern of moderate LVH. Systolic function was mildly to moderately reduced. The estimated ejection fraction was in the range of 40% to 45%. Severe hypokinesis of the inferior myocardium. - Mitral valve: There was moderate regurgitation. - Left atrium: The atrium was mildly to moderately dilated. - Right atrium: The atrium was mildly dilated. - Pulmonic valve: There was moderate regurgitation. - Pulmonary arteries: Systolic pressure was moderately increased. PA peak pressure: 47 mm Hg (S).   Recent Labs: 10/06/2014: Magnesium 2.2 01/24/2015: Pro B Natriuretic peptide (BNP) 263.0* 06/18/2015: ALT 10*; BUN 20; Creatinine, Ser 1.10*; Hemoglobin 13.9; Platelets 232; Potassium 4.5; Sodium 140  06/19/2015: Cholesterol 110; HDL 49; LDL Cholesterol 50; Total CHOL/HDL Ratio 2.2; Triglycerides 55; VLDL 11   CrCl cannot be calculated (Unknown ideal weight.).   Wt Readings from Last 3 Encounters:  08/07/15 177 lb 12.8 oz (80.65 kg)  07/23/15 175 lb (79.379 kg)  06/18/15 178 lb 12.7 oz (81.1 kg)     Other studies reviewed: Additional studies/records reviewed today include: summarized above  DEVICE information: MDT CRT-P implanted 10/07/14 by Dr. Lovena Le for CHB  ASSESSMENT AND PLAN:  1. New PAFib CHADS2Vasc is at least 7 disucssed at length embolic risk and stroke prevention, risk/benefit of full a/c pt at this time though is not completely agreeable to proceed and first would like to discuss with her grandaughter, (offerred to discuss with her grand daughter as well if she would like) and then see Dr. Lovena Le to discuss further she is given an appointment to see him Friday She is given written information on atrial fibrillation, she would like to do some personal research into the idea of changing her to full anticoagulation and given the names of some of the OAC's to look into. 06/18/15: BUN/Creat 20/1.10,  H/H 13.9/41.0, plts 232 counseled on importance of CPAP compliance she reports using CPAP most nights  2. CHB, hx of NICM with improved EF MDT CRT-P  3. HTN  Appears well controlled   Disposition: F/u with Dr. Lovena Le on Friday.  Current medicines are reviewed at length with the patient today. The patient did not have any concerns regarding medicines.  Haywood Lasso, PA-C 09/07/2015 2:58 PM   [address] [place]  [place] [address] [phone] (office)  [phone] (fax)

## 2015-09-10 NOTE — Patient Instructions (Addendum)
Medication Instructions:   Your physician recommends that you continue on your current medications as directed. Please refer to the Current Medication list given to you today.'  If you need a refill on your cardiac medications before your next appointment, please call your pharmacy.  Labwork: NONE ORDER TODAY  Testing/Procedures: NONE ORDER TODAY   Follow-Up: WITH DR Lovena Le  Friday  @ 10:15  PER KELLY    Any Other Special Instructions Will Be Listed Below (If Applicable).  Atrial Fibrillation Atrial fibrillation is a type of irregular or rapid heartbeat (arrhythmia). In atrial fibrillation, the heart quivers continuously in a chaotic pattern. This occurs when parts of the heart receive disorganized signals that make the heart unable to pump blood normally. This can increase the risk for stroke, heart failure, and other heart-related conditions. There are different types of atrial fibrillation, including:  Paroxysmal atrial fibrillation. This type starts suddenly, and it usually stops on its own shortly after it starts.  Persistent atrial fibrillation. This type often lasts longer than a week. It may stop on its own or with treatment.  Long-lasting persistent atrial fibrillation. This type lasts longer than 12 months.  Permanent atrial fibrillation. This type does not go away. Talk with your health care provider to learn about the type of atrial fibrillation that you have. CAUSES This condition is caused by some heart-related conditions or procedures, including:  A heart attack.  Coronary artery disease.  Heart failure.  Heart valve conditions.  High blood pressure.  Inflammation of the sac that surrounds the heart (pericarditis).  Heart surgery.  Certain heart rhythm disorders, such as Wolf-Parkinson-White syndrome. Other causes include:  Pneumonia.  Obstructive sleep apnea.  Blockage of an artery in the lungs (pulmonary embolism, or PE).  Lung cancer.  Chronic  lung disease.  Thyroid problems, especially if the thyroid is overactive (hyperthyroidism).  Caffeine.  Excessive alcohol use or illegal drug use.  Use of some medicines, including certain decongestants and diet pills. Sometimes, the cause cannot be found. RISK FACTORS This condition is more likely to develop in:  People who are older in age.  People who smoke.  People who have diabetes mellitus.  People who are overweight (obese).  Athletes who exercise vigorously. SYMPTOMS Symptoms of this condition include:  A feeling that your heart is beating rapidly or irregularly.  A feeling of discomfort or pain in your chest.  Shortness of breath.  Sudden light-headedness or weakness.  Getting tired easily during exercise. In some cases, there are no symptoms. DIAGNOSIS Your health care provider may be able to detect atrial fibrillation when taking your pulse. If detected, this condition may be diagnosed with:  An electrocardiogram (ECG).  A Holter monitor test that records your heartbeat patterns over a 24-hour period.  Transthoracic echocardiogram (TTE) to evaluate how blood flows through your heart.  Transesophageal echocardiogram (TEE) to view more detailed images of your heart.  A stress test.  Imaging tests, such as a CT scan or chest X-ray.  Blood tests. TREATMENT The main goals of treatment are to prevent blood clots from forming and to keep your heart beating at a normal rate and rhythm. The type of treatment that you receive depends on many factors, such as your underlying medical conditions and how you feel when you are experiencing atrial fibrillation. This condition may be treated with:  Medicine to slow down the heart rate, bring the heart's rhythm back to normal, or prevent clots from forming.  Electrical cardioversion. This is a  procedure that resets your heart's rhythm by delivering a controlled, low-energy shock to the heart through your  skin.  Different types of ablation, such as catheter ablation, catheter ablation with pacemaker, or surgical ablation. These procedures destroy the heart tissues that send abnormal signals. When the pacemaker is used, it is placed under your skin to help your heart beat in a regular rhythm. HOME CARE INSTRUCTIONS  Take over-the counter and prescription medicines only as told by your health care provider.  If your health care provider prescribed a blood-thinning medicine (anticoagulant), take it exactly as told. Taking too much blood-thinning medicine can cause bleeding. If you do not take enough blood-thinning medicine, you will not have the protection that you need against stroke and other problems.  Do not use tobacco products, including cigarettes, chewing tobacco, and e-cigarettes. If you need help quitting, ask your health care provider.  If you have obstructive sleep apnea, manage your condition as told by your health care provider.  Do not drink alcohol.  Do not drink beverages that contain caffeine, such as coffee, soda, and tea.  Maintain a healthy weight. Do not use diet pills unless your health care provider approves. Diet pills may make heart problems worse.  Follow diet instructions as told by your health care provider.  Exercise regularly as told by your health care provider.  Keep all follow-up visits as told by your health care provider. This is important. PREVENTION  Avoid drinking beverages that contain caffeine or alcohol.  Avoid certain medicines, especially medicines that are used for breathing problems.  Avoid certain herbs and herbal medicines, such as those that contain ephedra or ginseng.  Do not use illegal drugs, such as cocaine and amphetamines.  Do not smoke.  Manage your high blood pressure. SEEK MEDICAL CARE IF:  You notice a change in the rate, rhythm, or strength of your heartbeat.  You are taking an anticoagulant and you notice increased  bruising.  You tire more easily when you exercise or exert yourself. SEEK IMMEDIATE MEDICAL CARE IF:  You have chest pain, abdominal pain, sweating, or weakness.  You feel nauseous.  You notice blood in your vomit, bowel movement, or urine.  You have shortness of breath.  You suddenly have swollen feet and ankles.  You feel dizzy.  You have sudden weakness or numbness of the face, arm, or leg, especially on one side of the body.  You have trouble speaking, trouble understanding, or both (aphasia).  Your face or your eyelid droops on one side. These symptoms may represent a serious problem that is an emergency. Do not wait to see if the symptoms will go away. Get medical help right away. Call your local emergency services (911 in the [place] Do not drive yourself to the hospital.   This information is not intended to replace advice given to you by your health care provider. Make sure you discuss any questions you have with your health care provider.   Document Released: 08/16/2005 Document Revised: 05/07/2015 Document Reviewed: 12/11/2014 Elsevier Interactive Patient Education [employer].

## 2015-09-10 NOTE — Progress Notes (Signed)
   Subjective:    Patient ID: Kim Mullins, female    DOB: 15-Jun-1930, 82 y.o.   MRN: [id number]  HPI   This patient presents today with a 2 day history of some bleeding and soreness that she noticed on the posterior left heel without any direct injury that she recalls to the area. She has applied Neosporin to the posterior left heel. The bleeding has stopped, however posterior left heel has some residual soreness she would like to the area evaluated.  Patient said that she is a diabetic since 2006 but has never taken any medication for diabetes Patient denies any history of claudication, amputation or ulceration  Review of Systems  Musculoskeletal: Positive for gait problem.  Skin: Positive for color change.       Objective:   Physical Exam  Orientated 3  DP pulses 2/4 bilaterally PT pulses trace palpable bilaterally Capillary reflex immediate bilaterally  Neurological: Sensation to 10 g monofilament wire intact 2/5 right and 4/5 left Vibratory sensation nonreactive bilaterally Ankle reflexes equal reactive bilaterally  Dermatological: Well-healed surgical scars dorsal first MPJ bilaterally and dorsal fourth right toe Posterior lateral left heel has a superficial abrasion 5 mm in diameter with a granular base without a surrounding erythema, edema, drainage, warmth or malodor  Musculoskeletal: HAV deformity left Hammertoe second left         Assessment & Plan:   Assessment: Decreased posterior tibial pulses bilaterally Peripheral neuropathy Abrasion posterior lateral left heel without clinical sign of infection  Plan: Today review the results of examination with patient today. I advised patient apply topical antibiotic ointment and Band-Aid daily to the area until healed Advised her to continue observe they're she develops any sudden redness, pains, swelling notify office of present to ED  Reevaluate 2 weeks

## 2015-09-10 NOTE — Patient Instructions (Signed)
Today the back of your left heel demonstrates a abrasion (irritation or scrape) Apply topical antibiotic ointment such as Neosporin ointment and a Band-Aid daily until the area heals If you develop any sudden pain, swelling, redness present for further evaluation or emergency department  Diabetes and Foot Care Diabetes may cause you to have problems because of poor blood supply (circulation) to your feet and legs. This may cause the skin on your feet to become thinner, break easier, and heal more slowly. Your skin may become dry, and the skin may peel and crack. You may also have nerve damage in your legs and feet causing decreased feeling in them. You may not notice minor injuries to your feet that could lead to infections or more serious problems. Taking care of your feet is one of the most important things you can do for yourself.  HOME CARE INSTRUCTIONS  Wear shoes at all times, even in the house. Do not go barefoot. Bare feet are easily injured.  Check your feet daily for blisters, cuts, and redness. If you cannot see the bottom of your feet, use a mirror or ask someone for help.  Wash your feet with warm water (do not use hot water) and mild soap. Then pat your feet and the areas between your toes until they are completely dry. Do not soak your feet as this can dry your skin.  Apply a moisturizing lotion or petroleum jelly (that does not contain alcohol and is unscented) to the skin on your feet and to dry, brittle toenails. Do not apply lotion between your toes.  Trim your toenails straight across. Do not dig under them or around the cuticle. File the edges of your nails with an emery board or nail file.  Do not cut corns or calluses or try to remove them with medicine.  Wear clean socks or stockings every day. Make sure they are not too tight. Do not wear knee-high stockings since they may decrease blood flow to your legs.  Wear shoes that fit properly and have enough cushioning. To  break in new shoes, wear them for just a few hours a day. This prevents you from injuring your feet. Always look in your shoes before you put them on to be sure there are no objects inside.  Do not cross your legs. This may decrease the blood flow to your feet.  If you find a minor scrape, cut, or break in the skin on your feet, keep it and the skin around it clean and dry. These areas may be cleansed with mild soap and water. Do not cleanse the area with peroxide, alcohol, or iodine.  When you remove an adhesive bandage, be sure not to damage the skin around it.  If you have a wound, look at it several times a day to make sure it is healing.  Do not use heating pads or hot water bottles. They may burn your skin. If you have lost feeling in your feet or legs, you may not know it is happening until it is too late.  Make sure your health care provider performs a complete foot exam at least annually or more often if you have foot problems. Report any cuts, sores, or bruises to your health care provider immediately. SEEK MEDICAL CARE IF:   You have an injury that is not healing.  You have cuts or breaks in the skin.  You have an ingrown nail.  You notice redness on your legs or feet.  You feel burning or tingling in your legs or feet.  You have pain or cramps in your legs and feet.  Your legs or feet are numb.  Your feet always feel cold. SEEK IMMEDIATE MEDICAL CARE IF:   There is increasing redness, swelling, or pain in or around a wound.  There is a red line that goes up your leg.  Pus is coming from a wound.  You develop a fever or as directed by your health care provider.  You notice a bad smell coming from an ulcer or wound.   This information is not intended to replace advice given to you by your health care provider. Make sure you discuss any questions you have with your health care provider.   Document Released: 08/13/2000 Document Revised: 04/18/2013 Document  Reviewed: 01/23/2013 Elsevier Interactive Patient Education [employer].

## 2015-09-12 ENCOUNTER — Encounter: Payer: Self-pay | Admitting: Internal Medicine

## 2015-09-12 ENCOUNTER — Telehealth: Payer: Self-pay | Admitting: Internal Medicine

## 2015-09-12 ENCOUNTER — Ambulatory Visit (INDEPENDENT_AMBULATORY_CARE_PROVIDER_SITE_OTHER): Payer: Medicare Other | Admitting: Internal Medicine

## 2015-09-12 VITALS — BP 110/84 | HR 72 | Ht 63.0 in | Wt 172.2 lb

## 2015-09-12 DIAGNOSIS — I480 Paroxysmal atrial fibrillation: Secondary | ICD-10-CM

## 2015-09-12 DIAGNOSIS — I5022 Chronic systolic (congestive) heart failure: Secondary | ICD-10-CM

## 2015-09-12 DIAGNOSIS — I4891 Unspecified atrial fibrillation: Secondary | ICD-10-CM | POA: Insufficient documentation

## 2015-09-12 DIAGNOSIS — I442 Atrioventricular block, complete: Secondary | ICD-10-CM

## 2015-09-12 DIAGNOSIS — Z950 Presence of cardiac pacemaker: Secondary | ICD-10-CM | POA: Diagnosis not present

## 2015-09-12 DIAGNOSIS — I10 Essential (primary) hypertension: Secondary | ICD-10-CM

## 2015-09-12 LAB — COMPREHENSIVE METABOLIC PANEL
ALK PHOS: 55 U/L (ref 33–130)
ALT: 9 U/L (ref 6–29)
AST: 23 U/L (ref 10–35)
Albumin: 4 g/dL (ref 3.6–5.1)
BILIRUBIN TOTAL: 0.9 mg/dL (ref 0.2–1.2)
BUN: 19 mg/dL (ref 7–25)
CO2: 26 mmol/L (ref 20–31)
CREATININE: 1.07 mg/dL — AB (ref 0.60–0.88)
Calcium: 9.4 mg/dL (ref 8.6–10.4)
Chloride: 100 mmol/L (ref 98–110)
GLUCOSE: 79 mg/dL (ref 65–99)
POTASSIUM: 4.3 mmol/L (ref 3.5–5.3)
SODIUM: 139 mmol/L (ref 135–146)
TOTAL PROTEIN: 7.8 g/dL (ref 6.1–8.1)

## 2015-09-12 MED ORDER — APIXABAN 2.5 MG PO TABS: 2.5000 mg | ORAL_TABLET | Freq: Two times a day (BID) | ORAL | Status: DC

## 2015-09-12 NOTE — Assessment & Plan Note (Signed)
Her medtronic BiV PM is working normally. Will recheck in several months

## 2015-09-12 NOTE — Assessment & Plan Note (Signed)
This is a new problem. We will start Eliquis 2.5 mg twice daily. Her kidney function is borderline for 5 mg bid. Will recheck kidney function.

## 2015-09-12 NOTE — Telephone Encounter (Signed)
Pt was giving rx for Eliquis and after insurance it will be 300 a month , she got a 30 day free card but that won't help for future refills, pls advise  [phone]

## 2015-09-12 NOTE — Telephone Encounter (Signed)
Was seen by Dr. Lovena Le today and was given 30 day supply card for Eliquis. When she took card to pharmacy they told her it would be $300 a month.  She states she can't afford that medication.  She is currently taking Plavix and states she will continue to take the Plavix until Dr. Lovena Le authorizes another medications. Advised will have to forward request to Dr.Taylor for recommendations.

## 2015-09-12 NOTE — Progress Notes (Signed)
HPI Kim Mullins returns today to discuss anti-coagulation for atrial fibrillation. She is a pleasant 82 yo woman with a h/o HTN, chronic systolic heart failure, s/p BiV PPM. She has done well in the interim. She denies chest pain or sob. She was hospitalized with a stroke in October. She has been found to have atrial fibrillation on PM interogation. She presents to discuss anti-coagulation.   Current Outpatient Prescriptions  Medication Sig Dispense Refill  . Acetaminophen (TYLENOL) 325 MG CAPS Take 1 tablet by mouth daily as needed (pain).     . Ascorbic Acid (VITAMIN C PO) Take 1 tablet by mouth daily.    . Bisacodyl (LAXATIVE PO) Take by mouth as directed.    Kim Mullins CALCIUM PO Take 1 tablet by mouth daily.    . carvedilol (COREG) 3.125 MG tablet TAKE 1 TABLET BY MOUTH 2 TIMES DAILY WITH A MEAL. (Patient taking differently: TAKE 1 TABLET BY MOUTH 2 TIMES DAILY WITH A MEAL. HEART MEDICATIONS) 60 tablet 9  . diclofenac sodium (VOLTAREN) 1 % GEL Apply 1 application topically 3 (three) times daily as needed (pain, inflammation).   0  . diphenhydramine-acetaminophen (TYLENOL PM) 25-500 MG TABS tablet Take 1 tablet by mouth at bedtime as needed (sleep).     . dorzolamide (TRUSOPT) 2 % ophthalmic solution as directed.   11  . furosemide (LASIX) 40 MG tablet TAKE 1 TABLET (40 MG TOTAL) BY MOUTH DAILY. (Patient taking differently: TAKE 1 TABLET (40 MG TOTAL) BY MOUTH DAILY. FLUID PILL) 30 tablet 5  . timolol (TIMOPTIC) 0.5 % ophthalmic solution as directed.   11  . apixaban (ELIQUIS) 2.5 MG TABS tablet Take 1 tablet (2.5 mg total) by mouth 2 (two) times daily. 180 tablet 3   No current facility-administered medications for this visit.     Past Medical History  Diagnosis Date  . Bowel obstruction ([place])   . Left tibial fracture 2007  . Asthma     Allergixc reaction to cats.  . Anemia     occassionally  . Arthritis     all over.  . Diabetes mellitus 2006    Diet and exercise controlled.    Kim Mullins GERD (gastroesophageal reflux disease)     occ  . Osteoarthritis of right shoulder region 06/26/2013  . DCM (dilated cardiomyopathy) (Kim Mullins) 08/16/2014    normal coronary arteries on cath with EF 30-45%.  EF now 50% by echo 11/2014  . Chronic systolic CHF (congestive heart failure) (HCC)     EF 30-35% by cath, echo 2016 EF 50%  . Complete heart block ([facility])     a. s/p MDT CRTP pacemaker  . OSA (obstructive sleep apnea) 10/23/2014    Moderate with AHI 21/hr  . Hypertension   . Stroke ([place])   . Atrial fibrillation (Kim Mullins)     ROS:   All systems reviewed and negative except as noted in the HPI.   Past Surgical History  Procedure Laterality Date  . Abdominal hysterectomy  1969  . Dilation and curettage of uterus  1968  . Pilonidal cyst excision  1959  . Corn removal  1999    Bilateral feet  . Bunionectomy  1984    Bilateral  . Tonsillectomy  1942  . Eye surgery  1983    Surgery to fix Retainal detachment, bilateral  . Carpal tunnell  2004    Bilateral  . Total hip arthroplasty  11/16/2011    Procedure: TOTAL HIP ARTHROPLASTY ANTERIOR APPROACH;  Surgeon:  Kim Dibble, MD;  Location: [place];  Service: Orthopedics;  Laterality: Right;  DEPUY  . Total shoulder arthroplasty Right 06/26/2013    Procedure: TOTAL SHOULDER ARTHROPLASTY;  Surgeon: Kim Bridge, MD;  Location: [place];  Service: Orthopedics;  Laterality: Right;  . Cataract extraction Right     early 2015  . Joint replacement    . Esophagogastroduodenoscopy (egd) with propofol N/A 06/11/2014    Procedure: ESOPHAGOGASTRODUODENOSCOPY (EGD) WITH PROPOFOL;  Surgeon: Kim Castle, MD;  [facility]: [facility];  Service: Endoscopy;  Laterality: N/A;  . Bravo ph study N/A 06/11/2014    Procedure: BRAVO [place] STUDY;  Surgeon: Kim Castle, MD;  Location: [facility];  Service: Endoscopy;  Laterality: N/A;  Venia Minks dilation  06/11/2014    Procedure: Venia Minks DILATION;  Surgeon: Kim Castle, MD;  Location: [facility];   Service: Endoscopy;;  . Left and right heart catheterization with coronary angiogram N/A 08/29/2014    Procedure: LEFT AND RIGHT HEART CATHETERIZATION WITH CORONARY ANGIOGRAM;  Surgeon: Kim M Martinique, MD;  Location: [facility] CATH LAB;  Service: Cardiovascular;  Laterality: N/A;  . Bi-ventricular pacemaker insertion N/A 10/07/2014    MDT CRTP implanted by Dr Kim Mullins  . Cardiac catheterization      normal coronary arteries     Family History  Problem Relation Age of Onset  . Diabetes Maternal Grandfather   . Dementia Mother   . Coronary artery disease Mother   . Cancer Father     blood ? type  . Anuerysm Daughter     brain  . Colon cancer Neg Hx      Social History   Social History  . Marital Status: Widowed    Spouse Name: N/A  . Number of Children: 1  . Years of Education: N/A   Occupational History  . retired    Social History Main Topics  . Smoking status: Former Smoker -- 1.00 packs/day for 45 years    Types: Cigarettes    Quit date: 07/24/1978  . Smokeless tobacco: Never Used  . Alcohol Use: 4.2 oz/week    7 Glasses of wine per week     Comment: every day  . Drug Use: No  . Sexual Activity: Not Currently   Other Topics Concern  . Not on file   Social History Narrative     BP 110/84 mmHg  Pulse 72  Ht 5\' 3"  (1.6 m)  Wt 172 lb 3.2 oz (78.109 kg)  BMI 30.51 kg/m2  Physical Exam:  Well appearing 82 yo woman, NAD HEENT: Unremarkable Neck:  6 cm JVD, no thyromegally Back:  No CVA tenderness Lungs:  Clear with no wheezes HEART:  Regular rate rhythm, no murmurs, no rubs, no clicks Abd:  soft, positive bowel sounds, no organomegally, no rebound, no guarding Ext:  2 plus pulses, no edema, no cyanosis, no clubbing Skin:  No rashes no nodules Neuro:  CN II through XII intact, motor grossly intact   DEVICE  Normal device function.  See PaceArt for details.   Assess/Plan:

## 2015-09-12 NOTE — Patient Instructions (Addendum)
Medication Instructions:  Your physician has recommended you make the following change in your medication:  1) STOP Plavix - take last dose today Friday, 09/12/2015 2)START Eliquis 2.5 mg tablet by mouth TWICE daily - START Eliquis on Saturday,09/13/2015   Labwork: Your physician recommends that you return for lab work in: TODAY (CMET)   Testing/Procedures: none  Follow-Up: Your physician recommends that you schedule a follow-up appointment in: 3 months with Dr. Lovena Le.     Any Other Special Instructions Will Be Listed Below (If Applicable).     If you need a refill on your cardiac medications before your next appointment, please call your pharmacy.

## 2015-09-12 NOTE — Assessment & Plan Note (Signed)
Her blood pressure is well controlled. Will follow.  

## 2015-09-12 NOTE — Assessment & Plan Note (Signed)
Her symptoms are class 2. She will continue her current meds. 

## 2015-09-16 ENCOUNTER — Encounter: Payer: Self-pay | Admitting: Podiatry

## 2015-09-16 MED ORDER — DABIGATRAN ETEXILATE MESYLATE 75 MG PO CAPS: 75.0000 mg | ORAL_CAPSULE | Freq: Two times a day (BID) | ORAL | Status: DC

## 2015-09-16 NOTE — Telephone Encounter (Signed)
Discussed with Dr Lovena Le and will see if Pradaxa 75mg  twice daily is covered.  Spoke with patient and she is aware it was called into pharmacy.  She has a 30 day supply of Eliquis that she got for free.  Discussed with Fuller Canada, Pham D, okay to take the Eliquis that she has and then change over to Pradaxa once finished.  She is aware

## 2015-09-17 ENCOUNTER — Encounter: Payer: Self-pay | Admitting: Internal Medicine

## 2015-09-17 ENCOUNTER — Encounter: Payer: Self-pay | Admitting: *Deleted

## 2015-09-27 LAB — CUP PACEART INCLINIC DEVICE CHECK
Brady Statistic AP VS Percent: 0.05 %
Brady Statistic AS VP Percent: 73.31 %
Brady Statistic AS VS Percent: 1.2 %
Brady Statistic RA Percent Paced: 25.49 %
Brady Statistic RV Percent Paced: 98.75 %
Date Time Interrogation Session: 20170113151557
Implantable Lead Implant Date: 20160208
Implantable Lead Location: 753859
Lead Channel Impedance Value: 1026 Ohm
Lead Channel Impedance Value: 456 Ohm
Lead Channel Impedance Value: 494 Ohm
Lead Channel Impedance Value: 513 Ohm
Lead Channel Impedance Value: 513 Ohm
Lead Channel Impedance Value: 741 Ohm
Lead Channel Pacing Threshold Amplitude: 0.75 V
Lead Channel Pacing Threshold Amplitude: 1.5 V
Lead Channel Pacing Threshold Pulse Width: 0.4 ms
Lead Channel Pacing Threshold Pulse Width: 0.8 ms
Lead Channel Setting Pacing Amplitude: 2 V
Lead Channel Setting Pacing Amplitude: 2.5 V
Lead Channel Setting Pacing Pulse Width: 0.4 ms
Lead Channel Setting Pacing Pulse Width: 0.8 ms
Lead Channel Setting Sensing Sensitivity: 2.8 mV
MDC IDC LEAD IMPLANT DT: 20160208
MDC IDC LEAD LOCATION: 753859
MDC IDC MSMT BATTERY REMAINING LONGEVITY: 50 mo
MDC IDC MSMT BATTERY VOLTAGE: 2.99 V
MDC IDC MSMT LEADCHNL LV IMPEDANCE VALUE: 437 Ohm
MDC IDC MSMT LEADCHNL LV IMPEDANCE VALUE: 817 Ohm
MDC IDC MSMT LEADCHNL RA IMPEDANCE VALUE: 361 Ohm
MDC IDC MSMT LEADCHNL RA SENSING INTR AMPL: 2.875 mV
MDC IDC MSMT LEADCHNL RV PACING THRESHOLD AMPLITUDE: 0.75 V
MDC IDC MSMT LEADCHNL RV PACING THRESHOLD PULSEWIDTH: 0.4 ms
MDC IDC MSMT LEADCHNL RV SENSING INTR AMPL: 6.125 mV
MDC IDC SET LEADCHNL LV PACING AMPLITUDE: 2.5 V
MDC IDC STAT BRADY AP VP PERCENT: 25.44 %

## 2015-09-29 ENCOUNTER — Other Ambulatory Visit: Payer: Self-pay

## 2015-09-29 NOTE — Patient Outreach (Signed)
Telephone call from patient to obtain mRS. Score = 0.

## 2015-10-01 ENCOUNTER — Encounter: Payer: Self-pay | Admitting: Podiatry

## 2015-10-01 ENCOUNTER — Ambulatory Visit (INDEPENDENT_AMBULATORY_CARE_PROVIDER_SITE_OTHER): Payer: Medicare Other | Admitting: Podiatry

## 2015-10-01 VITALS — BP 131/98 | HR 69 | Resp 12

## 2015-10-01 DIAGNOSIS — S90812D Abrasion, left foot, subsequent encounter: Secondary | ICD-10-CM

## 2015-10-01 NOTE — Patient Instructions (Signed)
Today the abrasion on the back the left heel has healed, however, the skin is tender. Discontinue all topical antibiotic ointment Apply Vaseline and a Band-Aid daily to the back of the left heel until the skin looks normal and feels normal Okay to resume water aerobics

## 2015-10-02 NOTE — Progress Notes (Signed)
Patient ID: Kim Mullins, female   DOB: 07-28-1930, 82 y.o.   MRN: [id number]  Subjective: This patient presents for follow-up care for abrasion on the posterior lateral left heel initially evaluated on 09/10/2015. At that time patient was advised to apply topical antibiotic ointment and Band-Aid and cover daily. Patient has complied with these instructions  Objective: Orientated 3   Vascular: PT pulses trace palpable bilaterally DP pulses 2/4 bilaterally Capillary reflex immediate bilaterally  Neurological: Deferred  Dermatological: The posterior lateral left where the abrasion was previously noted as closed with a pink new skin appearance in that area. There is no surrounding erythema, edema, warmth  Assessment: Resolved abrasion posterior left heel  Plan: Patient advised to DC topical antibiotic ointment Advised to apply Vaseline and Band-Aid to the posterior left heel daily until the skin resumes a normal appearance Okay to resume swimming  Reappoint at patient's request

## 2015-10-09 ENCOUNTER — Encounter (HOSPITAL_COMMUNITY): Payer: Self-pay | Admitting: Emergency Medicine

## 2015-10-09 ENCOUNTER — Emergency Department (HOSPITAL_COMMUNITY)
Admission: EM | Admit: 2015-10-09 | Discharge: 2015-10-09 | Disposition: A | Discharge status: Home / Self Care | Payer: Medicare Other | Source: Home / Self Care | Attending: Family Medicine | Admitting: Family Medicine

## 2015-10-09 DIAGNOSIS — M549 Dorsalgia, unspecified: Secondary | ICD-10-CM | POA: Diagnosis not present

## 2015-10-09 NOTE — ED Notes (Addendum)
C/o back pain onset yest after yoga class... Denies inj/trauma Walker present upon arrival A&O x4... No acute distress.

## 2015-10-09 NOTE — Discharge Instructions (Signed)
It was a pleasure to see you today.  I believe your back pain is related to muscular spasm.   I recommend taking Tylenol on a regularly scheduled basis for the next 2 to 4 days; 500 to 650mg  by mouth every 6 to 8 hours.   Use heating pad or hot shower to apply heat to your back periodically.   Call your primary provider if you are getting worse.   Return to Urgent Care or ED if you experience worsening pain, falls, fevers or chills, or other worsening.

## 2015-10-09 NOTE — ED Provider Notes (Signed)
CSN: [id number]     Arrival date & time 10/09/15  1301 History   First MD Initiated Contact with Patient 10/09/15 1312     Chief Complaint  Patient presents with  . Back Pain   (Consider location/radiation/quality/duration/timing/severity/associated sxs/prior Treatment) Patient is a 82 y.o. female presenting with back pain. The history is provided by the patient. No language interpreter was used.  Back Pain Associated symptoms: no chest pain and no fever   Patient presents with complaint of back pain extending over the entirety of her back (nuchal area to lumbosacral) starting yesterday @2pm  after yoga class. Became worse over the afternoon, initially concentrated over L buttock and then generalized. Was a 10 on 10-point scale yesterday.  Took 2 tylenol tabs at 7pm and improved. No meds today. This morning the pain was generalized, milder. At the present time she reports having a pain score of zero (no pain) but anticipates will come back if she gets up to move. Is using rolling walker to ambulate, which is her baseline when out of the house.   ROS : Denies fevers or chills, no weakness or falls, no chest pain or dyspnea, no radiation of pain to legs/arms.   PMHx; CHF, on Eliquis, medications reviewed.  Allergy to PCN reported. Other med allergies reviewed on Epic chart.   Past Medical History  Diagnosis Date  . Bowel obstruction ([place])   . Left tibial fracture 2007  . Asthma     Allergixc reaction to cats.  . Anemia     occassionally  . Arthritis     all over.  . Diabetes mellitus 2006    Diet and exercise controlled.  Marland Kitchen GERD (gastroesophageal reflux disease)     occ  . Osteoarthritis of right shoulder region 06/26/2013  . DCM (dilated cardiomyopathy) ([place]) 08/16/2014    normal coronary arteries on cath with EF 30-45%.  EF now 50% by echo 11/2014  . Chronic systolic CHF (congestive heart failure) (HCC)     EF 30-35% by cath, echo 2016 EF 50%  . Complete heart block (HCC)     a. s/p  MDT CRTP pacemaker  . OSA (obstructive sleep apnea) 10/23/2014    Moderate with AHI 21/hr  . Hypertension   . Stroke ([place])   . Atrial fibrillation [facility])    Past Surgical History  Procedure Laterality Date  . Abdominal hysterectomy  1969  . Dilation and curettage of uterus  1968  . Pilonidal cyst excision  1959  . Corn removal  1999    Bilateral feet  . Bunionectomy  1984    Bilateral  . Tonsillectomy  1942  . Eye surgery  1983    Surgery to fix Retainal detachment, bilateral  . Carpal tunnell  2004    Bilateral  . Total hip arthroplasty  11/16/2011    Procedure: TOTAL HIP ARTHROPLASTY ANTERIOR APPROACH;  Surgeon: Hessie Dibble, MD;  Location: [place];  Service: Orthopedics;  Laterality: Right;  DEPUY  . Total shoulder arthroplasty Right 06/26/2013    Procedure: TOTAL SHOULDER ARTHROPLASTY;  Surgeon: Johnny Bridge, MD;  Location: [place];  Service: Orthopedics;  Laterality: Right;  . Cataract extraction Right     early 2015  . Joint replacement    . Esophagogastroduodenoscopy (egd) with propofol N/A 06/11/2014    Procedure: ESOPHAGOGASTRODUODENOSCOPY (EGD) WITH PROPOFOL;  Surgeon: Inda Castle, MD;  [facility];  Service: Endoscopy;  Laterality: N/A;  . Bravo ph study N/A 06/11/2014    Procedure:  BRAVO [place] STUDY;  Surgeon: Inda Castle, MD;  Location: [facility];  Service: Endoscopy;  Laterality: N/A;  Venia Minks dilation  06/11/2014    Procedure: Venia Minks DILATION;  Surgeon: Inda Castle, MD;  [facility];  Service: Endoscopy;;  . Left and right heart catheterization with coronary angiogram N/A 08/29/2014    Procedure: LEFT AND RIGHT HEART CATHETERIZATION WITH CORONARY ANGIOGRAM;  Surgeon: Peter M Martinique, MD;  Location[facility] CATH LAB;  Service: Cardiovascular;  Laterality: N/A;  . Bi-ventricular pacemaker insertion N/A 10/07/2014    MDT CRTP implanted by Dr Lovena Le  . Cardiac catheterization      normal coronary arteries   Family History  Problem  Relation Age of Onset  . Diabetes Maternal Grandfather   . Dementia Mother   . Coronary artery disease Mother   . Cancer Father     blood ? type  . Anuerysm Daughter     brain  . Colon cancer Neg Hx    Social History  Substance Use Topics  . Smoking status: Former Smoker -- 1.00 packs/day for 45 years    Types: Cigarettes    Quit date: 07/24/1978  . Smokeless tobacco: Never Used  . Alcohol Use: 4.2 oz/week    7 Glasses of wine per week     Comment: every day   OB History    No data available     Review of Systems  Constitutional: Negative for fever, chills, appetite change and fatigue.  HENT: Negative for congestion.   Respiratory: Negative for cough and chest tightness.   Cardiovascular: Negative for chest pain, palpitations and leg swelling.  Musculoskeletal: Positive for back pain.  All other systems reviewed and are negative.   Allergies  Hydrocodone; Percocet; Mercurial derivatives; Eggs or egg-derived products; Penicillins; and Quinine derivatives  Home Medications   Prior to Admission medications   Medication Sig Start Date End Date Taking? Authorizing Provider  Acetaminophen (TYLENOL) 325 MG CAPS Take 1 tablet by mouth daily as needed (pain).    Yes Historical Provider, MD  CALCIUM PO Take 1 tablet by mouth daily.   Yes Historical Provider, MD  carvedilol (COREG) 3.125 MG tablet TAKE 1 TABLET BY MOUTH 2 TIMES DAILY WITH A MEAL. Patient taking differently: TAKE 1 TABLET BY MOUTH 2 TIMES DAILY WITH A MEAL. HEART MEDICATIONS 05/13/15  Yes Sueanne Margarita, MD  dorzolamide (TRUSOPT) 2 % ophthalmic solution as directed.  07/30/15  Yes Historical Provider, MD  furosemide (LASIX) 40 MG tablet TAKE 1 TABLET (40 MG TOTAL) BY MOUTH DAILY. Patient taking differently: TAKE 1 TABLET (40 MG TOTAL) BY MOUTH DAILY. FLUID PILL 04/07/15  Yes Sueanne Margarita, MD  timolol (TIMOPTIC) 0.5 % ophthalmic solution as directed.  07/29/15  Yes Historical Provider, MD  apixaban (ELIQUIS) 2.5 MG  TABS tablet Take 2.5 mg by mouth 2 (two) times daily.    Historical Provider, MD  Ascorbic Acid (VITAMIN C PO) Take 1 tablet by mouth daily.    Historical Provider, MD  Bisacodyl (LAXATIVE PO) Take by mouth as directed.    Historical Provider, MD  diclofenac sodium (VOLTAREN) 1 % GEL Apply 1 application topically 3 (three) times daily as needed (pain, inflammation).  04/23/15   Historical Provider, MD  diphenhydramine-acetaminophen (TYLENOL PM) 25-500 MG TABS tablet Take 1 tablet by mouth at bedtime as needed (sleep).     Historical Provider, MD   Meds Ordered and Administered this Visit  Medications - No data to display  BP 116/75  mmHg  Pulse 84  Temp(Src) 98.8 F (37.1 C) (Oral)  Resp 16  SpO2 98% No data found.   Physical Exam  Constitutional: She is oriented to person, place, and time. She appears well-developed and well-nourished. No distress.  Alert, excellent historian. Using rolling walker from home.    HENT:  Head: Normocephalic and atraumatic.  Mouth/Throat: Oropharynx is clear and moist. No oropharyngeal exudate.  Eyes: Conjunctivae and EOM are normal. Pupils are equal, round, and reactive to light. Right eye exhibits no discharge. Left eye exhibits no discharge.  Neck: Normal range of motion. Neck supple. No tracheal deviation present. No thyromegaly present.  Able to move neck in all planes; no point tenderness over cervical spine, t-spine or LS spine.   Cardiovascular: Normal rate and regular rhythm.   Pulmonary/Chest: Effort normal and breath sounds normal. No respiratory distress. She has no wheezes. She has no rales. She exhibits no tenderness.  Abdominal: Soft.  Musculoskeletal:  Full active ROM LEs and UEs. Able to get up from seated position without assistance.   Lymphadenopathy:    She has no cervical adenopathy.  Neurological: She is alert and oriented to person, place, and time. She has normal reflexes. Coordination normal.  Patient sensation in UEs and LEs  intact and symmetric. Radial and DP pulses intact bilaterally. Brisk cap refill in toes and fingers.  Shoulder shrug intact and symmetric.  Handgrip full and symmetric, intact.  Seated SLR intact without limitation.     Skin: Skin is warm and dry. She is not diaphoretic.  Psychiatric: She has a normal mood and affect. Her behavior is normal.    ED Course  Procedures (including critical care time)  Labs Review Labs Reviewed - No data to display  Imaging Review No results found.   Visual Acuity Review  Right Eye Distance:   Left Eye Distance:   Bilateral Distance:    Right Eye Near:   Left Eye Near:    Bilateral Near:         MDM  No diagnosis found. Patient with complaint of back pain, yesterday without any point tenderness over vertebral processes. No worrisome associated sxs. No pain at this time.  Plan for scheduled Tylenol, warm compresses, regular followup with PCP.   Discussed plan with her.   Dalbert Mayotte, MD    Willeen Niece, MD 10/09/15 [phone]

## 2015-10-17 DIAGNOSIS — Y9342 Activity, yoga: Secondary | ICD-10-CM

## 2015-10-17 NOTE — Congregational Nurse Program (Signed)
Congregational Nurse Program Note  Date of Encounter: 10/17/2015  Past Medical History: No past medical history on file.  Encounter Details:     CNP Questionnaire - 10/17/15 1053    Patient Demographics   Is this a new or existing patient? New   Patient is considered a/an Not Applicable   Race African-American/Black   Patient Assistance   Location of Patient Assistance Not Applicable   Patient's financial/insurance status Medicare   Uninsured Patient No   Patient referred to apply for the following financial assistance Not Applicable   Food insecurities addressed Not Applicable   Transportation assistance No   Assistance securing medications No   Educational health offerings Exercise/physical activity   Encounter Details   Primary purpose of visit Spiritual Care/Support Visit   Was an Emergency Department visit averted? Not Applicable   Does patient have a medical provider? Yes   Patient referred to Not Applicable   Was a mental health screening completed? (GAINS tool) No   Does patient have dental issues? No   Does patient have vision issues? No   Since previous encounter, have you referred patient for abnormal blood pressure that resulted in a new diagnosis or medication change? No   Since previous encounter, have you referred patient for abnormal blood glucose that resulted in a new diagnosis or medication change? No   For Abstraction Use Only   Does patient have insurance? Yes       Attended chair yoga class.

## 2015-10-20 ENCOUNTER — Other Ambulatory Visit: Payer: Self-pay | Admitting: Cardiology

## 2015-11-05 NOTE — Congregational Nurse Program (Signed)
Congregational Nurse Program Note  Date of Encounter: 10/17/2015  Past Medical History: No past medical history on file.  Encounter Details:     CNP Questionnaire - 11/05/15 2150    Patient Demographics   Is this a new or existing patient? Existing   Patient is considered a/an Not Applicable   Race African-American/Black   Patient Assistance   Location of Patient Assistance Not Applicable   Patient's financial/insurance status Medicare   Uninsured Patient No   Patient referred to apply for the following financial assistance Not Applicable   Food insecurities addressed Not Applicable   Transportation assistance No   Assistance securing medications No   Educational health offerings Exercise/physical activity   Encounter Details   Primary purpose of visit Spiritual Care/Support Visit   Was an Emergency Department visit averted? Not Applicable   Does patient have a medical provider? Yes   Patient referred to Not Applicable   Was a mental health screening completed? (GAINS tool) No   Does patient have dental issues? No   Does patient have vision issues? No   Since previous encounter, have you referred patient for abnormal blood pressure that resulted in a new diagnosis or medication change? No   Since previous encounter, have you referred patient for abnormal blood glucose that resulted in a new diagnosis or medication change? No   For Abstraction Use Only   Does patient have insurance? Yes       Attended Chair Yoga Class.

## 2015-11-23 ENCOUNTER — Encounter (HOSPITAL_COMMUNITY): Payer: Self-pay

## 2015-11-23 ENCOUNTER — Emergency Department (HOSPITAL_COMMUNITY): Payer: Medicare Other

## 2015-11-23 ENCOUNTER — Emergency Department (HOSPITAL_COMMUNITY)
Admission: EM | Admit: 2015-11-23 | Discharge: 2015-11-24 | Disposition: A | Discharge status: Home / Self Care | Payer: Medicare Other | Attending: Emergency Medicine | Admitting: Emergency Medicine

## 2015-11-23 DIAGNOSIS — I4891 Unspecified atrial fibrillation: Secondary | ICD-10-CM | POA: Diagnosis not present

## 2015-11-23 DIAGNOSIS — Z950 Presence of cardiac pacemaker: Secondary | ICD-10-CM | POA: Diagnosis not present

## 2015-11-23 DIAGNOSIS — N390 Urinary tract infection, site not specified: Secondary | ICD-10-CM | POA: Diagnosis not present

## 2015-11-23 DIAGNOSIS — Y92009 Unspecified place in unspecified non-institutional (private) residence as the place of occurrence of the external cause: Secondary | ICD-10-CM | POA: Insufficient documentation

## 2015-11-23 DIAGNOSIS — I5022 Chronic systolic (congestive) heart failure: Secondary | ICD-10-CM | POA: Diagnosis not present

## 2015-11-23 DIAGNOSIS — W01198A Fall on same level from slipping, tripping and stumbling with subsequent striking against other object, initial encounter: Secondary | ICD-10-CM | POA: Diagnosis not present

## 2015-11-23 DIAGNOSIS — Y9389 Activity, other specified: Secondary | ICD-10-CM | POA: Insufficient documentation

## 2015-11-23 DIAGNOSIS — Z87891 Personal history of nicotine dependence: Secondary | ICD-10-CM | POA: Diagnosis not present

## 2015-11-23 DIAGNOSIS — Z8719 Personal history of other diseases of the digestive system: Secondary | ICD-10-CM | POA: Insufficient documentation

## 2015-11-23 DIAGNOSIS — Z8669 Personal history of other diseases of the nervous system and sense organs: Secondary | ICD-10-CM | POA: Insufficient documentation

## 2015-11-23 DIAGNOSIS — S0001XA Abrasion of scalp, initial encounter: Secondary | ICD-10-CM | POA: Diagnosis not present

## 2015-11-23 DIAGNOSIS — Z862 Personal history of diseases of the blood and blood-forming organs and certain disorders involving the immune mechanism: Secondary | ICD-10-CM | POA: Insufficient documentation

## 2015-11-23 DIAGNOSIS — Z8673 Personal history of transient ischemic attack (TIA), and cerebral infarction without residual deficits: Secondary | ICD-10-CM | POA: Diagnosis not present

## 2015-11-23 DIAGNOSIS — S0990XA Unspecified injury of head, initial encounter: Secondary | ICD-10-CM | POA: Diagnosis present

## 2015-11-23 DIAGNOSIS — E119 Type 2 diabetes mellitus without complications: Secondary | ICD-10-CM | POA: Insufficient documentation

## 2015-11-23 DIAGNOSIS — Z880 Allergy status to penicillin: Secondary | ICD-10-CM | POA: Insufficient documentation

## 2015-11-23 DIAGNOSIS — M1990 Unspecified osteoarthritis, unspecified site: Secondary | ICD-10-CM | POA: Diagnosis not present

## 2015-11-23 DIAGNOSIS — Z7901 Long term (current) use of anticoagulants: Secondary | ICD-10-CM | POA: Diagnosis not present

## 2015-11-23 DIAGNOSIS — I10 Essential (primary) hypertension: Secondary | ICD-10-CM | POA: Diagnosis not present

## 2015-11-23 DIAGNOSIS — Z79899 Other long term (current) drug therapy: Secondary | ICD-10-CM | POA: Diagnosis not present

## 2015-11-23 DIAGNOSIS — J45909 Unspecified asthma, uncomplicated: Secondary | ICD-10-CM | POA: Diagnosis not present

## 2015-11-23 DIAGNOSIS — Y998 Other external cause status: Secondary | ICD-10-CM | POA: Insufficient documentation

## 2015-11-23 DIAGNOSIS — W19XXXA Unspecified fall, initial encounter: Secondary | ICD-10-CM

## 2015-11-23 DIAGNOSIS — Z9889 Other specified postprocedural states: Secondary | ICD-10-CM | POA: Insufficient documentation

## 2015-11-23 DIAGNOSIS — Z8781 Personal history of (healed) traumatic fracture: Secondary | ICD-10-CM | POA: Insufficient documentation

## 2015-11-23 LAB — URINALYSIS, ROUTINE W REFLEX MICROSCOPIC
Bilirubin Urine: NEGATIVE
Glucose, UA: NEGATIVE mg/dL
Hgb urine dipstick: NEGATIVE
Ketones, ur: NEGATIVE mg/dL
NITRITE: NEGATIVE
PROTEIN: NEGATIVE mg/dL
Specific Gravity, Urine: 1.013 (ref 1.005–1.030)
pH: 6 (ref 5.0–8.0)

## 2015-11-23 LAB — URINE MICROSCOPIC-ADD ON

## 2015-11-23 LAB — CBC
HCT: 36.9 % (ref 36.0–46.0)
Hemoglobin: 11.3 g/dL — ABNORMAL LOW (ref 12.0–15.0)
MCH: 27 pg (ref 26.0–34.0)
MCHC: 30.6 g/dL (ref 30.0–36.0)
MCV: 88.1 fL (ref 78.0–100.0)
PLATELETS: 242 10*3/uL (ref 150–400)
RBC: 4.19 MIL/uL (ref 3.87–5.11)
RDW: 15.8 % — ABNORMAL HIGH (ref 11.5–15.5)
WBC: 3.8 10*3/uL — AB (ref 4.0–10.5)

## 2015-11-23 LAB — BASIC METABOLIC PANEL
Anion gap: 11 (ref 5–15)
BUN: 19 mg/dL (ref 6–20)
CALCIUM: 9.1 mg/dL (ref 8.9–10.3)
CHLORIDE: 106 mmol/L (ref 101–111)
CO2: 24 mmol/L (ref 22–32)
CREATININE: 1.15 mg/dL — AB (ref 0.44–1.00)
GFR, EST AFRICAN AMERICAN: 50 mL/min — AB (ref >60)
GFR, EST NON AFRICAN AMERICAN: 43 mL/min — AB (ref >60)
Glucose, Bld: 120 mg/dL — ABNORMAL HIGH (ref 65–99)
Potassium: 4.1 mmol/L (ref 3.5–5.1)
SODIUM: 141 mmol/L (ref 135–145)

## 2015-11-23 NOTE — ED Notes (Signed)
Patient transported to CT 

## 2015-11-23 NOTE — ED Provider Notes (Signed)
CSN: [id number]     Arrival date & time 11/23/15  2100 History  By signing my name below, I, Altamease Oiler, attest that this documentation has been prepared under the direction and in the presence of Everlene Balls, MD. Electronically Signed: Altamease Oiler, ED Scribe. 11/23/2015. 11:34 PM   Chief Complaint  Patient presents with  . Fall  . Head Laceration    The history is provided by the patient. No language interpreter was used.   Kim Mullins is a 82 y.o. female on Eliquis with history of stroke, a-fib, CHF, DM, and HTN who presents to the Emergency Department complaining of a fall tonight at home. Pt states that she stood from sitting and fell backwards striking her head on a file cabinet. She did not feel dizzy before falling.  Associated symptoms include a laceration at the back of the head. Pt denies LOC and other injury.   Past Medical History  Diagnosis Date  . Bowel obstruction ([place])   . Left tibial fracture 2007  . Asthma     Allergixc reaction to cats.  . Anemia     occassionally  . Arthritis     all over.  . Diabetes mellitus 2006    Diet and exercise controlled.  Marland Kitchen GERD (gastroesophageal reflux disease)     occ  . Osteoarthritis of right shoulder region 06/26/2013  . DCM (dilated cardiomyopathy) ([place]) 08/16/2014    normal coronary arteries on cath with EF 30-45%.  EF now 50% by echo 11/2014  . Chronic systolic CHF (congestive heart failure) (HCC)     EF 30-35% by cath, echo 2016 EF 50%  . Complete heart block (HCC)     a. s/p MDT CRTP pacemaker  . OSA (obstructive sleep apnea) 10/23/2014    Moderate with AHI 21/hr  . Hypertension   . Stroke ([place])   . Atrial fibrillation [facility])    Past Surgical History  Procedure Laterality Date  . Abdominal hysterectomy  1969  . Dilation and curettage of uterus  1968  . Pilonidal cyst excision  1959  . Corn removal  1999    Bilateral feet  . Bunionectomy  1984    Bilateral  . Tonsillectomy  1942  . Eye surgery   1983    Surgery to fix Retainal detachment, bilateral  . Carpal tunnell  2004    Bilateral  . Total hip arthroplasty  11/16/2011    Procedure: TOTAL HIP ARTHROPLASTY ANTERIOR APPROACH;  Surgeon: Hessie Dibble, MD;  Location: [place];  Service: Orthopedics;  Laterality: Right;  DEPUY  . Total shoulder arthroplasty Right 06/26/2013    Procedure: TOTAL SHOULDER ARTHROPLASTY;  Surgeon: Johnny Bridge, MD;  Location: [place];  Service: Orthopedics;  Laterality: Right;  . Cataract extraction Right     early 2015  . Joint replacement    . Esophagogastroduodenoscopy (egd) with propofol N/A 06/11/2014    Procedure: ESOPHAGOGASTRODUODENOSCOPY (EGD) WITH PROPOFOL;  Surgeon: Inda Castle, MD;  Location: [facility];  Service: Endoscopy;  Laterality: N/A;  . Bravo ph study N/A 06/11/2014    Procedure: BRAVO [place] STUDY;  Surgeon: Inda Castle, MD;  Location: [facility];  Service: Endoscopy;  Laterality: N/A;  Venia Minks dilation  06/11/2014    Procedure: Venia Minks DILATION;  Surgeon: Inda Castle, MD;  Location: [facility];  Service: Endoscopy;;  . Left and right heart catheterization with coronary angiogram N/A 08/29/2014    Procedure: LEFT AND RIGHT HEART CATHETERIZATION WITH CORONARY ANGIOGRAM;  Surgeon: Peter M Martinique, MD;  Location: [facility] C[facility] LAB;  Service: Cardiovascular;  Laterality: N/A;  . Bi-ventricular pacemaker insertion N/A 10/07/2014    MDT CRTP implanted by Dr Lovena Le  . Cardiac catheterization      normal coronary arteries   Family History  Problem Relation Age of Onset  . Diabetes Maternal Grandfather   . Dementia Mother   . Coronary artery disease Mother   . Cancer Father     blood ? type  . Anuerysm Daughter     brain  . Colon cancer Neg Hx    Social History  Substance Use Topics  . Smoking status: Former Smoker -- 1.00 packs/day for 45 years    Types: Cigarettes    Quit date: 07/24/1978  . Smokeless tobacco: Never Used  . Alcohol Use: 4.2 oz/week    7 Glasses of  wine per week     Comment: every day   OB History    No data available     Review of Systems  10 Systems reviewed and all are negative for acute change except as noted in the HPI.   Allergies  Hydrocodone; Percocet; Mercurial derivatives; Eggs or egg-derived products; Penicillins; and Quinine derivatives  Home Medications   Prior to Admission medications   Medication Sig Start Date End Date Taking? Authorizing Provider  Acetaminophen (TYLENOL) 325 MG CAPS Take 1 tablet by mouth daily as needed (pain).     Historical Provider, MD  apixaban (ELIQUIS) 2.5 MG TABS tablet Take 2.5 mg by mouth 2 (two) times daily.    Historical Provider, MD  Ascorbic Acid (VITAMIN C PO) Take 1 tablet by mouth daily.    Historical Provider, MD  Bisacodyl (LAXATIVE PO) Take by mouth as directed.    Historical Provider, MD  CALCIUM PO Take 1 tablet by mouth daily.    Historical Provider, MD  carvedilol (COREG) 3.125 MG tablet TAKE 1 TABLET BY MOUTH 2 TIMES DAILY WITH A MEAL. Patient taking differently: TAKE 1 TABLET BY MOUTH 2 TIMES DAILY WITH A MEAL. HEART MEDICATIONS 05/13/15   Sueanne Margarita, MD  diclofenac sodium (VOLTAREN) 1 % GEL Apply 1 application topically 3 (three) times daily as needed (pain, inflammation).  04/23/15   Historical Provider, MD  diphenhydramine-acetaminophen (TYLENOL PM) 25-500 MG TABS tablet Take 1 tablet by mouth at bedtime as needed (sleep).     Historical Provider, MD  dorzolamide (TRUSOPT) 2 % ophthalmic solution as directed.  07/30/15   Historical Provider, MD  furosemide (LASIX) 40 MG tablet TAKE 1 TABLET BY MOUTH EVERY DAY 10/20/15   Sueanne Margarita, MD  timolol (TIMOPTIC) 0.5 % ophthalmic solution as directed.  07/29/15   Historical Provider, MD   BP 133/100 mmHg  Pulse 71  Temp(Src) 97.6 F (36.4 C) (Oral)  Resp 16  SpO2 97% Physical Exam  Constitutional: She is oriented to person, place, and time. She appears well-developed and well-nourished. No distress.  HENT:   Head: Normocephalic.  Nose: Nose normal.  Mouth/Throat: Oropharynx is clear and moist. No oropharyngeal exudate.  Small 1cm abrasion to posterior head.  Eyes: Conjunctivae and EOM are normal. Pupils are equal, round, and reactive to light. No scleral icterus.  Neck: Normal range of motion. Neck supple. No JVD present. No tracheal deviation present. No thyromegaly present.  Cardiovascular: Normal rate, regular rhythm and normal heart sounds.  Exam reveals no gallop and no friction rub.   No murmur heard. Pulmonary/Chest: Effort normal and breath sounds normal. No respiratory  distress. She has no wheezes. She exhibits no tenderness.  Abdominal: Soft. Bowel sounds are normal. She exhibits no distension and no mass. There is no tenderness. There is no rebound and no guarding.  Musculoskeletal: Normal range of motion. She exhibits no edema or tenderness.  Lymphadenopathy:    She has no cervical adenopathy.  Neurological: She is alert and oriented to person, place, and time. No cranial nerve deficit. She exhibits normal muscle tone.  Normal strength and sensation in all extremities, normal cerebellar testing.  Skin: Skin is warm and dry. No rash noted. No erythema. No pallor.  Nursing note and vitals reviewed.   ED Course  Procedures (including critical care time) DIAGNOSTIC STUDIES: Oxygen Saturation is 97% on RA,  normal by my interpretation.    COORDINATION OF CARE: 11:21 PM Discussed treatment plan which includes lab work, EKG, and CT head without contrast with pt at bedside and pt agreed to plan.  Labs Review Labs Reviewed  BASIC METABOLIC PANEL - Abnormal; Notable for the following:    Glucose, Bld 120 (*)    Creatinine, Ser 1.15 (*)    GFR calc non Af Amer 43 (*)    GFR calc Af Amer 50 (*)    All other components within normal limits  CBC - Abnormal; Notable for the following:    WBC 3.8 (*)    Hemoglobin 11.3 (*)    RDW 15.8 (*)    All other components within normal limits   URINALYSIS, ROUTINE W REFLEX MICROSCOPIC (NOT AT [facility]) - Abnormal; Notable for the following:    Color, Urine AMBER (*)    Leukocytes, UA MODERATE (*)    All other components within normal limits  URINE MICROSCOPIC-ADD ON - Abnormal; Notable for the following:    Squamous Epithelial / LPF 0-5 (*)    Bacteria, UA RARE (*)    All other components within normal limits    Imaging Review Ct Head Wo Contrast  11/23/2015  CLINICAL DATA:  Status post fall backwards, hitting head on file cabinet. Scalp hematoma and laceration. Initial encounter. EXAM: CT HEAD WITHOUT CONTRAST TECHNIQUE: Contiguous axial images were obtained from the base of the skull through the vertex without intravenous contrast. COMPARISON:  CT of the head performed 06/20/2015 FINDINGS: There is no evidence of acute infarction, mass lesion, or intra- or extra-axial hemorrhage on CT. Prominence of the ventricles sulci reflects mild cortical volume loss. Mild cerebellar atrophy is noted. Mild periventricular and subcortical white matter change likely reflects small vessel ischemic microangiopathy. The brainstem and fourth ventricle are within normal limits. The basal ganglia are unremarkable in appearance. The cerebral hemispheres demonstrate grossly normal gray-white differentiation. No mass effect or midline shift is seen. There is no evidence of fracture; visualized osseous structures are unremarkable in appearance. The visualized portions of the orbits are within normal limits. The paranasal sinuses and mastoid air cells are well-aerated. A soft tissue laceration is noted at the left posterior vertex, with associated soft tissue swelling. IMPRESSION: 1. No evidence of traumatic intracranial injury or fracture. 2. Soft tissue laceration at the left posterior vertex, with associated soft tissue swelling. 3. Mild cortical volume loss and scattered small vessel ischemic microangiopathy. Electronically Signed   By: Garald Balding M.D.   On:  11/23/2015 23:39   I have personally reviewed and evaluated these images and lab results as part of my medical decision-making.   EKG Interpretation   Date/Time:  Sunday November 23 2015 21:07:34 EDT Ventricular Rate:  69  PR Interval:  132 QRS Duration: 132 QT Interval:  478 QTC Calculation: 519 R Axis:   147 Text Interpretation:  Atrial-sensed ventricular-paced rhythm Abnormal ECG  No significant change since last tracing Confirmed by Glynn Octave [phone]) on 11/23/2015 11:27:00 PM      MDM   Final diagnoses:  None    Patient presents to the ED after a fall.  Patient denies tripping over anything, thus will obtain syncopal workup.  CT head ordered and tetanus shot updated.  EKG shows no causes of syncope.   CT scan is negative for intercranial bleed. Tetanus shot was updated. Urinalysis reveals mild infection, possibly because the patient's fall. EKG does not show any signs of arrhythmia or cause of syncope. Patient be treated for ear infection. Neurological exam remains normal. Vital signs were within normal limits and she is safe for discharge.   I personally performed the services described in this documentation, which was scribed in my presence. The recorded information has been reviewed and is accurate.     Everlene Balls, MD 11/24/15 [phone]

## 2015-11-23 NOTE — ED Notes (Signed)
Pt reports onset 8:30p pt was standing, getting ready to turn around and fell backwards hitting back of head on file cabinet.  Quarter size hematoma on left side of posterior head, oozing blood.

## 2015-11-24 MED ORDER — SULFAMETHOXAZOLE-TRIMETHOPRIM 800-160 MG PO TABS: 1.0000 | ORAL_TABLET | Freq: Two times a day (BID) | ORAL | Status: AC

## 2015-11-24 MED ORDER — SULFAMETHOXAZOLE-TRIMETHOPRIM 800-160 MG PO TABS
1.0000 | ORAL_TABLET | Freq: Once | ORAL | Status: AC
  Administered 2015-11-24: 1 via ORAL
  Filled 2015-11-24: qty 1

## 2015-11-24 NOTE — Discharge Instructions (Signed)
Fall Prevention in the Home  [name], your CT scan did not show any injury to your brain.  Your urine shows a mild infection.  Take antibiotics for 3 days for treatment. See your primary care doctor within 3 days for close follow up. If symptoms worsen, come back to the ED immediately. Thank you. Falls can cause injuries. They can happen to people of all ages. There are many things you can do to make your home safe and to help prevent falls.  WHAT CAN I DO ON THE OUTSIDE OF MY HOME?  Regularly fix the edges of walkways and driveways and fix any cracks.  Remove anything that might make you trip as you walk through a door, such as a raised step or threshold.  Trim any bushes or trees on the path to your home.  Use bright outdoor lighting.  Clear any walking paths of anything that might make someone trip, such as rocks or tools.  Regularly check to see if handrails are loose or broken. Make sure that both sides of any steps have handrails.  Any raised decks and porches should have guardrails on the edges.  Have any leaves, snow, or ice cleared regularly.  Use sand or salt on walking paths during winter.  Clean up any spills in your garage right away. This includes oil or grease spills. WHAT CAN I DO IN THE BATHROOM?   Use night lights.  Install grab bars by the toilet and in the tub and shower. Do not use towel bars as grab bars.  Use non-skid mats or decals in the tub or shower.  If you need to sit down in the shower, use a plastic, non-slip stool.  Keep the floor dry. Clean up any water that spills on the floor as soon as it happens.  Remove soap buildup in the tub or shower regularly.  Attach bath mats securely with double-sided non-slip rug tape.  Do not have throw rugs and other things on the floor that can make you trip. WHAT CAN I DO IN THE BEDROOM?  Use night lights.  Make sure that you have a light by your bed that is easy to reach.  Do not use any sheets or  blankets that are too big for your bed. They should not hang down onto the floor.  Have a firm chair that has side arms. You can use this for support while you get dressed.  Do not have throw rugs and other things on the floor that can make you trip. WHAT CAN I DO IN THE KITCHEN?  Clean up any spills right away.  Avoid walking on wet floors.  Keep items that you use a lot in easy-to-reach places.  If you need to reach something above you, use a strong step stool that has a grab bar.  Keep electrical cords out of the way.  Do not use floor polish or wax that makes floors slippery. If you must use wax, use non-skid floor wax.  Do not have throw rugs and other things on the floor that can make you trip. WHAT CAN I DO WITH MY STAIRS?  Do not leave any items on the stairs.  Make sure that there are handrails on both sides of the stairs and use them. Fix handrails that are broken or loose. Make sure that handrails are as long as the stairways.  Check any carpeting to make sure that it is firmly attached to the stairs. Fix any carpet that is  loose or worn.  Avoid having throw rugs at the top or bottom of the stairs. If you do have throw rugs, attach them to the floor with carpet tape.  Make sure that you have a light switch at the top of the stairs and the bottom of the stairs. If you do not have them, ask someone to add them for you. WHAT ELSE CAN I DO TO HELP PREVENT FALLS?  Wear shoes that:  Do not have high heels.  Have rubber bottoms.  Are comfortable and fit you well.  Are closed at the toe. Do not wear sandals.  If you use a stepladder:  Make sure that it is fully opened. Do not climb a closed stepladder.  Make sure that both sides of the stepladder are locked into place.  Ask someone to hold it for you, if possible.  Clearly mark and make sure that you can see:  Any grab bars or handrails.  First and last steps.  Where the edge of each step is.  Use tools  that help you move around (mobility aids) if they are needed. These include:  Canes.  Walkers.  Scooters.  Crutches.  Turn on the lights when you go into a dark area. Replace any light bulbs as soon as they burn out.  Set up your furniture so you have a clear path. Avoid moving your furniture around.  If any of your floors are uneven, fix them.  If there are any pets around you, be aware of where they are.  Review your medicines with your doctor. Some medicines can make you feel dizzy. This can increase your chance of falling. Ask your doctor what other things that you can do to help prevent falls.   This information is not intended to replace advice given to you by your health care provider. Make sure you discuss any questions you have with your health care provider.   Document Released: 06/12/2009 Document Revised: 12/31/2014 Document Reviewed: 09/20/2014 Elsevier Interactive Patient Education 2016 Else[employer]. Urinary Tract Infection A urinary tract infection (UTI) can occur any place along the urinary tract. The tract includes the kidneys, ureters, bladder, and urethra. A type of germ called bacteria often causes a UTI. UTIs are often helped with antibiotic medicine.  HOME CARE   If given, take antibiotics as told by your doctor. Finish them even if you start to feel better.  Drink enough fluids to keep your pee (urine) clear or pale yellow.  Avoid tea, drinks with caffeine, and bubbly (carbonated) drinks.  Pee often. Avoid holding your pee in for a long time.  Pee before and after having sex (intercourse).  Wipe from front to back after you poop (bowel movement) if you are a woman. Use each tissue only once. GET HELP RIGHT AWAY IF:   You have back pain.  You have lower belly (abdominal) pain.  You have chills.  You feel sick to your stomach (nauseous).  You throw up (vomit).  Your burning or discomfort with peeing does not go away.  You have a  fever.  Your symptoms are not better in 3 days. MAKE SURE YOU:   Understand these instructions.  Will watch your condition.  Will get help right away if you are not doing well or get worse.   This information is not intended to replace advice given to you by your health care provider. Make sure you discuss any questions you have with your health care provider.   Document Released:  02/02/2008 Document Revised: 09/06/2014 Document Reviewed: 03/16/2012 Elsevier Interactive Patient Education [employer].

## 2015-12-08 DIAGNOSIS — Y9342 Activity, yoga: Secondary | ICD-10-CM

## 2015-12-08 NOTE — Congregational Nurse Program (Signed)
Congregational Nurse Program Note  Date of Encounter: 12/08/2015  Past Medical History: No past medical history on file.  Encounter Details:     CNP Questionnaire - 12/08/15 0943    Patient Demographics   Is this a new or existing patient? Existing   Patient is considered a/an Not Applicable   Race African-American/Black   Patient Assistance   Location of Patient Assistance Not Applicable   Patient's financial/insurance status Medicare   Uninsured Patient No   Patient referred to apply for the following financial assistance Not Applicable   Food insecurities addressed Not Applicable   Transportation assistance No   Assistance securing medications No   Educational health offerings Exercise/physical activity   Encounter Details   Primary purpose of visit Spiritual Care/Support Visit   Was an Emergency Department visit averted? Not Applicable   Does patient have a medical provider? Yes   Patient referred to Not Applicable   Was a mental health screening completed? (GAINS tool) No   Does patient have dental issues? No   Does patient have vision issues? No   Since previous encounter, have you referred patient for abnormal blood pressure that resulted in a new diagnosis or medication change? No   Since previous encounter, have you referred patient for abnormal blood glucose that resulted in a new diagnosis or medication change? No   For Abstraction Use Only   Does patient have insurance? Yes       Attended chair yoga class.

## 2015-12-19 ENCOUNTER — Encounter: Payer: Self-pay | Admitting: Internal Medicine

## 2015-12-19 ENCOUNTER — Ambulatory Visit (INDEPENDENT_AMBULATORY_CARE_PROVIDER_SITE_OTHER): Payer: Medicare Other | Admitting: Internal Medicine

## 2015-12-19 VITALS — BP 110/70 | HR 81 | Ht 63.0 in | Wt 173.0 lb

## 2015-12-19 DIAGNOSIS — Z950 Presence of cardiac pacemaker: Secondary | ICD-10-CM | POA: Diagnosis not present

## 2015-12-19 DIAGNOSIS — I5022 Chronic systolic (congestive) heart failure: Secondary | ICD-10-CM | POA: Diagnosis not present

## 2015-12-19 LAB — CUP PACEART INCLINIC DEVICE CHECK
Brady Statistic AP VP Percent: 38.82 %
Brady Statistic AP VS Percent: 0.11 %
Brady Statistic AS VP Percent: 60 %
Brady Statistic RA Percent Paced: 38.93 %
Brady Statistic RV Percent Paced: 98.82 %
Implantable Lead Implant Date: 20160208
Implantable Lead Location: 753860
Implantable Lead Model: 5076
Lead Channel Impedance Value: 380 Ohm
Lead Channel Impedance Value: 456 Ohm
Lead Channel Impedance Value: 456 Ohm
Lead Channel Impedance Value: 646 Ohm
Lead Channel Impedance Value: 703 Ohm
Lead Channel Impedance Value: 874 Ohm
Lead Channel Pacing Threshold Pulse Width: 0.4 ms
Lead Channel Sensing Intrinsic Amplitude: 2.375 mV
Lead Channel Sensing Intrinsic Amplitude: 5.5 mV
Lead Channel Setting Pacing Amplitude: 2.5 V
Lead Channel Setting Pacing Pulse Width: 0.4 ms
MDC IDC LEAD IMPLANT DT: 20160208
MDC IDC LEAD IMPLANT DT: 20160208
MDC IDC LEAD LOCATION: 753858
MDC IDC LEAD LOCATION: 753859
MDC IDC MSMT BATTERY REMAINING LONGEVITY: 56 mo
MDC IDC MSMT BATTERY VOLTAGE: 3 V
MDC IDC MSMT LEADCHNL LV PACING THRESHOLD AMPLITUDE: 1.25 V
MDC IDC MSMT LEADCHNL LV PACING THRESHOLD PULSEWIDTH: 0.8 ms
MDC IDC MSMT LEADCHNL RA IMPEDANCE VALUE: 304 Ohm
MDC IDC MSMT LEADCHNL RA IMPEDANCE VALUE: 456 Ohm
MDC IDC MSMT LEADCHNL RA PACING THRESHOLD AMPLITUDE: 0.75 V
MDC IDC MSMT LEADCHNL RA SENSING INTR AMPL: 2.25 mV
MDC IDC MSMT LEADCHNL RV IMPEDANCE VALUE: 399 Ohm
MDC IDC MSMT LEADCHNL RV PACING THRESHOLD AMPLITUDE: 0.5 V
MDC IDC MSMT LEADCHNL RV PACING THRESHOLD PULSEWIDTH: 0.4 ms
MDC IDC MSMT LEADCHNL RV SENSING INTR AMPL: 5 mV
MDC IDC SESS DTM: 20170421152420
MDC IDC SET LEADCHNL LV PACING AMPLITUDE: 2.5 V
MDC IDC SET LEADCHNL LV PACING PULSEWIDTH: 0.8 ms
MDC IDC SET LEADCHNL RA PACING AMPLITUDE: 2 V
MDC IDC SET LEADCHNL RV SENSING SENSITIVITY: 2.8 mV
MDC IDC STAT BRADY AS VS PERCENT: 1.07 %

## 2015-12-19 MED ORDER — FUROSEMIDE 40 MG PO TABS: 40.0000 mg | ORAL_TABLET | Freq: Two times a day (BID) | ORAL | Status: DC

## 2015-12-19 NOTE — Progress Notes (Signed)
HPI Kim Mullins returns today for followup. She is a pleasant 82 yo woman with a h/o HTN, chronic systolic heart failure, s/p BiV PPM, and PAF. She denies chest pain but has had some sob. She. She has tolerated her anti-coagulation though she c/o the price.   Current Outpatient Prescriptions  Medication Sig Dispense Refill  . Acetaminophen (TYLENOL) 325 MG CAPS Take 1 tablet by mouth daily as needed (pain).     Marland Kitchen apixaban (ELIQUIS) 2.5 MG TABS tablet Take 2.5 mg by mouth 2 (two) times daily.    . Ascorbic Acid (VITAMIN C PO) Take 1 tablet by mouth daily. Reported on 12/19/2015    . Bisacodyl (LAXATIVE PO) Take by mouth as directed.    Marland Kitchen CALCIUM PO Take 1 tablet by mouth daily.    . carvedilol (COREG) 3.125 MG tablet Take 3.125 mg by mouth 2 (two) times daily with a meal.    . diclofenac sodium (VOLTAREN) 1 % GEL Apply 1 application topically 3 (three) times daily as needed (pain, inflammation).   0  . diphenhydramine-acetaminophen (TYLENOL PM) 25-500 MG TABS tablet Take 1 tablet by mouth at bedtime as needed (sleep).     . dorzolamide (TRUSOPT) 2 % ophthalmic solution as directed.   11  . furosemide (LASIX) 40 MG tablet Take 1 tablet (40 mg total) by mouth 2 (two) times daily. 180 tablet 3  . timolol (TIMOPTIC) 0.5 % ophthalmic solution as directed.   11   No current facility-administered medications for this visit.     Past Medical History  Diagnosis Date  . Bowel obstruction ([place])   . Left tibial fracture 2007  . Asthma     Allergixc reaction to cats.  . Anemia     occassionally  . Arthritis     all over.  . Diabetes mellitus 2006    Diet and exercise controlled.  Marland Kitchen GERD (gastroesophageal reflux disease)     occ  . Osteoarthritis of right shoulder region 06/26/2013  . DCM (dilated cardiomyopathy) ([place]) 08/16/2014    normal coronary arteries on cath with EF 30-45%.  EF now 50% by echo 11/2014  . Chronic systolic CHF (congestive heart failure) (HCC)     EF 30-35% by  cath, echo 2016 EF 50%  . Complete heart block (HCC)     a. s/p MDT CRTP pacemaker  . OSA (obstructive sleep apnea) 10/23/2014    Moderate with AHI 21/hr  . Hypertension   . Stroke ([place])   . Atrial fibrillation ([place])     ROS:   All systems reviewed and negative except as noted in the HPI.   Past Surgical History  Procedure Laterality Date  . Abdominal hysterectomy  1969  . Dilation and curettage of uterus  1968  . Pilonidal cyst excision  1959  . Corn removal  1999    Bilateral feet  . Bunionectomy  1984    Bilateral  . Tonsillectomy  1942  . Eye surgery  1983    Surgery to fix Retainal detachment, bilateral  . Carpal tunnell  2004    Bilateral  . Total hip arthroplasty  11/16/2011    Procedure: TOTAL HIP ARTHROPLASTY ANTERIOR APPROACH;  Surgeon: Hessie Dibble, MD;  Location: [place];  Service: Orthopedics;  Laterality: Right;  DEPUY  . Total shoulder arthroplasty Right 06/26/2013    Procedure: TOTAL SHOULDER ARTHROPLASTY;  Surgeon: Johnny Bridge, MD;  Location: [place];  Service: Orthopedics;  Laterality: Right;  .  Cataract extraction Right     early 2015  . Joint replacement    . Esophagogastroduodenoscopy (egd) with propofol N/A 06/11/2014    Procedure: ESOPHAGOGASTRODUODENOSCOPY (EGD) WITH PROPOFOL;  Surgeon: Inda Castle, MD;  [facility];  Service: Endoscopy;  Laterality: N/A;  . Bravo ph study N/A 06/11/2014    Procedure: BRAVO [place] STUDY;  Surgeon: Inda Castle, MD;  Location: [facility];  Service: Endoscopy;  Laterality: N/A;  Venia Minks dilation  06/11/2014    Procedure: Venia Minks DILATION;  Surgeon: Inda Castle, MD;  [facility];  Service: Endoscopy;;  . Left and right heart catheterization with coronary angiogram N/A 08/29/2014    Procedure: LEFT AND RIGHT HEART CATHETERIZATION WITH CORONARY ANGIOGRAM;  Surgeon: Peter M Martinique, MD;  Location: [facility] CATH LAB;  Service: Cardiovascular;  Laterality: N/A;  . Bi-ventricular pacemaker  insertion N/A 10/07/2014    MDT CRTP implanted by Dr Lovena Le  . Cardiac catheterization      normal coronary arteries     Family History  Problem Relation Age of Onset  . Diabetes Maternal Grandfather   . Dementia Mother   . Coronary artery disease Mother   . Cancer Father     blood ? type  . Anuerysm Daughter     brain  . Colon cancer Neg Hx      Social History   Social History  . Marital Status: Widowed    Spouse Name: N/A  . Number of Children: 1  . Years of Education: N/A   Occupational History  . retired    Social History Main Topics  . Smoking status: Former Smoker -- 1.00 packs/day for 45 years    Types: Cigarettes    Quit date: 07/24/1978  . Smokeless tobacco: Never Used  . Alcohol Use: 4.2 oz/week    7 Glasses of wine per week     Comment: every day  . Drug Use: No  . Sexual Activity: Not Currently   Other Topics Concern  . Not on file   Social History Narrative     BP 110/70 mmHg  Pulse 81  Ht 5\' 3"  (1.6 m)  Wt 173 lb (78.472 kg)  BMI 30.65 kg/m2  Physical Exam:  Well appearing 82 yo woman, obese, NAD HEENT: Unremarkable Neck:  7 cm JVD, no thyromegally Back:  No CVA tenderness Lungs:  no wheezes, basilar rales are present HEART:  Regular rate rhythm, no murmurs, no rubs, no clicks Abd:  soft, positive bowel sounds, no organomegally, no rebound, no guarding Ext:  2 plus pulses, 1+ peripheral edema, no cyanosis, no clubbing Skin:  No rashes no nodules Neuro:  CN II through XII intact, motor grossly intact  ECG - normal sinus rhythm with AV sequential biventricular pacing  DEVICE  Normal device function.  See PaceArt for details.   Assess/Plan: 1. Acute on chronic systolic heart failure - her symptoms are class II. She has had some volume overload. I've asked the patient to increase her Lasix from 40 mg daily to 40 mg twice daily. 2. Paroxysmal atrial fibrillation - she will continue systemic anticoagulation. No antiarrhythmic drug  therapy at this time. 3. Hypertension - her blood pressure today is well controlled. She is encouraged to maintain a low-sodium diet 4. Biventricular pacemaker - her Medtronic biventricular pacemaker is working normally. We'll plan to recheck in several months.

## 2015-12-19 NOTE — Patient Instructions (Signed)
Medication Instructions:  Your physician has recommended you make the following change in your medication:  1) Increase Furosemide to 40 mg twice daily    Labwork: Your physician recommends that you return for lab work in 2 weeks    Testing/Procedures: None ordered   Follow-Up: Your physician wants you to follow-up in: 12 months with Dr Knox Saliva will receive a reminder letter in the mail two months in advance. If you don't receive a letter, please call our office to schedule the follow-up appointment.  Remote monitoring is used to monitor your Pacemaker from home. This monitoring reduces the number of office visits required to check your device to one time per year. It allows [place] to keep an eye on the functioning of your device to ensure it is working properly. You are scheduled for a device check from home on 03/22/16. You may send your transmission at any time that day. If you have a wireless device, the transmission will be sent automatically. After your physician reviews your transmission, you will receive a postcard with your next transmission date.     Any Other Special Instructions Will Be Listed Below (If Applicable).     If you need a refill on your cardiac medications before your next appointment, please call your pharmacy.

## 2015-12-29 ENCOUNTER — Other Ambulatory Visit (INDEPENDENT_AMBULATORY_CARE_PROVIDER_SITE_OTHER): Payer: Medicare Other

## 2015-12-29 DIAGNOSIS — I10 Essential (primary) hypertension: Secondary | ICD-10-CM | POA: Diagnosis not present

## 2015-12-29 DIAGNOSIS — I480 Paroxysmal atrial fibrillation: Secondary | ICD-10-CM

## 2015-12-29 LAB — BASIC METABOLIC PANEL
BUN: 21 mg/dL (ref 7–25)
CHLORIDE: 103 mmol/L (ref 98–110)
CO2: 27 mmol/L (ref 20–31)
Calcium: 9.1 mg/dL (ref 8.6–10.4)
Creat: 0.98 mg/dL — ABNORMAL HIGH (ref 0.60–0.88)
Glucose, Bld: 96 mg/dL (ref 65–99)
POTASSIUM: 4.2 mmol/L (ref 3.5–5.3)
SODIUM: 141 mmol/L (ref 135–146)

## 2016-01-05 DIAGNOSIS — IMO0001 Reserved for inherently not codable concepts without codable children: Secondary | ICD-10-CM

## 2016-01-05 DIAGNOSIS — Y9342 Activity, yoga: Secondary | ICD-10-CM

## 2016-01-05 NOTE — Congregational Nurse Program (Signed)
Congregational Nurse Program Note  Date of Encounter: 01/05/2016  Past Medical History: No past medical history on file.  Encounter Details:     CNP Questionnaire - 01/05/16 1118    Encounter Details   Was a mental health screening completed? (GAINS tool) No   Does patient have dental issues? No   Does patient have vision issues? No   Does your patient have an abnormal blood pressure today? No   Since previous encounter, have you referred patient for abnormal blood pressure that resulted in a new diagnosis or medication change? No   Does your patient have an abnormal blood glucose today? No       Attended chair yoga class.

## 2016-01-21 ENCOUNTER — Encounter: Payer: Self-pay | Admitting: Cardiology

## 2016-01-21 ENCOUNTER — Ambulatory Visit (INDEPENDENT_AMBULATORY_CARE_PROVIDER_SITE_OTHER): Payer: Medicare Other | Admitting: Cardiology

## 2016-01-21 VITALS — BP 116/80 | HR 73 | Ht 63.0 in | Wt 169.8 lb

## 2016-01-21 DIAGNOSIS — I5022 Chronic systolic (congestive) heart failure: Secondary | ICD-10-CM

## 2016-01-21 DIAGNOSIS — I10 Essential (primary) hypertension: Secondary | ICD-10-CM | POA: Diagnosis not present

## 2016-01-21 DIAGNOSIS — I272 Other secondary pulmonary hypertension: Secondary | ICD-10-CM

## 2016-01-21 DIAGNOSIS — I480 Paroxysmal atrial fibrillation: Secondary | ICD-10-CM

## 2016-01-21 DIAGNOSIS — I420 Dilated cardiomyopathy: Secondary | ICD-10-CM | POA: Diagnosis not present

## 2016-01-21 DIAGNOSIS — G4733 Obstructive sleep apnea (adult) (pediatric): Secondary | ICD-10-CM | POA: Diagnosis not present

## 2016-01-21 NOTE — Progress Notes (Signed)
Cardiology Office Note    Date:  01/21/2016   ID:  DIM CARDI, DOB Apr 18, 1930, MRN [id number]  PCP:  Kristine Garbe, MD  Cardiologist:  Fransico Him, MD   Chief Complaint  Patient presents with  . Congestive Heart Failure  . Sleep Apnea  . Hypertension    History of Present Illness:  Kim Mullins is a 82 y.o. female with a history of asthma, anemia, GERD with esophageal stricture s/p dilatation, DM diet controlled, normal coronary arteries with severe LV dysfunction EF 30-35% with 2+ MR and mild to moderate pulmonary HTN by cath and OSA on CPAP. She had a sleep study done showing moderate OSA with an AHI of 21/hr and underwent BIPAP titration to 10/6cm H2O. Repeat 2D echo 11/2014 on medical therapy showed improvement in EF to 50%.  She denies any LE edema, chest pain, DOE, dizziness or syncope.She is doing well with her BiPAP device. She tolerates her full face mask but doesn't like it because it blows into her eyes.  She feels the pressure is adequate.   Past Medical History  Diagnosis Date  . Bowel obstruction ([place])   . Left tibial fracture 2007  . Asthma     Allergixc reaction to cats.  . Anemia     occassionally  . Arthritis     all over.  . Diabetes mellitus 2006    Diet and exercise controlled.  Marland Kitchen GERD (gastroesophageal reflux disease)     occ  . Osteoarthritis of right shoulder region 06/26/2013  . DCM (dilated cardiomyopathy) ([place]) 08/16/2014    normal coronary arteries on cath with EF 30-45%.  EF now 50% by echo 11/2014  . Chronic systolic CHF (congestive heart failure) (HCC)     EF 30-35% by cath, echo 2016 EF 50%  . Complete heart block (HCC)     a. s/p MDT CRTP pacemaker  . OSA (obstructive sleep apnea) 10/23/2014    Moderate with AHI 21/hr  . Hypertension   . Stroke ([place])   . Atrial fibrillation [facility])     Past Surgical History  Procedure Laterality Date  . Abdominal hysterectomy  1969  . Dilation and curettage of uterus  1968  .  Pilonidal cyst excision  1959  . Corn removal  1999    Bilateral feet  . Bunionectomy  1984    Bilateral  . Tonsillectomy  1942  . Eye surgery  1983    Surgery to fix Retainal detachment, bilateral  . Carpal tunnell  2004    Bilateral  . Total hip arthroplasty  11/16/2011    Procedure: TOTAL HIP ARTHROPLASTY ANTERIOR APPROACH;  Surgeon: Hessie Dibble, MD;  Location: [place];  Service: Orthopedics;  Laterality: Right;  DEPUY  . Total shoulder arthroplasty Right 06/26/2013    Procedure: TOTAL SHOULDER ARTHROPLASTY;  Surgeon: Johnny Bridge, MD;  Location: [place];  Service: Orthopedics;  Laterality: Right;  . Cataract extraction Right     early 2015  . Joint replacement    . Esophagogastroduodenoscopy (egd) with propofol N/A 06/11/2014    Procedure: ESOPHAGOGASTRODUODENOSCOPY (EGD) WITH PROPOFOL;  Surgeon: Inda Castle, MD;  [facility];  Service: Endoscopy;  Laterality: N/A;  . Bravo ph study N/A 06/11/2014    Procedure: BRAVO [place] STUDY;  Surgeon: Inda Castle, MD;  [facility];  Service: Endoscopy;  Laterality: N/A;  Venia Minks dilation  06/11/2014    Procedure: Venia Minks DILATION;  Surgeon: Inda Castle, MD;  Location: [facility];  Service: Endoscopy;;  . Left and right heart catheterization with coronary angiogram N/A 08/29/2014    Procedure: LEFT AND RIGHT HEART CATHETERIZATION WITH CORONARY ANGIOGRAM;  Surgeon: Peter M Martinique, MD;  Location: [facility] CATH LAB;  Service: Cardiovascular;  Laterality: N/A;  . Bi-ventricular pacemaker insertion N/A 10/07/2014    MDT CRTP implanted by Dr Lovena Le  . Cardiac catheterization      normal coronary arteries    Current Medications: Outpatient Prescriptions Prior to Visit  Medication Sig Dispense Refill  . Acetaminophen (TYLENOL) 325 MG CAPS Take 1 tablet by mouth daily as needed (pain).     Marland Kitchen apixaban (ELIQUIS) 2.5 MG TABS tablet Take 2.5 mg by mouth 2 (two) times daily.    . Ascorbic Acid (VITAMIN C PO) Take 1 tablet  by mouth daily. Reported on 12/19/2015    . Bisacodyl (LAXATIVE PO) Take by mouth as directed.    Marland Kitchen CALCIUM PO Take 1 tablet by mouth daily.    . carvedilol (COREG) 3.125 MG tablet Take 3.125 mg by mouth 2 (two) times daily with a meal.    . diclofenac sodium (VOLTAREN) 1 % GEL Apply 1 application topically 3 (three) times daily as needed (pain, inflammation).   0  . diphenhydramine-acetaminophen (TYLENOL PM) 25-500 MG TABS tablet Take 1 tablet by mouth at bedtime as needed (sleep).     . dorzolamide (TRUSOPT) 2 % ophthalmic solution as directed.   11  . furosemide (LASIX) 40 MG tablet Take 1 tablet (40 mg total) by mouth 2 (two) times daily. 180 tablet 3  . timolol (TIMOPTIC) 0.5 % ophthalmic solution as directed.   11   No facility-administered medications prior to visit.     Allergies:   Hydrocodone; Percocet; Mercurial derivatives; Eggs or egg-derived products; Penicillins; and Quinine derivatives   Social History   Social History  . Marital Status: Widowed    Spouse Name: N/A  . Number of Children: 1  . Years of Education: N/A   Occupational History  . retired    Social History Main Topics  . Smoking status: Former Smoker -- 1.00 packs/day for 45 years    Types: Cigarettes    Quit date: 07/24/1978  . Smokeless tobacco: Never Used  . Alcohol Use: 4.2 oz/week    7 Glasses of wine per week     Comment: every day  . Drug Use: No  . Sexual Activity: Not Currently   Other Topics Concern  . None   Social History Narrative     Family History:  The patient's family history includes Anuerysm in her daughter; Cancer in her father; Coronary artery disease in her mother; Dementia in her mother; Diabetes in her maternal grandfather. There is no history of Colon cancer.   ROS:   Please see the history of present illness.    ROS All other systems reviewed and are negative.   PHYSICAL EXAM:   VS:  BP 116/80 mmHg  Pulse 73  Ht 5\' 3"  (1.6 m)  Wt 169 lb 12.8 oz (77.021 kg)  BMI  30.09 kg/m2   GEN: Well nourished, well developed, in no acute distress HEENT: normal Neck: no JVD, carotid bruits, or masses Cardiac: RRR; no murmurs, rubs, or gallops,no edema.  Intact distal pulses bilaterally.  Respiratory:  clear to auscultation bilaterally, normal work of breathing GI: soft, nontender, nondistended, + BS MS: no deformity or atrophy Skin: warm and dry, no rash Neuro:  Alert and Oriented x 3, Strength and sensation are  intact Psych: euthymic mood, full affect  Wt Readings from Last 3 Encounters:  01/21/16 169 lb 12.8 oz (77.021 kg)  12/19/15 173 lb (78.472 kg)  09/12/15 172 lb 3.2 oz (78.109 kg)      Studies/Labs Reviewed:   EKG:  EKG is not ordered today.    Recent Labs: 01/24/2015: Pro B Natriuretic peptide (BNP) 263.0* 09/12/2015: ALT 9 11/23/2015: Hemoglobin 11.3*; Platelets 242 12/29/2015: BUN 21; Creat 0.98*; Potassium 4.2; Sodium 141   Lipid Panel    Component Value Date/Time   CHOL 110 06/19/2015 0357   TRIG 55 06/19/2015 0357   HDL 49 06/19/2015 0357   CHOLHDL 2.2 06/19/2015 0357   VLDL 11 06/19/2015 0357   LDLCALC 50 06/19/2015 0357    Additional studies/ records that were reviewed today include:  CPAP d/l    ASSESSMENT:    1. OSA (obstructive sleep apnea)   2. Benign essential HTN   3. DCM (dilated cardiomyopathy) ([place])   4. Pulmonary HTN ([place])   5. Chronic systolic CHF (congestive heart failure) (HCC)   6. Paroxysmal atrial fibrillation (HCC)      PLAN:  In order of problems listed above:  OSA - the patient is tolerating PAP therapy well without any problems. The PAP download was reviewed today and showed an AHI of 13.3/hr on auto settings with 83% compliance in using more than 4 hours nightly.  The patient has been using and benefiting from CPAP use and will continue to benefit from therapy.   She would like to try a new mask because of mask leaking in her eyes.  I will have her try a nasal pillow mask with chin strap and repeat  d/l in 2 months.  Her AHI was high this time most likely due to mask leak. HTN - Bp controlled on current medical therapy.  Continue BB Nonischemic DCM -  S/p BiV PPM - EF now 50% Pulmonary HTN - mild with PASP 35mmHg by echo a year ago.  Repeat echo to reassess.   Chronic systolic CHF - she is Class II and appears euvolemic on exam today.  Continue BB and diuretic.   PAF- this was diagnosed 08/2015 and was started on Eliquis for CHADS2VASC score of 7 -  maintaining NSR.  Continue Apixaban (dosed at 2.5mg  BID by EP but she only has age > 44 and creatinine is normal and weight is > 60kg so will increase dose to 5mg  BID.  Continue BB. Complete HB s/p BiVPPM    Medication Adjustments/Labs and Tests Ordered: Current medicines are reviewed at length with the patient today.  Concerns regarding medicines are outlined above.  Medication changes, Labs and Tests ordered today are listed in the Patient Instructions below.  There are no Patient Instructions on file for this visit.   Signed, Fransico Him, MD  01/21/2016 2:59 PM    [address], [place], [place]  [address] Phone: [phone]; Fax: [phone]

## 2016-01-21 NOTE — Patient Instructions (Signed)
Medication Instructions:  Your physician recommends that you continue on your current medications as directed. Please refer to the Current Medication list given to you today.   Labwork: None  Testing/Procedures: Your physician has requested that you have an echocardiogram. Echocardiography is a painless test that uses sound waves to create images of your heart. It provides your doctor with information about the size and shape of your heart and how well your heart's chambers and valves are working. This procedure takes approximately one hour. There are no restrictions for this procedure.  Follow-Up: Your physician wants you to follow-up in: 6 months with Dr. Turner. You will receive a reminder letter in the mail two months in advance. If you don't receive a letter, please call our office to schedule the follow-up appointment.   Any Other Special Instructions Will Be Listed Below (If Applicable).     If you need a refill on your cardiac medications before your next appointment, please call your pharmacy.   

## 2016-01-28 ENCOUNTER — Encounter: Payer: Self-pay | Admitting: Cardiology

## 2016-01-29 ENCOUNTER — Telehealth: Payer: Self-pay

## 2016-01-29 ENCOUNTER — Encounter: Payer: Self-pay | Admitting: Internal Medicine

## 2016-01-29 ENCOUNTER — Encounter: Payer: Self-pay | Admitting: Cardiology

## 2016-01-29 NOTE — Telephone Encounter (Signed)
-----   Message -----      From: [name]     Sent: 01/29/2016 12:33 PM      To: [facility] Triage    Subject: Non-Urgent Medical Question                 Can I use gas and novacain for dental surgery tomorrow? Can I take my pressure and water meds w/o food.?     Returned call to patient to clarify message. She is to get a tooth pulled tomorrow. She wanted to get medication instructions prior to appointment. Called Dr. Raynelle Dick office [phone]) and spoke with Vaughan Basta. She st patient instructions have already been reviewed with patient and no cardiac clearance was needed. One tooth is being pulled with nitrous. Called patient again to relay Linda's information. Patient did not pick up. Left VM instructing the patient to do as she was told by dentist and to call Dr. Raynelle Dick office if she has any further questions.

## 2016-02-02 ENCOUNTER — Encounter: Payer: Self-pay | Admitting: Cardiology

## 2016-02-03 ENCOUNTER — Encounter: Payer: Self-pay | Admitting: Neurology

## 2016-02-03 ENCOUNTER — Ambulatory Visit (INDEPENDENT_AMBULATORY_CARE_PROVIDER_SITE_OTHER): Payer: Medicare Other | Admitting: Neurology

## 2016-02-03 VITALS — BP 100/69 | HR 74 | Ht 63.0 in | Wt 169.2 lb

## 2016-02-03 DIAGNOSIS — I639 Cerebral infarction, unspecified: Secondary | ICD-10-CM | POA: Diagnosis not present

## 2016-02-03 DIAGNOSIS — W19XXXS Unspecified fall, sequela: Secondary | ICD-10-CM

## 2016-02-03 DIAGNOSIS — R2990 Unspecified symptoms and signs involving the nervous system: Secondary | ICD-10-CM | POA: Diagnosis not present

## 2016-02-03 NOTE — Progress Notes (Signed)
[facility] [address] [place]. [place] [address] [phone]       OFFICE FOLLOW-UP NOTE  [name] Date of Birth:  1930/03/21 Medical Record Number:  [id number]   HPI:  Office visit 08/08/2015 : 59 year lady seen today for first office follow-up visit following hospital admission for stroke in October 2016.Kim Mullins is an 82 y.o. female who was at church doing yoga today and felt normal. At 1200 she left her yoga class and noted she could not see out the right aspect of her vision (LKW 1200 06/18/2015). She drove to her opthalmologist and she was sent to [facility] for fear of stroke. While in ED code stroke was called due to right hemianopsia. She was within the window and tPA was administered due to visual field cut. Modified Rankin: Rankin Score=0 She was admitted to [facility] for further evaluation and treatment. She was given IV TPA and showed significant improvement. CT scan of the head on admission showed no acute infarct patient could not have an MRI due to pacemaker repeat CT angiogram done 24 hours later showed no large vessel occlusion and no evidence of acute occipital infarct. Transthoracic echo showed normal ejection fraction. Carotid Doppler showed no significant extra cranial stenosis. LDL cholesterol is 50 mg percent and hemoglobin A1c was 5.8. Patient was on aspirin 81 mg and this was changed to 325 mg and Plavix 75 mg daily. Patient states she's done well since discharge her vision is completely back to normal. She has had no new symptoms. She in fact had Surgery done 2 weeks ago which went well. She is having some minor bruises on Plavix. She has been active and has started doing water aerobics. Blood pressure is well controlled and 96/60 today. Update 02/03/2016 : She returns for follow-up after last visit 6 months ago. She continued to do well from the stroke standpoint without recurrent stroke or TIA symptoms. She states the peripheral  vision is improved completely. She has been found to have atrial fibrillation and started on eliquis which is tolerating well without bleeding or bruising. She fell in February and bumped her head and was seen in the emergency room but fortunately did not have any internal bleeding or serious injuries. She continues to live alone and is independent in activities of daily living. She she knows her balance is poor and hence uses a cane at all times. She had dental surgery done last Friday and eliquis was held for a few days and has been restarted. She states her blood pressure is well controlled and today it is 1-0/69. He  ROS:   14 system review of systems is positive for  hearing loss, ringing in the ears, runny nose, shortness of breath, apnea and all systems negative  PMH:  Past Medical History  Diagnosis Date  . Bowel obstruction ([facility])   . Left tibial fracture 2007  . Asthma     Allergixc reaction to cats.  . Anemia     occassionally  . Arthritis     all over.  . Diabetes mellitus 2006    Diet and exercise controlled.  Marland Kitchen GERD (gastroesophageal reflux disease)     occ  . Osteoarthritis of right shoulder region 06/26/2013  . DCM (dilated cardiomyopathy) ([facility]) 08/16/2014    normal coronary arteries on cath with EF 30-45%.  EF now 50% by echo 11/2014  . Chronic systolic CHF (congestive heart failure) (H[facility])     EF 30-35% by cath,  echo 2016 EF 50%  . Complete heart block ([facility])     a. s/p MDT CRTP pacemaker  . OSA (obstructive sleep apnea) 10/23/2014    Moderate with AHI 21/hr  . Hypertension   . Stroke ([facility])   . Atrial fibrillation [facility])     Social History:  Social History   Social History  . Marital Status: Widowed    Spouse Name: N/A  . Number of Children: 1  . Years of Education: N/A   Occupational History  . retired    Social History Main Topics  . Smoking status: Former Smoker -- 1.00 packs/day for 45 years    Types: Cigarettes    Quit date: 07/24/1978  . Smokeless  tobacco: Never Used  . Alcohol Use: 4.2 oz/week    7 Glasses of wine per week     Comment: every day  . Drug Use: No  . Sexual Activity: Not Currently   Other Topics Concern  . Not on file   Social History Narrative    Medications:   Current Outpatient Prescriptions on File Prior to Visit  Medication Sig Dispense Refill  . Acetaminophen (TYLENOL) 325 MG CAPS Take 1 tablet by mouth daily as needed (pain).     Marland Kitchen apixaban (ELIQUIS) 2.5 MG TABS tablet Take 2.5 mg by mouth 2 (two) times daily.    . Ascorbic Acid (VITAMIN C PO) Take 1 tablet by mouth daily. Reported on 12/19/2015    . Bisacodyl (LAXATIVE PO) Take by mouth as directed.    Marland Kitchen CALCIUM PO Take 1 tablet by mouth daily.    . carvedilol (COREG) 3.125 MG tablet Take 3.125 mg by mouth 2 (two) times daily with a meal.    . diclofenac sodium (VOLTAREN) 1 % GEL Apply 1 application topically 3 (three) times daily as needed (pain, inflammation).   0  . diphenhydramine-acetaminophen (TYLENOL PM) 25-500 MG TABS tablet Take 1 tablet by mouth at bedtime as needed (sleep).     . dorzolamide (TRUSOPT) 2 % ophthalmic solution as directed.   11  . furosemide (LASIX) 40 MG tablet Take 1 tablet (40 mg total) by mouth 2 (two) times daily. 180 tablet 3  . timolol (TIMOPTIC) 0.5 % ophthalmic solution as directed.   11   No current facility-administered medications on file prior to visit.    Allergies:   Allergies  Allergen Reactions  . Hydrocodone Other (See Comments)    Took away all energy and made her stop eating food for short time  . Percocet [Oxycodone-Acetaminophen] Other (See Comments)    Too away all energy and made her stop eating food for short time  . Mercurial Derivatives   . Eggs Or Egg-Derived Products Hives and Rash  . Penicillins Hives and Rash    Has patient had a PCN reaction causing immediate rash, facial/tongue/throat swelling, SOB or lightheadedness with hypotension:  Has patient had a PCN reaction causing severe rash  involving mucus membranes or skin necrosis:  Has patient had a PCN reaction that required hospitalization  Has patient had a PCN reaction occurring within the last 10 years:  If all of the above answers are "NO", then may proceed with Cephalosporin use.  . Quinine Derivatives Hives and Rash    Physical Exam General: well developed, well nourished, seated, in no evident distress Head: head normocephalic and atraumatic.  Neck: supple with no carotid or supraclavicular bruits Cardiovascular: regular rate and rhythm, no murmurs Musculoskeletal: no deformity Skin:  no rash/petichiae Vascular:  Normal pulses all extremities Filed Vitals:   02/03/16 1507  BP: 100/69  Pulse: 74   Neurologic Exam Mental Status: Awake and fully alert. Oriented to place and time. Recent and remote memory intact. Attention span, concentration and fund of knowledge appropriate. Mood and affect appropriate.  Cranial Nerves: Fundoscopic exam reveals sharp disc margins. Pupils equal, briskly reactive to light. Extraocular movements full without nystagmus. Visual fields full to confrontation. Hearing intact. Facial sensation intact. Face, tongue, palate moves normally and symmetrically.  Motor: Normal bulk and tone. Normal strength in all tested extremity muscles. Sensory.: intact to touch ,pinprick .position and vibratory sensation.  Coordination: Rapid alternating movements normal in all extremities. Finger-to-nose and heel-to-shin performed accurately bilaterally. Gait and Station: Arises from chair without difficulty. Stance is broad based mild retropulsion. Tends to fall backwards l Uses a cane for ambulation  Reflexes: 1+ and symmetric. Toes downgoing.     ASSESSMENT: 86 year with suspected left occipital infarct in October [id number] of embolic etiology without identified source stated with IV TPA with excellent clinical recovery. Vascular risk factors of   HT, CHF and atrial fibrillation.    PLAN: I had a long  d/w patient about her remote stroke,atrial fibrillation, risk for recurrent stroke/TIAs, personally independently reviewed imaging studies and stroke evaluation results and answered questions.Continue Eliquis (apixaban) daily  for secondary stroke prevention and maintain strict control of hypertension with blood pressure goal below 130/90, diabetes with hemoglobin A1c goal below 6.5% and lipids with LDL cholesterol goal below 70 mg/dL. I also advised the patient to eat a healthy diet with plenty of whole grains, cereals, fruits and vegetables, exercise regularly and maintain ideal body weight .we also talked about fall prevention precautions and I advised her to use a cane at all times to prevent falls. No scheduled followup in the future with me is necessary but may be referred back if needed.Greater than 50% of time during this 25 minute visit was spent on counseling,explanation of diagnosis, planning of further management, discussion with patient and family and coordination of care  Antony Contras, MD Note: This document was prepared with digital dictation and possible smart phrase technology. Any transcriptional errors that result from this process are unintentional

## 2016-02-03 NOTE — Patient Instructions (Signed)
I had a long d/w patient about her remote stroke,atrial fibrillation, risk for recurrent stroke/TIAs, personally independently reviewed imaging studies and stroke evaluation results and answered questions.Continue Eliquis (apixaban) daily  for secondary stroke prevention and maintain strict control of hypertension with blood pressure goal below 130/90, diabetes with hemoglobin A1c goal below 6.5% and lipids with LDL cholesterol goal below 70 mg/dL. I also advised the patient to eat a healthy diet with plenty of whole grains, cereals, fruits and vegetables, exercise regularly and maintain ideal body weight .we also talked about fall prevention precautions and I advised her to use a cane at all times to prevent falls. No scheduled followup in the future with me is necessary but may be referred back if needed. Fall Prevention in the Home  Falls can cause injuries. They can happen to people of all ages. There are many things you can do to make your home safe and to help prevent falls.  WHAT CAN I DO ON THE OUTSIDE OF MY HOME?  Regularly fix the edges of walkways and driveways and fix any cracks.  Remove anything that might make you trip as you walk through a door, such as a raised step or threshold.  Trim any bushes or trees on the path to your home.  Use bright outdoor lighting.  Clear any walking paths of anything that might make someone trip, such as rocks or tools.  Regularly check to see if handrails are loose or broken. Make sure that both sides of any steps have handrails.  Any raised decks and porches should have guardrails on the edges.  Have any leaves, snow, or ice cleared regularly.  Use sand or salt on walking paths during winter.  Clean up any spills in your garage right away. This includes oil or grease spills. WHAT CAN I DO IN THE BATHROOM?   Use night lights.  Install grab bars by the toilet and in the tub and shower. Do not use towel bars as grab bars.  Use non-skid mats  or decals in the tub or shower.  If you need to sit down in the shower, use a plastic, non-slip stool.  Keep the floor dry. Clean up any water that spills on the floor as soon as it happens.  Remove soap buildup in the tub or shower regularly.  Attach bath mats securely with double-sided non-slip rug tape.  Do not have throw rugs and other things on the floor that can make you trip. WHAT CAN I DO IN THE BEDROOM?  Use night lights.  Make sure that you have a light by your bed that is easy to reach.  Do not use any sheets or blankets that are too big for your bed. They should not hang down onto the floor.  Have a firm chair that has side arms. You can use this for support while you get dressed.  Do not have throw rugs and other things on the floor that can make you trip. WHAT CAN I DO IN THE KITCHEN?  Clean up any spills right away.  Avoid walking on wet floors.  Keep items that you use a lot in easy-to-reach places.  If you need to reach something above you, use a strong step stool that has a grab bar.  Keep electrical cords out of the way.  Do not use floor polish or wax that makes floors slippery. If you must use wax, use non-skid floor wax.  Do not have throw rugs and other things on  the floor that can make you trip. WHAT CAN I DO WITH MY STAIRS?  Do not leave any items on the stairs.  Make sure that there are handrails on both sides of the stairs and use them. Fix handrails that are broken or loose. Make sure that handrails are as long as the stairways.  Check any carpeting to make sure that it is firmly attached to the stairs. Fix any carpet that is loose or worn.  Avoid having throw rugs at the top or bottom of the stairs. If you do have throw rugs, attach them to the floor with carpet tape.  Make sure that you have a light switch at the top of the stairs and the bottom of the stairs. If you do not have them, ask someone to add them for you. WHAT ELSE CAN I DO TO  HELP PREVENT FALLS?  Wear shoes that:  Do not have high heels.  Have rubber bottoms.  Are comfortable and fit you well.  Are closed at the toe. Do not wear sandals.  If you use a stepladder:  Make sure that it is fully opened. Do not climb a closed stepladder.  Make sure that both sides of the stepladder are locked into place.  Ask someone to hold it for you, if possible.  Clearly mark and make sure that you can see:  Any grab bars or handrails.  First and last steps.  Where the edge of each step is.  Use tools that help you move around (mobility aids) if they are needed. These include:  Canes.  Walkers.  Scooters.  Crutches.  Turn on the lights when you go into a dark area. Replace any light bulbs as soon as they burn out.  Set up your furniture so you have a clear path. Avoid moving your furniture around.  If any of your floors are uneven, fix them.  If there are any pets around you, be aware of where they are.  Review your medicines with your doctor. Some medicines can make you feel dizzy. This can increase your chance of falling. Ask your doctor what other things that you can do to help prevent falls.   This information is not intended to replace advice given to you by your health care provider. Make sure you discuss any questions you have with your health care provider.   Document Released: 06/12/2009 Document Revised: 12/31/2014 Document Reviewed: 09/20/2014 Elsevier Interactive Patient Education [employer].

## 2016-02-04 ENCOUNTER — Encounter: Payer: Self-pay | Admitting: Cardiology

## 2016-02-06 ENCOUNTER — Ambulatory Visit (HOSPITAL_COMMUNITY): Payer: Medicare Other | Attending: Cardiology

## 2016-02-06 ENCOUNTER — Other Ambulatory Visit: Payer: Self-pay

## 2016-02-06 DIAGNOSIS — G4733 Obstructive sleep apnea (adult) (pediatric): Secondary | ICD-10-CM | POA: Insufficient documentation

## 2016-02-06 DIAGNOSIS — I071 Rheumatic tricuspid insufficiency: Secondary | ICD-10-CM | POA: Diagnosis not present

## 2016-02-06 DIAGNOSIS — I420 Dilated cardiomyopathy: Secondary | ICD-10-CM | POA: Insufficient documentation

## 2016-02-06 DIAGNOSIS — I340 Nonrheumatic mitral (valve) insufficiency: Secondary | ICD-10-CM | POA: Insufficient documentation

## 2016-02-06 DIAGNOSIS — I509 Heart failure, unspecified: Secondary | ICD-10-CM | POA: Insufficient documentation

## 2016-02-06 DIAGNOSIS — I272 Other secondary pulmonary hypertension: Secondary | ICD-10-CM | POA: Insufficient documentation

## 2016-02-06 DIAGNOSIS — I442 Atrioventricular block, complete: Secondary | ICD-10-CM | POA: Insufficient documentation

## 2016-02-06 DIAGNOSIS — I110 Hypertensive heart disease with heart failure: Secondary | ICD-10-CM | POA: Diagnosis not present

## 2016-02-06 DIAGNOSIS — I371 Nonrheumatic pulmonary valve insufficiency: Secondary | ICD-10-CM | POA: Insufficient documentation

## 2016-02-06 DIAGNOSIS — I422 Other hypertrophic cardiomyopathy: Secondary | ICD-10-CM | POA: Insufficient documentation

## 2016-02-06 LAB — ECHOCARDIOGRAM COMPLETE
AOASC: 32 cm
CHL CUP DOP CALC LVOT VTI: 11.5 cm
CHL CUP RV SYS PRESS: 46 mmHg
E decel time: 236 msec
EERAT: 27.8
FS: 10 % — AB (ref 28–44)
IV/PV OW: 1
LA diam end sys: 42 mm
LA vol A4C: 50.9 ml
LA vol: 56.7 mL
LADIAMINDEX: 2.24 cm/m2
LASIZE: 42 mm
LAVOLIN: 30.3 mL/m2
LV E/e' medial: 27.8
LV TDI E'MEDIAL: 3.09
LVEEAVG: 27.8
LVELAT: 2.59 cm/s
LVOT SV: 36 mL
LVOT area: 3.14 cm2
LVOT diameter: 20 mm
LVOT peak vel: 67.4 cm/s
MV Dec: 236
MVPG: 2 mmHg
MVPKAVEL: 18.4 m/s
MVPKEVEL: 72 m/s
PV Reg vel dias: 107 cm/s
PW: 14 mm — AB (ref 0.6–1.1)
RV LATERAL S' VELOCITY: 7.15 cm/s
RV TAPSE: 11.1 mm
Reg peak vel: 310 cm/s
TDI e' lateral: 2.59
TRMAXVEL: 310 cm/s

## 2016-02-09 ENCOUNTER — Telehealth: Payer: Self-pay

## 2016-02-09 DIAGNOSIS — R0989 Other specified symptoms and signs involving the circulatory and respiratory systems: Secondary | ICD-10-CM

## 2016-02-09 DIAGNOSIS — I272 Pulmonary hypertension, unspecified: Secondary | ICD-10-CM

## 2016-02-09 DIAGNOSIS — I5022 Chronic systolic (congestive) heart failure: Secondary | ICD-10-CM

## 2016-02-09 MED ORDER — LISINOPRIL 10 MG PO TABS: 10.0000 mg | ORAL_TABLET | Freq: Every day | ORAL | Status: DC

## 2016-02-09 NOTE — Telephone Encounter (Signed)
Informed patient of results and verbal understanding expressed.   Instructed patient to START LISINOPRIL 10 mg daily. Referral for CHF Clinic placed. Patient agrees with treatment plan.

## 2016-02-09 NOTE — Telephone Encounter (Signed)
-----   Message from Sueanne Margarita, MD sent at 02/07/2016  9:25 PM EDT ----- Echo showed moderate LVH with severely reduced LVF EF 15-20% with increased stiffness of heart muscle with mildly leaky MV, moderately enlarged RV with moderately reduced RVF and moderate TR with moderate pulmonary HTN.  Please start Lisinopril 10mg  daily and please set patient up in CHF clinic ASAP in next 2 weeks.

## 2016-02-10 ENCOUNTER — Telehealth: Payer: Self-pay

## 2016-02-10 ENCOUNTER — Encounter: Payer: Self-pay | Admitting: Internal Medicine

## 2016-02-10 ENCOUNTER — Encounter: Payer: Self-pay | Admitting: Cardiology

## 2016-02-10 DIAGNOSIS — Y9342 Activity, yoga: Secondary | ICD-10-CM

## 2016-02-10 NOTE — Telephone Encounter (Signed)
Sent patient MyChart message to call [facility] for new mask.

## 2016-02-10 NOTE — Congregational Nurse Program (Signed)
Congregational Nurse Program Note  Date of Encounter: 02/10/2016  Past Medical History: No past medical history on file.  Encounter Details:     CNP Questionnaire - 02/10/16 1429    Patient Demographics   Is this a new or existing patient? Existing   Patient is considered a/an Not Applicable   Race African-American/Black   Patient Assistance   Location of Patient Assistance Not Applicable   Patient's financial/insurance status Medicare   Uninsured Patient No   Patient referred to apply for the following financial assistance Not Applicable   Food insecurities addressed Not Applicable   Transportation assistance No   Assistance securing medications No   Educational health offerings Exercise/physical activity   Encounter Details   Primary purpose of visit Spiritual Care/Support Visit   Was an Emergency Department visit averted? Not Applicable   Does patient have a medical provider? Yes   Patient referred to Not Applicable   Was a mental health screening completed? (GAINS tool) No   Does patient have dental issues? No   Does patient have vision issues? No   Does your patient have an abnormal blood pressure today? No   Since previous encounter, have you referred patient for abnormal blood pressure that resulted in a new diagnosis or medication change? No   Does your patient have an abnormal blood glucose today? No   Since previous encounter, have you referred patient for abnormal blood glucose that resulted in a new diagnosis or medication change? No   Was there a life-saving intervention made? No       Attended chair yoga class.

## 2016-02-10 NOTE — Telephone Encounter (Signed)
-----   Message from Julian sent at 02/10/2016  1:06 PM EDT ----- Regarding: RE: new mask Do you have any other contact numbers?  We have left mutliple VM on the pt's phone.  Thanks!   ----- Message -----    From: Theodoro Parma, RN    Sent: 02/09/2016   6:09 PM      To: Darlina Guys, Bethany [place], [place] Subject: FW: new mask                                   Will someone from [facility] call this patient please? She just called saying she hadn't heard from anyone. She hasn't been wearing her PAP because of the mask.  Thanks so much! ----- Message -----    From: Theodoro Parma, RN    Sent: 01/21/2016   3:19 PM      To: Darlina Guys, Bethany [place], [place] Subject: new mask                                       New mask order placed  bethany- we will need a download 2 months after  Thanks!

## 2016-02-12 ENCOUNTER — Telehealth (HOSPITAL_COMMUNITY): Payer: Self-pay | Admitting: Vascular Surgery

## 2016-02-12 NOTE — Telephone Encounter (Signed)
Left pt message to make NP PULM HTN APPT

## 2016-02-16 ENCOUNTER — Encounter: Payer: Self-pay | Admitting: Cardiology

## 2016-02-16 NOTE — Telephone Encounter (Signed)
Patient is scheduled with the CHF Clinic 7/21.

## 2016-03-04 DIAGNOSIS — Y9342 Activity, yoga: Secondary | ICD-10-CM

## 2016-03-04 NOTE — Congregational Nurse Program (Signed)
Congregational Nurse Program Note  Date of Encounter: 03/04/2016  Past Medical History: No past medical history on file.  Encounter Details:     CNP Questionnaire - 03/04/16 1155    Patient Demographics   Is this a new or existing patient? Existing   Patient is considered a/an Not Applicable   Race African-American/Black   Patient Assistance   Location of Patient Assistance Not Applicable   Patient's financial/insurance status Medicare   Uninsured Patient No   Patient referred to apply for the following financial assistance Not Applicable   Food insecurities addressed Not Applicable   Transportation assistance No   Assistance securing medications No   Educational health offerings Exercise/physical activity   Encounter Details   Primary purpose of visit Spiritual Care/Support Visit   Was an Emergency Department visit averted? Not Applicable   Does patient have a medical provider? Yes   Patient referred to Not Applicable   Was a mental health screening completed? (GAINS tool) No   Does patient have dental issues? No   Does patient have vision issues? No   Does your patient have an abnormal blood pressure today? No   Since previous encounter, have you referred patient for abnormal blood pressure that resulted in a new diagnosis or medication change? No   Does your patient have an abnormal blood glucose today? No   Since previous encounter, have you referred patient for abnormal blood glucose that resulted in a new diagnosis or medication change? No   Was there a life-saving intervention made? No       Attended chair yoga class.

## 2016-03-09 ENCOUNTER — Encounter: Payer: Self-pay | Admitting: Internal Medicine

## 2016-03-19 ENCOUNTER — Ambulatory Visit (HOSPITAL_COMMUNITY)
Admission: RE | Admit: 2016-03-19 | Discharge: 2016-03-19 | Disposition: A | Discharge status: Home / Self Care | Payer: Medicare Other | Source: Ambulatory Visit | Attending: Cardiology | Admitting: Cardiology

## 2016-03-19 VITALS — BP 104/70 | HR 81 | Wt 161.0 lb

## 2016-03-19 DIAGNOSIS — Z7901 Long term (current) use of anticoagulants: Secondary | ICD-10-CM | POA: Diagnosis not present

## 2016-03-19 DIAGNOSIS — I429 Cardiomyopathy, unspecified: Secondary | ICD-10-CM | POA: Insufficient documentation

## 2016-03-19 DIAGNOSIS — E119 Type 2 diabetes mellitus without complications: Secondary | ICD-10-CM | POA: Diagnosis not present

## 2016-03-19 DIAGNOSIS — I480 Paroxysmal atrial fibrillation: Secondary | ICD-10-CM | POA: Diagnosis not present

## 2016-03-19 DIAGNOSIS — G4733 Obstructive sleep apnea (adult) (pediatric): Secondary | ICD-10-CM | POA: Insufficient documentation

## 2016-03-19 DIAGNOSIS — J45909 Unspecified asthma, uncomplicated: Secondary | ICD-10-CM | POA: Insufficient documentation

## 2016-03-19 DIAGNOSIS — K219 Gastro-esophageal reflux disease without esophagitis: Secondary | ICD-10-CM | POA: Insufficient documentation

## 2016-03-19 DIAGNOSIS — I272 Other secondary pulmonary hypertension: Secondary | ICD-10-CM | POA: Diagnosis not present

## 2016-03-19 DIAGNOSIS — Z87891 Personal history of nicotine dependence: Secondary | ICD-10-CM | POA: Insufficient documentation

## 2016-03-19 DIAGNOSIS — I442 Atrioventricular block, complete: Secondary | ICD-10-CM | POA: Insufficient documentation

## 2016-03-19 DIAGNOSIS — Z950 Presence of cardiac pacemaker: Secondary | ICD-10-CM | POA: Diagnosis not present

## 2016-03-19 DIAGNOSIS — I5022 Chronic systolic (congestive) heart failure: Secondary | ICD-10-CM | POA: Diagnosis present

## 2016-03-19 DIAGNOSIS — Z8673 Personal history of transient ischemic attack (TIA), and cerebral infarction without residual deficits: Secondary | ICD-10-CM | POA: Insufficient documentation

## 2016-03-19 DIAGNOSIS — R0602 Shortness of breath: Secondary | ICD-10-CM | POA: Insufficient documentation

## 2016-03-19 MED ORDER — SPIRONOLACTONE 25 MG PO TABS: 12.5000 mg | ORAL_TABLET | Freq: Every day | ORAL | Status: DC

## 2016-03-19 MED ORDER — CARVEDILOL 6.25 MG PO TABS: 6.2500 mg | ORAL_TABLET | Freq: Two times a day (BID) | ORAL | Status: DC

## 2016-03-19 NOTE — Patient Instructions (Signed)
Increase  Carvedilol to 6.25 mg Twice daily   Start Spironolactone 12.5 mg (1/2 tab) daily at bedtime  Labs in 1 week  You have been referred to Cardiac Rehab, they will contact you to schedule  Your physician recommends that you schedule a follow-up appointment in: 1 month

## 2016-03-21 NOTE — Progress Notes (Signed)
Patient ID: Kim Mullins, female   DOB: 08-Mar-1930, 82 y.o.   MRN: [id number] PCP: Dr. Ayesha Rumpf Cardiology: Dr. Radford Pax HF Cardiology: Dr. Aundra Dubin  82 yo with history of chronic systolic CHF/nonischemic cardiomyopathy, prior complete heart block, OSA, and paroxysmal atrial fibrillation presents for CHF clinic evaluation.  She was diagnosed with a cardiomyopathy back in 2015.  She had right and left heart cath in 12/15, showing no significant coronary disease.  EF was in the 30-35% range.  She was admitted in 2/16 with symptomatic bradycardia/complete heart block and had Medtronic CRT-P placed.  In 4/16, EF was back up to 50-55%.  However, repeat echo in 6/17 has shown EF down to 15-20% with RV dysfunction.    She is now on Lasix 40 mg bid (recently increased).  Breathing is better overall.  BP is soft, limits titration of BP-active meds.  SBP 90s-100s at home.  She uses a walker or a cane for balance.  Lives alone and does all ADLs.  She is short of breath when she had to walk "long distances" such as across the hospital this morning.  No dyspnea walking in her house and can get around stores.  Short of breath with steps and with pulling out her garbage can. No orthopnea/PND.  Using CPAP.  No chest pain.  No lightheadedness, syncope, or palpitations.    Optivol: Fluid index < threshold, impedance trending up, 100% BiV pacing, rare brief atrial fibrillation.   Labs (3/17): HCT 36.9 Labs (5/17): K 4.2, creatinine 0.98  PMH: 1. Type II diabetes: Diet-controlled.  2. GERD: s/p esophageal dilatation.  3. Chronic systolic CHF: 1st noted in 2015. Nonischemic cardiomyopathy.  - Echo (11/15) with EF 40-45%.  - LHC/RHC (12/15): Normal coronaries, EF 30-35%, mean RA 9, PA 44/16 mean 31, unable to obtain PCWP, CI 2.5.  - Developed CHB and had Medtronic CRT-P placed in 2/16. - Echo (4/16) with EF 50-55%.  - Echo (6/17): EF 15-20%, moderate LVh, restrictive diastolic function, mild MR, RV moderately dilated  and moderately decreased in function, moderate TR, PASP 46 mmHg.  4. Complete heart block: Medtronic CRT-P in 2/16.  5. OSA: Uses CPAP 6. Asthma 7. Atrial fibrillation: Paroxysmal.  8. 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   FH: No cardiac problems that she knows of.   ROS: All systems reviewed and negative except as per HPI.   Current Outpatient Prescriptions  Medication Sig Dispense Refill  . Acetaminophen (TYLENOL) 325 MG CAPS Take 1 tablet by mouth daily as needed (pain).     Marland Kitchen apixaban (ELIQUIS) 2.5 MG TABS tablet Take 2.5 mg by mouth 2 (two) times daily.    . Ascorbic Acid (VITAMIN C PO) Take 1 tablet by mouth daily. Reported on 12/19/2015    . Bisacodyl (LAXATIVE PO) Take by mouth as directed.    Marland Kitchen CALCIUM PO Take 1 tablet by mouth daily.    . carvedilol (COREG) 6.25 MG tablet Take 1 tablet (6.25 mg total) by mouth 2 (two) times daily with a meal. 60 tablet 6  . diclofenac sodium (VOLTAREN) 1 % GEL Apply 1 application topically 3 (three) times daily as needed (pain, inflammation).   0  . diphenhydramine-acetaminophen (TYLENOL PM) 25-500 MG TABS tablet Take 1 tablet by mouth at bedtime as needed (sleep).     . dorzolamide (TRUSOPT) 2 % ophthalmic solution as directed.   11  . furosemide (LASIX)  40 MG tablet Take 1 tablet (40 mg total) by mouth 2 (two) times daily. 180 tablet 3  . lisinopril (PRINIVIL,ZESTRIL) 10 MG tablet Take 1 tablet (10 mg total) by mouth daily. 30 tablet 11  . timolol (TIMOPTIC) 0.5 % ophthalmic solution as directed.   11  . spironolactone (ALDACTONE) 25 MG tablet Take 0.5 tablets (12.5 mg total) by mouth at bedtime. 15 tablet 6   No current facility-administered medications for this encounter.    BP 104/70   Pulse 81   Wt 161 lb (73 kg)   SpO2 97%   BMI 28.52 kg/m  General: NAD Neck: No JVD, no thyromegaly or thyroid nodule.  Lungs: Clear to auscultation bilaterally  with normal respiratory effort. CV: Nondisplaced PMI.  Heart regular S1/S2, no S3/S4, no murmur.  No peripheral edema.  No carotid bruit.  Normal pedal pulses.  Abdomen: Soft, nontender, no hepatosplenomegaly, no distention.  Skin: Intact without lesions or rashes.  Neurologic: Alert and oriented x 3.  Psych: Normal affect. Extremities: No clubbing or cyanosis.  HEENT: Normal.   Assessment/Plan: 1. Chronic systolic CHF: Nonischemic cardiomyopathy, no coronary disease on cath in 12/15. Etiology uncertain => ?viral myocarditis.  She does not remember a family history of cardiomyopathy and has never drunk ETOH heavily.  LV function initially improved in 2016, but EF back down to 15-20% with RV dysfunction on 6/17 echo.  She had had more dyspnea for several months, but Lasix was increased recently and she feels better. No chest pain.  NYHA class III currently.  She is not volume overloaded on exam or by Optivol.  She is BiV pacing near 100%.  - Increase Coreg to 6.25 mg bid. - Add spironolactone 12.5 mg qhs - Continue current lisinopril, could consider transition to [facility] in future.  - BMET/BNP in 1 week.  - Will also check SPEP/UPEP.  - Has CRT device, cannot get MRI.  - Refer to cardiac rehab.  2. Complete heart block: Medtronic CRT-P.  3. Atrial fibrillation: Paroxysmal, in NSR today, rare episodes based on Optivol.  She has history of CVA, CHADSVASC = 6.  Continue apixaban.  4. OSA: Uses CPAP.   [name] 03/21/2016

## 2016-03-22 ENCOUNTER — Encounter: Payer: Medicare Other | Admitting: *Deleted

## 2016-03-22 ENCOUNTER — Telehealth: Payer: Self-pay | Admitting: Cardiology

## 2016-03-22 NOTE — Telephone Encounter (Signed)
LMOVM reminding pt to send remote transmission.   

## 2016-03-24 ENCOUNTER — Emergency Department (HOSPITAL_COMMUNITY): Payer: Medicare Other

## 2016-03-24 ENCOUNTER — Emergency Department (HOSPITAL_COMMUNITY)
Admission: EM | Admit: 2016-03-24 | Discharge: 2016-03-24 | Disposition: A | Discharge status: Home / Self Care | Payer: Medicare Other | Attending: Emergency Medicine | Admitting: Emergency Medicine

## 2016-03-24 ENCOUNTER — Encounter (HOSPITAL_COMMUNITY): Payer: Self-pay | Admitting: Emergency Medicine

## 2016-03-24 DIAGNOSIS — Z87891 Personal history of nicotine dependence: Secondary | ICD-10-CM | POA: Insufficient documentation

## 2016-03-24 DIAGNOSIS — E119 Type 2 diabetes mellitus without complications: Secondary | ICD-10-CM | POA: Insufficient documentation

## 2016-03-24 DIAGNOSIS — Z8673 Personal history of transient ischemic attack (TIA), and cerebral infarction without residual deficits: Secondary | ICD-10-CM | POA: Insufficient documentation

## 2016-03-24 DIAGNOSIS — N390 Urinary tract infection, site not specified: Secondary | ICD-10-CM | POA: Diagnosis not present

## 2016-03-24 DIAGNOSIS — J45909 Unspecified asthma, uncomplicated: Secondary | ICD-10-CM | POA: Insufficient documentation

## 2016-03-24 DIAGNOSIS — Z950 Presence of cardiac pacemaker: Secondary | ICD-10-CM | POA: Diagnosis not present

## 2016-03-24 DIAGNOSIS — Z96641 Presence of right artificial hip joint: Secondary | ICD-10-CM | POA: Diagnosis not present

## 2016-03-24 DIAGNOSIS — I5022 Chronic systolic (congestive) heart failure: Secondary | ICD-10-CM | POA: Insufficient documentation

## 2016-03-24 DIAGNOSIS — I110 Hypertensive heart disease with heart failure: Secondary | ICD-10-CM | POA: Insufficient documentation

## 2016-03-24 DIAGNOSIS — Z96611 Presence of right artificial shoulder joint: Secondary | ICD-10-CM | POA: Insufficient documentation

## 2016-03-24 DIAGNOSIS — R531 Weakness: Secondary | ICD-10-CM | POA: Diagnosis present

## 2016-03-24 DIAGNOSIS — Z7901 Long term (current) use of anticoagulants: Secondary | ICD-10-CM | POA: Insufficient documentation

## 2016-03-24 LAB — COMPREHENSIVE METABOLIC PANEL
ALT: 11 U/L — ABNORMAL LOW (ref 14–54)
AST: 22 U/L (ref 15–41)
Albumin: 3.4 g/dL — ABNORMAL LOW (ref 3.5–5.0)
Alkaline Phosphatase: 57 U/L (ref 38–126)
Anion gap: 7 (ref 5–15)
BUN: 26 mg/dL — ABNORMAL HIGH (ref 6–20)
CO2: 27 mmol/L (ref 22–32)
Calcium: 9.2 mg/dL (ref 8.9–10.3)
Chloride: 104 mmol/L (ref 101–111)
Creatinine, Ser: 1.11 mg/dL — ABNORMAL HIGH (ref 0.44–1.00)
GFR calc Af Amer: 52 mL/min — ABNORMAL LOW (ref >60)
GFR calc non Af Amer: 45 mL/min — ABNORMAL LOW (ref >60)
Glucose, Bld: 97 mg/dL (ref 65–99)
Potassium: 4.7 mmol/L (ref 3.5–5.1)
Sodium: 138 mmol/L (ref 135–145)
Total Bilirubin: 1 mg/dL (ref 0.3–1.2)
Total Protein: 7.9 g/dL (ref 6.5–8.1)

## 2016-03-24 LAB — CBC WITH DIFFERENTIAL/PLATELET
Basophils Absolute: 0 10*3/uL (ref 0.0–0.1)
Basophils Relative: 1 %
Eosinophils Absolute: 0.1 10*3/uL (ref 0.0–0.7)
Eosinophils Relative: 2 %
HCT: 37.8 % (ref 36.0–46.0)
Hemoglobin: 12 g/dL (ref 12.0–15.0)
Lymphocytes Relative: 34 %
Lymphs Abs: 1.6 10*3/uL (ref 0.7–4.0)
MCH: 28.3 pg (ref 26.0–34.0)
MCHC: 31.7 g/dL (ref 30.0–36.0)
MCV: 89.2 fL (ref 78.0–100.0)
Monocytes Absolute: 0.4 10*3/uL (ref 0.1–1.0)
Monocytes Relative: 8 %
Neutro Abs: 2.5 10*3/uL (ref 1.7–7.7)
Neutrophils Relative %: 55 %
Platelets: 267 10*3/uL (ref 150–400)
RBC: 4.24 MIL/uL (ref 3.87–5.11)
RDW: 14.3 % (ref 11.5–15.5)
WBC: 4.6 10*3/uL (ref 4.0–10.5)

## 2016-03-24 LAB — URINALYSIS, ROUTINE W REFLEX MICROSCOPIC
Bilirubin Urine: NEGATIVE
Glucose, UA: NEGATIVE mg/dL
Hgb urine dipstick: NEGATIVE
Ketones, ur: NEGATIVE mg/dL
Nitrite: NEGATIVE
Protein, ur: NEGATIVE mg/dL
Specific Gravity, Urine: 1.016 (ref 1.005–1.030)
pH: 6 (ref 5.0–8.0)

## 2016-03-24 LAB — URINE MICROSCOPIC-ADD ON: RBC / HPF: NONE SEEN RBC/hpf (ref 0–5)

## 2016-03-24 MED ORDER — SODIUM CHLORIDE 0.9 % IV BOLUS (SEPSIS)
500.0000 mL | Freq: Once | INTRAVENOUS | Status: AC
  Administered 2016-03-24: 500 mL via INTRAVENOUS

## 2016-03-24 NOTE — ED Notes (Signed)
Up to RR to void

## 2016-03-24 NOTE — Discharge Instructions (Signed)
The heart failure clinic will call you with an appointment.  Do not take your spironolactone tonight, but start with half tablet tomorrow night.  Return here as needed.  Increase her fluid intake, rest as much as possible

## 2016-03-24 NOTE — ED Notes (Signed)
Patient transported to X-ray 

## 2016-03-24 NOTE — ED Triage Notes (Signed)
Reports sudden onset of feeling weak and light headed.  Called EMS, walked to ambulance with assistance of cane.  Is NIDDM, glucose was 247 per ambulance.   Also had similar episode Sunday evening and ended up finding self "on my behind."

## 2016-03-24 NOTE — ED Notes (Signed)
Pt verbalized understanding of d/c instructions and has no further questions. Pt stable and NAD. Pt to follow up with heart failure clinic.

## 2016-03-25 ENCOUNTER — Telehealth: Payer: Self-pay | Admitting: Cardiology

## 2016-03-25 NOTE — Telephone Encounter (Signed)
New Message  Pt calling to see if lab work sched for tomorrow 7/28 is still necessary since she was just seen @ [facility] and had labs done there. Please call back and discuss.

## 2016-03-26 ENCOUNTER — Encounter: Payer: Self-pay | Admitting: Cardiology

## 2016-03-26 ENCOUNTER — Other Ambulatory Visit: Payer: Medicare Other | Admitting: *Deleted

## 2016-03-26 DIAGNOSIS — I272 Pulmonary hypertension, unspecified: Secondary | ICD-10-CM

## 2016-03-26 LAB — URINE CULTURE: Culture: >=100000 — AB

## 2016-03-26 LAB — BRAIN NATRIURETIC PEPTIDE: Brain Natriuretic Peptide: 304.3 pg/mL — ABNORMAL HIGH (ref <100)

## 2016-03-26 NOTE — ED Provider Notes (Signed)
[facility] DEPT Provider Note   CSN: [id number] Arrival date & time: 03/24/16  1204  First Provider Contact:  First MD Initiated Contact with Patient 03/24/16 1230        History   Chief Complaint Chief Complaint  Patient presents with  . Weakness    HPI Kim FREEHILL is a 82 y.o. female.  HPI Patient presents to the emergency department with generalized weakness and lightheadedness over the last few days, but had an episode today and states that she wanted to be evaluated.  The patient states she was started on spironolactone this past Friday and actually took a whole  A whole pill instead of a half.  Patient states her symptoms feel better at this time.  The patient denies taking any other new medications.  The patient states nothing seems make the condition better or worse.  She states on Sunday she did potentially pass out.  The patient denies chest pain, shortness of breath, headache,blurred vision, neck pain, fever, cough,numbness,anorexia, edema, abdominal pain, nausea, vomiting, diarrhea, rash, back pain, dysuria, hematemesis, bloody stool,  Past Medical History:  Diagnosis Date  . Anemia    occassionally  . Arthritis    all over.  . Asthma    Allergixc reaction to cats.  . Atrial fibrillation ([place])   . Bowel obstruction ([place])   . Chronic systolic CHF (congestive heart failure) (HCC)    EF 30-35% by cath, echo 2016 EF 50%  . Complete heart block (HCC)    a. s/p MDT CRTP pacemaker  . DCM (dilated cardiomyopathy) ([place]) 08/16/2014   normal coronary arteries on cath with EF 30-45%.  EF now 50% by echo 11/2014  . Diabetes mellitus 2006   Diet and exercise controlled.  Marland Kitchen GERD (gastroesophageal reflux disease)    occ  . Hypertension   . Left tibial fracture 2007  . OSA (obstructive sleep apnea) 10/23/2014   Moderate with AHI 21/hr  . Osteoarthritis of right shoulder region 06/26/2013  . Stroke [facility])     Patient Active Problem List   Diagnosis Date Noted  .  Atrial fibrillation ([place]) 09/12/2015  . Occipital infarction ([place])   . CVA (cerebral infarction) 06/18/2015  . Homonymous hemianopsia   . Pacemaker 01/21/2015  . OSA (obstructive sleep apnea) 10/23/2014  . Dizziness 10/21/2014  . Complete heart block ([place]) 10/06/2014  . Benign essential HTN 10/04/2014  . Chronic systolic CHF (congestive heart failure) ([place]) 10/04/2014  . Abnormal cardiovascular stress test 08/27/2014  . Pulmonary HTN ([place]) 08/16/2014  . DCM (dilated cardiomyopathy) ([place]) 08/16/2014  . Cough 07/13/2014  . Esophageal stricture 06/11/2014  . Nonspecific (abnormal) findings on radiological and other examination of gastrointestinal tract 05/29/2014  . Dyspnea 04/29/2014  . Osteoarthritis of right shoulder region 06/26/2013  . DJD (degenerative joint disease) of hip 11/16/2011    Class: Present on Admission    Past Surgical History:  Procedure Laterality Date  . ABDOMINAL HYSTERECTOMY  1969  . BI-VENTRICULAR PACEMAKER INSERTION N/A 10/07/2014   MDT CRTP implanted by Dr Lovena Le  . BRAVO [place] STUDY N/A 06/11/2014   Procedure: BRAVO [place] STUDY;  Surgeon: Inda Castle, MD;  Location: [facility] ENDOSCOPY;  Service: Endoscopy;  Laterality: N/A;  . BUNIONECTOMY  1984   Bilateral  . CARDIAC CATHETERIZATION     normal coronary arteries  . Carpal [place]  2004   Bilateral  . CATARACT EXTRACTION Right    early 2015  . corn removal  1999   Bilateral feet  .  DILATION AND CURETTAGE OF UTERUS  1968  . ESOPHAGOGASTRODUODENOSCOPY (EGD) WITH PROPOFOL N/A 06/11/2014   Procedure: ESOPHAGOGASTRODUODENOSCOPY (EGD) WITH PROPOFOL;  Surgeon: Inda Castle, MD;  Location: [facility] ENDOSCOPY;  Service: Endoscopy;  Laterality: N/A;  . [place]   Surgery to fix Retainal detachment, bilateral  . JOINT REPLACEMENT    . LEFT AND RIGHT HEART CATHETERIZATION WITH CORONARY ANGIOGRAM N/A 08/29/2014   Procedure: LEFT AND RIGHT HEART CATHETERIZATION WITH CORONARY ANGIOGRAM;  Surgeon: Peter M Martinique, MD;   Location: [facility] CATH LAB;  Service: Cardiovascular;  Laterality: N/A;  . MALONEY DILATION  06/11/2014   Procedure: Venia Minks DILATION;  Surgeon: Inda Castle, MD;  Location: [facility] ENDOSCOPY;  Service: Endoscopy;;  . PILONIDAL CYST EXCISION  1959  . TONSILLECTOMY  1942  . TOTAL HIP ARTHROPLASTY  11/16/2011   Procedure: TOTAL HIP ARTHROPLASTY ANTERIOR APPROACH;  Surgeon: Hessie Dibble, MD;  Location: [facility];  Service: Orthopedics;  Laterality: Right;  DEPUY  . TOTAL SHOULDER ARTHROPLASTY Right 06/26/2013   Procedure: TOTAL SHOULDER ARTHROPLASTY;  Surgeon: Johnny Bridge, MD;  Location: [facility];  Service: Orthopedics;  Laterality: Right;    OB History    No data available       Home Medications    Prior to Admission medications   Medication Sig Start Date End Date Taking? Authorizing Provider  Acetaminophen (TYLENOL) 325 MG CAPS Take 325 mg by mouth daily as needed (for pain).    Yes Historical Provider, MD  apixaban (ELIQUIS) 2.5 MG TABS tablet Take 2.5 mg by mouth 2 (two) times daily.   Yes Historical Provider, MD  Ascorbic Acid (VITAMIN C PO) Take 1 tablet by mouth daily. Reported on 12/19/2015   Yes Historical Provider, MD  Bisacodyl (LAXATIVE PO) Take 1 tablet by mouth daily as needed (for constipation).    Yes Historical Provider, MD  CALCIUM PO Take 1 tablet by mouth daily.   Yes Historical Provider, MD  carvedilol (COREG) 6.25 MG tablet Take 1 tablet (6.25 mg total) by mouth 2 (two) times daily with a meal. 03/19/16  Yes Larey Dresser, MD  diclofenac sodium (VOLTAREN) 1 % GEL Apply 1 application topically 3 (three) times daily as needed (pain, inflammation).  04/23/15  Yes Historical Provider, MD  diphenhydramine-acetaminophen (TYLENOL PM) 25-500 MG TABS tablet Take 1 tablet by mouth at bedtime as needed (sleep).    Yes Historical Provider, MD  dorzolamide (TRUSOPT) 2 % ophthalmic solution Place 1 drop into both eyes 2 (two) times daily.  07/30/15  Yes Historical Provider, MD    furosemide (LASIX) 40 MG tablet Take 1 tablet (40 mg total) by mouth 2 (two) times daily. 12/19/15  Yes Evans Lance, MD  lisinopril (PRINIVIL,ZESTRIL) 10 MG tablet Take 1 tablet (10 mg total) by mouth daily. 02/09/16  Yes Sueanne Margarita, MD  spironolactone (ALDACTONE) 25 MG tablet Take 0.5 tablets (12.5 mg total) by mouth at bedtime. 03/19/16  Yes Larey Dresser, MD  timolol (TIMOPTIC) 0.5 % ophthalmic solution Place 1 drop into both eyes daily.  07/29/15  Yes Historical Provider, MD    Family History Family History  Problem Relation Age of Onset  . Dementia Mother   . Coronary artery disease Mother   . Cancer Father     blood ? type  . Diabetes Maternal Grandfather   . Anuerysm Daughter     brain  . Colon cancer Neg Hx     Social History Social History  Substance Use  Topics  . Smoking status: Former Smoker    Packs/day: 1.00    Years: 45.00    Types: Cigarettes    Quit date: 07/24/1978  . Smokeless tobacco: Never Used  . Alcohol use 4.2 oz/week    7 Glasses of wine per week     Comment: every day     Allergies   Hydrocodone; Percocet [oxycodone-acetaminophen]; Eggs or egg-derived products; Mercurial derivatives; Penicillins; and Quinine derivatives   Review of Systems Review of Systems All other systems negative except as documented in the HPI. All pertinent positives and negatives as reviewed in the HPI.  Physical Exam Updated Vital Signs BP 129/90   Pulse 64   Temp 98.1 F (36.7 C) (Oral)   Resp 17   Wt 72.1 kg   SpO2 100%   BMI 28.17 kg/m   Physical Exam  Constitutional: She is oriented to person, place, and time. She appears well-developed and well-nourished. No distress.  HENT:  Head: Normocephalic and atraumatic.  Mouth/Throat: Oropharynx is clear and moist.  Eyes: Pupils are equal, round, and reactive to light.  Neck: Normal range of motion. Neck supple.  Cardiovascular: Normal rate, regular rhythm and normal heart sounds.  Exam reveals no  gallop and no friction rub.   No murmur heard. Pulmonary/Chest: Effort normal and breath sounds normal. No respiratory distress. She has no wheezes.  Abdominal: Soft. Bowel sounds are normal. She exhibits no distension. There is no tenderness.  Musculoskeletal: She exhibits no edema.  Neurological: She is alert and oriented to person, place, and time. She exhibits normal muscle tone. Coordination normal.  Skin: Skin is warm and dry. No rash noted. No erythema.  Psychiatric: She has a normal mood and affect. Her behavior is normal.  Nursing note and vitals reviewed.    ED Treatments / Results  Labs (all labs ordered are listed, but only abnormal results are displayed) Labs Reviewed  URINE CULTURE - Abnormal; Notable for the following:       Result Value   Culture   (*)    Value: >=100,000 COLONIES/mL ESCHERICHIA COLI Confirmed Extended Spectrum Beta-Lactamase Producer (ESBL)    Organism ID, Bacteria ESCHERICHIA COLI (*)    All other components within normal limits  COMPREHENSIVE METABOLIC PANEL - Abnormal; Notable for the following:    BUN 26 (*)    Creatinine, Ser 1.11 (*)    Albumin 3.4 (*)    ALT 11 (*)    GFR calc non Af Amer 45 (*)    GFR calc Af Amer 52 (*)    All other components within normal limits  URINALYSIS, ROUTINE W REFLEX MICROSCOPIC (NOT AT [facility]) - Abnormal; Notable for the following:    Leukocytes, UA MODERATE (*)    All other components within normal limits  URINE MICROSCOPIC-ADD ON - Abnormal; Notable for the following:    Squamous Epithelial / LPF 0-5 (*)    Bacteria, UA FEW (*)    All other components within normal limits  CBC WITH DIFFERENTIAL/PLATELET    EKG  EKG Interpretation  Date/Time:  Wednesday March 24 2016 12:04:56 EDT Ventricular Rate:  66 PR Interval:    QRS Duration: 128 QT Interval:  477 QTC Calculation: 500 R Axis:   94 Text Interpretation:  Sinus rhythm Multiple ventricular premature complexes RBBB and LPFB Anterolateral infarct,  age indeterminate No significant change since last tracing Confirmed by YAO  MD, DAVID ([id number]) on 03/24/2016 1:13:07 PM       Radiology No results found.  Procedures Procedures (including critical care time)  Medications Ordered in ED Medications  sodium chloride 0.9 % bolus 500 mL (0 mLs Intravenous Stopped 03/24/16 1512)     Initial Impression / Assessment and Plan / ED Course  I have reviewed the triage vital signs and the nursing notes.  Pertinent labs & imaging results that were available during my care of the patient were reviewed by me and considered in my medical decision making (see chart for details).  Clinical Course    I spoke with the heart failure clinic about the spironolactone and they felt that she could hold this tonight and then start taking a half tomorrow evening and they will follow her up in the clinic this week.  Patient is feeling better following IV fluids.  Told to return here as needed.  Patient agrees the plan and all questions were answered  Final Clinical Impressions(s) / ED Diagnoses   Final diagnoses:  Weakness  UTI (lower urinary tract infection)    New Prescriptions Discharge Medication List as of 03/24/2016  4:15 PM       Dalia Heading, PA-C 03/26/16 [place] Yao, MD 03/26/16 2102

## 2016-03-26 NOTE — Telephone Encounter (Signed)
Called pt and left message that pt still needs to come in for lab appointment. These are different labs from what she had in the ED.

## 2016-03-27 ENCOUNTER — Telehealth (HOSPITAL_BASED_OUTPATIENT_CLINIC_OR_DEPARTMENT_OTHER): Payer: Self-pay

## 2016-03-29 ENCOUNTER — Encounter (HOSPITAL_COMMUNITY): Payer: Self-pay

## 2016-03-29 ENCOUNTER — Ambulatory Visit (HOSPITAL_COMMUNITY)
Admit: 2016-03-29 | Discharge: 2016-03-29 | Disposition: A | Discharge status: Home / Self Care | Payer: Medicare Other | Source: Ambulatory Visit | Attending: Cardiology | Admitting: Cardiology

## 2016-03-29 VITALS — BP 90/54 | HR 75 | Wt 160.5 lb

## 2016-03-29 DIAGNOSIS — Z87891 Personal history of nicotine dependence: Secondary | ICD-10-CM | POA: Diagnosis not present

## 2016-03-29 DIAGNOSIS — E119 Type 2 diabetes mellitus without complications: Secondary | ICD-10-CM | POA: Insufficient documentation

## 2016-03-29 DIAGNOSIS — Z79899 Other long term (current) drug therapy: Secondary | ICD-10-CM | POA: Diagnosis not present

## 2016-03-29 DIAGNOSIS — G4733 Obstructive sleep apnea (adult) (pediatric): Secondary | ICD-10-CM | POA: Diagnosis not present

## 2016-03-29 DIAGNOSIS — Z7901 Long term (current) use of anticoagulants: Secondary | ICD-10-CM | POA: Diagnosis not present

## 2016-03-29 DIAGNOSIS — I428 Other cardiomyopathies: Secondary | ICD-10-CM | POA: Diagnosis not present

## 2016-03-29 DIAGNOSIS — K219 Gastro-esophageal reflux disease without esophagitis: Secondary | ICD-10-CM | POA: Insufficient documentation

## 2016-03-29 DIAGNOSIS — I442 Atrioventricular block, complete: Secondary | ICD-10-CM | POA: Insufficient documentation

## 2016-03-29 DIAGNOSIS — Z950 Presence of cardiac pacemaker: Secondary | ICD-10-CM | POA: Diagnosis not present

## 2016-03-29 DIAGNOSIS — Z8673 Personal history of transient ischemic attack (TIA), and cerebral infarction without residual deficits: Secondary | ICD-10-CM | POA: Insufficient documentation

## 2016-03-29 DIAGNOSIS — I480 Paroxysmal atrial fibrillation: Secondary | ICD-10-CM | POA: Diagnosis not present

## 2016-03-29 DIAGNOSIS — I5022 Chronic systolic (congestive) heart failure: Secondary | ICD-10-CM

## 2016-03-29 MED ORDER — FUROSEMIDE 40 MG PO TABS: ORAL_TABLET | ORAL | 3 refills | Status: DC

## 2016-03-29 NOTE — Patient Instructions (Signed)
Decrease Lasix to 40mg  (1 tablet) in the AM and 20mg  (1/2 tablet) in the PM  Your provider has referred you to Cardiac Rehab.  They will call you to schedule an appointment.  Follow up with Dr.McLean in 2 months

## 2016-03-29 NOTE — Progress Notes (Signed)
Patient ID: Kim Mullins, female   DOB: [birth year], 82 y.o.   MRN: [id number] PCP: Dr. Ayesha Rumpf Cardiology: Dr. Radford Pax HF Cardiology: Dr. Aundra Dubin  82 yo with history of chronic systolic CHF/nonischemic cardiomyopathy, prior complete heart block, OSA, and paroxysmal atrial fibrillation presents for CHF clinic evaluation.  She was diagnosed with a cardiomyopathy back in 2015.  She had right and left heart cath in 12/15, showing no significant coronary disease.  EF was in the 30-35% range.  She was admitted in 2/16 with symptomatic bradycardia/complete heart block and had Medtronic CRT-P placed.  In 4/16, EF was back up to 50-55%.  However, repeat echo in 6/17 has shown EF down to 15-20% with RV dysfunction.    She is now on Lasix 40 mg bid.  Breathing is better overall.  BP is soft, limits titration of BP-active meds.  SBP 90s-100s at home.  She uses a walker or a cane for balance, she is short of breath after walking about 50 feet.  Lives alone and does all ADLs.  Short of breath with steps and with pulling out her garbage can. No orthopnea/PND.  Using CPAP.  No chest pain.  No lightheadedness, syncope, or palpitations.  No melena/BRBPR. Weight is down 1 lb. After last appointment, she started taking spironolactone 25 mg daily rather than 12.5 mg daily.  She went to the ER because she was feeling lightheaded.  She was told to decrease spironolactone down to 12.5 daily.  She has not had any further lightheadedness since then.   Optivol: Fluid index < threshold, impedance stable, 100% BiV pacing, no atrial fibrillation.   Labs (3/17): HCT 36.9 Labs (5/17): K 4.2, creatinine 0.98 Labs (7/17): K 4.7, creatinine 1.11, BNP 304  PMH: 1. Type II diabetes: Diet-controlled.  2. GERD: s/p esophageal dilatation.  3. Chronic systolic CHF: 1st noted in 2015. Nonischemic cardiomyopathy.  - Echo (11/15) with EF 40-45%.  - LHC/RHC (12/15): Normal coronaries, EF 30-35%, mean RA 9, PA 44/16 mean 31, unable to  obtain PCWP, CI 2.5.  - Developed CHB and had Medtronic CRT-P placed in 2/16. - Echo (4/16) with EF 50-55%.  - Echo (6/17): EF 15-20%, moderate LVh, restrictive diastolic function, mild MR, RV moderately dilated and moderately decreased in function, moderate TR, PASP 46 mmHg.  4. Complete heart block: Medtronic CRT-P in 2/16.  5. OSA: Uses CPAP 6. Asthma 7. Atrial fibrillation: Paroxysmal.  8. 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   FH: No cardiac problems that she knows of.   ROS: All systems reviewed and negative except as per HPI.   Current Outpatient Prescriptions  Medication Sig Dispense Refill  . apixaban (ELIQUIS) 2.5 MG TABS tablet Take 2.5 mg by mouth 2 (two) times daily.    . Ascorbic Acid (VITAMIN C PO) Take 1 tablet by mouth daily. Reported on 12/19/2015    . Bisacodyl (LAXATIVE PO) Take 1 tablet by mouth daily as needed (for constipation).     . CALCIUM PO Take 1 tablet by mouth daily.    . carvedilol (COREG) 6.25 MG tablet Take 1 tablet (6.25 mg total) by mouth 2 (two) times daily with a meal. 60 tablet 6  . diclofenac sodium (VOLTAREN) 1 % GEL Apply 1 application topically 3 (three) times daily as needed (pain, inflammation).   0  . diphenhydramine-acetaminophen (TYLENOL PM) 25-500 MG TABS tablet Take 1 tablet by mouth at bedtime  as needed (sleep).     . dorzolamide (TRUSOPT) 2 % ophthalmic solution Place 1 drop into both eyes 2 (two) times daily.   11  . furosemide (LASIX) 40 MG tablet Take 40mg  (1 tablet) in the AM and 20mg  (1/2 tablet) in the PM 135 tablet 3  . lisinopril (PRINIVIL,ZESTRIL) 10 MG tablet Take 1 tablet (10 mg total) by mouth daily. 30 tablet 11  . spironolactone (ALDACTONE) 25 MG tablet Take 0.5 tablets (12.5 mg total) by mouth at bedtime. 15 tablet 6  . timolol (TIMOPTIC) 0.5 % ophthalmic solution Place 1 drop into both eyes daily.   11  . Acetaminophen (TYLENOL) 325 MG  CAPS Take 325 mg by mouth daily as needed (for pain).      No current facility-administered medications for this encounter.    BP (!) 90/54   Pulse 75   Wt 160 lb 8 oz (72.8 kg)   SpO2 98%   BMI 28.43 kg/m  General: NAD Neck: No JVD, no thyromegaly or thyroid nodule.  Lungs: Clear to auscultation bilaterally with normal respiratory effort. CV: Nondisplaced PMI.  Heart regular S1/S2, no S3/S4, no murmur.  No peripheral edema.  No carotid bruit.  Normal pedal pulses.  Abdomen: Soft, nontender, no hepatosplenomegaly, no distention.  Skin: Intact without lesions or rashes.  Neurologic: Alert and oriented x 3.  Psych: Normal affect. Extremities: No clubbing or cyanosis.  HEENT: Normal.   Assessment/Plan: 1. Chronic systolic CHF: Nonischemic cardiomyopathy, no coronary disease on cath in 12/15. Etiology uncertain => ?viral myocarditis.  She does not remember a family history of cardiomyopathy and has never drunk ETOH heavily.  LV function initially improved in 2016, but EF back down to 15-20% with RV dysfunction on 6/17 echo.  She had had more dyspnea for several months, but Lasix was increased and she feels better. No chest pain.  NYHA class III currently.  She is not volume overloaded on exam or by Optivol.  She is BiV pacing near 100%.  - Continue Coreg 6.25 mg bid, no BP room to titrate up. - Continue spironolactone 12.5 daily, had to decrease recently from 25 to 12.5 due to lightheadedness.  - Continue current lisinopril, could consider transition to [facility] in future if BP stays stable.  - I think that she can decrease Lasix to 40 qam/20 qpm.  - Will also check SPEP/UPEP (pending).   - Has CRT device, cannot get MRI.  - Refer to cardiac rehab.  2. Complete heart block: Medtronic CRT-P.  3. Atrial fibrillation: Paroxysmal, in NSR today.  She has history of CVA, CHADSVASC = 6.  Continue apixaban.  4. OSA: Uses CPAP.   [name] 03/29/2016

## 2016-03-30 LAB — PROTEIN ELECTROPHORESIS, SERUM
ALBUMIN ELP: 3.4 g/dL — AB (ref 3.8–4.8)
ALPHA-1-GLOBULIN: 0.4 g/dL — AB (ref 0.2–0.3)
ALPHA-2-GLOBULIN: 1.1 g/dL — AB (ref 0.5–0.9)
Abnormal Protein Band1: 0.6 g/dL
BETA GLOBULIN: 0.3 g/dL — AB (ref 0.4–0.6)
Beta 2: 0.5 g/dL (ref 0.2–0.5)
Gamma Globulin: 1.9 g/dL — ABNORMAL HIGH (ref 0.8–1.7)
Total Protein, Serum Electrophoresis: 7.5 g/dL (ref 6.1–8.1)

## 2016-04-12 ENCOUNTER — Inpatient Hospital Stay (HOSPITAL_COMMUNITY)
Admission: EM | Admit: 2016-04-12 | Discharge: 2016-04-16 | DRG: 069 | Disposition: A | Discharge status: Home / Self Care | Payer: Medicare Other | Attending: Family Medicine | Admitting: Family Medicine

## 2016-04-12 ENCOUNTER — Encounter (HOSPITAL_COMMUNITY): Payer: Self-pay | Admitting: Internal Medicine

## 2016-04-12 ENCOUNTER — Telehealth: Payer: Self-pay | Admitting: Neurology

## 2016-04-12 ENCOUNTER — Emergency Department (HOSPITAL_COMMUNITY): Payer: Medicare Other

## 2016-04-12 DIAGNOSIS — I10 Essential (primary) hypertension: Secondary | ICD-10-CM | POA: Diagnosis not present

## 2016-04-12 DIAGNOSIS — N179 Acute kidney failure, unspecified: Secondary | ICD-10-CM | POA: Diagnosis present

## 2016-04-12 DIAGNOSIS — M19011 Primary osteoarthritis, right shoulder: Secondary | ICD-10-CM | POA: Diagnosis present

## 2016-04-12 DIAGNOSIS — Z79899 Other long term (current) drug therapy: Secondary | ICD-10-CM

## 2016-04-12 DIAGNOSIS — I5023 Acute on chronic systolic (congestive) heart failure: Secondary | ICD-10-CM | POA: Diagnosis present

## 2016-04-12 DIAGNOSIS — G458 Other transient cerebral ischemic attacks and related syndromes: Principal | ICD-10-CM | POA: Diagnosis present

## 2016-04-12 DIAGNOSIS — Z87891 Personal history of nicotine dependence: Secondary | ICD-10-CM

## 2016-04-12 DIAGNOSIS — Z950 Presence of cardiac pacemaker: Secondary | ICD-10-CM

## 2016-04-12 DIAGNOSIS — G459 Transient cerebral ischemic attack, unspecified: Secondary | ICD-10-CM | POA: Diagnosis present

## 2016-04-12 DIAGNOSIS — E119 Type 2 diabetes mellitus without complications: Secondary | ICD-10-CM | POA: Diagnosis present

## 2016-04-12 DIAGNOSIS — Z961 Presence of intraocular lens: Secondary | ICD-10-CM | POA: Diagnosis present

## 2016-04-12 DIAGNOSIS — T50996A Underdosing of other drugs, medicaments and biological substances, initial encounter: Secondary | ICD-10-CM | POA: Diagnosis present

## 2016-04-12 DIAGNOSIS — Z8673 Personal history of transient ischemic attack (TIA), and cerebral infarction without residual deficits: Secondary | ICD-10-CM | POA: Diagnosis present

## 2016-04-12 DIAGNOSIS — I272 Other secondary pulmonary hypertension: Secondary | ICD-10-CM | POA: Diagnosis present

## 2016-04-12 DIAGNOSIS — G4733 Obstructive sleep apnea (adult) (pediatric): Secondary | ICD-10-CM | POA: Diagnosis present

## 2016-04-12 DIAGNOSIS — Z9841 Cataract extraction status, right eye: Secondary | ICD-10-CM

## 2016-04-12 DIAGNOSIS — I5022 Chronic systolic (congestive) heart failure: Secondary | ICD-10-CM | POA: Diagnosis present

## 2016-04-12 DIAGNOSIS — Z96641 Presence of right artificial hip joint: Secondary | ICD-10-CM | POA: Diagnosis present

## 2016-04-12 DIAGNOSIS — I480 Paroxysmal atrial fibrillation: Secondary | ICD-10-CM | POA: Diagnosis present

## 2016-04-12 DIAGNOSIS — I420 Dilated cardiomyopathy: Secondary | ICD-10-CM | POA: Diagnosis present

## 2016-04-12 DIAGNOSIS — T502X5A Adverse effect of carbonic-anhydrase inhibitors, benzothiadiazides and other diuretics, initial encounter: Secondary | ICD-10-CM | POA: Diagnosis present

## 2016-04-12 DIAGNOSIS — I959 Hypotension, unspecified: Secondary | ICD-10-CM | POA: Diagnosis present

## 2016-04-12 DIAGNOSIS — Z7901 Long term (current) use of anticoagulants: Secondary | ICD-10-CM

## 2016-04-12 DIAGNOSIS — I639 Cerebral infarction, unspecified: Secondary | ICD-10-CM

## 2016-04-12 DIAGNOSIS — Z96611 Presence of right artificial shoulder joint: Secondary | ICD-10-CM | POA: Diagnosis present

## 2016-04-12 DIAGNOSIS — R0600 Dyspnea, unspecified: Secondary | ICD-10-CM | POA: Diagnosis present

## 2016-04-12 DIAGNOSIS — I110 Hypertensive heart disease with heart failure: Secondary | ICD-10-CM | POA: Diagnosis present

## 2016-04-12 DIAGNOSIS — H547 Unspecified visual loss: Secondary | ICD-10-CM

## 2016-04-12 DIAGNOSIS — Z90710 Acquired absence of both cervix and uterus: Secondary | ICD-10-CM

## 2016-04-12 DIAGNOSIS — I952 Hypotension due to drugs: Secondary | ICD-10-CM | POA: Diagnosis present

## 2016-04-12 DIAGNOSIS — I442 Atrioventricular block, complete: Secondary | ICD-10-CM | POA: Diagnosis present

## 2016-04-12 DIAGNOSIS — E860 Dehydration: Secondary | ICD-10-CM | POA: Diagnosis present

## 2016-04-12 DIAGNOSIS — K219 Gastro-esophageal reflux disease without esophagitis: Secondary | ICD-10-CM | POA: Diagnosis present

## 2016-04-12 DIAGNOSIS — H538 Other visual disturbances: Secondary | ICD-10-CM | POA: Diagnosis present

## 2016-04-12 DIAGNOSIS — I4891 Unspecified atrial fibrillation: Secondary | ICD-10-CM | POA: Diagnosis present

## 2016-04-12 DIAGNOSIS — N390 Urinary tract infection, site not specified: Secondary | ICD-10-CM | POA: Diagnosis present

## 2016-04-12 LAB — COMPREHENSIVE METABOLIC PANEL
ALBUMIN: 3.4 g/dL — AB (ref 3.5–5.0)
ALK PHOS: 54 U/L (ref 38–126)
ALT: 10 U/L — ABNORMAL LOW (ref 14–54)
ANION GAP: 7 (ref 5–15)
AST: 21 U/L (ref 15–41)
BILIRUBIN TOTAL: 1.1 mg/dL (ref 0.3–1.2)
BUN: 17 mg/dL (ref 6–20)
CALCIUM: 9 mg/dL (ref 8.9–10.3)
CO2: 21 mmol/L — AB (ref 22–32)
Chloride: 107 mmol/L (ref 101–111)
Creatinine, Ser: 1.43 mg/dL — ABNORMAL HIGH (ref 0.44–1.00)
GFR calc non Af Amer: 33 mL/min — ABNORMAL LOW (ref >60)
GFR, EST AFRICAN AMERICAN: 38 mL/min — AB (ref >60)
GLUCOSE: 100 mg/dL — AB (ref 65–99)
POTASSIUM: 4.2 mmol/L (ref 3.5–5.1)
SODIUM: 135 mmol/L (ref 135–145)
TOTAL PROTEIN: 7.2 g/dL (ref 6.5–8.1)

## 2016-04-12 LAB — DIFFERENTIAL
Basophils Absolute: 0 10*3/uL (ref 0.0–0.1)
Basophils Relative: 1 %
EOS ABS: 0.1 10*3/uL (ref 0.0–0.7)
EOS PCT: 2 %
Lymphocytes Relative: 38 %
Lymphs Abs: 1.6 10*3/uL (ref 0.7–4.0)
MONO ABS: 0.3 10*3/uL (ref 0.1–1.0)
Monocytes Relative: 7 %
NEUTROS PCT: 52 %
Neutro Abs: 2.2 10*3/uL (ref 1.7–7.7)

## 2016-04-12 LAB — RAPID URINE DRUG SCREEN, HOSP PERFORMED
Amphetamines: NOT DETECTED
BENZODIAZEPINES: NOT DETECTED
Barbiturates: NOT DETECTED
COCAINE: NOT DETECTED
Opiates: NOT DETECTED
Tetrahydrocannabinol: NOT DETECTED

## 2016-04-12 LAB — URINE MICROSCOPIC-ADD ON
BACTERIA UA: NONE SEEN
RBC / HPF: NONE SEEN RBC/hpf (ref 0–5)

## 2016-04-12 LAB — PROTIME-INR
INR: 1.28
PROTHROMBIN TIME: 16.1 s — AB (ref 11.4–15.2)

## 2016-04-12 LAB — I-STAT CHEM 8, ED
BUN: 18 mg/dL (ref 6–20)
CALCIUM ION: 1.13 mmol/L (ref 1.12–1.23)
Chloride: 105 mmol/L (ref 101–111)
Creatinine, Ser: 1.4 mg/dL — ABNORMAL HIGH (ref 0.44–1.00)
Glucose, Bld: 96 mg/dL (ref 65–99)
HEMATOCRIT: 36 % (ref 36.0–46.0)
HEMOGLOBIN: 12.2 g/dL (ref 12.0–15.0)
Potassium: 4.2 mmol/L (ref 3.5–5.1)
SODIUM: 138 mmol/L (ref 135–145)
TCO2: 20 mmol/L (ref 0–100)

## 2016-04-12 LAB — CBC
HCT: 35.3 % — ABNORMAL LOW (ref 36.0–46.0)
Hemoglobin: 11.2 g/dL — ABNORMAL LOW (ref 12.0–15.0)
MCH: 28 pg (ref 26.0–34.0)
MCHC: 31.7 g/dL (ref 30.0–36.0)
MCV: 88.3 fL (ref 78.0–100.0)
PLATELETS: 268 10*3/uL (ref 150–400)
RBC: 4 MIL/uL (ref 3.87–5.11)
RDW: 13.8 % (ref 11.5–15.5)
WBC: 4.2 10*3/uL (ref 4.0–10.5)

## 2016-04-12 LAB — URINALYSIS, ROUTINE W REFLEX MICROSCOPIC
Bilirubin Urine: NEGATIVE
GLUCOSE, UA: NEGATIVE mg/dL
HGB URINE DIPSTICK: NEGATIVE
Ketones, ur: NEGATIVE mg/dL
Nitrite: NEGATIVE
Protein, ur: NEGATIVE mg/dL
pH: 6.5 (ref 5.0–8.0)

## 2016-04-12 LAB — I-STAT TROPONIN, ED: Troponin i, poc: 0.12 ng/mL (ref 0.00–0.08)

## 2016-04-12 LAB — APTT: aPTT: 37 seconds — ABNORMAL HIGH (ref 24–36)

## 2016-04-12 LAB — POC OCCULT BLOOD, ED: Fecal Occult Bld: NEGATIVE

## 2016-04-12 LAB — TROPONIN I: TROPONIN I: 0.11 ng/mL — AB (ref <0.03)

## 2016-04-12 LAB — ETHANOL

## 2016-04-12 LAB — CBG MONITORING, ED: GLUCOSE-CAPILLARY: 102 mg/dL — AB (ref 65–99)

## 2016-04-12 MED ORDER — SODIUM CHLORIDE 0.9 % IV SOLN
INTRAVENOUS | Status: DC
  Administered 2016-04-12 – 2016-04-13 (×2): via INTRAVENOUS

## 2016-04-12 MED ORDER — DORZOLAMIDE HCL 2 % OP SOLN
1.0000 [drp] | Freq: Two times a day (BID) | OPHTHALMIC | Status: DC
  Administered 2016-04-12 – 2016-04-16 (×8): 1 [drp] via OPHTHALMIC
  Filled 2016-04-12: qty 10

## 2016-04-12 MED ORDER — ACETAMINOPHEN 325 MG PO TABS: 650.0000 mg | ORAL_TABLET | Freq: Four times a day (QID) | ORAL | Status: DC | PRN

## 2016-04-12 MED ORDER — SODIUM CHLORIDE 0.9 % IV BOLUS (SEPSIS)
500.0000 mL | Freq: Once | INTRAVENOUS | Status: AC
  Administered 2016-04-12: 500 mL via INTRAVENOUS

## 2016-04-12 MED ORDER — LISINOPRIL 10 MG PO TABS: 10.0000 mg | ORAL_TABLET | Freq: Every day | ORAL | Status: DC

## 2016-04-12 MED ORDER — ONDANSETRON HCL 4 MG/2ML IJ SOLN: 4.0000 mg | Freq: Four times a day (QID) | INTRAMUSCULAR | Status: DC | PRN

## 2016-04-12 MED ORDER — DICLOFENAC SODIUM 1 % TD GEL: 1.0000 "application " | Freq: Three times a day (TID) | TRANSDERMAL | Status: DC | PRN

## 2016-04-12 MED ORDER — FUROSEMIDE 20 MG PO TABS: 40.0000 mg | ORAL_TABLET | Freq: Every day | ORAL | Status: DC

## 2016-04-12 MED ORDER — FUROSEMIDE 20 MG PO TABS: 20.0000 mg | ORAL_TABLET | Freq: Every day | ORAL | Status: DC

## 2016-04-12 MED ORDER — SODIUM CHLORIDE 0.9 % IV BOLUS (SEPSIS)
1000.0000 mL | Freq: Once | INTRAVENOUS | Status: AC
  Administered 2016-04-12: 1000 mL via INTRAVENOUS

## 2016-04-12 MED ORDER — SPIRONOLACTONE 25 MG PO TABS: 12.5000 mg | ORAL_TABLET | Freq: Every day | ORAL | Status: DC

## 2016-04-12 MED ORDER — STROKE: EARLY STAGES OF RECOVERY BOOK
Freq: Once | Status: AC
  Administered 2016-04-13: 01:00:00

## 2016-04-12 MED ORDER — CIPROFLOXACIN IN D5W 400 MG/200ML IV SOLN
400.0000 mg | Freq: Two times a day (BID) | INTRAVENOUS | Status: DC
  Filled 2016-04-12: qty 200

## 2016-04-12 MED ORDER — APIXABAN 2.5 MG PO TABS
2.5000 mg | ORAL_TABLET | Freq: Two times a day (BID) | ORAL | Status: DC
  Administered 2016-04-13: 2.5 mg via ORAL
  Filled 2016-04-12 (×2): qty 1

## 2016-04-12 MED ORDER — CIPROFLOXACIN IN D5W 400 MG/200ML IV SOLN
400.0000 mg | INTRAVENOUS | Status: DC
  Administered 2016-04-13: 400 mg via INTRAVENOUS
  Filled 2016-04-12: qty 200

## 2016-04-12 MED ORDER — SENNOSIDES-DOCUSATE SODIUM 8.6-50 MG PO TABS
1.0000 | ORAL_TABLET | Freq: Every evening | ORAL | Status: DC | PRN
  Administered 2016-04-15: 1 via ORAL
  Filled 2016-04-12: qty 1

## 2016-04-12 MED ORDER — SODIUM CHLORIDE 0.9 % IV SOLN
INTRAVENOUS | Status: DC
  Administered 2016-04-12: 20:00:00 via INTRAVENOUS

## 2016-04-12 MED ORDER — CARVEDILOL 6.25 MG PO TABS
6.2500 mg | ORAL_TABLET | Freq: Two times a day (BID) | ORAL | Status: DC
  Filled 2016-04-12: qty 1

## 2016-04-12 MED ORDER — TIMOLOL MALEATE 0.5 % OP SOLN
1.0000 [drp] | Freq: Every day | OPHTHALMIC | Status: DC
  Administered 2016-04-12 – 2016-04-16 (×5): 1 [drp] via OPHTHALMIC
  Filled 2016-04-12: qty 5

## 2016-04-12 NOTE — ED Triage Notes (Signed)
Pt c/o blurry vision, vision changes and dizziness for about a hour. Pt has no facial droop, strong equal grips, no drift in all four extremities, speed is clear. Pt states the last time she had a stroke in November she had similar symptoms. Pt has hemianopia

## 2016-04-12 NOTE — ED Notes (Addendum)
Pt A&O x 4. NIH 1. Heart healthy dinner tray ordered. No family at bedside at this time. No special equipment needed for ambulation.

## 2016-04-12 NOTE — ED Provider Notes (Signed)
Level V caveat acute of situation. Code stroke Patient developed sudden onset blurred vision and left lower quadrant of visual field and generalized weakness 230 p.m. today. Visual changes have resolved. She now reports normal vision. She still complains of generalized weakness she denies pain anywhere. Exam alert Glasgow Coma Score 15 HEENT exam no facial asymmetry neck supple or logic motor strength 5 over 5 overall cranial nerves II through XII grossly intact alert awake oriented 3.    Orlie Dakin, MD 04/13/16 [phone]

## 2016-04-12 NOTE — Telephone Encounter (Signed)
Pt called said she went to the ED at a hospital in [place], [place] Saturday afternoon 04/10/16. She said she was saying words she didn't want to say, and having weakness in legs and body. She said she was dx with a stroke. An appt was scheduled with Dr Leonie Man tomorrow 8/15. Pt thought she should be seen sooner. I advised if the RN thought she should be seen sooner she will call.

## 2016-04-12 NOTE — ED Provider Notes (Signed)
[facility] DEPT Provider Note   CSN: [id number] Arrival date & time: 04/12/16  1515     History   Chief Complaint Chief Complaint  Patient presents with  . Dizziness  . Blurred Vision    HPI Kim Mullins is a 82 y.o. female.   Cerebrovascular Accident  This is a recurrent problem. The current episode started less than 1 hour ago. The problem occurs constantly. The problem has been rapidly improving. Pertinent negatives include no chest pain, no abdominal pain, no headaches and no shortness of breath. Nothing aggravates the symptoms. Nothing relieves the symptoms. She has tried nothing for the symptoms.    Past Medical History:  Diagnosis Date  . Anemia    occassionally  . Arthritis    all over.  . Asthma    Allergixc reaction to cats.  . Atrial fibrillation ([place])   . Bowel obstruction ([place])   . Chronic systolic CHF (congestive heart failure) (HCC)    EF 30-35% by cath, echo 2016 EF 50%  . Complete heart block (HCC)    a. s/p MDT CRTP pacemaker  . DCM (dilated cardiomyopathy) ([place]) 08/16/2014   normal coronary arteries on cath with EF 30-45%.  EF now 50% by echo 11/2014  . Diabetes mellitus 2006   Diet and exercise controlled.  Marland Kitchen GERD (gastroesophageal reflux disease)    occ  . Hypertension   . Left tibial fracture 2007  . OSA (obstructive sleep apnea) 10/23/2014   Moderate with AHI 21/hr  . Osteoarthritis of right shoulder region 06/26/2013  . Stroke [facility])     Patient Active Problem List   Diagnosis Date Noted  . Atrial fibrillation ([place]) 09/12/2015  . Occipital infarction ([place])   . CVA (cerebral infarction) 06/18/2015  . Homonymous hemianopsia   . Pacemaker 01/21/2015  . OSA (obstructive sleep apnea) 10/23/2014  . Dizziness 10/21/2014  . Complete heart block ([place]) 10/06/2014  . Benign essential HTN 10/04/2014  . Chronic systolic CHF (congestive heart failure) ([place]) 10/04/2014  . Abnormal cardiovascular stress test 08/27/2014  . Pulmonary HTN  ([place]) 08/16/2014  . DCM (dilated cardiomyopathy) ([place]) 08/16/2014  . Cough 07/13/2014  . Esophageal stricture 06/11/2014  . Nonspecific (abnormal) findings on radiological and other examination of gastrointestinal tract 05/29/2014  . Dyspnea 04/29/2014  . Osteoarthritis of right shoulder region 06/26/2013  . DJD (degenerative joint disease) of hip 11/16/2011    Class: Present on Admission    Past Surgical History:  Procedure Laterality Date  . ABDOMINAL HYSTERECTOMY  1969  . BI-VENTRICULAR PACEMAKER INSERTION N/A 10/07/2014   MDT CRTP implanted by Dr Lovena Le  . BRAVO [place] STUDY N/A 06/11/2014   Procedure: BRAVO [place] STUDY;  Surgeon: Inda Castle, MD;  Location: [facility] ENDOSCOPY;  Service: Endoscopy;  Laterality: N/A;  . BUNIONECTOMY  1984   Bilateral  . CARDIAC CATHETERIZATION     normal coronary arteries  . Carpal [place]  2004   Bilateral  . CATARACT EXTRACTION Right    early 2015  . corn removal  1999   Bilateral feet  . DILATION AND CURETTAGE OF UTERUS  1968  . ESOPHAGOGASTRODUODENOSCOPY (EGD) WITH PROPOFOL N/A 06/11/2014   Procedure: ESOPHAGOGASTRODUODENOSCOPY (EGD) WITH PROPOFOL;  Surgeon: Inda Castle, MD;  Location: [facility] ENDOSCOPY;  Service: Endoscopy;  Laterality: N/A;  . [place]   Surgery to fix Retainal detachment, bilateral  . JOINT REPLACEMENT    . LEFT AND RIGHT HEART CATHETERIZATION WITH CORONARY ANGIOGRAM N/A 08/29/2014   Procedure: LEFT AND  RIGHT HEART CATHETERIZATION WITH CORONARY ANGIOGRAM;  Surgeon: Peter M Martinique, MD;  Location: [facility] CATH LAB;  Service: Cardiovascular;  Laterality: N/A;  . MALONEY DILATION  06/11/2014   Procedure: Venia Minks DILATION;  Surgeon: Inda Castle, MD;  Location: [facility] ENDOSCOPY;  Service: Endoscopy;;  . PILONIDAL CYST EXCISION  1959  . TONSILLECTOMY  1942  . TOTAL HIP ARTHROPLASTY  11/16/2011   Procedure: TOTAL HIP ARTHROPLASTY ANTERIOR APPROACH;  Surgeon: Hessie Dibble, MD;  Location: [facility];  Service: Orthopedics;  Laterality:  Right;  DEPUY  . TOTAL SHOULDER ARTHROPLASTY Right 06/26/2013   Procedure: TOTAL SHOULDER ARTHROPLASTY;  Surgeon: Johnny Bridge, MD;  Location: [facility];  Service: Orthopedics;  Laterality: Right;    OB History    No data available       Home Medications    Prior to Admission medications   Medication Sig Start Date End Date Taking? Authorizing Provider  Acetaminophen (TYLENOL) 325 MG CAPS Take 325 mg by mouth daily as needed (for pain).     Historical Provider, MD  apixaban (ELIQUIS) 2.5 MG TABS tablet Take 2.5 mg by mouth 2 (two) times daily.    Historical Provider, MD  Ascorbic Acid (VITAMIN C PO) Take 1 tablet by mouth daily. Reported on 12/19/2015    Historical Provider, MD  Bisacodyl (LAXATIVE PO) Take 1 tablet by mouth daily as needed (for constipation).     Historical Provider, MD  CALCIUM PO Take 1 tablet by mouth daily.    Historical Provider, MD  carvedilol (COREG) 6.25 MG tablet Take 1 tablet (6.25 mg total) by mouth 2 (two) times daily with a meal. 03/19/16   Larey Dresser, MD  diclofenac sodium (VOLTAREN) 1 % GEL Apply 1 application topically 3 (three) times daily as needed (pain, inflammation).  04/23/15   Historical Provider, MD  diphenhydramine-acetaminophen (TYLENOL PM) 25-500 MG TABS tablet Take 1 tablet by mouth at bedtime as needed (sleep).     Historical Provider, MD  dorzolamide (TRUSOPT) 2 % ophthalmic solution Place 1 drop into both eyes 2 (two) times daily.  07/30/15   Historical Provider, MD  furosemide (LASIX) 40 MG tablet Take 40mg  (1 tablet) in the AM and 20mg  (1/2 tablet) in the PM 03/29/16   Larey Dresser, MD  lisinopril (PRINIVIL,ZESTRIL) 10 MG tablet Take 1 tablet (10 mg total) by mouth daily. 02/09/16   Sueanne Margarita, MD  spironolactone (ALDACTONE) 25 MG tablet Take 0.5 tablets (12.5 mg total) by mouth at bedtime. 03/19/16   Larey Dresser, MD  timolol (TIMOPTIC) 0.5 % ophthalmic solution Place 1 drop into both eyes daily.  07/29/15   Historical Provider,  MD    Family History Family History  Problem Relation Age of Onset  . Dementia Mother   . Coronary artery disease Mother   . Cancer Father     blood ? type  . Diabetes Maternal Grandfather   . Anuerysm Daughter     brain  . Colon cancer Neg Hx     Social History Social History  Substance Use Topics  . Smoking status: Former Smoker    Packs/day: 1.00    Years: 45.00    Types: Cigarettes    Quit date: 07/24/1978  . Smokeless tobacco: Never Used  . Alcohol use 4.2 oz/week    7 Glasses of wine per week     Comment: every day     Allergies   Hydrocodone; Percocet [oxycodone-acetaminophen]; Eggs or egg-derived products; Mercurial derivatives; Penicillins; and  Quinine derivatives   Review of Systems Review of Systems  Constitutional: Negative for chills, diaphoresis and fever.  HENT: Negative.   Eyes: Negative.   Respiratory: Negative for shortness of breath.   Cardiovascular: Negative for chest pain.  Gastrointestinal: Negative for abdominal pain.  Genitourinary: Negative.   Musculoskeletal: Negative.   Skin: Negative.   Neurological: Positive for speech difficulty (last week, transiently, none observed today), weakness (generalized) and light-headedness. Negative for seizures, syncope and headaches.  Psychiatric/Behavioral: Negative.  Negative for confusion.     Physical Exam Updated Vital Signs BP 90/68 Comment: manual bp to RA  Pulse 62   Temp 98.7 F (37.1 C) (Oral)   Resp 16   Ht 5\' 3"  (1.6 m)   Wt (S) 71.9 kg   SpO2 100%   BMI 28.09 kg/m   Physical Exam  Constitutional: She is oriented to person, place, and time. She appears well-developed and well-nourished. No distress.  HENT:  Head: Normocephalic and atraumatic.  Mouth/Throat: Oropharynx is clear and moist.  Eyes: Conjunctivae and EOM are normal. Pupils are equal, round, and reactive to light. No scleral icterus.  Neck: Normal range of motion. Neck supple. No tracheal deviation present.    Cardiovascular: Normal rate, regular rhythm, normal heart sounds and intact distal pulses.   No murmur heard. Pulmonary/Chest: Effort normal and breath sounds normal. No stridor. No respiratory distress. She has no wheezes. She has no rales.  Abdominal: Soft. She exhibits no distension. There is no tenderness. There is no rebound and no guarding.  Musculoskeletal: She exhibits no edema, tenderness or deformity.  Neurological: She is alert and oriented to person, place, and time. She has normal strength. No cranial nerve deficit or sensory deficit. Coordination normal. GCS eye subscore is 4. GCS verbal subscore is 5. GCS motor subscore is 6.  Skin: Skin is warm and dry. Capillary refill takes less than 2 seconds. She is not diaphoretic.  Psychiatric: She has a normal mood and affect. Her behavior is normal. Judgment and thought content normal.  Nursing note and vitals reviewed.    ED Treatments / Results  Labs (all labs ordered are listed, but only abnormal results are displayed) Labs Reviewed  PROTIME-INR - Abnormal; Notable for the following:       Result Value   Prothrombin Time 16.1 (*)    All other components within normal limits  APTT - Abnormal; Notable for the following:    aPTT 37 (*)    All other components within normal limits  CBC - Abnormal; Notable for the following:    Hemoglobin 11.2 (*)    HCT 35.3 (*)    All other components within normal limits  CBG MONITORING, ED - Abnormal; Notable for the following:    Glucose-Capillary 102 (*)    All other components within normal limits  I-STAT CHEM 8, ED - Abnormal; Notable for the following:    Creatinine, Ser 1.40 (*)    All other components within normal limits  I-STAT TROPOININ, ED - Abnormal; Notable for the following:    Troponin i, poc 0.12 (*)    All other components within normal limits  DIFFERENTIAL  ETHANOL  COMPREHENSIVE METABOLIC PANEL  URINE RAPID DRUG SCREEN, HOSP PERFORMED  URINALYSIS, ROUTINE W REFLEX  MICROSCOPIC (NOT AT [facility])    EKG  EKG Interpretation  Date/Time:  Monday April 12 2016 15:53:37 EDT Ventricular Rate:  64 PR Interval:    QRS Duration: 66 QT Interval:  600 QTC Calculation: 620 R Axis:  0 Text Interpretation:  Sinus rhythm Probable left atrial enlargement Left ventricular hypertrophy Posterior infarct, acute (LCx) Anterolateral infarct, age indeterminate Prolonged QT interval No significant change since last tracing Confirmed by Winfred Leeds  MD, SAM [id number]) on 04/12/2016 4:17:14 PM       Radiology Ct Head Code Stroke Wo Contrast  Addendum Date: 04/12/2016   ADDENDUM REPORT: 04/12/2016 15:50 ADDENDUM: Study discussed by telephone with Dr. Roland Rack on 04/12/2016 at 1548 hours. Electronically Signed   By: Genevie Ann M.D.   On: 04/12/2016 15:50   Result Date: 04/12/2016 CLINICAL DATA:  Code stroke. 82 year old female with vision loss. Initial encounter. EXAM: CT HEAD WITHOUT CONTRAST TECHNIQUE: Contiguous axial images were obtained from the base of the skull through the vertex without intravenous contrast. COMPARISON:  Head CT without contrast 11/23/2015, and earlier. FINDINGS: Visualized paranasal sinuses and mastoids are stable and well pneumatized. No acute osseous abnormality identified. Stable and negative visualized orbits soft tissues. Visualized scalp soft tissues are within normal limits. Calcified atherosclerosis at the skull base. Cerebral volume remains normal for age. No midline shift, ventriculomegaly, mass effect, evidence of mass lesion, intracranial hemorrhage or evidence of cortically based acute infarction. Gray-white matter differentiation is within normal limits throughout the brain. No suspicious intracranial vascular hyperdensity. ASPECTS [facility] Stroke Program Early CT Score) Total score (0-10 with 10 being normal): 10 IMPRESSION: 1. Stable and normal for age Normal noncontrast CT appearance of the brain. 2. ASPECTS is 10. Electronically Signed: By:  Genevie Ann M.D. On: 04/12/2016 15:47    Procedures Procedures (including critical care time)  Medications Ordered in ED Medications  sodium chloride 0.9 % bolus 1,000 mL (1,000 mLs Intravenous New Bag/Given 04/12/16 1550)      Initial Impression / Assessment and Plan / ED Course  I have reviewed the triage vital signs and the nursing notes.  Pertinent labs & imaging results that were available during my care of the patient were reviewed by me and considered in my medical decision making (see chart for details).  Clinical Course   82 year old female with a past medical history of prior stroke resulting in the left upper quadranopsia per neurology who presents to the emergency department with blurred vision bilaterally, generalized weakness and feeling poor overall. She states that she felt similar getting her stroke last year. NIH score of 1. Strength grossly intact. No sensory deficits. Cranial nerves intact. Coordination intact. Neurology at bedside for evaluation. patient has a troponin of 0.12 without any new signs of ischemia on EKG. Mild anemia Was seen in [place] for over the weekend however was not admitted. Had a diagnosis of UTI at that time. Due to this likely being reactivation vs new stroke per neurology, will admit to medicine for further care and management and risk stratification. MRI pending because of pacemaker in patient. Patient in agreement with plan. Pt and VS stable during stay in ED.  Final Clinical Impressions(s) / ED Diagnoses   Final diagnoses:  Vision loss  TIA (transient ischemic attack)  CVA (cerebral infarction)    New Prescriptions New Prescriptions   No medications on file     Darlina Rumpf, MD 04/16/16 [place], MD 04/16/16 [phone]

## 2016-04-12 NOTE — ED Notes (Signed)
Last neuro check completed at 2038.

## 2016-04-12 NOTE — Consult Note (Signed)
Neurology Consultation Reason for Consult: Visual field cut Referring Physician: Lyn Hollingshead  CC: Visual field cut  History is obtained from: Patient  HPI: Kim Mullins is a 82 y.o. female who was in her normal state earlier today at which time she noticed that her left upper visual field became impaired. She had a stroke previously and had this deficit with her previous stroke, but states that it gotten significantly better and then suddenly became much worse at that time. She also notes that about a week ago, she had a transient episode of garbled speech which improved. She went to a hospital in [place] at that time who diagnosed her with urinary tract infection and discharged her.   LKW: [id number] tpa given?: no, mild symptoms    ROS: A 14 point ROS was performed and is negative except as noted in the HPI.   Past Medical History:  Diagnosis Date  . Anemia    occassionally  . Arthritis    all over.  . Asthma    Allergixc reaction to cats.  . Atrial fibrillation ([place])   . Bowel obstruction ([place])   . Chronic systolic CHF (congestive heart failure) (HCC)    EF 30-35% by cath, echo 2016 EF 50%  . Complete heart block (HCC)    a. s/p MDT CRTP pacemaker  . DCM (dilated cardiomyopathy) ([place]) 08/16/2014   normal coronary arteries on cath with EF 30-45%.  EF now 50% by echo 11/2014  . Diabetes mellitus 2006   Diet and exercise controlled.  Marland Kitchen GERD (gastroesophageal reflux disease)    occ  . Hypertension   . Left tibial fracture 2007  . OSA (obstructive sleep apnea) 10/23/2014   Moderate with AHI 21/hr  . Osteoarthritis of right shoulder region 06/26/2013  . Stroke [facility])      Family History  Problem Relation Age of Onset  . Dementia Mother   . Coronary artery disease Mother   . Cancer Father     blood ? type  . Diabetes Maternal Grandfather   . Anuerysm Daughter     brain  . Colon cancer Neg Hx      Social History:  reports that she quit smoking about 37 years  ago. Her smoking use included Cigarettes. She has a 45.00 pack-year smoking history. She has never used smokeless tobacco. She reports that she drinks about 4.2 oz of alcohol per week . She reports that she does not use drugs.   Exam: Current vital signs: BP 90/68 Comment: manual bp to RA  Pulse 62   Temp 98.7 F (37.1 C) (Oral)   Resp 16   Ht 5\' 3"  (1.6 m)   Wt (S) 158 lb 9.6 oz (71.9 kg)   SpO2 100%   BMI 28.09 kg/m  Vital signs in last 24 hours: Temp:  [98.7 F (37.1 C)] 98.7 F (37.1 C) (08/14 1526) Pulse Rate:  [62] 62 (08/14 1526) Resp:  [16] 16 (08/14 1526) BP: (90-91)/(56-68) 90/68 (08/14 1551) SpO2:  [100 %] 100 % (08/14 1526) Weight:  [158 lb 9.6 oz (71.9 kg)] 158 lb 9.6 oz (71.9 kg) (08/14 1527)   Physical Exam  Constitutional: Appears well-developed and well-nourished.  Psych: Affect appropriate to situation Eyes: No scleral injection HENT: No OP obstrucion Head: Normocephalic.  Cardiovascular: Normal rate and regular rhythm.  Respiratory: Effort normal and breath sounds normal to anterior ascultation GI: Soft.  No distension. There is no tenderness.  Skin: WDI  Neuro: Mental Status: Patient is awake,  alert, oriented to person, place, month, year, and situation. Patient is able to give a clear and coherent history. No signs of aphasia or neglect Cranial Nerves: II: Left upper hemianopia. Pupils are equal, round, and reactive to light.   III,IV, VI: EOMI without ptosis or diploplia.  V: Facial sensation is symmetric to temperature VII: Facial movement is symmetric.  VIII: hearing is intact to voice X: Uvula elevates symmetrically XI: Shoulder shrug is symmetric. XII: tongue is midline without atrophy or fasciculations.  Motor: Tone is normal. Bulk is normal. She has no drift, the confrontation she appears to have a possible mild left arm weakness but this is very mild. Sensory: Sensation is symmetric to light touch and temperature in the arms and legs.  No extinction Cerebellr: FNF intact on the right, possibly mildly impaired on the left  I have reviewed labs in epic and the results pertinent to this consultation are: Chem 8-borderline creatinine  I have reviewed the images obtained: CT head-no acute findings  Impression: 82 year old female with a history of previous strokes who presents with left upper field cut which is apparently worsened from her baseline, and indeed on 6/6 when she saw Dr. Leonie Man this was not noted. I would favor, especially given the episode she describes last week, of treating this as mild stroke or possible TIA.  Recommendations: 1. HgbA1c, fasting lipid panel 2. Repeat head CT tomorrow  3. Frequent neuro checks 4. Echocardiogram 5. Carotid dopplers 6. Prophylactic therapy-Eliquis 7. Risk factor modification 8. Telemetry monitoring 9. PT consult, OT consult, Speech consult 10. please page stroke NP  Or  PA  Or MD  M-F from 8am -4 pm starting 8/15 as this patient will be followed by the stroke team at this point.   You can look them up on [email or web address]      Roland Rack, MD [facility] [phone]  If 7pm- 7am, please page neurology on call as listed in [place].

## 2016-04-12 NOTE — H&P (Signed)
History and Physical    Kim Mullins [id number] DOB: 05/06/1930 DOA: 04/12/2016  PCP: Kristine Garbe, MD Patient coming from: Home  Chief Complaint: Blurry vision  HPI: Kim Mullins is a 82 y.o. female with medical history significant of chronic systolic CHF/nonischemic cardiomyopathy, complete heart block status post pacemaker placement, paroxysmal atrial fibrillation on chronic anticoagulation, history of CHF last EF 15-20% right ventricular dysfunction on echo of June 2017, prior history of CVA/TIA presented to the ED with sudden onset of blurry vision. Patient states she was watching television around 2 PM when she noted she had some blurry vision she called her emergency contact guard brought to the emergency room. Patient denied any slurred speech, no expressive aphasia, no facial droop, no asymmetric weakness or numbness, no melena, no hematochezia, no hematemesis, no diarrhea, no constipation, no fever, no chills, no nausea, no vomiting, no chest pain, no shortness of breath, no abdominal pain, no dysuria. Patient does endorse some generalized weakness. Patient also complains of some fuzziness. Patient does state that 2 days prior to admission she was visiting in [place] when she had an expressive aphasia went to the hospital however MRI could not be done secondary to a pacemaker. Patient states she was treated for urinary tract infection and has taken 2 days of antibiotics as well as dehydration and discharged. Patient states she had similar symptoms the last time she was diagnosed with a CVA/TIA in October.   ED Course: Patient was seen in the emergency room, code stroke was called however due to improvement in symptoms TPN was not administered. CT head which was done was negative for any acute abnormalities. Comprehensive metabolic profile done at a creatinine of 1.43 otherwise is within normal limits. Point-of-care troponin was 0.12. CBC obtained at a hemoglobin of 11.2  otherwise was unremarkable. INR was 1.28. Neurology was consulted and saw the patient in consultation.[facility] were called to admit the patient for further evaluation and management.  Review of Systems: As per HPI otherwise 10 point review of systems negative  Past Medical History:  Diagnosis Date  . Anemia    occassionally  . Arthritis    all over.  . Asthma    Allergixc reaction to cats.  . Atrial fibrillation ([place])   . Bowel obstruction ([place])   . Chronic systolic CHF (congestive heart failure) (HCC)    EF 30-35% by cath, echo 2016 EF 50%  . Complete heart block (HCC)    a. s/p MDT CRTP pacemaker  . DCM (dilated cardiomyopathy) ([place]) 08/16/2014   normal coronary arteries on cath with EF 30-45%.  EF now 50% by echo 11/2014  . Diabetes mellitus 2006   Diet and exercise controlled.  Marland Kitchen GERD (gastroesophageal reflux disease)    occ  . Hypertension   . Left tibial fracture 2007  . OSA (obstructive sleep apnea) 10/23/2014   Moderate with AHI 21/hr  . Osteoarthritis of right shoulder region 06/26/2013  . Stroke [facility])     Past Surgical History:  Procedure Laterality Date  . ABDOMINAL HYSTERECTOMY  1969  . BI-VENTRICULAR PACEMAKER INSERTION N/A 10/07/2014   MDT CRTP implanted by Dr Lovena Le  . BRAVO [place] STUDY N/A 06/11/2014   Procedure: BRAVO [place] STUDY;  Surgeon: Inda Castle, MD;  Location: [facility] ENDOSCOPY;  Service: Endoscopy;  Laterality: N/A;  . BUNIONECTOMY  1984   Bilateral  . CARDIAC CATHETERIZATION     normal coronary arteries  . Carpal [place]  2004   Bilateral  . CATARACT  EXTRACTION Right    early 2015  . corn removal  1999   Bilateral feet  . DILATION AND CURETTAGE OF UTERUS  1968  . ESOPHAGOGASTRODUODENOSCOPY (EGD) WITH PROPOFOL N/A 06/11/2014   Procedure: ESOPHAGOGASTRODUODENOSCOPY (EGD) WITH PROPOFOL;  Surgeon: Inda Castle, MD;  Location: [facility] ENDOSCOPY;  Service: Endoscopy;  Laterality: N/A;  . Rodriguez Hevia   Surgery to fix Retainal detachment,  bilateral  . JOINT REPLACEMENT    . LEFT AND RIGHT HEART CATHETERIZATION WITH CORONARY ANGIOGRAM N/A 08/29/2014   Procedure: LEFT AND RIGHT HEART CATHETERIZATION WITH CORONARY ANGIOGRAM;  Surgeon: Peter M Martinique, MD;  Location: [facility] CATH LAB;  Service: Cardiovascular;  Laterality: N/A;  . MALONEY DILATION  06/11/2014   Procedure: Venia Minks DILATION;  Surgeon: Inda Castle, MD;  Location: [facility] ENDOSCOPY;  Service: Endoscopy;;  . PILONIDAL CYST EXCISION  1959  . TONSILLECTOMY  1942  . TOTAL HIP ARTHROPLASTY  11/16/2011   Procedure: TOTAL HIP ARTHROPLASTY ANTERIOR APPROACH;  Surgeon: Hessie Dibble, MD;  Location: [place];  Service: Orthopedics;  Laterality: Right;  DEPUY  . TOTAL SHOULDER ARTHROPLASTY Right 06/26/2013   Procedure: TOTAL SHOULDER ARTHROPLASTY;  Surgeon: Johnny Bridge, MD;  Location: [place];  Service: Orthopedics;  Laterality: Right;     reports that she quit smoking about 37 years ago. Her smoking use included Cigarettes. She has a 45.00 pack-year smoking history. She has never used smokeless tobacco. She reports that she drinks about 4.2 oz of alcohol per week . She reports that she does not use drugs.    Family History  Problem Relation Age of Onset  . Dementia Mother   . Coronary artery disease Mother   . Cancer Father     blood ? type  . Diabetes Maternal Grandfather   . Anuerysm Daughter     brain  . Colon cancer Neg Hx    Family history reviewed and not pertinent.  Prior to Admission medications   Medication Sig Start Date End Date Taking? Authorizing Provider  Acetaminophen (TYLENOL) 325 MG CAPS Take 325 mg by mouth daily as needed (for shoulder pain).    Yes Historical Provider, MD  apixaban (ELIQUIS) 2.5 MG TABS tablet Take 2.5 mg by mouth 2 (two) times daily.   Yes Historical Provider, MD  Ascorbic Acid (VITAMIN C PO) Take 1 tablet by mouth daily. Reported on 12/19/2015   Yes Historical Provider, MD  CALCIUM PO Take 1 tablet by mouth daily.   Yes Historical  Provider, MD  carvedilol (COREG) 6.25 MG tablet Take 1 tablet (6.25 mg total) by mouth 2 (two) times daily with a meal. 03/19/16  Yes Larey Dresser, MD  diclofenac sodium (VOLTAREN) 1 % GEL Apply 1 application topically 3 (three) times daily as needed (pain, inflammation).  04/23/15  Yes Historical Provider, MD  diphenhydramine-acetaminophen (TYLENOL PM) 25-500 MG TABS tablet Take 1 tablet by mouth at bedtime as needed (sleep).    Yes Historical Provider, MD  dorzolamide (TRUSOPT) 2 % ophthalmic solution Place 1 drop into both eyes 2 (two) times daily.  07/30/15  Yes Historical Provider, MD  furosemide (LASIX) 40 MG tablet Take 40mg  (1 tablet) in the AM and 20mg  (1/2 tablet) in the PM 03/29/16  Yes Larey Dresser, MD  lisinopril (PRINIVIL,ZESTRIL) 10 MG tablet Take 1 tablet (10 mg total) by mouth daily. 02/09/16  Yes Sueanne Margarita, MD  spironolactone (ALDACTONE) 25 MG tablet Take 0.5 tablets (12.5 mg total) by mouth at bedtime. 03/19/16  Yes Larey Dresser, MD  timolol (TIMOPTIC) 0.5 % ophthalmic solution Place 1 drop into both eyes daily.  07/29/15  Yes Historical Provider, MD  Bisacodyl (LAXATIVE PO) Take 1 tablet by mouth daily as needed (for constipation).     Historical Provider, MD    Physical Exam: Vitals:   04/12/16 1715 04/12/16 1730 04/12/16 1830 04/12/16 1843  BP: 102/66 116/90 131/86 131/86  Pulse:  71  64  Resp: 23 14 19 19   Temp:      TempSrc:      SpO2: 100% 100% 98% 98%  Weight:      Height:          Constitutional: NAD, calm, comfortable Vitals:   04/12/16 1715 04/12/16 1730 04/12/16 1830 04/12/16 1843  BP: 102/66 116/90 131/86 131/86  Pulse:  71  64  Resp: 23 14 19 19   Temp:      TempSrc:      SpO2: 100% 100% 98% 98%  Weight:      Height:       Eyes: PERRLA, lids and conjunctivae normal ENMT: Mucous membranes are moist. Posterior pharynx clear of any exudate or lesions.Normal dentition.  Neck: normal, supple, no masses, no thyromegaly Respiratory: clear to  auscultation bilaterally, no wheezing, no crackles. Normal respiratory effort. No accessory muscle use.  Cardiovascular: Regular rate and rhythm, no murmurs / rubs / gallops. No extremity edema. 2+ pedal pulses. No carotid bruits.  Abdomen: no tenderness, no masses palpated. No hepatosplenomegaly. Bowel sounds positive.  Musculoskeletal: no clubbing / cyanosis. No joint deformity upper and lower extremities. Good ROM, no contractures. Normal muscle tone.  Skin: no rashes, lesions, ulcers. No induration Neurologic: Alert and oriented 3 .CN 2-12 grossly intact. Sensation intact, DTR normal. Strength 5/5 in all 4. Finger to nose with slight dysmetria on the right. Heel-to-shin intact. Visual fields intact. Moving extremities spontaneously. Unable to illicit reflexes symmetrically and diffusely. Gait not tested secondary to safety. Psychiatric: Normal judgment and insight. Alert and oriented x 3. Normal mood.   Labs on Admission: I have personally reviewed following labs and imaging studies  CBC:  Recent Labs Lab 04/12/16 1524 04/12/16 1538  WBC 4.2  --   NEUTROABS 2.2  --   HGB 11.2* 12.2  HCT 35.3* 36.0  MCV 88.3  --   PLT 268  --    Basic Metabolic Panel:  Recent Labs Lab 04/12/16 1524 04/12/16 1538  NA 135 138  K 4.2 4.2  CL 107 105  CO2 21*  --   GLUCOSE 100* 96  BUN 17 18  CREATININE 1.43* 1.40*  CALCIUM 9.0  --    GFR: Estimated Creatinine Clearance: 28.9 mL/min (by C-G formula based on SCr of 1.4 mg/dL). Liver Function Tests:  Recent Labs Lab 04/12/16 1524  AST 21  ALT 10*  ALKPHOS 54  BILITOT 1.1  PROT 7.2  ALBUMIN 3.4*   No results for input(s): LIPASE, AMYLASE in the last 168 hours. No results for input(s): AMMONIA in the last 168 hours. Coagulation Profile:  Recent Labs Lab 04/12/16 1524  INR 1.28   Cardiac Enzymes: No results for input(s): CKTOTAL, CKMB, CKMBINDEX, TROPONINI in the last 168 hours. BNP (last 3 results) No results for input(s):  PROBNP in the last 8760 hours. HbA1C: No results for input(s): HGBA1C in the last 72 hours. CBG:  Recent Labs Lab 04/12/16 1524  GLUCAP 102*   Lipid Profile: No results for input(s): CHOL, HDL, LDLCALC, TRIG, CHOLHDL, LDLDIRECT in the last  72 hours. Thyroid Function Tests: No results for input(s): TSH, T4TOTAL, FREET4, T3FREE, THYROIDAB in the last 72 hours. Anemia Panel: No results for input(s): VITAMINB12, FOLATE, FERRITIN, TIBC, IRON, RETICCTPCT in the last 72 hours. Urine analysis:    Component Value Date/Time   COLORURINE YELLOW 04/12/2016 1730   APPEARANCEUR CLEAR 04/12/2016 1730   LABSPEC <1.005 (L) 04/12/2016 1730   PHURINE 6.5 04/12/2016 1730   GLUCOSEU NEGATIVE 04/12/2016 1730   GLUCOSEU NEGATIVE 07/10/2014 1517   HGBUR NEGATIVE 04/12/2016 1730   BILIRUBINUR NEGATIVE 04/12/2016 1730   KETONESUR NEGATIVE 04/12/2016 1730   PROTEINUR NEGATIVE 04/12/2016 1730   UROBILINOGEN 0.2 10/06/2014 1552   NITRITE NEGATIVE 04/12/2016 1730   LEUKOCYTESUR MODERATE (A) 04/12/2016 1730   )No results found for this or any previous visit (from the past 240 hour(s)).   Radiological Exams on Admission: Ct Head Code Stroke Wo Contrast  Addendum Date: 04/12/2016   ADDENDUM REPORT: 04/12/2016 15:50 ADDENDUM: Study discussed by telephone with Dr. Roland Rack on 04/12/2016 at 1548 hours. Electronically Signed   By: Genevie Ann M.D.   On: 04/12/2016 15:50   Result Date: 04/12/2016 CLINICAL DATA:  Code stroke. 82 year old female with vision loss. Initial encounter. EXAM: CT HEAD WITHOUT CONTRAST TECHNIQUE: Contiguous axial images were obtained from the base of the skull through the vertex without intravenous contrast. COMPARISON:  Head CT without contrast 11/23/2015, and earlier. FINDINGS: Visualized paranasal sinuses and mastoids are stable and well pneumatized. No acute osseous abnormality identified. Stable and negative visualized orbits soft tissues. Visualized scalp soft tissues are  within normal limits. Calcified atherosclerosis at the skull base. Cerebral volume remains normal for age. No midline shift, ventriculomegaly, mass effect, evidence of mass lesion, intracranial hemorrhage or evidence of cortically based acute infarction. Gray-white matter differentiation is within normal limits throughout the brain. No suspicious intracranial vascular hyperdensity. ASPECTS [facility] Stroke Program Early CT Score) Total score (0-10 with 10 being normal): 10 IMPRESSION: 1. Stable and normal for age Normal noncontrast CT appearance of the brain. 2. ASPECTS is 10. Electronically Signed: By: Genevie Ann M.D. On: 04/12/2016 15:47    EKG: Independently reviewed. QTC prolongation. RBBB.   Assessment/Plan Principal Problem:   TIA (transient ischemic attack) Active Problems:   Osteoarthritis of right shoulder region   Dyspnea   Pulmonary HTN (HCC)   DCM (dilated cardiomyopathy) (HCC)   Benign essential HTN   Chronic systolic CHF (congestive heart failure) (HCC)   Complete heart block (HCC)   Pacemaker   CVA (cerebral infarction)   Atrial fibrillation (HCC)   UTI (lower urinary tract infection)   AKI (acute kidney injury) ([place])    #1 probable TIA Patient presented with blurry vision onset 2 PM this afternoon, which has since improved significantly. Patient also noted to have an episode of expressive aphasia 2 days prior to admission while she was out of town however could not get an MRI done due to her pacemaker. CT head unremarkable. Will admit patient for stroke workup. Admission to telemetry. Check MRI/MRA of head if MRI compatible with patient's pacemaker. If MRI is not compatible patient will likely need a CT angiogram of the head and neck. Check carotid Dopplers. Check a 2-D echo. Check a fasting lipid panel. Check a hemoglobin A1c. Place on home regimen of eliquis for secondary stroke prevention. PT/OT/ST. Neurology following and appreciate input and recommendations.  #2 recent  UTI Patient states 2 days prior to admission was diagnosed with a UTI and treated with oral antibiotics which she  cannot remember. Patient is not sure of antibiotic she was on. Will repeat UA with cultures and sensitivities. Place empirically on IV Ciprofloxacin. Follow.  #3 acute kidney injury Questionable etiology. No recent change in patient's medications. Check a UA with cultures and sensitivities. Check a urine sodium. Check a urine creatinine. Follow renal function. Monitor closely with patient's ACE inhibitor and diuretics. If worsening renal function will discontinue or hold ACE inhibitor. Gentle hydration 1 day.  #4 atrial fibrillation CHA2DS2VASC score 6 Currently rate controlled. Continue Coreg for rate control. At the exit been for anticoagulation.  #5 nonischemic cardiomyopathy no coronary disease on cath of December 0000000 systolic [id number] echo EF 0000000 with diffuse hypokinesis 02/06/2016/ Stable. Patient currently denies any chest pain or shortness of breath. Continue home regimen of Coreg, lisinopril, spironolactone, Lasix.   #6 history of complete heart block status post BiV pacing/[employer] CRT-P Stable.  #7 obstructive sleep apnea CPAP daily at bedtime.   DVT prophylaxis: Eliquis Code Status: Full Family Communication: Updated patient and emergency contact at bedside. Disposition Plan: Home when TIA work up completed. Consults called: Neurology: Dr Leonel Ramsay Admission status: Observation   Lanelle Lindo MD [facility] Pager [phone]  If 7PM-7AM, please contact night-coverage [email or web address] Password [email or web address]  04/12/2016, 7:02 PM

## 2016-04-12 NOTE — Code Documentation (Signed)
82yo female arriving to [facility] via private vehicle at 1515.  Patient reports sudden onset vision changes and dizziness at 1420.  Patient has a h/o stroke last year with similar symptoms.  Code stroke called on patient arrival.  Patient to CT.  Stroke team to the bedside.  CT completed.  NIHSS 1, see documentation for details and code stroke times.  Patient LUQ vision loss on exam.  Patient is too mild to treat with tPA at this time, however, she remains in the window to treat with tPA until 1850 should symptoms worsen.  Bedside handoff with ED RN Earnest Bailey.

## 2016-04-12 NOTE — ED Provider Notes (Signed)
Level V caveat acuity of situation Patient developed sudden onset of blurred vision at the left lower quadrant of her visual field and generalized weakness 2:30 PM today. She denied pain she feels improved since arrival here without treatment and states her vision is presently normal. Code stroke called at triage. On exam patient is alert Glasgow Coma Score 15 HEENT exam no facial asymmetry lungs neurologic moves all extremities motor strength 5 over 5 overall cranial nerves II through XII grossly intact   Orlie Dakin, MD 04/13/16 0050

## 2016-04-13 ENCOUNTER — Observation Stay (HOSPITAL_BASED_OUTPATIENT_CLINIC_OR_DEPARTMENT_OTHER): Payer: Medicare Other

## 2016-04-13 ENCOUNTER — Encounter (HOSPITAL_COMMUNITY): Payer: Self-pay | Admitting: Radiology

## 2016-04-13 ENCOUNTER — Ambulatory Visit: Payer: Medicare Other | Admitting: Neurology

## 2016-04-13 ENCOUNTER — Observation Stay (HOSPITAL_COMMUNITY): Payer: Medicare Other

## 2016-04-13 DIAGNOSIS — G450 Vertebro-basilar artery syndrome: Secondary | ICD-10-CM | POA: Diagnosis not present

## 2016-04-13 DIAGNOSIS — I5022 Chronic systolic (congestive) heart failure: Secondary | ICD-10-CM | POA: Diagnosis not present

## 2016-04-13 DIAGNOSIS — I10 Essential (primary) hypertension: Secondary | ICD-10-CM | POA: Diagnosis not present

## 2016-04-13 DIAGNOSIS — G458 Other transient cerebral ischemic attacks and related syndromes: Principal | ICD-10-CM

## 2016-04-13 DIAGNOSIS — I420 Dilated cardiomyopathy: Secondary | ICD-10-CM

## 2016-04-13 DIAGNOSIS — I480 Paroxysmal atrial fibrillation: Secondary | ICD-10-CM | POA: Diagnosis not present

## 2016-04-13 DIAGNOSIS — G459 Transient cerebral ischemic attack, unspecified: Secondary | ICD-10-CM | POA: Diagnosis not present

## 2016-04-13 DIAGNOSIS — Z7901 Long term (current) use of anticoagulants: Secondary | ICD-10-CM

## 2016-04-13 DIAGNOSIS — I952 Hypotension due to drugs: Secondary | ICD-10-CM | POA: Diagnosis present

## 2016-04-13 DIAGNOSIS — I63332 Cerebral infarction due to thrombosis of left posterior cerebral artery: Secondary | ICD-10-CM

## 2016-04-13 DIAGNOSIS — N179 Acute kidney failure, unspecified: Secondary | ICD-10-CM | POA: Diagnosis not present

## 2016-04-13 LAB — VAS US CAROTID
LCCADDIAS: -12 cm/s
LEFT ECA DIAS: 4 cm/s
LEFT VERTEBRAL DIAS: 24 cm/s
LICADSYS: -47 cm/s
LICAPDIAS: 9 cm/s
LICAPSYS: 52 cm/s
Left CCA dist sys: -56 cm/s
Left CCA prox dias: -13 cm/s
Left CCA prox sys: -87 cm/s
Left ICA dist dias: -16 cm/s
RCCADSYS: -44 cm/s
RIGHT ECA DIAS: 9 cm/s
RIGHT VERTEBRAL DIAS: 13 cm/s
Right CCA prox dias: 14 cm/s
Right CCA prox sys: 82 cm/s

## 2016-04-13 LAB — TROPONIN I
Troponin I: 0.11 ng/mL (ref <0.03)
Troponin I: 0.09 ng/mL (ref <0.03)

## 2016-04-13 LAB — LIPID PANEL
CHOL/HDL RATIO: 3 ratio
CHOLESTEROL: 90 mg/dL (ref 0–200)
HDL: 30 mg/dL — AB (ref >40)
LDL Cholesterol: 46 mg/dL (ref 0–99)
TRIGLYCERIDES: 69 mg/dL (ref <150)
VLDL: 14 mg/dL (ref 0–40)

## 2016-04-13 LAB — BASIC METABOLIC PANEL
Anion gap: 8 (ref 5–15)
BUN: 15 mg/dL (ref 6–20)
CALCIUM: 8.3 mg/dL — AB (ref 8.9–10.3)
CHLORIDE: 107 mmol/L (ref 101–111)
CO2: 20 mmol/L — ABNORMAL LOW (ref 22–32)
CREATININE: 1.17 mg/dL — AB (ref 0.44–1.00)
GFR, EST AFRICAN AMERICAN: 49 mL/min — AB (ref >60)
GFR, EST NON AFRICAN AMERICAN: 42 mL/min — AB (ref >60)
Glucose, Bld: 106 mg/dL — ABNORMAL HIGH (ref 65–99)
Potassium: 3.8 mmol/L (ref 3.5–5.1)
SODIUM: 135 mmol/L (ref 135–145)

## 2016-04-13 LAB — ECHOCARDIOGRAM LIMITED
Height: 65 in
Weight: 2631.41 oz

## 2016-04-13 LAB — URINE CULTURE

## 2016-04-13 LAB — CBC
HCT: 30.3 % — ABNORMAL LOW (ref 36.0–46.0)
Hemoglobin: 9.7 g/dL — ABNORMAL LOW (ref 12.0–15.0)
MCH: 28.1 pg (ref 26.0–34.0)
MCHC: 32 g/dL (ref 30.0–36.0)
MCV: 87.8 fL (ref 78.0–100.0)
PLATELETS: 235 10*3/uL (ref 150–400)
RBC: 3.45 MIL/uL — AB (ref 3.87–5.11)
RDW: 13.9 % (ref 11.5–15.5)
WBC: 4.3 10*3/uL (ref 4.0–10.5)

## 2016-04-13 LAB — CREATININE, URINE, RANDOM: CREATININE, URINE: 39.55 mg/dL

## 2016-04-13 LAB — SODIUM, URINE, RANDOM: SODIUM UR: 44 mmol/L

## 2016-04-13 MED ORDER — APIXABAN 5 MG PO TABS
5.0000 mg | ORAL_TABLET | Freq: Two times a day (BID) | ORAL | Status: DC
  Administered 2016-04-13 – 2016-04-16 (×6): 5 mg via ORAL
  Filled 2016-04-13 (×6): qty 1

## 2016-04-13 MED ORDER — IOPAMIDOL (ISOVUE-370) INJECTION 76%
INTRAVENOUS | Status: AC
  Administered 2016-04-13: 50 mL
  Filled 2016-04-13: qty 50

## 2016-04-13 MED ORDER — SODIUM CHLORIDE 0.9 % IV BOLUS (SEPSIS)
250.0000 mL | Freq: Once | INTRAVENOUS | Status: AC
  Administered 2016-04-13: 250 mL via INTRAVENOUS

## 2016-04-13 MED ORDER — CIPROFLOXACIN IN D5W 400 MG/200ML IV SOLN
400.0000 mg | Freq: Two times a day (BID) | INTRAVENOUS | Status: AC
Start: 2016-04-13 — End: 2016-04-16
  Administered 2016-04-13 – 2016-04-16 (×7): 400 mg via INTRAVENOUS
  Filled 2016-04-13 (×7): qty 200

## 2016-04-13 MED ORDER — IOPAMIDOL (ISOVUE-370) INJECTION 76%
INTRAVENOUS | Status: AC
  Filled 2016-04-13: qty 50

## 2016-04-13 NOTE — Progress Notes (Signed)
PT HAS PACEMAKER THAT IS NOT MRI SAFE, CONTACTED [employer] REP, DR THOMPSON HAS BEEN NOTIFIED

## 2016-04-13 NOTE — Progress Notes (Signed)
PROGRESS NOTE    Kim Mullins  [id number] DOB: 1930-01-14 DOA: 04/12/2016 PCP: Kristine Garbe, MD    Brief Narrative:  82 year old female history of chronic systolic heart failure/nonischemic cardiomyopathy last EF 15-20% per 2-D echo June 2017, history of complete heart block status post pacemaker placement, prior history of CVA/TIA presented to the ED with blurry vision onset on the day of admission. 2-3 days prior to admission patient also noted to have an expressive aphasia while traveling in [place]. Patient admitted for TIA versus stroke workup. Neurology and cardiology following.   Assessment & Plan:   Principal Problem:   TIA (transient ischemic attack) Active Problems:   Osteoarthritis of right shoulder region   Dyspnea   Pulmonary HTN (HCC)   DCM (dilated cardiomyopathy) (HCC)   Benign essential HTN   Chronic systolic CHF (congestive heart failure) (HCC)   Complete heart block (HCC)   Pacemaker   CVA (cerebral infarction)   Atrial fibrillation (HCC)   UTI (lower urinary tract infection)   AKI (acute kidney injury) ([place])  #1 probable TIA versus acute stroke Patient presenting with blurry vision and an expressive aphasia 2-3 days prior to admission which had since resolved. Likely secondary to hypoperfusion with hypotension and possibly underdosing of anticoagulation of eliquis. Blurry vision improved. CT head done on admission was negative. Due to pacemaker patient unable to undergo MRI and to rule out CVA. CT angiogram of the head and neck have been ordered. Carotid Dopplers with no significant ICA stenosis. 2-D echo with a EF of 40-45% with diffuse hypokinesis and no cardiac source of emboli. Ejection fraction improved from last 2-D echo of June 2017. LDL of 46 and at goal. Patient with history of atrial fibrillation and on eliquis 2.5 mg twice daily which is likely underdosed and dose has been increased to eliquis 5 mg BID, per cardiology. Patient also noted to  be hypotensive with systolic blood pressures in the 70 - 80s and diuretics held. Concerned that patient may be having decreased blood perfusion to the brain. Diuretics and cardiac medications to be adjusted per cardiology. PT/OT. Neurology following and appreciate input and recommendations.  #2 hypotension Likely secondary to diuretics and antihypertensive medications. Patient given a small bolus of normal saline. Hold diuretics and antihypertensive medications. Cardiology has been consulted and will adjust patient's cardiac medications.  #3 chronic systolic heart failure/nonischemic cardiomyopathy 2-D echo with a EF of 40-45% with diffuse hypokinesis. Improvement in ejection fraction from 2-D echo of June 2017 with patient had an EF of 15-20%. Patient's antihypertensives and diuretics on hold secondary to hypotension. Patient on gentle hydration. Saline lock IV fluids. Cardiology following.  #4 paroxysmal atrial fibrillation CHA2DS2VASC = 6 Patient currently normal sinus rhythm. Coreg on hold for now. Anticoagulation of eliquis dose has been increased to 5 mg twice daily per cardiology.  #5 probable UTI Urine cultures pending. IV ciprofloxacin.  #6 acute kidney injury Improvement. Diuretics and ACE inhibitor on hold. Gentle hydration. Follow.  #7 history of complete heart block status post BiV pacing/MedtronicCRT-P  #8 obstructive sleep apnea CPAP daily at bedtime.   DVT prophylaxis: apixaban Code Status: Full Family Communication: Updated patient and family at bedside. Disposition Plan: Pending stroke workup and PT evaluation.   Consultants:   Neurology: Dr. Leonel Ramsay 04/12/2016  Cardiology Dr. Aundra Dubin 04/13/2016  Procedures:   CT head 04/12/2016  2-D echo 04/13/2016  Carotid Dopplers 04/13/2016  CT angiogram head and neck 04/13/2016  Antimicrobials:   IV ciprofloxacin 04/12/2016   Subjective: Patient  sitting up on the bedside with some complaints of  lightheadedness. Patient denies any chest pain. No shortness of breath. Patient denies any further blurry vision. No headaches. Patient noted to be hypotensive this morning.  Objective: Vitals:   04/13/16 1140 04/13/16 1144 04/13/16 1441 04/13/16 1848  BP: (!) 95/59 100/60 96/62 114/71  Pulse: 64  60 65  Resp: 16  18 18   Temp: 98.9 F (37.2 C)  98.3 F (36.8 C) 98.1 F (36.7 C)  TempSrc: Oral  Oral Oral  SpO2: 100%  99% 98%  Weight:      Height:       No intake or output data in the 24 hours ending 04/13/16 1928 Filed Weights   04/12/16 1527 04/12/16 2121  Weight: (S) 71.9 kg (158 lb 9.6 oz) 74.6 kg (164 lb 7.4 oz)    Examination:  General exam: Appears calm and comfortable  Respiratory system: Clear to auscultation. Respiratory effort normal. Cardiovascular system: S1 & S2 heard, RRR. No JVD, murmurs, rubs, gallops or clicks. No pedal edema. Gastrointestinal system: Abdomen is nondistended, soft and nontender. No organomegaly or masses felt. Normal bowel sounds heard. Central nervous system: Alert and oriented. No focal neurological deficits. Extremities: Symmetric 5 x 5 power. Skin: No rashes, lesions or ulcers Psychiatry: Judgement and insight appear normal. Mood & affect appropriate.     Data Reviewed: I have personally reviewed following labs and imaging studies  CBC:  Recent Labs Lab 04/12/16 1524 04/12/16 1538 04/13/16 0438  WBC 4.2  --  4.3  NEUTROABS 2.2  --   --   HGB 11.2* 12.2 9.7*  HCT 35.3* 36.0 30.3*  MCV 88.3  --  87.8  PLT 268  --  [id number]   Basic Metabolic Panel:  Recent Labs Lab 04/12/16 1524 04/12/16 1538 04/13/16 0438  NA 135 138 135  K 4.2 4.2 3.8  CL 107 105 107  CO2 21*  --  20*  GLUCOSE 100* 96 106*  BUN 17 18 15   CREATININE 1.43* 1.40* 1.17*  CALCIUM 9.0  --  8.3*   GFR: Estimated Creatinine Clearance: 36.8 mL/min (by C-G formula based on SCr of 1.17 mg/dL). Liver Function Tests:  Recent Labs Lab 04/12/16 1524  AST 21    ALT 10*  ALKPHOS 54  BILITOT 1.1  PROT 7.2  ALBUMIN 3.4*   No results for input(s): LIPASE, AMYLASE in the last 168 hours. No results for input(s): AMMONIA in the last 168 hours. Coagulation Profile:  Recent Labs Lab 04/12/16 1524  INR 1.28   Cardiac Enzymes:  Recent Labs Lab 04/12/16 2115 04/13/16 0438 04/13/16 1351  TROPONINI 0.11* 0.11* 0.09*   BNP (last 3 results) No results for input(s): PROBNP in the last 8760 hours. HbA1C: No results for input(s): HGBA1C in the last 72 hours. CBG:  Recent Labs Lab 04/12/16 1524  GLUCAP 102*   Lipid Profile:  Recent Labs  04/13/16 0438  CHOL 90  HDL 30*  LDLCALC 46  TRIG 69  CHOLHDL 3.0   Thyroid Function Tests: No results for input(s): TSH, T4TOTAL, FREET4, T3FREE, THYROIDAB in the last 72 hours. Anemia Panel: No results for input(s): VITAMINB12, FOLATE, FERRITIN, TIBC, IRON, RETICCTPCT in the last 72 hours. Sepsis Labs: No results for input(s): PROCALCITON, LATICACIDVEN in the last 168 hours.  Recent Results (from the past 240 hour(s))  Urine culture     Status: Abnormal   Collection Time: 04/12/16  5:30 PM  Result Value Ref Range Status   Specimen Description  URINE, RANDOM  Final   Special Requests NONE  Final   Culture MULTIPLE SPECIES PRESENT, SUGGEST RECOLLECTION (A)  Final   Report Status 04/13/2016 FINAL  Final         Radiology Studies: Ct Angio Head W/cm &/or Wo Cm  Result Date: 04/13/2016 CLINICAL DATA:  Bilateral blurry vision yesterday. History of diabetes, hypertension, atrial fibrillation and stroke. EXAM: CT ANGIOGRAPHY HEAD AND NECK TECHNIQUE: Multidetector CT imaging of the head and neck was performed using the standard protocol during bolus administration of intravenous contrast. Multiplanar CT image reconstructions and MIPs were obtained to evaluate the vascular anatomy. Carotid stenosis measurements (when applicable) are obtained utilizing NASCET criteria, using the distal internal  carotid diameter as the denominator. CONTRAST:  50 cc Isovue 370 COMPARISON:  CT HEAD April 12, 2016 FINDINGS: CT HEAD INTRACRANIAL CONTENTS: The ventricles and sulci are normal for age. No intraparenchymal hemorrhage, mass effect nor midline shift. Patchy supratentorial white matter hypodensities are less than expected for patient's age and though non-specific likely represent chronic small vessel ischemic disease. No acute large vascular territory infarcts. No abnormal extra-axial fluid collections. Basal cisterns are patent. Moderate calcific atherosclerosis of the carotid siphons. ORBITS: The included ocular globes and orbital contents are non-suspicious. Status post bilateral ocular lens implants. SINUSES: The mastoid aircells and included paranasal sinuses are well-aerated. SKULL/SOFT TISSUES: No skull fracture. No significant soft tissue swelling. Scout view demonstrates RIGHT shoulder arthroplasty. 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 CTA NECK AORTIC ARCH: Normal appearance of the thoracic arch, normal branch pattern. Mild calcific atherosclerosis of the aortic arch. The origins of the innominate, left Common carotid artery and subclavian artery are widely patent. RIGHT CAROTID SYSTEM: Common carotid artery is widely patent, coursing in a straight line fashion. Normal appearance of the carotid bifurcation without hemodynamically significant stenosis by NASCET criteria. Trace calcific atherosclerosis. Normal appearance of the included internal carotid artery. LEFT CAROTID SYSTEM: Common carotid artery is widely patent, coursing in a straight line fashion. Normal appearance of the carotid bifurcation without hemodynamically significant stenosis by NASCET criteria. Trace calcific atherosclerosis. Normal appearance of the included internal carotid artery. VERTEBRAL ARTERIES:Left vertebral artery is dominant. Normal appearance of the vertebral arteries, which appear widely patent.  SKELETON: No acute osseous process though bone windows have not been submitted. Multiple absent teeth. Moderate to severe C4-5 through C6-7 disc height loss, uncovertebral hypertrophy compatible with degenerative disc. Moderate to severe upper LEFT cervical facet arthropathy. No destructive bony lesions. OTHER NECK: Soft tissues of the neck are non-acute though, not tailored for evaluation. RIGHT retropectoral lymphadenopathy measure up to 12 mm short axis. Trace suspected LEFT pleural effusion. Cardiac pacemaker via LEFT subclavian venous approach. Sub cm RIGHT thyroid nodule below size followup recommendations. Patulous esophagus with air-fluid level partially imaged. CTA HEAD ANTERIOR CIRCULATION: Normal appearance of the cervical internal carotid arteries, petrous, cavernous and supra clinoid internal carotid arteries. Widely patent anterior communicating artery. Normal appearance of the anterior and middle cerebral arteries. No large vessel occlusion, hemodynamically significant stenosis, dissection, luminal irregularity, contrast extravasation or aneurysm. POSTERIOR CIRCULATION: Normal appearance of the vertebral arteries, vertebrobasilar junction and basilar artery, as well as main branch vessels. Normal appearance of the posterior cerebral arteries. No large vessel occlusion, hemodynamically significant stenosis, dissection, luminal irregularity, contrast extravasation or aneurysm. VENOUS SINUSES: Major dural venous sinuses are patent though not tailored for evaluation on this angiographic examination. ANATOMIC VARIANTS: None. DELAYED PHASE: No abnormal intracranial enhancement. IMPRESSION: CTA NECK: No hemodynamically significant stenosis or acute  vascular process. RIGHT retropectoral lymphadenopathy partially imaged. Recommend correlation with mammography on a nonemergent basis in dedicated follow-up. Patulous esophagus with air-fluid level presents aspiration risk. CTA HEAD: Negative. Electronically  Signed   By: Elon Alas M.D.   On: 04/13/2016 19:02   Ct Angio Neck W/cm &/or Wo/cm  Result Date: 04/13/2016 CLINICAL DATA:  Bilateral blurry vision yesterday. History of diabetes, hypertension, atrial fibrillation and stroke. EXAM: CT ANGIOGRAPHY HEAD AND NECK TECHNIQUE: Multidetector CT imaging of the head and neck was performed using the standard protocol during bolus administration of intravenous contrast. Multiplanar CT image reconstructions and MIPs were obtained to evaluate the vascular anatomy. Carotid stenosis measurements (when applicable) are obtained utilizing NASCET criteria, using the distal internal carotid diameter as the denominator. CONTRAST:  50 cc Isovue 370 COMPARISON:  CT HEAD April 12, 2016 FINDINGS: CT HEAD INTRACRANIAL CONTENTS: The ventricles and sulci are normal for age. No intraparenchymal hemorrhage, mass effect nor midline shift. Patchy supratentorial white matter hypodensities are less than expected for patient's age and though non-specific likely represent chronic small vessel ischemic disease. No acute large vascular territory infarcts. No abnormal extra-axial fluid collections. Basal cisterns are patent. Moderate calcific atherosclerosis of the carotid siphons. ORBITS: The included ocular globes and orbital contents are non-suspicious. Status post bilateral ocular lens implants. SINUSES: The mastoid aircells and included paranasal sinuses are well-aerated. SKULL/SOFT TISSUES: No skull fracture. No significant soft tissue swelling. Scout view demonstrates RIGHT shoulder arthroplasty. 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 CTA NECK AORTIC ARCH: Normal appearance of the thoracic arch, normal branch pattern. Mild calcific atherosclerosis of the aortic arch. The origins of the innominate, left Common carotid artery and subclavian artery are widely patent. RIGHT CAROTID SYSTEM: Common carotid artery is widely patent, coursing in a straight line  fashion. Normal appearance of the carotid bifurcation without hemodynamically significant stenosis by NASCET criteria. Trace calcific atherosclerosis. Normal appearance of the included internal carotid artery. LEFT CAROTID SYSTEM: Common carotid artery is widely patent, coursing in a straight line fashion. Normal appearance of the carotid bifurcation without hemodynamically significant stenosis by NASCET criteria. Trace calcific atherosclerosis. Normal appearance of the included internal carotid artery. VERTEBRAL ARTERIES:Left vertebral artery is dominant. Normal appearance of the vertebral arteries, which appear widely patent. SKELETON: No acute osseous process though bone windows have not been submitted. Multiple absent teeth. Moderate to severe C4-5 through C6-7 disc height loss, uncovertebral hypertrophy compatible with degenerative disc. Moderate to severe upper LEFT cervical facet arthropathy. No destructive bony lesions. OTHER NECK: Soft tissues of the neck are non-acute though, not tailored for evaluation. RIGHT retropectoral lymphadenopathy measure up to 12 mm short axis. Trace suspected LEFT pleural effusion. Cardiac pacemaker via LEFT subclavian venous approach. Sub cm RIGHT thyroid nodule below size followup recommendations. Patulous esophagus with air-fluid level partially imaged. CTA HEAD ANTERIOR CIRCULATION: Normal appearance of the cervical internal carotid arteries, petrous, cavernous and supra clinoid internal carotid arteries. Widely patent anterior communicating artery. Normal appearance of the anterior and middle cerebral arteries. No large vessel occlusion, hemodynamically significant stenosis, dissection, luminal irregularity, contrast extravasation or aneurysm. POSTERIOR CIRCULATION: Normal appearance of the vertebral arteries, vertebrobasilar junction and basilar artery, as well as main branch vessels. Normal appearance of the posterior cerebral arteries. No large vessel occlusion,  hemodynamically significant stenosis, dissection, luminal irregularity, contrast extravasation or aneurysm. VENOUS SINUSES: Major dural venous sinuses are patent though not tailored for evaluation on this angiographic examination. ANATOMIC VARIANTS: None. DELAYED PHASE: No abnormal intracranial enhancement. IMPRESSION: CTA NECK: No  hemodynamically significant stenosis or acute vascular process. RIGHT retropectoral lymphadenopathy partially imaged. Recommend correlation with mammography on a nonemergent basis in dedicated follow-up. Patulous esophagus with air-fluid level presents aspiration risk. CTA HEAD: Negative. Electronically Signed   By: Elon Alas M.D.   On: 04/13/2016 19:02   Ct Head Code Stroke Wo Contrast  Addendum Date: 04/12/2016   ADDENDUM REPORT: 04/12/2016 15:50 ADDENDUM: Study discussed by telephone with Dr. Roland Rack on 04/12/2016 at 1548 hours. Electronically Signed   By: Genevie Ann M.D.   On: 04/12/2016 15:50   Result Date: 04/12/2016 CLINICAL DATA:  Code stroke. 82 year old female with vision loss. Initial encounter. EXAM: CT HEAD WITHOUT CONTRAST TECHNIQUE: Contiguous axial images were obtained from the base of the skull through the vertex without intravenous contrast. COMPARISON:  Head CT without contrast 11/23/2015, and earlier. FINDINGS: Visualized paranasal sinuses and mastoids are stable and well pneumatized. No acute osseous abnormality identified. Stable and negative visualized orbits soft tissues. Visualized scalp soft tissues are within normal limits. Calcified atherosclerosis at the skull base. Cerebral volume remains normal for age. No midline shift, ventriculomegaly, mass effect, evidence of mass lesion, intracranial hemorrhage or evidence of cortically based acute infarction. Gray-white matter differentiation is within normal limits throughout the brain. No suspicious intracranial vascular hyperdensity. ASPECTS [facility] Stroke Program Early CT Score) Total score  (0-10 with 10 being normal): 10 IMPRESSION: 1. Stable and normal for age Normal noncontrast CT appearance of the brain. 2. ASPECTS is 10. Electronically Signed: By: Genevie Ann M.D. On: 04/12/2016 15:47        Scheduled Meds: . apixaban  5 mg Oral BID  . ciprofloxacin  400 mg Intravenous Q12H  . dorzolamide  1 drop Both Eyes BID  . timolol  1 drop Both Eyes Daily   Continuous Infusions:    LOS: 0 days    Time spent: 63 minutes    Kimara Bencomo, MD [facility] Pager [phone]  If 7PM-7AM, please contact night-coverage [email or web address] Password [facility] 04/13/2016, 7:28 PM

## 2016-04-13 NOTE — Progress Notes (Signed)
Physical Therapy Evaluation Patient Details Name: Kim Mullins MRN: [id number] DOB: 12/06/1929    Today's Date: 04/13/2016   History of Present Illness  pt is an 82 y/o female with pmh of CHF/NICM, complete heart block post pacer, Paroxysmal afib, prior CVA/TOA presented to the ED with sudden onset of blurry vision.  Symptoms improved in ED and TpA not given.  CT showed no acute abnormalities, unable to have MRI.  Clinical Impression  Pt admitted with/for blurred vision/ TIA.  Pt currently limited functionally due to the problems listed. ( See problems list.)   Pt will benefit from PT to maximize function and safety in order to get ready for next venue listed below.     Follow Up Recommendations Home health PT    Equipment Recommendations  None recommended by PT    Recommendations for Other Services       Precautions / Restrictions Precautions Precautions: Fall      Mobility  Bed Mobility Overal bed mobility: Needs Assistance Bed Mobility: Supine to Sit     Supine to sit: Supervision        Transfers Overall transfer level: Needs assistance   Transfers: Sit to/from Stand Sit to Stand: Min guard         General transfer comment: cues for hand placement.  mildly unsteady upon standing without AD  Ambulation/Gait Ambulation/Gait assistance: Min guard Ambulation Distance (Feet): 70 Feet Assistive device:  (iv pole) Gait Pattern/deviations: Step-through pattern Gait velocity: slower Gait velocity interpretation: Below normal speed for age/gender General Gait Details: mildly unsteady or paretic, but safe enough with iv pole  Stairs            Wheelchair Mobility    Modified Rankin (Stroke Patients Only)       Balance Overall balance assessment: Needs assistance Sitting-balance support: No upper extremity supported Sitting balance-Leahy Scale: Good     Standing balance support: Single extremity supported                                  Pertinent Vitals/Pain Pain Assessment: No/denies pain    Home Living Family/patient expects to be discharged to:: Private residence   Available Help at Discharge: Friend(s);Available PRN/intermittently Type of Home: House Home Access: Stairs to enter   Entrance Stairs-Number of Steps: 1 Home Layout: One level Home Equipment: Cane - single point;Bedside commode;Shower seat Additional Comments: Pt ever active, doctor of divinity (just received this), does yoga, water aerobics, book and bible club.    Prior Function Level of Independence: Independent with assistive device(s)         Comments: uses straight cane or RW dependent on need at the time     Hand Dominance   Dominant Hand: Right    Extremity/Trunk Assessment   Upper Extremity Assessment: Defer to OT evaluation           Lower Extremity Assessment: Generalized weakness;LLE deficits/detail   LLE Deficits / Details: left mildly weaker and less coordinated during MMT     Communication   Communication: No difficulties  Cognition Arousal/Alertness: Awake/alert Behavior During Therapy: WFL for tasks assessed/performed Overall Cognitive Status: Within Functional Limits for tasks assessed                      General Comments      Exercises        Assessment/Plan    PT Assessment Patient needs continued  PT services  PT Diagnosis Generalized weakness   PT Problem List Decreased strength;Decreased activity tolerance;Decreased balance;Decreased mobility;Decreased knowledge of use of DME  PT Treatment Interventions DME instruction;Gait training;Functional mobility training;Therapeutic activities;Patient/family education   PT Goals (Current goals can be found in the Care Plan section) Acute Rehab PT Goals Patient Stated Goal: back home PT Goal Formulation: With patient Time For Goal Achievement: 04/20/16 Potential to Achieve Goals: Good    Frequency Min 3X/week   Barriers to  discharge Decreased caregiver support      Co-evaluation               End of Session   Activity Tolerance: Patient tolerated treatment well Patient left: in bed;with family/visitor present;Other (comment) (sitting EOB) Nurse Communication: Mobility status    Functional Assessment Tool Used: clinical judgement Functional Limitation: Mobility: Walking and moving around Mobility: Walking and Moving Around Current Status [id number]): At least 1 percent but less than 20 percent impaired, limited or restricted Mobility: Walking and Moving Around Goal Status [phone]): 0 percent impaired, limited or restricted    Time: 1251-1313 PT Time Calculation (min) (ACUTE ONLY): 22 min   Charges:   PT Evaluation $PT Eval Moderate Complexity: 1 Procedure     PT G Codes:   PT G-Codes **NOT FOR INPATIENT CLASS** Functional Assessment Tool Used: clinical judgement Functional Limitation: Mobility: Walking and moving around Mobility: Walking and Moving Around Current Status [id number]): At least 1 percent but less than 20 percent impaired, limited or restricted Mobility: Walking and Moving Around Goal Status [phone]): 0 percent impaired, limited or restricted    [place] Doster, Tessie Fass 04/13/2016, 3:37 PM 04/13/2016  Donnella Sham, PT [phone]  (pager)

## 2016-04-13 NOTE — Progress Notes (Signed)
  Echocardiogram 2D Echocardiogram has been performed.  Jennette Dubin 04/13/2016, 10:25 AM

## 2016-04-13 NOTE — Progress Notes (Signed)
Pt ambulates standby assist with rolling walker to the bathroom. Pt requests to sit up on the side of the bed for meals.

## 2016-04-13 NOTE — Consult Note (Signed)
Advanced Heart Failure Team Consult Note  Referring Physician: Dr Grandville Silos PCP: Dr. Ayesha Rumpf Cardiology: Dr. Radford Pax HF Cardiology: Dr. Aundra Dubin  Reason for Consultation: Systolic HF/ TIAs / BP   HPI:    82 yo with history of chronic systolic CHF/nonischemic cardiomyopathy, prior complete heart block s/p Medtronic CRT-P, OSA, and paroxysmal atrial fibrillation. In 4/16, EF was up to 50-55%.  However, repeat echo in 6/17 has shown EF down to 15-20% with RV dysfunction.    Last seen in HF clinic 03/29/16. Thought to be stable. Lasix decreased.   Admitted 04/12/16 with blurry vision and generalized weakness. Pt had similar symptoms over the weekend in [place] along with expressive aphasia.   TPA not given with transient relief of symptoms.    Had similar symptoms 05/2015 with CVA/TIA. CT with no acute changes. Pertinent labs on admission include K 4.2, Creatinine 1.4.  Per Dr. Grandville Silos. Neurology has stated most likely cause is most likely transient ischemia due to poor blood flow with systolic CHF.   And recommended BP medication adjustment.   BP noted to be as low as 72/50 this admission.   Echo 04/13/16 LVEF 40-45%. MD will need to review with large jump in EF over 2 months.   Feeling better today. Symptoms resolved spontaneously yesterday. Has not been lightheaded or dizzy. She states she takes all of her medications as directed and she has not missed ANY doses of Eliquis.   BP at home in 80-90s.   SOB with mild/moderate exertion at baseline. No bleeding on eliquis. Got somewhat SOB walking 30-40 feet with PT.   Review of Systems: [y] = yes, [ ]  = no   General: Weight gain [ ] ; Weight loss [ ] ; Anorexia [ ] ; Fatigue [y]; Fever [ ] ; Chills [ ] ; Weakness [y]  Cardiac: Chest pain/pressure [ ] ; Resting SOB [ ] ; Exertional SOB [y]; Orthopnea [ ] ; Pedal Edema [ ] ; Palpitations [ ] ; Syncope [ ] ; Presyncope [ ] ; Paroxysmal nocturnal dyspnea[ ]   Pulmonary: Cough [ ] ; Wheezing[ ] ; Hemoptysis[ ] ;  Sputum [ ] ; Snoring [ ]   GI: Vomiting[ ] ; Dysphagia[ ] ; Melena[ ] ; Hematochezia [ ] ; Heartburn[ ] ; Abdominal pain [ ] ; Constipation [ ] ; Diarrhea [ ] ; BRBPR [ ]   GU: Hematuria[ ] ; Dysuria [ ] ; Nocturia[ ]   Vascular: Pain in legs with walking [ ] ; Pain in feet with lying flat [ ] ; Non-healing sores [ ] ; Stroke [ ] ; TIA [ ] ; Slurred speech [ ] ;  Neuro: Headaches[ ] ; Vertigo[ ] ; Seizures[ ] ; Paresthesias[ ] ;Blurred vision [y]; Diplopia [ ] ; Vision changes [y]  Ortho/Skin: Arthritis [ ] ; Joint pain [ ] ; Muscle pain [ ] ; Joint swelling [ ] ; Back Pain [ ] ; Rash [ ]   Psych: Depression[ ] ; Anxiety[ ]   Heme: Bleeding problems [ ] ; Clotting disorders [ ] ; Anemia [ ]   Endocrine: Diabetes [ ] ; Thyroid dysfunction[ ]   Home Medications Prior to Admission medications   Medication Sig Start Date End Date Taking? Authorizing Provider  Acetaminophen (TYLENOL) 325 MG CAPS Take 325 mg by mouth daily as needed (for shoulder pain).    Yes Historical Provider, MD  apixaban (ELIQUIS) 2.5 MG TABS tablet Take 2.5 mg by mouth 2 (two) times daily.   Yes Historical Provider, MD  Ascorbic Acid (VITAMIN C PO) Take 1 tablet by mouth daily. Reported on 12/19/2015   Yes Historical Provider, MD  CALCIUM PO Take 1 tablet by mouth daily.   Yes Historical Provider, MD  carvedilol (COREG) 6.25 MG tablet  Take 1 tablet (6.25 mg total) by mouth 2 (two) times daily with a meal. 03/19/16  Yes Larey Dresser, MD  diclofenac sodium (VOLTAREN) 1 % GEL Apply 1 application topically 3 (three) times daily as needed (pain, inflammation).  04/23/15  Yes Historical Provider, MD  diphenhydramine-acetaminophen (TYLENOL PM) 25-500 MG TABS tablet Take 1 tablet by mouth at bedtime as needed (sleep).    Yes Historical Provider, MD  dorzolamide (TRUSOPT) 2 % ophthalmic solution Place 1 drop into both eyes 2 (two) times daily.  07/30/15  Yes Historical Provider, MD  furosemide (LASIX) 40 MG tablet Take 22m (1 tablet) in the AM and 281m(1/2 tablet) in the  PM 03/29/16  Yes DaLarey DresserMD  lisinopril (PRINIVIL,ZESTRIL) 10 MG tablet Take 1 tablet (10 mg total) by mouth daily. 02/09/16  Yes TrSueanne MargaritaMD  spironolactone (ALDACTONE) 25 MG tablet Take 0.5 tablets (12.5 mg total) by mouth at bedtime. 03/19/16  Yes DaLarey DresserMD  timolol (TIMOPTIC) 0.5 % ophthalmic solution Place 1 drop into both eyes daily.  07/29/15  Yes Historical Provider, MD  Bisacodyl (LAXATIVE PO) Take 1 tablet by mouth daily as needed (for constipation).     Historical Provider, MD    Past Medical History: Past Medical History:  Diagnosis Date  . Anemia    occassionally  . Arthritis    all over.  . Asthma    Allergixc reaction to cats.  . Atrial fibrillation (HCS[place]  . Bowel obstruction (HC[place]  . Chronic systolic CHF (congestive heart failure) (HCC)    EF 30-35% by cath, echo 2016 EF 50%  . Complete heart block (HCC)    a. s/p MDT CRTP pacemaker  . DCM (dilated cardiomyopathy) (HCBrooklyn12/18/2015   normal coronary arteries on cath with EF 30-45%.  EF now 50% by echo 11/2014  . Diabetes mellitus 2006   Diet and exercise controlled.  . Marland KitchenERD (gastroesophageal reflux disease)    occ  . Hypertension   . Left tibial fracture 2007  . OSA (obstructive sleep apnea) 10/23/2014   Moderate with AHI 21/hr  . Osteoarthritis of right shoulder region 06/26/2013  . [facility]    Past Surgical History: Past Surgical History:  Procedure Laterality Date  . ABDOMINAL HYSTERECTOMY  1969  . BI-VENTRICULAR PACEMAKER INSERTION N/A 10/07/2014   MDT CRTP implanted by Dr TaLovena Le. BRAVO [place]UD[place] N/A 06/11/2014   Procedure: BRAVO [place]reens[place]UDY;  Surgeon: RoInda CastleMD;  [facility];  Service: Endoscopy;  Laterality: N/A;  . BUNIONECTOMY  1984   Bilateral  . CARDIAC CATHETERIZATION     normal coronary arteries  . Carpal Tunnell  2004   Bilateral  . CATARACT EXTRACTION Right    early 2015  . corn removal  1999   Bilateral feet  . DILATION AND CURETTAGE OF  UTERUS  1968  . ESOPHAGOGASTRODUODENOSCOPY (EGD) WITH PROPOFOL N/A 06/11/2014   Procedure: ESOPHAGOGASTRODUODENOSCOPY (EGD) WITH PROPOFOL;  Surgeon: RoInda CastleMD;  Location: [facility];  Service: Endoscopy;  Laterality: N/A;  . EYMcClelland Surgery to fix Retainal detachment, bilateral  . JOINT REPLACEMENT    . LEFT AND RIGHT HEART CATHETERIZATION WITH CORONARY ANGIOGRAM N/A 08/29/2014   Procedure: LEFT AND RIGHT HEART CATHETERIZATION WITH CORONARY ANGIOGRAM;  Surgeon: Peter M JoMartiniqueMD;  [facility];  Service: Cardiovascular;  Laterality: N/A;  . MALONEY DILATION  06/11/2014   Procedure: MALONEY DILATION;  Surgeon: Inda Castle, MD;  Location: [facility];  Service: Endoscopy;;  . PILONIDAL CYST EXCISION  1959  . TONSILLECTOMY  1942  . TOTAL HIP ARTHROPLASTY  11/16/2011   Procedure: TOTAL HIP ARTHROPLASTY ANTERIOR APPROACH;  Surgeon: Hessie Dibble, MD;  Location: [facility];  Service: Orthopedics;  Laterality: Right;  DEPUY  . TOTAL SHOULDER ARTHROPLASTY Right 06/26/2013   Procedure: TOTAL SHOULDER ARTHROPLASTY;  Surgeon: Johnny Bridge, MD;  Location: [place];  Service: Orthopedics;  Laterality: Right;    Family History: Family History  Problem Relation Age of Onset  . Dementia Mother   . Coronary artery disease Mother   . Cancer Father     blood ? type  . Diabetes Maternal Grandfather   . Anuerysm Daughter     brain  . Colon cancer Neg Hx     Social History: Social History   Social History  . Marital status: Widowed    Spouse name: N/A  . Number of children: 1  . Years of education: N/A   Occupational History  . retired    Social History Main Topics  . Smoking status: Former Smoker    Packs/day: 1.00    Years: 45.00    Types: Cigarettes    Quit date: 07/24/1978  . Smokeless tobacco: Never Used  . Alcohol use 4.2 oz/week    7 Glasses of wine per week     Comment: every day  . Drug use: No  . Sexual activity: Not Currently   Other  Topics Concern  . None   Social History Narrative  . None    Allergies:  Allergies  Allergen Reactions  . Hydrocodone Other (See Comments)    Took away all energy and made her stop eating food for short time  . Percocet [Oxycodone-Acetaminophen] Other (See Comments)    Too away all energy and made her stop eating food for short time  . Eggs Or Egg-Derived Products Hives and Rash  . Mercurial Derivatives Itching and Rash  . Penicillins Hives and Rash      . Quinine Derivatives Hives and Rash    Objective:    Vital Signs:   Temp:  [97.1 F (36.2 C)-99.2 F (37.3 C)] 98.9 F (37.2 C) (08/15 1140) Pulse Rate:  [53-71] 64 (08/15 1140) Resp:  [14-23] 16 (08/15 1140) BP: (72-131)/(45-91) 100/60 (08/15 1144) SpO2:  [97 %-100 %] 100 % (08/15 1140) Weight:  [158 lb 9.6 oz (71.9 kg)-164 lb 7.4 oz (74.6 kg)] 164 lb 7.4 oz (74.6 kg) (08/14 2121) Last BM Date: 04/13/16  Weight change: Filed Weights   04/12/16 1527 04/12/16 2121  Weight: (S) 158 lb 9.6 oz (71.9 kg) 164 lb 7.4 oz (74.6 kg)    Intake/Output:   Intake/Output Summary (Last 24 hours) at 04/13/16 1317 Last data filed at 04/12/16 1810  Gross per 24 hour  Intake             1000 ml  Output                0 ml  Net             1000 ml     Physical Exam: General: NAD Neck: JVP 8-9 cm, no thyromegaly or thyroid nodule.  Lungs: CTAB, normal effort.  CV: Nondisplaced PMI.  Heart regular S1/S2, no S3/S4, no murmur.  No peripheral edema.  No carotid bruit.  Normal pedal pulses.  Abdomen: Soft, NT, ND, no HSM. No bruits or masses. +BS  Skin: Intact without lesions or rashes.  Neurologic: Alert and oriented x 3.  Psych: Normal affect. Extremities: No clubbing or cyanosis.  HEENT: Normal.   Telemetry: Reviewed personally, NSR  Labs: Basic Metabolic Panel:  Recent Labs Lab 04/12/16 1524 04/12/16 1538 04/13/16 0438  NA 135 138 135  K 4.2 4.2 3.8  CL 107 105 107  CO2 21*  --  20*  GLUCOSE 100* 96 106*  BUN  17 18 15   CREATININE 1.43* 1.40* 1.17*  CALCIUM 9.0  --  8.3*    Liver Function Tests:  Recent Labs Lab 04/12/16 1524  AST 21  ALT 10*  ALKPHOS 54  BILITOT 1.1  PROT 7.2  ALBUMIN 3.4*   No results for input(s): LIPASE, AMYLASE in the last 168 hours. No results for input(s): AMMONIA in the last 168 hours.  CBC:  Recent Labs Lab 04/12/16 1524 04/12/16 1538 04/13/16 0438  WBC 4.2  --  4.3  NEUTROABS 2.2  --   --   HGB 11.2* 12.2 9.7*  HCT 35.3* 36.0 30.3*  MCV 88.3  --  87.8  PLT 268  --  235    Cardiac Enzymes:  Recent Labs Lab 04/12/16 2115 04/13/16 0438  TROPONINI 0.11* 0.11*    BNP: BNP (last 3 results)  Recent Labs  03/26/16 0901  BNP 304.3*    ProBNP (last 3 results) No results for input(s): PROBNP in the last 8760 hours.   CBG:  Recent Labs Lab 04/12/16 1524  GLUCAP 102*    Coagulation Studies:  Recent Labs  04/12/16 1524  LABPROT 16.1*  INR 1.28    Other results: EKG: NSR  Imaging: Ct Head Code Stroke Wo Contrast  Addendum Date: 04/12/2016   ADDENDUM REPORT: 04/12/2016 15:50 ADDENDUM: Study discussed by telephone with Dr. Roland Rack on 04/12/2016 at 1548 hours. Electronically Signed   By: Genevie Ann M.D.   On: 04/12/2016 15:50   Result Date: 04/12/2016 CLINICAL DATA:  Code stroke. 82 year old female with vision loss. Initial encounter. EXAM: CT HEAD WITHOUT CONTRAST TECHNIQUE: Contiguous axial images were obtained from the base of the skull through the vertex without intravenous contrast. COMPARISON:  Head CT without contrast 11/23/2015, and earlier. FINDINGS: Visualized paranasal sinuses and mastoids are stable and well pneumatized. No acute osseous abnormality identified. Stable and negative visualized orbits soft tissues. Visualized scalp soft tissues are within normal limits. Calcified atherosclerosis at the skull base. Cerebral volume remains normal for age. No midline shift, ventriculomegaly, mass effect, evidence of  mass lesion, intracranial hemorrhage or evidence of cortically based acute infarction. Gray-white matter differentiation is within normal limits throughout the brain. No suspicious intracranial vascular hyperdensity. ASPECTS [facility] Stroke Program Early CT Score) Total score (0-10 with 10 being normal): 10 IMPRESSION: 1. Stable and normal for age Normal noncontrast CT appearance of the brain. 2. ASPECTS is 10. Electronically Signed: By: Genevie Ann M.D. On: 04/12/2016 15:47     Medications:     Current Medications: . apixaban  2.5 mg Oral BID  . ciprofloxacin  400 mg Intravenous Q12H  . dorzolamide  1 drop Both Eyes BID  . timolol  1 drop Both Eyes Daily    Infusions: . sodium chloride 50 mL/hr at 04/13/16 0656     Assessment/Plan  82 yo with history of chronic systolic CHF/nonischemic cardiomyopathy, prior complete heart block, OSA, and paroxysmal atrial fibrillation who presented to [facility] 04/12/16 with TIA.   1. TIA - despite apixipan 2.5 mg dose.  However, looking back  she has chronically met criteria for Apixiban 5 mg BID dosing.  - CT with no acute changes.  - Neuro thinks some component hypotension/poor perfusion.   - BP meds on hold for now.  2. Chronic systolic CHF: Nonischemic cardiomyopathy, no coronary disease on cath in 12/15. Etiology uncertain => ?viral myocarditis.  She does not remember a family history of cardiomyopathy and has never drunk ETOH heavily.  LV function initially improved in 2016, but EF back down to 15-20% with RV dysfunction on 6/17 echo.   Arlyce Harman, Lisinopril on hold with hypotension and TIAs.  - Hold coreg for now as well. - Has CRT device, cannot get MRI.  3. Complete heart block:  - s/p Medtronic CRT-P.  Can't get MRI.  4. Atrial fibrillation: Paroxysmal - NSR today by EKG.  - CHADSVASC = 6   - Increase eliquis to 5 mg BID.  It's likely that she has been under-dosed.   Length of Stay: 0  Shirley Friar PA-C 04/13/2016, 1:17  PM  Advanced Heart Failure Team Pager [phone] (M-F; 7a - 4p)  Please contact [facility] for night-coverage after hours (4p -7a ) and weekends on [email or web address]  Patient seen with PA, agree with the above note.   1. TIA versus CVA: Visual symptoms resolved.  Possibly related to hypoperfusion with BP running on the low side but also may be related to underdosing of Eliquis.   - Plan for CTA head/neck today to reassess for CVA and to assess carotids.  - Increase Eliquis to 5 mg bid (this should be correct dosing for this patient).  - Off BP-active meds for now, SBP in 90s.  2. Chronic systolic CHF: Nonischemic cardiomyopathy.  EF up to 40-45% on echo this admission, from 15-20% on prior echo. She is now off Coreg, lisinopril, and spironolactone with soft BP.  Currently, SBP in 90s.  Lasix has been held and IV fluid started. On exam, JVP mildly elevated.  - Hold Coreg, lisinopril, spironolactone for now.  - Stop IV fluid, she appears to be mildly volume overloaded.  May start her back on po Lasix tomorrow.  Weight is rising.   - Has Medtronic CRT-P device.  3. Atrial fibrillation: Paroxysmal.  NSR.  As above, increase Eliquis to 5 mg bid.   [name] 04/13/2016 1:41 PM

## 2016-04-13 NOTE — Progress Notes (Signed)
**  Preliminary report by tech**  Carotid duplex completed. Findings are consistent with a 1-39 percent stenosis involving the right internal carotid artery and the left internal carotid artery. The vertebral arteries demonstrate antegrade flow.  04/13/16 9:42 AM Kim Mullins RVT

## 2016-04-13 NOTE — Progress Notes (Signed)
Occupational Therapy Evaluation Patient Details Name: Kim Mullins MRN: [id number] DOB: [birth year] Today's Date: 04/13/2016    History of Present Illness pt is an 82 y/o female with pmh of CHF/NICM, complete heart block post pacer, Paroxysmal afib, prior CVA/TOA presented to the ED with sudden onset of blurry vision.  Symptoms improved in ED and TpA not given.  CT showed no acute abnormalities, unable to have MRI.   Clinical Impression   PTA, pt lived alone and was independent with ADL and mobility. Pt states she has had 1 fall recently and is actively involved in a "balance program". Pt describes her vision as having a "veil" over a portion of her vision. States it appears worse this pm than earlier today. Recommend pt D/C home with intermittent S and HHOT. Given pt's description of recent visual changes, recommend pt follow up with her eye doctor and have a full field visual assessment, as pt is currently driving. Will follow acutely to facilitate safe D/C home.     Follow Up Recommendations  Home health OT;Supervision - Intermittent  Full Field Visual Assessment by pt's eye doctor   Equipment Recommendations  None recommended by OT    Recommendations for Other Services       Precautions / Restrictions Precautions Precautions: Fall      Mobility Bed Mobility Overal bed mobility: Needs Assistance Bed Mobility: Supine to Sit     Supine to sit: Supervision        Transfers Overall transfer level: Needs assistance   Transfers: Sit to/from Stand Sit to Stand: Min guard         General transfer comment: cues for hand placement.  mildly unsteady upon standing without AD    Balance Overall balance assessment: Needs assistance Sitting-balance support: No upper extremity supported Sitting balance-Leahy Scale: Fair     Standing balance support: Single extremity supported                                ADL Overall ADL's : Needs  assistance/impaired     Grooming: Set up;Sitting   Upper Body Bathing: Set up;Sitting   Lower Body Bathing: Min guard;Sit to/from stand   Upper Body Dressing : Set up;Sitting   Lower Body Dressing: Min guard;Sit to/from stand   Toilet Transfer: Min guard;Ambulation   Toileting- Clothing Manipulation and Hygiene: Supervision/safety;Sitting/lateral lean       Functional mobility during ADLs: Min guard General ADL Comments: Good safety awareness during mobility     Vision Vision Assessment?: Yes Eye Alignment: Within Functional Limits Ocular Range of Motion: Within Functional Limits Alignment/Gaze Preference: Within Defined Limits Tracking/Visual Pursuits: Decreased smoothness of horizontal tracking Saccades: Decreased speed of saccadic movement Convergence: Within functional limits Visual Fields: Impaired-to be further tested in functional context Additional Comments: Pt appears to have greater difficulty wtih L uper quadrant. States " I can see it but its like there is a veil over it"   Perception Perception Comments: Appears intact. Will further assess   Praxis Praxis Praxis tested?: Within functional limits    Pertinent Vitals/Pain Pain Assessment: No/denies pain     Hand Dominance Right   Extremity/Trunk Assessment Upper Extremity Assessment Upper Extremity Assessment: Generalized weakness (hx of arthritis in B shoulders)   Lower Extremity Assessment Lower Extremity Assessment: Defer to PT evaluation LLE Deficits / Details: left mildly weaker and less coordinated during MMT   Cervical / Trunk Assessment Cervical / Trunk Assessment:  Normal   Communication Communication Communication: No difficulties   Cognition Arousal/Alertness: Awake/alert Behavior During Therapy: WFL for tasks assessed/performed Overall Cognitive Status: Within Functional Limits for tasks assessed                     General Comments       Exercises       Shoulder  Instructions      Home Living Family/patient expects to be discharged to:: Private residence Living Arrangements: Alone Available Help at Discharge: Friend(s);Available PRN/intermittently Type of Home: House Home Access: Stairs to enter [employer] of Steps: 1   Home Layout: One level     Bathroom Shower/Tub: Tub/shower unit;Curtain;Other (comment) Shower/tub characteristics: Curtain Bathroom Toilet: Standard Bathroom Accessibility: Yes How Accessible: Accessible via walker Home Equipment: Cane - single point;Bedside commode;Shower seat;Toilet riser   Additional Comments: Pt very active, doctor of divinity (just received this), does yoga, water aerobics, book and bible club.  Lives With: Alone    Prior Functioning/Environment Level of Independence: Independent with assistive device(s)        Comments: uses straight cane or RW dependent on need at the time    OT Diagnosis: Generalized weakness;Disturbance of vision   OT Problem List: Decreased activity tolerance;Impaired vision/perception;Impaired balance (sitting and/or standing)   OT Treatment/Interventions: Self-care/ADL training;Therapeutic exercise;Energy conservation;DME and/or AE instruction;Therapeutic activities;Visual/perceptual remediation/compensation;Patient/family education;Balance training    OT Goals(Current goals can be found in the care plan section) Acute Rehab OT Goals Patient Stated Goal: back home OT Goal Formulation: With patient Time For Goal Achievement: 04/27/16 Potential to Achieve Goals: Good ADL Goals Pt Will Perform Lower Body Dressing: with set-up;sit to/from stand Pt Will Transfer to Toilet: with modified independence;ambulating;grab bars;regular height toilet Pt Will Perform Toileting - Clothing Manipulation and hygiene: with modified independence;sitting/lateral leans Additional ADL Goal #1: Pt will verbalize understanding of 3 compensatory techniques to increase contrast and  lighting and reduce slutter to iincrease safety adn functional vision  OT Frequency: Min 2X/week   Barriers to D/C:            Co-evaluation              End of Session Equipment Utilized During Treatment: Gait belt Nurse Communication: Mobility status  Activity Tolerance: Patient tolerated treatment well Patient left: in bed;with call bell/phone within reach;with bed alarm set;with family/visitor present   Time: 1445-1514 OT Time Calculation (min): 29 min Charges:  OT General Charges $OT Visit: 1 Procedure OT Evaluation $OT Eval Moderate Complexity: 1 Procedure OT Treatments $Self Care/Home Management : 8-22 mins G-Codes: OT G-codes **NOT FOR INPATIENT CLASS** Functional Assessment Tool Used: clinical judgement Functional Limitation: Self care Self Care Current Status [id number]): At least 1 percent but less than 20 percent impaired, limited or restricted Self Care Goal Status [id number]): At least 1 percent but less than 20 percent impaired, limited or restricted  [facility] 04/13/2016, 3:56 PM   [facility], OTR/L  [phone] 04/13/2016

## 2016-04-13 NOTE — Evaluation (Signed)
Speech Language Pathology Evaluation Patient Details Name: Kim Mullins MRN: [id number] DOB: 08-07-1930 Today's Date: 04/13/2016 Time: [id number] SLP Time Calculation (min) (ACUTE ONLY): 11 min  Problem List:  Patient Active Problem List   Diagnosis Date Noted  . TIA (transient ischemic attack) 04/12/2016  . UTI (lower urinary tract infection) 04/12/2016  . AKI (acute kidney injury) ([place]) 04/12/2016  . Atrial fibrillation ([place]) 09/12/2015  . Occipital infarction ([place])   . CVA (cerebral infarction) 06/18/2015  . Homonymous hemianopsia   . Pacemaker 01/21/2015  . OSA (obstructive sleep apnea) 10/23/2014  . Dizziness 10/21/2014  . Complete heart block ([place]) 10/06/2014  . Benign essential HTN 10/04/2014  . Chronic systolic CHF (congestive heart failure) ([place]) 10/04/2014  . Abnormal cardiovascular stress test 08/27/2014  . Pulmonary HTN ([place]) 08/16/2014  . DCM (dilated cardiomyopathy) ([place]) 08/16/2014  . Cough 07/13/2014  . Esophageal stricture 06/11/2014  . Nonspecific (abnormal) findings on radiological and other examination of gastrointestinal tract 05/29/2014  . Dyspnea 04/29/2014  . Osteoarthritis of right shoulder region 06/26/2013  . DJD (degenerative joint disease) of hip 11/16/2011    Class: Present on Admission   Past Medical History:  Past Medical History:  Diagnosis Date  . Anemia    occassionally  . Arthritis    all over.  . Asthma    Allergixc reaction to cats.  . Atrial fibrillation ([place])   . Bowel obstruction ([place])   . Chronic systolic CHF (congestive heart failure) ([facility])    EF 30-35% by cath, echo 2016 EF 50%  . Complete heart block ([facility])    a. s/p MDT CRTP pacemaker  . DCM (dilated cardiomyopathy) ([place]) 08/16/2014   normal coronary arteries on cath with EF 30-45%.  EF now 50% by echo 11/2014  . Diabetes mellitus 2006   Diet and exercise controlled.  Marland Kitchen GERD (gastroesophageal reflux disease)    occ  . Hypertension   . Left tibial fracture 2007   . OSA (obstructive sleep apnea) 10/23/2014   Moderate with AHI 21/hr  . Osteoarthritis of right shoulder region 06/26/2013  . Stroke [facility])    Past Surgical History:  Past Surgical History:  Procedure Laterality Date  . ABDOMINAL HYSTERECTOMY  1969  . BI-VENTRICULAR PACEMAKER INSERTION N/A 10/07/2014   MDT CRTP implanted by Dr Lovena Le  . BRAVO [place] STUDY N/A 06/11/2014   Procedure: BRAVO [place] STUDY;  Surgeon: Inda Castle, MD;  Location: [facility] ENDOSCOPY;  Service: Endoscopy;  Laterality: N/A;  . BUNIONECTOMY  1984   Bilateral  . CARDIAC CATHETERIZATION     normal coronary arteries  . Carpal [place]  2004   Bilateral  . CATARACT EXTRACTION Right    early 2015  . corn removal  1999   Bilateral feet  . DILATION AND CURETTAGE OF UTERUS  1968  . ESOPHAGOGASTRODUODENOSCOPY (EGD) WITH PROPOFOL N/A 06/11/2014   Procedure: ESOPHAGOGASTRODUODENOSCOPY (EGD) WITH PROPOFOL;  Surgeon: Inda Castle, MD;  Location: [facility] ENDOSCOPY;  Service: Endoscopy;  Laterality: N/A;  . [place]   Surgery to fix Retainal detachment, bilateral  . JOINT REPLACEMENT    . LEFT AND RIGHT HEART CATHETERIZATION WITH CORONARY ANGIOGRAM N/A 08/29/2014   Procedure: LEFT AND RIGHT HEART CATHETERIZATION WITH CORONARY ANGIOGRAM;  Surgeon: Peter M Martinique, MD;  Location[facility] CATH LAB;  Service: Cardiovascular;  Laterality: N/A;  . MALONEY DILATION  06/11/2014   Procedure: Venia Minks DILATION;  Surgeon: Inda Castle, MD;  Location: [facility] ENDOSCOPY;  Service: Endoscopy;;  . PILONIDAL CYST EXCISION  [place]  . TOTAL HIP ARTHROPLASTY  11/16/2011   Procedure: TOTAL HIP ARTHROPLASTY ANTERIOR APPROACH;  Surgeon: Hessie Dibble, MD;  Location: [place];  Service: Orthopedics;  Laterality: Right;  DEPUY  . TOTAL SHOULDER ARTHROPLASTY Right 06/26/2013   Procedure: TOTAL SHOULDER ARTHROPLASTY;  Surgeon: Johnny Bridge, MD;  Location: [place];  Service: Orthopedics;  Laterality: Right;   HPI:  Patient admitted  04/12/16 with blurry vision and generalized weakness. Patient had similar symptoms over the weekend in [place] along with expressive aphasia.  Stroke work up on going and orders received for cognitive-linguistic evaluation.    Assessment / Plan / Recommendation Clinical Impression  Patient demonstrates skills consistent with a score of 21/22 in the [facility] Cognitive Assessment, MoCA-Blind which is considered normal.  Speech is fully intelligible and language is intact.  No further skilled SLP services are warranted at this time.       SLP Assessment  Patient does not need any further Speech Lanaguage Pathology Services    Follow Up Recommendations  None               SLP Evaluation Prior Functioning  Cognitive/Linguistic Baseline: Within functional limits Type of Home: House  Lives With: Alone Available Help at Discharge: Friend(s);Available PRN/intermittently Vocation: Retired   Associate Professor  Overall Cognitive Status: Within Functional Limits for tasks assessed    Comprehension  Auditory Comprehension Overall Auditory Comprehension: Appears within functional limits for tasks assessed Visual Recognition/Discrimination Discrimination: Within Function Limits Reading Comprehension Reading Status: Within funtional limits    Expression Expression Primary Mode of Expression: Verbal Verbal Expression Overall Verbal Expression: Appears within functional limits for tasks assessed Written Expression Dominant Hand: Right Written Expression: Within Functional Limits   Oral / Motor  Oral Motor/Sensory Function Overall Oral Motor/Sensory Function: Within functional limits Motor Speech Overall Motor Speech: Appears within functional limits for tasks assessed   GO          Functional Assessment Tool Used: skilled clinical judgement  Functional Limitations: Spoken language expressive Spoken Language Expression Current Status [id number]): 0 percent impaired, limited or restricted Spoken  Language Expression Goal Status [id number]): 0 percent impaired, limited or restricted Spoken Language Expression Discharge Status [phone]): 0 percent impaired, limited or restricted         Gunnar Fusi, M.A., CCC-SLP [phone]  Ceira Hoeschen 04/13/2016, 1:37 PM

## 2016-04-13 NOTE — Progress Notes (Signed)
STROKE TEAM PROGRESS NOTE   HISTORY OF PRESENT ILLNESS (per record) Kim Mullins is a 82 y.o. female who was in her normal state earlier today at which time she noticed that her left upper visual field became impaired. She had a stroke previously and had this deficit with her previous stroke, but states that it gotten significantly better and then suddenly became much worse at that time. She also notes that about a week ago, she had a transient episode of garbled speech which improved. She went to a hospital in [place] at that time who diagnosed her with urinary tract infection and discharged her. She was last known well at 1420 on 04/12/2016. Patient was not administered IV t-PA secondary to mild symptoms. She was admitted for further evaluation and treatment.   SUBJECTIVE (INTERVAL HISTORY) Patient lying in bed. No family at bedside. Complains of left lower quadrant visual field blank trouble 1-2 hours last night. Symptoms all resolved. Had transient slurred speech one week ago in [place]. Had all stroke in 2016 with flashing lights all over the visual field. Not able to MRI/MRA, will do CTA head and neck. Noticed patient blood pressure very low, discontinue lisinopril, and discussed with Dr. Grandville Silos regarding blood pressure control, and recommend cardiology consult due to low EF and low blood pressure on multiple diuretics.   OBJECTIVE Temp:  [97.1 F (36.2 C)-99.2 F (37.3 C)] 98.9 F (37.2 C) (08/15 1140) Pulse Rate:  [53-71] 64 (08/15 1140) Cardiac Rhythm: A-V Sequential paced (08/15 0701) Resp:  [14-23] 16 (08/15 1140) BP: (72-131)/(45-91) 100/60 (08/15 1144) SpO2:  [97 %-100 %] 100 % (08/15 1140) Weight:  [71.9 kg (158 lb 9.6 oz)-74.6 kg (164 lb 7.4 oz)] 74.6 kg (164 lb 7.4 oz) (08/14 2121)  CBC:  Recent Labs Lab 04/12/16 1524 04/12/16 1538 04/13/16 0438  WBC 4.2  --  4.3  NEUTROABS 2.2  --   --   HGB 11.2* 12.2 9.7*  HCT 35.3* 36.0 30.3*  MCV 88.3  --  87.8  PLT 268   --  [id number]    Basic Metabolic Panel:  Recent Labs Lab 04/12/16 1524 04/12/16 1538 04/13/16 0438  NA 135 138 135  K 4.2 4.2 3.8  CL 107 105 107  CO2 21*  --  20*  GLUCOSE 100* 96 106*  BUN 17 18 15   CREATININE 1.43* 1.40* 1.17*  CALCIUM 9.0  --  8.3*    Lipid Panel:    Component Value Date/Time   CHOL 90 04/13/2016 0438   TRIG 69 04/13/2016 0438   HDL 30 (L) 04/13/2016 0438   CHOLHDL 3.0 04/13/2016 0438   VLDL 14 04/13/2016 0438   LDLCALC 46 04/13/2016 0438   HgbA1c:  Lab Results  Component Value Date   HGBA1C 5.8 (H) 06/19/2015   Urine Drug Screen:    Component Value Date/Time   LABOPIA NONE DETECTED 04/12/2016 1730   COCAINSCRNUR NONE DETECTED 04/12/2016 1730   LABBENZ NONE DETECTED 04/12/2016 1730   AMPHETMU NONE DETECTED 04/12/2016 1730   THCU NONE DETECTED 04/12/2016 1730   LABBARB NONE DETECTED 04/12/2016 1730      IMAGING I have personally reviewed the radiological images below and agree with the radiology interpretations.  Ct Head Code Stroke Wo Contrast 04/12/2016 1. Stable and normal for age Normal noncontrast CT appearance of the brain. 2. ASPECTS is 10.   Carotid Doppler   There is 1-39% bilateral ICA stenosis. Vertebral artery flow is antegrade.    2-D echo - Left  ventricle: The cavity size was normal. There was moderate   concentric hypertrophy. Systolic function was mildly to   moderately reduced. The estimated ejection fraction was in the   range of 40% to 45%. Diffuse hypokinesis. The study is not   technically sufficient to allow evaluation of LV diastolic   function. - Mitral valve: There was mild regurgitation. - Left atrium: The atrium was mildly dilated. - Right atrium: The atrium was mildly dilated. Impressions: - No cardiac source of emboli was indentified.  CTA head and neck  IMPRESSION: CTA NECK: No hemodynamically significant stenosis or acute vascular process. RIGHT retropectoral lymphadenopathy partially imaged. Recommend  correlation with mammography on a nonemergent basis in dedicated follow-up. Patulous esophagus with air-fluid level presents aspiration risk. CTA HEAD: Negative.     PHYSICAL EXAM General - Well nourished, well developed, in no apparent distress.  Ophthalmologic - Fundi not visualized due to eye movement.  Cardiovascular - Regular rate and rhythm.  Mental Status -  Level of arousal and orientation to time, place, and person were intact. Language including expression, naming, repetition, comprehension was assessed and found intact. Fund of Knowledge was assessed and was intact.  Cranial Nerves II - XII - II - Visual field intact OU. III, IV, VI - Extraocular movements intact. V - Facial sensation intact bilaterally. VII - Facial movement intact bilaterally. VIII - Hearing & vestibular intact bilaterally. X - Palate elevates symmetrically. XI - Chin turning & shoulder shrug intact bilaterally. XII - Tongue protrusion intact.  Motor Strength - The patient's strength was normal in all extremities and pronator drift was absent.  Bulk was normal and fasciculations were absent.   Motor Tone - Muscle tone was assessed at the neck and appendages and was normal.  Reflexes - The patient's reflexes were 1+ in all extremities and she had no pathological reflexes.  Sensory - Light touch, temperature/pinprick were assessed and were symmetrical.    Coordination - The patient had normal movements in the hands and feet with no ataxia or dysmetria.  Tremor was absent.  Gait and Station - not tested due to safety concerns.    ASSESSMENT/PLAN [name] is a 82 y.o. female with history of chronic systolic CHF/nonischemic cardiomyopathy, complete heart block status post pacemaker placement, paroxysmal atrial fibrillation on chronic anticoagulation, history of CHF last EF 15-20% right ventricular dysfunction on echo of June 2017, prior history of CVA/TIA presenting with visual field cut.  She did not receive IV t-PA due to mild symptoms.   TIA - likely due to hypo-perfusion and hypotension, or under dosing of eliquis  MRI  / MRA   pacemaker  CT angiography head and neck unremarkable  Carotid Doppler No significant stenosis   2D Echo  EF 40-45%  LDL 46, goal < 70  HgbA1c pending  SCDs for VTE prophylaxis  Diet Heart Room service appropriate? Yes; Fluid consistency: Thin  Eliquis (apixaban) daily 2.5 bid prior to admission, now on Eliquis (apixaban) daily 5 mg bid based on dosing criteria.  Patient counseled to be compliant with her antithrombotic medications  Ongoing aggressive stroke risk factor management  Therapy recommendations:  Pending  Disposition:  Pending (home alone PTA)  Paroxysmal Atrial Fibrillation  New onset PAF since her last stroke in Oct 2016  Home anticoagulation:  Eliquis (apixaban) daily 2.5 bid  Elqiuis dose increased in the hospital to 5 mg bid, likely underdosed  Continue eliquis at discharge  Hypotension   As BP at 70s - 90s  Related to CHF and the right use   Recommend cardiology consult for BP meds adjustment and CHF treatment   TTE EF 40-45%, much improved from before    Diabetes type II  HgbA1c pending, goal < 7.0  CBG controlled  Other Stroke Risk Factors  Advanced age  Former Cigarette smoker, advised to stop smoking  ETOH use, advised to drink no more than 1 drink(s) a day  Overweight, Body mass index is 27.37 kg/m., recommend weight loss, diet and exercise as appropriate   Hx stroke/TIA  05/2015 L brain infarct s/p IV tPA (not seen on CT, could not have MRI)  Obstructive sleep apnea, on CPAP at home  Chronic systolic CHF/nonischemic cardiomyopathy, EF 15-20%, now improved to 40-45%  Other Active Problems  Complete heart block status post biventricular pacemaker placement  Recent UTI  Acute kidney injury  Hospital day # 0  Neurology will sign off. Please call with questions. Pt will  follow up with Dr. Erlinda Hong at [facility] in about 2 months. Thanks for the consult.  Rosalin Hawking, MD PhD Stroke Neurology 04/13/2016 8:24 PM   To contact Stroke Continuity provider, please refer to [email or web address] After hours, contact General Neurology

## 2016-04-13 NOTE — Progress Notes (Signed)
OT Cancellation Note  Patient Details Name: Kim Mullins MRN: [id number] DOB: 03-06-1930   Cancelled Treatment:    Reason Eval/Treat Not Completed: Patient at procedure or test/ unavailable  [place], OTR/L  [id number] 04/13/2016 04/13/2016, 9:56 AM

## 2016-04-13 NOTE — Care Management Note (Signed)
Case Management Note  Patient Details  Name: Kim Mullins MRN: [id number] Date of Birth: 11-20-1929  Subjective/Objective:    Pt in with TIA. CTH was negative and awaiting CT angio of Head and neck. Pt unable to have MRI d/t pacer. She is from home alone.                 Action/Plan: Awaiting PT/OT recommendations. CM following for discharge disposition.   Expected Discharge Date:                  Expected Discharge Plan:     In-House Referral:     Discharge planning Services     Post Acute Care Choice:    Choice offered to:     DME Arranged:    DME Agency:     HH Arranged:    HH Agency:     Status of Service:  In process, will continue to follow  If discussed at Long Length of Stay Meetings, dates discussed:    Additional Comments:  Pollie Friar, RN 04/13/2016, 12:26 PM

## 2016-04-14 DIAGNOSIS — I959 Hypotension, unspecified: Secondary | ICD-10-CM | POA: Diagnosis present

## 2016-04-14 DIAGNOSIS — E860 Dehydration: Secondary | ICD-10-CM | POA: Diagnosis present

## 2016-04-14 DIAGNOSIS — G458 Other transient cerebral ischemic attacks and related syndromes: Secondary | ICD-10-CM | POA: Diagnosis not present

## 2016-04-14 DIAGNOSIS — I442 Atrioventricular block, complete: Secondary | ICD-10-CM | POA: Diagnosis present

## 2016-04-14 DIAGNOSIS — I480 Paroxysmal atrial fibrillation: Secondary | ICD-10-CM

## 2016-04-14 DIAGNOSIS — N179 Acute kidney failure, unspecified: Secondary | ICD-10-CM | POA: Diagnosis present

## 2016-04-14 DIAGNOSIS — Z9841 Cataract extraction status, right eye: Secondary | ICD-10-CM | POA: Diagnosis not present

## 2016-04-14 DIAGNOSIS — E119 Type 2 diabetes mellitus without complications: Secondary | ICD-10-CM | POA: Diagnosis present

## 2016-04-14 DIAGNOSIS — Z8673 Personal history of transient ischemic attack (TIA), and cerebral infarction without residual deficits: Secondary | ICD-10-CM | POA: Diagnosis not present

## 2016-04-14 DIAGNOSIS — I952 Hypotension due to drugs: Secondary | ICD-10-CM | POA: Diagnosis present

## 2016-04-14 DIAGNOSIS — H547 Unspecified visual loss: Secondary | ICD-10-CM | POA: Diagnosis present

## 2016-04-14 DIAGNOSIS — Z90710 Acquired absence of both cervix and uterus: Secondary | ICD-10-CM | POA: Diagnosis not present

## 2016-04-14 DIAGNOSIS — Z950 Presence of cardiac pacemaker: Secondary | ICD-10-CM | POA: Diagnosis not present

## 2016-04-14 DIAGNOSIS — G450 Vertebro-basilar artery syndrome: Secondary | ICD-10-CM | POA: Diagnosis not present

## 2016-04-14 DIAGNOSIS — Z87891 Personal history of nicotine dependence: Secondary | ICD-10-CM | POA: Diagnosis not present

## 2016-04-14 DIAGNOSIS — N390 Urinary tract infection, site not specified: Secondary | ICD-10-CM | POA: Diagnosis not present

## 2016-04-14 DIAGNOSIS — G4733 Obstructive sleep apnea (adult) (pediatric): Secondary | ICD-10-CM | POA: Diagnosis present

## 2016-04-14 DIAGNOSIS — T50996A Underdosing of other drugs, medicaments and biological substances, initial encounter: Secondary | ICD-10-CM | POA: Diagnosis present

## 2016-04-14 DIAGNOSIS — Z96611 Presence of right artificial shoulder joint: Secondary | ICD-10-CM | POA: Diagnosis present

## 2016-04-14 DIAGNOSIS — H538 Other visual disturbances: Secondary | ICD-10-CM | POA: Diagnosis present

## 2016-04-14 DIAGNOSIS — I5022 Chronic systolic (congestive) heart failure: Secondary | ICD-10-CM | POA: Diagnosis not present

## 2016-04-14 DIAGNOSIS — I110 Hypertensive heart disease with heart failure: Secondary | ICD-10-CM | POA: Diagnosis present

## 2016-04-14 DIAGNOSIS — I420 Dilated cardiomyopathy: Secondary | ICD-10-CM | POA: Diagnosis present

## 2016-04-14 DIAGNOSIS — Z7901 Long term (current) use of anticoagulants: Secondary | ICD-10-CM | POA: Diagnosis not present

## 2016-04-14 DIAGNOSIS — T502X5A Adverse effect of carbonic-anhydrase inhibitors, benzothiadiazides and other diuretics, initial encounter: Secondary | ICD-10-CM | POA: Diagnosis present

## 2016-04-14 DIAGNOSIS — K219 Gastro-esophageal reflux disease without esophagitis: Secondary | ICD-10-CM | POA: Diagnosis present

## 2016-04-14 DIAGNOSIS — I272 Other secondary pulmonary hypertension: Secondary | ICD-10-CM | POA: Diagnosis present

## 2016-04-14 LAB — HEMOGLOBIN A1C
HEMOGLOBIN A1C: 5.7 % — AB (ref 4.8–5.6)
MEAN PLASMA GLUCOSE: 117 mg/dL

## 2016-04-14 NOTE — Progress Notes (Signed)
   04/14/16 0021  Vitals  Temp 98.5 F (36.9 C)  Temp Source Oral  BP 101/65  BP Location Left Arm  BP Method Automatic  Patient Position (if appropriate) Lying  Pulse Rate (!) 59  Pulse Rate Source Dinamap  Resp 18  Orthostatic Lying   BP- Lying 101/65  Orthostatic Sitting  BP- Sitting 102/63  Pulse- Sitting 63  Orthostatic Standing at 0 minutes  BP- Standing at 0 minutes 100/68  Pulse- Standing at 0 minutes 74  Orthostatic Standing at 3 minutes  BP- Standing at 3 minutes 114/69  Pulse- Standing at 3 minutes 73  Oxygen Therapy  SpO2 100 %  O2 Device Room Air     ORTHOSTATIC VS per order at 2044 (04/13/16)

## 2016-04-14 NOTE — Progress Notes (Signed)
Advanced Heart Failure Rounding Note  Referring Physician: Dr Grandville Silos PCP: Dr. Ayesha Rumpf Cardiology: Dr. Radford Pax HF Cardiology: Dr. Aundra Dubin  Reason for Consultation: Systolic HF/ TIAs / BP   Subjective:    Admitted 04/12/16 with TIA. Spontaneously resolved. Now off BP meds with hypotension. Diuretics held as well.   Feeling OK this morning. No further symptoms.  BPs remains soft in 90s.  Eliquis increased to 5 mg BID 04/13/16.  Objective:   Weight Range: 163 lb 12.8 oz (74.3 kg) Body mass index is 27.26 kg/m.   Vital Signs:   Temp:  [98.1 F (36.7 C)-98.7 F (37.1 C)] 98.1 F (36.7 C) (08/16 1011) Pulse Rate:  [59-80] 63 (08/16 1011) Resp:  [18] 18 (08/16 1011) BP: (90-114)/(56-74) 90/56 (08/16 1011) SpO2:  [93 %-100 %] 94 % (08/16 1011) Weight:  [163 lb 12.8 oz (74.3 kg)] 163 lb 12.8 oz (74.3 kg) (08/16 0547) Last BM Date: 04/13/16  Weight change: Filed Weights   04/12/16 1527 04/12/16 2121 04/14/16 0547  Weight: (S) 158 lb 9.6 oz (71.9 kg) 164 lb 7.4 oz (74.6 kg) 163 lb 12.8 oz (74.3 kg)    Intake/Output:   Intake/Output Summary (Last 24 hours) at 04/14/16 1237 Last data filed at 04/13/16 2315  Gross per 24 hour  Intake              200 ml  Output                0 ml  Net              200 ml     Physical Exam: General: NAD Neck: JVP remains 8-9 cm, no thyromegaly or thyroid nodule.  Lungs: Clear, NAD  CV: Nondisplaced PMI. Heart regular S1/S2, no S3/S4, no murmur. No peripheral edema. No carotid bruit. Normal pedal pulses.  Abdomen: Soft, NT, ND, no HSM. No bruits or masses. +BS  Skin: Intact without lesions or rashes.  Neurologic: Alert and oriented x 3.  Psych: Normal affect. Extremities: No clubbing or cyanosis.  HEENT: Normal.   Telemetry: Reviewed personally, NSR  Labs: CBC  Recent Labs  04/12/16 1524 04/12/16 1538 04/13/16 0438  WBC 4.2  --  4.3  NEUTROABS 2.2  --   --   HGB 11.2* 12.2 9.7*  HCT 35.3* 36.0 30.3*  MCV 88.3  --   87.8  PLT 268  --  [id number]   Basic Metabolic Panel  Recent Labs  04/12/16 1524 04/12/16 1538 04/13/16 0438  NA 135 138 135  K 4.2 4.2 3.8  CL 107 105 107  CO2 21*  --  20*  GLUCOSE 100* 96 106*  BUN 17 18 15   CREATININE 1.43* 1.40* 1.17*  CALCIUM 9.0  --  8.3*   Liver Function Tests  Recent Labs  04/12/16 1524  AST 21  ALT 10*  ALKPHOS 54  BILITOT 1.1  PROT 7.2  ALBUMIN 3.4*   No results for input(s): LIPASE, AMYLASE in the last 72 hours. Cardiac Enzymes  Recent Labs  04/12/16 2115 04/13/16 0438 04/13/16 1351  TROPONINI 0.11* 0.11* 0.09*    BNP: BNP (last 3 results)  Recent Labs  03/26/16 0901  BNP 304.3*    ProBNP (last 3 results) No results for input(s): PROBNP in the last 8760 hours.   D-Dimer No results for input(s): DDIMER in the last 72 hours. Hemoglobin A1C  Recent Labs  04/13/16 0438  HGBA1C 5.7*   Fasting Lipid Panel  Recent Labs  04/13/16  0438  CHOL 90  HDL 30*  LDLCALC 46  TRIG 69  CHOLHDL 3.0   Thyroid Function Tests No results for input(s): TSH, T4TOTAL, T3FREE, THYROIDAB in the last 72 hours.  Invalid input(s): FREET3  Other results:     Imaging/Studies:  Ct Angio Head W/cm &/or Wo Cm  Result Date: 04/13/2016 CLINICAL DATA:  Bilateral blurry vision yesterday. History of diabetes, hypertension, atrial fibrillation and stroke. EXAM: CT ANGIOGRAPHY HEAD AND NECK TECHNIQUE: Multidetector CT imaging of the head and neck was performed using the standard protocol during bolus administration of intravenous contrast. Multiplanar CT image reconstructions and MIPs were obtained to evaluate the vascular anatomy. Carotid stenosis measurements (when applicable) are obtained utilizing NASCET criteria, using the distal internal carotid diameter as the denominator. CONTRAST:  50 cc Isovue 370 COMPARISON:  CT HEAD April 12, 2016 FINDINGS: CT HEAD INTRACRANIAL CONTENTS: The ventricles and sulci are normal for age. No intraparenchymal  hemorrhage, mass effect nor midline shift. Patchy supratentorial white matter hypodensities are less than expected for patient's age and though non-specific likely represent chronic small vessel ischemic disease. No acute large vascular territory infarcts. No abnormal extra-axial fluid collections. Basal cisterns are patent. Moderate calcific atherosclerosis of the carotid siphons. ORBITS: The included ocular globes and orbital contents are non-suspicious. Status post bilateral ocular lens implants. SINUSES: The mastoid aircells and included paranasal sinuses are well-aerated. SKULL/SOFT TISSUES: No skull fracture. No significant soft tissue swelling. Scout view demonstrates RIGHT shoulder arthroplasty. 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 CTA NECK AORTIC ARCH: Normal appearance of the thoracic arch, normal branch pattern. Mild calcific atherosclerosis of the aortic arch. The origins of the innominate, left Common carotid artery and subclavian artery are widely patent. RIGHT CAROTID SYSTEM: Common carotid artery is widely patent, coursing in a straight line fashion. Normal appearance of the carotid bifurcation without hemodynamically significant stenosis by NASCET criteria. Trace calcific atherosclerosis. Normal appearance of the included internal carotid artery. LEFT CAROTID SYSTEM: Common carotid artery is widely patent, coursing in a straight line fashion. Normal appearance of the carotid bifurcation without hemodynamically significant stenosis by NASCET criteria. Trace calcific atherosclerosis. Normal appearance of the included internal carotid artery. VERTEBRAL ARTERIES:Left vertebral artery is dominant. Normal appearance of the vertebral arteries, which appear widely patent. SKELETON: No acute osseous process though bone windows have not been submitted. Multiple absent teeth. Moderate to severe C4-5 through C6-7 disc height loss, uncovertebral hypertrophy compatible with  degenerative disc. Moderate to severe upper LEFT cervical facet arthropathy. No destructive bony lesions. OTHER NECK: Soft tissues of the neck are non-acute though, not tailored for evaluation. RIGHT retropectoral lymphadenopathy measure up to 12 mm short axis. Trace suspected LEFT pleural effusion. Cardiac pacemaker via LEFT subclavian venous approach. Sub cm RIGHT thyroid nodule below size followup recommendations. Patulous esophagus with air-fluid level partially imaged. CTA HEAD ANTERIOR CIRCULATION: Normal appearance of the cervical internal carotid arteries, petrous, cavernous and supra clinoid internal carotid arteries. Widely patent anterior communicating artery. Normal appearance of the anterior and middle cerebral arteries. No large vessel occlusion, hemodynamically significant stenosis, dissection, luminal irregularity, contrast extravasation or aneurysm. POSTERIOR CIRCULATION: Normal appearance of the vertebral arteries, vertebrobasilar junction and basilar artery, as well as main branch vessels. Normal appearance of the posterior cerebral arteries. No large vessel occlusion, hemodynamically significant stenosis, dissection, luminal irregularity, contrast extravasation or aneurysm. VENOUS SINUSES: Major dural venous sinuses are patent though not tailored for evaluation on this angiographic examination. ANATOMIC VARIANTS: None. DELAYED PHASE: No abnormal intracranial enhancement. IMPRESSION: CTA  NECK: No hemodynamically significant stenosis or acute vascular process. RIGHT retropectoral lymphadenopathy partially imaged. Recommend correlation with mammography on a nonemergent basis in dedicated follow-up. Patulous esophagus with air-fluid level presents aspiration risk. CTA HEAD: Negative. Electronically Signed   By: Elon Alas M.D.   On: 04/13/2016 19:02   Ct Angio Neck W/cm &/or Wo/cm  Result Date: 04/13/2016 CLINICAL DATA:  Bilateral blurry vision yesterday. History of diabetes, hypertension,  atrial fibrillation and stroke. EXAM: CT ANGIOGRAPHY HEAD AND NECK TECHNIQUE: Multidetector CT imaging of the head and neck was performed using the standard protocol during bolus administration of intravenous contrast. Multiplanar CT image reconstructions and MIPs were obtained to evaluate the vascular anatomy. Carotid stenosis measurements (when applicable) are obtained utilizing NASCET criteria, using the distal internal carotid diameter as the denominator. CONTRAST:  50 cc Isovue 370 COMPARISON:  CT HEAD April 12, 2016 FINDINGS: CT HEAD INTRACRANIAL CONTENTS: The ventricles and sulci are normal for age. No intraparenchymal hemorrhage, mass effect nor midline shift. Patchy supratentorial white matter hypodensities are less than expected for patient's age and though non-specific likely represent chronic small vessel ischemic disease. No acute large vascular territory infarcts. No abnormal extra-axial fluid collections. Basal cisterns are patent. Moderate calcific atherosclerosis of the carotid siphons. ORBITS: The included ocular globes and orbital contents are non-suspicious. Status post bilateral ocular lens implants. SINUSES: The mastoid aircells and included paranasal sinuses are well-aerated. SKULL/SOFT TISSUES: No skull fracture. No significant soft tissue swelling. Scout view demonstrates RIGHT shoulder arthroplasty. 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 CTA NECK AORTIC ARCH: Normal appearance of the thoracic arch, normal branch pattern. Mild calcific atherosclerosis of the aortic arch. The origins of the innominate, left Common carotid artery and subclavian artery are widely patent. RIGHT CAROTID SYSTEM: Common carotid artery is widely patent, coursing in a straight line fashion. Normal appearance of the carotid bifurcation without hemodynamically significant stenosis by NASCET criteria. Trace calcific atherosclerosis. Normal appearance of the included internal carotid artery.  LEFT CAROTID SYSTEM: Common carotid artery is widely patent, coursing in a straight line fashion. Normal appearance of the carotid bifurcation without hemodynamically significant stenosis by NASCET criteria. Trace calcific atherosclerosis. Normal appearance of the included internal carotid artery. VERTEBRAL ARTERIES:Left vertebral artery is dominant. Normal appearance of the vertebral arteries, which appear widely patent. SKELETON: No acute osseous process though bone windows have not been submitted. Multiple absent teeth. Moderate to severe C4-5 through C6-7 disc height loss, uncovertebral hypertrophy compatible with degenerative disc. Moderate to severe upper LEFT cervical facet arthropathy. No destructive bony lesions. OTHER NECK: Soft tissues of the neck are non-acute though, not tailored for evaluation. RIGHT retropectoral lymphadenopathy measure up to 12 mm short axis. Trace suspected LEFT pleural effusion. Cardiac pacemaker via LEFT subclavian venous approach. Sub cm RIGHT thyroid nodule below size followup recommendations. Patulous esophagus with air-fluid level partially imaged. CTA HEAD ANTERIOR CIRCULATION: Normal appearance of the cervical internal carotid arteries, petrous, cavernous and supra clinoid internal carotid arteries. Widely patent anterior communicating artery. Normal appearance of the anterior and middle cerebral arteries. No large vessel occlusion, hemodynamically significant stenosis, dissection, luminal irregularity, contrast extravasation or aneurysm. POSTERIOR CIRCULATION: Normal appearance of the vertebral arteries, vertebrobasilar junction and basilar artery, as well as main branch vessels. Normal appearance of the posterior cerebral arteries. No large vessel occlusion, hemodynamically significant stenosis, dissection, luminal irregularity, contrast extravasation or aneurysm. VENOUS SINUSES: Major dural venous sinuses are patent though not tailored for evaluation on this angiographic  examination. ANATOMIC VARIANTS: None. DELAYED PHASE: No  abnormal intracranial enhancement. IMPRESSION: CTA NECK: No hemodynamically significant stenosis or acute vascular process. RIGHT retropectoral lymphadenopathy partially imaged. Recommend correlation with mammography on a nonemergent basis in dedicated follow-up. Patulous esophagus with air-fluid level presents aspiration risk. CTA HEAD: Negative. Electronically Signed   By: Elon Alas M.D.   On: 04/13/2016 19:02   Ct Head Code Stroke Wo Contrast  Addendum Date: 04/12/2016   ADDENDUM REPORT: 04/12/2016 15:50 ADDENDUM: Study discussed by telephone with Dr. Roland Rack on 04/12/2016 at 1548 hours. Electronically Signed   By: Genevie Ann M.D.   On: 04/12/2016 15:50   Result Date: 04/12/2016 CLINICAL DATA:  Code stroke. 82 year old female with vision loss. Initial encounter. EXAM: CT HEAD WITHOUT CONTRAST TECHNIQUE: Contiguous axial images were obtained from the base of the skull through the vertex without intravenous contrast. COMPARISON:  Head CT without contrast 11/23/2015, and earlier. FINDINGS: Visualized paranasal sinuses and mastoids are stable and well pneumatized. No acute osseous abnormality identified. Stable and negative visualized orbits soft tissues. Visualized scalp soft tissues are within normal limits. Calcified atherosclerosis at the skull base. Cerebral volume remains normal for age. No midline shift, ventriculomegaly, mass effect, evidence of mass lesion, intracranial hemorrhage or evidence of cortically based acute infarction. Gray-white matter differentiation is within normal limits throughout the brain. No suspicious intracranial vascular hyperdensity. ASPECTS [facility] Stroke Program Early CT Score) Total score (0-10 with 10 being normal): 10 IMPRESSION: 1. Stable and normal for age Normal noncontrast CT appearance of the brain. 2. ASPECTS is 10. Electronically Signed: By: Genevie Ann M.D. On: 04/12/2016 15:47     Latest  Echo  Latest Cath   Medications:     Scheduled Medications: . apixaban  5 mg Oral BID  . ciprofloxacin  400 mg Intravenous Q12H  . dorzolamide  1 drop Both Eyes BID  . timolol  1 drop Both Eyes Daily     Infusions:     PRN Medications:  acetaminophen, ondansetron, senna-docusate   Assessment/Plan  82 yo with history of chronic systolic CHF/nonischemic cardiomyopathy, prior complete heart block, OSA, and paroxysmal atrial fibrillation who presented to [facility] 04/12/16 with TIA.   1. TIA versus CVA: Visual symptoms resolved. ?hypoperfusion vs underdosing of Eliquis.  - CTA head/neck with unremarkable except for incidental retropectoral lymphadenopathy.  - Continue Eliquis to 5 mg bid (Dosed 04/13/16, Had been on 2.5 mg BID incorrectly) - Keep off BP meds for now.  2. Chronic systolic CHF: Nonischemic cardiomyopathy.  EF up to 40-45% on echo this admission, from 15-20% on prior echo. She is now off Coreg, lisinopril, and spironolactone with soft BP.  Currently, SBP in 90s.  Lasix has been held. On exam, JVP mildly elevated.  - Hold Coreg, lisinopril, spironolactone for now. Would eventually like to get back on at least some of these.  - Weight stable today.  Will resume her po lasix in am.  - Has [employer] CRT-P device.  3. Atrial fibrillation: Paroxysmal.  NSR.  - Continue  Eliquis to 5 mg bid.   Length of Stay: 0  Shirley Friar PA-C 04/14/2016, 12:37 PM  Advanced Heart Failure Team Pager [phone] (M-F; 7a - 4p)  Please contact [facility] for night-coverage after hours (4p -7a ) and weekends on [email or web address]  Patient seen with PA, agree with the above note.  SBP in 90s, no lightheadedness.  She remains off her BP-active meds.  I reviewed echo done this admission, EF indeed appears improved to 40-45% range.  Would aim to restart  po Lasix tomorrow am.  If BP a bit higher, would also add Coreg 3.125 mg bid.  Should be ready for home some it appears.   Needs to take  full dose Eliquis.   [name] 04/14/2016 5:09 PM

## 2016-04-14 NOTE — Progress Notes (Signed)
Patient Demographics:    Kim Mullins, is a 82 y.o. female, DOB - [birth year], [id number]  Admit date - 04/12/2016   Admitting Physician Eugenie Filler, MD  Outpatient Primary MD for the patient is Kristine Garbe, MD  LOS - 0   Chief Complaint  Patient presents with  . Dizziness  . Blurred Vision        Subjective:    Kim Mullins today has no fevers, no emesis,  No chest pain,  Without shortness of breath, no vomiting, no neuro concerns   Assessment  & Plan :    Principal Problem:   TIA (transient ischemic attack) Active Problems:   Osteoarthritis of right shoulder region   Dyspnea   Pulmonary HTN (HCC)   DCM (dilated cardiomyopathy) (HCC)   Benign essential HTN   Chronic systolic CHF (congestive heart failure) (HCC)   Complete heart block (HCC)   Pacemaker   CVA (cerebral infarction)   Atrial fibrillation (HCC)   UTI (lower urinary tract infection)   AKI (acute kidney injury) (HCC)   Hypotension due to drugs   Chronic anticoagulation   1)TIA-CTA head and neck negative for acute CVA, aggressive risk factor modification, Eliquis increased from 2.5 mg Twice a day to 5 mg twice a day which will be more appropriate dosing.   2)Non-ischemic Cardiomyopathy/systolic dysfunction CHF-much improved repeat echo with EF of 40 to 45 %, up from 15-25% in June 2017  3) Paroxysmal Afib- Coreg and antihypertensives on hold due to BP concerns, Eliquis increased to 5 mg twice a day as above in #1  4)Possible UTI-continue Cipro pending cultures, AKI secondary to UTI and dehydration, diuretics on hold  Code Status : Full   Disposition Plan  :  discharging a.m. pending urine cultures  Consults  :  Neurology and cardiology   DVT Prophylaxis  :  Eliquis  Lab Results  Component Value Date   PLT 235 04/13/2016    Inpatient Medications  Scheduled Meds: . apixaban  5 mg Oral BID    . ciprofloxacin  400 mg Intravenous Q12H  . dorzolamide  1 drop Both Eyes BID  . timolol  1 drop Both Eyes Daily   Continuous Infusions:  PRN Meds:.acetaminophen, ondansetron, senna-docusate    Anti-infectives    Start     Dose/Rate Route Frequency Ordered Stop   04/14/16 0000  ciprofloxacin (CIPRO) IVPB 400 mg  Status:  Discontinued     400 mg 200 mL/hr over 60 Minutes Intravenous Every 24 hours 04/12/16 2143 04/13/16 0944   04/13/16 1200  ciprofloxacin (CIPRO) IVPB 400 mg     400 mg 200 mL/hr over 60 Minutes Intravenous Every 12 hours 04/13/16 0944     04/12/16 2000  ciprofloxacin (CIPRO) IVPB 400 mg  Status:  Discontinued     400 mg 200 mL/hr over 60 Minutes Intravenous Every 12 hours 04/12/16 1942 04/12/16 2143        Objective:   Vitals:   04/14/16 0021 04/14/16 0547 04/14/16 1011 04/14/16 1349  BP: 101/65 107/74 (!) 90/56 95/63  Pulse: (!) 59 80 63 66  Resp: 18 18 18 18   Temp: 98.5 F (36.9 C) 98.6 F (37 C) 98.1 F (36.7 C) 98.2 F (36.8 C)  TempSrc: Oral  Oral Oral Oral  SpO2: 100% 93% 94% 100%  Weight:  74.3 kg (163 lb 12.8 oz)    Height:        Wt Readings from Last 3 Encounters:  04/14/16 74.3 kg (163 lb 12.8 oz)  03/29/16 72.8 kg (160 lb 8 oz)  03/24/16 72.1 kg (159 lb)     Intake/Output Summary (Last 24 hours) at 04/14/16 1640 Last data filed at 04/13/16 2315  Gross per 24 hour  Intake              200 ml  Output                0 ml  Net              200 ml     Physical Exam  Gen:- Awake Alert,   HEENT:- [place].AT, No sclera icterus Neck-Supple Neck,No JVD,.  Lungs-  CTAB  CV- S1, S2 normal Abd-  +ve B.Sounds, Abd Soft, No tenderness,   No CVA area tenderness  Extremity/Skin:- No  edema,   Neuro-no new neuro deficits   Data Review:   Micro Results Recent Results (from the past 240 hour(s))  Urine culture     Status: Abnormal   Collection Time: 04/12/16  5:30 PM  Result Value Ref Range Status   Specimen Description URINE, RANDOM   Final   Special Requests NONE  Final   Culture MULTIPLE SPECIES PRESENT, SUGGEST RECOLLECTION (A)  Final   Report Status 04/13/2016 FINAL  Final    Radiology Reports Ct Angio Head W/cm &/or Wo Cm  Result Date: 04/13/2016 CLINICAL DATA:  Bilateral blurry vision yesterday. History of diabetes, hypertension, atrial fibrillation and stroke. EXAM: CT ANGIOGRAPHY HEAD AND NECK TECHNIQUE: Multidetector CT imaging of the head and neck was performed using the standard protocol during bolus administration of intravenous contrast. Multiplanar CT image reconstructions and MIPs were obtained to evaluate the vascular anatomy. Carotid stenosis measurements (when applicable) are obtained utilizing NASCET criteria, using the distal internal carotid diameter as the denominator. CONTRAST:  50 cc Isovue 370 COMPARISON:  CT HEAD April 12, 2016 FINDINGS: CT HEAD INTRACRANIAL CONTENTS: The ventricles and sulci are normal for age. No intraparenchymal hemorrhage, mass effect nor midline shift. Patchy supratentorial white matter hypodensities are less than expected for patient's age and though non-specific likely represent chronic small vessel ischemic disease. No acute large vascular territory infarcts. No abnormal extra-axial fluid collections. Basal cisterns are patent. Moderate calcific atherosclerosis of the carotid siphons. ORBITS: The included ocular globes and orbital contents are non-suspicious. Status post bilateral ocular lens implants. SINUSES: The mastoid aircells and included paranasal sinuses are well-aerated. SKULL/SOFT TISSUES: No skull fracture. No significant soft tissue swelling. Scout view demonstrates RIGHT shoulder arthroplasty. 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 CTA NECK AORTIC ARCH: Normal appearance of the thoracic arch, normal branch pattern. Mild calcific atherosclerosis of the aortic arch. The origins of the innominate, left Common carotid artery and subclavian artery are  widely patent. RIGHT CAROTID SYSTEM: Common carotid artery is widely patent, coursing in a straight line fashion. Normal appearance of the carotid bifurcation without hemodynamically significant stenosis by NASCET criteria. Trace calcific atherosclerosis. Normal appearance of the included internal carotid artery. LEFT CAROTID SYSTEM: Common carotid artery is widely patent, coursing in a straight line fashion. Normal appearance of the carotid bifurcation without hemodynamically significant stenosis by NASCET criteria. Trace calcific atherosclerosis. Normal appearance of the included internal carotid artery. VERTEBRAL ARTERIES:Left vertebral artery is dominant. Normal appearance of  the vertebral arteries, which appear widely patent. SKELETON: No acute osseous process though bone windows have not been submitted. Multiple absent teeth. Moderate to severe C4-5 through C6-7 disc height loss, uncovertebral hypertrophy compatible with degenerative disc. Moderate to severe upper LEFT cervical facet arthropathy. No destructive bony lesions. OTHER NECK: Soft tissues of the neck are non-acute though, not tailored for evaluation. RIGHT retropectoral lymphadenopathy measure up to 12 mm short axis. Trace suspected LEFT pleural effusion. Cardiac pacemaker via LEFT subclavian venous approach. Sub cm RIGHT thyroid nodule below size followup recommendations. Patulous esophagus with air-fluid level partially imaged. CTA HEAD ANTERIOR CIRCULATION: Normal appearance of the cervical internal carotid arteries, petrous, cavernous and supra clinoid internal carotid arteries. Widely patent anterior communicating artery. Normal appearance of the anterior and middle cerebral arteries. No large vessel occlusion, hemodynamically significant stenosis, dissection, luminal irregularity, contrast extravasation or aneurysm. POSTERIOR CIRCULATION: Normal appearance of the vertebral arteries, vertebrobasilar junction and basilar artery, as well as main  branch vessels. Normal appearance of the posterior cerebral arteries. No large vessel occlusion, hemodynamically significant stenosis, dissection, luminal irregularity, contrast extravasation or aneurysm. VENOUS SINUSES: Major dural venous sinuses are patent though not tailored for evaluation on this angiographic examination. ANATOMIC VARIANTS: None. DELAYED PHASE: No abnormal intracranial enhancement. IMPRESSION: CTA NECK: No hemodynamically significant stenosis or acute vascular process. RIGHT retropectoral lymphadenopathy partially imaged. Recommend correlation with mammography on a nonemergent basis in dedicated follow-up. Patulous esophagus with air-fluid level presents aspiration risk. CTA HEAD: Negative. Electronically Signed   By: Elon Alas M.D.   On: 04/13/2016 19:02   Dg Chest 2 View  Result Date: 03/24/2016 CLINICAL DATA:  Shortness of breath and dizziness beginning this morning. EXAM: CHEST  2 VIEW COMPARISON:  PA and lateral chest 10/08/2014. FINDINGS: The lungs are clear. There is cardiomegaly. Pacing device is in place. No pneumothorax or pleural effusion. Aortic atherosclerosis is seen. Thoracic spondylosis noted. IMPRESSION: Cardiomegaly without acute disease. Atherosclerosis. Electronically Signed   By: Inge Rise M.D.   On: 03/24/2016 13:13  Ct Angio Neck W/cm &/or Wo/cm  Result Date: 04/13/2016 CLINICAL DATA:  Bilateral blurry vision yesterday. History of diabetes, hypertension, atrial fibrillation and stroke. EXAM: CT ANGIOGRAPHY HEAD AND NECK TECHNIQUE: Multidetector CT imaging of the head and neck was performed using the standard protocol during bolus administration of intravenous contrast. Multiplanar CT image reconstructions and MIPs were obtained to evaluate the vascular anatomy. Carotid stenosis measurements (when applicable) are obtained utilizing NASCET criteria, using the distal internal carotid diameter as the denominator. CONTRAST:  50 cc Isovue 370 COMPARISON:   CT HEAD April 12, 2016 FINDINGS: CT HEAD INTRACRANIAL CONTENTS: The ventricles and sulci are normal for age. No intraparenchymal hemorrhage, mass effect nor midline shift. Patchy supratentorial white matter hypodensities are less than expected for patient's age and though non-specific likely represent chronic small vessel ischemic disease. No acute large vascular territory infarcts. No abnormal extra-axial fluid collections. Basal cisterns are patent. Moderate calcific atherosclerosis of the carotid siphons. ORBITS: The included ocular globes and orbital contents are non-suspicious. Status post bilateral ocular lens implants. SINUSES: The mastoid aircells and included paranasal sinuses are well-aerated. SKULL/SOFT TISSUES: No skull fracture. No significant soft tissue swelling. Scout view demonstrates RIGHT shoulder arthroplasty. 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 CTA NECK AORTIC ARCH: Normal appearance of the thoracic arch, normal branch pattern. Mild calcific atherosclerosis of the aortic arch. The origins of the innominate, left Common carotid artery and subclavian artery are widely patent. RIGHT CAROTID SYSTEM: Common carotid artery  is widely patent, coursing in a straight line fashion. Normal appearance of the carotid bifurcation without hemodynamically significant stenosis by NASCET criteria. Trace calcific atherosclerosis. Normal appearance of the included internal carotid artery. LEFT CAROTID SYSTEM: Common carotid artery is widely patent, coursing in a straight line fashion. Normal appearance of the carotid bifurcation without hemodynamically significant stenosis by NASCET criteria. Trace calcific atherosclerosis. Normal appearance of the included internal carotid artery. VERTEBRAL ARTERIES:Left vertebral artery is dominant. Normal appearance of the vertebral arteries, which appear widely patent. SKELETON: No acute osseous process though bone windows have not been submitted.  Multiple absent teeth. Moderate to severe C4-5 through C6-7 disc height loss, uncovertebral hypertrophy compatible with degenerative disc. Moderate to severe upper LEFT cervical facet arthropathy. No destructive bony lesions. OTHER NECK: Soft tissues of the neck are non-acute though, not tailored for evaluation. RIGHT retropectoral lymphadenopathy measure up to 12 mm short axis. Trace suspected LEFT pleural effusion. Cardiac pacemaker via LEFT subclavian venous approach. Sub cm RIGHT thyroid nodule below size followup recommendations. Patulous esophagus with air-fluid level partially imaged. CTA HEAD ANTERIOR CIRCULATION: Normal appearance of the cervical internal carotid arteries, petrous, cavernous and supra clinoid internal carotid arteries. Widely patent anterior communicating artery. Normal appearance of the anterior and middle cerebral arteries. No large vessel occlusion, hemodynamically significant stenosis, dissection, luminal irregularity, contrast extravasation or aneurysm. POSTERIOR CIRCULATION: Normal appearance of the vertebral arteries, vertebrobasilar junction and basilar artery, as well as main branch vessels. Normal appearance of the posterior cerebral arteries. No large vessel occlusion, hemodynamically significant stenosis, dissection, luminal irregularity, contrast extravasation or aneurysm. VENOUS SINUSES: Major dural venous sinuses are patent though not tailored for evaluation on this angiographic examination. ANATOMIC VARIANTS: None. DELAYED PHASE: No abnormal intracranial enhancement. IMPRESSION: CTA NECK: No hemodynamically significant stenosis or acute vascular process. RIGHT retropectoral lymphadenopathy partially imaged. Recommend correlation with mammography on a nonemergent basis in dedicated follow-up. Patulous esophagus with air-fluid level presents aspiration risk. CTA HEAD: Negative. Electronically Signed   By: Elon Alas M.D.   On: 04/13/2016 19:02   Ct Head Code Stroke Wo  Contrast  Addendum Date: 04/12/2016   ADDENDUM REPORT: 04/12/2016 15:50 ADDENDUM: Study discussed by telephone with Dr. Roland Rack on 04/12/2016 at 1548 hours. Electronically Signed   By: Genevie Ann M.D.   On: 04/12/2016 15:50   Result Date: 04/12/2016 CLINICAL DATA:  Code stroke. 82 year old female with vision loss. Initial encounter. EXAM: CT HEAD WITHOUT CONTRAST TECHNIQUE: Contiguous axial images were obtained from the base of the skull through the vertex without intravenous contrast. COMPARISON:  Head CT without contrast 11/23/2015, and earlier. FINDINGS: Visualized paranasal sinuses and mastoids are stable and well pneumatized. No acute osseous abnormality identified. Stable and negative visualized orbits soft tissues. Visualized scalp soft tissues are within normal limits. Calcified atherosclerosis at the skull base. Cerebral volume remains normal for age. No midline shift, ventriculomegaly, mass effect, evidence of mass lesion, intracranial hemorrhage or evidence of cortically based acute infarction. Gray-white matter differentiation is within normal limits throughout the brain. No suspicious intracranial vascular hyperdensity. ASPECTS [facility] Stroke Program Early CT Score) Total score (0-10 with 10 being normal): 10 IMPRESSION: 1. Stable and normal for age Normal noncontrast CT appearance of the brain. 2. ASPECTS is 10. Electronically Signed: By: Genevie Ann M.D. On: 04/12/2016 15:47     CBC  Recent Labs Lab 04/12/16 1524 04/12/16 1538 04/13/16 0438  WBC 4.2  --  4.3  HGB 11.2* 12.2 9.7*  HCT 35.3* 36.0 30.3*  PLT 268  --  235  MCV 88.3  --  87.8  MCH 28.0  --  28.1  MCHC 31.7  --  32.0  RDW 13.8  --  13.9  LYMPHSABS 1.6  --   --   MONOABS 0.3  --   --   EOSABS 0.1  --   --   BASOSABS 0.0  --   --     Chemistries   Recent Labs Lab 04/12/16 1524 04/12/16 1538 04/13/16 0438  NA 135 138 135  K 4.2 4.2 3.8  CL 107 105 107  CO2 21*  --  20*  GLUCOSE 100* 96 106*  BUN 17  18 15   CREATININE 1.43* 1.40* 1.17*  CALCIUM 9.0  --  8.3*  AST 21  --   --   ALT 10*  --   --   ALKPHOS 54  --   --   BILITOT 1.1  --   --    ------------------------------------------------------------------------------------------------------------------  Recent Labs  04/13/16 0438  CHOL 90  HDL 30*  LDLCALC 46  TRIG 69  CHOLHDL 3.0    Lab Results  Component Value Date   HGBA1C 5.7 (H) 04/13/2016   ------------------------------------------------------------------------------------------------------------------ No results for input(s): TSH, T4TOTAL, T3FREE, THYROIDAB in the last 72 hours.  Invalid input(s): FREET3 ------------------------------------------------------------------------------------------------------------------ No results for input(s): VITAMINB12, FOLATE, FERRITIN, TIBC, IRON, RETICCTPCT in the last 72 hours.  Coagulation profile  Recent Labs Lab 04/12/16 1524  INR 1.28    No results for input(s): DDIMER in the last 72 hours.  Cardiac Enzymes  Recent Labs Lab 04/12/16 2115 04/13/16 0438 04/13/16 1351  TROPONINI 0.11* 0.11* 0.09*   ------------------------------------------------------------------------------------------------------------------    Component Value Date/Time   BNP 304.3 (H) 03/26/2016 0901     Kim Mullins M.D on 04/14/2016 at 4:40 PM  Between 7am to 7pm - Pager - [phone]  After 7pm go to [email or web address] - password [email or web address]  [facility] -  Office  [phone]  Dragon dictation system was used to create this note, attempts have been made to correct errors, however presence of uncorrected errors is not a reflection quality of care provided

## 2016-04-15 DIAGNOSIS — Y9342 Activity, yoga: Secondary | ICD-10-CM

## 2016-04-15 LAB — CBC WITH DIFFERENTIAL/PLATELET
Basophils Absolute: 0 10*3/uL (ref 0.0–0.1)
Basophils Relative: 1 %
EOS PCT: 4 %
Eosinophils Absolute: 0.2 10*3/uL (ref 0.0–0.7)
HCT: 33.2 % — ABNORMAL LOW (ref 36.0–46.0)
HEMOGLOBIN: 10.3 g/dL — AB (ref 12.0–15.0)
LYMPHS ABS: 1 10*3/uL (ref 0.7–4.0)
LYMPHS PCT: 23 %
MCH: 27.9 pg (ref 26.0–34.0)
MCHC: 31 g/dL (ref 30.0–36.0)
MCV: 90 fL (ref 78.0–100.0)
Monocytes Absolute: 0.4 10*3/uL (ref 0.1–1.0)
Monocytes Relative: 8 %
Neutro Abs: 3 10*3/uL (ref 1.7–7.7)
Neutrophils Relative %: 64 %
PLATELETS: 239 10*3/uL (ref 150–400)
RBC: 3.69 MIL/uL — AB (ref 3.87–5.11)
RDW: 14.1 % (ref 11.5–15.5)
WBC: 4.6 10*3/uL (ref 4.0–10.5)

## 2016-04-15 LAB — BASIC METABOLIC PANEL
ANION GAP: 7 (ref 5–15)
BUN: 9 mg/dL (ref 6–20)
CHLORIDE: 111 mmol/L (ref 101–111)
CO2: 21 mmol/L — ABNORMAL LOW (ref 22–32)
Calcium: 8.8 mg/dL — ABNORMAL LOW (ref 8.9–10.3)
Creatinine, Ser: 0.99 mg/dL (ref 0.44–1.00)
GFR calc non Af Amer: 51 mL/min — ABNORMAL LOW (ref >60)
GFR, EST AFRICAN AMERICAN: 59 mL/min — AB (ref >60)
GLUCOSE: 91 mg/dL (ref 65–99)
POTASSIUM: 4.2 mmol/L (ref 3.5–5.1)
SODIUM: 139 mmol/L (ref 135–145)

## 2016-04-15 MED ORDER — CARVEDILOL 3.125 MG PO TABS
3.1250 mg | ORAL_TABLET | Freq: Two times a day (BID) | ORAL | Status: DC
  Administered 2016-04-15 – 2016-04-16 (×2): 3.125 mg via ORAL
  Filled 2016-04-15 (×2): qty 1

## 2016-04-15 MED ORDER — CARVEDILOL 3.125 MG PO TABS: 3.1250 mg | ORAL_TABLET | Freq: Two times a day (BID) | ORAL | Status: DC

## 2016-04-15 MED ORDER — FUROSEMIDE 40 MG PO TABS
40.0000 mg | ORAL_TABLET | Freq: Every day | ORAL | Status: DC
Start: 2016-04-15 — End: 2016-04-16
  Administered 2016-04-15 – 2016-04-16 (×2): 40 mg via ORAL
  Filled 2016-04-15 (×2): qty 1

## 2016-04-15 NOTE — Congregational Nurse Program (Signed)
Congregational Nurse Program Note  Date of Encounter: 04/15/2016  Past Medical History: No past medical history on file.  Encounter Details:     CNP Questionnaire - 04/07/16 1103      Patient Demographics   Is this a new or existing patient? Existing   Patient is considered a/an Not Applicable   Race African-American/Black     Patient Assistance   Location of Patient Assistance Not Applicable   Patient's financial/insurance status Medicare   Uninsured Patient No   Patient referred to apply for the following financial assistance Not Applicable   Food insecurities addressed Not Applicable   Transportation assistance No   Assistance securing medications No   Educational health offerings Exercise/physical activity     Encounter Details   Primary purpose of visit Spiritual Care/Support Visit   Was an Emergency Department visit averted? Not Applicable   Does patient have a medical provider? Yes   Patient referred to Not Applicable   Was a mental health screening completed? (GAINS tool) No   Does patient have dental issues? No   Does patient have vision issues? No   Does your patient have an abnormal blood pressure today? No   Since previous encounter, have you referred patient for abnormal blood pressure that resulted in a new diagnosis or medication change? No   Does your patient have an abnormal blood glucose today? No   Since previous encounter, have you referred patient for abnormal blood glucose that resulted in a new diagnosis or medication change? No   Was there a life-saving intervention made? No      Attended chair yoga class.

## 2016-04-15 NOTE — Care Management Note (Signed)
Case Management Note  Patient Details  Name: MARYAM MONTVILLE MRN: [id number] Date of Birth: June 21, 1930  Subjective/Objective:                    Action/Plan: Per patient she was to start with outpatient cardiac rehab but has not received a call. CM called Dr Oleh Genin office but closed for the day. CM called outpatient Cardiac Rehab and they do have her listed and will call her to set up first appointment on Monday. CM will continue to follow.   Expected Discharge Date:                  Expected Discharge Plan:     In-House Referral:     Discharge planning Services     Post Acute Care Choice:    Choice offered to:     DME Arranged:    DME Agency:     HH Arranged:    HH Agency:     Status of Service:  In process, will continue to follow  If discussed at Long Length of Stay Meetings, dates discussed:    Additional Comments:  Pollie Friar, RN 04/15/2016, 4:54 PM

## 2016-04-15 NOTE — Discharge Instructions (Signed)

## 2016-04-15 NOTE — Progress Notes (Signed)
Patient Demographics:    Kim Mullins, is a 82 y.o. female, DOB - 03-May-1930, [id number]  Admit date - 04/12/2016   Admitting Physician Eugenie Filler, MD  Outpatient Primary MD for the patient is Kristine Garbe, MD  LOS - 1   Chief Complaint  Patient presents with  . Dizziness  . Blurred Vision        Subjective:    Kim Mullins today has no fevers, no emesis,  No chest pain,  No sob   Assessment  & Plan :    Principal Problem:   TIA (transient ischemic attack) Active Problems:   Osteoarthritis of right shoulder region   Dyspnea   Pulmonary HTN (HCC)   DCM (dilated cardiomyopathy) (HCC)   Benign essential HTN   Chronic systolic CHF (congestive heart failure) (HCC)   Complete heart block (HCC)   Pacemaker   CVA (cerebral infarction)   Atrial fibrillation (HCC)   UTI (lower urinary tract infection)   AKI (acute kidney injury) (HCC)   Hypotension due to drugs   Chronic anticoagulation   Hypotension   1)1)TIA-CTA head and neck negative for acute CVA, aggressive risk factor modification, c/n  Eliquis   5 mg twice a day   2)Non-ischemic Cardiomyopathy/systolic dysfunction CHF-much improved repeat echo with EF of 40 to 45 %, up from 15-25% in June 2017,Continue Coreg 3.125 mg twice a day  3) Paroxysmal Afib- Coreg and antihypertensives on hold due to BP concerns, Eliquis  as above in #1  4)Possible UTI-continue Cipro pending cultures, AKI secondary to UTI and dehydration, diuretics on hold,     Code Status : full    Disposition Plan  : home in am   Consults  :  Cardio/neuro   DVT Prophylaxis  :  eliquis  Lab Results  Component Value Date   PLT 239 04/15/2016    Inpatient Medications  Scheduled Meds: . apixaban  5 mg Oral BID  . carvedilol  3.125 mg Oral BID WC  . ciprofloxacin  400 mg Intravenous Q12H  . dorzolamide  1 drop Both Eyes BID  .  furosemide  40 mg Oral Daily  . timolol  1 drop Both Eyes Daily   Continuous Infusions:  PRN Meds:.acetaminophen, ondansetron, senna-docusate    Anti-infectives    Start     Dose/Rate Route Frequency Ordered Stop   04/14/16 0000  ciprofloxacin (CIPRO) IVPB 400 mg  Status:  Discontinued     400 mg 200 mL/hr over 60 Minutes Intravenous Every 24 hours 04/12/16 2143 04/13/16 0944   04/13/16 1200  ciprofloxacin (CIPRO) IVPB 400 mg     400 mg 200 mL/hr over 60 Minutes Intravenous Every 12 hours 04/13/16 0944     04/12/16 2000  ciprofloxacin (CIPRO) IVPB 400 mg  Status:  Discontinued     400 mg 200 mL/hr over 60 Minutes Intravenous Every 12 hours 04/12/16 1942 04/12/16 2143        Objective:   Vitals:   04/15/16 0048 04/15/16 0416 04/15/16 0800 04/15/16 1015  BP: 94/62 117/70 103/86 (!) 101/59  Pulse: 66 69 67 77  Resp: 16 20 16 16   Temp: 98.3 F (36.8 C) 98.3 F (36.8 C) 97.8 F (36.6 C) 98.3 F (36.8 C)  TempSrc:  Oral Oral Oral Oral  SpO2: 99% 99%  100%  Weight:  74.5 kg (164 lb 4.8 oz)    Height:        Wt Readings from Last 3 Encounters:  04/15/16 74.5 kg (164 lb 4.8 oz)  03/29/16 72.8 kg (160 lb 8 oz)  03/24/16 72.1 kg (159 lb)     Intake/Output Summary (Last 24 hours) at 04/15/16 1508 Last data filed at 04/14/16 2030  Gross per 24 hour  Intake              240 ml  Output                0 ml  Net              240 ml     Physical Exam  Gen:- Awake Alert,  In no apparent distress  HEENT:- [place].AT, No sclera icterus Neck-Supple Neck,No JVD,.  Lungs-  CTAB  CV- S1, S2 normal Abd-  +ve B.Sounds, Abd Soft, No tenderness,   No CVA tenderness  Extremity/Skin:- No  edema,       Data Review:   Micro Results Recent Results (from the past 240 hour(s))  Urine culture     Status: Abnormal   Collection Time: 04/12/16  5:30 PM  Result Value Ref Range Status   Specimen Description URINE, RANDOM  Final   Special Requests NONE  Final   Culture MULTIPLE SPECIES  PRESENT, SUGGEST RECOLLECTION (A)  Final   Report Status 04/13/2016 FINAL  Final    Radiology Reports Ct Angio Head W/cm &/or Wo Cm  Result Date: 04/13/2016 CLINICAL DATA:  Bilateral blurry vision yesterday. History of diabetes, hypertension, atrial fibrillation and stroke. EXAM: CT ANGIOGRAPHY HEAD AND NECK TECHNIQUE: Multidetector CT imaging of the head and neck was performed using the standard protocol during bolus administration of intravenous contrast. Multiplanar CT image reconstructions and MIPs were obtained to evaluate the vascular anatomy. Carotid stenosis measurements (when applicable) are obtained utilizing NASCET criteria, using the distal internal carotid diameter as the denominator. CONTRAST:  50 cc Isovue 370 COMPARISON:  CT HEAD April 12, 2016 FINDINGS: CT HEAD INTRACRANIAL CONTENTS: The ventricles and sulci are normal for age. No intraparenchymal hemorrhage, mass effect nor midline shift. Patchy supratentorial white matter hypodensities are less than expected for patient's age and though non-specific likely represent chronic small vessel ischemic disease. No acute large vascular territory infarcts. No abnormal extra-axial fluid collections. Basal cisterns are patent. Moderate calcific atherosclerosis of the carotid siphons. ORBITS: The included ocular globes and orbital contents are non-suspicious. Status post bilateral ocular lens implants. SINUSES: The mastoid aircells and included paranasal sinuses are well-aerated. SKULL/SOFT TISSUES: No skull fracture. No significant soft tissue swelling. Scout view demonstrates RIGHT shoulder arthroplasty. 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 CTA NECK AORTIC ARCH: Normal appearance of the thoracic arch, normal branch pattern. Mild calcific atherosclerosis of the aortic arch. The origins of the innominate, left Common carotid artery and subclavian artery are widely patent. RIGHT CAROTID SYSTEM: Common carotid artery is  widely patent, coursing in a straight line fashion. Normal appearance of the carotid bifurcation without hemodynamically significant stenosis by NASCET criteria. Trace calcific atherosclerosis. Normal appearance of the included internal carotid artery. LEFT CAROTID SYSTEM: Common carotid artery is widely patent, coursing in a straight line fashion. Normal appearance of the carotid bifurcation without hemodynamically significant stenosis by NASCET criteria. Trace calcific atherosclerosis. Normal appearance of the included internal carotid artery. VERTEBRAL ARTERIES:Left vertebral artery is dominant. Normal  appearance of the vertebral arteries, which appear widely patent. SKELETON: No acute osseous process though bone windows have not been submitted. Multiple absent teeth. Moderate to severe C4-5 through C6-7 disc height loss, uncovertebral hypertrophy compatible with degenerative disc. Moderate to severe upper LEFT cervical facet arthropathy. No destructive bony lesions. OTHER NECK: Soft tissues of the neck are non-acute though, not tailored for evaluation. RIGHT retropectoral lymphadenopathy measure up to 12 mm short axis. Trace suspected LEFT pleural effusion. Cardiac pacemaker via LEFT subclavian venous approach. Sub cm RIGHT thyroid nodule below size followup recommendations. Patulous esophagus with air-fluid level partially imaged. CTA HEAD ANTERIOR CIRCULATION: Normal appearance of the cervical internal carotid arteries, petrous, cavernous and supra clinoid internal carotid arteries. Widely patent anterior communicating artery. Normal appearance of the anterior and middle cerebral arteries. No large vessel occlusion, hemodynamically significant stenosis, dissection, luminal irregularity, contrast extravasation or aneurysm. POSTERIOR CIRCULATION: Normal appearance of the vertebral arteries, vertebrobasilar junction and basilar artery, as well as main branch vessels. Normal appearance of the posterior cerebral  arteries. No large vessel occlusion, hemodynamically significant stenosis, dissection, luminal irregularity, contrast extravasation or aneurysm. VENOUS SINUSES: Major dural venous sinuses are patent though not tailored for evaluation on this angiographic examination. ANATOMIC VARIANTS: None. DELAYED PHASE: No abnormal intracranial enhancement. IMPRESSION: CTA NECK: No hemodynamically significant stenosis or acute vascular process. RIGHT retropectoral lymphadenopathy partially imaged. Recommend correlation with mammography on a nonemergent basis in dedicated follow-up. Patulous esophagus with air-fluid level presents aspiration risk. CTA HEAD: Negative. Electronically Signed   By: Elon Alas M.D.   On: 04/13/2016 19:02   Dg Chest 2 View  Result Date: 03/24/2016 CLINICAL DATA:  Shortness of breath and dizziness beginning this morning. EXAM: CHEST  2 VIEW COMPARISON:  PA and lateral chest 10/08/2014. FINDINGS: The lungs are clear. There is cardiomegaly. Pacing device is in place. No pneumothorax or pleural effusion. Aortic atherosclerosis is seen. Thoracic spondylosis noted. IMPRESSION: Cardiomegaly without acute disease. Atherosclerosis. Electronically Signed   By: Inge Rise M.D.   On: 03/24/2016 13:13  Ct Angio Neck W/cm &/or Wo/cm  Result Date: 04/13/2016 CLINICAL DATA:  Bilateral blurry vision yesterday. History of diabetes, hypertension, atrial fibrillation and stroke. EXAM: CT ANGIOGRAPHY HEAD AND NECK TECHNIQUE: Multidetector CT imaging of the head and neck was performed using the standard protocol during bolus administration of intravenous contrast. Multiplanar CT image reconstructions and MIPs were obtained to evaluate the vascular anatomy. Carotid stenosis measurements (when applicable) are obtained utilizing NASCET criteria, using the distal internal carotid diameter as the denominator. CONTRAST:  50 cc Isovue 370 COMPARISON:  CT HEAD April 12, 2016 FINDINGS: CT HEAD INTRACRANIAL  CONTENTS: The ventricles and sulci are normal for age. No intraparenchymal hemorrhage, mass effect nor midline shift. Patchy supratentorial white matter hypodensities are less than expected for patient's age and though non-specific likely represent chronic small vessel ischemic disease. No acute large vascular territory infarcts. No abnormal extra-axial fluid collections. Basal cisterns are patent. Moderate calcific atherosclerosis of the carotid siphons. ORBITS: The included ocular globes and orbital contents are non-suspicious. Status post bilateral ocular lens implants. SINUSES: The mastoid aircells and included paranasal sinuses are well-aerated. SKULL/SOFT TISSUES: No skull fracture. No significant soft tissue swelling. Scout view demonstrates RIGHT shoulder arthroplasty. 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 CTA NECK AORTIC ARCH: Normal appearance of the thoracic arch, normal branch pattern. Mild calcific atherosclerosis of the aortic arch. The origins of the innominate, left Common carotid artery and subclavian artery are widely patent. RIGHT CAROTID SYSTEM: Common  carotid artery is widely patent, coursing in a straight line fashion. Normal appearance of the carotid bifurcation without hemodynamically significant stenosis by NASCET criteria. Trace calcific atherosclerosis. Normal appearance of the included internal carotid artery. LEFT CAROTID SYSTEM: Common carotid artery is widely patent, coursing in a straight line fashion. Normal appearance of the carotid bifurcation without hemodynamically significant stenosis by NASCET criteria. Trace calcific atherosclerosis. Normal appearance of the included internal carotid artery. VERTEBRAL ARTERIES:Left vertebral artery is dominant. Normal appearance of the vertebral arteries, which appear widely patent. SKELETON: No acute osseous process though bone windows have not been submitted. Multiple absent teeth. Moderate to severe C4-5 through  C6-7 disc height loss, uncovertebral hypertrophy compatible with degenerative disc. Moderate to severe upper LEFT cervical facet arthropathy. No destructive bony lesions. OTHER NECK: Soft tissues of the neck are non-acute though, not tailored for evaluation. RIGHT retropectoral lymphadenopathy measure up to 12 mm short axis. Trace suspected LEFT pleural effusion. Cardiac pacemaker via LEFT subclavian venous approach. Sub cm RIGHT thyroid nodule below size followup recommendations. Patulous esophagus with air-fluid level partially imaged. CTA HEAD ANTERIOR CIRCULATION: Normal appearance of the cervical internal carotid arteries, petrous, cavernous and supra clinoid internal carotid arteries. Widely patent anterior communicating artery. Normal appearance of the anterior and middle cerebral arteries. No large vessel occlusion, hemodynamically significant stenosis, dissection, luminal irregularity, contrast extravasation or aneurysm. POSTERIOR CIRCULATION: Normal appearance of the vertebral arteries, vertebrobasilar junction and basilar artery, as well as main branch vessels. Normal appearance of the posterior cerebral arteries. No large vessel occlusion, hemodynamically significant stenosis, dissection, luminal irregularity, contrast extravasation or aneurysm. VENOUS SINUSES: Major dural venous sinuses are patent though not tailored for evaluation on this angiographic examination. ANATOMIC VARIANTS: None. DELAYED PHASE: No abnormal intracranial enhancement. IMPRESSION: CTA NECK: No hemodynamically significant stenosis or acute vascular process. RIGHT retropectoral lymphadenopathy partially imaged. Recommend correlation with mammography on a nonemergent basis in dedicated follow-up. Patulous esophagus with air-fluid level presents aspiration risk. CTA HEAD: Negative. Electronically Signed   By: Elon Alas M.D.   On: 04/13/2016 19:02   Ct Head Code Stroke Wo Contrast  Addendum Date: 04/12/2016   ADDENDUM  REPORT: 04/12/2016 15:50 ADDENDUM: Study discussed by telephone with Dr. Roland Rack on 04/12/2016 at 1548 hours. Electronically Signed   By: Genevie Ann M.D.   On: 04/12/2016 15:50   Result Date: 04/12/2016 CLINICAL DATA:  Code stroke. 82 year old female with vision loss. Initial encounter. EXAM: CT HEAD WITHOUT CONTRAST TECHNIQUE: Contiguous axial images were obtained from the base of the skull through the vertex without intravenous contrast. COMPARISON:  Head CT without contrast 11/23/2015, and earlier. FINDINGS: Visualized paranasal sinuses and mastoids are stable and well pneumatized. No acute osseous abnormality identified. Stable and negative visualized orbits soft tissues. Visualized scalp soft tissues are within normal limits. Calcified atherosclerosis at the skull base. Cerebral volume remains normal for age. No midline shift, ventriculomegaly, mass effect, evidence of mass lesion, intracranial hemorrhage or evidence of cortically based acute infarction. Gray-white matter differentiation is within normal limits throughout the brain. No suspicious intracranial vascular hyperdensity. ASPECTS [facility] Stroke Program Early CT Score) Total score (0-10 with 10 being normal): 10 IMPRESSION: 1. Stable and normal for age Normal noncontrast CT appearance of the brain. 2. ASPECTS is 10. Electronically Signed: By: Genevie Ann M.D. On: 04/12/2016 15:47     CBC  Recent Labs Lab 04/12/16 1524 04/12/16 1538 04/13/16 0438 04/15/16 0551  WBC 4.2  --  4.3 4.6  HGB 11.2* 12.2 9.7* 10.3*  HCT  35.3* 36.0 30.3* 33.2*  PLT 268  --  235 239  MCV 88.3  --  87.8 90.0  MCH 28.0  --  28.1 27.9  MCHC 31.7  --  32.0 31.0  RDW 13.8  --  13.9 14.1  LYMPHSABS 1.6  --   --  1.0  MONOABS 0.3  --   --  0.4  EOSABS 0.1  --   --  0.2  BASOSABS 0.0  --   --  0.0    Chemistries   Recent Labs Lab 04/12/16 1524 04/12/16 1538 04/13/16 0438 04/15/16 0551  NA 135 138 135 139  K 4.2 4.2 3.8 4.2  CL 107 105 107 111    CO2 21*  --  20* 21*  GLUCOSE 100* 96 106* 91  BUN 17 18 15 9   CREATININE 1.43* 1.40* 1.17* 0.99  CALCIUM 9.0  --  8.3* 8.8*  AST 21  --   --   --   ALT 10*  --   --   --   ALKPHOS 54  --   --   --   BILITOT 1.1  --   --   --    ------------------------------------------------------------------------------------------------------------------  Recent Labs  04/13/16 0438  CHOL 90  HDL 30*  LDLCALC 46  TRIG 69  CHOLHDL 3.0    Lab Results  Component Value Date   HGBA1C 5.7 (H) 04/13/2016   ------------------------------------------------------------------------------------------------------------------ No results for input(s): TSH, T4TOTAL, T3FREE, THYROIDAB in the last 72 hours.  Invalid input(s): FREET3 ------------------------------------------------------------------------------------------------------------------ No results for input(s): VITAMINB12, FOLATE, FERRITIN, TIBC, IRON, RETICCTPCT in the last 72 hours.  Coagulation profile  Recent Labs Lab 04/12/16 1524  INR 1.28    No results for input(s): DDIMER in the last 72 hours.  Cardiac Enzymes  Recent Labs Lab 04/12/16 2115 04/13/16 0438 04/13/16 1351  TROPONINI 0.11* 0.11* 0.09*   ------------------------------------------------------------------------------------------------------------------    Component Value Date/Time   BNP 304.3 (H) 03/26/2016 0901     Bernise Sylvain M.D on 04/15/2016 at 3:08 PM  Between 7am to 7pm - Pager - [phone]  After 7pm go to [email or web address] - password [email or web address]  [facility] -  Office  [phone]  Dragon dictation system was used to create this note, attempts have been made to correct errors, however presence of uncorrected errors is not a reflection quality of care provided

## 2016-04-15 NOTE — Care Management Note (Signed)
Case Management Note  Patient Details  Name: Kim Mullins MRN: [id number] Date of Birth: 1930-02-29  Subjective/Objective:                    Action/Plan: PT/OT recommending [place] services. CM following for discharge needs.   Expected Discharge Date:                  Expected Discharge Plan:     In-House Referral:     Discharge planning Services     Post Acute Care Choice:    Choice offered to:     DME Arranged:    DME Agency:     HH Arranged:    HH Agency:     Status of Service:  In process, will continue to follow  If discussed at Long Length of Stay Meetings, dates discussed:    Additional Comments:  Pollie Friar, RN 04/15/2016, 3:30 PM

## 2016-04-15 NOTE — Telephone Encounter (Signed)
Patient is currently in the hospital in [place] for stroke. Pt had appt schedule on 04/13/2016 but had to cancel. This is per appt cancellation.

## 2016-04-15 NOTE — Progress Notes (Signed)
Pt ambulated in the hall way with this nurse stand by assist with rolling walker. Gait steady. No noted distress. Pt sitting up on the side of the bed watching television at this time. Denies pain or discomfort. Call bell within reach. Will continue to monitor.

## 2016-04-15 NOTE — Progress Notes (Signed)
Advanced Heart Failure Rounding Note  Referring Physician: Dr Grandville Silos PCP: Dr. Ayesha Rumpf Cardiology: Dr. Radford Pax HF Cardiology: Dr. Aundra Dubin  Reason for Consultation: Systolic HF/ TIAs / BP   Subjective:    Admitted 04/12/16 with TIA. Spontaneously resolved. Now off BP meds with hypotension. Diuretics held as well.   Feels good today.  Occasional lightheadedness. No further TIA symptoms. No bleeding on increased eliquis. BP more stable in 100s.   Eliquis increased to 5 mg BID 04/13/16.  Objective:   Weight Range: 164 lb 4.8 oz (74.5 kg) Body mass index is 27.34 kg/m.   Vital Signs:   Temp:  [97.8 F (36.6 C)-99.4 F (37.4 C)] 98.3 F (36.8 C) (08/17 1015) Pulse Rate:  [66-77] 77 (08/17 1015) Resp:  [16-20] 16 (08/17 1015) BP: (94-117)/(59-86) 101/59 (08/17 1015) SpO2:  [97 %-100 %] 100 % (08/17 1015) Weight:  [164 lb 4.8 oz (74.5 kg)] 164 lb 4.8 oz (74.5 kg) (08/17 0416) Last BM Date: 04/14/16  Weight change: Filed Weights   04/12/16 2121 04/14/16 0547 04/15/16 0416  Weight: 164 lb 7.4 oz (74.6 kg) 163 lb 12.8 oz (74.3 kg) 164 lb 4.8 oz (74.5 kg)    Intake/Output:   Intake/Output Summary (Last 24 hours) at 04/15/16 1209 Last data filed at 04/14/16 2030  Gross per 24 hour  Intake              240 ml  Output                0 ml  Net              240 ml     Physical Exam: General: NAD, Elderly appearing Neck: JVP 8-9 cm, no thyromegaly or thyroid nodule.  Lungs: Clear, NAD  CV: Nondisplaced PMI. Heart regular S1/S2, no S3/S4, no murmur. No peripheral edema. No carotid bruit. Normal pedal pulses.  Abdomen: Soft, NT, ND, no HSM. No bruits or masses. +BS  Skin: Intact without lesions or rashes.  Neurologic: Alert and oriented x 3.  Psych: Normal affect. Extremities: No clubbing or cyanosis.  HEENT: Normal.   Telemetry: Reviewed, NSR  Labs: CBC  Recent Labs  04/12/16 1524  04/13/16 0438 04/15/16 0551  WBC 4.2  --  4.3 4.6  NEUTROABS 2.2  --   --   3.0  HGB 11.2*  < > 9.7* 10.3*  HCT 35.3*  < > 30.3* 33.2*  MCV 88.3  --  87.8 90.0  PLT 268  --  235 239  < > = values in this interval not displayed. Basic Metabolic Panel  Recent Labs  04/13/16 0438 04/15/16 0551  NA 135 139  K 3.8 4.2  CL 107 111  CO2 20* 21*  GLUCOSE 106* 91  BUN 15 9  CREATININE 1.17* 0.99  CALCIUM 8.3* 8.8*   Liver Function Tests  Recent Labs  04/12/16 1524  AST 21  ALT 10*  ALKPHOS 54  BILITOT 1.1  PROT 7.2  ALBUMIN 3.4*   No results for input(s): LIPASE, AMYLASE in the last 72 hours. Cardiac Enzymes  Recent Labs  04/12/16 2115 04/13/16 0438 04/13/16 1351  TROPONINI 0.11* 0.11* 0.09*    BNP: BNP (last 3 results)  Recent Labs  03/26/16 0901  BNP 304.3*    ProBNP (last 3 results) No results for input(s): PROBNP in the last 8760 hours.   D-Dimer No results for input(s): DDIMER in the last 72 hours. Hemoglobin A1C  Recent Labs  04/13/16 0438  HGBA1C 5.7*   Fasting Lipid Panel  Recent Labs  04/13/16 0438  CHOL 90  HDL 30*  LDLCALC 46  TRIG 69  CHOLHDL 3.0   Thyroid Function Tests No results for input(s): TSH, T4TOTAL, T3FREE, THYROIDAB in the last 72 hours.  Invalid input(s): FREET3  Other results:     Imaging/Studies:  Ct Angio Head W/cm &/or Wo Cm  Result Date: 04/13/2016 CLINICAL DATA:  Bilateral blurry vision yesterday. History of diabetes, hypertension, atrial fibrillation and stroke. EXAM: CT ANGIOGRAPHY HEAD AND NECK TECHNIQUE: Multidetector CT imaging of the head and neck was performed using the standard protocol during bolus administration of intravenous contrast. Multiplanar CT image reconstructions and MIPs were obtained to evaluate the vascular anatomy. Carotid stenosis measurements (when applicable) are obtained utilizing NASCET criteria, using the distal internal carotid diameter as the denominator. CONTRAST:  50 cc Isovue 370 COMPARISON:  CT HEAD April 12, 2016 FINDINGS: CT HEAD INTRACRANIAL  CONTENTS: The ventricles and sulci are normal for age. No intraparenchymal hemorrhage, mass effect nor midline shift. Patchy supratentorial white matter hypodensities are less than expected for patient's age and though non-specific likely represent chronic small vessel ischemic disease. No acute large vascular territory infarcts. No abnormal extra-axial fluid collections. Basal cisterns are patent. Moderate calcific atherosclerosis of the carotid siphons. ORBITS: The included ocular globes and orbital contents are non-suspicious. Status post bilateral ocular lens implants. SINUSES: The mastoid aircells and included paranasal sinuses are well-aerated. SKULL/SOFT TISSUES: No skull fracture. No significant soft tissue swelling. Scout view demonstrates RIGHT shoulder arthroplasty. 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 CTA NECK AORTIC ARCH: Normal appearance of the thoracic arch, normal branch pattern. Mild calcific atherosclerosis of the aortic arch. The origins of the innominate, left Common carotid artery and subclavian artery are widely patent. RIGHT CAROTID SYSTEM: Common carotid artery is widely patent, coursing in a straight line fashion. Normal appearance of the carotid bifurcation without hemodynamically significant stenosis by NASCET criteria. Trace calcific atherosclerosis. Normal appearance of the included internal carotid artery. LEFT CAROTID SYSTEM: Common carotid artery is widely patent, coursing in a straight line fashion. Normal appearance of the carotid bifurcation without hemodynamically significant stenosis by NASCET criteria. Trace calcific atherosclerosis. Normal appearance of the included internal carotid artery. VERTEBRAL ARTERIES:Left vertebral artery is dominant. Normal appearance of the vertebral arteries, which appear widely patent. SKELETON: No acute osseous process though bone windows have not been submitted. Multiple absent teeth. Moderate to severe C4-5 through  C6-7 disc height loss, uncovertebral hypertrophy compatible with degenerative disc. Moderate to severe upper LEFT cervical facet arthropathy. No destructive bony lesions. OTHER NECK: Soft tissues of the neck are non-acute though, not tailored for evaluation. RIGHT retropectoral lymphadenopathy measure up to 12 mm short axis. Trace suspected LEFT pleural effusion. Cardiac pacemaker via LEFT subclavian venous approach. Sub cm RIGHT thyroid nodule below size followup recommendations. Patulous esophagus with air-fluid level partially imaged. CTA HEAD ANTERIOR CIRCULATION: Normal appearance of the cervical internal carotid arteries, petrous, cavernous and supra clinoid internal carotid arteries. Widely patent anterior communicating artery. Normal appearance of the anterior and middle cerebral arteries. No large vessel occlusion, hemodynamically significant stenosis, dissection, luminal irregularity, contrast extravasation or aneurysm. POSTERIOR CIRCULATION: Normal appearance of the vertebral arteries, vertebrobasilar junction and basilar artery, as well as main branch vessels. Normal appearance of the posterior cerebral arteries. No large vessel occlusion, hemodynamically significant stenosis, dissection, luminal irregularity, contrast extravasation or aneurysm. VENOUS SINUSES: Major dural venous sinuses are patent though not tailored for evaluation on this angiographic  examination. ANATOMIC VARIANTS: None. DELAYED PHASE: No abnormal intracranial enhancement. IMPRESSION: CTA NECK: No hemodynamically significant stenosis or acute vascular process. RIGHT retropectoral lymphadenopathy partially imaged. Recommend correlation with mammography on a nonemergent basis in dedicated follow-up. Patulous esophagus with air-fluid level presents aspiration risk. CTA HEAD: Negative. Electronically Signed   By: Elon Alas M.D.   On: 04/13/2016 19:02   Ct Angio Neck W/cm &/or Wo/cm  Result Date: 04/13/2016 CLINICAL DATA:   Bilateral blurry vision yesterday. History of diabetes, hypertension, atrial fibrillation and stroke. EXAM: CT ANGIOGRAPHY HEAD AND NECK TECHNIQUE: Multidetector CT imaging of the head and neck was performed using the standard protocol during bolus administration of intravenous contrast. Multiplanar CT image reconstructions and MIPs were obtained to evaluate the vascular anatomy. Carotid stenosis measurements (when applicable) are obtained utilizing NASCET criteria, using the distal internal carotid diameter as the denominator. CONTRAST:  50 cc Isovue 370 COMPARISON:  CT HEAD April 12, 2016 FINDINGS: CT HEAD INTRACRANIAL CONTENTS: The ventricles and sulci are normal for age. No intraparenchymal hemorrhage, mass effect nor midline shift. Patchy supratentorial white matter hypodensities are less than expected for patient's age and though non-specific likely represent chronic small vessel ischemic disease. No acute large vascular territory infarcts. No abnormal extra-axial fluid collections. Basal cisterns are patent. Moderate calcific atherosclerosis of the carotid siphons. ORBITS: The included ocular globes and orbital contents are non-suspicious. Status post bilateral ocular lens implants. SINUSES: The mastoid aircells and included paranasal sinuses are well-aerated. SKULL/SOFT TISSUES: No skull fracture. No significant soft tissue swelling. Scout view demonstrates RIGHT shoulder arthroplasty. 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 CTA NECK AORTIC ARCH: Normal appearance of the thoracic arch, normal branch pattern. Mild calcific atherosclerosis of the aortic arch. The origins of the innominate, left Common carotid artery and subclavian artery are widely patent. RIGHT CAROTID SYSTEM: Common carotid artery is widely patent, coursing in a straight line fashion. Normal appearance of the carotid bifurcation without hemodynamically significant stenosis by NASCET criteria. Trace calcific  atherosclerosis. Normal appearance of the included internal carotid artery. LEFT CAROTID SYSTEM: Common carotid artery is widely patent, coursing in a straight line fashion. Normal appearance of the carotid bifurcation without hemodynamically significant stenosis by NASCET criteria. Trace calcific atherosclerosis. Normal appearance of the included internal carotid artery. VERTEBRAL ARTERIES:Left vertebral artery is dominant. Normal appearance of the vertebral arteries, which appear widely patent. SKELETON: No acute osseous process though bone windows have not been submitted. Multiple absent teeth. Moderate to severe C4-5 through C6-7 disc height loss, uncovertebral hypertrophy compatible with degenerative disc. Moderate to severe upper LEFT cervical facet arthropathy. No destructive bony lesions. OTHER NECK: Soft tissues of the neck are non-acute though, not tailored for evaluation. RIGHT retropectoral lymphadenopathy measure up to 12 mm short axis. Trace suspected LEFT pleural effusion. Cardiac pacemaker via LEFT subclavian venous approach. Sub cm RIGHT thyroid nodule below size followup recommendations. Patulous esophagus with air-fluid level partially imaged. CTA HEAD ANTERIOR CIRCULATION: Normal appearance of the cervical internal carotid arteries, petrous, cavernous and supra clinoid internal carotid arteries. Widely patent anterior communicating artery. Normal appearance of the anterior and middle cerebral arteries. No large vessel occlusion, hemodynamically significant stenosis, dissection, luminal irregularity, contrast extravasation or aneurysm. POSTERIOR CIRCULATION: Normal appearance of the vertebral arteries, vertebrobasilar junction and basilar artery, as well as main branch vessels. Normal appearance of the posterior cerebral arteries. No large vessel occlusion, hemodynamically significant stenosis, dissection, luminal irregularity, contrast extravasation or aneurysm. VENOUS SINUSES: Major dural venous  sinuses are patent though not tailored  for evaluation on this angiographic examination. ANATOMIC VARIANTS: None. DELAYED PHASE: No abnormal intracranial enhancement. IMPRESSION: CTA NECK: No hemodynamically significant stenosis or acute vascular process. RIGHT retropectoral lymphadenopathy partially imaged. Recommend correlation with mammography on a nonemergent basis in dedicated follow-up. Patulous esophagus with air-fluid level presents aspiration risk. CTA HEAD: Negative. Electronically Signed   By: Elon Alas M.D.   On: 04/13/2016 19:02    Latest Echo  Latest Cath   Medications:     Scheduled Medications: . apixaban  5 mg Oral BID  . ciprofloxacin  400 mg Intravenous Q12H  . dorzolamide  1 drop Both Eyes BID  . timolol  1 drop Both Eyes Daily    Infusions:    PRN Medications: acetaminophen, ondansetron, senna-docusate   Assessment/Plan  82 yo with history of chronic systolic CHF/nonischemic cardiomyopathy, prior complete heart block, OSA, and paroxysmal atrial fibrillation who presented to [facility] 04/12/16 with TIA.   1. TIA versus CVA: Visual symptoms resolved. ?hypoperfusion vs underdosing of Eliquis.  - CTA head/neck with unremarkable except for incidental retropectoral lymphadenopathy.  - Continue Eliquis 5 mg bid  2. Chronic systolic CHF: Nonischemic cardiomyopathy.  EF up to 40-45% on echo this admission, from 15-20% on prior echo. She is now off Coreg, lisinopril, and spironolactone with soft BP.  Currently, SBP in 90s.  Lasix has been held. On exam, JVP mildly elevated.  - Hold lisinopril, spironolactone for now. Would eventually like to get back on at least some of these.  - Will try back on low dose coreg this evening. 3.125 mg BID.  - Weight up slightly. Will resume po lasix at decreased dose of 40 mg daily.   - Has [employer] CRT-P device.  3. Atrial fibrillation: Paroxysmal.  NSR.  - Continue eliquis 5 mg bid.   Length of Stay: 1  Shirley Friar  PA-C 04/15/2016, 12:09 PM  Advanced Heart Failure Team Pager [phone] (M-F; 7a - 4p)  Please contact [facility] for night-coverage after hours (4p -7a ) and weekends on [email or web address]  Patient seen with PA, agree with the above note.  BP running a bit higher today, she has done some walking. No residual neurological symptoms.  - Continue higher dose of Eliquis.  - Start low dose Coreg.  - Start back on Lasix 40 mg daily, mild volume overload on exam.   [name] 04/15/2016 12:41 PM

## 2016-04-15 NOTE — Progress Notes (Signed)
Pt insists on sitting up on the side of the bed to eat her meals.

## 2016-04-15 NOTE — Progress Notes (Signed)
Physical Therapy Treatment Patient Details Name: Kim Mullins MRN: [id number] DOB: [birth year] Today's Date: 04/15/2016    History of Present Illness pt is an 82 y/o female with pmh of CHF/NICM, complete heart block post pacer, Paroxysmal afib, prior CVA/TOA presented to the ED with sudden onset of blurry vision.  Symptoms improved in ED and TpA not given.  CT showed no acute abnormalities, unable to have MRI.    PT Comments    Progressing steadily.  More steady with gait, but agree with pt that in the short-term she would benefit from use of the RW.  Also agree with resuming [employer] activity and starting CRP2 when able.   Follow Up Recommendations  No PT follow up;Other (comment) (Recommend getting CRP2 outpatient consult)     Equipment Recommendations  Rolling walker with 5" wheels    Recommendations for Other Services       Precautions / Restrictions Precautions Precautions: Fall    Mobility  Bed Mobility Overal bed mobility: Needs Assistance Bed Mobility: Supine to Sit     Supine to sit: Supervision        Transfers Overall transfer level: Needs assistance   Transfers: Sit to/from Stand Sit to Stand: Supervision         General transfer comment: reinforced safe hand placement when using RW  Ambulation/Gait Ambulation/Gait assistance: Supervision Ambulation Distance (Feet): 100 Feet Assistive device: Rolling walker (2 wheeled) Gait Pattern/deviations: Step-through pattern Gait velocity: slower Gait velocity interpretation: Below normal speed for age/gender General Gait Details: generally steady today, but benefits from the RW   Stairs            Wheelchair Mobility    Modified Rankin (Stroke Patients Only)       Balance Overall balance assessment: Needs assistance Sitting-balance support: No upper extremity supported Sitting balance-Leahy Scale: Good Sitting balance - Comments: reaches far outside BOS and accept challenge      Standing balance-Leahy Scale: Fair Standing balance comment: sanitizing hands without need for RW                    Cognition Arousal/Alertness: Awake/alert Behavior During Therapy: WFL for tasks assessed/performed Overall Cognitive Status: Within Functional Limits for tasks assessed                      Exercises      General Comments        Pertinent Vitals/Pain Pain Assessment: No/denies pain    Home Living                      Prior Function            PT Goals (current goals can now be found in the care plan section) Acute Rehab PT Goals Patient Stated Goal: back home PT Goal Formulation: With patient Time For Goal Achievement: 04/20/16 Potential to Achieve Goals: Good Progress towards PT goals: Progressing toward goals    Frequency  Min 3X/week    PT Plan Current plan remains appropriate    Co-evaluation             End of Session   Activity Tolerance: Patient tolerated treatment well Patient left: in bed;Other (comment) (sitting EOB)     Time: [id number] PT Time Calculation (min) (ACUTE ONLY): 23 min  Charges:  $Gait Training: 8-22 mins $Self Care/Home Management: 8-22  G Codes:      Fanchon Papania, Tessie Fass 04/15/2016, 4:46 PM 04/15/2016  Donnella Sham, [place] [phone]  (pager)

## 2016-04-16 LAB — BASIC METABOLIC PANEL
Anion gap: 9 (ref 5–15)
BUN: 8 mg/dL (ref 6–20)
CALCIUM: 8.8 mg/dL — AB (ref 8.9–10.3)
CO2: 23 mmol/L (ref 22–32)
CREATININE: 0.96 mg/dL (ref 0.44–1.00)
Chloride: 108 mmol/L (ref 101–111)
GFR calc non Af Amer: 53 mL/min — ABNORMAL LOW (ref >60)
GLUCOSE: 99 mg/dL (ref 65–99)
Potassium: 4 mmol/L (ref 3.5–5.1)
Sodium: 140 mmol/L (ref 135–145)

## 2016-04-16 MED ORDER — CIPROFLOXACIN HCL 500 MG PO TABS: 500.0000 mg | ORAL_TABLET | Freq: Two times a day (BID) | ORAL | Status: DC

## 2016-04-16 MED ORDER — DICLOFENAC SODIUM 1 % TD GEL: 1.0000 "application " | Freq: Three times a day (TID) | TRANSDERMAL | 0 refills | Status: DC | PRN

## 2016-04-16 MED ORDER — DICLOFENAC SODIUM 1 % TD GEL: 2.0000 g | Freq: Three times a day (TID) | TRANSDERMAL | 0 refills | Status: DC | PRN

## 2016-04-16 MED ORDER — SPIRONOLACTONE 25 MG PO TABS
12.5000 mg | ORAL_TABLET | Freq: Every day | ORAL | Status: DC
  Administered 2016-04-16: 12.5 mg via ORAL
  Filled 2016-04-16: qty 1

## 2016-04-16 MED ORDER — APIXABAN 5 MG PO TABS: 5.0000 mg | ORAL_TABLET | Freq: Two times a day (BID) | ORAL | 6 refills | Status: DC

## 2016-04-16 MED ORDER — CARVEDILOL 3.125 MG PO TABS: 3.1250 mg | ORAL_TABLET | Freq: Two times a day (BID) | ORAL | 6 refills | Status: DC

## 2016-04-16 NOTE — Progress Notes (Signed)
nse called and pt for dc and needs rolling walker. Have text and left voice mail for jermaine w [facility] for rw to be del to room. Alerted him pt for dc today.

## 2016-04-16 NOTE — Progress Notes (Signed)
Patient is being d/c home. D/c instructions given and patient verbalized understanding. 

## 2016-04-16 NOTE — Discharge Summary (Addendum)
SHAWNIECE Mullins, is a 82 y.o. female  DOB [birth year]  MRN [id number].  Admission date:  04/12/2016  Admitting Physician  Eugenie Filler, MD  Discharge Date:  04/16/2016   Primary MD  Kristine Garbe, MD  Recommendations for primary care physician for things to follow:    Admission Diagnosis  Vision loss [H54.7]   Discharge Diagnosis  Vision loss [H54.7]  /TIA  Principal Problem:   TIA (transient ischemic attack) Active Problems:   Osteoarthritis of right shoulder region   Dyspnea   Pulmonary HTN (HCC)   DCM (dilated cardiomyopathy) (HCC)   Benign essential HTN   Chronic systolic CHF (congestive heart failure) (HCC)   Complete heart block (HCC)   Pacemaker   CVA (cerebral infarction)   Atrial fibrillation ([place])   UTI (lower urinary tract infection)   AKI (acute kidney injury) ([place])   Hypotension due to drugs   Chronic anticoagulation   Hypotension      Past Medical History:  Diagnosis Date  . Anemia    occassionally  . Arthritis    all over.  . Asthma    Allergixc reaction to cats.  . Atrial fibrillation ([place])   . Bowel obstruction ([place])   . Chronic systolic CHF (congestive heart failure) (HCC)    EF 30-35% by cath, echo 2016 EF 50%  . Complete heart block (HCC)    a. s/p MDT CRTP pacemaker  . DCM (dilated cardiomyopathy) ([place]) 08/16/2014   normal coronary arteries on cath with EF 30-45%.  EF now 50% by echo 11/2014  . Diabetes mellitus 2006   Diet and exercise controlled.  Marland Kitchen GERD (gastroesophageal reflux disease)    occ  . Hypertension   . Left tibial fracture 2007  . OSA (obstructive sleep apnea) 10/23/2014   Moderate with AHI 21/hr  . Osteoarthritis of right shoulder region 06/26/2013  . Stroke [facility])     Past Surgical History:  Procedure Laterality Date  . ABDOMINAL HYSTERECTOMY  1969  . BI-VENTRICULAR PACEMAKER INSERTION N/A 10/07/2014   MDT CRTP implanted by Dr  Lovena Le  . BRAVO [place] STUDY N/A 06/11/2014   Procedure: BRAVO [place] STUDY;  Surgeon: Inda Castle, MD;  Location: [facility] ENDOSCOPY;  Service: Endoscopy;  Laterality: N/A;  . BUNIONECTOMY  1984   Bilateral  . CARDIAC CATHETERIZATION     normal coronary arteries  . Carpal [place]  2004   Bilateral  . CATARACT EXTRACTION Right    early 2015  . corn removal  1999   Bilateral feet  . DILATION AND CURETTAGE OF UTERUS  1968  . ESOPHAGOGASTRODUODENOSCOPY (EGD) WITH PROPOFOL N/A 06/11/2014   Procedure: ESOPHAGOGASTRODUODENOSCOPY (EGD) WITH PROPOFOL;  Surgeon: Inda Castle, MD;  Location: [facility];  Service: Endoscopy;  Laterality: N/A;  . [place]   Surgery to fix Retainal detachment, bilateral  . JOINT REPLACEMENT    . LEFT AND RIGHT HEART CATHETERIZATION WITH CORONARY ANGIOGRAM N/A 08/29/2014   Procedure: LEFT AND RIGHT HEART CATHETERIZATION WITH CORONARY ANGIOGRAM;  Surgeon:  Peter M Martinique, MD;  Location: [facility] CATH LAB;  Service: Cardiovascular;  Laterality: N/A;  . MALONEY DILATION  06/11/2014   Procedure: Venia Minks DILATION;  Surgeon: Inda Castle, MD;  Location: [facility];  Service: Endoscopy;;  . PILONIDAL CYST EXCISION  1959  . TONSILLECTOMY  1942  . TOTAL HIP ARTHROPLASTY  11/16/2011   Procedure: TOTAL HIP ARTHROPLASTY ANTERIOR APPROACH;  Surgeon: Hessie Dibble, MD;  Location: [place];  Service: Orthopedics;  Laterality: Right;  DEPUY  . TOTAL SHOULDER ARTHROPLASTY Right 06/26/2013   Procedure: TOTAL SHOULDER ARTHROPLASTY;  Surgeon: Johnny Bridge, MD;  Location: [place];  Service: Orthopedics;  Laterality: Right;       HPI  from the history and physical done on the day of admission:   Chief Complaint: Blurry vision  HPI: Kim Mullins is a 82 y.o. female with medical history significant of chronic systolic CHF/nonischemic cardiomyopathy, complete heart block status post pacemaker placement, paroxysmal atrial fibrillation on chronic anticoagulation, history of  CHF last EF 15-20% right ventricular dysfunction on echo of June 2017, prior history of CVA/TIA presented to the ED with sudden onset of blurry vision. Patient states she was watching television around 2 PM when she noted she had some blurry vision she called her emergency contact guard brought to the emergency room. Patient denied any slurred speech, no expressive aphasia, no facial droop, no asymmetric weakness or numbness, no melena, no hematochezia, no hematemesis, no diarrhea, no constipation, no fever, no chills, no nausea, no vomiting, no chest pain, no shortness of breath, no abdominal pain, no dysuria. Patient does endorse some generalized weakness. Patient also complains of some fuzziness. Patient does state that 2 days prior to admission she was visiting in [place] when she had an expressive aphasia went to the hospital however MRI could not be done secondary to a pacemaker. Patient states she was treated for urinary tract infection and has taken 2 days of antibiotics as well as dehydration and discharged. Patient states she had similar symptoms the last time she was diagnosed with a CVA/TIA in October.   ED Course: Patient was seen in the emergency room, code stroke was called however due to improvement in symptoms TPN was not administered. CT head which was done was negative for any acute abnormalities. Comprehensive metabolic profile done at a creatinine of 1.43 otherwise is within normal limits. Point-of-care troponin was 0.12. CBC obtained at a hemoglobin of 11.2 otherwise was unremarkable. INR was 1.28. Neurology was consulted and saw the patient in consultation.[facility] were called to admit the patient for further evaluation and management.       Hospital Course:     1)1)TIA-CTA head and neck negative for acute CVA, aggressive risk factor modification, c/n  Eliquis   5 mg twice a day   2)Non-ischemic Cardiomyopathy/systolic dysfunction CHF-much improved repeat echo with  EF of 40 to 45 %, up from 15-25% in June 2017,Continue Coreg 3.125 mg twice a day, Aldactone 12.5 mg twice a day cardiology service has discontinue lisinopril for now, Lasix 40 mg every morning and 20 mg every afternoon  3) Paroxysmal Afib- Coreg 3.125 mg by mouth twice a day, Eliquis   as been increased to 5 mg twice a day as above in #1  4)Possible UTI/AKI- Treated empirically with Cipro however urine culture without definite growth .  AKI- resolved   Discharge Condition: improved  Follow UP  Follow-up Information    Xu,Jindong, MD Follow up in 2 month(s).  Specialty:  Neurology Contact information: [address] [place] [address] [phone]        [name], MD Follow up on 04/26/2016.   Specialty:  Cardiology Why:  at 940 am for post hospital follow up. Please bring all of your medications to your visit. The code for parking is 0003. Contact information: [place] [address] [phone]            Consults obtained - card/neuro  Diet and Activity recommendation:  As advised  Discharge Instructions    Discharge Instructions    (HEART FAILURE PATIENTS) Call MD:  Anytime you have any of the following symptoms: 1) 3 pound weight gain in 24 hours or 5 pounds in 1 week 2) shortness of breath, with or without a dry hacking cough 3) swelling in the hands, feet or stomach 4) if you have to sleep on extra pillows at night in order to breathe.    Complete by:  As directed   Ambulatory referral to Neurology    Complete by:  As directed   Pt will follow up with Dr. Erlinda Hong at [facility] in about 2 months. Thanks.   Call MD for:  persistant dizziness or light-headedness    Complete by:  As directed   Diet - low sodium heart healthy    Complete by:  As directed   Discharge instructions    Complete by:  As directed   Take medications as prescribed at this time as there as been several changes to your previous medication list    Increase activity slowly    Complete by:  As directed        Discharge Medications       Medication List    STOP taking these medications   lisinopril 10 MG tablet Commonly known as:  PRINIVIL,ZESTRIL     TAKE these medications   apixaban 5 MG Tabs tablet Commonly known as:  ELIQUIS Take 1 tablet (5 mg total) by mouth 2 (two) times daily. What changed:  medication strength  how much to take   CALCIUM PO Take 1 tablet by mouth daily.   carvedilol 3.125 MG tablet Commonly known as:  COREG Take 1 tablet (3.125 mg total) by mouth 2 (two) times daily with a meal. What changed:  medication strength  how much to take   diclofenac sodium 1 % Gel Commonly known as:  VOLTAREN Apply 1 application topically 3 (three) times daily as needed (pain, inflammation).   diphenhydramine-acetaminophen 25-500 MG Tabs tablet Commonly known as:  TYLENOL PM Take 1 tablet by mouth at bedtime as needed (sleep).   dorzolamide 2 % ophthalmic solution Commonly known as:  TRUSOPT Place 1 drop into both eyes 2 (two) times daily.   furosemide 40 MG tablet Commonly known as:  LASIX Take 40mg  (1 tablet) in the AM and 20mg  (1/2 tablet) in the PM   LAXATIVE PO Take 1 tablet by mouth daily as needed (for constipation).   spironolactone 25 MG tablet Commonly known as:  ALDACTONE Take 0.5 tablets (12.5 mg total) by mouth at bedtime.   timolol 0.5 % ophthalmic solution Commonly known as:  TIMOPTIC Place 1 drop into both eyes daily.   TYLENOL 325 MG Caps Generic drug:  Acetaminophen Take 325 mg by mouth daily as needed (for shoulder pain).   VITAMIN C PO Take 1 tablet by mouth daily. Reported on 12/19/2015       Major procedures and Radiology Reports -  PLEASE review detailed and final reports for all details, in brief -   Ct Angio Head W/cm &/or Wo Cm  Result Date: 04/13/2016 CLINICAL DATA:  Bilateral blurry vision yesterday. History of diabetes, hypertension, atrial fibrillation  and stroke. EXAM: CT ANGIOGRAPHY HEAD AND NECK TECHNIQUE: Multidetector CT imaging of the head and neck was performed using the standard protocol during bolus administration of intravenous contrast. Multiplanar CT image reconstructions and MIPs were obtained to evaluate the vascular anatomy. Carotid stenosis measurements (when applicable) are obtained utilizing NASCET criteria, using the distal internal carotid diameter as the denominator. CONTRAST:  50 cc Isovue 370 COMPARISON:  CT HEAD April 12, 2016 FINDINGS: CT HEAD INTRACRANIAL CONTENTS: The ventricles and sulci are normal for age. No intraparenchymal hemorrhage, mass effect nor midline shift. Patchy supratentorial white matter hypodensities are less than expected for patient's age and though non-specific likely represent chronic small vessel ischemic disease. No acute large vascular territory infarcts. No abnormal extra-axial fluid collections. Basal cisterns are patent. Moderate calcific atherosclerosis of the carotid siphons. ORBITS: The included ocular globes and orbital contents are non-suspicious. Status post bilateral ocular lens implants. SINUSES: The mastoid aircells and included paranasal sinuses are well-aerated. SKULL/SOFT TISSUES: No skull fracture. No significant soft tissue swelling. Scout view demonstrates RIGHT shoulder arthroplasty. 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 CTA NECK AORTIC ARCH: Normal appearance of the thoracic arch, normal branch pattern. Mild calcific atherosclerosis of the aortic arch. The origins of the innominate, left Common carotid artery and subclavian artery are widely patent. RIGHT CAROTID SYSTEM: Common carotid artery is widely patent, coursing in a straight line fashion. Normal appearance of the carotid bifurcation without hemodynamically significant stenosis by NASCET criteria. Trace calcific atherosclerosis. Normal appearance of the included internal carotid artery. LEFT CAROTID SYSTEM:  Common carotid artery is widely patent, coursing in a straight line fashion. Normal appearance of the carotid bifurcation without hemodynamically significant stenosis by NASCET criteria. Trace calcific atherosclerosis. Normal appearance of the included internal carotid artery. VERTEBRAL ARTERIES:Left vertebral artery is dominant. Normal appearance of the vertebral arteries, which appear widely patent. SKELETON: No acute osseous process though bone windows have not been submitted. Multiple absent teeth. Moderate to severe C4-5 through C6-7 disc height loss, uncovertebral hypertrophy compatible with degenerative disc. Moderate to severe upper LEFT cervical facet arthropathy. No destructive bony lesions. OTHER NECK: Soft tissues of the neck are non-acute though, not tailored for evaluation. RIGHT retropectoral lymphadenopathy measure up to 12 mm short axis. Trace suspected LEFT pleural effusion. Cardiac pacemaker via LEFT subclavian venous approach. Sub cm RIGHT thyroid nodule below size followup recommendations. Patulous esophagus with air-fluid level partially imaged. CTA HEAD ANTERIOR CIRCULATION: Normal appearance of the cervical internal carotid arteries, petrous, cavernous and supra clinoid internal carotid arteries. Widely patent anterior communicating artery. Normal appearance of the anterior and middle cerebral arteries. No large vessel occlusion, hemodynamically significant stenosis, dissection, luminal irregularity, contrast extravasation or aneurysm. POSTERIOR CIRCULATION: Normal appearance of the vertebral arteries, vertebrobasilar junction and basilar artery, as well as main branch vessels. Normal appearance of the posterior cerebral arteries. No large vessel occlusion, hemodynamically significant stenosis, dissection, luminal irregularity, contrast extravasation or aneurysm. VENOUS SINUSES: Major dural venous sinuses are patent though not tailored for evaluation on this angiographic examination. ANATOMIC  VARIANTS: None. DELAYED PHASE: No abnormal intracranial enhancement. IMPRESSION: CTA NECK: No hemodynamically significant stenosis or acute vascular process. RIGHT retropectoral lymphadenopathy partially imaged. Recommend correlation with mammography on a nonemergent basis in dedicated follow-up. Patulous esophagus with air-fluid level presents aspiration  risk. CTA HEAD: Negative. Electronically Signed   By: Elon Alas M.D.   On: 04/13/2016 19:02   Dg Chest 2 View  Result Date: 03/24/2016 CLINICAL DATA:  Shortness of breath and dizziness beginning this morning. EXAM: CHEST  2 VIEW COMPARISON:  PA and lateral chest 10/08/2014. FINDINGS: The lungs are clear. There is cardiomegaly. Pacing device is in place. No pneumothorax or pleural effusion. Aortic atherosclerosis is seen. Thoracic spondylosis noted. IMPRESSION: Cardiomegaly without acute disease. Atherosclerosis. Electronically Signed   By: Inge Rise M.D.   On: 03/24/2016 13:13  Ct Angio Neck W/cm &/or Wo/cm  Result Date: 04/13/2016 CLINICAL DATA:  Bilateral blurry vision yesterday. History of diabetes, hypertension, atrial fibrillation and stroke. EXAM: CT ANGIOGRAPHY HEAD AND NECK TECHNIQUE: Multidetector CT imaging of the head and neck was performed using the standard protocol during bolus administration of intravenous contrast. Multiplanar CT image reconstructions and MIPs were obtained to evaluate the vascular anatomy. Carotid stenosis measurements (when applicable) are obtained utilizing NASCET criteria, using the distal internal carotid diameter as the denominator. CONTRAST:  50 cc Isovue 370 COMPARISON:  CT HEAD April 12, 2016 FINDINGS: CT HEAD INTRACRANIAL CONTENTS: The ventricles and sulci are normal for age. No intraparenchymal hemorrhage, mass effect nor midline shift. Patchy supratentorial white matter hypodensities are less than expected for patient's age and though non-specific likely represent chronic small vessel ischemic  disease. No acute large vascular territory infarcts. No abnormal extra-axial fluid collections. Basal cisterns are patent. Moderate calcific atherosclerosis of the carotid siphons. ORBITS: The included ocular globes and orbital contents are non-suspicious. Status post bilateral ocular lens implants. SINUSES: The mastoid aircells and included paranasal sinuses are well-aerated. SKULL/SOFT TISSUES: No skull fracture. No significant soft tissue swelling. Scout view demonstrates RIGHT shoulder arthroplasty. 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 CTA NECK AORTIC ARCH: Normal appearance of the thoracic arch, normal branch pattern. Mild calcific atherosclerosis of the aortic arch. The origins of the innominate, left Common carotid artery and subclavian artery are widely patent. RIGHT CAROTID SYSTEM: Common carotid artery is widely patent, coursing in a straight line fashion. Normal appearance of the carotid bifurcation without hemodynamically significant stenosis by NASCET criteria. Trace calcific atherosclerosis. Normal appearance of the included internal carotid artery. LEFT CAROTID SYSTEM: Common carotid artery is widely patent, coursing in a straight line fashion. Normal appearance of the carotid bifurcation without hemodynamically significant stenosis by NASCET criteria. Trace calcific atherosclerosis. Normal appearance of the included internal carotid artery. VERTEBRAL ARTERIES:Left vertebral artery is dominant. Normal appearance of the vertebral arteries, which appear widely patent. SKELETON: No acute osseous process though bone windows have not been submitted. Multiple absent teeth. Moderate to severe C4-5 through C6-7 disc height loss, uncovertebral hypertrophy compatible with degenerative disc. Moderate to severe upper LEFT cervical facet arthropathy. No destructive bony lesions. OTHER NECK: Soft tissues of the neck are non-acute though, not tailored for evaluation. RIGHT retropectoral  lymphadenopathy measure up to 12 mm short axis. Trace suspected LEFT pleural effusion. Cardiac pacemaker via LEFT subclavian venous approach. Sub cm RIGHT thyroid nodule below size followup recommendations. Patulous esophagus with air-fluid level partially imaged. CTA HEAD ANTERIOR CIRCULATION: Normal appearance of the cervical internal carotid arteries, petrous, cavernous and supra clinoid internal carotid arteries. Widely patent anterior communicating artery. Normal appearance of the anterior and middle cerebral arteries. No large vessel occlusion, hemodynamically significant stenosis, dissection, luminal irregularity, contrast extravasation or aneurysm. POSTERIOR CIRCULATION: Normal appearance of the vertebral arteries, vertebrobasilar junction and basilar artery, as well as main branch vessels.  Normal appearance of the posterior cerebral arteries. No large vessel occlusion, hemodynamically significant stenosis, dissection, luminal irregularity, contrast extravasation or aneurysm. VENOUS SINUSES: Major dural venous sinuses are patent though not tailored for evaluation on this angiographic examination. ANATOMIC VARIANTS: None. DELAYED PHASE: No abnormal intracranial enhancement. IMPRESSION: CTA NECK: No hemodynamically significant stenosis or acute vascular process. RIGHT retropectoral lymphadenopathy partially imaged. Recommend correlation with mammography on a nonemergent basis in dedicated follow-up. Patulous esophagus with air-fluid level presents aspiration risk. CTA HEAD: Negative. Electronically Signed   By: Elon Alas M.D.   On: 04/13/2016 19:02   Ct Head Code Stroke Wo Contrast  Addendum Date: 04/12/2016   ADDENDUM REPORT: 04/12/2016 15:50 ADDENDUM: Study discussed by telephone with Dr. Roland Rack on 04/12/2016 at 1548 hours. Electronically Signed   By: Genevie Ann M.D.   On: 04/12/2016 15:50   Result Date: 04/12/2016 CLINICAL DATA:  Code stroke. 82 year old female with vision loss.  Initial encounter. EXAM: CT HEAD WITHOUT CONTRAST TECHNIQUE: Contiguous axial images were obtained from the base of the skull through the vertex without intravenous contrast. COMPARISON:  Head CT without contrast 11/23/2015, and earlier. FINDINGS: Visualized paranasal sinuses and mastoids are stable and well pneumatized. No acute osseous abnormality identified. Stable and negative visualized orbits soft tissues. Visualized scalp soft tissues are within normal limits. Calcified atherosclerosis at the skull base. Cerebral volume remains normal for age. No midline shift, ventriculomegaly, mass effect, evidence of mass lesion, intracranial hemorrhage or evidence of cortically based acute infarction. Gray-white matter differentiation is within normal limits throughout the brain. No suspicious intracranial vascular hyperdensity. ASPECTS [facility] Stroke Program Early CT Score) Total score (0-10 with 10 being normal): 10 IMPRESSION: 1. Stable and normal for age Normal noncontrast CT appearance of the brain. 2. ASPECTS is 10. Electronically Signed: By: Genevie Ann M.D. On: 04/12/2016 15:47    Micro Results   Recent Results (from the past 240 hour(s))  Urine culture     Status: Abnormal   Collection Time: 04/12/16  5:30 PM  Result Value Ref Range Status   Specimen Description URINE, RANDOM  Final   Special Requests NONE  Final   Culture MULTIPLE SPECIES PRESENT, SUGGEST RECOLLECTION (A)  Final   Report Status 04/13/2016 FINAL  Final       Today   Subjective    Anayjah Zakrzewski today has no New complaints, no chest pain or palpitations. No shortness of breath, no fever chills and no dysuria          Patient has been seen and examined prior to discharge   Objective   Blood pressure 97/60, pulse 73, temperature 98.7 F (37.1 C), temperature source Oral, resp. rate 16, height 5\' 5"  (1.651 m), weight 74.5 kg (164 lb 4.8 oz), SpO2 100 %.  No intake or output data in the 24 hours ending 04/16/16  1227  Exam Gen:- Awake  In no apparent distress  HEENT:- [place].AT,   Neck-Supple Neck,No JVD,  Lungs- mostly clear  CV- S1, S2 normal, irregular Abd-  +ve B.Sounds, Abd Soft, No tenderness,    Extremity/Skin:- Intact peripheral pulses   NeuroPsych- no new focal deficits   Data Review   CBC w Diff:  Lab Results  Component Value Date   WBC 4.6 04/15/2016   HGB 10.3 (L) 04/15/2016   HCT 33.2 (L) 04/15/2016   PLT 239 04/15/2016   LYMPHOPCT 23 04/15/2016   MONOPCT 8 04/15/2016   EOSPCT 4 04/15/2016   BASOPCT 1 04/15/2016    CMP:  Lab Results  Component Value Date   NA 140 04/16/2016   K 4.0 04/16/2016   CL 108 04/16/2016   CO2 23 04/16/2016   BUN 8 04/16/2016   CREATININE 0.96 04/16/2016   CREATININE 0.98 (H) 12/29/2015   PROT 7.2 04/12/2016   ALBUMIN 3.4 (L) 04/12/2016   BILITOT 1.1 04/12/2016   ALKPHOS 54 04/12/2016   AST 21 04/12/2016   ALT 10 (L) 04/12/2016  .   Total Discharge time is about 33 minutes  Omesha Bowerman M.D on 04/16/2016 at 12:27 PM  [facility]   Office  [phone]  Dragon dictation system was used to create this note, attempts have been made to correct errors, however presence of uncorrected errors is not a reflection quality of care provided

## 2016-04-16 NOTE — Progress Notes (Signed)
Occupational Therapy Treatment Patient Details Name: Kim Mullins MRN: [id number] DOB: 03/17/1930 Today's Date: 04/16/2016    History of present illness pt is an 82 y/o female with pmh of CHF/NICM, complete heart block post pacer, Paroxysmal afib, prior CVA/TOA presented to the ED with sudden onset of blurry vision.  Symptoms improved in ED and TpA not given.  CT showed no acute abnormalities, unable to have MRI.   OT comments  Pt making good progress towards OT goals. Pt verbalized concerns and confusion about changes made to her medications and therapist offered to assist pt in creating a new medication management list as this is how pt keeps track of her medication at home. Asked RN prior and obtained and verified medication list. Pt wrote the name, purpose, dosage, and frequency of each medication and highlighted the medications that had an aspect of them altered since admission. Pt grateful for therapist assistance with this and reported she felt confident in being able to make these changes at home. RN agreed to double check pt-made list for accuracy. Will continue to follow pt acutely to fully address all OT goals and needs.   Follow Up Recommendations  Home health OT;Supervision - Intermittent    Equipment Recommendations  None recommended by OT    Recommendations for Other Services      Precautions / Restrictions Precautions Precautions: Fall Restrictions Weight Bearing Restrictions: No       Mobility Bed Mobility Overal bed mobility: Modified Independent Bed Mobility: Supine to Sit;Sit to Supine           General bed mobility comments: Increased effort  Transfers                      Balance                                   ADL                                         General ADL Comments: Pt verbalized concerns over changes to her medications and reported that she normally keeps track of these changes in her  notebook at home. Pt appreciative of assistance from therapist to create new medication list with changes (i.e. dosages, frequency) highlighted. RN aware and thankful for OT assistance and agreeable to review list made.      Vision                 Additional Comments: Re-tested vision with screening tools and during functional tasks. Pt demonstrated some L peripheral visual field deficits and made tracking errors in L lower quadrant, however pt feels her visual issues have resolved. Strongly suggested pt return to her eye doctor for full visual field test.   Perception     Praxis      Cognition   Behavior During Therapy: [facility] for tasks assessed/performed;Anxious Overall Cognitive Status: Within Functional Limits for tasks assessed                       Extremity/Trunk Assessment               Exercises     Shoulder Instructions       General Comments      Pertinent Vitals/ Pain  Pain Assessment: No/denies pain  Home Living                                          Prior Functioning/Environment              Frequency Min 2X/week     Progress Toward Goals  OT Goals(current goals can now be found in the care plan section)  Progress towards OT goals: Progressing toward goals  Acute Rehab OT Goals Patient Stated Goal: back home OT Goal Formulation: With patient Time For Goal Achievement: 04/27/16 Potential to Achieve Goals: Good ADL Goals Pt Will Perform Lower Body Dressing: with set-up;sit to/from stand Pt Will Transfer to Toilet: with modified independence;ambulating;grab bars;regular height toilet Pt Will Perform Toileting - Clothing Manipulation and hygiene: with modified independence;sitting/lateral leans Additional ADL Goal #1: Pt will verbalize understanding of 3 compensatory techniques to increase contrast and lighting and reduce slutter to iincrease safety adn functional vision  Plan Discharge plan remains  appropriate    Co-evaluation                 End of Session     Activity Tolerance Patient tolerated treatment well   Patient Left in bed;with call bell/phone within reach   Nurse Communication Mobility status        Time: [id number] OT Time Calculation (min): 43 min  Charges: OT General Charges $OT Visit: 1 Procedure OT Treatments $Self Care/Home Management : 38-52 mins  Redmond Baseman, OTR/L Pager: [phone] 04/16/2016, 3:36 PM

## 2016-04-16 NOTE — Progress Notes (Signed)
Advanced Heart Failure Rounding Note  Referring Physician: Dr Grandville Silos PCP: Dr. Ayesha Rumpf Cardiology: Dr. Radford Pax HF Cardiology: Dr. Aundra Dubin  Reason for Consultation: Systolic HF/ TIAs / BP   Subjective:    Admitted 04/12/16 with TIA. Spontaneously resolved. Now off BP meds with hypotension. Diuretics held as well.   Feels good today.  Occasional lightheadedness. No further TIA symptoms. No bleeding on increased eliquis. BP more stable in 100s.   Eliquis increased to 5 mg BID 04/13/16.  Objective:   Weight Range: 164 lb 4.8 oz (74.5 kg) Body mass index is 27.34 kg/m.   Vital Signs:   Temp:  [97.6 F (36.4 C)-99.8 F (37.7 C)] 98.7 F (37.1 C) (08/18 1033) Pulse Rate:  [63-80] 73 (08/18 1033) Resp:  [16-20] 16 (08/18 1033) BP: (97-128)/(60-90) 97/60 (08/18 1033) SpO2:  [96 %-100 %] 100 % (08/18 1033) Last BM Date: 04/13/16  Weight change: Filed Weights   04/12/16 2121 04/14/16 0547 04/15/16 0416  Weight: 164 lb 7.4 oz (74.6 kg) 163 lb 12.8 oz (74.3 kg) 164 lb 4.8 oz (74.5 kg)    Intake/Output:  No intake or output data in the 24 hours ending 04/16/16 1126   Physical Exam: General: NAD, Elderly appearing Neck: JVP 7-8 cm, no thyromegaly or thyroid nodule.  Lungs: Clear, NAD  CV: Nondisplaced PMI. Heart regular S1/S2, no S3/S4, no murmur. No peripheral edema. No carotid bruit. Normal pedal pulses.  Abdomen: Soft, NT, ND, no HSM. No bruits or masses. +BS  Skin: Intact without lesions or rashes.  Neurologic: Alert and oriented x 3.  Psych: Normal affect. Extremities: No clubbing or cyanosis.  HEENT: Normal.   Telemetry: Reviewed, NSR  Labs: CBC  Recent Labs  04/15/16 0551  WBC 4.6  NEUTROABS 3.0  HGB 10.3*  HCT 33.2*  MCV 90.0  PLT [id number]   Basic Metabolic Panel  Recent Labs  04/15/16 0551 04/16/16 0601  NA 139 140  K 4.2 4.0  CL 111 108  CO2 21* 23  GLUCOSE 91 99  BUN 9 8  CREATININE 0.99 0.96  CALCIUM 8.8* 8.8*   Liver Function  Tests No results for input(s): AST, ALT, ALKPHOS, BILITOT, PROT, ALBUMIN in the last 72 hours. No results for input(s): LIPASE, AMYLASE in the last 72 hours. Cardiac Enzymes  Recent Labs  04/13/16 1351  TROPONINI 0.09*    BNP: BNP (last 3 results)  Recent Labs  03/26/16 0901  BNP 304.3*    ProBNP (last 3 results) No results for input(s): PROBNP in the last 8760 hours.   D-Dimer No results for input(s): DDIMER in the last 72 hours. Hemoglobin A1C No results for input(s): HGBA1C in the last 72 hours. Fasting Lipid Panel No results for input(s): CHOL, HDL, LDLCALC, TRIG, CHOLHDL, LDLDIRECT in the last 72 hours. Thyroid Function Tests No results for input(s): TSH, T4TOTAL, T3FREE, THYROIDAB in the last 72 hours.  Invalid input(s): FREET3  Other results:     Imaging/Studies:  No results found.  Latest Echo  Latest Cath   Medications:     Scheduled Medications: . apixaban  5 mg Oral BID  . carvedilol  3.125 mg Oral BID WC  . ciprofloxacin  400 mg Intravenous Q12H  . ciprofloxacin  500 mg Oral BID  . dorzolamide  1 drop Both Eyes BID  . furosemide  40 mg Oral Daily  . timolol  1 drop Both Eyes Daily    Infusions:    PRN Medications: acetaminophen, ondansetron, senna-docusate   Assessment/Plan  82 yo with history of chronic systolic CHF/nonischemic cardiomyopathy, prior complete heart block, OSA, and paroxysmal atrial fibrillation who presented to [facility] 04/12/16 with TIA.   1. TIA versus CVA: Visual symptoms resolved. ?hypoperfusion vs underdosing of Eliquis.  - CTA head/neck with unremarkable except for incidental retropectoral lymphadenopathy.  - Continue Eliquis 5 mg bid  2. Chronic systolic CHF: Nonischemic cardiomyopathy.  EF up to 40-45% on echo this admission, from 15-20% on prior echo. She is now off Coreg, lisinopril, and spironolactone with soft BP.  Currently, SBP in 90s.   - Hold lisinopril for now. Can try to add back as outpatient.  -  Resume spiro 12.5 mg daily.  - Will resume home lasix 40/20 mg am/pm.  - Continue low dose coreg 3.125 mg BID for now.  - Has Medtronic CRT-P device.  3. Atrial fibrillation: Paroxysmal.  NSR.  - Continue Eliquis 5 mg bid.   HF meds for d/c Eliquis 5 mg BID Spiro 12.5 mg daily Lasix 40 mg q am and 20 mg q pm.  Coreg 3.125 mg BID  Will schedule for close HF follow up.   Length of Stay: 2  Shirley Friar PA-C 04/16/2016, 11:26 AM  Advanced Heart Failure Team Pager [phone] (M-F; 7a - 4p)  Please contact [facility] for night-coverage after hours (4p -7a ) and weekends on [email or web address]  Patient seen with PA, agree with the above note.  BP stable, no dyspnea.  We will resume spironolactone 12.5 daily, Lasix 40 qam/20 qpm and lower dose Coreg 3.125 mg bid.  Will hold off on lisinopril for now, can restart low dose as outpatient.  She is now on appropriate dose of Eliquis.    Suspect she will be able to go home today.  Will schedule HF clinic followup.   [name] 04/16/2016 12:19 PM

## 2016-04-16 NOTE — Progress Notes (Signed)
PHARMACIST - PHYSICIAN COMMUNICATION DR:   Denton Brick CONCERNING: Antibiotic IV to Oral Route Change Policy  RECOMMENDATION: This patient is receiving cipro by the intravenous route.  Based on criteria approved by the Pharmacy and Therapeutics Committee, the antibiotic(s) is/are being converted to the equivalent oral dose form(s).   DESCRIPTION: These criteria include:  Patient being treated for a respiratory tract infection, urinary tract infection, cellulitis or clostridium difficile associated diarrhea if on metronidazole  The patient is not neutropenic and does not exhibit a GI malabsorption state  The patient is eating (either orally or via tube) and/or has been taking other orally administered medications for a least 24 hours  The patient is improving clinically and has a Tmax < 100.5  If you have questions about this conversion, please contact the Pharmacy Department  []   [phone] )  [facility] []   [phone] )  [facility] [x]   [phone] )  [facility] []   [phone] )  [facility] []   [phone] )  [place]. Diona Foley, PharmD, BCPS Clinical Pharmacist

## 2016-04-19 ENCOUNTER — Telehealth (HOSPITAL_COMMUNITY): Payer: Self-pay | Admitting: *Deleted

## 2016-04-19 NOTE — Telephone Encounter (Signed)
Received phone call from case manager inpatient [place].  Pt presently in the hospital for ?CVA.  Pt had reported to the case manager that she was referred to cardiac rehab but had not heard from anyone.  Advised case manager that pt referral was discovered electronically and she would be contacted after discharge.  Call pt on 8/21.  Pt given CPT code to contact [facility] for benefits and financial responsibility for cardiac rehab. Pt to return call to rehab to set up appt after verifying benefits. Contact information provided. Cherre Huger, BSN

## 2016-04-19 NOTE — Telephone Encounter (Signed)
Pt called to clarify what dose of Furosemide she should be taking, she states it was 40/20 but something else says 40 daily.  Per Dr Claris Gladden note from 8/18:   HF meds for d/c Eliquis 5 mg BID Spiro 12.5 mg daily Lasix 40 mg q am and 20 mg q pm.  Coreg 3.125 mg BID  Advised pt should be 40/20, she is agreeable and will continue this dose.

## 2016-04-26 ENCOUNTER — Ambulatory Visit (HOSPITAL_COMMUNITY)
Admission: RE | Admit: 2016-04-26 | Discharge: 2016-04-26 | Disposition: A | Discharge status: Home / Self Care | Payer: Medicare Other | Source: Ambulatory Visit | Attending: Cardiology | Admitting: Cardiology

## 2016-04-26 VITALS — BP 114/80 | HR 92 | Wt 157.0 lb

## 2016-04-26 DIAGNOSIS — Z79899 Other long term (current) drug therapy: Secondary | ICD-10-CM | POA: Insufficient documentation

## 2016-04-26 DIAGNOSIS — Z7901 Long term (current) use of anticoagulants: Secondary | ICD-10-CM | POA: Diagnosis not present

## 2016-04-26 DIAGNOSIS — I428 Other cardiomyopathies: Secondary | ICD-10-CM | POA: Diagnosis not present

## 2016-04-26 DIAGNOSIS — I480 Paroxysmal atrial fibrillation: Secondary | ICD-10-CM

## 2016-04-26 DIAGNOSIS — K219 Gastro-esophageal reflux disease without esophagitis: Secondary | ICD-10-CM | POA: Insufficient documentation

## 2016-04-26 DIAGNOSIS — I442 Atrioventricular block, complete: Secondary | ICD-10-CM | POA: Insufficient documentation

## 2016-04-26 DIAGNOSIS — G4733 Obstructive sleep apnea (adult) (pediatric): Secondary | ICD-10-CM | POA: Insufficient documentation

## 2016-04-26 DIAGNOSIS — Z8673 Personal history of transient ischemic attack (TIA), and cerebral infarction without residual deficits: Secondary | ICD-10-CM | POA: Diagnosis not present

## 2016-04-26 DIAGNOSIS — I5022 Chronic systolic (congestive) heart failure: Secondary | ICD-10-CM | POA: Insufficient documentation

## 2016-04-26 DIAGNOSIS — R769 Abnormal immunological finding in serum, unspecified: Secondary | ICD-10-CM | POA: Diagnosis not present

## 2016-04-26 DIAGNOSIS — Z87891 Personal history of nicotine dependence: Secondary | ICD-10-CM | POA: Insufficient documentation

## 2016-04-26 DIAGNOSIS — E119 Type 2 diabetes mellitus without complications: Secondary | ICD-10-CM | POA: Diagnosis not present

## 2016-04-26 DIAGNOSIS — R778 Other specified abnormalities of plasma proteins: Secondary | ICD-10-CM

## 2016-04-26 DIAGNOSIS — Z950 Presence of cardiac pacemaker: Secondary | ICD-10-CM | POA: Insufficient documentation

## 2016-04-26 LAB — BASIC METABOLIC PANEL
Anion gap: 8 (ref 5–15)
BUN: 16 mg/dL (ref 6–20)
CALCIUM: 9 mg/dL (ref 8.9–10.3)
CO2: 24 mmol/L (ref 22–32)
Chloride: 104 mmol/L (ref 101–111)
Creatinine, Ser: 1.2 mg/dL — ABNORMAL HIGH (ref 0.44–1.00)
GFR, EST AFRICAN AMERICAN: 47 mL/min — AB (ref >60)
GFR, EST NON AFRICAN AMERICAN: 41 mL/min — AB (ref >60)
GLUCOSE: 103 mg/dL — AB (ref 65–99)
Potassium: 4.2 mmol/L (ref 3.5–5.1)
Sodium: 136 mmol/L (ref 135–145)

## 2016-04-26 LAB — CBC
HEMATOCRIT: 37.9 % (ref 36.0–46.0)
Hemoglobin: 12 g/dL (ref 12.0–15.0)
MCH: 28.2 pg (ref 26.0–34.0)
MCHC: 31.7 g/dL (ref 30.0–36.0)
MCV: 89 fL (ref 78.0–100.0)
Platelets: 278 10*3/uL (ref 150–400)
RBC: 4.26 MIL/uL (ref 3.87–5.11)
RDW: 14.7 % (ref 11.5–15.5)
WBC: 3.8 10*3/uL — ABNORMAL LOW (ref 4.0–10.5)

## 2016-04-26 MED ORDER — LISINOPRIL 2.5 MG PO TABS: 2.5000 mg | ORAL_TABLET | Freq: Every evening | ORAL | 3 refills | Status: DC

## 2016-04-26 NOTE — Progress Notes (Signed)
v

## 2016-04-26 NOTE — Patient Instructions (Signed)
Start Lisinopril 2.5 mg every evening  Your physician has requested that you regularly monitor and record your blood pressure readings at home. Please use the same machine at the same time of day to check your readings and record them to bring to your follow-up visit.  IF YOU NOTICE IT IS STAYING UNDER 90 (THE TOP NUMBER) PLEASE CALL AND LET us KNOW  Labs today  Your physician recommends that you schedule a follow-up appointment in: 1 month

## 2016-04-26 NOTE — Progress Notes (Signed)
Patient ID: Kim Mullins, female   DOB: March 03, 1930, 82 y.o.   MRN: [id number] PCP: Dr. Ayesha Rumpf Cardiology: Dr. Radford Pax HF Cardiology: Dr. Aundra Dubin  82 yo with history of chronic systolic CHF/nonischemic cardiomyopathy, prior complete heart block, OSA, and paroxysmal atrial fibrillation presents for CHF clinic evaluation.  She was diagnosed with a cardiomyopathy back in 2015.  She had right and left heart cath in 12/15, showing no significant coronary disease.  EF was in the 30-35% range.  She was admitted in 2/16 with symptomatic bradycardia/complete heart block and had Medtronic CRT-P placed.  In 4/16, EF was back up to 50-55%.  However, repeat echo in 6/17 showed EF down to 15-20% with RV dysfunction.    She was admitted in 8/17 with suspected TIA => dysarthria and blurred vision.  She had been on Eliquis 2.5 mg bid, this was increased to 5 mg bid.  Lisinopril was stopped and Coreg was decreased due to soft BP.  Echo was done this admission, showing improvement in EF to 40-45%.    She is now at home again.  She is short of breath after walking about 50 yards.  No orthopnea/PND.  No current neurological symptoms. No lightheadedness or dizziness.  She uses a walker and is moving more slowly than prior to recent admission.  No BRBPR or melena.  Weight is down 3 lbs.   Optivol: Fluid index < threshold, impedance stable, >99% BiV pacing, no atrial fibrillation.   Labs (3/17): HCT 36.9 Labs (5/17): K 4.2, creatinine 0.98 Labs (7/17): K 4.7, creatinine 1.11, BNP 304 Labs (8/17): K 4, creatinine 0.96, hgb 10.3, LDL 46, M-spike on SPEP.   PMH: 1. Type II diabetes: Diet-controlled.  2. GERD: s/p esophageal dilatation.  3. Chronic systolic CHF: 1st noted in 2015. Nonischemic cardiomyopathy.  - Echo (11/15) with EF 40-45%.  - LHC/RHC (12/15): Normal coronaries, EF 30-35%, mean RA 9, PA 44/16 mean 31, unable to obtain PCWP, CI 2.5.  - Developed CHB and had Medtronic CRT-P placed in 2/16. - Echo (4/16)  with EF 50-55%.  - Echo (6/17): EF 15-20%, moderate LVh, restrictive diastolic function, mild MR, RV moderately dilated and moderately decreased in function, moderate TR, PASP 46 mmHg.  - Echo (8/17): EF 40-45%, diffuse hypokinesis, mild MR.  4. Complete heart block: Medtronic CRT-P in 2/16.  5. OSA: Uses CPAP 6. Asthma 7. Atrial fibrillation: Paroxysmal.  8. H/o CVA.  Possible TIA in 8/17.    - Carotid dopplers (8/17) with 1-39% BICA stenosis.   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   FH: No cardiac problems that she knows of.   ROS: All systems reviewed and negative except as per HPI.   Current Outpatient Prescriptions  Medication Sig Dispense Refill  . Acetaminophen (TYLENOL) 325 MG CAPS Take 325 mg by mouth daily as needed (for shoulder pain).     Marland Kitchen apixaban (ELIQUIS) 5 MG TABS tablet Take 1 tablet (5 mg total) by mouth 2 (two) times daily. 60 tablet 6  . Ascorbic Acid (VITAMIN C PO) Take 1 tablet by mouth daily. Reported on 12/19/2015    . Bisacodyl (LAXATIVE PO) Take 1 tablet by mouth daily as needed (for constipation).     . CALCIUM PO Take 1 tablet by mouth daily.    . carvedilol (COREG) 3.125 MG tablet Take 1 tablet (3.125 mg total) by mouth 2 (two) times daily with a meal. 60 tablet 6  .  diclofenac sodium (VOLTAREN) 1 % GEL Apply 2 g topically 3 (three) times daily as needed (pain, inflammation). 100 g 0  . diphenhydramine-acetaminophen (TYLENOL PM) 25-500 MG TABS tablet Take 1 tablet by mouth at bedtime as needed (sleep).     . dorzolamide (TRUSOPT) 2 % ophthalmic solution Place 1 drop into both eyes 2 (two) times daily.   11  . furosemide (LASIX) 40 MG tablet Take 40mg  (1 tablet) in the AM and 20mg  (1/2 tablet) in the PM 135 tablet 3  . spironolactone (ALDACTONE) 25 MG tablet Take 0.5 tablets (12.5 mg total) by mouth at bedtime. 15 tablet 6  . timolol (TIMOPTIC) 0.5 % ophthalmic solution Place 1 drop  into both eyes daily.   11  . lisinopril (PRINIVIL,ZESTRIL) 2.5 MG tablet Take 1 tablet (2.5 mg total) by mouth every evening. 30 tablet 3   No current facility-administered medications for this encounter.    BP 114/80 (BP Location: Left Arm, Patient Position: Sitting, Cuff Size: Normal)   Pulse 92   Wt 157 lb (71.2 kg)   SpO2 97%   BMI 26.13 kg/m  General: NAD Neck: No JVD, no thyromegaly or thyroid nodule.  Lungs: Clear to auscultation bilaterally with normal respiratory effort. CV: Nondisplaced PMI.  Heart regular S1/S2, no S3/S4, no murmur.  No peripheral edema.  No carotid bruit.  Normal pedal pulses.  Abdomen: Soft, nontender, no hepatosplenomegaly, no distention.  Skin: Intact without lesions or rashes.  Neurologic: Alert and oriented x 3.  Psych: Normal affect. Extremities: No clubbing or cyanosis.  HEENT: Normal.   Assessment/Plan: 1. Chronic systolic CHF: Nonischemic cardiomyopathy, no coronary disease on cath in 12/15. Etiology uncertain => ?viral myocarditis.  She does not remember a family history of cardiomyopathy and has never drunk ETOH heavily.  LV function initially improved in 2016, but EF back down to 15-20% with RV dysfunction on 6/17 echo.  Most recent echo in 8/17 actually showed improved EF to 40-45%.   NYHA class III currently.  She is not volume overloaded on exam or by Optivol.  She is BiV pacing near 100%.  - Continue Coreg 3.125 mg bid. - Continue spironolactone 12.5 daily.  - Restart low dose lisinopril, 2.5 mg to be taken in the evening.  BMET today and repeat in 10 days.  - Continue Lasix 40 qam/20 qpm.  - Abnormal SPEP => will get serum and urine immunofixation.   - Has CRT device, cannot get MRI.  2. Complete heart block: Medtronic CRT-P.  3. Atrial fibrillation: Paroxysmal, in NSR today.  She has history of CVA and TIA, CHADSVASC = 6.  Had suspected TIA in 81/7.  Suspect that apixaban was under-dosed, now increased to 5 mg bid.  4. OSA: Uses CPAP.    Followup in 1 month.   [name] 04/26/2016

## 2016-04-27 LAB — IMMUNOFIXATION, URINE

## 2016-04-28 ENCOUNTER — Encounter: Payer: Self-pay | Admitting: Internal Medicine

## 2016-05-04 ENCOUNTER — Encounter: Payer: Self-pay | Admitting: Internal Medicine

## 2016-05-06 ENCOUNTER — Telehealth (HOSPITAL_COMMUNITY): Payer: Self-pay | Admitting: *Deleted

## 2016-05-06 NOTE — Telephone Encounter (Signed)
Received notice from the heart failure clinic from [place] that pt has [facility]).  Pt is covered for 36 visits.  Called and unable to leave message due to voicemail is full. Pt sent letter to please contact for sign up to cardiac rehab. Cherre Huger, BSN

## 2016-05-11 ENCOUNTER — Encounter: Payer: Self-pay | Admitting: Internal Medicine

## 2016-05-19 ENCOUNTER — Encounter: Payer: Self-pay | Admitting: Internal Medicine

## 2016-05-21 ENCOUNTER — Ambulatory Visit (HOSPITAL_COMMUNITY)
Admission: RE | Admit: 2016-05-21 | Discharge: 2016-05-21 | Disposition: A | Discharge status: Home / Self Care | Payer: Medicare Other | Source: Ambulatory Visit | Attending: Cardiology | Admitting: Cardiology

## 2016-05-21 VITALS — BP 98/64 | HR 84 | Wt 164.8 lb

## 2016-05-21 DIAGNOSIS — Z8673 Personal history of transient ischemic attack (TIA), and cerebral infarction without residual deficits: Secondary | ICD-10-CM | POA: Insufficient documentation

## 2016-05-21 DIAGNOSIS — K219 Gastro-esophageal reflux disease without esophagitis: Secondary | ICD-10-CM | POA: Insufficient documentation

## 2016-05-21 DIAGNOSIS — E119 Type 2 diabetes mellitus without complications: Secondary | ICD-10-CM | POA: Diagnosis not present

## 2016-05-21 DIAGNOSIS — Z79899 Other long term (current) drug therapy: Secondary | ICD-10-CM | POA: Insufficient documentation

## 2016-05-21 DIAGNOSIS — I429 Cardiomyopathy, unspecified: Secondary | ICD-10-CM | POA: Diagnosis not present

## 2016-05-21 DIAGNOSIS — I442 Atrioventricular block, complete: Secondary | ICD-10-CM | POA: Diagnosis not present

## 2016-05-21 DIAGNOSIS — Z7901 Long term (current) use of anticoagulants: Secondary | ICD-10-CM | POA: Diagnosis not present

## 2016-05-21 DIAGNOSIS — J45909 Unspecified asthma, uncomplicated: Secondary | ICD-10-CM | POA: Insufficient documentation

## 2016-05-21 DIAGNOSIS — Z87891 Personal history of nicotine dependence: Secondary | ICD-10-CM | POA: Diagnosis not present

## 2016-05-21 DIAGNOSIS — G4733 Obstructive sleep apnea (adult) (pediatric): Secondary | ICD-10-CM | POA: Diagnosis not present

## 2016-05-21 DIAGNOSIS — I5022 Chronic systolic (congestive) heart failure: Secondary | ICD-10-CM | POA: Diagnosis not present

## 2016-05-21 DIAGNOSIS — I480 Paroxysmal atrial fibrillation: Secondary | ICD-10-CM | POA: Diagnosis not present

## 2016-05-21 LAB — BASIC METABOLIC PANEL
ANION GAP: 9 (ref 5–15)
BUN: 18 mg/dL (ref 6–20)
CALCIUM: 9.1 mg/dL (ref 8.9–10.3)
CO2: 25 mmol/L (ref 22–32)
Chloride: 107 mmol/L (ref 101–111)
Creatinine, Ser: 1.13 mg/dL — ABNORMAL HIGH (ref 0.44–1.00)
GFR, EST AFRICAN AMERICAN: 51 mL/min — AB (ref >60)
GFR, EST NON AFRICAN AMERICAN: 44 mL/min — AB (ref >60)
Glucose, Bld: 104 mg/dL — ABNORMAL HIGH (ref 65–99)
Potassium: 3.9 mmol/L (ref 3.5–5.1)
Sodium: 141 mmol/L (ref 135–145)

## 2016-05-21 MED ORDER — FUROSEMIDE 40 MG PO TABS: 40.0000 mg | ORAL_TABLET | Freq: Two times a day (BID) | ORAL | 3 refills | Status: DC

## 2016-05-21 NOTE — Patient Instructions (Addendum)
START Spironolactone 12.5 mg, (one half tab) daily INCREASE Lasix to 60 mg (and and one half)  In the AM and 40 mg (1 tabs) in the PM for 2 days, then 40 mg twice a day there after  Labs today We will only contact you if something comes back abnormal or we need to make some changes. Otherwise no news is good news!  Your physician recommends that you schedule a follow-up appointment in: 1 month with Dr Aundra Dubin  Do the following things EVERYDAY: 1) Weigh yourself in the morning before breakfast. Write it down and keep it in a log. 2) Take your medicines as prescribed 3) Eat low salt foods-Limit salt (sodium) to 2000 mg per day.  4) Stay as active as you can everyday 5) Limit all fluids for the day to less than 2 liters

## 2016-05-23 NOTE — Progress Notes (Signed)
Patient ID: Kim Mullins, female   DOB: 06-27-1930, 82 y.o.   MRN: [id number] PCP: Dr. Ayesha Rumpf Cardiology: Dr. Radford Pax HF Cardiology: Dr. Aundra Dubin  82 yo with history of chronic systolic CHF/nonischemic cardiomyopathy, prior complete heart block, OSA, and paroxysmal atrial fibrillation presents for CHF clinic evaluation.  She was diagnosed with a cardiomyopathy back in 2015.  She had right and left heart cath in 12/15, showing no significant coronary disease.  EF was in the 30-35% range.  She was admitted in 2/16 with symptomatic bradycardia/complete heart block and had Medtronic CRT-P placed.  In 4/16, EF was back up to 50-55%.  However, repeat echo in 6/17 showed EF down to 15-20% with RV dysfunction.    She was admitted in 8/17 with suspected TIA => dysarthria and blurred vision.  She had been on Eliquis 2.5 mg bid, this was increased to 5 mg bid.  Lisinopril was stopped and Coreg was decreased due to soft BP.  Echo was done that admission, showing improvement in EF to 40-45%.    She is short of breath after walking about 50 yards. She uses a cane or walker. She is short of breath/fatigued with lifting/carrying. No orthopnea/PND.  No current neurological symptoms. No lightheadedness or dizziness.  No BRBPR or melena.  Weight is up 7 lbs.  She is going to be starting cardiac rehab.   Optivol: Fluid index > threshold, impedance lower, >99% BiV pacing, no atrial fibrillation.   Labs (3/17): HCT 36.9 Labs (5/17): K 4.2, creatinine 0.98 Labs (7/17): K 4.7, creatinine 1.11, BNP 304 Labs (8/17): K 4, creatinine 0.96, hgb 10.3, LDL 46, M-spike on SPEP.  Labs (9/17): Urine IFE negative, K 4.2, creatinine 1.2, hgb 12  PMH: 1. Type II diabetes: Diet-controlled.  2. GERD: s/p esophageal dilatation.  3. Chronic systolic CHF: 1st noted in 2015. Nonischemic cardiomyopathy.  - Echo (11/15) with EF 40-45%.  - LHC/RHC (12/15): Normal coronaries, EF 30-35%, mean RA 9, PA 44/16 mean 31, unable to obtain  PCWP, CI 2.5.  - Developed CHB and had Medtronic CRT-P placed in 2/16. - Echo (4/16) with EF 50-55%.  - Echo (6/17): EF 15-20%, moderate LVh, restrictive diastolic function, mild MR, RV moderately dilated and moderately decreased in function, moderate TR, PASP 46 mmHg.  - Echo (8/17): EF 40-45%, diffuse hypokinesis, mild MR.  4. Complete heart block: Medtronic CRT-P in 2/16.  5. OSA: Uses CPAP 6. Asthma 7. Atrial fibrillation: Paroxysmal.  8. H/o CVA.  Possible TIA in 8/17.    - Carotid dopplers (8/17) with 1-39% BICA stenosis.   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   FH: No cardiac problems that she knows of.   ROS: All systems reviewed and negative except as per HPI.   Current Outpatient Prescriptions  Medication Sig Dispense Refill  . Acetaminophen (TYLENOL) 325 MG CAPS Take 325 mg by mouth daily as needed (for shoulder pain).     Marland Kitchen apixaban (ELIQUIS) 5 MG TABS tablet Take 1 tablet (5 mg total) by mouth 2 (two) times daily. 60 tablet 6  . Ascorbic Acid (VITAMIN C PO) Take 1 tablet by mouth daily. Reported on 12/19/2015    . Bisacodyl (LAXATIVE PO) Take 1 tablet by mouth daily as needed (for constipation).     . CALCIUM PO Take 1 tablet by mouth daily.    . carvedilol (COREG) 3.125 MG tablet Take 1 tablet (3.125 mg total) by mouth  2 (two) times daily with a meal. 60 tablet 6  . diclofenac sodium (VOLTAREN) 1 % GEL Apply 2 g topically 3 (three) times daily as needed (pain, inflammation). 100 g 0  . diphenhydramine-acetaminophen (TYLENOL PM) 25-500 MG TABS tablet Take 1 tablet by mouth at bedtime as needed (sleep).     . dorzolamide (TRUSOPT) 2 % ophthalmic solution Place 1 drop into both eyes 2 (two) times daily.   11  . furosemide (LASIX) 40 MG tablet Take 1 tablet (40 mg total) by mouth 2 (two) times daily. 180 tablet 3  . lisinopril (PRINIVIL,ZESTRIL) 2.5 MG tablet Take 1 tablet (2.5 mg total) by mouth every  evening. 30 tablet 3  . spironolactone (ALDACTONE) 25 MG tablet Take 0.5 tablets (12.5 mg total) by mouth at bedtime. 15 tablet 6  . timolol (TIMOPTIC) 0.5 % ophthalmic solution Place 1 drop into both eyes daily.   11   No current facility-administered medications for this encounter.    BP 98/64 (BP Location: Right Arm, Patient Position: Sitting, Cuff Size: Normal)   Pulse 84   Wt 164 lb 12.8 oz (74.8 kg)   SpO2 97%   BMI 27.42 kg/m  General: NAD Neck: JVP 8-9 cm, no thyromegaly or thyroid nodule.  Lungs: Clear to auscultation bilaterally with normal respiratory effort. CV: Nondisplaced PMI.  Heart regular S1/S2, no S3/S4, no murmur.  No peripheral edema.  No carotid bruit.  Normal pedal pulses.  Abdomen: Soft, nontender, no hepatosplenomegaly, no distention.  Skin: Intact without lesions or rashes.  Neurologic: Alert and oriented x 3.  Psych: Normal affect. Extremities: No clubbing or cyanosis.  HEENT: Normal.   Assessment/Plan: 1. Chronic systolic CHF: Nonischemic cardiomyopathy, no coronary disease on cath in 12/15. Etiology uncertain => ?viral myocarditis.  She does not remember a family history of cardiomyopathy and has never drunk ETOH heavily.  LV function initially improved in 2016, but EF back down to 15-20% with RV dysfunction on 6/17 echo.  Most recent echo in 8/17 actually showed improved EF to 40-45%.   NYHA class III currently.  She is volume overloaded today by exam and Optivol.  Weight is up.  She is BiV pacing near 100%.  - Continue Coreg 3.125 mg bid. - Restart spironolactone 12.5 daily.  - Continue lisinopril 2.5 mg to be taken in the evening.   - Increase Lasix to 60 qam/40 qpm x 2 days then 40 mg bid.  - BMET today and again in 10 days.   - Abnormal SPEP => urine immunofixation normal, will send serum immunofixation.   - Has CRT device, cannot get MRI.  2. Complete heart block: Medtronic CRT-P.  3. Atrial fibrillation: Paroxysmal, in NSR today.  She has history  of CVA and TIA, CHADSVASC = 6.  Had suspected TIA in 81/7.  Suspect that apixaban was under-dosed, now increased to 5 mg bid.  4. OSA: Uses CPAP.   Followup in 1 month.   [name] 05/23/2016

## 2016-05-28 ENCOUNTER — Encounter (HOSPITAL_COMMUNITY): Payer: Medicare Other

## 2016-05-31 ENCOUNTER — Ambulatory Visit (HOSPITAL_COMMUNITY)
Admission: RE | Admit: 2016-05-31 | Discharge: 2016-05-31 | Disposition: A | Discharge status: Home / Self Care | Payer: Medicare Other | Source: Ambulatory Visit | Attending: Internal Medicine | Admitting: Internal Medicine

## 2016-05-31 DIAGNOSIS — I5022 Chronic systolic (congestive) heart failure: Secondary | ICD-10-CM

## 2016-05-31 LAB — BASIC METABOLIC PANEL
Anion gap: 10 (ref 5–15)
BUN: 15 mg/dL (ref 6–20)
CHLORIDE: 104 mmol/L (ref 101–111)
CO2: 26 mmol/L (ref 22–32)
CREATININE: 0.99 mg/dL (ref 0.44–1.00)
Calcium: 9.3 mg/dL (ref 8.9–10.3)
GFR calc Af Amer: 59 mL/min — ABNORMAL LOW (ref >60)
GFR calc non Af Amer: 51 mL/min — ABNORMAL LOW (ref >60)
GLUCOSE: 119 mg/dL — AB (ref 65–99)
Potassium: 3.8 mmol/L (ref 3.5–5.1)
SODIUM: 140 mmol/L (ref 135–145)

## 2016-06-01 ENCOUNTER — Encounter (HOSPITAL_COMMUNITY)
Admission: RE | Admit: 2016-06-01 | Discharge: 2016-06-01 | Disposition: A | Discharge status: Home / Self Care | Payer: Medicare Other | Source: Ambulatory Visit | Attending: Cardiology | Admitting: Cardiology

## 2016-06-01 ENCOUNTER — Encounter: Payer: Self-pay | Admitting: Internal Medicine

## 2016-06-01 VITALS — BP 103/64 | HR 64 | Ht 63.0 in | Wt 165.8 lb

## 2016-06-01 DIAGNOSIS — I5022 Chronic systolic (congestive) heart failure: Secondary | ICD-10-CM | POA: Insufficient documentation

## 2016-06-01 NOTE — Progress Notes (Signed)
Cardiac Rehab Medication Review by a Pharmacist  Does the patient  feel that his/her medications are working for him/her?  Unsure since she doesn't feel them working.  Has the patient been experiencing any side effects to the medications prescribed? Yes, lightheadedness with carvedilol.  Does the patient measure his/her own blood pressure or blood glucose at home?  yes   Does the patient have any problems obtaining medications due to transportation or finances?   yes  Understanding of regimen: fair Understanding of indications: poor Potential of compliance: good    Pharmacist comments: Kim Mullins is an 44 [place] who presents to [facility] in good spirits. She explains she was diagnosed with HFrEF and started on new medications in 2016. She does not endorse complete understanding of her medication regimen. We spent a good portion of the session discussing the reasons she is on these medications. She also has a history of diabetes, for which she is not on any medication and only checks her blood sugar daily. I also provided education on blood pressure and blood sugar goals.     Dierdre Harness, Cain Sieve, PharmD Clinical Pharmacy Resident [phone] (Pager) 06/01/2016 2:04 PM

## 2016-06-02 ENCOUNTER — Encounter (HOSPITAL_COMMUNITY): Payer: Self-pay

## 2016-06-02 NOTE — Progress Notes (Signed)
Cardiac Individual Treatment Plan  Patient Details  Name: SHAKIAH WESTER MRN: [id number] Date of Birth: 03-01-1930 Referring Provider:   Flowsheet Row CARDIAC REHAB PHASE II ORIENTATION from 06/01/2016 in [place]  Referring Provider  [name], MD      Initial Encounter Date:  [place] PHASE II ORIENTATION from 06/01/2016 in [place]  Date  06/01/16  Referring Provider  [name], MD      Visit Diagnosis: Chronic systolic congestive heart failure ([place])  Patient's Home Medications on Admission:  Current Outpatient Prescriptions:  .  apixaban (ELIQUIS) 5 MG TABS tablet, Take 1 tablet (5 mg total) by mouth 2 (two) times daily., Disp: 60 tablet, Rfl: 6 .  Ascorbic Acid (VITAMIN C PO), Take 1 tablet by mouth daily. Reported on 12/19/2015, Disp: , Rfl:  .  Bisacodyl (LAXATIVE PO), Take 1 tablet by mouth daily as needed (for constipation). , Disp: , Rfl:  .  CALCIUM PO, Take 1 tablet by mouth daily., Disp: , Rfl:  .  carvedilol (COREG) 3.125 MG tablet, Take 1 tablet (3.125 mg total) by mouth 2 (two) times daily with a meal., Disp: 60 tablet, Rfl: 6 .  diclofenac sodium (VOLTAREN) 1 % GEL, Apply 2 g topically 3 (three) times daily as needed (pain, inflammation)., Disp: 100 g, Rfl: 0 .  dorzolamide (TRUSOPT) 2 % ophthalmic solution, Place 1 drop into both eyes 2 (two) times daily. , Disp: , Rfl: 11 .  furosemide (LASIX) 40 MG tablet, Take 1 tablet (40 mg total) by mouth 2 (two) times daily., Disp: 180 tablet, Rfl: 3 .  lisinopril (PRINIVIL,ZESTRIL) 2.5 MG tablet, Take 1 tablet (2.5 mg total) by mouth every evening., Disp: 30 tablet, Rfl: 3 .  spironolactone (ALDACTONE) 25 MG tablet, Take 0.5 tablets (12.5 mg total) by mouth at bedtime., Disp: 15 tablet, Rfl: 6 .  timolol (TIMOPTIC) 0.5 % ophthalmic solution, Place 1 drop into both eyes daily. , Disp: , Rfl: 11 .  Acetaminophen (TYLENOL) 325 MG  CAPS, Take 325 mg by mouth daily as needed (for shoulder pain). , Disp: , Rfl:  .  diphenhydramine-acetaminophen (TYLENOL PM) 25-500 MG TABS tablet, Take 1 tablet by mouth at bedtime as needed (sleep). , Disp: , Rfl:   Past Medical History: Past Medical History:  Diagnosis Date  . Anemia    occassionally  . Arthritis    all over.  . Asthma    Allergixc reaction to cats.  . Atrial fibrillation ([place])   . Bowel obstruction   . Chronic systolic CHF (congestive heart failure) (HCC)    EF 30-35% by cath, echo 2016 EF 50%  . Complete heart block (HCC)    a. s/p MDT CRTP pacemaker  . DCM (dilated cardiomyopathy) ([place]) 08/16/2014   normal coronary arteries on cath with EF 30-45%.  EF now 50% by echo 11/2014  . Diabetes mellitus 2006   Diet and exercise controlled.  [place] GERD (gastroesophageal reflux disease)    occ  . Hypertension   . Left tibial fracture 2007  . OSA (obstructive sleep apnea) 10/23/2014   Moderate with AHI 21/hr  . Osteoarthritis of right shoulder region 06/26/2013  . Stroke [facility])     Tobacco Use: History  Smoking Status  . Former Smoker  . Packs/day: 1.00  . Years: 45.00  . Types: Cigarettes  . Quit date: 07/24/1978  Smokeless Tobacco  . Never Used    Labs: Recent Review Flowsheet  Data    Labs for ITP Cardiac and Pulmonary Rehab Latest Ref Rng & Units 08/29/2014 06/18/2015 06/19/2015 04/12/2016 04/13/2016   Cholestrol 0 - 200 mg/dL - - 110 - 90   LDLCALC 0 - 99 mg/dL - - 50 - 46   HDL >40 mg/dL - - 49 - 30(L)   Trlycerides <150 mg/dL - - 55 - 69   Hemoglobin A1c 4.8 - 5.6 % - - 5.8(H) - 5.7(H)   PHART 7.350 - 7.450 7.376 - - - -   PCO2ART 35.0 - 45.0 mmHg 40.0 - - - -   HCO3 20.0 - 24.0 mEq/L 23.4 - - - -   TCO2 0 - 100 mmol/L 25 22 - 20 -   ACIDBASEDEF 0.0 - 2.0 mmol/L 2.0 - - - -   O2SAT % 98.0 - - - -      Capillary Blood Glucose: Lab Results  Component Value Date   GLUCAP 102 (H) 04/12/2016   GLUCAP 90 06/20/2015   GLUCAP 132 (H) 06/20/2015    GLUCAP 114 (H) 06/19/2015   GLUCAP 75 06/19/2015     Exercise Target Goals: Date: 06/01/16  Exercise Program Goal: Individual exercise prescription set with THRR, safety & activity barriers. Participant demonstrates ability to understand and report RPE using BORG scale, to self-measure pulse accurately, and to acknowledge the importance of the exercise prescription.  Exercise Prescription Goal: Starting with aerobic activity 30 plus minutes a day, 3 days per week for initial exercise prescription. Provide home exercise prescription and guidelines that participant acknowledges understanding prior to discharge.  Activity Barriers & Risk Stratification:     Activity Barriers & Cardiac Risk Stratification - 06/01/16 1653      Activity Barriers & Cardiac Risk Stratification   Cardiac Risk Stratification Moderate      6 Minute Walk:     6 Minute Walk    Row Name 06/01/16 1655         6 Minute Walk   Phase Initial     Distance 465 feet     Walk Time 6 minutes     # of Rest Breaks 1     MPH 1.49     METS 0.39     RPE 13     VO2 Peak 1.38     Symptoms Yes (comment)     Comments fatigue, rest break at 3:03, restart 5:30     Resting HR 64 bpm     Resting BP 103/64     Max Ex. HR 104 bpm     Max Ex. BP 104/60     2 Minute Post BP 108/60        Initial Exercise Prescription:     Initial Exercise Prescription - 06/02/16 0800      Date of Initial Exercise RX and Referring Provider   Date 06/01/16   Referring Provider [name], MD     Bike   Level 0.5   Minutes 10   METs 2.26     NuStep   Level 1   Minutes 10   METs 2     Track   Laps 5   Minutes 10   METs 1.84     Prescription Details   Frequency (times per week) 3   Duration Progress to 30 minutes of continuous aerobic without signs/symptoms of physical distress     Intensity   THRR 40-80% of Max Heartrate 55-110   Ratings of Perceived Exertion 11-13   Perceived Dyspnea 0-4  Progression    Progression Continue to progress workloads to maintain intensity without signs/symptoms of physical distress.     Resistance Training   Training Prescription Yes   Weight 1lb   Reps 10-12      Perform Capillary Blood Glucose checks as needed.  Exercise Prescription Changes:   Exercise Comments:   Discharge Exercise Prescription (Final Exercise Prescription Changes):   Nutrition:  Target Goals: Understanding of nutrition guidelines, daily intake of sodium 1500mg , cholesterol 200mg , calories 30% from fat and 7% or less from saturated fats, daily to have 5 or more servings of fruits and vegetables.  Biometrics:     Pre Biometrics - 06/02/16 0817      Pre Biometrics   Height 5\' 3"  (1.6 m)   Weight 165 lb 12.6 oz (75.2 kg)   Waist Circumference 36.25 inches   Hip Circumference 44.75 inches   Waist to Hip Ratio 0.81 %   BMI (Calculated) 29.4   Triceps Skinfold 17 mm   % Body Fat 38.9 %   Grip Strength 24 kg   Flexibility 7.5 in   Single Leg Stand 0.59 seconds       Nutrition Therapy Plan and Nutrition Goals:   Nutrition Discharge: Nutrition Scores:   Nutrition Goals Re-Evaluation:   Psychosocial: Target Goals: Acknowledge presence or absence of depression, maximize coping skills, provide positive support system. Participant is able to verbalize types and ability to use techniques and skills needed for reducing stress and depression.  Initial Review & Psychosocial Screening:     Initial Psych Review & Screening - 06/01/16 [place]? Yes     Barriers   Psychosocial barriers to participate in program There are no identifiable barriers or psychosocial needs.     Screening Interventions   Interventions Encouraged to exercise      Quality of Life Scores:     Quality of Life - 06/01/16 1704      Quality of Life Scores   Health/Function Pre 15.61 %   Socioeconomic Pre 25.64 %   Psych/Spiritual Pre 29.14 %    Family Pre 22.5 %   GLOBAL Pre 21.63 %      PHQ-9: Recent Review Flowsheet Data    There is no flowsheet data to display.      Psychosocial Evaluation and Intervention:   Psychosocial Re-Evaluation:   Vocational Rehabilitation: Provide vocational rehab assistance to qualifying candidates.   Vocational Rehab Evaluation & Intervention:     Vocational Rehab - 06/01/16 1655      Initial Vocational Rehab Evaluation & Intervention   Assessment shows need for Vocational Rehabilitation No      Education: Education Goals: Education classes will be provided on a weekly basis, covering required topics. Participant will state understanding/return demonstration of topics presented.  Learning Barriers/Preferences:     Learning Barriers/Preferences - 06/01/16 1654      Learning Barriers/Preferences   Learning Barriers Sight   Learning Preferences Written Material;Verbal Instruction      Education Topics: Count Your Pulse:  -Group instruction provided by verbal instruction, demonstration, patient participation and written materials to support subject.  Instructors address importance of being able to find your pulse and how to count your pulse when at home without a heart monitor.  Patients get hands on experience counting their pulse with staff help and individually.   Heart Attack, Angina, and Risk Factor Modification:  -Group instruction provided by verbal instruction, video, and written materials  to support subject.  Instructors address signs and symptoms of angina and heart attacks.    Also discuss risk factors for heart disease and how to make changes to improve heart health risk factors.   Functional Fitness:  -Group instruction provided by verbal instruction, demonstration, patient participation, and written materials to support subject.  Instructors address safety measures for doing things around the house.  Discuss how to get up and down off the floor, how to pick  things up properly, how to safely get out of a chair without assistance, and balance training.   Meditation and Mindfulness:  -Group instruction provided by verbal instruction, patient participation, and written materials to support subject.  Instructor addresses importance of mindfulness and meditation practice to help reduce stress and improve awareness.  Instructor also leads participants through a meditation exercise.    Stretching for Flexibility and Mobility:  -Group instruction provided by verbal instruction, patient participation, and written materials to support subject.  Instructors lead participants through series of stretches that are designed to increase flexibility thus improving mobility.  These stretches are additional exercise for major muscle groups that are typically performed during regular warm up and cool down.   Hands Only CPR Anytime:  -Group instruction provided by verbal instruction, video, patient participation and written materials to support subject.  Instructors co-teach with AHA video for hands only CPR.  Participants get hands on experience with mannequins.   Nutrition I class: Heart Healthy Eating:  -Group instruction provided by PowerPoint slides, verbal discussion, and written materials to support subject matter. The instructor gives an explanation and review of the Therapeutic Lifestyle Changes diet recommendations, which includes a discussion on lipid goals, dietary fat, sodium, fiber, plant stanol/sterol esters, sugar, and the components of a well-balanced, healthy diet.   Nutrition II class: Lifestyle Skills:  -Group instruction provided by PowerPoint slides, verbal discussion, and written materials to support subject matter. The instructor gives an explanation and review of label reading, grocery shopping for heart health, heart healthy recipe modifications, and ways to make healthier choices when eating out.   Diabetes Question & Answer:  -Group  instruction provided by PowerPoint slides, verbal discussion, and written materials to support subject matter. The instructor gives an explanation and review of diabetes co-morbidities, pre- and post-prandial blood glucose goals, pre-exercise blood glucose goals, signs, symptoms, and treatment of hypoglycemia and hyperglycemia, and foot care basics.   Diabetes Blitz:  -Group instruction provided by PowerPoint slides, verbal discussion, and written materials to support subject matter. The instructor gives an explanation and review of the physiology behind type 1 and type 2 diabetes, diabetes medications and rational behind using different medications, pre- and post-prandial blood glucose recommendations and Hemoglobin A1c goals, diabetes diet, and exercise including blood glucose guidelines for exercising safely.    Portion Distortion:  -Group instruction provided by PowerPoint slides, verbal discussion, written materials, and food models to support subject matter. The instructor gives an explanation of serving size versus portion size, changes in portions sizes over the last 20 years, and what consists of a serving from each food group.   Stress Management:  -Group instruction provided by verbal instruction, video, and written materials to support subject matter.  Instructors review role of stress in heart disease and how to cope with stress positively.     Exercising on Your Own:  -Group instruction provided by verbal instruction, power point, and written materials to support subject.  Instructors discuss benefits of exercise, components of exercise, frequency and intensity of  exercise, and end points for exercise.  Also discuss use of nitroglycerin and activating EMS.  Review options of places to exercise outside of rehab.  Review guidelines for sex with heart disease.   Cardiac Drugs I:  -Group instruction provided by verbal instruction and written materials to support subject.  Instructor  reviews cardiac drug classes: antiplatelets, anticoagulants, beta blockers, and statins.  Instructor discusses reasons, side effects, and lifestyle considerations for each drug class.   Cardiac Drugs II:  -Group instruction provided by verbal instruction and written materials to support subject.  Instructor reviews cardiac drug classes: angiotensin converting enzyme inhibitors (ACE-I), angiotensin II receptor blockers (ARBs), nitrates, and calcium channel blockers.  Instructor discusses reasons, side effects, and lifestyle considerations for each drug class.   Anatomy and Physiology of the Circulatory System:  -Group instruction provided by verbal instruction, video, and written materials to support subject.  Reviews functional anatomy of heart, how it relates to various diagnoses, and what role the heart plays in the overall system.   Knowledge Questionnaire Score:     Knowledge Questionnaire Score - 06/01/16 1655      Knowledge Questionnaire Score   Pre Score 20/24      Core Components/Risk Factors/Patient Goals at Admission:     Personal Goals and Risk Factors at Admission - 06/01/16 1657      Core Components/Risk Factors/Patient Goals on Admission    Weight Management Weight Loss;Yes   Intervention Weight Management: Provide education and appropriate resources to help participant work on and attain dietary goals.;Weight Management/Obesity: Establish reasonable short term and long term weight goals.   Admit Weight 143 lb 8.3 oz (65.1 kg)   Expected Outcomes Short Term: Continue to assess and modify interventions until short term weight is achieved;Long Term: Adherence to nutrition and physical activity/exercise program aimed toward attainment of established weight goal;Weight Loss: Understanding of general recommendations for a balanced deficit meal plan, which promotes 1-2 lb weight loss per week and includes a negative energy balance of [phone] kcal/d   Sedentary Yes    Intervention Provide advice, education, support and counseling about physical activity/exercise needs.;Develop an individualized exercise prescription for aerobic and resistive training based on initial evaluation findings, risk stratification, comorbidities and participant's personal goals.   Expected Outcomes Achievement of increased cardiorespiratory fitness and enhanced flexibility, muscular endurance and strength shown through measurements of functional capacity and personal statement of participant.   Increase Strength and Stamina Yes   Intervention Provide advice, education, support and counseling about physical activity/exercise needs.;Develop an individualized exercise prescription for aerobic and resistive training based on initial evaluation findings, risk stratification, comorbidities and participant's personal goals.   Expected Outcomes Achievement of increased cardiorespiratory fitness and enhanced flexibility, muscular endurance and strength shown through measurements of functional capacity and personal statement of participant.   Diabetes Yes   Intervention Provide education about signs/symptoms and action to take for hypo/hyperglycemia.;Provide education about proper nutrition, including hydration, and aerobic/resistive exercise prescription along with prescribed medications to achieve blood glucose in normal ranges: Fasting glucose 65-99 mg/dL   Expected Outcomes Short Term: Participant verbalizes understanding of the signs/symptoms and immediate care of hyper/hypoglycemia, proper foot care and importance of medication, aerobic/resistive exercise and nutrition plan for blood glucose control.;Long Term: Attainment of HbA1C < 7%.   Heart Failure Yes   Intervention Provide a combined exercise and nutrition program that is supplemented with education, support and counseling about heart failure. Directed toward relieving symptoms such as shortness of breath, decreased exercise tolerance, and  extremity edema.   Expected Outcomes Improve functional capacity of life;Short term: Attendance in program 2-3 days a week with increased exercise capacity. Reported lower sodium intake. Reported increased fruit and vegetable intake. Reports medication compliance.;Long term: Adoption of self-care skills and reduction of barriers for early signs and symptoms recognition and intervention leading to self-care maintenance.;Short term: Daily weights obtained and reported for increase. Utilizing diuretic protocols set by physician.   Hypertension Yes   Intervention Provide education on lifestyle modifcations including regular physical activity/exercise, weight management, moderate sodium restriction and increased consumption of fresh fruit, vegetables, and low fat dairy, alcohol moderation, and smoking cessation.;Monitor prescription use compliance.   Expected Outcomes Short Term: Continued assessment and intervention until BP is < 140/44mm HG in hypertensive participants. < 130/50mm HG in hypertensive participants with diabetes, heart failure or chronic kidney disease.;Long Term: Maintenance of blood pressure at goal levels.   Personal Goal Other Yes   Personal Goal Short term:  be able to do more activities without dyspnea,  long term:  have better balance   Intervention participate in regular aerobic exercise as prescribed including strength and flexibility training   Expected Outcomes improved physical fitness level with increased balance as indicated by decreased exertional symptoms and functional capactity measurements.       Core Components/Risk Factors/Patient Goals Review:    Core Components/Risk Factors/Patient Goals at Discharge (Final Review):    ITP Comments:     ITP Comments    Row Name 06/01/16 1651           ITP Comments Medical Director:  Dr. Fransico Him           Comments: Patient attended orientation from 1330 to 1600 to review rules and guidelines for program. Completed  6 minute walk test, Intitial ITP, and exercise prescription.  VSS. Telemetry-AV Paced. Ms. Speas walked about 2 laps on the track and had to sit down to rest for a minute due to leg fatigue. Symptoms resolved after rest. Ms Timmons is somewhat deconditioned and used a rollater for assistance with ambulation.Will continue to monitor the patient throughout  the program.Evangeline Utley Venetia Maxon, RN,BSN 06/02/2016 12:25 PM

## 2016-06-07 ENCOUNTER — Encounter (HOSPITAL_COMMUNITY)
Admission: RE | Admit: 2016-06-07 | Discharge: 2016-06-07 | Disposition: A | Discharge status: Home / Self Care | Payer: Medicare Other | Source: Ambulatory Visit | Attending: Cardiology | Admitting: Cardiology

## 2016-06-07 DIAGNOSIS — I5022 Chronic systolic (congestive) heart failure: Secondary | ICD-10-CM | POA: Diagnosis not present

## 2016-06-07 LAB — GLUCOSE, CAPILLARY
GLUCOSE-CAPILLARY: 127 mg/dL — AB (ref 65–99)
Glucose-Capillary: 114 mg/dL — ABNORMAL HIGH (ref 65–99)

## 2016-06-07 NOTE — Progress Notes (Signed)
Daily Session Note  Patient Details  Name: Kim Mullins MRN: [id number] Date of Birth: Aug 18, 1930 Referring Provider:   Flowsheet Row CARDIAC REHAB PHASE II ORIENTATION from 06/01/2016 in [place]  Referring Provider  [name], MD      Encounter Date: 06/07/2016  Check In:     Session Check In - 06/07/16 1215      Check-In   Location [facility]   Staff Present Maurice Small, RN, BSN;Molly diVincenzo, MS, ACSM RCEP, Exercise Physiologist;Amber Fair, MS, ACSM RCEP, Exercise Physiologist;Olinty Celesta Aver, MS, ACSM CEP, Exercise Physiologist;Joann Rion, RN, BSN   Supervising physician immediately available to respond to emergencies [facility] immediately available   Physician(s) Dr. Waldron Labs   Medication changes reported     No   Fall or balance concerns reported    No   Warm-up and Cool-down Performed as group-led instruction   Resistance Training Performed Yes   VAD Patient? No     Pain Assessment   Currently in Pain? No/denies   Multiple Pain Sites No      Capillary Blood Glucose: Results for orders placed or performed during the hospital encounter of 06/07/16 (from the past 24 hour(s))  Glucose, capillary     Status: Abnormal   Collection Time: 06/07/16 12:20 PM  Result Value Ref Range   Glucose-Capillary 127 (H) 65 - 99 mg/dL     Goals Met:  Exercise tolerated well Personal goals reviewed  Goals Unmet:  Not Applicable  Comments: Pt started full exercise at  cardiac rehab today.  Pt tolerated light exercise without difficulty. VSS, telemetry-Paced, asymptomatic.  Medication list reconciled. Pt denies barriers to medicaiton compliance.  PSYCHOSOCIAL ASSESSMENT:  PHQ-0. Pt exhibits positive coping skills, hopeful outlook with supportive family. No psychosocial needs identified at this time, no psychosocial interventions necessary.    Pt enjoys playing cards twice a week. Pt plans to attend  cardiac rehab twice a week due to copay.   Pt oriented to exercise equipment and routine.    Understanding verbalized. Maurice Small RN, BSN    Dr. Fransico Him is Medical Director for Cardiac Rehab at [facility].

## 2016-06-08 DIAGNOSIS — Y9342 Activity, yoga: Secondary | ICD-10-CM

## 2016-06-08 NOTE — Congregational Nurse Program (Signed)
Congregational Nurse Program Note  Date of Encounter: 06/08/2016  Past Medical History: No past medical history on file.  Encounter Details:     CNP Questionnaire - 06/08/16 1114      Patient Demographics   Is this a new or existing patient? Existing   Patient is considered a/an Not Applicable   Race African-American/Black     Patient Assistance   Location of Patient Assistance Not Applicable   Patient's financial/insurance status Medicare   Uninsured Patient No   Patient referred to apply for the following financial assistance Not Applicable   Food insecurities addressed Not Applicable   Transportation assistance No   Assistance securing medications No   Educational health offerings Exercise/physical activity     Encounter Details   Primary purpose of visit Spiritual Care/Support Visit   Was an Emergency Department visit averted? Not Applicable   Does patient have a medical provider? Yes   Patient referred to Not Applicable   Was a mental health screening completed? (GAINS tool) No   Does patient have dental issues? No   Does patient have vision issues? No   Does your patient have an abnormal blood pressure today? No   Since previous encounter, have you referred patient for abnormal blood pressure that resulted in a new diagnosis or medication change? No   Does your patient have an abnormal blood glucose today? No   Since previous encounter, have you referred patient for abnormal blood glucose that resulted in a new diagnosis or medication change? No   Was there a life-saving intervention made? No    Attended chair yoga class.

## 2016-06-09 ENCOUNTER — Encounter (HOSPITAL_COMMUNITY)
Admission: RE | Admit: 2016-06-09 | Discharge: 2016-06-09 | Disposition: A | Discharge status: Home / Self Care | Payer: Medicare Other | Source: Ambulatory Visit | Attending: Cardiology | Admitting: Cardiology

## 2016-06-09 DIAGNOSIS — I5022 Chronic systolic (congestive) heart failure: Secondary | ICD-10-CM

## 2016-06-09 LAB — GLUCOSE, CAPILLARY: Glucose-Capillary: 87 mg/dL (ref 65–99)

## 2016-06-09 NOTE — Progress Notes (Signed)
Incomplete Session Note  Patient Details  Name: RIYAN GAVINA MRN: [id number] Date of Birth: 10/20/1929 Referring Provider:   Flowsheet Row CARDIAC REHAB PHASE II ORIENTATION from 06/01/2016 in [place]  Referring Provider  [name], MD      [name] did not complete her rehab session due to low blood glucose pre exercise.  Pt returned today and attended education class. During the education class pt fell asleep.  Pt pre exercise blood glucose was 96.  Pt ate cheese toast and cup of coffee. Pt given banana and lemonade to drink.  Rechecked after 15 minutes pt 87.  Pt given gingerale and peanut butter and crackers.  Pt remained asymptomatic. Advised pt to increase dietary intake on rehab mornings. Pt verbalized understanding.  Pt to eat lunch upon returning home and recheck her blood glucose.  Cherre Huger, BSN

## 2016-06-10 LAB — GLUCOSE, CAPILLARY: GLUCOSE-CAPILLARY: 96 mg/dL (ref 65–99)

## 2016-06-10 NOTE — Progress Notes (Signed)
Cardiac Individual Treatment Plan  Patient Details  Name: Kim Mullins MRN: [id number] Date of Birth: [birth year] Referring Provider:   Flowsheet Row CARDIAC REHAB PHASE II ORIENTATION from 06/01/2016 in [place]  Referring Provider  [name], MD      Initial Encounter Date:  [place] PHASE II ORIENTATION from 06/01/2016 in [place]  Date  06/01/16  Referring Provider  [name], MD      Visit Diagnosis: Chronic systolic congestive heart failure [facility])  Patient's Home Medications on Admission:  Current Outpatient Prescriptions:  .  Acetaminophen (TYLENOL) 325 MG CAPS, Take 325 mg by mouth daily as needed (for shoulder pain). , Disp: , Rfl:  .  apixaban (ELIQUIS) 5 MG TABS tablet, Take 1 tablet (5 mg total) by mouth 2 (two) times daily., Disp: 60 tablet, Rfl: 6 .  Ascorbic Acid (VITAMIN C PO), Take 1 tablet by mouth daily. Reported on 12/19/2015, Disp: , Rfl:  .  Bisacodyl (LAXATIVE PO), Take 1 tablet by mouth daily as needed (for constipation). , Disp: , Rfl:  .  CALCIUM PO, Take 1 tablet by mouth daily., Disp: , Rfl:  .  carvedilol (COREG) 3.125 MG tablet, Take 1 tablet (3.125 mg total) by mouth 2 (two) times daily with a meal., Disp: 60 tablet, Rfl: 6 .  diclofenac sodium (VOLTAREN) 1 % GEL, Apply 2 g topically 3 (three) times daily as needed (pain, inflammation)., Disp: 100 g, Rfl: 0 .  diphenhydramine-acetaminophen (TYLENOL PM) 25-500 MG TABS tablet, Take 1 tablet by mouth at bedtime as needed (sleep). , Disp: , Rfl:  .  dorzolamide (TRUSOPT) 2 % ophthalmic solution, Place 1 drop into both eyes 2 (two) times daily. , Disp: , Rfl: 11 .  furosemide (LASIX) 40 MG tablet, Take 1 tablet (40 mg total) by mouth 2 (two) times daily., Disp: 180 tablet, Rfl: 3 .  lisinopril (PRINIVIL,ZESTRIL) 2.5 MG tablet, Take 1 tablet (2.5 mg total) by mouth every evening., Disp: 30 tablet, Rfl: 3 .   spironolactone (ALDACTONE) 25 MG tablet, Take 0.5 tablets (12.5 mg total) by mouth at bedtime., Disp: 15 tablet, Rfl: 6 .  timolol (TIMOPTIC) 0.5 % ophthalmic solution, Place 1 drop into both eyes daily. , Disp: , Rfl: 11  Past Medical History: Past Medical History:  Diagnosis Date  . Anemia    occassionally  . Arthritis    all over.  . Asthma    Allergixc reaction to cats.  . Atrial fibrillation ([facility])   . Bowel obstruction   . Chronic systolic CHF (congestive heart failure) (HCC)    EF 30-35% by cath, echo 2016 EF 50%  . Complete heart block (HCC)    a. s/p MDT CRTP pacemaker  . DCM (dilated cardiomyopathy) ([place]) 08/16/2014   normal coronary arteries on cath with EF 30-45%.  EF now 50% by echo 11/2014  . Diabetes mellitus 2006   Diet and exercise controlled.  [place] GERD (gastroesophageal reflux disease)    occ  . Hypertension   . Left tibial fracture 2007  . OSA (obstructive sleep apnea) 10/23/2014   Moderate with AHI 21/hr  . Osteoarthritis of right shoulder region 06/26/2013  . Stroke [facility])     Tobacco Use: History  Smoking Status  . Former Smoker  . Packs/day: 1.00  . Years: 45.00  . Types: Cigarettes  . Quit date: 07/24/1978  Smokeless Tobacco  . Never Used    Labs: Recent Review Flowsheet  Data    Labs for ITP Cardiac and Pulmonary Rehab Latest Ref Rng & Units 08/29/2014 06/18/2015 06/19/2015 04/12/2016 04/13/2016   Cholestrol 0 - 200 mg/dL - - 110 - 90   LDLCALC 0 - 99 mg/dL - - 50 - 46   HDL >40 mg/dL - - 49 - 30(L)   Trlycerides <150 mg/dL - - 55 - 69   Hemoglobin A1c 4.8 - 5.6 % - - 5.8(H) - 5.7(H)   PHART 7.350 - 7.450 7.376 - - - -   PCO2ART 35.0 - 45.0 mmHg 40.0 - - - -   HCO3 20.0 - 24.0 mEq/L 23.4 - - - -   TCO2 0 - 100 mmol/L 25 22 - 20 -   ACIDBASEDEF 0.0 - 2.0 mmol/L 2.0 - - - -   O2SAT % 98.0 - - - -      Capillary Blood Glucose: Lab Results  Component Value Date   GLUCAP 87 06/09/2016   GLUCAP 96 06/09/2016   GLUCAP 127 (H) 06/07/2016    GLUCAP 114 (H) 06/07/2016   GLUCAP 102 (H) 04/12/2016     Exercise Target Goals:    Exercise Program Goal: Individual exercise prescription set with THRR, safety & activity barriers. Participant demonstrates ability to understand and report RPE using BORG scale, to self-measure pulse accurately, and to acknowledge the importance of the exercise prescription.  Exercise Prescription Goal: Starting with aerobic activity 30 plus minutes a day, 3 days per week for initial exercise prescription. Provide home exercise prescription and guidelines that participant acknowledges understanding prior to discharge.  Activity Barriers & Risk Stratification:     Activity Barriers & Cardiac Risk Stratification - 06/01/16 1653      Activity Barriers & Cardiac Risk Stratification   Cardiac Risk Stratification Moderate      6 Minute Walk:     6 Minute Walk    Row Name 06/01/16 1655         6 Minute Walk   Phase Initial     Distance 465 feet     Walk Time 6 minutes     # of Rest Breaks 1     MPH 1.49     METS 0.39     RPE 13     VO2 Peak 1.38     Symptoms Yes (comment)     Comments fatigue, rest break at 3:03, restart 5:30     Resting HR 64 bpm     Resting BP 103/64     Max Ex. HR 104 bpm     Max Ex. BP 104/60     2 Minute Post BP 108/60        Initial Exercise Prescription:     Initial Exercise Prescription - 06/02/16 0800      Date of Initial Exercise RX and Referring Provider   Date 06/01/16   Referring Provider [name], MD     Bike   Level 0.5   Minutes 10   METs 2.26     NuStep   Level 1   Minutes 10   METs 2     Track   Laps 5   Minutes 10   METs 1.84     Prescription Details   Frequency (times per week) 3   Duration Progress to 30 minutes of continuous aerobic without signs/symptoms of physical distress     Intensity   THRR 40-80% of Max Heartrate 55-110   Ratings of Perceived Exertion 11-13   Perceived Dyspnea 0-4  Progression    Progression Continue to progress workloads to maintain intensity without signs/symptoms of physical distress.     Resistance Training   Training Prescription Yes   Weight 1lb   Reps 10-12      Perform Capillary Blood Glucose checks as needed.  Exercise Prescription Changes:     Exercise Prescription Changes    Row Name 06/09/16 1600             Response to Exercise   Blood Pressure (Admit) 111/66       Blood Pressure (Exercise) 106/60       Blood Pressure (Exit) 104/60       Heart Rate (Admit) 83 bpm       Heart Rate (Exercise) 103 bpm       Heart Rate (Exit) 71 bpm       Rating of Perceived Exertion (Exercise) 12         Progression   Average METs 2.21         Resistance Training   Training Prescription Yes       Weight 1lb       Reps 10-12         Treadmill   MPH 2       Grade 0       Minutes 10       METs 2.57         Bike   Level -       Minutes -       METs -         NuStep   Level 1       Minutes 10       METs 1.8         Arm Ergometer   Level 1       Minutes 10       METs 2.25         Track   Laps -       Minutes -       METs -          Exercise Comments:     Exercise Comments    Row Name 06/09/16 1639           Exercise Comments Pt is off to a good start with exercise prescription          Discharge Exercise Prescription (Final Exercise Prescription Changes):     Exercise Prescription Changes - 06/09/16 1600      Response to Exercise   Blood Pressure (Admit) 111/66   Blood Pressure (Exercise) 106/60   Blood Pressure (Exit) 104/60   Heart Rate (Admit) 83 bpm   Heart Rate (Exercise) 103 bpm   Heart Rate (Exit) 71 bpm   Rating of Perceived Exertion (Exercise) 12     Progression   Average METs 2.21     Resistance Training   Training Prescription Yes   Weight 1lb   Reps 10-12     Treadmill   MPH 2   Grade 0   Minutes 10   METs 2.57     Bike   Level --   Minutes --   METs --     NuStep   Level 1    Minutes 10   METs 1.8     Arm Ergometer   Level 1   Minutes 10   METs 2.25     Track   Laps --   Minutes --   METs --  Nutrition:  Target Goals: Understanding of nutrition guidelines, daily intake of sodium 1500mg , cholesterol 200mg , calories 30% from fat and 7% or less from saturated fats, daily to have 5 or more servings of fruits and vegetables.  Biometrics:     Pre Biometrics - 06/02/16 0817      Pre Biometrics   Height 5\' 3"  (1.6 m)   Weight 165 lb 12.6 oz (75.2 kg)   Waist Circumference 36.25 inches   Hip Circumference 44.75 inches   Waist to Hip Ratio 0.81 %   BMI (Calculated) 29.4   Triceps Skinfold 17 mm   % Body Fat 38.9 %   Grip Strength 24 kg   Flexibility 7.5 in   Single Leg Stand 0.59 seconds       Nutrition Therapy Plan and Nutrition Goals:   Nutrition Discharge: Nutrition Scores:   Nutrition Goals Re-Evaluation:   Psychosocial: Target Goals: Acknowledge presence or absence of depression, maximize coping skills, provide positive support system. Participant is able to verbalize types and ability to use techniques and skills needed for reducing stress and depression.  Initial Review & Psychosocial Screening:     Initial Psych Review & Screening - 06/01/16 [place]? Yes     Barriers   Psychosocial barriers to participate in program There are no identifiable barriers or psychosocial needs.     Screening Interventions   Interventions Encouraged to exercise      Quality of Life Scores:     Quality of Life - 06/01/16 1704      Quality of Life Scores   Health/Function Pre 15.61 %   Socioeconomic Pre 25.64 %   Psych/Spiritual Pre 29.14 %   Family Pre 22.5 %   GLOBAL Pre 21.63 %      PHQ-9: Recent Review Flowsheet Data    There is no flowsheet data to display.      Psychosocial Evaluation and Intervention:     Psychosocial Evaluation - 06/10/16 1616      Psychosocial  Evaluation & Interventions   Interventions Encouraged to exercise with the program and follow exercise prescription      Psychosocial Re-Evaluation:   Vocational Rehabilitation: Provide vocational rehab assistance to qualifying candidates.   Vocational Rehab Evaluation & Intervention:     Vocational Rehab - 06/01/16 1655      Initial Vocational Rehab Evaluation & Intervention   Assessment shows need for Vocational Rehabilitation No      Education: Education Goals: Education classes will be provided on a weekly basis, covering required topics. Participant will state understanding/return demonstration of topics presented.  Learning Barriers/Preferences:     Learning Barriers/Preferences - 06/01/16 1654      Learning Barriers/Preferences   Learning Barriers Sight   Learning Preferences Written Material;Verbal Instruction      Education Topics: Count Your Pulse:  -Group instruction provided by verbal instruction, demonstration, patient participation and written materials to support subject.  Instructors address importance of being able to find your pulse and how to count your pulse when at home without a heart monitor.  Patients get hands on experience counting their pulse with staff help and individually.   Heart Attack, Angina, and Risk Factor Modification:  -Group instruction provided by verbal instruction, video, and written materials to support subject.  Instructors address signs and symptoms of angina and heart attacks.    Also discuss risk factors for heart disease and how to make changes to improve heart health  risk factors.   Functional Fitness:  -Group instruction provided by verbal instruction, demonstration, patient participation, and written materials to support subject.  Instructors address safety measures for doing things around the house.  Discuss how to get up and down off the floor, how to pick things up properly, how to safely get out of a chair without  assistance, and balance training.   Meditation and Mindfulness:  -Group instruction provided by verbal instruction, patient participation, and written materials to support subject.  Instructor addresses importance of mindfulness and meditation practice to help reduce stress and improve awareness.  Instructor also leads participants through a meditation exercise.    Stretching for Flexibility and Mobility:  -Group instruction provided by verbal instruction, patient participation, and written materials to support subject.  Instructors lead participants through series of stretches that are designed to increase flexibility thus improving mobility.  These stretches are additional exercise for major muscle groups that are typically performed during regular warm up and cool down.   Hands Only CPR Anytime:  -Group instruction provided by verbal instruction, video, patient participation and written materials to support subject.  Instructors co-teach with AHA video for hands only CPR.  Participants get hands on experience with mannequins.   Nutrition I class: Heart Healthy Eating:  -Group instruction provided by PowerPoint slides, verbal discussion, and written materials to support subject matter. The instructor gives an explanation and review of the Therapeutic Lifestyle Changes diet recommendations, which includes a discussion on lipid goals, dietary fat, sodium, fiber, plant stanol/sterol esters, sugar, and the components of a well-balanced, healthy diet.   Nutrition II class: Lifestyle Skills:  -Group instruction provided by PowerPoint slides, verbal discussion, and written materials to support subject matter. The instructor gives an explanation and review of label reading, grocery shopping for heart health, heart healthy recipe modifications, and ways to make healthier choices when eating out.   Diabetes Question & Answer:  -Group instruction provided by PowerPoint slides, verbal discussion, and  written materials to support subject matter. The instructor gives an explanation and review of diabetes co-morbidities, pre- and post-prandial blood glucose goals, pre-exercise blood glucose goals, signs, symptoms, and treatment of hypoglycemia and hyperglycemia, and foot care basics.   Diabetes Blitz:  -Group instruction provided by PowerPoint slides, verbal discussion, and written materials to support subject matter. The instructor gives an explanation and review of the physiology behind type 1 and type 2 diabetes, diabetes medications and rational behind using different medications, pre- and post-prandial blood glucose recommendations and Hemoglobin A1c goals, diabetes diet, and exercise including blood glucose guidelines for exercising safely.    Portion Distortion:  -Group instruction provided by PowerPoint slides, verbal discussion, written materials, and food models to support subject matter. The instructor gives an explanation of serving size versus portion size, changes in portions sizes over the last 20 years, and what consists of a serving from each food group.   Stress Management:  -Group instruction provided by verbal instruction, video, and written materials to support subject matter.  Instructors review role of stress in heart disease and how to cope with stress positively.     Exercising on Your Own:  -Group instruction provided by verbal instruction, power point, and written materials to support subject.  Instructors discuss benefits of exercise, components of exercise, frequency and intensity of exercise, and end points for exercise.  Also discuss use of nitroglycerin and activating EMS.  Review options of places to exercise outside of rehab.  Review guidelines for sex with heart disease.  Cardiac Drugs I:  -Group instruction provided by verbal instruction and written materials to support subject.  Instructor reviews cardiac drug classes: antiplatelets, anticoagulants, beta  blockers, and statins.  Instructor discusses reasons, side effects, and lifestyle considerations for each drug class.   Cardiac Drugs II:  -Group instruction provided by verbal instruction and written materials to support subject.  Instructor reviews cardiac drug classes: angiotensin converting enzyme inhibitors (ACE-I), angiotensin II receptor blockers (ARBs), nitrates, and calcium channel blockers.  Instructor discusses reasons, side effects, and lifestyle considerations for each drug class.   Anatomy and Physiology of the Circulatory System:  -Group instruction provided by verbal instruction, video, and written materials to support subject.  Reviews functional anatomy of heart, how it relates to various diagnoses, and what role the heart plays in the overall system.   Knowledge Questionnaire Score:     Knowledge Questionnaire Score - 06/01/16 1655      Knowledge Questionnaire Score   Pre Score 20/24      Core Components/Risk Factors/Patient Goals at Admission:     Personal Goals and Risk Factors at Admission - 06/01/16 1657      Core Components/Risk Factors/Patient Goals on Admission    Weight Management Weight Loss;Yes   Intervention Weight Management: Provide education and appropriate resources to help participant work on and attain dietary goals.;Weight Management/Obesity: Establish reasonable short term and long term weight goals.   Admit Weight 143 lb 8.3 oz (65.1 kg)   Expected Outcomes Short Term: Continue to assess and modify interventions until short term weight is achieved;Long Term: Adherence to nutrition and physical activity/exercise program aimed toward attainment of established weight goal;Weight Loss: Understanding of general recommendations for a balanced deficit meal plan, which promotes 1-2 lb weight loss per week and includes a negative energy balance of [phone] kcal/d   Sedentary Yes   Intervention Provide advice, education, support and counseling about  physical activity/exercise needs.;Develop an individualized exercise prescription for aerobic and resistive training based on initial evaluation findings, risk stratification, comorbidities and participant's personal goals.   Expected Outcomes Achievement of increased cardiorespiratory fitness and enhanced flexibility, muscular endurance and strength shown through measurements of functional capacity and personal statement of participant.   Increase Strength and Stamina Yes   Intervention Provide advice, education, support and counseling about physical activity/exercise needs.;Develop an individualized exercise prescription for aerobic and resistive training based on initial evaluation findings, risk stratification, comorbidities and participant's personal goals.   Expected Outcomes Achievement of increased cardiorespiratory fitness and enhanced flexibility, muscular endurance and strength shown through measurements of functional capacity and personal statement of participant.   Diabetes Yes   Intervention Provide education about signs/symptoms and action to take for hypo/hyperglycemia.;Provide education about proper nutrition, including hydration, and aerobic/resistive exercise prescription along with prescribed medications to achieve blood glucose in normal ranges: Fasting glucose 65-99 mg/dL   Expected Outcomes Short Term: Participant verbalizes understanding of the signs/symptoms and immediate care of hyper/hypoglycemia, proper foot care and importance of medication, aerobic/resistive exercise and nutrition plan for blood glucose control.;Long Term: Attainment of HbA1C < 7%.   Heart Failure Yes   Intervention Provide a combined exercise and nutrition program that is supplemented with education, support and counseling about heart failure. Directed toward relieving symptoms such as shortness of breath, decreased exercise tolerance, and extremity edema.   Expected Outcomes Improve functional capacity of  life;Short term: Attendance in program 2-3 days a week with increased exercise capacity. Reported lower sodium intake. Reported increased fruit and vegetable intake. Reports medication  compliance.;Long term: Adoption of self-care skills and reduction of barriers for early signs and symptoms recognition and intervention leading to self-care maintenance.;Short term: Daily weights obtained and reported for increase. Utilizing diuretic protocols set by physician.   Hypertension Yes   Intervention Provide education on lifestyle modifcations including regular physical activity/exercise, weight management, moderate sodium restriction and increased consumption of fresh fruit, vegetables, and low fat dairy, alcohol moderation, and smoking cessation.;Monitor prescription use compliance.   Expected Outcomes Short Term: Continued assessment and intervention until BP is < 140/59mm HG in hypertensive participants. < 130/2mm HG in hypertensive participants with diabetes, heart failure or chronic kidney disease.;Long Term: Maintenance of blood pressure at goal levels.   Personal Goal Other Yes   Personal Goal Short term:  be able to do more activities without dyspnea,  long term:  have better balance   Intervention participate in regular aerobic exercise as prescribed including strength and flexibility training   Expected Outcomes improved physical fitness level with increased balance as indicated by decreased exertional symptoms and functional capactity measurements.       Core Components/Risk Factors/Patient Goals Review:      Goals and Risk Factor Review    Row Name 06/09/16 1640 06/09/16 1645           Core Components/Risk Factors/Patient Goals Review   Personal Goals Review Other  -      Review  - Currently not exercising at home, will review home exercise program with pt next week      Expected Outcomes  - To achieve exercise and weight loss goals         Core Components/Risk Factors/Patient Goals  at Discharge (Final Review):      Goals and Risk Factor Review - 06/09/16 1645      Core Components/Risk Factors/Patient Goals Review   Review Currently not exercising at home, will review home exercise program with pt next week   Expected Outcomes To achieve exercise and weight loss goals      ITP Comments:     ITP Comments    Row Name 06/01/16 1651           ITP Comments Medical Director:  Dr. Fransico Him           Comments:  Pt is making expected progress toward personal goals after completing 2 sessions and one incomplete session due to hypoglycemia.  Pt is off to a good start.  Recommended pt attend diabetes education nutrition class on Tuesday. Pt lives alone and her closest family member lives in [place]. Recommend continued exercise and life style modification education including  stress management and relaxation techniques to decrease cardiac risk profile. Cherre Huger, BSN

## 2016-06-11 ENCOUNTER — Encounter (HOSPITAL_COMMUNITY)
Admission: RE | Admit: 2016-06-11 | Discharge: 2016-06-11 | Disposition: A | Discharge status: Home / Self Care | Payer: Medicare Other | Source: Ambulatory Visit | Attending: Cardiology | Admitting: Cardiology

## 2016-06-11 DIAGNOSIS — I5022 Chronic systolic (congestive) heart failure: Secondary | ICD-10-CM | POA: Diagnosis not present

## 2016-06-11 LAB — GLUCOSE, CAPILLARY: Glucose-Capillary: 96 mg/dL (ref 65–99)

## 2016-06-12 ENCOUNTER — Encounter (HOSPITAL_COMMUNITY): Payer: Self-pay | Admitting: Emergency Medicine

## 2016-06-12 ENCOUNTER — Emergency Department (HOSPITAL_COMMUNITY)
Admission: EM | Admit: 2016-06-12 | Discharge: 2016-06-12 | Disposition: A | Discharge status: Home / Self Care | Payer: Medicare Other | Attending: Emergency Medicine | Admitting: Emergency Medicine

## 2016-06-12 ENCOUNTER — Emergency Department (HOSPITAL_COMMUNITY): Payer: Medicare Other

## 2016-06-12 DIAGNOSIS — Z8673 Personal history of transient ischemic attack (TIA), and cerebral infarction without residual deficits: Secondary | ICD-10-CM | POA: Diagnosis not present

## 2016-06-12 DIAGNOSIS — Y999 Unspecified external cause status: Secondary | ICD-10-CM | POA: Diagnosis not present

## 2016-06-12 DIAGNOSIS — J45909 Unspecified asthma, uncomplicated: Secondary | ICD-10-CM | POA: Diagnosis not present

## 2016-06-12 DIAGNOSIS — E119 Type 2 diabetes mellitus without complications: Secondary | ICD-10-CM | POA: Insufficient documentation

## 2016-06-12 DIAGNOSIS — Z96611 Presence of right artificial shoulder joint: Secondary | ICD-10-CM | POA: Insufficient documentation

## 2016-06-12 DIAGNOSIS — S161XXA Strain of muscle, fascia and tendon at neck level, initial encounter: Secondary | ICD-10-CM

## 2016-06-12 DIAGNOSIS — Y939 Activity, unspecified: Secondary | ICD-10-CM | POA: Diagnosis not present

## 2016-06-12 DIAGNOSIS — I5022 Chronic systolic (congestive) heart failure: Secondary | ICD-10-CM | POA: Diagnosis not present

## 2016-06-12 DIAGNOSIS — M545 Low back pain: Secondary | ICD-10-CM | POA: Diagnosis not present

## 2016-06-12 DIAGNOSIS — I110 Hypertensive heart disease with heart failure: Secondary | ICD-10-CM | POA: Insufficient documentation

## 2016-06-12 DIAGNOSIS — Z87891 Personal history of nicotine dependence: Secondary | ICD-10-CM | POA: Diagnosis not present

## 2016-06-12 DIAGNOSIS — Z7901 Long term (current) use of anticoagulants: Secondary | ICD-10-CM | POA: Insufficient documentation

## 2016-06-12 DIAGNOSIS — Z96641 Presence of right artificial hip joint: Secondary | ICD-10-CM | POA: Diagnosis not present

## 2016-06-12 DIAGNOSIS — Y92481 Parking lot as the place of occurrence of the external cause: Secondary | ICD-10-CM | POA: Diagnosis not present

## 2016-06-12 DIAGNOSIS — Z950 Presence of cardiac pacemaker: Secondary | ICD-10-CM | POA: Diagnosis not present

## 2016-06-12 DIAGNOSIS — S199XXA Unspecified injury of neck, initial encounter: Secondary | ICD-10-CM | POA: Diagnosis present

## 2016-06-12 MED ORDER — ACETAMINOPHEN 500 MG PO TABS
500.0000 mg | ORAL_TABLET | Freq: Once | ORAL | Status: AC
  Administered 2016-06-12: 500 mg via ORAL
  Filled 2016-06-12: qty 1

## 2016-06-12 NOTE — ED Notes (Signed)
Pt to xray
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
## 2016-06-14 ENCOUNTER — Encounter (HOSPITAL_COMMUNITY)
Admission: RE | Admit: 2016-06-14 | Discharge: 2016-06-14 | Disposition: A | Discharge status: Home / Self Care | Payer: Medicare Other | Source: Ambulatory Visit | Attending: Cardiology | Admitting: Cardiology

## 2016-06-14 DIAGNOSIS — I5022 Chronic systolic (congestive) heart failure: Secondary | ICD-10-CM

## 2016-06-14 NOTE — Progress Notes (Signed)
Reviewed home exercise guidelines with patient including endpoints, temperature precautions, target heart rate and rate of perceived exertion. Pt plans to walk 10 minutes 3x's/day, 1-2 days per week as her mode of home exercise. Pt voices understanding of instructions given. Sol Passer, MS, ACSM CCEP

## 2016-06-16 ENCOUNTER — Encounter (HOSPITAL_COMMUNITY)
Admission: RE | Admit: 2016-06-16 | Discharge: 2016-06-16 | Disposition: A | Discharge status: Home / Self Care | Payer: Medicare Other | Source: Ambulatory Visit | Attending: Cardiology | Admitting: Cardiology

## 2016-06-16 DIAGNOSIS — I5022 Chronic systolic (congestive) heart failure: Secondary | ICD-10-CM

## 2016-06-18 ENCOUNTER — Encounter (HOSPITAL_COMMUNITY)
Admission: RE | Admit: 2016-06-18 | Discharge: 2016-06-18 | Disposition: A | Discharge status: Home / Self Care | Payer: Medicare Other | Source: Ambulatory Visit | Attending: Cardiology | Admitting: Cardiology

## 2016-06-18 DIAGNOSIS — I5022 Chronic systolic (congestive) heart failure: Secondary | ICD-10-CM

## 2016-06-18 NOTE — Progress Notes (Signed)
Psychosocial Assessment QUALITY OF LIFE SCORE REVIEW  Pt completed Quality of Life survey as a participant in Cardiac Rehab. Scores 21.0 or below are considered low. Pt scored the following     Quality of Life - 06/01/16 1704      Quality of Life Scores   Health/Function Pre 15.61 %   Socioeconomic Pre 25.64 %   Psych/Spiritual Pre 29.14 %   Family Pre 22.5 %   GLOBAL Pre 21.63 %     Patient quality of life slightly altered by physical constraints which limits ability to perform as prior to recent cardiac illness.  Pt has had a slow decline with her health as a result of the aging process. As a result of the decline in health pt lacks the energy to care for herself without assistance.  Prior to this pt was able to do activities of daily living independently.Pt has extended family but they live in [place].  Due to the distance of travel they are not immediately available.  Pt realizes this but misses the close contact of having family in the area.Liana Gerold emotional support and reassurance.  Will continue to monitor with periodic check inns and intervene as necessary. Cherre Huger, BSN

## 2016-06-21 ENCOUNTER — Encounter (HOSPITAL_COMMUNITY): Payer: Medicare Other

## 2016-06-21 ENCOUNTER — Encounter (HOSPITAL_COMMUNITY)
Admission: RE | Admit: 2016-06-21 | Discharge: 2016-06-21 | Disposition: A | Discharge status: Home / Self Care | Payer: Medicare Other | Source: Ambulatory Visit | Attending: Cardiology | Admitting: Cardiology

## 2016-06-21 DIAGNOSIS — I5022 Chronic systolic (congestive) heart failure: Secondary | ICD-10-CM | POA: Diagnosis not present

## 2016-06-23 ENCOUNTER — Telehealth (HOSPITAL_COMMUNITY): Payer: Self-pay | Admitting: *Deleted

## 2016-06-23 ENCOUNTER — Encounter (HOSPITAL_COMMUNITY)
Admission: RE | Admit: 2016-06-23 | Discharge: 2016-06-23 | Disposition: A | Discharge status: Home / Self Care | Payer: Medicare Other | Source: Ambulatory Visit | Attending: Cardiology | Admitting: Cardiology

## 2016-06-23 DIAGNOSIS — I5022 Chronic systolic (congestive) heart failure: Secondary | ICD-10-CM

## 2016-06-23 NOTE — Telephone Encounter (Signed)
-----   Message from Larey Dresser, MD sent at 06/21/2016  9:03 PM EDT ----- Regarding: RE: bp paramaters at cardiac rehab OK for SBP > 85 if asymptomatic.   ----- Message ----- From: Rowe Pavy, RN Sent: 06/21/2016   5:08 PM To: Larey Dresser, MD Subject: bp paramaters at cardiac rehab                 Dr. Aundra Dubin,  The above pt is exercising with [place] at cardiac rehab. Pt has soft bp uppper 80's to low 90's (asymptomatic) both resting and exertional here. I understand the  need to stay on the current regimen for optimal management of her heart failure.   For this particular pt, what is an acceptable resting/ exertional bp for this pt? Had appt scheduled with you this afternoon in the heart failure clinic   [facility] RN

## 2016-06-25 ENCOUNTER — Encounter (HOSPITAL_COMMUNITY)
Admission: RE | Admit: 2016-06-25 | Discharge: 2016-06-25 | Disposition: A | Discharge status: Home / Self Care | Payer: Medicare Other | Source: Ambulatory Visit | Attending: Cardiology | Admitting: Cardiology

## 2016-06-25 DIAGNOSIS — I5022 Chronic systolic (congestive) heart failure: Secondary | ICD-10-CM

## 2016-06-25 NOTE — Progress Notes (Signed)
Kim Mullins 82 y.o. female Nutrition Note Spoke with pt. Nutrition Plan and Nutrition Survey goals reviewed with pt. Pt is following Step 1 of the Therapeutic Lifestyle Changes diet. Age-appropriate nutrition recommendations discussed. Pt wants to lose wt. Pt has been trying to lose wt by increasing her activity. Pt reports a history of intentional wt loss after she was diagnosed with DM "6-7 years ago." Per pt, her highest wt was 211 lb. Pt's current wt is down 46 lb from highest wt. Pt is a diet-controlled diabetic. Last A1c indicates blood glucose well-controlled. This Probation officer went over Diabetes Education test results. Pt does not check CBG's regularly. Pt with dx of CHF. Per discussion, pt uses some canned foods for convenience. Pt states she has recently not felt like cooking often. Pt expressed understanding of the information reviewed. Pt aware of nutrition education classes offered and is unable to attend nutrition classes due to a social obligation every Tuesday.  Lab Results  Component Value Date   HGBA1C 5.7 (H) 04/13/2016   Wt Readings from Last 3 Encounters:  06/02/16 165 lb 12.6 oz (75.2 kg)  05/21/16 164 lb 12.8 oz (74.8 kg)  04/26/16 157 lb (71.2 kg)    Nutrition Diagnosis ? Food-and nutrition-related knowledge deficit related to lack of exposure to information as related to diagnosis of: ? CVD ? DM  ? Overweight related to excessive energy intake as evidenced by a BMI of 29.4  Nutrition Intervention ? Pt's individual nutrition plan reviewed with pt. ? Benefits of adopting Therapeutic Lifestyle Changes discussed when Medficts reviewed. ? Pt to attend the Portion Distortion class ? Pt to attend the Diabetes Q & A class ? Pt given handouts for: ? Nutrition I class ? Nutrition II class ? Diabetes Blitz Class  ? Continue client-centered nutrition education by RD, as part of interdisciplinary care. Goal(s) ? Pt to identify food quantities necessary to achieve weight loss  of 6-15 lb at graduation from cardiac rehab.  Monitor and Evaluate progress toward nutrition goal with team. Derek Mound, M.Ed, RD, LDN, CDE 06/25/2016 1:46 PM

## 2016-06-27 ENCOUNTER — Other Ambulatory Visit: Payer: Self-pay | Admitting: Cardiology

## 2016-06-27 ENCOUNTER — Encounter (HOSPITAL_COMMUNITY): Payer: Self-pay

## 2016-06-28 ENCOUNTER — Encounter (HOSPITAL_COMMUNITY)
Admission: RE | Admit: 2016-06-28 | Discharge: 2016-06-28 | Disposition: A | Discharge status: Home / Self Care | Payer: Medicare Other | Source: Ambulatory Visit | Attending: Cardiology | Admitting: Cardiology

## 2016-06-28 DIAGNOSIS — I5022 Chronic systolic (congestive) heart failure: Secondary | ICD-10-CM | POA: Diagnosis not present

## 2016-06-28 LAB — GLUCOSE, CAPILLARY: Glucose-Capillary: 110 mg/dL — ABNORMAL HIGH (ref 65–99)

## 2016-06-29 ENCOUNTER — Ambulatory Visit (INDEPENDENT_AMBULATORY_CARE_PROVIDER_SITE_OTHER): Payer: Medicare Other | Admitting: Neurology

## 2016-06-29 ENCOUNTER — Encounter: Payer: Self-pay | Admitting: Neurology

## 2016-06-29 VITALS — BP 94/64 | HR 76 | Ht 63.0 in | Wt 164.0 lb

## 2016-06-29 DIAGNOSIS — I5022 Chronic systolic (congestive) heart failure: Secondary | ICD-10-CM | POA: Diagnosis not present

## 2016-06-29 DIAGNOSIS — G458 Other transient cerebral ischemic attacks and related syndromes: Secondary | ICD-10-CM

## 2016-06-29 DIAGNOSIS — I442 Atrioventricular block, complete: Secondary | ICD-10-CM | POA: Diagnosis not present

## 2016-06-29 DIAGNOSIS — G4733 Obstructive sleep apnea (adult) (pediatric): Secondary | ICD-10-CM | POA: Diagnosis not present

## 2016-06-29 DIAGNOSIS — I952 Hypotension due to drugs: Secondary | ICD-10-CM

## 2016-06-29 DIAGNOSIS — I480 Paroxysmal atrial fibrillation: Secondary | ICD-10-CM | POA: Diagnosis not present

## 2016-06-29 DIAGNOSIS — Z950 Presence of cardiac pacemaker: Secondary | ICD-10-CM

## 2016-06-29 DIAGNOSIS — Z7901 Long term (current) use of anticoagulants: Secondary | ICD-10-CM

## 2016-06-29 NOTE — Progress Notes (Signed)
STROKE NEUROLOGY FOLLOW UP NOTE  NAME: Kim Mullins DOB: 1929-09-23  REASON FOR VISIT: stroke follow up HISTORY FROM: pt and chart  Today we had the pleasure of seeing Kim Mullins in follow-up at our Neurology Clinic. Pt was accompanied by no one.   History Summary Kim Mullins is a 82 y.o. female with history of chronic systolic CHF/nonischemic cardiomyopathy, complete AVB s/p pacemaker placement, paroxysmal atrial fibrillation on eliquis 2.5mg  bid, history of CHF last EF 15-20% in 01/2016 and OSA on CPAP was admitted on 04/12/16 for visual field cut. Symptoms resolved in 1-2 hours. She also had transient slurred speech one week PTA in [place]. Had old stroke in 2016 with flashing lights all over the visual field, following with Dr. Leonie Man in clinic. During admission, noticed patient BP at 90s on multiple BP meds, had cardiology consult and hold off all BP meds. Also found that her eliquis was under dosed, and then increased to 5mg  bid. Her symptoms could be due to hypoperfusion vs. eliquis underdosing.  CTA head and neck, CUS, TTE unremarkable. EF 40-45%. LDL 46 and A1C 5.7. She was discharged in good condition.   Interval History During the interval time, the patient has been doing well.  No recurrent stroke like symptoms. On eliquis 5mg  bid without bleeding side effects. However, her BP today 94/64, still low and she was put back on coreg, lasix, lisinopril and spironolactone by cardiology for fluid overload on 05/21/16. She missed cardiology follow up on 06/21/16. Currently asymptomatic. [place].    REVIEW OF SYSTEMS: Full 14 system review of systems performed and notable only for those listed below and in HPI above, all others are negative:  Constitutional:   Cardiovascular:  Ear/Nose/Throat:  Ringing in ears, runny nose Skin:  Eyes:   Respiratory:   Gastroitestinal:  constipation Genitourinary:  Hematology/Lymphatic:     Endocrine:  Musculoskeletal:   Allergy/Immunology:  Food allergies, Env allergies Neurological:   Psychiatric:  Sleep: apnea  The neurologically relevant items on the patient's problem list were reviewed on today's visit.  Neurologic Examination  A problem focused neurological exam (12 or more points of the single system neurologic examination, vital signs counts as 1 point, cranial nerves count for 8 points) was performed.  Blood pressure 94/64, pulse 76, height 5\' 3"  (1.6 m), weight 164 lb (74.4 kg).  General - Well nourished, well developed, in no apparent distress.  Ophthalmologic - Sharp disc margins OU.   Cardiovascular - Regular rate and rhythm with no murmur.  Mental Status -  Level of arousal and orientation to time, place, and person were intact. Language including expression, naming, repetition, comprehension was assessed and found intact. Fund of Knowledge was assessed and was intact.  Cranial Nerves II - XII - II - Visual field intact OU. III, IV, VI - Extraocular movements intact. V - Facial sensation intact bilaterally. VII - Facial movement intact bilaterally. VIII - Hearing & vestibular intact bilaterally. X - Palate elevates symmetrically. XI - Chin turning & shoulder shrug intact bilaterally. XII - Tongue protrusion intact.  Motor Strength - The patient's strength was normal in all extremities and pronator drift was absent.  Bulk was normal and fasciculations were absent.   Motor Tone - Muscle tone was assessed at the neck and appendages and was normal.  Reflexes - The patient's reflexes were 1+ in all extremities and she had no pathological reflexes.  Sensory - Light touch, temperature/pinprick, vibration and  proprioception, and Romberg testing were assessed and were normal.    Coordination - The patient had normal movements in the hands and feet with no ataxia or dysmetria.  Tremor was absent.  Gait and Station - The patient's transfers, posture,  gait, station, and turns were observed as normal.   Functional score  mRS = 0   0 - No symptoms.   1 - No significant disability. Able to carry out all usual activities, despite some symptoms.   2 - Slight disability. Able to look after own affairs without assistance, but unable to carry out all previous activities.   3 - Moderate disability. Requires some help, but able to walk unassisted.   4 - Moderately severe disability. Unable to attend to own bodily needs without assistance, and unable to walk unassisted.   5 - Severe disability. Requires constant nursing care and attention, bedridden, incontinent.   6 - Dead.   NIH Stroke Scale = 0   Data reviewed: I personally reviewed the images and agree with the radiology interpretations.  Ct Head Code Stroke Wo Contrast 04/12/2016 1. Stable and normal for age Normal noncontrast CT appearance of the brain. 2. ASPECTS is 10.   Carotid Doppler   There is 1-39% bilateral ICA stenosis. Vertebral artery flow is antegrade.    2-D echo - Left ventricle: The cavity size was normal. There was moderate concentric hypertrophy. Systolic function was mildly to moderately reduced. The estimated ejection fraction was in the range of 40% to 45%. Diffuse hypokinesis. The study is not technically sufficient to allow evaluation of LV diastolic function. - Mitral valve: There was mild regurgitation. - Left atrium: The atrium was mildly dilated. - Right atrium: The atrium was mildly dilated. Impressions: - No cardiac source of emboli was indentified.  CTA head and neck  IMPRESSION: CTA NECK: No hemodynamically significant stenosis or acute vascular process. RIGHT retropectoral lymphadenopathy partially imaged. Recommend correlation with mammography on a nonemergent basis in dedicated follow-up. Patulous esophagus with air-fluid level presents aspiration risk. CTA HEAD: Negative.   Component     Latest Ref Rng & Units 04/13/2016   Cholesterol     0 - 200 mg/dL 90  Triglycerides     <150 mg/dL 69  HDL Cholesterol     >40 mg/dL 30 (L)  Total CHOL/HDL Ratio     RATIO 3.0  VLDL     0 - 40 mg/dL 14  LDL (calc)     0 - 99 mg/dL 46  Hemoglobin A1C     4.8 - 5.6 % 5.7 (H)  Mean Plasma Glucose     mg/dL 117    Assessment: As you may recall, she is a 82 y.o. African American female with PMH of chronic systolic CHF/nonischemic cardiomyopathy, complete AVB s/p pacemaker placement, PAF on eliquis 2.5mg  bid, history of CHF last EF 15-20% in 01/2016 and OSA on CPAP was admitted on 04/12/16 for visual field cut. Symptoms resolved in 1-2 hours. She also had transient slurred speech one week PTA in [place]. Had old stroke in 2016 with flashing lights all over the visual field, following with Dr. Leonie Man in clinic. During admission, noticed patient BP at 90s on multiple BP meds, had cardiology consult and hold off all BP meds. Also found that her eliquis was under dosed, and then increased to 5mg  bid. Her symptoms could be due to hypoperfusion vs. eliquis underdosing.  CTA head and neck, CUS, TTE unremarkable. EF 40-45%. LDL 46 and A1C 5.7. She was  discharged in good condition. During the interval time, the patient has been doing well.  No recurrent stroke like symptoms. On eliquis 5mg  bid without bleeding side effects. However, her BP today 94/64, still low and she was put back on coreg, lasix, lisinopril and spironolactone by cardiology for fluid overload on 05/21/16. She missed cardiology follow up on 06/21/16, need to set up appointment with cardiology    Plan:  - continue eliquis 5mg  bid for stroke prevention - make appointment with Dr. Aundra Dubin ASAP for follow up - check BP at home and record and bring over to Dr. Aundra Dubin for medication adjustment if needed - Follow up with your primary care physician for stroke risk factor modification. Recommend maintain blood pressure goal <130/80, diabetes with hemoglobin A1c goal below 7.0% and  lipids with LDL cholesterol goal below 70 mg/dL.  - healthy diet and regular exercise - continue to follow up with Dr. Leonie Man in 4 months.  I spent more than 25 minutes of face to face time with the patient. Greater than 50% of time was spent in counseling and coordination of care. We discussed cardiology follow up, compliance with eliquis, and follow up with Dr. Leonie Man.    No orders of the defined types were placed in this encounter.   No orders of the defined types were placed in this encounter.   Patient Instructions  - continue eliquis for stroke prevention - make appointment with Dr. Aundra Dubin ASAP for follow up - check BP at home and record and bring over to Dr. Aundra Dubin for medication adjustment - Follow up with your primary care physician for stroke risk factor modification. Recommend maintain blood pressure goal <130/80, diabetes with hemoglobin A1c goal below 7.0% and lipids with LDL cholesterol goal below 70 mg/dL.  - do not miss medications during travel and avoid overexertion.  - follow up with Dr. Leonie Man in 4 months.    Rosalin Hawking, MD PhD [facility] [address] [place], [place] [address] [phone]

## 2016-06-29 NOTE — Patient Instructions (Signed)
-   continue eliquis for stroke prevention - make appointment with Dr. Aundra Dubin ASAP for follow up - check BP at home and record and bring over to Dr. Aundra Dubin for medication adjustment - Follow up with your primary care physician for stroke risk factor modification. Recommend maintain blood pressure goal <130/80, diabetes with hemoglobin A1c goal below 7.0% and lipids with LDL cholesterol goal below 70 mg/dL.  - do not miss medications during travel and avoid overexertion.  - follow up with Dr. Leonie Man in 4 months.

## 2016-06-30 ENCOUNTER — Encounter (HOSPITAL_COMMUNITY)
Admission: RE | Admit: 2016-06-30 | Discharge: 2016-06-30 | Disposition: A | Discharge status: Home / Self Care | Payer: Medicare Other | Source: Ambulatory Visit | Attending: Cardiology | Admitting: Cardiology

## 2016-06-30 ENCOUNTER — Encounter (HOSPITAL_COMMUNITY): Payer: Self-pay

## 2016-06-30 DIAGNOSIS — I5022 Chronic systolic (congestive) heart failure: Secondary | ICD-10-CM | POA: Insufficient documentation

## 2016-07-02 ENCOUNTER — Encounter (HOSPITAL_COMMUNITY)
Admission: RE | Admit: 2016-07-02 | Discharge: 2016-07-02 | Disposition: A | Discharge status: Home / Self Care | Payer: Medicare Other | Source: Ambulatory Visit | Attending: Cardiology | Admitting: Cardiology

## 2016-07-02 DIAGNOSIS — I5022 Chronic systolic (congestive) heart failure: Secondary | ICD-10-CM

## 2016-07-02 LAB — GLUCOSE, CAPILLARY: Glucose-Capillary: 131 mg/dL — ABNORMAL HIGH (ref 65–99)

## 2016-07-05 ENCOUNTER — Encounter (HOSPITAL_COMMUNITY): Payer: Medicare Other

## 2016-07-05 ENCOUNTER — Ambulatory Visit (HOSPITAL_COMMUNITY)
Admission: RE | Admit: 2016-07-05 | Discharge: 2016-07-05 | Disposition: A | Discharge status: Home / Self Care | Payer: Medicare Other | Source: Ambulatory Visit | Attending: Cardiology | Admitting: Cardiology

## 2016-07-05 VITALS — BP 92/56 | HR 64 | Wt 159.5 lb

## 2016-07-05 DIAGNOSIS — I442 Atrioventricular block, complete: Secondary | ICD-10-CM | POA: Insufficient documentation

## 2016-07-05 DIAGNOSIS — G4733 Obstructive sleep apnea (adult) (pediatric): Secondary | ICD-10-CM | POA: Insufficient documentation

## 2016-07-05 DIAGNOSIS — Z79899 Other long term (current) drug therapy: Secondary | ICD-10-CM | POA: Insufficient documentation

## 2016-07-05 DIAGNOSIS — Z8673 Personal history of transient ischemic attack (TIA), and cerebral infarction without residual deficits: Secondary | ICD-10-CM | POA: Insufficient documentation

## 2016-07-05 DIAGNOSIS — J45909 Unspecified asthma, uncomplicated: Secondary | ICD-10-CM | POA: Insufficient documentation

## 2016-07-05 DIAGNOSIS — I429 Cardiomyopathy, unspecified: Secondary | ICD-10-CM | POA: Insufficient documentation

## 2016-07-05 DIAGNOSIS — K219 Gastro-esophageal reflux disease without esophagitis: Secondary | ICD-10-CM | POA: Insufficient documentation

## 2016-07-05 DIAGNOSIS — Z87891 Personal history of nicotine dependence: Secondary | ICD-10-CM | POA: Insufficient documentation

## 2016-07-05 DIAGNOSIS — E119 Type 2 diabetes mellitus without complications: Secondary | ICD-10-CM | POA: Diagnosis not present

## 2016-07-05 DIAGNOSIS — Z7901 Long term (current) use of anticoagulants: Secondary | ICD-10-CM | POA: Diagnosis not present

## 2016-07-05 DIAGNOSIS — I480 Paroxysmal atrial fibrillation: Secondary | ICD-10-CM | POA: Diagnosis not present

## 2016-07-05 DIAGNOSIS — I5022 Chronic systolic (congestive) heart failure: Secondary | ICD-10-CM | POA: Diagnosis not present

## 2016-07-05 LAB — CBC
HCT: 41.3 % (ref 36.0–46.0)
HEMOGLOBIN: 13.3 g/dL (ref 12.0–15.0)
MCH: 29.4 pg (ref 26.0–34.0)
MCHC: 32.2 g/dL (ref 30.0–36.0)
MCV: 91.4 fL (ref 78.0–100.0)
Platelets: 260 10*3/uL (ref 150–400)
RBC: 4.52 MIL/uL (ref 3.87–5.11)
RDW: 13.9 % (ref 11.5–15.5)
WBC: 3.7 10*3/uL — ABNORMAL LOW (ref 4.0–10.5)

## 2016-07-05 LAB — BASIC METABOLIC PANEL
Anion gap: 10 (ref 5–15)
BUN: 24 mg/dL — ABNORMAL HIGH (ref 6–20)
CHLORIDE: 103 mmol/L (ref 101–111)
CO2: 26 mmol/L (ref 22–32)
CREATININE: 1.37 mg/dL — AB (ref 0.44–1.00)
Calcium: 9.2 mg/dL (ref 8.9–10.3)
GFR calc non Af Amer: 35 mL/min — ABNORMAL LOW (ref >60)
GFR, EST AFRICAN AMERICAN: 40 mL/min — AB (ref >60)
GLUCOSE: 89 mg/dL (ref 65–99)
Potassium: 4.6 mmol/L (ref 3.5–5.1)
Sodium: 139 mmol/L (ref 135–145)

## 2016-07-05 NOTE — Patient Instructions (Signed)
Labs today  We will contact you in 2 months to schedule your next appointment.  

## 2016-07-06 NOTE — Progress Notes (Signed)
Patient ID: Kim Mullins, female   DOB: 1929-09-20, 82 y.o.   MRN: [id number] PCP: Dr. Ayesha Rumpf Cardiology: Dr. Radford Pax HF Cardiology: Dr. Aundra Dubin  82 yo with history of chronic systolic CHF/nonischemic cardiomyopathy, prior complete heart block, OSA, and paroxysmal atrial fibrillation presents for CHF clinic evaluation.  She was diagnosed with a cardiomyopathy back in 2015.  She had right and left heart cath in 12/15, showing no significant coronary disease.  EF was in the 30-35% range.  She was admitted in 2/16 with symptomatic bradycardia/complete heart block and had Medtronic CRT-P placed.  In 4/16, EF was back up to 50-55%.  However, repeat echo in 6/17 showed EF down to 15-20% with RV dysfunction.    She was admitted in 8/17 with suspected TIA => dysarthria and blurred vision.  She had been on Eliquis 2.5 mg bid, this was increased to 5 mg bid.  Lisinopril was stopped and Coreg was decreased due to soft BP.  Echo was done that admission, showing improvement in EF to 40-45%.    No dyspnea walking on flat ground. She uses a cane or walker. SBP running 90s-110s.  No orthopnea/PND.  No current neurological symptoms. No lightheadedness or dizziness.  No BRBPR or melena.  Weight is down 5 lbs.  She has been going to cardiac rehab. Plans to go to [place] with her grand-daughter in 12/17.   Optivol: Fluid index > threshold with low impedance in 10/17, no index < threshold and impedance back up, >99% BiV pacing, rare atrial fibrillation, > 2 hrs/day activity.   Labs (3/17): HCT 36.9 Labs (5/17): K 4.2, creatinine 0.98 Labs (7/17): K 4.7, creatinine 1.11, BNP 304 Labs (8/17): K 4, creatinine 0.96, hgb 10.3, LDL 46, M-spike on SPEP.  Labs (9/17): Urine IFE negative, K 4.2, creatinine 1.2, hgb 12 Labs (10/17): K 3.8, creatinine 0.99  PMH: 1. Type II diabetes: Diet-controlled.  2. GERD: s/p esophageal dilatation.  3. Chronic systolic CHF: 1st noted in 2015. Nonischemic cardiomyopathy.  - Echo  (11/15) with EF 40-45%.  - LHC/RHC (12/15): Normal coronaries, EF 30-35%, mean RA 9, PA 44/16 mean 31, unable to obtain PCWP, CI 2.5.  - Developed CHB and had Medtronic CRT-P placed in 2/16. - Echo (4/16) with EF 50-55%.  - Echo (6/17): EF 15-20%, moderate LVh, restrictive diastolic function, mild MR, RV moderately dilated and moderately decreased in function, moderate TR, PASP 46 mmHg.  - Echo (8/17): EF 40-45%, diffuse hypokinesis, mild MR.  4. Complete heart block: Medtronic CRT-P in 2/16.  5. OSA: Uses CPAP 6. Asthma 7. Atrial fibrillation: Paroxysmal.  8. H/o CVA.  Possible TIA in 8/17.    - Carotid dopplers (8/17) with 1-39% BICA stenosis.   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   FH: No cardiac problems that she knows of.   ROS: All systems reviewed and negative except as per HPI.   Current Outpatient Prescriptions  Medication Sig Dispense Refill  . Acetaminophen (TYLENOL) 325 MG CAPS Take 325 mg by mouth daily as needed (for shoulder pain).     Marland Kitchen apixaban (ELIQUIS) 5 MG TABS tablet Take 1 tablet (5 mg total) by mouth 2 (two) times daily. 60 tablet 6  . Ascorbic Acid (VITAMIN C PO) Take 1 tablet by mouth daily. Reported on 12/19/2015    . Bisacodyl (LAXATIVE PO) Take 1 tablet by mouth daily as needed (for constipation).     . CALCIUM PO  Take 1 tablet by mouth daily.    . carvedilol (COREG) 3.125 MG tablet TAKE 1 TABLET BY MOUTH TWICE DAILY WITH A MEAL 60 tablet 0  . diclofenac sodium (VOLTAREN) 1 % GEL Apply 2 g topically 3 (three) times daily as needed (pain, inflammation). 100 g 0  . diphenhydramine-acetaminophen (TYLENOL PM) 25-500 MG TABS tablet Take 1 tablet by mouth at bedtime as needed (sleep).     . dorzolamide (TRUSOPT) 2 % ophthalmic solution Place 1 drop into both eyes 2 (two) times daily.   11  . furosemide (LASIX) 40 MG tablet Take 1 tablet (40 mg total) by mouth 2 (two) times daily. 180 tablet  3  . lisinopril (PRINIVIL,ZESTRIL) 2.5 MG tablet Take 1 tablet (2.5 mg total) by mouth every evening. 30 tablet 3  . spironolactone (ALDACTONE) 25 MG tablet Take 0.5 tablets (12.5 mg total) by mouth at bedtime. 15 tablet 6  . timolol (TIMOPTIC) 0.5 % ophthalmic solution Place 1 drop into both eyes daily.   11   No current facility-administered medications for this encounter.    BP (!) 92/56   Pulse 64   Wt 159 lb 8 oz (72.3 kg)   SpO2 99%   BMI 28.25 kg/m  General: NAD Neck: JVP 8-9 cm, no thyromegaly or thyroid nodule.  Lungs: Clear to auscultation bilaterally with normal respiratory effort. CV: Nondisplaced PMI.  Heart regular S1/S2, no S3/S4, no murmur.  No peripheral edema.  No carotid bruit.  Normal pedal pulses.  Abdomen: Soft, nontender, no hepatosplenomegaly, no distention.  Skin: Intact without lesions or rashes.  Neurologic: Alert and oriented x 3.  Psych: Normal affect. Extremities: No clubbing or cyanosis.  HEENT: Normal.   Assessment/Plan: 1. Chronic systolic CHF: Nonischemic cardiomyopathy, no coronary disease on cath in 12/15. Etiology uncertain => ?viral myocarditis.  She does not remember a family history of cardiomyopathy and has never drunk ETOH heavily.  LV function initially improved in 2016, but EF back down to 15-20% with RV dysfunction on 6/17 echo.  Most recent echo in 8/17 actually showed improved EF to 40-45%.   NYHA class II currently.  No volume overload by exam or Optivol.  Weight is down.  She is BiV pacing near 100%.  - Continue Coreg 3.125 mg bid, no BP room to increase. - Continue spironolactone 12.5 daily, no BP room to increase.  - Continue lisinopril 2.5 mg to be taken in the evening, no BP room to increase.   - Continue Lasix 40 mg bid.  - BMET today.   - Abnormal SPEP => urine immunofixation normal, will send serum immunofixation today.   - Has CRT device, cannot get MRI.  2. Complete heart block: Medtronic CRT-P.  3. Atrial fibrillation:  Paroxysmal, in NSR today.  She has history of CVA and TIA, CHADSVASC = 6.  Had suspected TIA in 81/7.  Suspect that apixaban was under-dosed, now increased to 5 mg bid.  - CBC today.  4. OSA: Uses CPAP.   Followup in 2 months.   [name] 07/06/2016

## 2016-07-07 ENCOUNTER — Encounter (HOSPITAL_COMMUNITY)
Admission: RE | Admit: 2016-07-07 | Discharge: 2016-07-07 | Disposition: A | Discharge status: Home / Self Care | Payer: Medicare Other | Source: Ambulatory Visit | Attending: Cardiology | Admitting: Cardiology

## 2016-07-07 DIAGNOSIS — I5022 Chronic systolic (congestive) heart failure: Secondary | ICD-10-CM | POA: Diagnosis not present

## 2016-07-07 LAB — IMMUNOFIXATION ELECTROPHORESIS
IGA: 275 mg/dL (ref 64–422)
IGG (IMMUNOGLOBIN G), SERUM: 1512 mg/dL (ref 700–1600)
IGM, SERUM: 542 mg/dL — AB (ref 26–217)
Total Protein ELP: 7.6 g/dL (ref 6.0–8.5)

## 2016-07-08 ENCOUNTER — Telehealth (HOSPITAL_COMMUNITY): Payer: Self-pay | Admitting: *Deleted

## 2016-07-08 NOTE — Telephone Encounter (Signed)
Notes Recorded by Harvie Junior, [place] on 07/08/2016 at 3:30 PM EST Patient aware.  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

## 2016-07-08 NOTE — Telephone Encounter (Signed)
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

## 2016-07-08 NOTE — Progress Notes (Signed)
Cardiac Individual Treatment Plan  Patient Details  Name: Kim Mullins MRN: [id number] Date of Birth: 1930-03-27 Referring Provider:   Flowsheet Row CARDIAC REHAB PHASE II ORIENTATION from 06/01/2016 in [place]  Referring Provider  [name], MD      Initial Encounter Date:  [place] PHASE II ORIENTATION from 06/01/2016 in [place]  Date  06/01/16  Referring Provider  [name], MD      Visit Diagnosis: Chronic systolic congestive heart failure [facility])  Patient's Home Medications on Admission:  Current Outpatient Prescriptions:  .  Acetaminophen (TYLENOL) 325 MG CAPS, Take 325 mg by mouth daily as needed (for shoulder pain). , Disp: , Rfl:  .  apixaban (ELIQUIS) 5 MG TABS tablet, Take 1 tablet (5 mg total) by mouth 2 (two) times daily., Disp: 60 tablet, Rfl: 6 .  Ascorbic Acid (VITAMIN C PO), Take 1 tablet by mouth daily. Reported on 12/19/2015, Disp: , Rfl:  .  Bisacodyl (LAXATIVE PO), Take 1 tablet by mouth daily as needed (for constipation). , Disp: , Rfl:  .  CALCIUM PO, Take 1 tablet by mouth daily., Disp: , Rfl:  .  carvedilol (COREG) 3.125 MG tablet, TAKE 1 TABLET BY MOUTH TWICE DAILY WITH A MEAL, Disp: 60 tablet, Rfl: 0 .  diclofenac sodium (VOLTAREN) 1 % GEL, Apply 2 g topically 3 (three) times daily as needed (pain, inflammation)., Disp: 100 g, Rfl: 0 .  diphenhydramine-acetaminophen (TYLENOL PM) 25-500 MG TABS tablet, Take 1 tablet by mouth at bedtime as needed (sleep). , Disp: , Rfl:  .  dorzolamide (TRUSOPT) 2 % ophthalmic solution, Place 1 drop into both eyes 2 (two) times daily. , Disp: , Rfl: 11 .  furosemide (LASIX) 40 MG tablet, Take 1 tablet (40 mg total) by mouth 2 (two) times daily., Disp: 180 tablet, Rfl: 3 .  lisinopril (PRINIVIL,ZESTRIL) 2.5 MG tablet, Take 1 tablet (2.5 mg total) by mouth every evening., Disp: 30 tablet, Rfl: 3 .  spironolactone (ALDACTONE) 25  MG tablet, Take 0.5 tablets (12.5 mg total) by mouth at bedtime., Disp: 15 tablet, Rfl: 6 .  timolol (TIMOPTIC) 0.5 % ophthalmic solution, Place 1 drop into both eyes daily. , Disp: , Rfl: 11  Past Medical History: Past Medical History:  Diagnosis Date  . Anemia    occassionally  . Arthritis    all over.  . Asthma    Allergixc reaction to cats.  . Atrial fibrillation ([place])   . Bowel obstruction   . Chronic systolic CHF (congestive heart failure) (HCC)    EF 30-35% by cath, echo 2016 EF 50%  . Complete heart block (HCC)    a. s/p MDT CRTP pacemaker  . DCM (dilated cardiomyopathy) ([place]) 08/16/2014   normal coronary arteries on cath with EF 30-45%.  EF now 50% by echo 11/2014  . Diabetes mellitus 2006   Diet and exercise controlled.  [place] GERD (gastroesophageal reflux disease)    occ  . Hypertension   . Left tibial fracture 2007  . OSA (obstructive sleep apnea) 10/23/2014   Moderate with AHI 21/hr  . Osteoarthritis of right shoulder region 06/26/2013  . Stroke [facility])     Tobacco Use: History  Smoking Status  . Former Smoker  . Packs/day: 1.00  . Years: 45.00  . Types: Cigarettes  . Quit date: 07/24/1978  Smokeless Tobacco  . Never Used    Labs: Recent Review [employer]  for ITP Cardiac and Pulmonary Rehab Latest Ref Rng & Units 08/29/2014 06/18/2015 06/19/2015 04/12/2016 04/13/2016   Cholestrol 0 - 200 mg/dL - - 110 - 90   LDLCALC 0 - 99 mg/dL - - 50 - 46   HDL >40 mg/dL - - 49 - 30(L)   Trlycerides <150 mg/dL - - 55 - 69   Hemoglobin A1c 4.8 - 5.6 % - - 5.8(H) - 5.7(H)   PHART 7.350 - 7.450 7.376 - - - -   PCO2ART 35.0 - 45.0 mmHg 40.0 - - - -   HCO3 20.0 - 24.0 mEq/L 23.4 - - - -   TCO2 0 - 100 mmol/L 25 22 - 20 -   ACIDBASEDEF 0.0 - 2.0 mmol/L 2.0 - - - -   O2SAT % 98.0 - - - -      Capillary Blood Glucose: Lab Results  Component Value Date   GLUCAP 131 (H) 07/02/2016   GLUCAP 110 (H) 06/28/2016   GLUCAP 96 06/11/2016   GLUCAP 87 06/09/2016    GLUCAP 96 06/09/2016     Exercise Target Goals:    Exercise Program Goal: Individual exercise prescription set with THRR, safety & activity barriers. Participant demonstrates ability to understand and report RPE using BORG scale, to self-measure pulse accurately, and to acknowledge the importance of the exercise prescription.  Exercise Prescription Goal: Starting with aerobic activity 30 plus minutes a day, 3 days per week for initial exercise prescription. Provide home exercise prescription and guidelines that participant acknowledges understanding prior to discharge.  Activity Barriers & Risk Stratification:     Activity Barriers & Cardiac Risk Stratification - 06/01/16 1653      Activity Barriers & Cardiac Risk Stratification   Cardiac Risk Stratification Moderate      6 Minute Walk:     6 Minute Walk    Row Name 06/01/16 1655         6 Minute Walk   Phase Initial     Distance 465 feet     Walk Time 6 minutes     # of Rest Breaks 1     MPH 1.49     METS 0.39     RPE 13     VO2 Peak 1.38     Symptoms Yes (comment)     Comments fatigue, rest break at 3:03, restart 5:30     Resting HR 64 bpm     Resting BP 103/64     Max Ex. HR 104 bpm     Max Ex. BP 104/60     2 Minute Post BP 108/60        Initial Exercise Prescription:     Initial Exercise Prescription - 06/02/16 0800      Date of Initial Exercise RX and Referring Provider   Date 06/01/16   Referring Provider [name], MD     Bike   Level 0.5   Minutes 10   METs 2.26     NuStep   Level 1   Minutes 10   METs 2     Track   Laps 5   Minutes 10   METs 1.84     Prescription Details   Frequency (times per week) 3   Duration Progress to 30 minutes of continuous aerobic without signs/symptoms of physical distress     Intensity   THRR 40-80% of Max Heartrate 55-110   Ratings of Perceived Exertion 11-13   Perceived Dyspnea 0-4     Progression  Progression Continue to progress  workloads to maintain intensity without signs/symptoms of physical distress.     Resistance Training   Training Prescription Yes   Weight 1lb   Reps 10-12      Perform Capillary Blood Glucose checks as needed.  Exercise Prescription Changes:     Exercise Prescription Changes    Row Name 06/09/16 1600 06/23/16 1600 07/07/16 1300         Response to Exercise   Blood Pressure (Admit) 111/66 114/76 94/68     Blood Pressure (Exercise) 106/60 98/60 94/60   110/60 rchk     Blood Pressure (Exit) 104/60 68/62 96/68      Heart Rate (Admit) 83 bpm 67 bpm 99 bpm     Heart Rate (Exercise) 103 bpm 96 bpm 102 bpm     Heart Rate (Exit) 71 bpm 67 bpm 96 bpm     Rating of Perceived Exertion (Exercise) 12 12 15      Symptoms  - none none     Comments  - Reviewed home exercise guidelines on 06/14/16. Reviewed home exercise guidelines on 06/14/16.     Duration  - Progress to 30 minutes of continuous aerobic without signs/symptoms of physical distress Progress to 30 minutes of continuous aerobic without signs/symptoms of physical distress     Intensity  - THRR unchanged THRR unchanged       Progression   Progression  - Continue to progress workloads to maintain intensity without signs/symptoms of physical distress. Continue to progress workloads to maintain intensity without signs/symptoms of physical distress.     Average METs 2.21 1.8 1.9       Resistance Training   Training Prescription Yes Yes Yes     Weight 1lb 1lb 1lb     Reps 10-12 10-12 10-12       Interval Training   Interval Training  - No No       Treadmill   MPH 2 -  -     Grade 0 -  -     Minutes 10 -  -     METs 2.57 -  -       Bike   Level -  -  -     Minutes -  -  -     METs -  -  -       Recumbant Bike   Level  -  - 1.3     Minutes  -  - 10     METs  -  - 1.3       NuStep   Level 1 1 2      Minutes 10 20 20      METs 1.8 1.9 1.9       Arm Ergometer   Level 1 -  -     Minutes 10 -  -     METs 2.25 -  -        Track   Laps - 4 4     Minutes -  -  -     METs - 1.7 1.7       Home Exercise Plan   Plans to continue exercise at  - Home Home     Frequency  - Add 2 additional days to program exercise sessions. Add 2 additional days to program exercise sessions.        Exercise Comments:     Exercise Comments    Row Name 06/09/16 1639 06/14/16 1410 06/18/16 1240 07/07/16 1702  Exercise Comments Pt is off to a good start with exercise prescription Reviewed home exercise guidelines with participant. She plans to walk 10 minutes, 3x's/day, 1-2 days per week. Particiant has a recumbent bike which she plans to use for her home exercise. Reviewed METs and goals. Pt plans to continue with HEP (riding stationary recumbent bike)        Discharge Exercise Prescription (Final Exercise Prescription Changes):     Exercise Prescription Changes - 07/07/16 1300      Response to Exercise   Blood Pressure (Admit) 94/68   Blood Pressure (Exercise) 94/60  110/60 rchk   Blood Pressure (Exit) 96/68   Heart Rate (Admit) 99 bpm   Heart Rate (Exercise) 102 bpm   Heart Rate (Exit) 96 bpm   Rating of Perceived Exertion (Exercise) 15   Symptoms none   Comments Reviewed home exercise guidelines on 06/14/16.   Duration Progress to 30 minutes of continuous aerobic without signs/symptoms of physical distress   Intensity THRR unchanged     Progression   Progression Continue to progress workloads to maintain intensity without signs/symptoms of physical distress.   Average METs 1.9     Resistance Training   Training Prescription Yes   Weight 1lb   Reps 10-12     Interval Training   Interval Training No     Recumbant Bike   Level 1.3   Minutes 10   METs 1.3     NuStep   Level 2   Minutes 20   METs 1.9     Track   Laps 4   METs 1.7     Home Exercise Plan   Plans to continue exercise at Home   Frequency Add 2 additional days to program exercise sessions.      Nutrition:  Target Goals:  Understanding of nutrition guidelines, daily intake of sodium 1500mg , cholesterol 200mg , calories 30% from fat and 7% or less from saturated fats, daily to have 5 or more servings of fruits and vegetables.  Biometrics:     Pre Biometrics - 06/02/16 0817      Pre Biometrics   Height 5\' 3"  (1.6 m)   Weight 165 lb 12.6 oz (75.2 kg)   Waist Circumference 36.25 inches   Hip Circumference 44.75 inches   Waist to Hip Ratio 0.81 %   BMI (Calculated) 29.4   Triceps Skinfold 17 mm   % Body Fat 38.9 %   Grip Strength 24 kg   Flexibility 7.5 in   Single Leg Stand 0.59 seconds       Nutrition Therapy Plan and Nutrition Goals:     Nutrition Therapy & Goals - 06/25/16 1230      Nutrition Therapy   Diet Low Sodium     Personal Nutrition Goals   Personal Goal #1 1-2 lb wt loss/week to a goal wt loss 6-15 lb at graduation from [place], educate and counsel regarding individualized specific dietary modifications aiming towards targeted core components such as weight, hypertension, lipid management, diabetes, heart failure and other comorbidities.   Expected Outcomes Short Term Goal: Understand basic principles of dietary content, such as calories, fat, sodium, cholesterol and nutrients.;Long Term Goal: Adherence to prescribed nutrition plan.      Nutrition Discharge: Nutrition Scores:     Nutrition Assessments - 06/25/16 1230      MEDFICTS Scores   Pre Score 63      Nutrition Goals Re-Evaluation:  Psychosocial: Target Goals: Acknowledge presence or absence of depression, maximize coping skills, provide positive support system. Participant is able to verbalize types and ability to use techniques and skills needed for reducing stress and depression.  Initial Review & Psychosocial Screening:     Initial Psych Review & Screening - 06/01/16 [place]? Yes     Barriers   Psychosocial  barriers to participate in program There are no identifiable barriers or psychosocial needs.     Screening Interventions   Interventions Encouraged to exercise      Quality of Life Scores:     Quality of Life - 06/01/16 1704      Quality of Life Scores   Health/Function Pre 15.61 %   Socioeconomic Pre 25.64 %   Psych/Spiritual Pre 29.14 %   Family Pre 22.5 %   GLOBAL Pre 21.63 %      PHQ-9: Recent Review Flowsheet Data    There is no flowsheet data to display.      Psychosocial Evaluation and Intervention:     Psychosocial Evaluation - 07/08/16 1549      Psychosocial Evaluation & Interventions   Interventions Encouraged to exercise with the program and follow exercise prescription   Comments Pt denies any needs, pt has supportive family, pt generally feels positive about her future, no needs identified.     Discharge Psychosocial Assessment & Intervention   Discharge Continue support measures as needed      Psychosocial Re-Evaluation:     Psychosocial Re-Evaluation    [place] Name 07/08/16 1550             Psychosocial Re-Evaluation   Interventions Encouraged to attend Cardiac Rehabilitation for the exercise          Vocational Rehabilitation: Provide vocational rehab assistance to qualifying candidates.   Vocational Rehab Evaluation & Intervention:     Vocational Rehab - 06/01/16 1655      Initial Vocational Rehab Evaluation & Intervention   Assessment shows need for Vocational Rehabilitation No      Education: Education Goals: Education classes will be provided on a weekly basis, covering required topics. Participant will state understanding/return demonstration of topics presented.  Learning Barriers/Preferences:     Learning Barriers/Preferences - 06/01/16 1654      Learning Barriers/Preferences   Learning Barriers Sight   Learning Preferences Written Material;Verbal Instruction      Education Topics: Count Your Pulse:  -Group  instruction provided by verbal instruction, demonstration, patient participation and written materials to support subject.  Instructors address importance of being able to find your pulse and how to count your pulse when at home without a heart monitor.  Patients get hands on experience counting their pulse with staff help and individually. Flowsheet Row CARDIAC REHAB PHASE II EXERCISE from 07/07/2016 in [place]  Date  07/02/16  Educator  Arville Go RIon RN  Instruction Review Code  2- meets goals/outcomes      Heart Attack, Angina, and Risk Factor Modification:  -Group instruction provided by verbal instruction, video, and written materials to support subject.  Instructors address signs and symptoms of angina and heart attacks.    Also discuss risk factors for heart disease and how to make changes to improve heart health risk factors. Flowsheet Row CARDIAC REHAB PHASE II EXERCISE from 07/07/2016 in [place]  Date  06/16/16  Instruction Review Code  2- meets goals/outcomes  Functional Fitness:  -Group instruction provided by verbal instruction, demonstration, patient participation, and written materials to support subject.  Instructors address safety measures for doing things around the house.  Discuss how to get up and down off the floor, how to pick things up properly, how to safely get out of a chair without assistance, and balance training. Flowsheet Row CARDIAC REHAB PHASE II EXERCISE from 07/07/2016 in [place]  Date  06/18/16  Educator  Seward Carol  Instruction Review Code  2- meets goals/outcomes      Meditation and Mindfulness:  -Group instruction provided by verbal instruction, patient participation, and written materials to support subject.  Instructor addresses importance of mindfulness and meditation practice to help reduce stress and improve awareness.  Instructor also leads  participants through a meditation exercise.  Flowsheet Row CARDIAC REHAB PHASE II EXERCISE from 07/07/2016 in [place]  Date  06/23/16  Educator  Jeanella Craze  Instruction Review Code  2- meets goals/outcomes      Stretching for Flexibility and Mobility:  -Group instruction provided by verbal instruction, patient participation, and written materials to support subject.  Instructors lead participants through series of stretches that are designed to increase flexibility thus improving mobility.  These stretches are additional exercise for major muscle groups that are typically performed during regular warm up and cool down. Flowsheet Row CARDIAC REHAB PHASE II EXERCISE from 07/07/2016 in [place]  Date  06/25/16  Educator  Seward Carol  Instruction Review Code  2- meets goals/outcomes      Hands Only CPR Anytime:  -Group instruction provided by verbal instruction, video, patient participation and written materials to support subject.  Instructors co-teach with AHA video for hands only CPR.  Participants get hands on experience with mannequins.   Nutrition I class: Heart Healthy Eating:  -Group instruction provided by PowerPoint slides, verbal discussion, and written materials to support subject matter. The instructor gives an explanation and review of the Therapeutic Lifestyle Changes diet recommendations, which includes a discussion on lipid goals, dietary fat, sodium, fiber, plant stanol/sterol esters, sugar, and the components of a well-balanced, healthy diet. Flowsheet Row CARDIAC REHAB PHASE II EXERCISE from 07/07/2016 in [place]  Date  06/25/16  Educator  RD  Instruction Review Code  Not applicable [class handouts given]      Nutrition II class: Lifestyle Skills:  -Group instruction provided by PowerPoint slides, verbal discussion, and written materials to support subject matter.  The instructor gives an explanation and review of label reading, grocery shopping for heart health, heart healthy recipe modifications, and ways to make healthier choices when eating out. Flowsheet Row CARDIAC REHAB PHASE II EXERCISE from 07/07/2016 in [place]  Date  06/25/16  Educator  RD  Instruction Review Code  Not applicable [class handouts given]      Diabetes Question & Answer:  -Group instruction provided by PowerPoint slides, verbal discussion, and written materials to support subject matter. The instructor gives an explanation and review of diabetes co-morbidities, pre- and post-prandial blood glucose goals, pre-exercise blood glucose goals, signs, symptoms, and treatment of hypoglycemia and hyperglycemia, and foot care basics.   Diabetes Blitz:  -Group instruction provided by PowerPoint slides, verbal discussion, and written materials to support subject matter. The instructor gives an explanation and review of the physiology behind type 1 and type 2 diabetes, diabetes medications and rational behind using different medications,  pre- and post-prandial blood glucose recommendations and Hemoglobin A1c goals, diabetes diet, and exercise including blood glucose guidelines for exercising safely.  Flowsheet Row CARDIAC REHAB PHASE II EXERCISE from 07/07/2016 in [place]  Date  06/25/16  Educator  RD  Instruction Review Code  Not applicable [class handouts given]      Portion Distortion:  -Group instruction provided by PowerPoint slides, verbal discussion, written materials, and food models to support subject matter. The instructor gives an explanation of serving size versus portion size, changes in portions sizes over the last 20 years, and what consists of a serving from each food group.   Stress Management:  -Group instruction provided by verbal instruction, video, and written materials to support subject matter.   Instructors review role of stress in heart disease and how to cope with stress positively.     Exercising on Your Own:  -Group instruction provided by verbal instruction, power point, and written materials to support subject.  Instructors discuss benefits of exercise, components of exercise, frequency and intensity of exercise, and end points for exercise.  Also discuss use of nitroglycerin and activating EMS.  Review options of places to exercise outside of rehab.  Review guidelines for sex with heart disease.   Cardiac Drugs I:  -Group instruction provided by verbal instruction and written materials to support subject.  Instructor reviews cardiac drug classes: antiplatelets, anticoagulants, beta blockers, and statins.  Instructor discusses reasons, side effects, and lifestyle considerations for each drug class.   Cardiac Drugs II:  -Group instruction provided by verbal instruction and written materials to support subject.  Instructor reviews cardiac drug classes: angiotensin converting enzyme inhibitors (ACE-I), angiotensin II receptor blockers (ARBs), nitrates, and calcium channel blockers.  Instructor discusses reasons, side effects, and lifestyle considerations for each drug class. Flowsheet Row CARDIAC REHAB PHASE II EXERCISE from 07/07/2016 in [place]  Date  07/07/16  Instruction Review Code  2- meets goals/outcomes      Anatomy and Physiology of the Circulatory System:  -Group instruction provided by verbal instruction, video, and written materials to support subject.  Reviews functional anatomy of heart, how it relates to various diagnoses, and what role the heart plays in the overall system. Flowsheet Row CARDIAC REHAB PHASE II EXERCISE from 07/07/2016 in [place]  Date  06/30/16  Instruction Review Code  2- meets goals/outcomes      Knowledge Questionnaire Score:     Knowledge Questionnaire Score - 06/01/16  1655      Knowledge Questionnaire Score   Pre Score 20/24      Core Components/Risk Factors/Patient Goals at Admission:     Personal Goals and Risk Factors at Admission - 06/01/16 1657      Core Components/Risk Factors/Patient Goals on Admission    Weight Management Weight Loss;Yes   Intervention Weight Management: Provide education and appropriate resources to help participant work on and attain dietary goals.;Weight Management/Obesity: Establish reasonable short term and long term weight goals.   Admit Weight 143 lb 8.3 oz (65.1 kg)   Expected Outcomes Short Term: Continue to assess and modify interventions until short term weight is achieved;Long Term: Adherence to nutrition and physical activity/exercise program aimed toward attainment of established weight goal;Weight Loss: Understanding of general recommendations for a balanced deficit meal plan, which promotes 1-2 lb weight loss per week and includes a negative energy balance of [phone] kcal/d   Sedentary Yes   Intervention Provide advice, education,  support and counseling about physical activity/exercise needs.;Develop an individualized exercise prescription for aerobic and resistive training based on initial evaluation findings, risk stratification, comorbidities and participant's personal goals.   Expected Outcomes Achievement of increased cardiorespiratory fitness and enhanced flexibility, muscular endurance and strength shown through measurements of functional capacity and personal statement of participant.   Increase Strength and Stamina Yes   Intervention Provide advice, education, support and counseling about physical activity/exercise needs.;Develop an individualized exercise prescription for aerobic and resistive training based on initial evaluation findings, risk stratification, comorbidities and participant's personal goals.   Expected Outcomes Achievement of increased cardiorespiratory fitness and enhanced flexibility,  muscular endurance and strength shown through measurements of functional capacity and personal statement of participant.   Diabetes Yes   Intervention Provide education about signs/symptoms and action to take for hypo/hyperglycemia.;Provide education about proper nutrition, including hydration, and aerobic/resistive exercise prescription along with prescribed medications to achieve blood glucose in normal ranges: Fasting glucose 65-99 mg/dL   Expected Outcomes Short Term: Participant verbalizes understanding of the signs/symptoms and immediate care of hyper/hypoglycemia, proper foot care and importance of medication, aerobic/resistive exercise and nutrition plan for blood glucose control.;Long Term: Attainment of HbA1C < 7%.   Heart Failure Yes   Intervention Provide a combined exercise and nutrition program that is supplemented with education, support and counseling about heart failure. Directed toward relieving symptoms such as shortness of breath, decreased exercise tolerance, and extremity edema.   Expected Outcomes Improve functional capacity of life;Short term: Attendance in program 2-3 days a week with increased exercise capacity. Reported lower sodium intake. Reported increased fruit and vegetable intake. Reports medication compliance.;Long term: Adoption of self-care skills and reduction of barriers for early signs and symptoms recognition and intervention leading to self-care maintenance.;Short term: Daily weights obtained and reported for increase. Utilizing diuretic protocols set by physician.   Hypertension Yes   Intervention Provide education on lifestyle modifcations including regular physical activity/exercise, weight management, moderate sodium restriction and increased consumption of fresh fruit, vegetables, and low fat dairy, alcohol moderation, and smoking cessation.;Monitor prescription use compliance.   Expected Outcomes Short Term: Continued assessment and intervention until BP is <  140/33mm HG in hypertensive participants. < 130/80mm HG in hypertensive participants with diabetes, heart failure or chronic kidney disease.;Long Term: Maintenance of blood pressure at goal levels.   Personal Goal Other Yes   Personal Goal Short term:  be able to do more activities without dyspnea,  long term:  have better balance   Intervention participate in regular aerobic exercise as prescribed including strength and flexibility training   Expected Outcomes improved physical fitness level with increased balance as indicated by decreased exertional symptoms and functional capactity measurements.       Core Components/Risk Factors/Patient Goals Review:      Goals and Risk Factor Review    Row Name 06/09/16 1640 06/09/16 1645 07/07/16 1124         Core Components/Risk Factors/Patient Goals Review   Personal Goals Review Other  - Other;Increase Strength and Stamina     Review  - Currently not exercising at home, will review home exercise program with pt next week Pt stamina and SOB is improving and pt is experiencing less symptoms     Expected Outcomes  - To achieve exercise and weight loss goals Pt will continue to show improvement and stamina/SOB and develop an exercise routine        Core Components/Risk Factors/Patient Goals at Discharge (Final Review):      Goals  and Risk Factor Review - 07/07/16 1124      Core Components/Risk Factors/Patient Goals Review   Personal Goals Review Other;Increase Strength and Stamina   Review Pt stamina and SOB is improving and pt is experiencing less symptoms   Expected Outcomes Pt will continue to show improvement and stamina/SOB and develop an exercise routine      ITP Comments:     ITP Comments    Row Name 06/01/16 1651           ITP Comments Medical Director:  Dr. Fransico Him           Comments:  Pt is making expected progress toward personal goals after completing 12 sessions.  Pt seen this week in the CHF clinic, plan to  increase apixaban.  Pt has upcoming trip [place] with her granddaughter.  Pt is excited that she feels able to go on this trip.  Psychosocial Assessment -  No needs identified, no interventions needed.  Continue to monitor. Recommend continued exercise and life style modification education including  stress management and relaxation techniques to decrease cardiac risk profile. Cherre Huger, BSN

## 2016-07-09 ENCOUNTER — Other Ambulatory Visit (HOSPITAL_COMMUNITY): Payer: Self-pay | Admitting: *Deleted

## 2016-07-09 ENCOUNTER — Encounter (HOSPITAL_COMMUNITY)
Admission: RE | Admit: 2016-07-09 | Discharge: 2016-07-09 | Disposition: A | Discharge status: Home / Self Care | Payer: Medicare Other | Source: Ambulatory Visit | Attending: Cardiology | Admitting: Cardiology

## 2016-07-09 DIAGNOSIS — I5022 Chronic systolic (congestive) heart failure: Secondary | ICD-10-CM | POA: Diagnosis not present

## 2016-07-09 MED ORDER — GLUCOSE BLOOD VI STRP: ORAL_STRIP | 0 refills | Status: DC

## 2016-07-12 ENCOUNTER — Encounter (HOSPITAL_COMMUNITY)
Admission: RE | Admit: 2016-07-12 | Discharge: 2016-07-12 | Disposition: A | Discharge status: Home / Self Care | Payer: Medicare Other | Source: Ambulatory Visit | Attending: Cardiology | Admitting: Cardiology

## 2016-07-12 DIAGNOSIS — I5022 Chronic systolic (congestive) heart failure: Secondary | ICD-10-CM

## 2016-07-12 LAB — GLUCOSE, CAPILLARY: GLUCOSE-CAPILLARY: 126 mg/dL — AB (ref 65–99)

## 2016-07-13 ENCOUNTER — Encounter (HOSPITAL_COMMUNITY): Payer: Self-pay

## 2016-07-14 ENCOUNTER — Other Ambulatory Visit (HOSPITAL_COMMUNITY): Payer: Self-pay | Admitting: Pharmacist

## 2016-07-14 ENCOUNTER — Encounter: Payer: Self-pay | Admitting: Cardiology

## 2016-07-14 ENCOUNTER — Encounter (HOSPITAL_COMMUNITY)
Admission: RE | Admit: 2016-07-14 | Discharge: 2016-07-14 | Disposition: A | Discharge status: Home / Self Care | Payer: Medicare Other | Source: Ambulatory Visit | Attending: Cardiology | Admitting: Cardiology

## 2016-07-14 DIAGNOSIS — I5022 Chronic systolic (congestive) heart failure: Secondary | ICD-10-CM | POA: Diagnosis not present

## 2016-07-14 LAB — GLUCOSE, CAPILLARY: GLUCOSE-CAPILLARY: 128 mg/dL — AB (ref 65–99)

## 2016-07-14 MED ORDER — GLUCOSE BLOOD VI STRP: ORAL_STRIP | 0 refills | Status: DC

## 2016-07-15 ENCOUNTER — Other Ambulatory Visit (HOSPITAL_COMMUNITY): Payer: Self-pay | Admitting: Cardiology

## 2016-07-15 ENCOUNTER — Encounter: Payer: Self-pay | Admitting: Cardiology

## 2016-07-15 ENCOUNTER — Encounter (HOSPITAL_COMMUNITY): Payer: Self-pay

## 2016-07-16 ENCOUNTER — Encounter (HOSPITAL_COMMUNITY)
Admission: RE | Admit: 2016-07-16 | Discharge: 2016-07-16 | Disposition: A | Discharge status: Home / Self Care | Payer: Medicare Other | Source: Ambulatory Visit | Attending: Cardiology | Admitting: Cardiology

## 2016-07-16 DIAGNOSIS — I5022 Chronic systolic (congestive) heart failure: Secondary | ICD-10-CM

## 2016-07-16 LAB — GLUCOSE, CAPILLARY: GLUCOSE-CAPILLARY: 105 mg/dL — AB (ref 65–99)

## 2016-07-19 ENCOUNTER — Other Ambulatory Visit (HOSPITAL_COMMUNITY): Payer: Self-pay | Admitting: *Deleted

## 2016-07-19 ENCOUNTER — Other Ambulatory Visit (HOSPITAL_COMMUNITY): Payer: Self-pay

## 2016-07-19 ENCOUNTER — Encounter (HOSPITAL_COMMUNITY)
Admission: RE | Admit: 2016-07-19 | Discharge: 2016-07-19 | Disposition: A | Discharge status: Home / Self Care | Payer: Medicare Other | Source: Ambulatory Visit | Attending: Cardiology | Admitting: Cardiology

## 2016-07-19 DIAGNOSIS — I5022 Chronic systolic (congestive) heart failure: Secondary | ICD-10-CM | POA: Diagnosis not present

## 2016-07-19 LAB — GLUCOSE, CAPILLARY: GLUCOSE-CAPILLARY: 145 mg/dL — AB (ref 65–99)

## 2016-07-19 MED ORDER — GLUCOSE BLOOD VI STRP: ORAL_STRIP | 0 refills | Status: DC

## 2016-07-19 NOTE — Telephone Encounter (Signed)
Refilled glucose strips with correct I10 code (E11.9) per [facility] request

## 2016-07-21 ENCOUNTER — Encounter (HOSPITAL_COMMUNITY)
Admission: RE | Admit: 2016-07-21 | Discharge: 2016-07-21 | Disposition: A | Discharge status: Home / Self Care | Payer: Medicare Other | Source: Ambulatory Visit | Attending: Cardiology | Admitting: Cardiology

## 2016-07-21 DIAGNOSIS — I5022 Chronic systolic (congestive) heart failure: Secondary | ICD-10-CM | POA: Diagnosis not present

## 2016-07-26 ENCOUNTER — Encounter (HOSPITAL_COMMUNITY)
Admission: RE | Admit: 2016-07-26 | Discharge: 2016-07-26 | Disposition: A | Discharge status: Home / Self Care | Payer: Medicare Other | Source: Ambulatory Visit | Attending: Cardiology | Admitting: Cardiology

## 2016-07-26 DIAGNOSIS — I5022 Chronic systolic (congestive) heart failure: Secondary | ICD-10-CM

## 2016-07-28 ENCOUNTER — Encounter (HOSPITAL_COMMUNITY)
Admission: RE | Admit: 2016-07-28 | Discharge: 2016-07-28 | Disposition: A | Discharge status: Home / Self Care | Payer: Medicare Other | Source: Ambulatory Visit | Attending: Cardiology | Admitting: Cardiology

## 2016-07-28 DIAGNOSIS — I5022 Chronic systolic (congestive) heart failure: Secondary | ICD-10-CM

## 2016-07-28 LAB — GLUCOSE, CAPILLARY: Glucose-Capillary: 106 mg/dL — ABNORMAL HIGH (ref 65–99)

## 2016-07-30 ENCOUNTER — Encounter (HOSPITAL_COMMUNITY)
Admission: RE | Admit: 2016-07-30 | Discharge: 2016-07-30 | Disposition: A | Discharge status: Home / Self Care | Payer: Medicare Other | Source: Ambulatory Visit | Attending: Cardiology | Admitting: Cardiology

## 2016-07-30 ENCOUNTER — Encounter: Payer: Self-pay | Admitting: Cardiology

## 2016-07-30 DIAGNOSIS — I5022 Chronic systolic (congestive) heart failure: Secondary | ICD-10-CM | POA: Diagnosis not present

## 2016-08-02 ENCOUNTER — Encounter (HOSPITAL_COMMUNITY)
Admission: RE | Admit: 2016-08-02 | Discharge: 2016-08-02 | Disposition: A | Discharge status: Home / Self Care | Payer: Medicare Other | Source: Ambulatory Visit | Attending: Cardiology | Admitting: Cardiology

## 2016-08-02 DIAGNOSIS — I5022 Chronic systolic (congestive) heart failure: Secondary | ICD-10-CM

## 2016-08-02 LAB — GLUCOSE, CAPILLARY: Glucose-Capillary: 86 mg/dL (ref 65–99)

## 2016-08-04 ENCOUNTER — Encounter (HOSPITAL_COMMUNITY)
Admission: RE | Admit: 2016-08-04 | Discharge: 2016-08-04 | Disposition: A | Discharge status: Home / Self Care | Payer: Medicare Other | Source: Ambulatory Visit | Attending: Cardiology | Admitting: Cardiology

## 2016-08-04 DIAGNOSIS — I5022 Chronic systolic (congestive) heart failure: Secondary | ICD-10-CM | POA: Diagnosis not present

## 2016-08-04 LAB — GLUCOSE, CAPILLARY: Glucose-Capillary: 93 mg/dL (ref 65–99)

## 2016-08-05 ENCOUNTER — Telehealth: Payer: Self-pay | Admitting: *Deleted

## 2016-08-05 NOTE — Telephone Encounter (Signed)
Spoke to the patient and she verbalized understanding 

## 2016-08-05 NOTE — Progress Notes (Signed)
Cardiac Individual Treatment Plan  Patient Details  Name: Kim Mullins MRN: [id number] Date of Birth: 11-18-1929 Referring Provider:   Flowsheet Row CARDIAC REHAB PHASE II ORIENTATION from 06/01/2016 in [place]  Referring Provider  [name], MD      Initial Encounter Date:  [place] PHASE II ORIENTATION from 06/01/2016 in [place]  Date  06/01/16  Referring Provider  [name], MD      Visit Diagnosis: Chronic systolic congestive heart failure [facility])  Patient's Home Medications on Admission:  Current Outpatient Prescriptions:  .  Acetaminophen (TYLENOL) 325 MG CAPS, Take 325 mg by mouth daily as needed (for shoulder pain). , Disp: , Rfl:  .  apixaban (ELIQUIS) 5 MG TABS tablet, Take 1 tablet (5 mg total) by mouth 2 (two) times daily., Disp: 60 tablet, Rfl: 6 .  Ascorbic Acid (VITAMIN C PO), Take 1 tablet by mouth daily. Reported on 12/19/2015, Disp: , Rfl:  .  Bisacodyl (LAXATIVE PO), Take 1 tablet by mouth daily as needed (for constipation). , Disp: , Rfl:  .  CALCIUM PO, Take 1 tablet by mouth daily., Disp: , Rfl:  .  carvedilol (COREG) 3.125 MG tablet, TAKE 1 TABLET BY MOUTH TWICE DAILY WITH A MEAL, Disp: 60 tablet, Rfl: 0 .  diclofenac sodium (VOLTAREN) 1 % GEL, Apply 2 g topically 3 (three) times daily as needed (pain, inflammation)., Disp: 100 g, Rfl: 0 .  diphenhydramine-acetaminophen (TYLENOL PM) 25-500 MG TABS tablet, Take 1 tablet by mouth at bedtime as needed (sleep). , Disp: , Rfl:  .  dorzolamide (TRUSOPT) 2 % ophthalmic solution, Place 1 drop into both eyes 2 (two) times daily. , Disp: , Rfl: 11 .  furosemide (LASIX) 40 MG tablet, Take 1 tablet (40 mg total) by mouth 2 (two) times daily., Disp: 180 tablet, Rfl: 3 .  glucose blood test strip, Use to test blood sugar twice daily ([employer] Smartview), Disp: 100 each, Rfl: 0 .  lisinopril (PRINIVIL,ZESTRIL) 2.5 MG tablet,  Take 1 tablet (2.5 mg total) by mouth every evening., Disp: 30 tablet, Rfl: 3 .  spironolactone (ALDACTONE) 25 MG tablet, Take 0.5 tablets (12.5 mg total) by mouth at bedtime., Disp: 15 tablet, Rfl: 6 .  timolol (TIMOPTIC) 0.5 % ophthalmic solution, Place 1 drop into both eyes daily. , Disp: , Rfl: 11  Past Medical History: Past Medical History:  Diagnosis Date  . Anemia    occassionally  . Arthritis    all over.  . Asthma    Allergixc reaction to cats.  . Atrial fibrillation ([place])   . Bowel obstruction   . Chronic systolic CHF (congestive heart failure) (HCC)    EF 30-35% by cath, echo 2016 EF 50%  . Complete heart block (HCC)    a. s/p MDT CRTP pacemaker  . DCM (dilated cardiomyopathy) ([place]) 08/16/2014   normal coronary arteries on cath with EF 30-45%.  EF now 50% by echo 11/2014  . Diabetes mellitus 2006   Diet and exercise controlled.  [place] GERD (gastroesophageal reflux disease)    occ  . Hypertension   . Left tibial fracture 2007  . OSA (obstructive sleep apnea) 10/23/2014   Moderate with AHI 21/hr  . Osteoarthritis of right shoulder region 06/26/2013  . Stroke [facility])     Tobacco Use: History  Smoking Status  . Former Smoker  . Packs/day: 1.00  . Years: 45.00  . Types: Cigarettes  . Quit date:  07/24/1978  Smokeless Tobacco  . Never Used    Labs: Recent Review Flowsheet Data    Labs for ITP Cardiac and Pulmonary Rehab Latest Ref Rng & Units 08/29/2014 06/18/2015 06/19/2015 04/12/2016 04/13/2016   Cholestrol 0 - 200 mg/dL - - 110 - 90   LDLCALC 0 - 99 mg/dL - - 50 - 46   HDL >40 mg/dL - - 49 - 30(L)   Trlycerides <150 mg/dL - - 55 - 69   Hemoglobin A1c 4.8 - 5.6 % - - 5.8(H) - 5.7(H)   PHART 7.350 - 7.450 7.376 - - - -   PCO2ART 35.0 - 45.0 mmHg 40.0 - - - -   HCO3 20.0 - 24.0 mEq/L 23.4 - - - -   TCO2 0 - 100 mmol/L 25 22 - 20 -   ACIDBASEDEF 0.0 - 2.0 mmol/L 2.0 - - - -   O2SAT % 98.0 - - - -      Capillary Blood Glucose: Lab Results  Component Value Date    GLUCAP 93 08/04/2016   GLUCAP 86 08/02/2016   GLUCAP 106 (H) 07/28/2016   GLUCAP 145 (H) 07/19/2016   GLUCAP 105 (H) 07/16/2016     Exercise Target Goals:    Exercise Program Goal: Individual exercise prescription set with THRR, safety & activity barriers. Participant demonstrates ability to understand and report RPE using BORG scale, to self-measure pulse accurately, and to acknowledge the importance of the exercise prescription.  Exercise Prescription Goal: Starting with aerobic activity 30 plus minutes a day, 3 days per week for initial exercise prescription. Provide home exercise prescription and guidelines that participant acknowledges understanding prior to discharge.  Activity Barriers & Risk Stratification:     Activity Barriers & Cardiac Risk Stratification - 06/01/16 1653      Activity Barriers & Cardiac Risk Stratification   Cardiac Risk Stratification Moderate      6 Minute Walk:     6 Minute Walk    Row Name 06/01/16 1655         6 Minute Walk   Phase Initial     Distance 465 feet     Walk Time 6 minutes     # of Rest Breaks 1     MPH 1.49     METS 0.39     RPE 13     VO2 Peak 1.38     Symptoms Yes (comment)     Comments fatigue, rest break at 3:03, restart 5:30     Resting HR 64 bpm     Resting BP 103/64     Max Ex. HR 104 bpm     Max Ex. BP 104/60     2 Minute Post BP 108/60        Initial Exercise Prescription:     Initial Exercise Prescription - 06/02/16 0800      Date of Initial Exercise RX and Referring Provider   Date 06/01/16   Referring Provider [name], MD     Bike   Level 0.5   Minutes 10   METs 2.26     NuStep   Level 1   Minutes 10   METs 2     Track   Laps 5   Minutes 10   METs 1.84     Prescription Details   Frequency (times per week) 3   Duration Progress to 30 minutes of continuous aerobic without signs/symptoms of physical distress     Intensity   THRR 40-80% of  Max Heartrate 55-110   Ratings  of Perceived Exertion 11-13   Perceived Dyspnea 0-4     Progression   Progression Continue to progress workloads to maintain intensity without signs/symptoms of physical distress.     Resistance Training   Training Prescription Yes   Weight 1lb   Reps 10-12      Perform Capillary Blood Glucose checks as needed.  Exercise Prescription Changes:     Exercise Prescription Changes    Row Name 06/09/16 1600 06/23/16 1600 07/07/16 1300 08/03/16 1100       Response to Exercise   Blood Pressure (Admit) 111/66 114/76 94/68 110/80    Blood Pressure (Exercise) 106/60 98/60 94/60   110/60 rchk 110/70  110/60 rchk    Blood Pressure (Exit) 104/60 68/62 96/68  96/68    Heart Rate (Admit) 83 bpm 67 bpm 99 bpm 86 bpm    Heart Rate (Exercise) 103 bpm 96 bpm 102 bpm 97 bpm    Heart Rate (Exit) 71 bpm 67 bpm 96 bpm 71 bpm    Rating of Perceived Exertion (Exercise) 12 12 15 15     Symptoms  - none none none    Comments  - Reviewed home exercise guidelines on 06/14/16. Reviewed home exercise guidelines on 06/14/16. Reviewed home exercise guidelines on 06/14/16.    Duration  - Progress to 30 minutes of continuous aerobic without signs/symptoms of physical distress Progress to 30 minutes of continuous aerobic without signs/symptoms of physical distress Progress to 30 minutes of continuous aerobic without signs/symptoms of physical distress    Intensity  - THRR unchanged THRR unchanged THRR unchanged      Progression   Progression  - Continue to progress workloads to maintain intensity without signs/symptoms of physical distress. Continue to progress workloads to maintain intensity without signs/symptoms of physical distress. Continue to progress workloads to maintain intensity without signs/symptoms of physical distress.    Average METs 2.21 1.8 1.9 1.7      Resistance Training   Training Prescription Yes Yes Yes Yes    Weight 1lb 1lb 1lb 2lbs    Reps 10-12 10-12 10-12 10-12      Interval Training    Interval Training  - No No No      Treadmill   MPH 2 -  -  -    Grade 0 -  -  -    Minutes 10 -  -  -    METs 2.57 -  -  -      Bike   Level -  -  -  -    Minutes -  -  -  -    METs -  -  -  -      Recumbant Bike   Level  -  - 1.3 1.6    Minutes  -  - 10 10    METs  -  - 1.3 1.7      NuStep   Level 1 1 2 2     Minutes 10 20 20 20     METs 1.8 1.9 1.9 1.8      Arm Ergometer   Level 1 -  -  -    Minutes 10 -  -  -    METs 2.25 -  -  -      Track   Laps - 4 4 3     Minutes -  -  -  -    METs - 1.7 1.7 1.52  Home Exercise Plan   Plans to continue exercise at  - [place] 2 additional days to program exercise sessions. Add 2 additional days to program exercise sessions. Add 2 additional days to program exercise sessions.       Exercise Comments:     Exercise Comments    Row Name 06/09/16 1639 06/14/16 1410 06/18/16 1240 07/07/16 1702 07/30/16 1632   Exercise Comments Pt is off to a good start with exercise prescription Reviewed home exercise guidelines with participant. She plans to walk 10 minutes, 3x's/day, 1-2 days per week. Particiant has a recumbent bike which she plans to use for her home exercise. Reviewed METs and goals. Pt plans to continue with HEP (riding stationary recumbent bike)  Reviewed METs and goals. Pt is tolerating exercise fairly well; will continue to monitor      Discharge Exercise Prescription (Final Exercise Prescription Changes):     Exercise Prescription Changes - 08/03/16 1100      Response to Exercise   Blood Pressure (Admit) 110/80   Blood Pressure (Exercise) 110/70  110/60 rchk   Blood Pressure (Exit) 96/68   Heart Rate (Admit) 86 bpm   Heart Rate (Exercise) 97 bpm   Heart Rate (Exit) 71 bpm   Rating of Perceived Exertion (Exercise) 15   Symptoms none   Comments Reviewed home exercise guidelines on 06/14/16.   Duration Progress to 30 minutes of continuous aerobic without signs/symptoms of physical  distress   Intensity THRR unchanged     Progression   Progression Continue to progress workloads to maintain intensity without signs/symptoms of physical distress.   Average METs 1.7     Resistance Training   Training Prescription Yes   Weight 2lbs   Reps 10-12     Interval Training   Interval Training No     Recumbant Bike   Level 1.6   Minutes 10   METs 1.7     NuStep   Level 2   Minutes 20   METs 1.8     Track   Laps 3   METs 1.52     Home Exercise Plan   Plans to continue exercise at Home   Frequency Add 2 additional days to program exercise sessions.      Nutrition:  Target Goals: Understanding of nutrition guidelines, daily intake of sodium 1500mg , cholesterol 200mg , calories 30% from fat and 7% or less from saturated fats, daily to have 5 or more servings of fruits and vegetables.  Biometrics:     Pre Biometrics - 06/02/16 0817      Pre Biometrics   Height 5\' 3"  (1.6 m)   Weight 165 lb 12.6 oz (75.2 kg)   Waist Circumference 36.25 inches   Hip Circumference 44.75 inches   Waist to Hip Ratio 0.81 %   BMI (Calculated) 29.4   Triceps Skinfold 17 mm   % Body Fat 38.9 %   Grip Strength 24 kg   Flexibility 7.5 in   Single Leg Stand 0.59 seconds       Nutrition Therapy Plan and Nutrition Goals:     Nutrition Therapy & Goals - 06/25/16 1230      Nutrition Therapy   Diet Low Sodium     Personal Nutrition Goals   Personal Goal #1 1-2 lb wt loss/week to a goal wt loss 6-15 lb at graduation from [place], educate and counsel regarding  individualized specific dietary modifications aiming towards targeted core components such as weight, hypertension, lipid management, diabetes, heart failure and other comorbidities.   Expected Outcomes Short Term Goal: Understand basic principles of dietary content, such as calories, fat, sodium, cholesterol and nutrients.;Long Term Goal: Adherence to prescribed  nutrition plan.      Nutrition Discharge: Nutrition Scores:     Nutrition Assessments - 06/25/16 1230      MEDFICTS Scores   Pre Score 63      Nutrition Goals Re-Evaluation:   Psychosocial: Target Goals: Acknowledge presence or absence of depression, maximize coping skills, provide positive support system. Participant is able to verbalize types and ability to use techniques and skills needed for reducing stress and depression.  Initial Review & Psychosocial Screening:     Initial Psych Review & Screening - 06/01/16 [place]? Yes     Barriers   Psychosocial barriers to participate in program There are no identifiable barriers or psychosocial needs.     Screening Interventions   Interventions Encouraged to exercise      Quality of Life Scores:     Quality of Life - 06/01/16 1704      Quality of Life Scores   Health/Function Pre 15.61 %   Socioeconomic Pre 25.64 %   Psych/Spiritual Pre 29.14 %   Family Pre 22.5 %   GLOBAL Pre 21.63 %      PHQ-9: Recent Review Flowsheet Data    There is no flowsheet data to display.      Psychosocial Evaluation and Intervention:     Psychosocial Evaluation - 08/05/16 1625      Psychosocial Evaluation & Interventions   Interventions Encouraged to exercise with the program and follow exercise prescription   Comments Pt denies any needs, pt has supportive family, pt generally feels positive about her future, no needs identified.      Psychosocial Re-Evaluation:     Psychosocial Re-Evaluation    [place] Name 07/08/16 1550 08/05/16 1625           Psychosocial Re-Evaluation   Interventions Encouraged to attend Cardiac Rehabilitation for the exercise Encouraged to attend Cardiac Rehabilitation for the exercise;Relaxation education;Stress management education      Comments  - looking forward to her trip to [place] later this month.      Continued Psychosocial Services Needed  - No          Vocational Rehabilitation: Provide vocational rehab assistance to qualifying candidates.   Vocational Rehab Evaluation & Intervention:     Vocational Rehab - 06/01/16 1655      Initial Vocational Rehab Evaluation & Intervention   Assessment shows need for Vocational Rehabilitation No      Education: Education Goals: Education classes will be provided on a weekly basis, covering required topics. Participant will state understanding/return demonstration of topics presented.  Learning Barriers/Preferences:     Learning Barriers/Preferences - 06/01/16 1654      Learning Barriers/Preferences   Learning Barriers Sight   Learning Preferences Written Material;Verbal Instruction      Education Topics: Count Your Pulse:  -Group instruction provided by verbal instruction, demonstration, patient participation and written materials to support subject.  Instructors address importance of being able to find your pulse and how to count your pulse when at home without a heart monitor.  Patients get hands on experience counting their pulse with staff help and individually. Flowsheet Row CARDIAC REHAB PHASE II EXERCISE from 08/04/2016  in [place]  Date  07/02/16  Educator  Arville Go RIon RN  Instruction Review Code  2- meets goals/outcomes      Heart Attack, Angina, and Risk Factor Modification:  -Group instruction provided by verbal instruction, video, and written materials to support subject.  Instructors address signs and symptoms of angina and heart attacks.    Also discuss risk factors for heart disease and how to make changes to improve heart health risk factors. Flowsheet Row CARDIAC REHAB PHASE II EXERCISE from 08/04/2016 in [place]  Date  06/16/16  Instruction Review Code  2- meets goals/outcomes      Functional Fitness:  -Group instruction provided by verbal instruction, demonstration, patient participation,  and written materials to support subject.  Instructors address safety measures for doing things around the house.  Discuss how to get up and down off the floor, how to pick things up properly, how to safely get out of a chair without assistance, and balance training. Flowsheet Row CARDIAC REHAB PHASE II EXERCISE from 08/04/2016 in [place]  Date  07/09/16  Educator  Seward Carol  Instruction Review Code  2- meets goals/outcomes      Meditation and Mindfulness:  -Group instruction provided by verbal instruction, patient participation, and written materials to support subject.  Instructor addresses importance of mindfulness and meditation practice to help reduce stress and improve awareness.  Instructor also leads participants through a meditation exercise.  Flowsheet Row CARDIAC REHAB PHASE II EXERCISE from 08/04/2016 in [place]  Date  06/23/16  Educator  Jeanella Craze  Instruction Review Code  2- meets goals/outcomes      Stretching for Flexibility and Mobility:  -Group instruction provided by verbal instruction, patient participation, and written materials to support subject.  Instructors lead participants through series of stretches that are designed to increase flexibility thus improving mobility.  These stretches are additional exercise for major muscle groups that are typically performed during regular warm up and cool down. Flowsheet Row CARDIAC REHAB PHASE II EXERCISE from 08/04/2016 in [place]  Date  08/04/16  Educator  Seward Carol  Instruction Review Code  2- meets goals/outcomes      Hands Only CPR Anytime:  -Group instruction provided by verbal instruction, video, patient participation and written materials to support subject.  Instructors co-teach with AHA video for hands only CPR.  Participants get hands on experience with mannequins.   Nutrition I class: Heart Healthy  Eating:  -Group instruction provided by PowerPoint slides, verbal discussion, and written materials to support subject matter. The instructor gives an explanation and review of the Therapeutic Lifestyle Changes diet recommendations, which includes a discussion on lipid goals, dietary fat, sodium, fiber, plant stanol/sterol esters, sugar, and the components of a well-balanced, healthy diet. Flowsheet Row CARDIAC REHAB PHASE II EXERCISE from 08/04/2016 in [place]  Date  06/25/16  Educator  RD  Instruction Review Code  Not applicable [class handouts given]      Nutrition II class: Lifestyle Skills:  -Group instruction provided by PowerPoint slides, verbal discussion, and written materials to support subject matter. The instructor gives an explanation and review of label reading, grocery shopping for heart health, heart healthy recipe modifications, and ways to make healthier choices when eating out. Flowsheet Row CARDIAC REHAB PHASE II EXERCISE from 08/04/2016 in [place]  Date  06/25/16  Educator  RD  Instruction Review Code  Not applicable [class handouts given]      Diabetes Question & Answer:  -Group instruction provided by PowerPoint slides, verbal discussion, and written materials to support subject matter. The instructor gives an explanation and review of diabetes co-morbidities, pre- and post-prandial blood glucose goals, pre-exercise blood glucose goals, signs, symptoms, and treatment of hypoglycemia and hyperglycemia, and foot care basics.   Diabetes Blitz:  -Group instruction provided by PowerPoint slides, verbal discussion, and written materials to support subject matter. The instructor gives an explanation and review of the physiology behind type 1 and type 2 diabetes, diabetes medications and rational behind using different medications, pre- and post-prandial blood glucose recommendations and Hemoglobin A1c goals,  diabetes diet, and exercise including blood glucose guidelines for exercising safely.  Flowsheet Row CARDIAC REHAB PHASE II EXERCISE from 08/04/2016 in [place]  Date  06/25/16  Educator  RD  Instruction Review Code  Not applicable [class handouts given]      Portion Distortion:  -Group instruction provided by PowerPoint slides, verbal discussion, written materials, and food models to support subject matter. The instructor gives an explanation of serving size versus portion size, changes in portions sizes over the last 20 years, and what consists of a serving from each food group. Flowsheet Row CARDIAC REHAB PHASE II EXERCISE from 08/04/2016 in [place]  Date  07/28/16 [Holiday Eating Survival Tips]  Educator  RD  Instruction Review Code  2- meets goals/outcomes      Stress Management:  -Group instruction provided by verbal instruction, video, and written materials to support subject matter.  Instructors review role of stress in heart disease and how to cope with stress positively.     Exercising on Your Own:  -Group instruction provided by verbal instruction, power point, and written materials to support subject.  Instructors discuss benefits of exercise, components of exercise, frequency and intensity of exercise, and end points for exercise.  Also discuss use of nitroglycerin and activating EMS.  Review options of places to exercise outside of rehab.  Review guidelines for sex with heart disease. Flowsheet Row CARDIAC REHAB PHASE II EXERCISE from 08/04/2016 in [place]  Date  07/16/16  Instruction Review Code  2- meets goals/outcomes      Cardiac Drugs I:  -Group instruction provided by verbal instruction and written materials to support subject.  Instructor reviews cardiac drug classes: antiplatelets, anticoagulants, beta blockers, and statins.  Instructor discusses reasons, side  effects, and lifestyle considerations for each drug class.   Cardiac Drugs II:  -Group instruction provided by verbal instruction and written materials to support subject.  Instructor reviews cardiac drug classes: angiotensin converting enzyme inhibitors (ACE-I), angiotensin II receptor blockers (ARBs), nitrates, and calcium channel blockers.  Instructor discusses reasons, side effects, and lifestyle considerations for each drug class. Flowsheet Row CARDIAC REHAB PHASE II EXERCISE from 08/04/2016 in [place]  Date  07/07/16  Instruction Review Code  2- meets goals/outcomes      Anatomy and Physiology of the Circulatory System:  -Group instruction provided by verbal instruction, video, and written materials to support subject.  Reviews functional anatomy of heart, how it relates to various diagnoses, and what role the heart plays in the overall system. Flowsheet Row CARDIAC REHAB PHASE II EXERCISE from 08/04/2016 in [place]  Date  06/30/16  Instruction Review Code  2- meets  goals/outcomes      Knowledge Questionnaire Score:     Knowledge Questionnaire Score - 06/01/16 1655      Knowledge Questionnaire Score   Pre Score 20/24      Core Components/Risk Factors/Patient Goals at Admission:     Personal Goals and Risk Factors at Admission - 06/01/16 1657      Core Components/Risk Factors/Patient Goals on Admission    Weight Management Weight Loss;Yes   Intervention Weight Management: Provide education and appropriate resources to help participant work on and attain dietary goals.;Weight Management/Obesity: Establish reasonable short term and long term weight goals.   Admit Weight 143 lb 8.3 oz (65.1 kg)   Expected Outcomes Short Term: Continue to assess and modify interventions until short term weight is achieved;Long Term: Adherence to nutrition and physical activity/exercise program aimed toward attainment of  established weight goal;Weight Loss: Understanding of general recommendations for a balanced deficit meal plan, which promotes 1-2 lb weight loss per week and includes a negative energy balance of [phone] kcal/d   Sedentary Yes   Intervention Provide advice, education, support and counseling about physical activity/exercise needs.;Develop an individualized exercise prescription for aerobic and resistive training based on initial evaluation findings, risk stratification, comorbidities and participant's personal goals.   Expected Outcomes Achievement of increased cardiorespiratory fitness and enhanced flexibility, muscular endurance and strength shown through measurements of functional capacity and personal statement of participant.   Increase Strength and Stamina Yes   Intervention Provide advice, education, support and counseling about physical activity/exercise needs.;Develop an individualized exercise prescription for aerobic and resistive training based on initial evaluation findings, risk stratification, comorbidities and participant's personal goals.   Expected Outcomes Achievement of increased cardiorespiratory fitness and enhanced flexibility, muscular endurance and strength shown through measurements of functional capacity and personal statement of participant.   Diabetes Yes   Intervention Provide education about signs/symptoms and action to take for hypo/hyperglycemia.;Provide education about proper nutrition, including hydration, and aerobic/resistive exercise prescription along with prescribed medications to achieve blood glucose in normal ranges: Fasting glucose 65-99 mg/dL   Expected Outcomes Short Term: Participant verbalizes understanding of the signs/symptoms and immediate care of hyper/hypoglycemia, proper foot care and importance of medication, aerobic/resistive exercise and nutrition plan for blood glucose control.;Long Term: Attainment of HbA1C < 7%.   Heart Failure Yes    Intervention Provide a combined exercise and nutrition program that is supplemented with education, support and counseling about heart failure. Directed toward relieving symptoms such as shortness of breath, decreased exercise tolerance, and extremity edema.   Expected Outcomes Improve functional capacity of life;Short term: Attendance in program 2-3 days a week with increased exercise capacity. Reported lower sodium intake. Reported increased fruit and vegetable intake. Reports medication compliance.;Long term: Adoption of self-care skills and reduction of barriers for early signs and symptoms recognition and intervention leading to self-care maintenance.;Short term: Daily weights obtained and reported for increase. Utilizing diuretic protocols set by physician.   Hypertension Yes   Intervention Provide education on lifestyle modifcations including regular physical activity/exercise, weight management, moderate sodium restriction and increased consumption of fresh fruit, vegetables, and low fat dairy, alcohol moderation, and smoking cessation.;Monitor prescription use compliance.   Expected Outcomes Short Term: Continued assessment and intervention until BP is < 140/25mm HG in hypertensive participants. < 130/30mm HG in hypertensive participants with diabetes, heart failure or chronic kidney disease.;Long Term: Maintenance of blood pressure at goal levels.   Personal Goal Other Yes   Personal Goal Short term:  be able to do more  activities without dyspnea,  long term:  have better balance   Intervention participate in regular aerobic exercise as prescribed including strength and flexibility training   Expected Outcomes improved physical fitness level with increased balance as indicated by decreased exertional symptoms and functional capactity measurements.       Core Components/Risk Factors/Patient Goals Review:      Goals and Risk Factor Review    Row Name 06/09/16 1640 06/09/16 1645 07/07/16 1124  07/30/16 1632       Core Components/Risk Factors/Patient Goals Review   Personal Goals Review Other  - Other;Increase Strength and Stamina Increase Strength and Stamina    Review  - Currently not exercising at home, will review home exercise program with pt next week Pt stamina and SOB is improving and pt is experiencing less symptoms Pt was able to walk at [facility] for 15 minutes before shopping. Pt felt good about not using a scooter to get around.    Expected Outcomes  - To achieve exercise and weight loss goals Pt will continue to show improvement and stamina/SOB and develop an exercise routine Pt will continue to improve in strength and functional mobility.       Core Components/Risk Factors/Patient Goals at Discharge (Final Review):      Goals and Risk Factor Review - 07/30/16 1632      Core Components/Risk Factors/Patient Goals Review   Personal Goals Review Increase Strength and Stamina   Review Pt was able to walk at [facility] for 15 minutes before shopping. Pt felt good about not using a scooter to get around.   Expected Outcomes Pt will continue to improve in strength and functional mobility.      ITP Comments:     ITP Comments    Row Name 06/01/16 1651           ITP Comments Medical Director:  Dr. Fransico Him           Comments:  Pt is making expected progress toward personal goals after completing 23 sessions. Pt is looking forward to her trip to [place] over the Christmas holiday. Plan to change pt order in eqipment so she can walk first and not tire out on the two stepper and recumbent bike.  Pt feels she can do more if she go ahead and walk first..Repeat Psychosocial Assessment: Pt with supportive family, denies any Psychosocial needs or interventions at this time. Recommend continued exercise and life style modification education including  stress management and relaxation techniques to decrease cardiac risk profile. Cherre Huger, BSN

## 2016-08-06 ENCOUNTER — Encounter (HOSPITAL_COMMUNITY)
Admission: RE | Admit: 2016-08-06 | Discharge: 2016-08-06 | Disposition: A | Discharge status: Home / Self Care | Payer: Medicare Other | Source: Ambulatory Visit | Attending: Cardiology | Admitting: Cardiology

## 2016-08-06 DIAGNOSIS — I5022 Chronic systolic (congestive) heart failure: Secondary | ICD-10-CM

## 2016-08-09 ENCOUNTER — Encounter (HOSPITAL_COMMUNITY)
Admission: RE | Admit: 2016-08-09 | Discharge: 2016-08-09 | Disposition: A | Discharge status: Home / Self Care | Payer: Medicare Other | Source: Ambulatory Visit | Attending: Cardiology | Admitting: Cardiology

## 2016-08-09 DIAGNOSIS — I5022 Chronic systolic (congestive) heart failure: Secondary | ICD-10-CM | POA: Diagnosis not present

## 2016-08-11 ENCOUNTER — Encounter (HOSPITAL_COMMUNITY)
Admission: RE | Admit: 2016-08-11 | Discharge: 2016-08-11 | Disposition: A | Discharge status: Home / Self Care | Payer: Medicare Other | Source: Ambulatory Visit | Attending: Cardiology | Admitting: Cardiology

## 2016-08-11 DIAGNOSIS — I5022 Chronic systolic (congestive) heart failure: Secondary | ICD-10-CM | POA: Diagnosis not present

## 2016-08-11 LAB — GLUCOSE, CAPILLARY: GLUCOSE-CAPILLARY: 125 mg/dL — AB (ref 65–99)

## 2016-08-13 ENCOUNTER — Encounter (HOSPITAL_COMMUNITY)
Admission: RE | Admit: 2016-08-13 | Discharge: 2016-08-13 | Disposition: A | Discharge status: Home / Self Care | Payer: Medicare Other | Source: Ambulatory Visit | Attending: Cardiology | Admitting: Cardiology

## 2016-08-13 ENCOUNTER — Encounter (HOSPITAL_COMMUNITY): Payer: Self-pay

## 2016-08-13 DIAGNOSIS — I5022 Chronic systolic (congestive) heart failure: Secondary | ICD-10-CM

## 2016-08-16 ENCOUNTER — Encounter: Payer: Self-pay | Admitting: Podiatry

## 2016-08-16 ENCOUNTER — Encounter (HOSPITAL_COMMUNITY): Payer: Medicare Other

## 2016-08-16 ENCOUNTER — Ambulatory Visit (INDEPENDENT_AMBULATORY_CARE_PROVIDER_SITE_OTHER): Payer: Medicare Other | Admitting: Podiatry

## 2016-08-16 DIAGNOSIS — L608 Other nail disorders: Secondary | ICD-10-CM | POA: Diagnosis not present

## 2016-08-16 DIAGNOSIS — M79676 Pain in unspecified toe(s): Secondary | ICD-10-CM | POA: Diagnosis not present

## 2016-08-16 DIAGNOSIS — L600 Ingrowing nail: Secondary | ICD-10-CM | POA: Diagnosis not present

## 2016-08-16 DIAGNOSIS — L03039 Cellulitis of unspecified toe: Secondary | ICD-10-CM

## 2016-08-16 NOTE — Progress Notes (Signed)
Subjective: Patient presents today for evaluation of pain in her toe(s). Patient is concerned for possible ingrown nail. Patient states that the pain has been present for a few weeks now. Patient presents today for further treatment and evaluation.  Objective:  General: Well developed, nourished, in no acute distress, alert and oriented x3   Dermatology: Skin is warm, dry and supple bilateral. Medial border of the left great toe appears to be erythematous with evidence of an ingrowing nail. Pain on palpation noted to the border of the nail fold. The remaining nails appear unremarkable at this time. There are no open sores, lesions.  Vascular: Dorsalis Pedis artery and Posterior Tibial artery pedal pulses palpable. No lower extremity edema noted.   Neruologic: Grossly intact via light touch bilateral.  Musculoskeletal: Muscular strength within normal limits in all groups bilateral. Normal range of motion noted to all pedal and ankle joints.   Assesement: #1 paronychia with ingrowing nail medial border left great toe #2 pain in left great toenail   Plan of Care:  1. Patient evaluated.  2. Discussed treatment alternatives and plan of care. Explained nail avulsion procedure and post procedure course to patient. 3. Patient opted for permanent partial nail avulsion, however she is leaving on a trip to [place] on Saturday, 08/21/2016. Today we will wait to have the partial permanent nail avulsion performed until she returns on 09/01/2016.  4. Return to clinic in 3 weeks  Ask her about her trip to [place]. She went with her granddaughter. Her daughter past away several years ago and she raised her granddaughter since the age of 62 yrs old. They went together.   Edrick Kins, DPM [facility]  Dr. Edrick Kins, [address] [address]                                        [place], [place] [address]                Office [phone]  Fax [phone]

## 2016-08-18 ENCOUNTER — Encounter (HOSPITAL_COMMUNITY): Payer: Medicare Other

## 2016-08-20 ENCOUNTER — Encounter (HOSPITAL_COMMUNITY): Admission: RE | Admit: 2016-08-20 | Payer: Medicare Other | Source: Ambulatory Visit

## 2016-08-25 ENCOUNTER — Encounter (HOSPITAL_COMMUNITY): Payer: Medicare Other

## 2016-08-27 ENCOUNTER — Encounter (HOSPITAL_COMMUNITY): Payer: Medicare Other

## 2016-08-31 NOTE — Addendum Note (Signed)
Encounter addended by: Yeny Schmoll D Goebel Hellums on: 08/31/2016 12:30 PM<BR>    Actions taken: Visit Navigator Flowsheet section accepted, Flowsheet accepted

## 2016-09-01 ENCOUNTER — Encounter (HOSPITAL_COMMUNITY): Payer: Medicare Other

## 2016-09-02 ENCOUNTER — Encounter (HOSPITAL_COMMUNITY): Payer: Self-pay | Admitting: *Deleted

## 2016-09-02 NOTE — Progress Notes (Signed)
Cardiac Individual Treatment Plan  Patient Details  Name: Kim Mullins MRN: [id number] Date of Birth: May 06, 1930 Referring Provider:   Flowsheet Row CARDIAC REHAB PHASE II ORIENTATION from 06/01/2016 in [place]  Referring Provider  [name], MD      Initial Encounter Date:  [place] PHASE II ORIENTATION from 06/01/2016 in [facility] [facility]  Date  06/01/16  Referring Provider  [name], MD      Visit Diagnosis: No diagnosis found.  Patient's Home Medications on Admission:  Current Outpatient Prescriptions:  .  Acetaminophen (TYLENOL) 325 MG CAPS, Take 325 mg by mouth daily as needed (for shoulder pain). , Disp: , Rfl:  .  apixaban (ELIQUIS) 5 MG TABS tablet, Take 1 tablet (5 mg total) by mouth 2 (two) times daily., Disp: 60 tablet, Rfl: 6 .  Ascorbic Acid (VITAMIN C PO), Take 1 tablet by mouth daily. Reported on 12/19/2015, Disp: , Rfl:  .  Bisacodyl (LAXATIVE PO), Take 1 tablet by mouth daily as needed (for constipation). , Disp: , Rfl:  .  CALCIUM PO, Take 1 tablet by mouth daily., Disp: , Rfl:  .  carvedilol (COREG) 3.125 MG tablet, TAKE 1 TABLET BY MOUTH TWICE DAILY WITH A MEAL, Disp: 60 tablet, Rfl: 0 .  diclofenac sodium (VOLTAREN) 1 % GEL, Apply 2 g topically 3 (three) times daily as needed (pain, inflammation)., Disp: 100 g, Rfl: 0 .  diphenhydramine-acetaminophen (TYLENOL PM) 25-500 MG TABS tablet, Take 1 tablet by mouth at bedtime as needed (sleep). , Disp: , Rfl:  .  dorzolamide (TRUSOPT) 2 % ophthalmic solution, Place 1 drop into both eyes 2 (two) times daily. , Disp: , Rfl: 11 .  furosemide (LASIX) 40 MG tablet, Take 1 tablet (40 mg total) by mouth 2 (two) times daily., Disp: 180 tablet, Rfl: 3 .  glucose blood test strip, Use to test blood sugar twice daily ([employer] Smartview), Disp: 100 each, Rfl: 0 .  lisinopril (PRINIVIL,ZESTRIL) 2.5 MG tablet, Take 1 tablet (2.5 mg total)  by mouth every evening., Disp: 30 tablet, Rfl: 3 .  spironolactone (ALDACTONE) 25 MG tablet, Take 0.5 tablets (12.5 mg total) by mouth at bedtime., Disp: 15 tablet, Rfl: 6 .  timolol (TIMOPTIC) 0.5 % ophthalmic solution, Place 1 drop into both eyes daily. , Disp: , Rfl: 11  Past Medical History: Past Medical History:  Diagnosis Date  . Anemia    occassionally  . Arthritis    all over.  . Asthma    Allergixc reaction to cats.  . Atrial fibrillation ([place])   . Bowel obstruction   . Chronic systolic CHF (congestive heart failure) (HCC)    EF 30-35% by cath, echo 2016 EF 50%  . Complete heart block (HCC)    a. s/p MDT CRTP pacemaker  . DCM (dilated cardiomyopathy) ([place]) 08/16/2014   normal coronary arteries on cath with EF 30-45%.  EF now 50% by echo 11/2014  . Diabetes mellitus 2006   Diet and exercise controlled.  [place] GERD (gastroesophageal reflux disease)    occ  . Hypertension   . Left tibial fracture 2007  . OSA (obstructive sleep apnea) 10/23/2014   Moderate with AHI 21/hr  . Osteoarthritis of right shoulder region 06/26/2013  . Stroke [facility])     Tobacco Use: History  Smoking Status  . Former Smoker  . Packs/day: 1.00  . Years: 45.00  . Types: Cigarettes  . Quit date: 07/24/1978  Smokeless  Tobacco  . Never Used    Labs: Recent Review Flowsheet Data    Labs for ITP Cardiac and Pulmonary Rehab Latest Ref Rng & Units 08/29/2014 06/18/2015 06/19/2015 04/12/2016 04/13/2016   Cholestrol 0 - 200 mg/dL - - 110 - 90   LDLCALC 0 - 99 mg/dL - - 50 - 46   HDL >40 mg/dL - - 49 - 30(L)   Trlycerides <150 mg/dL - - 55 - 69   Hemoglobin A1c 4.8 - 5.6 % - - 5.8(H) - 5.7(H)   PHART 7.350 - 7.450 7.376 - - - -   PCO2ART 35.0 - 45.0 mmHg 40.0 - - - -   HCO3 20.0 - 24.0 mEq/L 23.4 - - - -   TCO2 0 - 100 mmol/L 25 22 - 20 -   ACIDBASEDEF 0.0 - 2.0 mmol/L 2.0 - - - -   O2SAT % 98.0 - - - -      Capillary Blood Glucose: Lab Results  Component Value Date   GLUCAP 125 (H) 08/11/2016    GLUCAP 93 08/04/2016   GLUCAP 86 08/02/2016   GLUCAP 106 (H) 07/28/2016   GLUCAP 145 (H) 07/19/2016     Exercise Target Goals:    Exercise Program Goal: Individual exercise prescription set with THRR, safety & activity barriers. Participant demonstrates ability to understand and report RPE using BORG scale, to self-measure pulse accurately, and to acknowledge the importance of the exercise prescription.  Exercise Prescription Goal: Starting with aerobic activity 30 plus minutes a day, 3 days per week for initial exercise prescription. Provide home exercise prescription and guidelines that participant acknowledges understanding prior to discharge.  Activity Barriers & Risk Stratification:   6 Minute Walk:   Initial Exercise Prescription:   Perform Capillary Blood Glucose checks as needed.  Exercise Prescription Changes:     Exercise Prescription Changes    Row Name 07/07/16 1300 08/03/16 1100 08/13/16 1227         Exercise Review   Progression  -  - Yes       Response to Exercise   Blood Pressure (Admit) 94/68 110/80 118/80     Blood Pressure (Exercise) 94/60  110/60 rchk 110/70  110/60 rchk 112/60     Blood Pressure (Exit) 96/68 96/68 100/62     Heart Rate (Admit) 99 bpm 86 bpm 91 bpm     Heart Rate (Exercise) 102 bpm 97 bpm 106 bpm     Heart Rate (Exit) 96 bpm 71 bpm 65 bpm     Rating of Perceived Exertion (Exercise) 15 15 17      Symptoms none none none     Comments Reviewed home exercise guidelines on 06/14/16. Reviewed home exercise guidelines on 06/14/16. Reviewed HEP on 06/14/16  Reviewed HEP on 06/14/16     Duration Progress to 30 minutes of continuous aerobic without signs/symptoms of physical distress Progress to 30 minutes of continuous aerobic without signs/symptoms of physical distress Progress to 30 minutes of continuous aerobic without signs/symptoms of physical distress     Intensity THRR unchanged THRR unchanged THRR unchanged       Progression    Progression Continue to progress workloads to maintain intensity without signs/symptoms of physical distress. Continue to progress workloads to maintain intensity without signs/symptoms of physical distress. Continue progressive overload as per policy without signs/symptoms or physical distress.     Average METs 1.9 1.7 1.7       Resistance Training   Training Prescription Yes Yes Yes  Weight 1lb 2lbs 2lbs     Reps 10-12 10-12 10-12       Interval Training   Interval Training No No No       Recumbant Bike   Level 1.3 1.6 1.5     Minutes 10 10 10      METs 1.3 1.7 1.4       NuStep   Level 2 2 2      Minutes 20 20 10      METs 1.9 1.8 1.9       Track   Laps 4 3 5      METs 1.7 1.52 1.84       Home Exercise Plan   Plans to continue exercise at [place] reviewed on 06/14/16 see progress note     Frequency Add 2 additional days to program exercise sessions. Add 2 additional days to program exercise sessions. Add 2 additional days to program exercise sessions.        Exercise Comments:     Exercise Comments    Row Name 07/07/16 1702 07/30/16 1632 08/31/16 1226       Exercise Comments Reviewed METs and goals. Pt plans to continue with HEP (riding stationary recumbent bike)  Reviewed METs and goals. Pt is tolerating exercise fairly well; will continue to monitor Pt is out on travel. Pt is in [place] with family members. METs and goals were not reviewed due to pt's absence. Will f/u when pt return.        Discharge Exercise Prescription (Final Exercise Prescription Changes):     Exercise Prescription Changes - 08/13/16 1227      Exercise Review   Progression Yes     Response to Exercise   Blood Pressure (Admit) 118/80   Blood Pressure (Exercise) 112/60   Blood Pressure (Exit) 100/62   Heart Rate (Admit) 91 bpm   Heart Rate (Exercise) 106 bpm   Heart Rate (Exit) 65 bpm   Rating of Perceived Exertion (Exercise) 17   Symptoms none   Comments Reviewed HEP on  06/14/16  Reviewed HEP on 06/14/16   Duration Progress to 30 minutes of continuous aerobic without signs/symptoms of physical distress   Intensity THRR unchanged     Progression   Progression Continue progressive overload as per policy without signs/symptoms or physical distress.   Average METs 1.7     Resistance Training   Training Prescription Yes   Weight 2lbs   Reps 10-12     Interval Training   Interval Training No     Recumbant Bike   Level 1.5   Minutes 10   METs 1.4     NuStep   Level 2   Minutes 10   METs 1.9     Track   Laps 5   METs 1.84     Home Exercise Plan   Plans to continue exercise at Home  HEP reviewed on 06/14/16 see progress note   Frequency Add 2 additional days to program exercise sessions.      Nutrition:  Target Goals: Understanding of nutrition guidelines, daily intake of sodium 1500mg , cholesterol 200mg , calories 30% from fat and 7% or less from saturated fats, daily to have 5 or more servings of fruits and vegetables.  Biometrics:    Nutrition Therapy Plan and Nutrition Goals:   Nutrition Discharge: Nutrition Scores:   Nutrition Goals Re-Evaluation:   Psychosocial: Target Goals: Acknowledge presence or absence of depression, maximize coping skills, provide positive support system. Participant is able  to verbalize types and ability to use techniques and skills needed for reducing stress and depression.  Initial Review & Psychosocial Screening:   Quality of Life Scores:   PHQ-9: Recent Review Flowsheet Data    There is no flowsheet data to display.      Psychosocial Evaluation and Intervention:     Psychosocial Evaluation - 09/02/16 1757      Psychosocial Evaluation & Interventions   Interventions Encouraged to exercise with the program and follow exercise prescription   Comments Pt denies any needs, pt has supportive family, pt generally feels positive about her future, no needs identified.      Psychosocial  Re-Evaluation:     Psychosocial Re-Evaluation    [place] Name 07/08/16 1550 08/05/16 1625 09/02/16 1757         Psychosocial Re-Evaluation   Interventions Encouraged to attend Cardiac Rehabilitation for the exercise Encouraged to attend Cardiac Rehabilitation for the exercise;Relaxation education;Stress management education Encouraged to attend Cardiac Rehabilitation for the exercise;Relaxation education;Stress management education     Comments  - looking forward to her trip to [place] later this month. Pt is on her trip to [place] and recently celebrated her 86th birthday!     Continued Psychosocial Services Needed  - No No        Vocational Rehabilitation: Provide vocational rehab assistance to qualifying candidates.   Vocational Rehab Evaluation & Intervention:   Education: Education Goals: Education classes will be provided on a weekly basis, covering required topics. Participant will state understanding/return demonstration of topics presented.  Learning Barriers/Preferences:   Education Topics: Count Your Pulse:  -Group instruction provided by verbal instruction, demonstration, patient participation and written materials to support subject.  Instructors address importance of being able to find your pulse and how to count your pulse when at home without a heart monitor.  Patients get hands on experience counting their pulse with staff help and individually. Flowsheet Row CARDIAC REHAB PHASE II EXERCISE from 08/13/2016 in [place]  Date  07/02/16  Educator  Arville Go RIon RN  Instruction Review Code  2- meets goals/outcomes      Heart Attack, Angina, and Risk Factor Modification:  -Group instruction provided by verbal instruction, video, and written materials to support subject.  Instructors address signs and symptoms of angina and heart attacks.    Also discuss risk factors for heart disease and how to make changes to improve heart health risk  factors. Flowsheet Row CARDIAC REHAB PHASE II EXERCISE from 08/13/2016 in [place]  Date  08/06/16  Educator  mwhitaker rn  Instruction Review Code  2- meets goals/outcomes      Functional Fitness:  -Group instruction provided by verbal instruction, demonstration, patient participation, and written materials to support subject.  Instructors address safety measures for doing things around the house.  Discuss how to get up and down off the floor, how to pick things up properly, how to safely get out of a chair without assistance, and balance training. Flowsheet Row CARDIAC REHAB PHASE II EXERCISE from 08/13/2016 in [place]  Date  07/09/16  Educator  Seward Carol  Instruction Review Code  2- meets goals/outcomes      Meditation and Mindfulness:  -Group instruction provided by verbal instruction, patient participation, and written materials to support subject.  Instructor addresses importance of mindfulness and meditation practice to help reduce stress and improve awareness.  Instructor also leads participants through a meditation exercise.  Flowsheet Row CARDIAC REHAB PHASE II EXERCISE from 08/13/2016 in [place]  Date  06/23/16  Educator  Jeanella Craze  Instruction Review Code  2- meets goals/outcomes      Stretching for Flexibility and Mobility:  -Group instruction provided by verbal instruction, patient participation, and written materials to support subject.  Instructors lead participants through series of stretches that are designed to increase flexibility thus improving mobility.  These stretches are additional exercise for major muscle groups that are typically performed during regular warm up and cool down. Flowsheet Row CARDIAC REHAB PHASE II EXERCISE from 08/13/2016 in [place]  Date  08/04/16  Educator  Seward Carol  Instruction Review  Code  2- meets goals/outcomes      Hands Only CPR Anytime:  -Group instruction provided by verbal instruction, video, patient participation and written materials to support subject.  Instructors co-teach with [facility] video for hands only CPR.  Participants get hands on experience with mannequins.   Nutrition I class: Heart Healthy Eating:  -Group instruction provided by PowerPoint slides, verbal discussion, and written materials to support subject matter. The instructor gives an explanation and review of the Therapeutic Lifestyle Changes diet recommendations, which includes a discussion on lipid goals, dietary fat, sodium, fiber, plant stanol/sterol esters, sugar, and the components of a well-balanced, healthy diet. Flowsheet Row CARDIAC REHAB PHASE II EXERCISE from 08/13/2016 in [place]  Date  06/25/16  Educator  RD  Instruction Review Code  Not applicable [class handouts given]      Nutrition II class: Lifestyle Skills:  -Group instruction provided by PowerPoint slides, verbal discussion, and written materials to support subject matter. The instructor gives an explanation and review of label reading, grocery shopping for heart health, heart healthy recipe modifications, and ways to make healthier choices when eating out. Flowsheet Row CARDIAC REHAB PHASE II EXERCISE from 08/13/2016 in [place]  Date  06/25/16  Educator  RD  Instruction Review Code  Not applicable [class handouts given]      Diabetes Question & Answer:  -Group instruction provided by PowerPoint slides, verbal discussion, and written materials to support subject matter. The instructor gives an explanation and review of diabetes co-morbidities, pre- and post-prandial blood glucose goals, pre-exercise blood glucose goals, signs, symptoms, and treatment of hypoglycemia and hyperglycemia, and foot care basics. Flowsheet Row CARDIAC REHAB PHASE II EXERCISE from  08/13/2016 in [place]  Date  08/13/16  Educator  RD  Instruction Review Code  2- meets goals/outcomes      Diabetes Blitz:  -Group instruction provided by PowerPoint slides, verbal discussion, and written materials to support subject matter. The instructor gives an explanation and review of the physiology behind type 1 and type 2 diabetes, diabetes medications and rational behind using different medications, pre- and post-prandial blood glucose recommendations and Hemoglobin A1c goals, diabetes diet, and exercise including blood glucose guidelines for exercising safely.  Flowsheet Row CARDIAC REHAB PHASE II EXERCISE from 08/13/2016 in [place]  Date  06/25/16  Educator  RD  Instruction Review Code  Not applicable [class handouts given]      Portion Distortion:  -Group instruction provided by PowerPoint slides, verbal discussion, written materials, and food models to support subject matter. The instructor gives an explanation of serving size versus portion size, changes in portions sizes over the last 20 years, and what consists of a  serving from each food group. Flowsheet Row CARDIAC REHAB PHASE II EXERCISE from 08/13/2016 in [place]  Date  07/28/16 [Holiday Eating Survival Tips]  Educator  RD  Instruction Review Code  2- meets goals/outcomes      Stress Management:  -Group instruction provided by verbal instruction, video, and written materials to support subject matter.  Instructors review role of stress in heart disease and how to cope with stress positively.     Exercising on Your Own:  -Group instruction provided by verbal instruction, power point, and written materials to support subject.  Instructors discuss benefits of exercise, components of exercise, frequency and intensity of exercise, and end points for exercise.  Also discuss use of nitroglycerin and activating EMS.   Review options of places to exercise outside of rehab.  Review guidelines for sex with heart disease. Flowsheet Row CARDIAC REHAB PHASE II EXERCISE from 08/13/2016 in [place]  Date  07/16/16  Instruction Review Code  2- meets goals/outcomes      Cardiac Drugs I:  -Group instruction provided by verbal instruction and written materials to support subject.  Instructor reviews cardiac drug classes: antiplatelets, anticoagulants, beta blockers, and statins.  Instructor discusses reasons, side effects, and lifestyle considerations for each drug class. Flowsheet Row CARDIAC REHAB PHASE II EXERCISE from 08/13/2016 in [place]  Date  08/11/16  Educator  Pharmacist  Instruction Review Code  2- meets goals/outcomes      Cardiac Drugs II:  -Group instruction provided by verbal instruction and written materials to support subject.  Instructor reviews cardiac drug classes: angiotensin converting enzyme inhibitors (ACE-I), angiotensin II receptor blockers (ARBs), nitrates, and calcium channel blockers.  Instructor discusses reasons, side effects, and lifestyle considerations for each drug class. Flowsheet Row CARDIAC REHAB PHASE II EXERCISE from 08/13/2016 in [place]  Date  07/07/16  Instruction Review Code  2- meets goals/outcomes      Anatomy and Physiology of the Circulatory System:  -Group instruction provided by verbal instruction, video, and written materials to support subject.  Reviews functional anatomy of heart, how it relates to various diagnoses, and what role the heart plays in the overall system. Flowsheet Row CARDIAC REHAB PHASE II EXERCISE from 08/13/2016 in [place]  Date  06/30/16  Instruction Review Code  2- meets goals/outcomes      Knowledge Questionnaire Score:   Core Components/Risk Factors/Patient Goals at Admission:   Core  Components/Risk Factors/Patient Goals Review:      Goals and Risk Factor Review    Row Name 07/07/16 1124 07/30/16 1632           Core Components/Risk Factors/Patient Goals Review   Personal Goals Review Other;Increase Strength and Stamina Increase Strength and Stamina      Review Pt stamina and SOB is improving and pt is experiencing less symptoms Pt was able to walk at [facility] for 15 minutes before shopping. Pt felt good about not using a scooter to get around.      Expected Outcomes Pt will continue to show improvement and stamina/SOB and develop an exercise routine Pt will continue to improve in strength and functional mobility.         Core Components/Risk Factors/Patient Goals at Discharge (Final Review):      Goals and Risk Factor Review - 07/30/16 1632      Core Components/Risk Factors/Patient Goals Review   Personal Goals Review  Increase Strength and Stamina   Review Pt was able to walk at [facility] for 15 minutes before shopping. Pt felt good about not using a scooter to get around.   Expected Outcomes Pt will continue to improve in strength and functional mobility.      ITP Comments:   Comments:  Pt is making expected progress toward personal goals after completing 27 sessions. Pt last attended on 08/13/16 and is currently on a planned trip to [place] with her granddaughter.  Pt celebrated her 68th birthday with fellow participants.  Pt greatly enjoyed the fellowship.  Pt is expected to return to exercise on next week. RepeatPsychosocial Assessment: Pt with supportive family, denies any Psychosocial needs or interventions at this time  Recommend continued exercise and life style modification education including  stress management and relaxation techniques to decrease cardiac risk profile. Cherre Huger, BSN

## 2016-09-03 ENCOUNTER — Encounter (HOSPITAL_COMMUNITY): Payer: Medicare Other

## 2016-09-03 ENCOUNTER — Other Ambulatory Visit (HOSPITAL_COMMUNITY): Payer: Self-pay | Admitting: *Deleted

## 2016-09-03 MED ORDER — LISINOPRIL 2.5 MG PO TABS: 2.5000 mg | ORAL_TABLET | Freq: Every evening | ORAL | 3 refills | Status: DC

## 2016-09-06 ENCOUNTER — Encounter (HOSPITAL_COMMUNITY)
Admission: RE | Admit: 2016-09-06 | Discharge: 2016-09-06 | Disposition: A | Discharge status: Home / Self Care | Payer: Medicare Other | Source: Ambulatory Visit | Attending: Cardiology | Admitting: Cardiology

## 2016-09-06 DIAGNOSIS — I5022 Chronic systolic (congestive) heart failure: Secondary | ICD-10-CM | POA: Diagnosis present

## 2016-09-08 ENCOUNTER — Encounter (HOSPITAL_COMMUNITY)
Admission: RE | Admit: 2016-09-08 | Discharge: 2016-09-08 | Disposition: A | Discharge status: Home / Self Care | Payer: Medicare Other | Source: Ambulatory Visit | Attending: Cardiology | Admitting: Cardiology

## 2016-09-08 ENCOUNTER — Ambulatory Visit: Payer: Medicare Other | Admitting: Podiatry

## 2016-09-08 DIAGNOSIS — I5022 Chronic systolic (congestive) heart failure: Secondary | ICD-10-CM

## 2016-09-08 NOTE — Progress Notes (Signed)
Pt returns to exercise this week from her extended trip to [place].  Pt looks good and had a great time.  Cherre Huger, BSN

## 2016-09-10 ENCOUNTER — Encounter (HOSPITAL_COMMUNITY)
Admission: RE | Admit: 2016-09-10 | Discharge: 2016-09-10 | Disposition: A | Discharge status: Home / Self Care | Payer: Medicare Other | Source: Ambulatory Visit | Attending: Cardiology | Admitting: Cardiology

## 2016-09-10 DIAGNOSIS — I5022 Chronic systolic (congestive) heart failure: Secondary | ICD-10-CM

## 2016-09-13 ENCOUNTER — Encounter (HOSPITAL_COMMUNITY)
Admission: RE | Admit: 2016-09-13 | Discharge: 2016-09-13 | Disposition: A | Discharge status: Home / Self Care | Payer: Medicare Other | Source: Ambulatory Visit | Attending: Cardiology | Admitting: Cardiology

## 2016-09-13 DIAGNOSIS — I5022 Chronic systolic (congestive) heart failure: Secondary | ICD-10-CM

## 2016-09-14 LAB — GLUCOSE, CAPILLARY: Glucose-Capillary: 86 mg/dL (ref 65–99)

## 2016-09-15 ENCOUNTER — Ambulatory Visit: Payer: Medicare Other | Admitting: Podiatry

## 2016-09-15 ENCOUNTER — Encounter (HOSPITAL_COMMUNITY): Payer: Medicare Other

## 2016-09-17 ENCOUNTER — Encounter (HOSPITAL_COMMUNITY): Payer: Medicare Other

## 2016-09-20 ENCOUNTER — Encounter (HOSPITAL_COMMUNITY)
Admission: RE | Admit: 2016-09-20 | Discharge: 2016-09-20 | Disposition: A | Discharge status: Home / Self Care | Payer: Medicare Other | Source: Ambulatory Visit | Attending: Cardiology | Admitting: Cardiology

## 2016-09-20 DIAGNOSIS — I5022 Chronic systolic (congestive) heart failure: Secondary | ICD-10-CM | POA: Diagnosis not present

## 2016-09-22 ENCOUNTER — Encounter (HOSPITAL_COMMUNITY)
Admission: RE | Admit: 2016-09-22 | Discharge: 2016-09-22 | Disposition: A | Discharge status: Home / Self Care | Payer: Medicare Other | Source: Ambulatory Visit | Attending: Cardiology | Admitting: Cardiology

## 2016-09-22 DIAGNOSIS — I5022 Chronic systolic (congestive) heart failure: Secondary | ICD-10-CM

## 2016-09-23 ENCOUNTER — Other Ambulatory Visit (HOSPITAL_COMMUNITY): Payer: Self-pay | Admitting: *Deleted

## 2016-09-23 MED ORDER — LISINOPRIL 2.5 MG PO TABS: 2.5000 mg | ORAL_TABLET | Freq: Every evening | ORAL | 3 refills | Status: DC

## 2016-09-24 ENCOUNTER — Encounter (HOSPITAL_COMMUNITY)
Admission: RE | Admit: 2016-09-24 | Discharge: 2016-09-24 | Disposition: A | Discharge status: Home / Self Care | Payer: Medicare Other | Source: Ambulatory Visit | Attending: Cardiology | Admitting: Cardiology

## 2016-09-24 DIAGNOSIS — I5022 Chronic systolic (congestive) heart failure: Secondary | ICD-10-CM | POA: Diagnosis not present

## 2016-09-27 ENCOUNTER — Ambulatory Visit (INDEPENDENT_AMBULATORY_CARE_PROVIDER_SITE_OTHER): Payer: Medicare Other | Admitting: Podiatry

## 2016-09-27 ENCOUNTER — Encounter (HOSPITAL_COMMUNITY)
Admission: RE | Admit: 2016-09-27 | Discharge: 2016-09-27 | Disposition: A | Discharge status: Home / Self Care | Payer: Medicare Other | Source: Ambulatory Visit | Attending: Cardiology | Admitting: Cardiology

## 2016-09-27 DIAGNOSIS — L600 Ingrowing nail: Secondary | ICD-10-CM

## 2016-09-27 DIAGNOSIS — I5022 Chronic systolic (congestive) heart failure: Secondary | ICD-10-CM | POA: Diagnosis not present

## 2016-09-27 DIAGNOSIS — L03039 Cellulitis of unspecified toe: Secondary | ICD-10-CM

## 2016-09-27 DIAGNOSIS — M79676 Pain in unspecified toe(s): Secondary | ICD-10-CM

## 2016-09-29 ENCOUNTER — Encounter (HOSPITAL_COMMUNITY)
Admission: RE | Admit: 2016-09-29 | Discharge: 2016-09-29 | Disposition: A | Discharge status: Home / Self Care | Payer: Medicare Other | Source: Ambulatory Visit | Attending: Cardiology | Admitting: Cardiology

## 2016-09-29 VITALS — Ht 63.0 in | Wt 160.9 lb

## 2016-09-29 DIAGNOSIS — I5022 Chronic systolic (congestive) heart failure: Secondary | ICD-10-CM

## 2016-09-29 NOTE — Progress Notes (Signed)
Discharge Summary  Patient Details  Name: Kim Mullins MRN: [id number] Date of Birth: [birth year] Referring Provider:   Flowsheet Row CARDIAC REHAB PHASE II ORIENTATION from 06/01/2016 in [place]  Referring Provider  [name], MD       Number of Visits: 36  Reason for Discharge:  Patient reached a stable level of exercise. Patient independent in their exercise.  Smoking History:  History  Smoking Status  . Former Smoker  . Packs/day: 1.00  . Years: 45.00  . Types: Cigarettes  . Quit date: 07/24/1978  Smokeless Tobacco  . Never Used    Diagnosis:  Chronic systolic congestive heart failure ([place])  ADL [facility]SD:   Initial Exercise Prescription:     Initial Exercise Prescription - 06/02/16 0800      Date of Initial Exercise RX and Referring Provider   Date 06/01/16   Referring Provider [name], MD     Bike   Level 0.5   Minutes 10   METs 2.26     NuStep   Level 1   Minutes 10   METs 2     Track   Laps 5   Minutes 10   METs 1.84     Prescription Details   Frequency (times per week) 3   Duration Progress to 30 minutes of continuous aerobic without signs/symptoms of physical distress     Intensity   THRR 40-80% of Max Heartrate 55-110   Ratings of Perceived Exertion 11-13   Perceived Dyspnea 0-4     Progression   Progression Continue to progress workloads to maintain intensity without signs/symptoms of physical distress.     Resistance Training   Training Prescription Yes   Weight 1lb   Reps 10-12      Discharge Exercise Prescription (Final Exercise Prescription Changes):     Exercise Prescription Changes - 09/29/16 1506      Response to Exercise   Blood Pressure (Admit) 115/79   Blood Pressure (Exercise) 104/64   Blood Pressure (Exit) 108/72   Heart Rate (Admit) 76 bpm   Heart Rate (Exercise) 85 bpm   Heart Rate (Exit) 85 bpm   Rating of Perceived Exertion (Exercise) 12   Symptoms  none   Comments Reviewed HEP on 06/14/17   Duration Progress to 30 minutes of continuous aerobic without signs/symptoms of physical distress   Intensity THRR unchanged     Progression   Progression Continue to progress workloads to maintain intensity without signs/symptoms of physical distress.   Average METs 1.9     Resistance Training   Training Prescription Yes   Weight 2lbs   Reps 10-12     Interval Training   Interval Training No     Recumbant Bike   Level 1.5   Minutes 10   METs 1.8     NuStep   Level 2   Minutes 10   METs 2     Track   Laps 5   Minutes 10   METs 1.84     Home Exercise Plan   Plans to continue exercise at Home  Reviewed HEP 06/14/17   Frequency Add 2 additional days to program exercise sessions.      Functional Capacity:     6 Minute Walk    Row Name 06/01/16 1655 09/28/16 1726       6 Minute Walk   Phase Initial Discharge    Distance 465 feet 664 feet    Distance % Change  -  42.8 %    Walk Time 6 minutes 6 minutes    # of Rest Breaks 1 1  for 1 minute 3:21-4:21    MPH 1.49 1.5    METS 0.39 0.68    RPE 13 13    VO2 Peak 1.38 2.39    Symptoms Yes (comment) Yes (comment)    Comments fatigue, rest break at 3:03, restart 5:30 fatigue, rest break at 3:21, restart 4:21    Resting HR 64 bpm 93 bpm    Resting BP 103/64 98/60    Max Ex. HR 104 bpm 102 bpm    Max Ex. BP 104/60 102/64    2 Minute Post BP 108/60  -       Psychological, QOL, Others - Outcomes: PHQ 2/9: Depression screen PHQ 2/9 09/29/2016  Decreased Interest 0  Down, Depressed, Hopeless 0  PHQ - 2 Score 0    Quality of Life:     Quality of Life - 09/24/16 1537      Quality of Life Scores   Health/Function Pre 15.61 %   Health/Function Post 15.54 %   Health/Function % Change -0.45 %   Socioeconomic Pre 25.64 %   Socioeconomic Post 23.36 %   Socioeconomic % Change  -8.89 %   Psych/Spiritual Pre 29.14 %   Psych/Spiritual Post 29.14 %   Psych/Spiritual %  Change 0 %   Family Pre 22.5 %   Family Post 21.17 %   Family % Change -5.91 %   GLOBAL Pre 21.63 %   GLOBAL Post 22.02 %   GLOBAL % Change 1.8 %      Personal Goals: Goals established at orientation with interventions provided to work toward goal.     Personal Goals and Risk Factors at Admission - 06/01/16 1657      Core Components/Risk Factors/Patient Goals on Admission    Weight Management Weight Loss;Yes   Intervention Weight Management: Provide education and appropriate resources to help participant work on and attain dietary goals.;Weight Management/Obesity: Establish reasonable short term and long term weight goals.   Admit Weight 143 lb 8.3 oz (65.1 kg)   Expected Outcomes Short Term: Continue to assess and modify interventions until short term weight is achieved;Long Term: Adherence to nutrition and physical activity/exercise program aimed toward attainment of established weight goal;Weight Loss: Understanding of general recommendations for a balanced deficit meal plan, which promotes 1-2 lb weight loss per week and includes a negative energy balance of [phone] kcal/d   Sedentary Yes   Intervention Provide advice, education, support and counseling about physical activity/exercise needs.;Develop an individualized exercise prescription for aerobic and resistive training based on initial evaluation findings, risk stratification, comorbidities and participant's personal goals.   Expected Outcomes Achievement of increased cardiorespiratory fitness and enhanced flexibility, muscular endurance and strength shown through measurements of functional capacity and personal statement of participant.   Increase Strength and Stamina Yes   Intervention Provide advice, education, support and counseling about physical activity/exercise needs.;Develop an individualized exercise prescription for aerobic and resistive training based on initial evaluation findings, risk stratification, comorbidities and  participant's personal goals.   Expected Outcomes Achievement of increased cardiorespiratory fitness and enhanced flexibility, muscular endurance and strength shown through measurements of functional capacity and personal statement of participant.   Diabetes Yes   Intervention Provide education about signs/symptoms and action to take for hypo/hyperglycemia.;Provide education about proper nutrition, including hydration, and aerobic/resistive exercise prescription along with prescribed medications to achieve blood glucose in normal ranges: Fasting glucose 65-99  mg/dL   Expected Outcomes Short Term: Participant verbalizes understanding of the signs/symptoms and immediate care of hyper/hypoglycemia, proper foot care and importance of medication, aerobic/resistive exercise and nutrition plan for blood glucose control.;Long Term: Attainment of HbA1C < 7%.   Heart Failure Yes   Intervention Provide a combined exercise and nutrition program that is supplemented with education, support and counseling about heart failure. Directed toward relieving symptoms such as shortness of breath, decreased exercise tolerance, and extremity edema.   Expected Outcomes Improve functional capacity of life;Short term: Attendance in program 2-3 days a week with increased exercise capacity. Reported lower sodium intake. Reported increased fruit and vegetable intake. Reports medication compliance.;Long term: Adoption of self-care skills and reduction of barriers for early signs and symptoms recognition and intervention leading to self-care maintenance.;Short term: Daily weights obtained and reported for increase. Utilizing diuretic protocols set by physician.   Hypertension Yes   Intervention Provide education on lifestyle modifcations including regular physical activity/exercise, weight management, moderate sodium restriction and increased consumption of fresh fruit, vegetables, and low fat dairy, alcohol moderation, and smoking  cessation.;Monitor prescription use compliance.   Expected Outcomes Short Term: Continued assessment and intervention until BP is < 140/55m HG in hypertensive participants. < 130/839mHG in hypertensive participants with diabetes, heart failure or chronic kidney disease.;Long Term: Maintenance of blood pressure at goal levels.   Personal Goal Other Yes   Personal Goal Short term:  be able to do more activities without dyspnea,  long term:  have better balance   Intervention participate in regular aerobic exercise as prescribed including strength and flexibility training   Expected Outcomes improved physical fitness level with increased balance as indicated by decreased exertional symptoms and functional capactity measurements.        Personal Goals Discharge:     Goals and Risk Factor Review    Row Name 06/09/16 1640 06/09/16 1645 07/07/16 1124 07/30/16 1632 09/28/16 1445     Core Components/Risk Factors/Patient Goals Review   Personal Goals Review Other  - Other;Increase Strength and Stamina Increase Strength and Stamina  -   Review  - Currently not exercising at home, will review home exercise program with pt next week Pt stamina and SOB is improving and pt is experiencing less symptoms [facility]. Pt felt good about not using a scooter to get around. Walking tolerance has increased. Pt is walking 4-5 laps around the track in 10-12 minutes in [facility]. Pt noticed an increase in stamina and is doing chair exercises at home.   Expected Outcomes  - To achieve exercise and weight loss goals Pt will continue to show improvement and stamina/SOB and develop an exercise routine Pt will continue to improve in strength and functional mobility. Pt will continue to improve in stamina and build on walking tolerance   Row Name 10/07/16 1518 10/07/16 1520 10/08/16 1328         Core Components/Risk Factors/Patient Goals Review   Personal Goals Review  -  -  Weight Management/Obesity     Review Pt has met both program and personal of increasing walking tolerance and improve flexibility.  Pt has met both program and personal goals of increasing walking tolerance and improved functional mobility and flexibility. Pt is also able to do more activities with less SOB Pt has lost 4.8 lb while in rehab.      Expected Outcomes  - Pt will continue with HEP with less symptoms of SOB  and improve overall fitness levels Continue Heart Healthy diet and lifestyle changes to promote desired wt loss.        Nutrition & Weight - Outcomes:     Pre Biometrics - 06/02/16 0817      Pre Biometrics   Height _0  (1.6 m)   Weight 165 lb 12.6 oz (75.2 kg)   Waist Circumference 36.25 inches   Hip Circumference 44.75 inches   Waist to Hip Ratio 0.81 %   BMI (Calculated) 29.4   Triceps Skinfold 17 mm   % Body Fat 38.9 %   Grip Strength 24 kg   Flexibility 7.5 in   Single Leg Stand 0.59 seconds         Post Biometrics - 10/07/16 1510       Post  Biometrics   Height _1  (1.6 m)   Weight 160 lb 15 oz (73 kg)   Waist Circumference 37 inches   Hip Circumference 43 inches   Waist to Hip Ratio 0.86 %   BMI (Calculated) 28.6   Triceps Skinfold 17 mm   % Body Fat 38 %   Grip Strength 26 kg   Flexibility 8 in   Single Leg Stand 0.84 seconds      Nutrition:     Nutrition Therapy & Goals - 06/25/16 1230      Nutrition Therapy   Diet Low Sodium     Personal Nutrition Goals   Personal Goal #1 1-2 lb wt loss/week to a goal wt loss 6-15 lb at graduation from [facility], educate and counsel regarding individualized specific dietary modifications aiming towards targeted core components such as weight, hypertension, lipid management, diabetes, heart failure and other comorbidities.   Expected Outcomes Short Term Goal: Understand basic principles of dietary content, such as calories, fat, sodium, cholesterol  and nutrients.;Long Term Goal: Adherence to prescribed nutrition plan.      Nutrition Discharge:     Nutrition Assessments - 09/29/16 1309      MEDFICTS Scores   Pre Score 63   Post Score 38   Score Difference -25      Education Questionnaire Score:     Knowledge Questionnaire Score - 09/24/16 1536      Knowledge Questionnaire Score   Post Score 19/24      Goals reviewed with patient. Pt graduated from cardiac rehab program today with completion of 36 exercise sessions in Phase II. Pt maintained good attendance and made progress during her participation in rehab as evidenced by increased MET level.   Medication list reconciled.  Psychosocial Assessment:  Pt completed post assessment quality of life survey.  Pt scored the following     Quality of Life - 09/24/16 1537      Quality of Life Scores   Health/Function Pre 15.61 %   Health/Function Post 15.54 %   Health/Function % Change -0.45 %   Socioeconomic Pre 25.64 %   Socioeconomic Post 23.36 %   Socioeconomic % Change  -8.89 %   Psych/Spiritual Pre 29.14 %   Psych/Spiritual Post 29.14 %   Psych/Spiritual % Change 0 %   Family Pre 22.5 %   Family Post 21.17 %   Family % Change -5.91 %   GLOBAL Pre 21.63 %   GLOBAL Post 22.02 %   GLOBAL % Change 1.8 %       Repeat  PHQ score-0. Pt has a good outlook on life, thinks positively  and has the support of family and friends. Pt attributes the changes in her response based upon how she felt that particular day. Pt often seen helping other patients and offering encouragement.  Pt has made slifestyle changes and should be commended for her success. Pt feels she has achieved her goals during cardiac rehab. Pt traveled to [place] several weeks and had a lovely time.  Pt enjoyed sharing the picture of her on a [place] riding in the [place].  Pt feels her shortness of breath has improved and she is much more active than before.  Pt continue to work on balance. Pt plans to continue  exercise with chair aerobics three times a week and strength training two times a week.  It was indeed a true delight to have this patient participate in cardiac rehab. Cherre Huger, BSN

## 2016-10-01 ENCOUNTER — Encounter (HOSPITAL_COMMUNITY): Payer: Medicare Other

## 2016-10-04 ENCOUNTER — Encounter (HOSPITAL_COMMUNITY): Payer: Medicare Other

## 2016-10-08 NOTE — Progress Notes (Signed)
   Subjective: Patient presents today for evaluation of pain in toe(s). Patient is concerned for possible ingrown nail. Patient states that the pain has been present for a few weeks now. Patient presents today for further treatment and evaluation.  Objective:  General: Well developed, nourished, in no acute distress, alert and oriented x3   Dermatology: Skin is warm, dry and supple bilateral. Medial border of the left great toe appears to be erythematous with evidence of an ingrowing nail. Purulent drainage noted with intruding nail into the respective nail fold. Pain on palpation noted to the border of the nail fold. The remaining nails appear unremarkable at this time. There are no open sores, lesions.  Vascular: Dorsalis Pedis artery and Posterior Tibial artery pedal pulses palpable. No lower extremity edema noted.   Neruologic: Grossly intact via light touch bilateral.  Musculoskeletal: Muscular strength within normal limits in all groups bilateral. Normal range of motion noted to all pedal and ankle joints.   Assesement: #1 Paronychia with ingrowing nail medial border of the left great toe #2 Pain in toe #3 Incurvated nail  Plan of Care:  1. Patient evaluated.  2. Discussed treatment alternatives and plan of care. Explained nail avulsion procedure and post procedure course to patient. 3. Patient opted for permanent partial nail avulsion.  4. Prior to procedure, local anesthesia infiltration utilized using 3 ml of a 50:50 mixture of 2% plain lidocaine and 0.5% plain marcaine in a normal hallux block fashion and a betadine prep performed.  5. Partial permanent nail avulsion with chemical matrixectomy performed using [id number] applications of phenol followed by alcohol flush.  6. Light dressing applied. 7. Return to clinic in 2 weeks.   Edrick Kins, DPM [facility]  Dr. Edrick Kins, [place]                                        [place],   [address]                Office [phone]  Fax [phone]

## 2016-10-11 ENCOUNTER — Ambulatory Visit (INDEPENDENT_AMBULATORY_CARE_PROVIDER_SITE_OTHER): Payer: Medicare Other | Admitting: Podiatry

## 2016-10-11 DIAGNOSIS — S91109D Unspecified open wound of unspecified toe(s) without damage to nail, subsequent encounter: Secondary | ICD-10-CM | POA: Diagnosis not present

## 2016-10-11 DIAGNOSIS — M79676 Pain in unspecified toe(s): Secondary | ICD-10-CM | POA: Diagnosis not present

## 2016-10-11 DIAGNOSIS — S91209D Unspecified open wound of unspecified toe(s) with damage to nail, subsequent encounter: Secondary | ICD-10-CM | POA: Diagnosis not present

## 2016-10-11 NOTE — Progress Notes (Signed)
   Subjective: Patient presents today 2 weeks post ingrown nail permanent nail avulsion procedure. Patient states that the toe and nail fold is feeling much better.  Objective: Skin is warm, dry and supple. Nail and respective nail fold appears to be healing appropriately. Open wound to the associated nail fold with a granular wound base and moderate amount of fibrotic tissue. Minimal drainage noted. Mild erythema around the periungual region likely due to phenol chemical matricectomy.  Assessment: #1 postop permanent partial nail avulsion left great toe medial border #2 open wound periungual nail fold of respective digit.   Plan of care: #1 patient was evaluated  #2 debridement of open wound was performed to the periungual border of the respective toe using a currette. Antibiotic ointment and Band-Aid was applied. #3 patient is to return to clinic on a PRN  basis.   Edrick Kins, DPM [facility]  Dr. Edrick Kins, [address]                                        [place], [place] [address]                Office [phone]  Fax [phone]

## 2016-10-22 ENCOUNTER — Other Ambulatory Visit (HOSPITAL_COMMUNITY): Payer: Self-pay | Admitting: Cardiology

## 2016-10-27 ENCOUNTER — Encounter: Payer: Self-pay | Admitting: Neurology

## 2016-10-27 ENCOUNTER — Ambulatory Visit (INDEPENDENT_AMBULATORY_CARE_PROVIDER_SITE_OTHER): Payer: Medicare Other | Admitting: Neurology

## 2016-10-27 VITALS — BP 130/70 | HR 70 | Wt 165.0 lb

## 2016-10-27 DIAGNOSIS — I6990 Unspecified sequelae of unspecified cerebrovascular disease: Secondary | ICD-10-CM | POA: Diagnosis not present

## 2016-10-27 NOTE — Patient Instructions (Signed)
I had a long d/w patient about her remote stroke, atrial fibrillation, CHF,risk for recurrent stroke/TIAs, personally independently reviewed imaging studies and stroke evaluation results and answered questions.Continue Eliquis (apixaban) daily  for secondary stroke prevention and maintain strict control of hypertension with blood pressure goal below 130/90, diabetes with hemoglobin A1c goal below 6.5% and lipids with LDL cholesterol goal below 70 mg/dL. I also advised the patient to eat a healthy diet with plenty of whole grains, cereals, fruits and vegetables, exercise regularly and maintain ideal body weight Followup in the future with my nurse practitioner in 6 months or call earlier if necessary

## 2016-10-27 NOTE — Progress Notes (Signed)
STROKE NEUROLOGY FOLLOW UP NOTE  NAME: Kim Mullins DOB: 07/04/1930  REASON FOR VISIT: stroke follow up HISTORY FROM: pt and chart  Today we had the pleasure of seeing Kim Mullins in follow-up at our Neurology Clinic. Pt was accompanied by no one.   History Summary Kim Mullins is a 83 y.o. female with history of chronic systolic CHF/nonischemic cardiomyopathy, complete AVB s/p pacemaker placement, paroxysmal atrial fibrillation on eliquis 2.5mg  bid, history of CHF last EF 15-20% in 01/2016 and OSA on CPAP was admitted on 04/12/16 for visual field cut. Symptoms resolved in 1-2 hours. She also had transient slurred speech one week PTA in [place]. Had old stroke in 2016 with flashing lights all over the visual field, following with Dr. Leonie Man in clinic. During admission, noticed patient BP at 90s on multiple BP meds, had cardiology consult and hold off all BP meds. Also found that her eliquis was under dosed, and then increased to 5mg  bid. Her symptoms could be due to hypoperfusion vs. eliquis underdosing.  CTA head and neck, CUS, TTE unremarkable. EF 40-45%. LDL 46 and A1C 5.7. She was discharged in good condition.  During the interval time, the patient has been doing well.  No recurrent stroke like symptoms. On eliquis 5mg  bid without bleeding side effects. However, her BP today 94/64, still low and she was put back on coreg, lasix, lisinopril and spironolactone by cardiology for fluid overload on 05/21/16. She missed cardiology follow up on 06/21/16. Currently asymptomatic. [place].   Update 10/27/2016 ; she returns for follow-up after last visit 4 months ago by Dr. Erlinda Hong. She continues to do well without recurrent stroke or TIA symptoms. She remains on eliquis which is tolerating well with only minor bruising and no bleeding episodes. She continues to see a cardiologist Dr. Aundra Dubin for congestive heart failure which appears to be adequately treated.  She denies any shortness of breath. She has recently finished cardiac rehabilitation after 38 weeks. She denies any new health problems. She does use a cane consistently and has not had any falls or injuries. She in fact the ventricles and ankle [place] for Christmas and had a great time. States her blood pressure is well controlled and today it is 130/70. She has no new neurological complaints today  REVIEW OF SYSTEMS: Full 14 system review of systems performed and notable only for those listed below and in HPI above,   Eye itching and redness, ringing in the ears, runny nose, shortness of breath, allergies, joint pain, apnea and all other systems negative  Neurologic Examination    Blood pressure 130/70, pulse 70, weight 165 lb (74.8 kg).  General - Well nourished, well developed, in no apparent distress.  Ophthalmologic - Sharp disc margins OU.   Cardiovascular - Regular rate and rhythm with no murmur.  Mental Status -  Level of arousal and orientation to time, place, and person were intact. Language including expression, naming, repetition, comprehension was assessed and found intact. Fund of Knowledge was assessed and was intact.  Cranial Nerves II - XII - II - Visual field intact OU. III, IV, VI - Extraocular movements intact. V - Facial sensation intact bilaterally. VII - Facial movement intact bilaterally. VIII - Hearing & vestibular intact bilaterally. X - Palate elevates symmetrically. XI - Chin turning & shoulder shrug intact bilaterally. XII - Tongue protrusion intact.  Motor Strength - The patient's strength was normal in all extremities and pronator drift was  absent.  Bulk was normal and fasciculations were absent.   Motor Tone - Muscle tone was assessed at the neck and appendages and was normal.  Reflexes - The patient's reflexes were 1+ in all extremities and she had no pathological reflexes.  Sensory - Light touch, temperature/pinprick, vibration and proprioception,  and Romberg testing were assessed and were normal.    Coordination - The patient had normal movements in the hands and feet with no ataxia or dysmetria.  Tremor was absent.  Gait and Station - The patient's transfers, posture, gait, station, and turns were observed as normal. Mild difficulty with tandem walking   Data reviewed: I personally reviewed the images and agree with the radiology interpretations.  Ct Head Code Stroke Wo Contrast 04/12/2016 1. Stable and normal for age Normal noncontrast CT appearance of the brain. 2. ASPECTS is 10.   Carotid Doppler   There is 1-39% bilateral ICA stenosis. Vertebral artery flow is antegrade.    2-D echo - Left ventricle: The cavity size was normal. There was moderate concentric hypertrophy. Systolic function was mildly to moderately reduced. The estimated ejection fraction was in the range of 40% to 45%. Diffuse hypokinesis. The study is not technically sufficient to allow evaluation of LV diastolic function. - Mitral valve: There was mild regurgitation. - Left atrium: The atrium was mildly dilated. - Right atrium: The atrium was mildly dilated. Impressions: - No cardiac source of emboli was indentified.  CTA head and neck  IMPRESSION: CTA NECK: No hemodynamically significant stenosis or acute vascular process. RIGHT retropectoral lymphadenopathy partially imaged. Recommend correlation with mammography on a nonemergent basis in dedicated follow-up. Patulous esophagus with air-fluid level presents aspiration risk. CTA HEAD: Negative.   Component     Latest Ref Rng & Units 04/13/2016  Cholesterol     0 - 200 mg/dL 90  Triglycerides     <150 mg/dL 69  HDL Cholesterol     >40 mg/dL 30 (L)  Total CHOL/HDL Ratio     RATIO 3.0  VLDL     0 - 40 mg/dL 14  LDL (calc)     0 - 99 mg/dL 46  Hemoglobin A1C     4.8 - 5.6 % 5.7 (H)  Mean Plasma Glucose     mg/dL 117    Assessment: 86 year with suspected left occipital infarct  in October 6314 of embolic etiology without identified source stated with IV TPA with excellent clinical recovery. Vascular risk factors of   HT, CHF and atrial fibrillation. Plan:  -I had a long d/w patient about her remote stroke, atrial fibrillation, CHF,risk for recurrent stroke/TIAs, personally independently reviewed imaging studies and stroke evaluation results and answered questions.Continue Eliquis (apixaban) daily  for secondary stroke prevention and maintain strict control of hypertension with blood pressure goal below 130/90, diabetes with hemoglobin A1c goal below 6.5% and lipids with LDL cholesterol goal below 70 mg/dL. I also advised the patient to eat a healthy diet with plenty of whole grains, cereals, fruits and vegetables, exercise regularly and maintain ideal body weight Followup in the future with my nurse practitioner in 6 months or call earlier if necessary I spent more than 25 minutes of face to face time with the patient. Greater than 50% of time was spent in counseling and coordination of care. About atrial fibrillation and stroke risk.  Antony Contras, MD [facility] [address] [place],  [address] [phone]

## 2016-10-28 NOTE — Congregational Nurse Program (Signed)
Congregational Nurse Program Note  Date of Encounter: 10/28/2016  Past Medical History: Past Medical History:  Diagnosis Date  . Anemia    occassionally  . Arthritis    all over.  . Asthma    Allergixc reaction to cats.  . Atrial fibrillation ([place])   . Bowel obstruction   . Chronic systolic CHF (congestive heart failure) (HCC)    EF 30-35% by cath, echo 2016 EF 50%  . Complete heart block (HCC)    a. s/p MDT CRTP pacemaker  . DCM (dilated cardiomyopathy) ([place]) 08/16/2014   normal coronary arteries on cath with EF 30-45%.  EF now 50% by echo 11/2014  . Diabetes mellitus 2006   Diet and exercise controlled.  Marland Kitchen GERD (gastroesophageal reflux disease)    occ  . Hypertension   . Left tibial fracture 2007  . OSA (obstructive sleep apnea) 10/23/2014   Moderate with AHI 21/hr  . Osteoarthritis of right shoulder region 06/26/2013  . Stroke [facility])     Encounter Details:     CNP Questionnaire - 10/28/16 1126      Patient Demographics   Is this a new or existing patient? Existing   Patient is considered a/an Not Applicable     Patient Assistance   Patient's financial/insurance status --  Medicare   Patient referred to apply for the following financial assistance Not Applicable   Food insecurities addressed Not Applicable   Transportation assistance No   Assistance securing medications No   Educational health offerings Exercise/physical activity;Safety     Encounter Details   Was an Emergency Department visit averted? No   Does patient have a medical provider? Yes   Patient referred to Not Applicable   Was a mental health screening completed? (GAINS tool) No   Does patient have dental issues? No   Since previous encounter, have you referred patient for abnormal blood pressure that resulted in a new diagnosis or medication change? No   Since previous encounter, have you referred patient for abnormal blood glucose that resulted in a new diagnosis or medication change? No     Attended chair yoga class.

## 2016-10-29 ENCOUNTER — Other Ambulatory Visit (HOSPITAL_COMMUNITY): Payer: Self-pay | Admitting: Cardiology

## 2016-11-01 ENCOUNTER — Other Ambulatory Visit (HOSPITAL_COMMUNITY): Payer: Self-pay | Admitting: Cardiology

## 2016-11-22 NOTE — Congregational Nurse Program (Signed)
Congregational Nurse Program Note  Date of Encounter: 11/22/2016  Past Medical History: Past Medical History:  Diagnosis Date  . Anemia    occassionally  . Arthritis    all over.  . Asthma    Allergixc reaction to cats.  . Atrial fibrillation ([place])   . Bowel obstruction   . Chronic systolic CHF (congestive heart failure) (HCC)    EF 30-35% by cath, echo 2016 EF 50%  . Complete heart block (HCC)    a. s/p MDT CRTP pacemaker  . DCM (dilated cardiomyopathy) ([employer]) 08/16/2014   normal coronary arteries on cath with EF 30-45%.  EF now 50% by echo 11/2014  . Diabetes mellitus 2006   Diet and exercise controlled.  Marland Kitchen GERD (gastroesophageal reflux disease)    occ  . Hypertension   . Left tibial fracture 2007  . OSA (obstructive sleep apnea) 10/23/2014   Moderate with AHI 21/hr  . Osteoarthritis of right shoulder region 06/26/2013  . Stroke [facility])     Encounter Details:     CNP Questionnaire - 11/22/16 1749      Patient Demographics   Is this a new or existing patient? Existing   Patient is considered a/an Not Applicable   Race African-American/Black     Patient Assistance   Location of Patient Assistance Not Applicable   Patient's financial/insurance status Medicare   Uninsured Patient ([place] [employer]) No   Patient referred to apply for the following financial assistance Not Applicable   Food insecurities addressed Not Applicable   Transportation assistance No   Assistance securing medications No   Educational health offerings Exercise/physical activity     Encounter Details   Primary purpose of visit Education/Health Concerns   Was an Emergency Department visit averted? No   Does patient have a medical provider? Yes   Patient referred to Not Applicable   Was a mental health screening completed? (GAINS tool) No   Does patient have dental issues? No   Does patient have vision issues? No   Does your patient have an abnormal blood pressure today? No   Since  previous encounter, have you referred patient for abnormal blood pressure that resulted in a new diagnosis or medication change? No   Does your patient have an abnormal blood glucose today? No   Since previous encounter, have you referred patient for abnormal blood glucose that resulted in a new diagnosis or medication change? No   Was there a life-saving intervention made? No     Attended chair yoga class

## 2016-12-03 ENCOUNTER — Other Ambulatory Visit (HOSPITAL_COMMUNITY): Payer: Self-pay | Admitting: Cardiology

## 2016-12-03 ENCOUNTER — Other Ambulatory Visit: Payer: Self-pay | Admitting: *Deleted

## 2016-12-03 MED ORDER — APIXABAN 5 MG PO TABS: 5.0000 mg | ORAL_TABLET | Freq: Two times a day (BID) | ORAL | 6 refills | Status: DC

## 2016-12-03 MED ORDER — LISINOPRIL 2.5 MG PO TABS: 2.5000 mg | ORAL_TABLET | Freq: Every evening | ORAL | 3 refills | Status: DC

## 2016-12-03 MED ORDER — SPIRONOLACTONE 25 MG PO TABS: 12.5000 mg | ORAL_TABLET | Freq: Every day | ORAL | 3 refills | Status: DC

## 2016-12-07 ENCOUNTER — Other Ambulatory Visit (HOSPITAL_COMMUNITY): Payer: Self-pay | Admitting: *Deleted

## 2016-12-07 MED ORDER — APIXABAN 5 MG PO TABS: 5.0000 mg | ORAL_TABLET | Freq: Two times a day (BID) | ORAL | 6 refills | Status: DC

## 2016-12-07 MED ORDER — SPIRONOLACTONE 25 MG PO TABS: 12.5000 mg | ORAL_TABLET | Freq: Every day | ORAL | 3 refills | Status: DC

## 2016-12-07 MED ORDER — CARVEDILOL 3.125 MG PO TABS: ORAL_TABLET | ORAL | 3 refills | Status: DC

## 2016-12-09 ENCOUNTER — Ambulatory Visit (HOSPITAL_COMMUNITY)
Admission: RE | Admit: 2016-12-09 | Discharge: 2016-12-09 | Disposition: A | Discharge status: Home / Self Care | Payer: Medicare Other | Source: Ambulatory Visit | Attending: Cardiology | Admitting: Cardiology

## 2016-12-09 ENCOUNTER — Encounter (HOSPITAL_COMMUNITY): Payer: Self-pay

## 2016-12-09 VITALS — BP 114/76 | HR 78 | Wt 160.0 lb

## 2016-12-09 DIAGNOSIS — Z8673 Personal history of transient ischemic attack (TIA), and cerebral infarction without residual deficits: Secondary | ICD-10-CM | POA: Diagnosis not present

## 2016-12-09 DIAGNOSIS — G4733 Obstructive sleep apnea (adult) (pediatric): Secondary | ICD-10-CM | POA: Diagnosis not present

## 2016-12-09 DIAGNOSIS — I442 Atrioventricular block, complete: Secondary | ICD-10-CM | POA: Insufficient documentation

## 2016-12-09 DIAGNOSIS — Z7901 Long term (current) use of anticoagulants: Secondary | ICD-10-CM | POA: Diagnosis not present

## 2016-12-09 DIAGNOSIS — K219 Gastro-esophageal reflux disease without esophagitis: Secondary | ICD-10-CM | POA: Diagnosis not present

## 2016-12-09 DIAGNOSIS — Z87891 Personal history of nicotine dependence: Secondary | ICD-10-CM | POA: Insufficient documentation

## 2016-12-09 DIAGNOSIS — Z950 Presence of cardiac pacemaker: Secondary | ICD-10-CM | POA: Diagnosis not present

## 2016-12-09 DIAGNOSIS — E119 Type 2 diabetes mellitus without complications: Secondary | ICD-10-CM | POA: Diagnosis not present

## 2016-12-09 DIAGNOSIS — I480 Paroxysmal atrial fibrillation: Secondary | ICD-10-CM | POA: Diagnosis not present

## 2016-12-09 DIAGNOSIS — Z79899 Other long term (current) drug therapy: Secondary | ICD-10-CM | POA: Insufficient documentation

## 2016-12-09 DIAGNOSIS — I429 Cardiomyopathy, unspecified: Secondary | ICD-10-CM | POA: Diagnosis not present

## 2016-12-09 DIAGNOSIS — I10 Essential (primary) hypertension: Secondary | ICD-10-CM | POA: Diagnosis not present

## 2016-12-09 DIAGNOSIS — I5022 Chronic systolic (congestive) heart failure: Secondary | ICD-10-CM

## 2016-12-09 DIAGNOSIS — J45909 Unspecified asthma, uncomplicated: Secondary | ICD-10-CM | POA: Diagnosis not present

## 2016-12-09 LAB — BASIC METABOLIC PANEL
Anion gap: 9 (ref 5–15)
BUN: 16 mg/dL (ref 6–20)
CHLORIDE: 102 mmol/L (ref 101–111)
CO2: 26 mmol/L (ref 22–32)
CREATININE: 1.14 mg/dL — AB (ref 0.44–1.00)
Calcium: 8.9 mg/dL (ref 8.9–10.3)
GFR calc Af Amer: 50 mL/min — ABNORMAL LOW (ref >60)
GFR calc non Af Amer: 43 mL/min — ABNORMAL LOW (ref >60)
GLUCOSE: 108 mg/dL — AB (ref 65–99)
Potassium: 3.9 mmol/L (ref 3.5–5.1)
SODIUM: 137 mmol/L (ref 135–145)

## 2016-12-09 LAB — CBC
HEMATOCRIT: 38.7 % (ref 36.0–46.0)
Hemoglobin: 12.4 g/dL (ref 12.0–15.0)
MCH: 28.9 pg (ref 26.0–34.0)
MCHC: 32 g/dL (ref 30.0–36.0)
MCV: 90.2 fL (ref 78.0–100.0)
Platelets: 270 10*3/uL (ref 150–400)
RBC: 4.29 MIL/uL (ref 3.87–5.11)
RDW: 13.4 % (ref 11.5–15.5)
WBC: 4.8 10*3/uL (ref 4.0–10.5)

## 2016-12-09 MED ORDER — CARVEDILOL 6.25 MG PO TABS: ORAL_TABLET | ORAL | 3 refills | Status: DC

## 2016-12-09 NOTE — Patient Instructions (Signed)
INCREASE Coreg to 6.25mg  twice daily.  Routine lab work today. Will notify you of abnormal results  Follow up and Echo with Dr.McLean in 3 months

## 2016-12-09 NOTE — Progress Notes (Signed)
Patient ID: Kim Mullins, female   DOB: 08-09-1930, 83 y.o.   MRN: [id number] PCP: Dr. Ayesha Rumpf Cardiology: Dr. Radford Pax HF Cardiology: Dr. Aundra Dubin  83 yo with history of chronic systolic CHF/nonischemic cardiomyopathy, prior complete heart block, OSA, and paroxysmal atrial fibrillation presents for CHF clinic evaluation.  She was diagnosed with a cardiomyopathy back in 2015.  She had right and left heart cath in 12/15, showing no significant coronary disease.  EF was in the 30-35% range.  She was admitted in 2/16 with symptomatic bradycardia/complete heart block and had Medtronic CRT-P placed.  In 4/16, EF was back up to 50-55%.  However, repeat echo in 6/17 showed EF down to 15-20% with RV dysfunction.    She was admitted in 8/17 with suspected TIA => dysarthria and blurred vision.  She had been on Eliquis 2.5 mg bid, this was increased to 5 mg bid.  Lisinopril was stopped and Coreg was decreased due to soft BP.  Echo was done that admission, showing improvement in EF to 40-45%.    Returns today for HF follow up. Today she is feeling well overall. No SOB with walking around stores, she went to the [facility] today and participates in the Senior games (plays croquet). Does yoga 2 times a week. Weights at home ranging 151-154 pounds. Taking all medications with compliance. Eats a low salt diet, drinking less than 2L a day. Denies dizziness, palpitations and orthopnea. Has not been using CPAP due to a recent runny nose. No hematochezia or melena.   Optivol: Volume status ok. Activity level 3-4 hours a day.   Labs (3/17): HCT 36.9 Labs (5/17): K 4.2, creatinine 0.98 Labs (7/17): K 4.7, creatinine 1.11, BNP 304 Labs (8/17): K 4, creatinine 0.96, hgb 10.3, LDL 46, M-spike on SPEP.  Labs (9/17): Urine IFE negative, K 4.2, creatinine 1.2, hgb 12 Labs (10/17): K 3.8, creatinine 0.99  PMH: 1. Type II diabetes: Diet-controlled.  2. GERD: s/p esophageal dilatation.  3. Chronic systolic CHF:  1st noted in 2015. Nonischemic cardiomyopathy.  - Echo (11/15) with EF 40-45%.  - LHC/RHC (12/15): Normal coronaries, EF 30-35%, mean RA 9, PA 44/16 mean 31, unable to obtain PCWP, CI 2.5.  - Developed CHB and had Medtronic CRT-P placed in 2/16. - Echo (4/16) with EF 50-55%.  - Echo (6/17): EF 15-20%, moderate LVh, restrictive diastolic function, mild MR, RV moderately dilated and moderately decreased in function, moderate TR, PASP 46 mmHg.  - Echo (8/17): EF 40-45%, diffuse hypokinesis, mild MR.  4. Complete heart block: Medtronic CRT-P in 2/16.  5. OSA: Uses CPAP 6. Asthma 7. Atrial fibrillation: Paroxysmal.  8. H/o CVA.  Possible TIA in 8/17.    - Carotid dopplers (8/17) with 1-39% BICA stenosis.   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   FH: No cardiac problems that she knows of.   ROS: All systems reviewed and negative except as per HPI.   Current Outpatient Prescriptions  Medication Sig Dispense Refill  . Acetaminophen (TYLENOL) 325 MG CAPS Take 325 mg by mouth daily as needed (for shoulder pain).     Marland Kitchen apixaban (ELIQUIS) 5 MG TABS tablet Take 1 tablet (5 mg total) by mouth 2 (two) times daily. 60 tablet 6  . Ascorbic Acid (VITAMIN C PO) Take 1 tablet by mouth daily. Reported on 12/19/2015    . Bisacodyl (LAXATIVE PO) Take 1 tablet by mouth daily  as needed (for constipation).     . CALCIUM PO Take 1 tablet by mouth daily.    . carvedilol (COREG) 3.125 MG tablet TAKE 1 TABLET BY MOUTH TWICE DAILY WITH A MEAL 60 tablet 3  . diclofenac sodium (VOLTAREN) 1 % GEL Apply 2 g topically 3 (three) times daily as needed (pain, inflammation). 100 g 0  . diphenhydramine-acetaminophen (TYLENOL PM) 25-500 MG TABS tablet Take 1 tablet by mouth at bedtime as needed (sleep).     . furosemide (LASIX) 40 MG tablet Take 1 tablet (40 mg total) by mouth 2 (two) times daily. 180 tablet 3  . glucose blood test strip Use to test  blood sugar twice daily (Accuchek Smartview) 100 each 0  . latanoprost (XALATAN) 0.005 % ophthalmic solution PLACE 1 GTT IN BOTH EYES HS  11  . lisinopril (PRINIVIL,ZESTRIL) 2.5 MG tablet Take 1 tablet (2.5 mg total) by mouth every evening. NEEDS OFFICE VISIT 30 tablet 3  . spironolactone (ALDACTONE) 25 MG tablet Take 0.5 tablets (12.5 mg total) by mouth at bedtime. 15 tablet 3  . timolol (TIMOPTIC) 0.5 % ophthalmic solution Place 1 drop into both eyes daily.   11   No current facility-administered medications for this encounter.    BP 114/76   Pulse 78   Wt 160 lb (72.6 kg)   SpO2 98%   BMI 28.34 kg/m  General: Elderly female in NAD.  Neck: no JVP , no thyromegaly or thyroid nodule.  Lungs: Clear to auscultation bilaterally CV: Nondisplaced PMI. Regular rate and rhythm. No S3/S4, no murmur.  No peripheral edema.  No carotid bruit.  Normal pedal pulses.  Abdomen: Soft, nontender, no hepatosplenomegaly, no distention.  Skin: Intact without lesions or rashes.  Neurologic: Alert and oriented x 3.  Psych: Normal affect. Extremities: No clubbing or cyanosis.  HEENT: Normal.   Assessment/Plan: 1. Chronic systolic CHF: Nonischemic cardiomyopathy, no coronary disease on cath in 12/15. Etiology uncertain => ?viral myocarditis.  She does not remember a family history of cardiomyopathy and has never drank ETOH heavily.  LV function initially improved in 2016, but EF back down to 15-20% with RV dysfunction on 6/17 echo.  Most recent echo in 8/17 actually showed improved EF to 40-45%.   - Increase Coreg to 6.25mg  BID.  - Continue spironolactone 12.5 daily - Continue lisinopril 2.5 mg  - Continue Lasix 40 mg bid.  - Abnormal SPEP => urine immunofixation normal, polyclonal gammopathy, not suggestive of AL amyloidosis.  - Has CRT device, cannot get MRI.  - Will get repeat Echo in 3 months.  2. Complete heart block: Medtronic CRT-P.  - Follows with Dr. Lovena Le.  3. Atrial fibrillation: Paroxysmal   - Had suspected TIA in 8/17, suspect that Eliquis was underdosed.  - Continue full dose Eliquis 5mg  BID.  4. OSA:  - encouraged her to use her CPAP    Follow up in 3 months with Echo with Dr. Aundra Dubin.   Arbutus Leas, NP-C  12/09/2016

## 2016-12-23 ENCOUNTER — Other Ambulatory Visit: Payer: Self-pay | Admitting: Internal Medicine

## 2017-01-06 ENCOUNTER — Encounter (HOSPITAL_COMMUNITY): Payer: Self-pay | Admitting: *Deleted

## 2017-01-06 ENCOUNTER — Emergency Department (HOSPITAL_COMMUNITY): Payer: Medicare Other

## 2017-01-06 ENCOUNTER — Emergency Department (HOSPITAL_COMMUNITY)
Admission: EM | Admit: 2017-01-06 | Discharge: 2017-01-07 | Disposition: A | Discharge status: Home / Self Care | Payer: Medicare Other | Attending: Emergency Medicine | Admitting: Emergency Medicine

## 2017-01-06 DIAGNOSIS — H538 Other visual disturbances: Secondary | ICD-10-CM | POA: Diagnosis present

## 2017-01-06 DIAGNOSIS — Z96641 Presence of right artificial hip joint: Secondary | ICD-10-CM | POA: Diagnosis not present

## 2017-01-06 DIAGNOSIS — I110 Hypertensive heart disease with heart failure: Secondary | ICD-10-CM | POA: Insufficient documentation

## 2017-01-06 DIAGNOSIS — E119 Type 2 diabetes mellitus without complications: Secondary | ICD-10-CM | POA: Insufficient documentation

## 2017-01-06 DIAGNOSIS — I5022 Chronic systolic (congestive) heart failure: Secondary | ICD-10-CM | POA: Diagnosis not present

## 2017-01-06 DIAGNOSIS — Z950 Presence of cardiac pacemaker: Secondary | ICD-10-CM | POA: Insufficient documentation

## 2017-01-06 DIAGNOSIS — Z79899 Other long term (current) drug therapy: Secondary | ICD-10-CM | POA: Diagnosis not present

## 2017-01-06 DIAGNOSIS — J45909 Unspecified asthma, uncomplicated: Secondary | ICD-10-CM | POA: Diagnosis not present

## 2017-01-06 DIAGNOSIS — Z8673 Personal history of transient ischemic attack (TIA), and cerebral infarction without residual deficits: Secondary | ICD-10-CM | POA: Insufficient documentation

## 2017-01-06 DIAGNOSIS — Z87891 Personal history of nicotine dependence: Secondary | ICD-10-CM | POA: Insufficient documentation

## 2017-01-06 DIAGNOSIS — G459 Transient cerebral ischemic attack, unspecified: Secondary | ICD-10-CM

## 2017-01-06 DIAGNOSIS — Z7901 Long term (current) use of anticoagulants: Secondary | ICD-10-CM | POA: Insufficient documentation

## 2017-01-06 LAB — I-STAT TROPONIN, ED: Troponin i, poc: 0.09 ng/mL (ref 0.00–0.08)

## 2017-01-06 LAB — COMPREHENSIVE METABOLIC PANEL WITH GFR
ALT: 14 U/L (ref 14–54)
AST: 34 U/L (ref 15–41)
Albumin: 3.6 g/dL (ref 3.5–5.0)
Alkaline Phosphatase: 51 U/L (ref 38–126)
Anion gap: 9 (ref 5–15)
BUN: 25 mg/dL — ABNORMAL HIGH (ref 6–20)
CO2: 25 mmol/L (ref 22–32)
Calcium: 8.9 mg/dL (ref 8.9–10.3)
Chloride: 101 mmol/L (ref 101–111)
Creatinine, Ser: 1.27 mg/dL — ABNORMAL HIGH (ref 0.44–1.00)
GFR calc Af Amer: 44 mL/min — ABNORMAL LOW
GFR calc non Af Amer: 38 mL/min — ABNORMAL LOW
Glucose, Bld: 122 mg/dL — ABNORMAL HIGH (ref 65–99)
Potassium: 4.4 mmol/L (ref 3.5–5.1)
Sodium: 135 mmol/L (ref 135–145)
Total Bilirubin: 0.6 mg/dL (ref 0.3–1.2)
Total Protein: 7.2 g/dL (ref 6.5–8.1)

## 2017-01-06 LAB — APTT: APTT: 39 s — AB (ref 24–36)

## 2017-01-06 LAB — PROTIME-INR
INR: 1.42
Prothrombin Time: 17.5 s — ABNORMAL HIGH (ref 11.4–15.2)

## 2017-01-06 LAB — I-STAT CHEM 8, ED
BUN: 27 mg/dL — AB (ref 6–20)
CHLORIDE: 99 mmol/L — AB (ref 101–111)
Calcium, Ion: 1.1 mmol/L — ABNORMAL LOW (ref 1.15–1.40)
Creatinine, Ser: 1.2 mg/dL — ABNORMAL HIGH (ref 0.44–1.00)
Glucose, Bld: 114 mg/dL — ABNORMAL HIGH (ref 65–99)
HEMATOCRIT: 37 % (ref 36.0–46.0)
Hemoglobin: 12.6 g/dL (ref 12.0–15.0)
Potassium: 4.5 mmol/L (ref 3.5–5.1)
Sodium: 137 mmol/L (ref 135–145)
TCO2: 26 mmol/L (ref 0–100)

## 2017-01-06 LAB — CBC
HCT: 37.2 % (ref 36.0–46.0)
Hemoglobin: 11.9 g/dL — ABNORMAL LOW (ref 12.0–15.0)
MCH: 29.4 pg (ref 26.0–34.0)
MCHC: 32 g/dL (ref 30.0–36.0)
MCV: 91.9 fL (ref 78.0–100.0)
Platelets: 210 K/uL (ref 150–400)
RBC: 4.05 MIL/uL (ref 3.87–5.11)
RDW: 14.4 % (ref 11.5–15.5)
WBC: 5 K/uL (ref 4.0–10.5)

## 2017-01-06 LAB — DIFFERENTIAL
BASOS ABS: 0 10*3/uL (ref 0.0–0.1)
BASOS PCT: 1 %
EOS ABS: 0.2 10*3/uL (ref 0.0–0.7)
Eosinophils Relative: 4 %
LYMPHS ABS: 1.6 10*3/uL (ref 0.7–4.0)
Lymphocytes Relative: 33 %
Monocytes Absolute: 0.2 10*3/uL (ref 0.1–1.0)
Monocytes Relative: 4 %
NEUTROS PCT: 58 %
Neutro Abs: 2.9 10*3/uL (ref 1.7–7.7)

## 2017-01-06 NOTE — ED Notes (Signed)
MD Jacki Cones, and Charge Rn [facility] aware of I-stat Troponin resulting to be .09

## 2017-01-06 NOTE — ED Triage Notes (Signed)
Pt c/o sudden onset blurred vision onset at 10pm while watching TV. Pt also reports unsteadiness while walking. Grips equal, no drift noted in extremities. Hx of previous stroke

## 2017-01-07 LAB — I-STAT CHEM 8, ED
BUN: 27 mg/dL — ABNORMAL HIGH (ref 6–20)
CREATININE: 1.3 mg/dL — AB (ref 0.44–1.00)
Calcium, Ion: 1.11 mmol/L — ABNORMAL LOW (ref 1.15–1.40)
Chloride: 100 mmol/L — ABNORMAL LOW (ref 101–111)
Glucose, Bld: 109 mg/dL — ABNORMAL HIGH (ref 65–99)
HEMATOCRIT: 36 % (ref 36.0–46.0)
Hemoglobin: 12.2 g/dL (ref 12.0–15.0)
POTASSIUM: 4.5 mmol/L (ref 3.5–5.1)
Sodium: 136 mmol/L (ref 135–145)
TCO2: 27 mmol/L (ref 0–100)

## 2017-01-07 LAB — CBG MONITORING, ED: Glucose-Capillary: 102 mg/dL — ABNORMAL HIGH (ref 65–99)

## 2017-01-07 LAB — I-STAT TROPONIN, ED: Troponin i, poc: 0.09 ng/mL (ref 0.00–0.08)

## 2017-01-07 NOTE — ED Provider Notes (Signed)
[facility] DEPT Provider Note   CSN: [id number] Arrival date & time: 01/06/17  2237  By signing my name below, I, Dolores Hoose, attest that this documentation has been prepared under the direction and in the presence of Varney Biles, MD . Electronically Signed: Dolores Hoose, Scribe. 01/07/2017. 12:41 AM.  History   Chief Complaint Chief Complaint  Patient presents with  . Blurred Vision   The history is provided by the patient. No language interpreter was used.    HPI Comments:  Kim Mullins is a 83 y.o. female with pmhx of stroke, DM, DCM, CHF, and HTN who presents to the Emergency Department complaining of sudden-onset, resolved, blurry vision onset 3 hours. Pt states she was watching television when she noticed her vision became blurry. She states she tried closing each eye to check if her symptoms were unilateral but did not notice any difference between eyes. Pt reports associated right-parietal headache, mild. She has not tried anything for her symptoms PTA. Denies any numbness, weakness, speech difficulty, dizziness or paraesthesia. Pt has had strokes with vision complains, so she decided to come to the ER.  Past Medical History:  Diagnosis Date  . Anemia    occassionally  . Arthritis    all over.  . Asthma    Allergixc reaction to cats.  . Atrial fibrillation ([place])   . Bowel obstruction ([place])   . Chronic systolic CHF (congestive heart failure) (HCC)    EF 30-35% by cath, echo 2016 EF 50%  . Complete heart block (HCC)    a. s/p MDT CRTP pacemaker  . DCM (dilated cardiomyopathy) ([place]) 08/16/2014   normal coronary arteries on cath with EF 30-45%.  EF now 50% by echo 11/2014  . Diabetes mellitus 2006   Diet and exercise controlled.  Marland Kitchen GERD (gastroesophageal reflux disease)    occ  . Hypertension   . Left tibial fracture 2007  . OSA (obstructive sleep apnea) 10/23/2014   Moderate with AHI 21/hr  . Osteoarthritis of right shoulder region 06/26/2013  .  Stroke [facility])     Patient Active Problem List   Diagnosis Date Noted  . Hypotension 04/14/2016  . Hypotension due to drugs   . Chronic anticoagulation   . TIA (transient ischemic attack) 04/12/2016  . UTI (lower urinary tract infection) 04/12/2016  . AKI (acute kidney injury) ([place]) 04/12/2016  . Atrial fibrillation ([place]) 09/12/2015  . Occipital infarction ([place])   . CVA (cerebral infarction) 06/18/2015  . Homonymous hemianopsia   . Pacemaker 01/21/2015  . OSA (obstructive sleep apnea) 10/23/2014  . Dizziness 10/21/2014  . Complete heart block ([place]) 10/06/2014  . Benign essential HTN 10/04/2014  . Chronic systolic CHF (congestive heart failure) ([place]) 10/04/2014  . Abnormal cardiovascular stress test 08/27/2014  . Pulmonary HTN ([place]) 08/16/2014  . DCM (dilated cardiomyopathy) ([place]) 08/16/2014  . Cough 07/13/2014  . Esophageal stricture 06/11/2014  . Nonspecific (abnormal) findings on radiological and other examination of gastrointestinal tract 05/29/2014  . Dyspnea 04/29/2014  . Osteoarthritis of right shoulder region 06/26/2013  . DJD (degenerative joint disease) of hip 11/16/2011    Class: Present on Admission    Past Surgical History:  Procedure Laterality Date  . ABDOMINAL HYSTERECTOMY  1969  . BI-VENTRICULAR PACEMAKER INSERTION N/A 10/07/2014   MDT CRTP implanted by Dr Lovena Le  . BRAVO [place] STUDY N/A 06/11/2014   Procedure: BRAVO [place] STUDY;  Surgeon: Inda Castle, MD;  Location: [facility] ENDOSCOPY;  Service: Endoscopy;  Laterality: N/A;  . [place]illard Anes  1984   Bilateral  . CARDIAC CATHETERIZATION     normal coronary arteries  . Carpal [place]  2004   Bilateral  . CATARACT EXTRACTION Right    early 2015  . corn removal  1999   Bilateral feet  . DILATION AND CURETTAGE OF UTERUS  1968  . ESOPHAGOGASTRODUODENOSCOPY (EGD) WITH PROPOFOL N/A 06/11/2014   Procedure: ESOPHAGOGASTRODUODENOSCOPY (EGD) WITH PROPOFOL;  Surgeon: Inda Castle, MD;  Location: [facility] ENDOSCOPY;  Service:  Endoscopy;  Laterality: N/A;  . [place]   Surgery to fix Retainal detachment, bilateral  . JOINT REPLACEMENT    . LEFT AND RIGHT HEART CATHETERIZATION WITH CORONARY ANGIOGRAM N/A 08/29/2014   Procedure: LEFT AND RIGHT HEART CATHETERIZATION WITH CORONARY ANGIOGRAM;  Surgeon: Peter M Martinique, MD;  Location: [facility] CATH LAB;  Service: Cardiovascular;  Laterality: N/A;  . MALONEY DILATION  06/11/2014   Procedure: Venia Minks DILATION;  Surgeon: Inda Castle, MD;  Location: [facility] ENDOSCOPY;  Service: Endoscopy;;  . PILONIDAL CYST EXCISION  1959  . TONSILLECTOMY  1942  . TOTAL HIP ARTHROPLASTY  11/16/2011   Procedure: TOTAL HIP ARTHROPLASTY ANTERIOR APPROACH;  Surgeon: Hessie Dibble, MD;  Location: [facility];  Service: Orthopedics;  Laterality: Right;  DEPUY  . TOTAL SHOULDER ARTHROPLASTY Right 06/26/2013   Procedure: TOTAL SHOULDER ARTHROPLASTY;  Surgeon: Johnny Bridge, MD;  Location: [facility];  Service: Orthopedics;  Laterality: Right;    OB History    No data available       Home Medications    Prior to Admission medications   Medication Sig Start Date End Date Taking? Authorizing Provider  Acetaminophen (TYLENOL) 325 MG CAPS Take 325 mg by mouth daily as needed (for shoulder pain).     [provider]  apixaban (ELIQUIS) 5 MG TABS tablet Take 1 tablet (5 mg total) by mouth 2 (two) times daily. 12/07/16   Larey Dresser, MD  Ascorbic Acid (VITAMIN C PO) Take 1 tablet by mouth daily. Reported on 12/19/2015    [provider]  Bisacodyl (LAXATIVE PO) Take 1 tablet by mouth daily as needed (for constipation).     [provider]  CALCIUM PO Take 1 tablet by mouth daily.    [provider]  carvedilol (COREG) 6.25 MG tablet TAKE 1 TABLET BY MOUTH TWICE DAILY WITH A MEAL 12/09/16   Jettie Booze E, NP  diclofenac sodium (VOLTAREN) 1 % GEL Apply 2 g topically 3 (three) times daily as needed (pain, inflammation). 04/16/16   Roxan Hockey, MD    diphenhydramine-acetaminophen (TYLENOL PM) 25-500 MG TABS tablet Take 1 tablet by mouth at bedtime as needed (sleep).     [provider]  furosemide (LASIX) 40 MG tablet Take 1 tablet (40 mg total) by mouth 2 (two) times daily. 05/21/16   Larey Dresser, MD  glucose blood test strip Use to test blood sugar twice daily ([employer] Smartview) 07/19/16   Larey Dresser, MD  latanoprost (XALATAN) 0.005 % ophthalmic solution PLACE 1 GTT IN BOTH EYES HS 10/20/16   [provider]  lisinopril (PRINIVIL,ZESTRIL) 2.5 MG tablet Take 1 tablet (2.5 mg total) by mouth every evening. NEEDS OFFICE VISIT 12/03/16 04/02/17  Larey Dresser, MD  spironolactone (ALDACTONE) 25 MG tablet Take 0.5 tablets (12.5 mg total) by mouth at bedtime. 12/07/16   Larey Dresser, MD  timolol (TIMOPTIC) 0.5 % ophthalmic solution Place 1 drop into both eyes daily.  07/29/15   [provider]  Family History Family History  Problem Relation Age of Onset  . Dementia Mother   . Coronary artery disease Mother   . Cancer Father        blood ? type  . Diabetes Maternal Grandfather   . Anuerysm Daughter        brain  . Colon cancer Neg Hx     Social History Social History  Substance Use Topics  . Smoking status: Former Smoker    Packs/day: 1.00    Years: 45.00    Types: Cigarettes    Quit date: 07/24/1978  . Smokeless tobacco: Never Used  . Alcohol use 4.2 oz/week    7 Glasses of wine per week     Comment: every day     Allergies   Hydrocodone; Percocet [oxycodone-acetaminophen]; Eggs or egg-derived products; Mercurial derivatives; Penicillins; and Quinine derivatives   Review of Systems Review of Systems All other systems reviewed and are negative for acute change except as noted in the HPI.  Physical Exam Updated Vital Signs BP 101/76 (BP Location: Right Arm)   Pulse 63   Temp 98.7 F (37.1 C)   Resp 18   Ht 5\' 3"  (1.6 m)   Wt 156 lb (70.8 kg)   SpO2 100%   BMI 27.63 kg/m    Physical Exam  Constitutional: She is oriented to person, place, and time. She appears well-developed and well-nourished. No distress.  HENT:  Head: Normocephalic and atraumatic.  Eyes: EOM are normal.  Neck: Normal range of motion.  Cardiovascular: Normal rate, regular rhythm and normal heart sounds.   Pulmonary/Chest: Effort normal and breath sounds normal.  Abdominal: Soft. She exhibits no distension. There is no tenderness.  Musculoskeletal: Normal range of motion.  Neurological: She is alert and oriented to person, place, and time.  CN II - XII intact. Upper extremity sensory and motor exam normal. +4/5 LE strength. No dysmetria.  Skin: Skin is warm and dry.  Psychiatric: She has a normal mood and affect. Judgment normal.  Nursing note and vitals reviewed.    ED Treatments / Results  DIAGNOSTIC STUDIES:  Oxygen Saturation is 100% on RA, normal by my interpretation.    COORDINATION OF CARE:  1:44 AM Discussed treatment plan with pt at bedside which includes eye exam and pt agreed to plan.  3:31 AM Placed consult with Neuro.   Labs (all labs ordered are listed, but only abnormal results are displayed) Labs Reviewed  PROTIME-INR - Abnormal; Notable for the following:       Result Value   Prothrombin Time 17.5 (*)    All other components within normal limits  APTT - Abnormal; Notable for the following:    aPTT 39 (*)    All other components within normal limits  CBC - Abnormal; Notable for the following:    Hemoglobin 11.9 (*)    All other components within normal limits  COMPREHENSIVE METABOLIC PANEL - Abnormal; Notable for the following:    Glucose, Bld 122 (*)    BUN 25 (*)    Creatinine, Ser 1.27 (*)    GFR calc non Af Amer 38 (*)    GFR calc Af Amer 44 (*)    All other components within normal limits  I-STAT TROPOININ, ED - Abnormal; Notable for the following:    Troponin i, poc 0.09 (*)    All other components within normal limits  CBG MONITORING, ED -  Abnormal; Notable for the following:    Glucose-Capillary 102 (*)  All other components within normal limits  I-STAT CHEM 8, ED - Abnormal; Notable for the following:    Chloride 99 (*)    BUN 27 (*)    Creatinine, Ser 1.20 (*)    Glucose, Bld 114 (*)    Calcium, Ion 1.10 (*)    All other components within normal limits  I-STAT CHEM 8, ED - Abnormal; Notable for the following:    Chloride 100 (*)    BUN 27 (*)    Creatinine, Ser 1.30 (*)    Glucose, Bld 109 (*)    Calcium, Ion 1.11 (*)    All other components within normal limits  I-STAT TROPOININ, ED - Abnormal; Notable for the following:    Troponin i, poc 0.09 (*)    All other components within normal limits  DIFFERENTIAL    EKG  EKG Interpretation  Date/Time:  Thursday Jan 06 2017 23:17:19 EDT Ventricular Rate:  61 PR Interval:    QRS Duration: 134 QT Interval:  510 QTC Calculation: 513 R Axis:   -34 Text Interpretation:  AV dual-paced rhythm Abnormal ECG No acute changes Confirmed by Varney Biles [phone]) on 01/07/2017 1:45:50 AM       Radiology Ct Head Wo Contrast  Result Date: 01/06/2017 CLINICAL DATA:  Sudden onset blurred vision.  History of stroke. EXAM: CT HEAD WITHOUT CONTRAST TECHNIQUE: Contiguous axial images were obtained from the base of the skull through the vertex without intravenous contrast. COMPARISON:  04/13/2016 FINDINGS: Brain: Mild cerebral atrophy. No ventricular dilatation. Patchy low-attenuation changes in the deep white matter likely to represent small vessel ischemia. No mass effect or midline shift. No abnormal extra-axial fluid collections. Gray-white matter junctions are distinct. Basal cisterns are not effaced. No acute intracranial hemorrhage. Vascular: Vascular calcifications are present. Skull: Normal. Negative for fracture or focal lesion. Sinuses/Orbits: No acute finding. Other: No significant change since previous study. IMPRESSION: No acute intracranial abnormalities. Mild chronic  atrophy and small vessel ischemic changes. Electronically Signed   By: Lucienne Capers M.D.   On: 01/06/2017 23:47    Procedures Procedures (including critical care time)  Medications Ordered in ED Medications - No data to display   Initial Impression / Assessment and Plan / ED Course  I have reviewed the triage vital signs and the nursing notes.  Pertinent labs & imaging results that were available during my care of the patient were reviewed by me and considered in my medical decision making (see chart for details).  Clinical Course as of Jan 07 646  Fri Jan 07, 2017  0400 Pt continues to have no blurred vision. Discussed case with Dr. Tamala Julian, Neurology. He reviewed the workup from the last admission for stroke, and informed me that if patient has resolution in her symptoms, then no further workup is indicated. We will monitor for a little longer in the ER and decide on the disposition.  [AN]  0639 Pt continues to do well and has no blurry vision. CT-A from 2017 reviewed. All of her artery were patent. I dont think a CT-Angio for this visit is needed and the risk doesn't outweigh the benefit. Will dc. Strict ER return precautions have been discussed, and patient is agreeing with the plan and is comfortable with the workup done and the recommendations from the ER.   [AN]  0643 Unchanged. Pt had no chest pain at any point. Troponin i, poc: (!!) 0.09 [AN]    Clinical Course User Index [AN] Varney Biles, MD  Pt comes in with cc of blurred vision. Pt reports that her blurry vision was on both side - which is not consistent with strokes, but she reports that her strokes affected both sides in the past. Her chart review reveals strokes that had resulted in visual field defects. PT has stroke risk factors. CT head ordered and basic labs ordered - I will review them. Pt also had mild trop elevation, but she denies any chest pain, dib.  Final Clinical Impressions(s) / ED  Diagnoses   Final diagnoses:  Blurred vision, bilateral  Transient cerebral ischemia, unspecified type    New Prescriptions New Prescriptions   No medications on file   I personally performed the services described in this documentation, which was scribed in my presence. The recorded information has been reviewed and is accurate.     Varney Biles, MD 01/07/17 [phone]

## 2017-01-07 NOTE — ED Notes (Signed)
Visual

## 2017-01-07 NOTE — ED Notes (Signed)
Patient states she is feeling better, walked with patient to bathroom, walks with a cane steady gait.

## 2017-01-07 NOTE — ED Notes (Signed)
Patient sleeping

## 2017-01-07 NOTE — Discharge Instructions (Signed)
We saw you in the ER for the blurred vision - which resolved while you were here. All the results in the ER are normal, labs and imaging. We are not sure what is causing your symptoms. The workup in the ER is not complete, and is limited to screening for life threatening and emergent conditions only, so please see a Neurologist for further care.

## 2017-01-10 ENCOUNTER — Encounter: Payer: Self-pay | Admitting: Podiatry

## 2017-01-10 ENCOUNTER — Ambulatory Visit (INDEPENDENT_AMBULATORY_CARE_PROVIDER_SITE_OTHER): Payer: Medicare Other | Admitting: Podiatry

## 2017-01-10 DIAGNOSIS — L600 Ingrowing nail: Secondary | ICD-10-CM | POA: Diagnosis not present

## 2017-01-10 NOTE — Progress Notes (Signed)
   Subjective: Patient presents today for evaluation of pain to the medial border of the left great toe. Patient is concerned for possible ingrown nail. Patient states that the pain has been present for a few weeks now. Wearing certain shoes exacerbates her pain. Patient presents today for further treatment and evaluation.  Objective:  General: Well developed, nourished, in no acute distress, alert and oriented x3   Dermatology: Skin is warm, dry and supple bilateral. Medial border of the right great toe appears to be erythematous with evidence of an ingrowing nail. Pain on palpation noted to the border of the nail fold. The remaining nails appear unremarkable at this time. There are no open sores, lesions.  Vascular: Dorsalis Pedis artery and Posterior Tibial artery pedal pulses palpable. No lower extremity edema noted.   Neruologic: Grossly intact via light touch bilateral.  Musculoskeletal: Muscular strength within normal limits in all groups bilateral. Normal range of motion noted to all pedal and ankle joints.   Assesement: #1 Paronychia with ingrowing nail medial border right great toe #2 Pain in toe #3 Incurvated nail  Plan of Care:  1. Patient evaluated.  2. Discussed treatment alternatives and plan of care. Explained nail avulsion procedure and post procedure course to patient. 3. Patient opted for permanent partial nail avulsion.  4. Prior to procedure, local anesthesia infiltration utilized using 3 ml of a 50:50 mixture of 2% plain lidocaine and 0.5% plain marcaine in a normal hallux block fashion and a betadine prep performed.  5. Partial permanent nail avulsion with chemical matrixectomy performed using [id number] applications of phenol followed by alcohol flush.  6. Light dressing applied. 7. Return to clinic in 2 weeks.   Edrick Kins, DPM [facility]  Dr. Edrick Kins, [address]                                        [place], [place]  [address]                Office [phone]  Fax [phone]

## 2017-01-10 NOTE — Patient Instructions (Signed)

## 2017-01-26 ENCOUNTER — Ambulatory Visit (INDEPENDENT_AMBULATORY_CARE_PROVIDER_SITE_OTHER): Payer: Medicare Other | Admitting: Podiatry

## 2017-01-26 DIAGNOSIS — M79676 Pain in unspecified toe(s): Secondary | ICD-10-CM | POA: Diagnosis not present

## 2017-01-26 DIAGNOSIS — S91109D Unspecified open wound of unspecified toe(s) without damage to nail, subsequent encounter: Secondary | ICD-10-CM

## 2017-01-26 DIAGNOSIS — S91209D Unspecified open wound of unspecified toe(s) with damage to nail, subsequent encounter: Secondary | ICD-10-CM

## 2017-01-27 NOTE — Progress Notes (Signed)
   Subjective: Patient presents today 2 weeks post ingrown nail permanent nail avulsion procedure of the medial border of the right great toe. Patient states that the toe and nail fold is feeling much better.  Objective: Skin is warm, dry and supple. Nail and respective nail fold appears to be healing appropriately. Open wound to the associated nail fold with a granular wound base and moderate amount of fibrotic tissue. Minimal drainage noted. Mild erythema around the periungual region likely due to phenol chemical matricectomy.  Assessment: #1 postop permanent partial nail avulsion medial border right great toe #2 open wound periungual nail fold of respective digit.   Plan of care: #1 patient was evaluated  #2 debridement of open wound was performed to the periungual border of the respective toe using a currette. Antibiotic ointment and Band-Aid was applied. #3 patient is to return to clinic on a PRN  basis.   Edrick Kins, DPM [facility]  Dr. Edrick Kins, [address]                                        [place], [place] [address]                Office [phone]  Fax [phone]

## 2017-02-01 NOTE — Congregational Nurse Program (Signed)
Congregational Nurse Program Note  Date of Encounter: 02/01/2017  Past Medical History: No past medical history on file.  Encounter Details:     CNP Questionnaire - 02/01/17 0200      Patient Demographics   Is this a new or existing patient? Existing   Patient is considered a/an Not Applicable   Race African-American/Black     Patient Assistance   Location of Patient Assistance Not Applicable   Patient's financial/insurance status Medicare   Uninsured Patient ([place]/Care Connects) No   Patient referred to apply for the following financial assistance Not Applicable   Food insecurities addressed Not Applicable   Transportation assistance No   Assistance securing medications No   Educational health offerings Exercise/physical activity     Encounter Details   Primary purpose of visit Spiritual Care/Support Visit   Was an Emergency Department visit averted? Not Applicable   Does patient have a medical provider? Yes   Patient referred to Not Applicable   Was a mental health screening completed? (GAINS tool) No   Does patient have dental issues? No   Does patient have vision issues? No   Does your patient have an abnormal blood pressure today? No   Since previous encounter, have you referred patient for abnormal blood pressure that resulted in a new diagnosis or medication change? No   Does your patient have an abnormal blood glucose today? No   Since previous encounter, have you referred patient for abnormal blood glucose that resulted in a new diagnosis or medication change? No   Was there a life-saving intervention made? No    Attended chair yoga class.

## 2017-03-31 NOTE — Congregational Nurse Program (Signed)
Congregational Nurse Program Note  Date of Encounter: 03/31/2017  Past Medical History: Past Medical History:  Diagnosis Date  . Anemia    occassionally  . Arthritis    all over.  . Asthma    Allergixc reaction to cats.  . Atrial fibrillation ([place])   . Bowel obstruction ([place])   . Chronic systolic CHF (congestive heart failure) (HCC)    EF 30-35% by cath, echo 2016 EF 50%  . Complete heart block (HCC)    a. s/p MDT CRTP pacemaker  . DCM (dilated cardiomyopathy) ([place]) 08/16/2014   normal coronary arteries on cath with EF 30-45%.  EF now 50% by echo 11/2014  . Diabetes mellitus 2006   Diet and exercise controlled.  Marland Kitchen GERD (gastroesophageal reflux disease)    occ  . Hypertension   . Left tibial fracture 2007  . OSA (obstructive sleep apnea) 10/23/2014   Moderate with AHI 21/hr  . Osteoarthritis of right shoulder region 06/26/2013  . Stroke [facility])     Encounter Details:     CNP Questionnaire - 03/31/17 1350      Patient Demographics   Is this a new or existing patient? Existing   Patient is considered a/an Not Applicable   Race African-American/Black     Patient Assistance   Location of Patient Assistance Not Applicable   Patient's financial/insurance status Medicare   Uninsured Patient ([place]/Care Connects) No   Patient referred to apply for the following financial assistance Not Applicable   Food insecurities addressed Not Applicable   Transportation assistance No   Assistance securing medications No   Educational health offerings Exercise/physical activity     Encounter Details   Primary purpose of visit Education/Health Concerns   Was an Emergency Department visit averted? No   Does patient have a medical provider? Yes   Patient referred to Not Applicable   Was a mental health screening completed? (GAINS tool) No   Does patient have dental issues? No   Does patient have vision issues? No   Does your patient have an abnormal blood pressure today? No   Since previous encounter, have you referred patient for abnormal blood pressure that resulted in a new diagnosis or medication change? No   Does your patient have an abnormal blood glucose today? No   Since previous encounter, have you referred patient for abnormal blood glucose that resulted in a new diagnosis or medication change? No   Was there a life-saving intervention made? No    Attended chair yoga class.

## 2017-04-15 IMAGING — CT CT HEAD CODE STROKE
3 of 4 series · 18 of 47 positions shown, 21 images · non-contrast
Comparison: Head CT without contrast 11/23/2015, and earlier.

ADDENDUM:
Study discussed by telephone with Dr. AMADA CLASS on
04/12/2016 at 8701 hours.
CLINICAL DATA: Code stroke. 85-year-old female with vision loss.
Initial encounter.

EXAM:
CT HEAD WITHOUT CONTRAST
TECHNIQUE: Contiguous axial images were obtained from the base of the skull
through the vertex without intravenous contrast.

[Series 201: head w/o, idose (1) · axial · non-contrast · 0.43mm/px · z∈[+78,+193]mm · 12 of 27 slices shown, 15 images]
[im 2/27  brain]
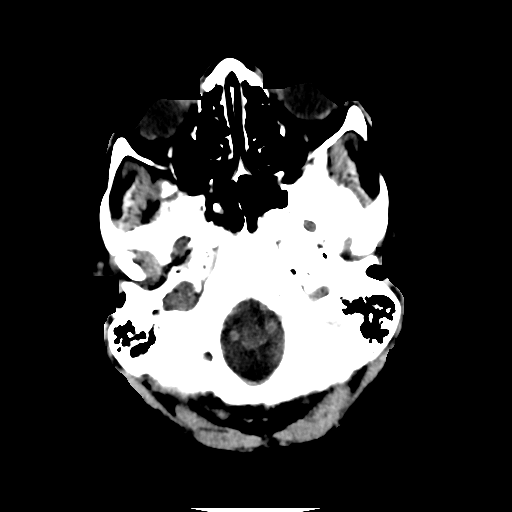
[im 2/27  bone]
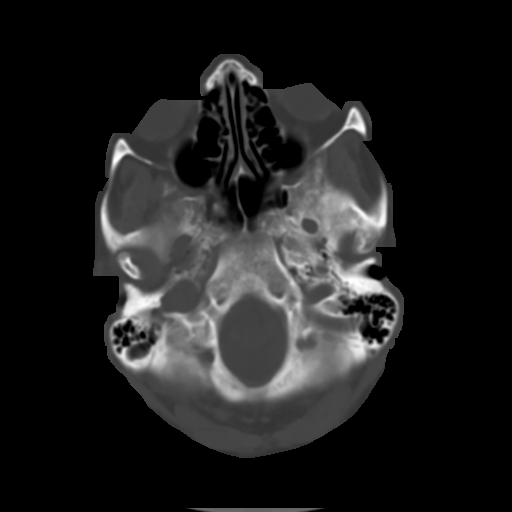
[im 4/27  brain]
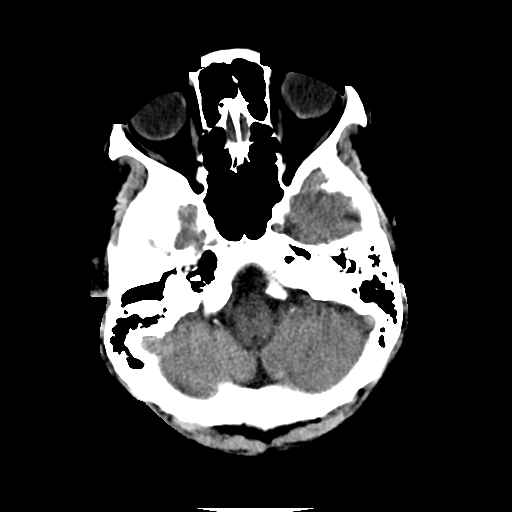
[im 6/27  brain]
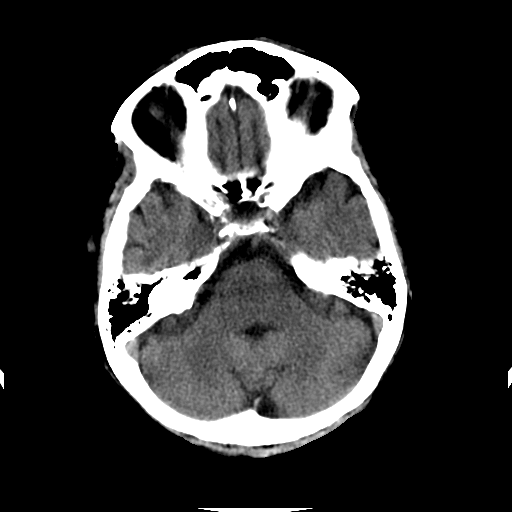
[im 8/27  brain]
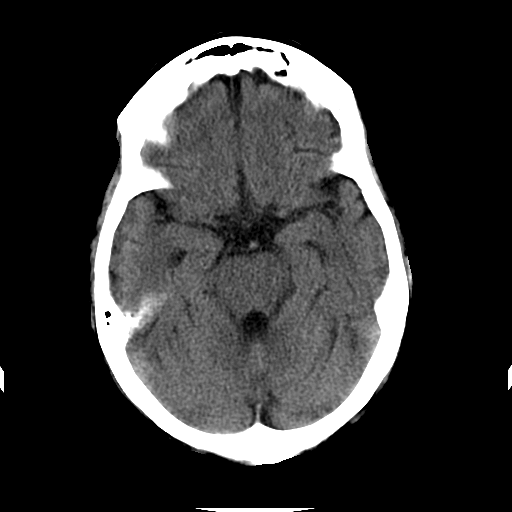
[im 10/27  brain]
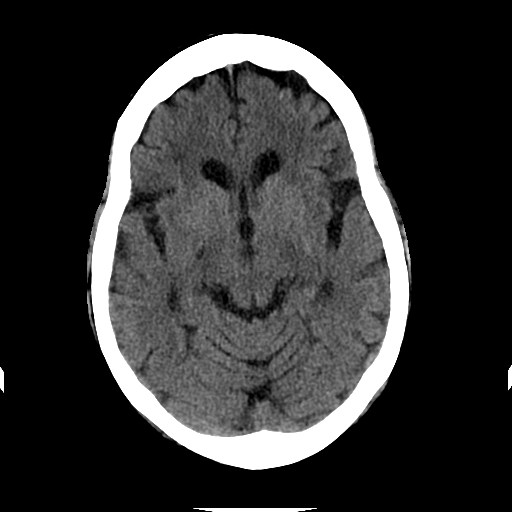
[im 10/27  bone]
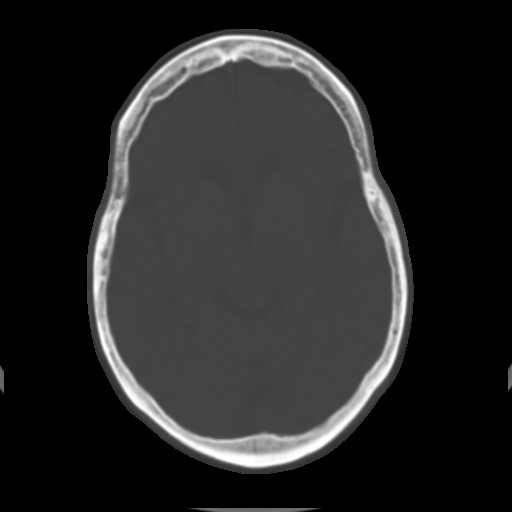
[im 12/27  brain]
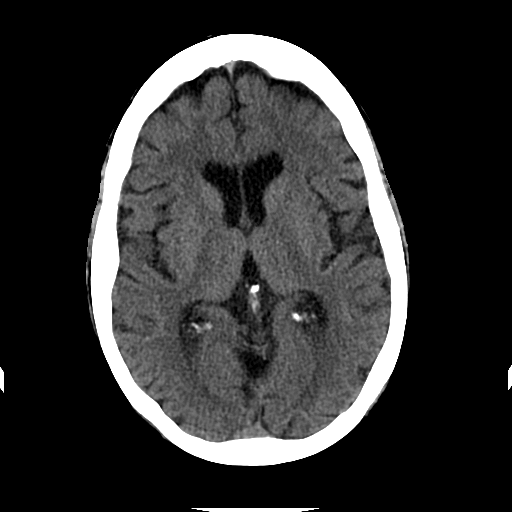
[im 15/27  brain]
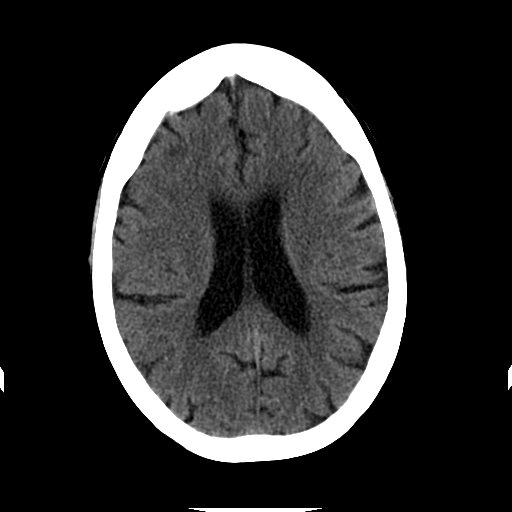
[im 17/27  brain]
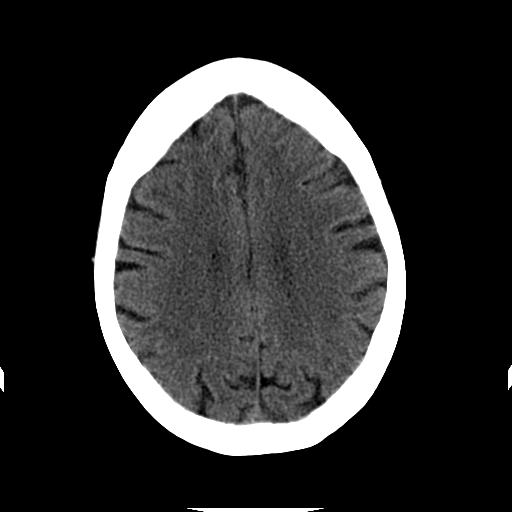
[im 19/27  brain]
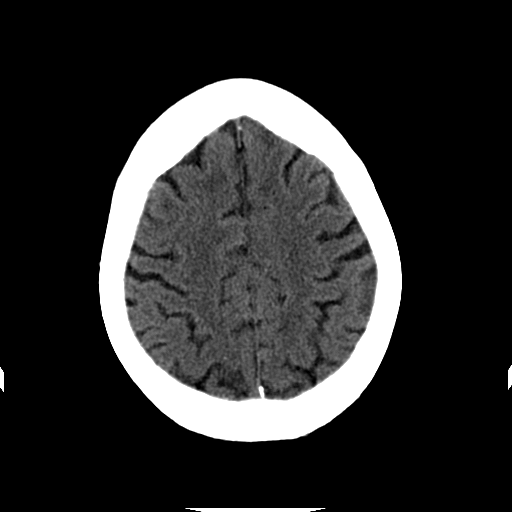
[im 19/27  bone]
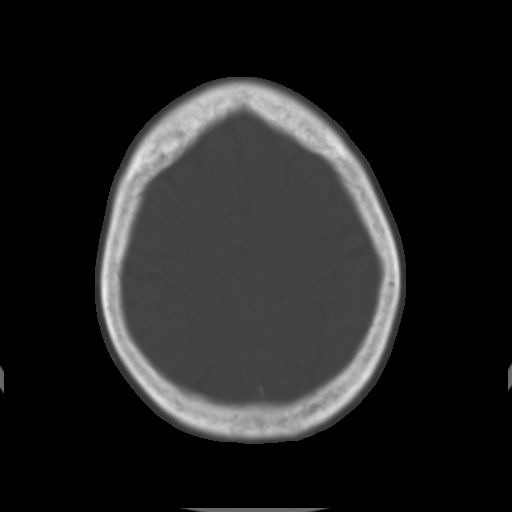
[im 21/27  brain]
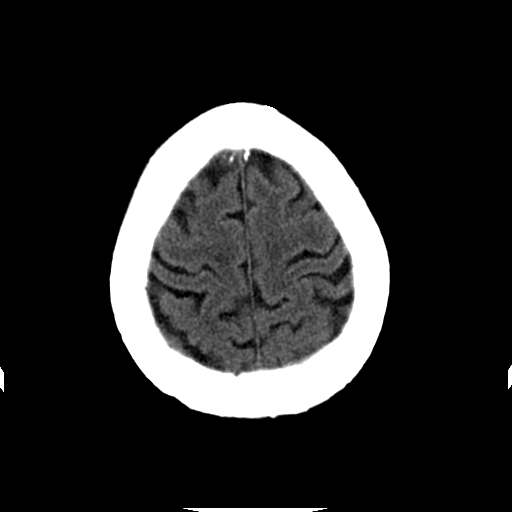
[im 23/27  brain]
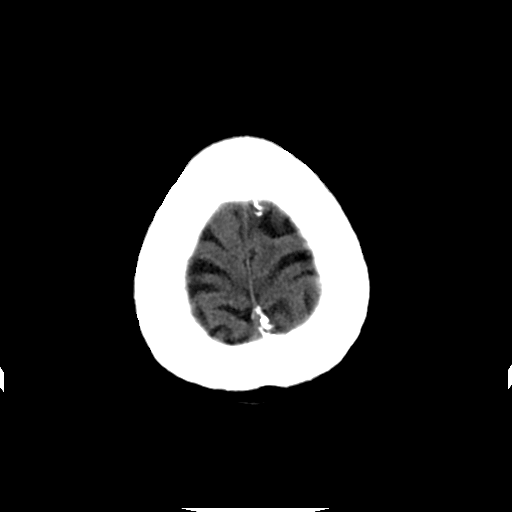
[im 25/27  brain]
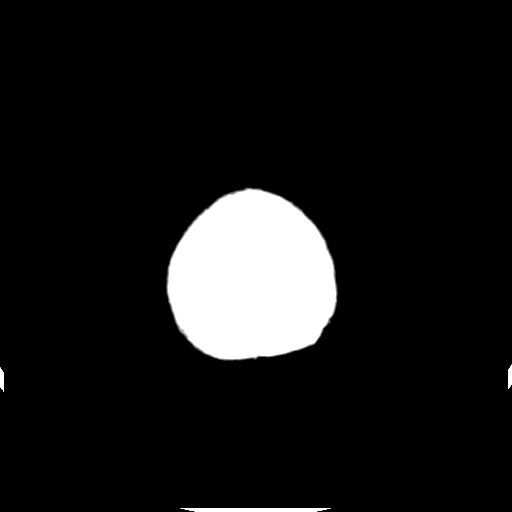

[Series 203: coronal st, idose (1) · coronal · 0.40mm/px · 3 of 70 slices shown]
[im 24/70  brain]
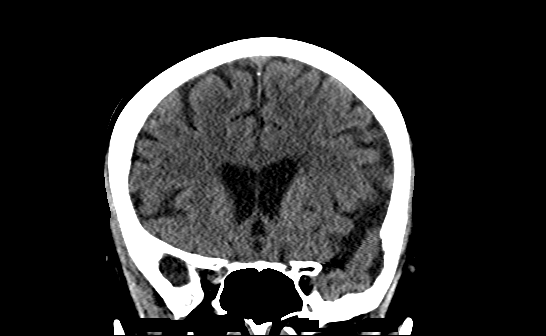
[im 31/70  brain]
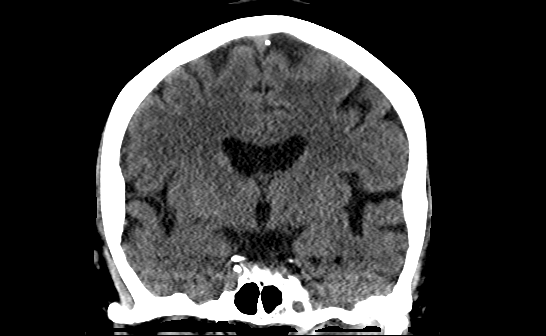
[im 39/70  brain]
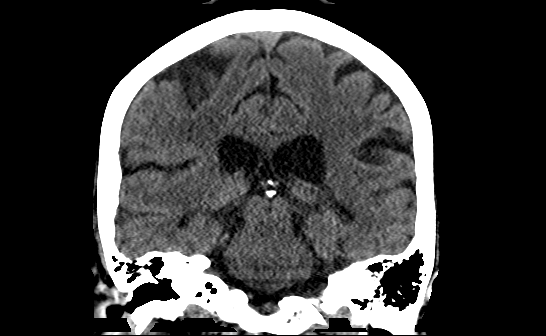

[Series 204: sagittal st, idose (1) · sagittal · 0.40mm/px · 3 of 73 slices shown]
[im 25/73  brain]
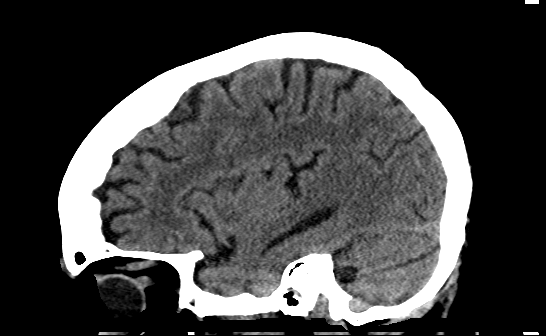
[im 37/73  brain]
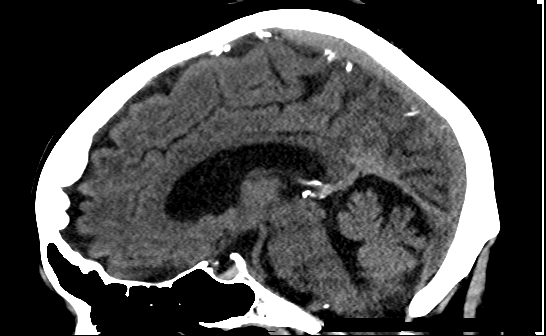
[im 49/73  brain]
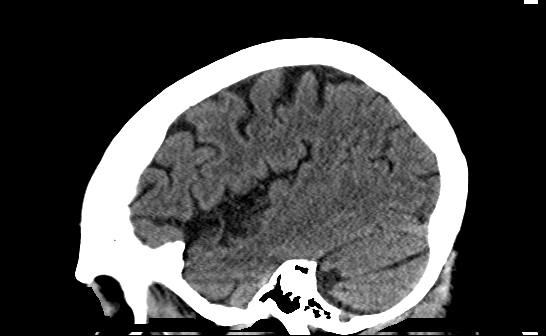

[18 of 47 positions shown; findings below may reference images not displayed]

FINDINGS: Visualized paranasal sinuses and mastoids are stable and well
pneumatized. No acute osseous abnormality identified. Stable and
negative visualized orbits soft tissues. Visualized scalp soft
tissues are within normal limits. Calcified atherosclerosis at the
skull base.

Cerebral volume remains normal for age. No midline shift,
ventriculomegaly, mass effect, evidence of mass lesion, intracranial
hemorrhage or evidence of cortically based acute infarction.
Gray-white matter differentiation is within normal limits throughout
the brain. No suspicious intracranial vascular hyperdensity.

ASPECTS ([place] Stroke Program Early CT Score)

Total score (0-10 with 10 being normal): 10
IMPRESSION: 1. Stable and normal for age Normal noncontrast CT appearance of the
brain.
2. ASPECTS is 10.

## 2017-05-02 NOTE — Progress Notes (Signed)
[facility]  PATIENT: Kim Mullins DOB: [birth year]   REASON FOR VISIT: follow up for stroke 06/12/2016 HISTORY FROM:patient     HISTORY OF PRESENT ILLNESS: 06/29/16 Dr. Madie Mullins.Kim A Wilsonis a 83 y.o.femalewith history of chronic systolic CHF/nonischemic cardiomyopathy, complete AVB s/p pacemaker placement, paroxysmal atrial fibrillation on eliquis 2.5mg  bid, history of CHF last EF 15-20% in 01/2016 and OSA on CPAP was admitted on 04/12/16 for visual field cut. Symptoms resolved in 1-2 hours. She also had transient slurred speech one week PTA in [place]. Had old stroke in 2016 with flashing lights all over the visual field, following with Dr. Leonie Mullins in clinic. During admission, noticed patient BP at 90s on multiple BP meds, had cardiology consult and hold off all BP meds. Also found that her eliquis was under dosed, and then increased to 5mg  bid. Her symptoms could be due to hypoperfusion vs. eliquis underdosing.  CTA head and neck, CUS, TTE unremarkable. EF 40-45%. LDL 46 and A1C 5.7. She was discharged in good condition.  During the interval time, the patient has been doing well.  No recurrent stroke like symptoms. On eliquis 5mg  bid without bleeding side effects. However, her BP today 94/64, still low and she was put back on coreg, lasix, lisinopril and spironolactone by cardiology for fluid overload on 05/21/16. She missed cardiology follow up on 06/21/16. Currently asymptomatic. [place].   Update 10/27/2016 Dr. Leonie Mullins; she returns for follow-up after last visit 4 months ago by Dr. Erlinda Mullins. She continues to do well without recurrent stroke or TIA symptoms. She remains on eliquis which is tolerating well with only minor bruising and no bleeding episodes. She continues to see a cardiologist Dr. Aundra Mullins for congestive heart failure which appears to be adequately treated. She denies any shortness of breath. She has recently finished cardiac  rehabilitation after 38 weeks. She denies any new health problems. She does use a cane consistently and has not had any falls or injuries.  States her blood pressure is well controlled and today it is 130/70. She has no new neurological complaints today UPDATE 05/03/17 CM Kim Mullins, 83 year old female returns for follow-up with history of stroke in Oct 2017. She remains on eliquis for secondary stroke prevention and atrial fibrillation. She has minor bruising and no bleeding. Blood pressure the office today 101/68. She ambulates with either a cane or a rolling walker depending on how far she has to ambulate. She does do yoga class once a week. She remains very active continues to drive without difficulty. Dr. Aundra Mullins is her cardiologist who she sees for congestive heart failure which appears adequately treated she denies any shortness of breath. She does complain with some itching for several weeks and is due to see her primary care tomorrow regarding this. She returns for reevaluation  REVIEW OF SYSTEMS: Full 14 system review of systems performed and notable only for those listed, all others are neg:  Constitutional: neg  Cardiovascular: History of congestive heart failure Ear/Nose/Throat: Hearing loss  Skin: neg Eyes: neg Respiratory: neg Gastroitestinal: neg  Hematology/Lymphatic: neg  Endocrine: neg Musculoskeletal: Joint pain Allergy/Immunology: neg Neurological: neg Psychiatric: neg Sleep : neg   ALLERGIES: Hydrocodone Percocet Penicillin, quinine derivatives, mercurial derivatives   HOME MEDICATIONS: Outpatient Medications Prior to Visit  Medication Sig Dispense Refill  . Acetaminophen (TYLENOL) 325 MG CAPS Take 325 mg by mouth daily as needed (for shoulder pain).     Marland Kitchen apixaban (ELIQUIS) 5 MG TABS tablet Take  1 tablet (5 mg total) by mouth 2 (two) times daily. 60 tablet 6  . Ascorbic Acid (VITAMIN C PO) Take 1 tablet by mouth daily. Reported on 12/19/2015    . Bisacodyl (LAXATIVE  PO) Take 1 tablet by mouth daily as needed (for constipation).     . CALCIUM PO Take 1 tablet by mouth daily.    . carvedilol (COREG) 6.25 MG tablet TAKE 1 TABLET BY MOUTH TWICE DAILY WITH A MEAL 60 tablet 3  . diclofenac sodium (VOLTAREN) 1 % GEL Apply 2 g topically 3 (three) times daily as needed (pain, inflammation). 100 g 0  . diphenhydramine-acetaminophen (TYLENOL PM) 25-500 MG TABS tablet Take 1 tablet by mouth at bedtime as needed (sleep).     . furosemide (LASIX) 40 MG tablet Take 1 tablet (40 mg total) by mouth 2 (two) times daily. 180 tablet 3  . glucose blood test strip Use to test blood sugar twice daily (Accuchek Smartview) 100 each 0  . latanoprost (XALATAN) 0.005 % ophthalmic solution PLACE 1 GTT IN BOTH EYES HS  11  . spironolactone (ALDACTONE) 25 MG tablet Take 0.5 tablets (12.5 mg total) by mouth at bedtime. 15 tablet 3  . timolol (TIMOPTIC) 0.5 % ophthalmic solution Place 1 drop into both eyes daily.   11  . lisinopril (PRINIVIL,ZESTRIL) 2.5 MG tablet Take 1 tablet (2.5 mg total) by mouth every evening. NEEDS OFFICE VISIT 30 tablet 3   No facility-administered medications prior to visit.     PAST MEDICAL HISTORY: Past Medical History:  Diagnosis Date  . Anemia    occassionally  . Arthritis    all over.  . Asthma    Allergixc reaction to cats.  . Atrial fibrillation ([place])   . Bowel obstruction ([place])   . Chronic systolic CHF (congestive heart failure) (HCC)    EF 30-35% by cath, echo 2016 EF 50%  . Complete heart block (HCC)    a. s/p MDT CRTP pacemaker  . DCM (dilated cardiomyopathy) ([place]) 08/16/2014   normal coronary arteries on cath with EF 30-45%.  EF now 50% by echo 11/2014  . Diabetes mellitus 2006   Diet and exercise controlled.  Marland Kitchen GERD (gastroesophageal reflux disease)    occ  . Hypertension   . Left tibial fracture 2007  . OSA (obstructive sleep apnea) 10/23/2014   Moderate with AHI 21/hr  . Osteoarthritis of right shoulder region 06/26/2013  . Stroke  [facility]     PAST SURGICAL HISTORY: Past Surgical History:  Procedure Laterality Date  . ABDOMINAL HYSTERECTOMY  1969  . BI-VENTRICULAR PACEMAKER INSERTION N/A 10/07/2014   MDT CRTP implanted by Dr Lovena Le  . BRAVO [place] STUDY N/A 06/11/2014   Procedure: BRAVO [place] STUDY;  Surgeon: Inda Castle, MD;  Location: [facility];  Service: Endoscopy;  Laterality: N/A;  . BUNIONECTOMY  1984   Bilateral  . CARDIAC CATHETERIZATION     normal coronary arteries  . Carpal [place]  2004   Bilateral  . CATARACT EXTRACTION Right    early 2015  . corn removal  1999   Bilateral feet  . DILATION AND CURETTAGE OF UTERUS  1968  . ESOPHAGOGASTRODUODENOSCOPY (EGD) WITH PROPOFOL N/A 06/11/2014   Procedure: ESOPHAGOGASTRODUODENOSCOPY (EGD) WITH PROPOFOL;  Surgeon: Inda Castle, MD;  Location: [facility];  Service: Endoscopy;  Laterality: N/A;  . [place]   Surgery to fix Retainal detachment, bilateral  . JOINT REPLACEMENT    . LEFT AND RIGHT HEART CATHETERIZATION WITH CORONARY  ANGIOGRAM N/A 08/29/2014   Procedure: LEFT AND RIGHT HEART CATHETERIZATION WITH CORONARY ANGIOGRAM;  Surgeon: Peter M Martinique, MD;  Location: [facility] CATH LAB;  Service: Cardiovascular;  Laterality: N/A;  . MALONEY DILATION  06/11/2014   Procedure: Venia Minks DILATION;  Surgeon: Inda Castle, MD;  Location: [facility];  Service: Endoscopy;;  . PILONIDAL CYST EXCISION  1959  . TONSILLECTOMY  1942  . TOTAL HIP ARTHROPLASTY  11/16/2011   Procedure: TOTAL HIP ARTHROPLASTY ANTERIOR APPROACH;  Surgeon: Hessie Dibble, MD;  Location: [place];  Service: Orthopedics;  Laterality: Right;  DEPUY  . TOTAL SHOULDER ARTHROPLASTY Right 06/26/2013   Procedure: TOTAL SHOULDER ARTHROPLASTY;  Surgeon: Johnny Bridge, MD;  Location: [place];  Service: Orthopedics;  Laterality: Right;    FAMILY HISTORY: Family History  Problem Relation Age of Onset  . Dementia Mother   . Coronary artery disease Mother   . Cancer Father        blood ? type  .  Diabetes Maternal Grandfather   . Anuerysm Daughter        brain  . Colon cancer Neg Hx     SOCIAL HISTORY: Social History   Social History  . Marital status: Widowed    Spouse name: N/A  . Number of children: 1  . Years of education: N/A   Occupational History  . retired    Social History Main Topics  . Smoking status: Former Smoker    Packs/day: 1.00    Years: 45.00    Types: Cigarettes    Quit date: 07/24/1978  . Smokeless tobacco: Never Used  . Alcohol use 4.2 oz/week    7 Glasses of wine per week     Comment: every day  . Drug use: No  . Sexual activity: Not Currently   Other Topics Concern  . Not on file   Social History Narrative   Lives alone     PHYSICAL EXAM  Vitals:   05/03/17 1435  BP: 101/68  Pulse: 89  Weight: 157 lb (71.2 kg)   Body mass index is 27.81 kg/m.  Generalized: Well developed, in no acute distress  Head: normocephalic and atraumatic,. Oropharynx benign  Neck: Supple, no carotid bruits  Cardiac: Regular rate rhythm, no murmur  Musculoskeletal: No deformity   Neurological examination   Mentation: Alert oriented to time, place, history taking. Attention span and concentration appropriate. Recent and remote memory intact.  Follows all commands speech and language fluent.   Cranial nerve II-XII: Pupils were equal round reactive to light extraocular movements were full, visual field were full on confrontational test. Facial sensation and strength were normal. hearing was intact to finger rubbing bilaterally. Uvula tongue midline. head turning and shoulder shrug were normal and symmetric.Tongue protrusion into cheek strength was normal. Motor: normal bulk and tone, full strength in the BUE, BLE, fine finger movements normal, no pronator drift. No focal weakness Sensory: normal and symmetric to light touch,  Coordination: finger-nose-finger, heel-to-shin bilaterally, no dysmetria Reflexes: 1+ upper lower and symmetric plantar responses  were flexor bilaterally. Gait and Station: Rising up from seated position without assistance, normal stance,  moderate stride, good arm swing, smooth turning, able to perform tiptoe, and heel walking without difficulty. Tandem gait is mildly unsteady  DIAGNOSTIC DATA (LABS, IMAGING, TESTING) - I reviewed patient records, labs, notes, testing and imaging myself where available.  Lab Results  Component Value Date   WBC 5.0 01/06/2017   HGB 12.2 01/07/2017   HCT 36.0 01/07/2017  MCV 91.9 01/06/2017   PLT 210 01/06/2017      Component Value Date/Time   NA 136 01/07/2017 0007   K 4.5 01/07/2017 0007   CL 100 (L) 01/07/2017 0007   CO2 25 01/06/2017 2309   GLUCOSE 109 (H) 01/07/2017 0007   BUN 27 (H) 01/07/2017 0007   CREATININE 1.30 (H) 01/07/2017 0007   CREATININE 0.98 (H) 12/29/2015 1057   CALCIUM 8.9 01/06/2017 2309   PROT 7.2 01/06/2017 2309   ALBUMIN 3.6 01/06/2017 2309   AST 34 01/06/2017 2309   ALT 14 01/06/2017 2309   ALKPHOS 51 01/06/2017 2309   BILITOT 0.6 01/06/2017 2309   GFRNONAA 38 (L) 01/06/2017 2309   GFRAA 44 (L) 01/06/2017 2309   Lab Results  Component Value Date   CHOL 90 04/13/2016   HDL 30 (L) 04/13/2016   LDLCALC 46 04/13/2016   TRIG 69 04/13/2016   CHOLHDL 3.0 04/13/2016   Lab Results  Component Value Date   HGBA1C 5.7 (H) 04/13/2016    Lab Results  Component Value Date   TSH 1.46 04/29/2014      ASSESSMENT AND PLAN 89 year with suspected left occipital infarct in October 7793 of embolic etiology without identified source stated with IV TPA with excellent clinical recovery. Vascular risk factors of HT, CHF and atrial fibrillation. Plan:  Stressed the importance of management of risk factors to prevent further stroke Continue Eliquis for secondary stroke prevention and atrial fibrillation Maintain strict control of hypertension with blood pressure goal below 130/90, today's reading 101/68 Control of diabetes with hemoglobin A1c below 6.5  followed by primary care Cholesterol with LDL cholesterol less than 70, followed by primary care,   Exercise by walking, yoga use cane or walker at all times  Will discharge from stroke clinic  I spent 25 minutes in total face to face time with the patient more than 50% of which was spent counseling and coordination of care, reviewing test results reviewing medications and discussing and reviewing the diagnosis of stroke and management of risk factors. Given written information Dennie Bible, [facility], [facility], APRN  [facility] [address], [place], [place] [address] [phone]

## 2017-05-03 ENCOUNTER — Encounter: Payer: Self-pay | Admitting: Nurse Practitioner

## 2017-05-03 ENCOUNTER — Ambulatory Visit (INDEPENDENT_AMBULATORY_CARE_PROVIDER_SITE_OTHER): Payer: Medicare Other | Admitting: Nurse Practitioner

## 2017-05-03 VITALS — BP 101/68 | HR 89 | Wt 157.0 lb

## 2017-05-03 DIAGNOSIS — Z8673 Personal history of transient ischemic attack (TIA), and cerebral infarction without residual deficits: Secondary | ICD-10-CM

## 2017-05-03 DIAGNOSIS — R269 Unspecified abnormalities of gait and mobility: Secondary | ICD-10-CM

## 2017-05-03 DIAGNOSIS — I480 Paroxysmal atrial fibrillation: Secondary | ICD-10-CM

## 2017-05-03 DIAGNOSIS — I5022 Chronic systolic (congestive) heart failure: Secondary | ICD-10-CM | POA: Diagnosis not present

## 2017-05-03 NOTE — Patient Instructions (Addendum)
Stressed the importance of management of risk factors to prevent further stroke Continue Eliquis for secondary stroke prevention and atrial fibrillation Maintain strict control of hypertension with blood pressure goal below 130/90, today's reading 101/68 Control of diabetes with hemoglobin A1c below 6.5 followed by primary care Cholesterol with LDL cholesterol less than 70, followed by primary care,   Exercise by walking, yoga use cane or walker at all times  Will discharge from stroke clinic Stroke Prevention Some health problems and behaviors may make it more likely for you to have a stroke. Below are ways to lessen your risk of having a stroke.  Be active for at least 30 minutes on most or all days.  Do not smoke. Try not to be around others who smoke.  Do not drink too much alcohol. ? Do not have more than 2 drinks a day if you are a man. ? Do not have more than 1 drink a day if you are a woman and are not pregnant.  Eat healthy foods, such as fruits and vegetables. If you were put on a specific diet, follow the diet as told.  Keep your cholesterol levels under control through diet and medicines. Look for foods that are low in saturated fat, trans fat, cholesterol, and are high in fiber.  If you have diabetes, follow all diet plans and take your medicine as told.  Ask your doctor if you need treatment to lower your blood pressure. If you have high blood pressure (hypertension), follow all diet plans and take your medicine as told by your doctor.  If you are 28-17 years old, have your blood pressure checked every 3-5 years. If you are age 103 or older, have your blood pressure checked every year.  Keep a healthy weight. Eat foods that are low in calories, salt, saturated fat, trans fat, and cholesterol.  Do not take drugs.  Avoid birth control pills, if this applies. Talk to your doctor about the risks of taking birth control pills.  Talk to your doctor if you have sleep problems  (sleep apnea).  Take all medicine as told by your doctor. ? You may be told to take aspirin or blood thinner medicine. Take this medicine as told by your doctor. ? Understand your medicine instructions.  Make sure any other conditions you have are being taken care of.  Get help right away if:  You suddenly lose feeling (you feel numb) or have weakness in your face, arm, or leg.  Your face or eyelid hangs down to one side.  You suddenly feel confused.  You have trouble talking (aphasia) or understanding what people are saying.  You suddenly have trouble seeing in one or both eyes.  You suddenly have trouble walking.  You are dizzy.  You lose your balance or your movements are clumsy (uncoordinated).  You suddenly have a very bad headache and you do not know the cause.  You have new chest pain.  Your heart feels like it is fluttering or skipping a beat (irregular heartbeat). Do not wait to see if the symptoms above go away. Get help right away. Call your local emergency services (911 in [place] Do not drive yourself to the hospital. This information is not intended to replace advice given to you by your health care provider. Make sure you discuss any questions you have with your health care provider. Document Released: 02/15/2012 Document Revised: 01/22/2016 Document Reviewed: 02/16/2013 Elsevier Interactive Patient Education  [employer].

## 2017-05-03 NOTE — Progress Notes (Signed)
I reviewed above note and agree with the assessment and plan.  Rosalin Hawking, MD PhD Stroke Neurology 05/03/2017 5:30 PM

## 2017-05-03 NOTE — Congregational Nurse Program (Signed)
Congregational Nurse Program Note  Date of Encounter: 05/03/2017  Past Medical History: No past medical history on file.  Encounter Details:     CNP Questionnaire - 05/03/17 1529      Patient Demographics   Is this a new or existing patient? Existing   Patient is considered a/an Not Applicable   Race African-American/Black     Patient Assistance   Location of Patient Assistance Not Applicable   Patient's financial/insurance status Medicare   Uninsured Patient ([place]/Care Connects) No   Patient referred to apply for the following financial assistance Not Applicable   Food insecurities addressed Not Applicable   Transportation assistance No   Assistance securing medications No   Educational health offerings Exercise/physical activity     Encounter Details   Primary purpose of visit Spiritual Care/Support Visit   Was an Emergency Department visit averted? Not Applicable   Does patient have a medical provider? Yes   Patient referred to Not Applicable   Was a mental health screening completed? (GAINS tool) No   Does patient have dental issues? No   Does patient have vision issues? No   Does your patient have an abnormal blood pressure today? No   Since previous encounter, have you referred patient for abnormal blood pressure that resulted in a new diagnosis or medication change? No   Does your patient have an abnormal blood glucose today? No   Since previous encounter, have you referred patient for abnormal blood glucose that resulted in a new diagnosis or medication change? No   Was there a life-saving intervention made? No     Attended chair yoga class.

## 2017-05-20 ENCOUNTER — Encounter: Payer: Self-pay | Admitting: Nurse Practitioner

## 2017-06-06 ENCOUNTER — Encounter: Payer: Self-pay | Admitting: Neurology

## 2017-06-06 ENCOUNTER — Ambulatory Visit (INDEPENDENT_AMBULATORY_CARE_PROVIDER_SITE_OTHER): Payer: Medicare Other | Admitting: Neurology

## 2017-06-06 VITALS — BP 119/76 | HR 67 | Wt 158.0 lb

## 2017-06-06 DIAGNOSIS — H539 Unspecified visual disturbance: Secondary | ICD-10-CM | POA: Diagnosis not present

## 2017-06-06 DIAGNOSIS — G3184 Mild cognitive impairment, so stated: Secondary | ICD-10-CM | POA: Diagnosis not present

## 2017-06-06 MED ORDER — FISH OIL 1200 MG PO CAPS: 1.0000 | ORAL_CAPSULE | ORAL | 0 refills | Status: AC

## 2017-06-06 NOTE — Patient Instructions (Signed)
I had a long d/w patient about her recent episode of transient vision disturbance, remote stroke, , risk for recurrent stroke/TIAs, personally independently reviewed imaging studies and stroke evaluation results and answered questions.Continue Eliquis (apixaban) daily  for secondary stroke prevention and no need to add aspirin as I do not believe there is is any data to show benefitand maintain strict control of hypertension with blood pressure goal below 130/90, diabetes with hemoglobin A1c goal below 6.5% and lipids with LDL cholesterol goal below 70 mg/dL. I also advised the patient to eat a healthy diet with plenty of whole grains, cereals, fruits and vegetables, exercise regularly and maintain ideal body weight . I advised her to participate in cognitively challenging activities like solving crossword puzzles, playing bridge and sudoku to help with the mild cognitive impairment. Start fish oil 1200 mg capsule daily. He also discussed memory compensation strategies. She was advised to use a cane at all times for fall prevention and safety. No scheduled follow-up appointment is necessary but she may call and make an appointment in the future only as necessary. Memory Compensation Strategies  1. Use "WARM" strategy.  W= write it down  A= associate it  R= repeat it  M= make a mental note  2.   You can keep a Social worker.  Use a 3-ring notebook with sections for the following: calendar, important names and phone numbers,  medications, doctors' names/phone numbers, lists/reminders, and a section to journal what you did  each day.   3.    Use a calendar to write appointments down.  4.    Write yourself a schedule for the day.  This can be placed on the calendar or in a separate section of the Memory Notebook.  Keeping a  regular schedule can help memory.  5.    Use medication organizer with sections for each day or morning/evening pills.  You may need help loading it  6.    Keep a basket, or  pegboard by the door.  Place items that you need to take out with you in the basket or on the pegboard.  You may also want to  include a message board for reminders.  7.    Use sticky notes.  Place sticky notes with reminders in a place where the task is performed.  For example: " turn off the  stove" placed by the stove, "lock the door" placed on the door at eye level, " take your medications" on  the bathroom mirror or by the place where you normally take your medications.  8.    Use alarms/timers.  Use while cooking to remind yourself to check on food or as a reminder to take your medicine, or as a  reminder to make a call, or as a reminder to perform another task, etc.

## 2017-06-06 NOTE — Progress Notes (Signed)
STROKE NEUROLOGY FOLLOW UP NOTE  NAME: Kim Mullins DOB: 06-05-1930  REASON FOR VISIT: stroke follow up HISTORY FROM: pt and chart  Today we had the pleasure of seeing Kim Mullins in follow-up at our Neurology Clinic. Pt was accompanied by no one.   History Summary Kim Mullins is a 83 y.o. female with history of chronic systolic CHF/nonischemic cardiomyopathy, complete AVB s/p pacemaker placement, paroxysmal atrial fibrillation on eliquis 2.5mg  bid, history of CHF last EF 15-20% in 01/2016 and OSA on CPAP was admitted on 04/12/16 for visual field cut. Symptoms resolved in 1-2 hours. She also had transient slurred speech one week PTA in [place]. Had old stroke in 2016 with flashing lights all over the visual field, following with Dr. Leonie Man in clinic. During admission, noticed patient BP at 90s on multiple BP meds, had cardiology consult and hold off all BP meds. Also found that her eliquis was under dosed, and then increased to 5mg  bid. Her symptoms could be due to hypoperfusion vs. eliquis underdosing.  CTA head and neck, CUS, TTE unremarkable. EF 40-45%. LDL 46 and A1C 5.7. She was discharged in good condition.  During the interval time, the patient has been doing well.  No recurrent stroke like symptoms. On eliquis 5mg  bid without bleeding side effects. However, her BP today 94/64, still low and she was put back on coreg, lasix, lisinopril and spironolactone by cardiology for fluid overload on 05/21/16. She missed cardiology follow up on 06/21/16. Currently asymptomatic. [place] have [place].   Update 10/27/2016 ; she returns for follow-up after last visit 4 months ago by Dr. Erlinda Hong. She continues to do well without recurrent stroke or TIA symptoms. She remains on eliquis which is tolerating well with only minor bruising and no bleeding episodes. She continues to see a cardiologist Dr. Aundra Dubin for congestive heart failure which appears to be adequately treated.  She denies any shortness of breath. She has recently finished cardiac rehabilitation after 38 weeks. She denies any new health problems. She does use a cane consistently and has not had any falls or injuries. She in fact the ventricles and ankle [place] for Christmas and had a great time. States her blood pressure is well controlled and today it is 130/70. She has no new neurological complaints today Update 06/06/2017 ; she returns for follow-up after last visit with Gilford Raid and March 2018. She had a transient episode of vision disturbance on September 30 while visiting her granddaughter in [place]. She stated that she is driving when she noticed flickering can like feeling in both eyes. She did not lose vision and you could drive to the hospital. She was transferred from initially a smaller Hospital and then to [facility] in [place] where she underwent CT scan and CT angiogram of the brain and neck which did not show any significant large vessel stenosis or occlusion. I have personally reviewed her records through care everywhere. Echocardiogram showed diminished ejection fraction of 25-30% but no definite clot. Lipid profile was unremarkable and basic chemistries and CBC within normal. Patient was on eliquis for atrial fibrillation and she was advised to add aspirin but she hasn't done that yet. She continues to live alone and is independent in activities of daily living. She uses a cane to walk outdoors. She's had no falls or injuries. She does have some mild short-term memory difficulties but she feels these unchanged. She admits to having misplaced her keys recently. She  denies any hallucinations, delusions or any unsafe behaviors. She states her blood pressure is well controlled and today it is 119/76.  REVIEW OF SYSTEMS: Full 14 system review of systems performed and notable only for those listed below and in HPI above,    Fatigue, hearing loss, ringing in the ears, eye itching,  shortness of breath, apnea, skin itching  Neurologic Examination    Blood pressure 119/76, pulse 67, weight 158 lb (71.7 kg).  General - pleasant elderly African-American lady, in no apparent distress.  Ophthalmologic - funduscopic exam not done  Cardiovascular - Regular rate and rhythm with no murmur.  Mental Status -  Level of arousal and orientation to time, place, and person were intact. Language including expression, naming, repetition, comprehension was assessed and found intact. Diminished recall 2/3. Animal naming 11. Clock drawing 4/4. Fund of Knowledge was assessed and was intact.  Cranial Nerves II - XII - II - Visual field intact OU. III, IV, VI - Extraocular movements intact. V - Facial sensation intact bilaterally. VII - Facial movement intact bilaterally. VIII - Hearing & vestibular intact bilaterally. X - Palate elevates symmetrically. XI - Chin turning & shoulder shrug intact bilaterally. XII - Tongue protrusion intact.  Motor Strength - The patient's strength was normal in all extremities and pronator drift was absent.  Bulk was normal and fasciculations were absent.   Motor Tone - Muscle tone was assessed at the neck and appendages and was normal.  Reflexes - The patient's reflexes were 1+ in all extremities and she had no pathological reflexes.  Sensory - Light touch, temperature/pinprick, vibration and proprioception, and Romberg testing were assessed and were normal.  Diminished vibration from ankle down bilaterally.  Coordination - The patient had normal movements in the hands and feet with no ataxia or dysmetria.  Tremor was absent.  Gait and Station - The patient's transfers, posture, gait, station, and turns were observed as normal. Mild difficulty with tandem walking and uses a cane for ambulation.   Data reviewed: I personally reviewed the images and agree with the radiology interpretations.  Ct Head Code Stroke Wo Contrast 04/12/2016 1. Stable  and normal for age Normal noncontrast CT appearance of the brain. 2. ASPECTS is 10.   Carotid Doppler   There is 1-39% bilateral ICA stenosis. Vertebral artery flow is antegrade.    2-D echo - Left ventricle: The cavity size was normal. There was moderate concentric hypertrophy. Systolic function was mildly to moderately reduced. The estimated ejection fraction was in the range of 40% to 45%. Diffuse hypokinesis. The study is not technically sufficient to allow evaluation of LV diastolic function. - Mitral valve: There was mild regurgitation. - Left atrium: The atrium was mildly dilated. - Right atrium: The atrium was mildly dilated. Impressions: - No cardiac source of emboli was indentified.  CTA head and neck  IMPRESSION: CTA NECK: No hemodynamically significant stenosis or acute vascular process. RIGHT retropectoral lymphadenopathy partially imaged. Recommend correlation with mammography on a nonemergent basis in dedicated follow-up. Patulous esophagus with air-fluid level presents aspiration risk. CTA HEAD: Negative.   Component     Latest Ref Rng & Units 04/13/2016  Cholesterol     0 - 200 mg/dL 90  Triglycerides     <150 mg/dL 69  HDL Cholesterol     >40 mg/dL 30 (L)  Total CHOL/HDL Ratio     RATIO 3.0  VLDL     0 - 40 mg/dL 14  LDL (calc)  0 - 99 mg/dL 46  Hemoglobin A1C     4.8 - 5.6 % 5.7 (H)  Mean Plasma Glucose     mg/dL 117    Assessment: 86 year with suspected left occipital infarct in October 6256 of embolic etiology without identified source stated with IV TPA with excellent clinical recovery. Vascular risk factors of   HT, CHF and atrial fibrillation. Episode of transient vision disturbance in September 2018 while visiting our concern is unclear as to posterior circulation TIA versus atypical migraine. Negative brain imaging studies. New complaint of memory loss as well which is age appropriate mild cognitive impairment. Plan:  -I had a  long d/w patient about her recent episode of transient vision disturbance, remote stroke, , risk for recurrent stroke/TIAs, personally independently reviewed imaging studies and stroke evaluation results and answered questions.Continue Eliquis (apixaban) daily  for secondary stroke prevention and no need to add aspirin as I do not believe there is is any data to show benefitand maintain strict control of hypertension with blood pressure goal below 130/90, diabetes with hemoglobin A1c goal below 6.5% and lipids with LDL cholesterol goal below 70 mg/dL. I also advised the patient to eat a healthy diet with plenty of whole grains, cereals, fruits and vegetables, exercise regularly and maintain ideal body weight . I advised her to participate in cognitively challenging activities like solving crossword puzzles, playing bridge and sudoku to help with the mild cognitive impairment. Start fish oil 1200 mg capsule daily. He also discussed memory compensation strategies. She was advised to use a cane at all times for fall prevention and safety. No scheduled follow-up appointment is necessary but she may call and make an appointment in the future only as necessary I spent more than 25 minutes of face to face time with the patient. Greater than 50% of time was spent in counseling and coordination of care. about atrial fibrillation and stroke risk.                                Antony Contras, MD [facility] [address], [place], [place] [address] [phone]

## 2017-07-05 NOTE — Congregational Nurse Program (Signed)
Congregational Nurse Program Note  Date of Encounter: 07/05/2017  Past Medical History: Past Medical History:  Diagnosis Date  . Anemia    occassionally  . Arthritis    all over.  . Asthma    Allergixc reaction to cats.  . Atrial fibrillation ([place])   . Bowel obstruction ([place])   . Chronic systolic CHF (congestive heart failure) (HCC)    EF 30-35% by cath, echo 2016 EF 50%  . Complete heart block (HCC)    a. s/p MDT CRTP pacemaker  . DCM (dilated cardiomyopathy) ([place]) 08/16/2014   normal coronary arteries on cath with EF 30-45%.  EF now 50% by echo 11/2014  . Diabetes mellitus 2006   Diet and exercise controlled.  Marland Kitchen GERD (gastroesophageal reflux disease)    occ  . Hypertension   . Left tibial fracture 2007  . OSA (obstructive sleep apnea) 10/23/2014   Moderate with AHI 21/hr  . Osteoarthritis of right shoulder region 06/26/2013  . Stroke [facility])     Encounter Details: CNP Questionnaire - 07/05/17 1435      Questionnaire   Patient Status  Not Applicable    Race  Black or African American    Location Patient Served At  [place]  No food insecurities    Housing/Utilities  Yes, have permanent housing    Transportation  No transportation needs    Interpersonal Safety  Yes, feel physically and emotionally safe where you currently live    Medication  No medication insecurities    Medical Provider  Yes    Referrals  Not Applicable    ED Visit Averted  Not Applicable    Life-Saving Intervention Made  Not Applicable     Madelaine Etienne, [place], [phone].

## 2017-07-29 NOTE — Congregational Nurse Program (Signed)
Congregational Nurse Program Note  Date of Encounter: 07/29/2017  Past Medical History: Past Medical History:  Diagnosis Date  . Anemia    occassionally  . Arthritis    all over.  . Asthma    Allergixc reaction to cats.  . Atrial fibrillation ([place])   . Bowel obstruction ([place])   . Chronic systolic CHF (congestive heart failure) (HCC)    EF 30-35% by cath, echo 2016 EF 50%  . Complete heart block (HCC)    a. s/p MDT CRTP pacemaker  . DCM (dilated cardiomyopathy) ([place]) 08/16/2014   normal coronary arteries on cath with EF 30-45%.  EF now 50% by echo 11/2014  . Diabetes mellitus 2006   Diet and exercise controlled.  Marland Kitchen GERD (gastroesophageal reflux disease)    occ  . Hypertension   . Left tibial fracture 2007  . OSA (obstructive sleep apnea) 10/23/2014   Moderate with AHI 21/hr  . Osteoarthritis of right shoulder region 06/26/2013  . Stroke [facility])     Encounter Details: CNP Questionnaire - 07/29/17 2238      Questionnaire   Patient Status  Not Applicable    Race  Black or African American    Location Patient Served At  Not Applicable    Insurance  Medicare    Uninsured  Not Applicable    Food  No food insecurities    Housing/Utilities  Yes, have permanent housing    Transportation  No transportation needs    Interpersonal Safety  Yes, feel physically and emotionally safe where you currently live    Medication  No medication insecurities    Medical Provider  Yes    Referrals  Not Applicable    ED Visit Averted  Not Applicable    Life-Saving Intervention Made  Not Applicable     CNP, Madelaine Etienne, [phone].

## 2017-08-15 ENCOUNTER — Other Ambulatory Visit (HOSPITAL_COMMUNITY): Payer: Self-pay | Admitting: *Deleted

## 2017-08-15 DIAGNOSIS — I5022 Chronic systolic (congestive) heart failure: Secondary | ICD-10-CM

## 2017-08-15 MED ORDER — FUROSEMIDE 40 MG PO TABS: 40.0000 mg | ORAL_TABLET | Freq: Two times a day (BID) | ORAL | 3 refills | Status: DC

## 2017-09-29 NOTE — Congregational Nurse Program (Signed)
Congregational Nurse Program Note  Date of Encounter: 09/29/2017  Past Medical History: Past Medical History:  Diagnosis Date  . Anemia    occassionally  . Arthritis    all over.  . Asthma    Allergixc reaction to cats.  . Atrial fibrillation ([place])   . Bowel obstruction ([place])   . Chronic systolic CHF (congestive heart failure) (HCC)    EF 30-35% by cath, echo 2016 EF 50%  . Complete heart block (HCC)    a. s/p MDT CRTP pacemaker  . DCM (dilated cardiomyopathy) ([place]) 08/16/2014   normal coronary arteries on cath with EF 30-45%.  EF now 50% by echo 11/2014  . Diabetes mellitus 2006   Diet and exercise controlled.  Marland Kitchen GERD (gastroesophageal reflux disease)    occ  . Hypertension   . Left tibial fracture 2007  . OSA (obstructive sleep apnea) 10/23/2014   Moderate with AHI 21/hr  . Osteoarthritis of right shoulder region 06/26/2013  . Stroke [facility])     Encounter Details: CNP Questionnaire - 09/29/17 1648      Questionnaire   Patient Status  Not Applicable    Race  Black or African American    Location Patient Served At  Not Applicable    Insurance  Medicare    Uninsured  Not Applicable    Food  No food insecurities    Housing/Utilities  Yes, have permanent housing    Transportation  No transportation needs    Interpersonal Safety  Yes, feel physically and emotionally safe where you currently live    Medication  No medication insecurities    Medical Provider  Yes    Referrals  Not Applicable    ED Visit Averted  Not Applicable    Life-Saving Intervention Made  Not Applicable

## 2017-10-07 ENCOUNTER — Encounter: Payer: Self-pay | Admitting: Nurse Practitioner

## 2017-11-24 ENCOUNTER — Telehealth (HOSPITAL_COMMUNITY): Payer: Self-pay | Admitting: Cardiology

## 2017-11-24 NOTE — Telephone Encounter (Signed)
Patient called to report increase in SOB, fatigue. Denies CP Mild LE edema Weight stable at home 160  Patient requested an appt  Advised I would not be able to add her to see Dr McLean/ECHO as previously ordered, would he to get her an appt with PA/NP to address SOB and then routine follow up with ECHO can be done at a later time  -continue current medication as ordered  Patient agreeable to plan  add-on/acute visit 4/1 @ 9 Echo/[place] 5/30 9 and 10 am

## 2017-11-28 ENCOUNTER — Ambulatory Visit (HOSPITAL_COMMUNITY)
Admission: RE | Admit: 2017-11-28 | Discharge: 2017-11-28 | Disposition: A | Discharge status: Home / Self Care | Payer: Medicare Other | Source: Ambulatory Visit | Attending: Internal Medicine | Admitting: Internal Medicine

## 2017-11-28 ENCOUNTER — Encounter (HOSPITAL_COMMUNITY): Payer: Self-pay

## 2017-11-28 VITALS — BP 110/80 | HR 82 | Wt 160.4 lb

## 2017-11-28 DIAGNOSIS — I480 Paroxysmal atrial fibrillation: Secondary | ICD-10-CM | POA: Insufficient documentation

## 2017-11-28 DIAGNOSIS — I5022 Chronic systolic (congestive) heart failure: Secondary | ICD-10-CM | POA: Diagnosis not present

## 2017-11-28 DIAGNOSIS — K219 Gastro-esophageal reflux disease without esophagitis: Secondary | ICD-10-CM | POA: Insufficient documentation

## 2017-11-28 DIAGNOSIS — J45909 Unspecified asthma, uncomplicated: Secondary | ICD-10-CM | POA: Diagnosis not present

## 2017-11-28 DIAGNOSIS — D890 Polyclonal hypergammaglobulinemia: Secondary | ICD-10-CM | POA: Diagnosis not present

## 2017-11-28 DIAGNOSIS — G4733 Obstructive sleep apnea (adult) (pediatric): Secondary | ICD-10-CM | POA: Diagnosis not present

## 2017-11-28 DIAGNOSIS — Z7901 Long term (current) use of anticoagulants: Secondary | ICD-10-CM | POA: Insufficient documentation

## 2017-11-28 DIAGNOSIS — Z87891 Personal history of nicotine dependence: Secondary | ICD-10-CM | POA: Diagnosis not present

## 2017-11-28 DIAGNOSIS — Z79899 Other long term (current) drug therapy: Secondary | ICD-10-CM | POA: Diagnosis not present

## 2017-11-28 DIAGNOSIS — Z8673 Personal history of transient ischemic attack (TIA), and cerebral infarction without residual deficits: Secondary | ICD-10-CM | POA: Insufficient documentation

## 2017-11-28 DIAGNOSIS — E119 Type 2 diabetes mellitus without complications: Secondary | ICD-10-CM | POA: Insufficient documentation

## 2017-11-28 DIAGNOSIS — I429 Cardiomyopathy, unspecified: Secondary | ICD-10-CM | POA: Diagnosis not present

## 2017-11-28 DIAGNOSIS — I442 Atrioventricular block, complete: Secondary | ICD-10-CM | POA: Diagnosis not present

## 2017-11-28 LAB — BASIC METABOLIC PANEL
Anion gap: 10 (ref 5–15)
BUN: 18 mg/dL (ref 6–20)
CALCIUM: 8.8 mg/dL — AB (ref 8.9–10.3)
CO2: 24 mmol/L (ref 22–32)
CREATININE: 1.17 mg/dL — AB (ref 0.44–1.00)
Chloride: 104 mmol/L (ref 101–111)
GFR calc non Af Amer: 41 mL/min — ABNORMAL LOW (ref >60)
GFR, EST AFRICAN AMERICAN: 48 mL/min — AB (ref >60)
Glucose, Bld: 121 mg/dL — ABNORMAL HIGH (ref 65–99)
Potassium: 3.8 mmol/L (ref 3.5–5.1)
SODIUM: 138 mmol/L (ref 135–145)

## 2017-11-28 MED ORDER — FUROSEMIDE 40 MG PO TABS: ORAL_TABLET | ORAL | 3 refills | Status: DC

## 2017-11-28 NOTE — Progress Notes (Signed)
Patient ID: Kim Mullins, female   DOB: 09/12/1929, 84 y.o.   MRN: [id number] PCP: Dr. Ayesha Rumpf Cardiology: Dr. Radford Pax HF Cardiology: Dr. Salem Senate is a 84 y.o. female with history of chronic systolic CHF/nonischemic cardiomyopathy, prior complete heart block, OSA, and paroxysmal atrial fibrillation presents for CHF clinic evaluation.  She was diagnosed with a cardiomyopathy back in 2015.  She had right and left heart cath in 12/15, showing no significant coronary disease.  EF was in the 30-35% range.  She was admitted in 2/16 with symptomatic bradycardia/complete heart block and had Medtronic CRT-P placed.  In 4/16, EF was back up to 50-55%.  However, repeat echo in 6/17 showed EF down to 15-20% with RV dysfunction.    She was admitted in 8/17 with suspected TIA => dysarthria and blurred vision.  She had been on Eliquis 2.5 mg bid, this was increased to 5 mg bid.  Lisinopril was stopped and Coreg was decreased due to soft BP.  Echo was done that admission, showing improvement in EF to 40-45%.    She presents today for yearly follow up. Last seen 11/2016. She has been more SOB over the past month. Weight at home has trended up about 7 -10 lbs over that time period. Up to 160 from baseline around 150. She has noticed decreased UOP on lasix 40 mg BID. She denies lightheadedness or dizziness. She uses her CPAP intermittently 4-6 days a week. Taking all medications as directed. Limits fluid to < 2 L daily.   Optivol: Interrogated personally in clinic. Fluid status elevated since January. Short run of AF/AT last week. Occasional AT. No VT/VF. Patient activity ~ 1 hr trending up towards 2 hrs.   Labs (3/17): HCT 36.9 Labs (5/17): K 4.2, creatinine 0.98 Labs (7/17): K 4.7, creatinine 1.11, BNP 304 Labs (8/17): K 4, creatinine 0.96, hgb 10.3, LDL 46, M-spike on SPEP.  Labs (9/17): Urine IFE negative, K 4.2, creatinine 1.2, hgb 12 Labs (10/17): K 3.8, creatinine  0.99  PMH: 1. Type II diabetes: Diet-controlled.  2. GERD: s/p esophageal dilatation.  3. Chronic systolic CHF: 1st noted in 2015. Nonischemic cardiomyopathy.  - Echo (11/15) with EF 40-45%.  - LHC/RHC (12/15): Normal coronaries, EF 30-35%, mean RA 9, PA 44/16 mean 31, unable to obtain PCWP, CI 2.5.  - Developed CHB and had Medtronic CRT-P placed in 2/16. - Echo (4/16) with EF 50-55%.  - Echo (6/17): EF 15-20%, moderate LVh, restrictive diastolic function, mild MR, RV moderately dilated and moderately decreased in function, moderate TR, PASP 46 mmHg.  - Echo (8/17): EF 40-45%, diffuse hypokinesis, mild MR.  4. Complete heart block: Medtronic CRT-P in 2/16.  5. OSA: Uses CPAP 6. Asthma 7. Atrial fibrillation: Paroxysmal.  8. H/o CVA.  Possible TIA in 8/17.    - Carotid dopplers (8/17) with 1-39% BICA stenosis.   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   FH: No cardiac problems that she knows of.   Review of systems complete and found to be negative unless listed in HPI.    Current Outpatient Medications  Medication Sig Dispense Refill  . Acetaminophen (TYLENOL) 325 MG CAPS Take 325 mg by mouth daily as needed (for shoulder pain).     Marland Kitchen apixaban (ELIQUIS) 5 MG TABS tablet Take 1 tablet (5 mg total) by mouth 2 (two) times daily. 60 tablet 6  . Ascorbic Acid (  VITAMIN C PO) Take 1 tablet by mouth daily. Reported on 12/19/2015    . Bisacodyl (LAXATIVE PO) Take 1 tablet by mouth daily as needed (for constipation).     . CALCIUM PO Take 1 tablet by mouth daily.    . carvedilol (COREG) 6.25 MG tablet TAKE 1 TABLET BY MOUTH TWICE DAILY WITH A MEAL 60 tablet 3  . diclofenac sodium (VOLTAREN) 1 % GEL Apply 2 g topically 3 (three) times daily as needed (pain, inflammation). 100 g 0  . diphenhydramine-acetaminophen (TYLENOL PM) 25-500 MG TABS tablet Take 1 tablet by mouth at bedtime as needed (sleep).     . furosemide  (LASIX) 40 MG tablet Take 1 tablet (40 mg total) by mouth 2 (two) times daily. 180 tablet 3  . glucose blood test strip Use to test blood sugar twice daily (Accuchek Smartview) 100 each 0  . latanoprost (XALATAN) 0.005 % ophthalmic solution PLACE 1 GTT IN BOTH EYES HS  11  . lisinopril (PRINIVIL,ZESTRIL) 2.5 MG tablet Take 1 tablet (2.5 mg total) by mouth every evening. NEEDS OFFICE VISIT 30 tablet 3  . spironolactone (ALDACTONE) 25 MG tablet Take 0.5 tablets (12.5 mg total) by mouth at bedtime. 15 tablet 3  . timolol (TIMOPTIC) 0.5 % ophthalmic solution Place 1 drop into both eyes daily.   11   No current facility-administered medications for this encounter.    Vitals:   11/28/17 0901  BP: 110/80  Pulse: 82  SpO2: 99%  Weight: 160 lb 6.4 oz (72.8 kg)   Wt Readings from Last 3 Encounters:  11/28/17 160 lb 6.4 oz (72.8 kg)  06/06/17 158 lb (71.7 kg)  05/03/17 157 lb (71.2 kg)    Physical Exam General: Well appearing. No resp difficulty. HEENT: Normal Neck: Supple. JVP to jaw. Carotids 2+ bilat; no bruits. No thyromegaly or nodule noted. Cor: PMI nondisplaced. RRR, No M/G/R noted Lungs: CTAB, normal effort. Abdomen: Soft, non-tender, non-distended, no HSM. No bruits or masses. +BS  Extremities: No cyanosis, clubbing, or rash. 1-2+ LLE edema.  Neuro: Alert & orientedx3, cranial nerves grossly intact. moves all 4 extremities w/o difficulty. Affect pleasant   Assessment/Plan: 1. Chronic systolic CHF: Nonischemic cardiomyopathy, no coronary disease on cath in 12/15. Etiology uncertain => ?viral myocarditis.  She does not remember a family history of cardiomyopathy and has never drank ETOH heavily.  LV function initially improved in 2016, but EF back down to 15-20% with RV dysfunction on 6/17 echo.   - Echo 03/2016 with EF improved to 40-45%.  - NYHA III-IIIb currently.  - Volume status elevated on exam - Increase lasix to 80 mg BID x 3 days, then to 80 mg q am and 40 mg q pm moving  forward. - Continue Coreg 6.25mg  BID.  - Continue spironolactone 12.5 daily - Continue lisinopril 2.5 mg  If EF down on upcoming Echo, may be good Entresto candidate.  - Abnormal SPEP => urine immunofixation normal, polyclonal gammopathy, not suggestive of AL amyloidosis. May need to consider PYP scan. Will discuss with MD.  - Has CRT device, cannot get MRI.  - Plan repeat in 2 months with MD visit. (Already planned and ordered) 2. Complete heart block: Medtronic CRT-P.  - Follows with Dr. Lovena Le.  3. Atrial fibrillation: Paroxysmal  - Had suspected TIA in 8/17, suspect that Eliquis was underdosed.  - Continue full dose Eliquis 5mg  BID. Denies bleeding.  4. OSA:  - Encouraged nightly CPAP.   Keep follow up with Dr. Aundra Dubin with  Echo. BMET today. Follow up 2 weeks to check on volume status.   Shirley Friar, PA-C  11/28/2017  Greater than 50% of the 25 minute visit was spent in counseling/coordination of care regarding disease state education, salt/fluid restriction, sliding scale diuretics, and medication compliance.

## 2017-11-28 NOTE — Patient Instructions (Addendum)
INCREASE lasix as follows... 80 mg (2 tabs) twice daily for three days. Then 80 mg (2 tabs) in am and 40 mg (1 tab) in pm until follow up appointment with our office.  Routine lab work today. Will notify you of abnormal results, otherwise no news is good news!  Follow up 2 weeks with Oda Kilts PA-C.  ________________________________________________________________ Marin Roberts Code: 1100  Take all medication as prescribed the day of your appointment. Bring all medications with you to your appointment.  Do the following things EVERYDAY: 1) Weigh yourself in the morning before breakfast. Write it down and keep it in a log. 2) Take your medicines as prescribed 3) Eat low salt foods-Limit salt (sodium) to 2000 mg per day.  4) Stay as active as you can everyday 5) Limit all fluids for the day to less than 2 liters

## 2017-12-11 NOTE — Progress Notes (Signed)
h   Advanced Heart Failure Clinic Note   Patient ID: Kim Mullins, female   DOB: Jun 10, 1930, 84 y.o.   MRN: [id number] PCP: Dr. Ayesha Rumpf Cardiology: Dr. Radford Pax HF Cardiology: Dr. Salem Senate is a 84 y.o. female with history of chronic systolic CHF/nonischemic cardiomyopathy, prior complete heart block, OSA, and paroxysmal atrial fibrillation presents for CHF clinic evaluation.  She was diagnosed with a cardiomyopathy back in 2015.  She had right and left heart cath in 12/15, showing no significant coronary disease.  EF was in the 30-35% range.  She was admitted in 2/16 with symptomatic bradycardia/complete heart block and had Medtronic CRT-P placed.  In 4/16, EF was back up to 50-55%.  However, repeat echo in 6/17 showed EF down to 15-20% with RV dysfunction.    She was admitted in 8/17 with suspected TIA => dysarthria and blurred vision.  She had been on Eliquis 2.5 mg bid, this was increased to 5 mg bid.  Lisinopril was stopped and Coreg was decreased due to soft BP.  Echo was done that admission, showing improvement in EF to 40-45%.    She presents today for regular follow up. Last visit lasix increased.  Weight down 5 lbs.  She is feeling somewhat better, but not quite back to baseline.  She has been trying to use her CPAP nightly, but forgets it at times. Uses 4-6 times a week. She is taking all medications as directed. She is working harder to limit her fluid and salt.   Optivol: Personally interrogated and reviewed. No VT/VF. Pt activity ~1-2 hrs daily. Thoracic impedence trending up towards normal but remains depressed. Optivol/Fluid index has been maxed out for months.   Labs (3/17): HCT 36.9 Labs (5/17): K 4.2, creatinine 0.98 Labs (7/17): K 4.7, creatinine 1.11, BNP 304 Labs (8/17): K 4, creatinine 0.96, hgb 10.3, LDL 46, M-spike on SPEP.  Labs (9/17): Urine IFE negative, K 4.2, creatinine 1.2, hgb 12 Labs (10/17): K 3.8, creatinine 0.99  PMH: 1. Type II  diabetes: Diet-controlled.  2. GERD: s/p esophageal dilatation.  3. Chronic systolic CHF: 1st noted in 2015. Nonischemic cardiomyopathy.  - Echo (11/15) with EF 40-45%.  - LHC/RHC (12/15): Normal coronaries, EF 30-35%, mean RA 9, PA 44/16 mean 31, unable to obtain PCWP, CI 2.5.  - Developed CHB and had Medtronic CRT-P placed in 2/16. - Echo (4/16) with EF 50-55%.  - Echo (6/17): EF 15-20%, moderate LVh, restrictive diastolic function, mild MR, RV moderately dilated and moderately decreased in function, moderate TR, PASP 46 mmHg.  - Echo (8/17): EF 40-45%, diffuse hypokinesis, mild MR.  4. Complete heart block: Medtronic CRT-P in 2/16.  5. OSA: Uses CPAP 6. Asthma 7. Atrial fibrillation: Paroxysmal.  8. H/o CVA.  Possible TIA in 8/17.    - Carotid dopplers (8/17) with 1-39% BICA stenosis.   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   FH: No cardiac problems that she knows of.   Review of systems complete and found to be negative unless listed in HPI.    Current Outpatient Medications  Medication Sig Dispense Refill  . Acetaminophen (TYLENOL) 325 MG CAPS Take 325 mg by mouth daily as needed (for shoulder pain).     Marland Kitchen apixaban (ELIQUIS) 5 MG TABS tablet Take 1 tablet (5 mg total) by mouth 2 (two) times daily. 60 tablet 6  . Ascorbic Acid (VITAMIN C PO) Take 1 tablet  by mouth daily. Reported on 12/19/2015    . Bisacodyl (LAXATIVE PO) Take 1 tablet by mouth daily as needed (for constipation).     . CALCIUM PO Take 1 tablet by mouth daily.    . carvedilol (COREG) 6.25 MG tablet TAKE 1 TABLET BY MOUTH TWICE DAILY WITH A MEAL 60 tablet 3  . diclofenac sodium (VOLTAREN) 1 % GEL Apply 2 g topically 3 (three) times daily as needed (pain, inflammation). 100 g 0  . diphenhydramine-acetaminophen (TYLENOL PM) 25-500 MG TABS tablet Take 1 tablet by mouth at bedtime as needed (sleep).     . furosemide (LASIX) 40 MG tablet Take 80 mg  (2 tabs) in am and 40 mg (1 tab) in pm 270 tablet 3  . glucose blood test strip Use to test blood sugar twice daily (Accuchek Smartview) 100 each 0  . latanoprost (XALATAN) 0.005 % ophthalmic solution PLACE 1 GTT IN BOTH EYES HS  11  . lisinopril (PRINIVIL,ZESTRIL) 2.5 MG tablet Take 1 tablet (2.5 mg total) by mouth every evening. NEEDS OFFICE VISIT 30 tablet 3  . spironolactone (ALDACTONE) 25 MG tablet Take 0.5 tablets (12.5 mg total) by mouth at bedtime. 15 tablet 3  . timolol (TIMOPTIC) 0.5 % ophthalmic solution Place 1 drop into both eyes daily.   11   No current facility-administered medications for this visit.    Vitals:   12/12/17 1328  BP: 102/66  Pulse: 67  SpO2: (!) 88%  Weight: 155 lb 6.4 oz (70.5 kg)     Wt Readings from Last 3 Encounters:  12/12/17 155 lb 6.4 oz (70.5 kg)  11/28/17 160 lb 6.4 oz (72.8 kg)  06/06/17 158 lb (71.7 kg)    Physical Exam General: Well appearing. No resp difficulty. HEENT: Normal Neck: Supple. JVP ~9-10 cm. Carotids 2+ bilat; no bruits. No thyromegaly or nodule noted. Cor: PMI nondisplaced. RRR, No M/G/R noted Lungs: CTAB, normal effort. Abdomen: Soft, non-tender, non-distended, no HSM. No bruits or masses. +BS  Extremities: No cyanosis, clubbing, or rash. Trace edema.  Neuro: Alert & orientedx3, cranial nerves grossly intact. moves all 4 extremities w/o difficulty. Affect pleasant   Assessment/Plan: 1. Chronic systolic CHF: Nonischemic cardiomyopathy, no coronary disease on cath in 12/15. Etiology uncertain => ?viral myocarditis.  She does not remember a family history of cardiomyopathy and has never drank ETOH heavily.  LV function initially improved in 2016, but EF back down to 15-20% with RV dysfunction on 6/17 echo.   - Echo 03/2016 with EF improved to 40-45%.  - NYHA III-IIIb symptoms. Multifactorial - Volume status remains at least mildly elevated. - Continue lasix 80 mg q am and 40 mg q pm.  Take extra 40 mg for next 2 days. Repeat  as needed. BMET today.  - Continue Coreg 6.25mg  BID.  - Continue spironolactone 12.5 daily - Continue lisinopril 2.5 mg  If EF down on upcoming Echo, may be good Entresto candidate.  - Abnormal SPEP => urine immunofixation normal, polyclonal gammopathy, not suggestive of AL amyloidosis. May need to consider PYP scan. Will discuss with MD.  - Has CRT device, cannot get MRI.  - Repeat Echo 01/26/18 at MD appt.  2. Complete heart block: Medtronic CRT-P - Follows with Dr. Lovena Le.  3. Atrial fibrillation: Paroxysmal  - Had suspected TIA in 8/17, suspect that Eliquis was underdosed.  - Continue full dose Eliquis 5mg  BID. Denies bleeding.  4. OSA:  - Encouraged nightly CPAP use.   Meds as above. Keep 6 week  appt with Dr. Aundra Dubin with Echo.   Shirley Friar, PA-C  12/11/2017   Greater than 50% of the 25 minute visit was spent in counseling/coordination of care regarding disease state education, salt/fluid restriction, sliding scale diuretics, and medication compliance.

## 2017-12-12 ENCOUNTER — Encounter: Payer: Self-pay | Admitting: Cardiology

## 2017-12-12 ENCOUNTER — Encounter (HOSPITAL_COMMUNITY): Payer: Self-pay

## 2017-12-12 ENCOUNTER — Ambulatory Visit (HOSPITAL_COMMUNITY)
Admission: RE | Admit: 2017-12-12 | Discharge: 2017-12-12 | Disposition: A | Discharge status: Home / Self Care | Payer: Medicare Other | Source: Ambulatory Visit | Attending: Internal Medicine | Admitting: Internal Medicine

## 2017-12-12 VITALS — BP 102/66 | HR 67 | Wt 155.4 lb

## 2017-12-12 DIAGNOSIS — Z8673 Personal history of transient ischemic attack (TIA), and cerebral infarction without residual deficits: Secondary | ICD-10-CM | POA: Insufficient documentation

## 2017-12-12 DIAGNOSIS — Z7902 Long term (current) use of antithrombotics/antiplatelets: Secondary | ICD-10-CM | POA: Insufficient documentation

## 2017-12-12 DIAGNOSIS — I429 Cardiomyopathy, unspecified: Secondary | ICD-10-CM | POA: Diagnosis not present

## 2017-12-12 DIAGNOSIS — D890 Polyclonal hypergammaglobulinemia: Secondary | ICD-10-CM | POA: Insufficient documentation

## 2017-12-12 DIAGNOSIS — E119 Type 2 diabetes mellitus without complications: Secondary | ICD-10-CM | POA: Diagnosis not present

## 2017-12-12 DIAGNOSIS — G4733 Obstructive sleep apnea (adult) (pediatric): Secondary | ICD-10-CM | POA: Diagnosis not present

## 2017-12-12 DIAGNOSIS — I480 Paroxysmal atrial fibrillation: Secondary | ICD-10-CM | POA: Diagnosis not present

## 2017-12-12 DIAGNOSIS — Z87891 Personal history of nicotine dependence: Secondary | ICD-10-CM | POA: Diagnosis not present

## 2017-12-12 DIAGNOSIS — Z79899 Other long term (current) drug therapy: Secondary | ICD-10-CM | POA: Diagnosis not present

## 2017-12-12 DIAGNOSIS — I5022 Chronic systolic (congestive) heart failure: Secondary | ICD-10-CM | POA: Diagnosis not present

## 2017-12-12 DIAGNOSIS — Z9889 Other specified postprocedural states: Secondary | ICD-10-CM | POA: Insufficient documentation

## 2017-12-12 DIAGNOSIS — I442 Atrioventricular block, complete: Secondary | ICD-10-CM

## 2017-12-12 LAB — BASIC METABOLIC PANEL
ANION GAP: 9 (ref 5–15)
BUN: 19 mg/dL (ref 6–20)
CALCIUM: 8.8 mg/dL — AB (ref 8.9–10.3)
CHLORIDE: 102 mmol/L (ref 101–111)
CO2: 24 mmol/L (ref 22–32)
Creatinine, Ser: 1.21 mg/dL — ABNORMAL HIGH (ref 0.44–1.00)
GFR calc non Af Amer: 40 mL/min — ABNORMAL LOW (ref >60)
GFR, EST AFRICAN AMERICAN: 46 mL/min — AB (ref >60)
Glucose, Bld: 115 mg/dL — ABNORMAL HIGH (ref 65–99)
Potassium: 3.7 mmol/L (ref 3.5–5.1)
Sodium: 135 mmol/L (ref 135–145)

## 2017-12-12 NOTE — Patient Instructions (Signed)
Routine lab work today. Will notify you of abnormal results, otherwise no news is good news!  Take 80 mg (2 tabs) TWICE DAILY for TWO DAYS. Then reduce back to normal daily dose of 80 mg (2 tabs) in am and 40 mg (1 tab) in pm until follow up.  Follow up 4 weeks with Oda Kilts PA-C.  ______________________________________________________________ Kim Mullins Code:  Take all medication as prescribed the day of your appointment. Bring all medications with you to your appointment.  Do the following things EVERYDAY: 1) Weigh yourself in the morning before breakfast. Write it down and keep it in a log. 2) Take your medicines as prescribed 3) Eat low salt foods-Limit salt (sodium) to 2000 mg per day.  4) Stay as active as you can everyday 5) Limit all fluids for the day to less than 2 liters

## 2017-12-23 ENCOUNTER — Other Ambulatory Visit: Payer: Self-pay | Admitting: Cardiology

## 2018-01-03 ENCOUNTER — Other Ambulatory Visit (HOSPITAL_COMMUNITY): Payer: Self-pay | Admitting: *Deleted

## 2018-01-03 MED ORDER — APIXABAN 5 MG PO TABS: 5.0000 mg | ORAL_TABLET | Freq: Two times a day (BID) | ORAL | 6 refills | Status: DC

## 2018-01-09 ENCOUNTER — Encounter (HOSPITAL_COMMUNITY): Payer: Medicare Other

## 2018-01-24 ENCOUNTER — Other Ambulatory Visit (HOSPITAL_COMMUNITY): Payer: Self-pay

## 2018-01-24 DIAGNOSIS — I5022 Chronic systolic (congestive) heart failure: Secondary | ICD-10-CM

## 2018-01-26 ENCOUNTER — Ambulatory Visit (HOSPITAL_BASED_OUTPATIENT_CLINIC_OR_DEPARTMENT_OTHER)
Admission: RE | Admit: 2018-01-26 | Discharge: 2018-01-26 | Disposition: A | Discharge status: Home / Self Care | Payer: Medicare Other | Source: Ambulatory Visit | Attending: Cardiology | Admitting: Cardiology

## 2018-01-26 ENCOUNTER — Encounter: Payer: Self-pay | Admitting: Cardiology

## 2018-01-26 ENCOUNTER — Ambulatory Visit (HOSPITAL_COMMUNITY)
Admission: RE | Admit: 2018-01-26 | Discharge: 2018-01-26 | Disposition: A | Discharge status: Home / Self Care | Payer: Medicare Other | Source: Ambulatory Visit | Attending: Cardiology | Admitting: Cardiology

## 2018-01-26 VITALS — BP 98/62 | HR 61 | Wt 153.6 lb

## 2018-01-26 DIAGNOSIS — G4733 Obstructive sleep apnea (adult) (pediatric): Secondary | ICD-10-CM | POA: Diagnosis not present

## 2018-01-26 DIAGNOSIS — Z7901 Long term (current) use of anticoagulants: Secondary | ICD-10-CM | POA: Diagnosis not present

## 2018-01-26 DIAGNOSIS — I5022 Chronic systolic (congestive) heart failure: Secondary | ICD-10-CM | POA: Diagnosis not present

## 2018-01-26 DIAGNOSIS — I420 Dilated cardiomyopathy: Secondary | ICD-10-CM | POA: Diagnosis not present

## 2018-01-26 DIAGNOSIS — I480 Paroxysmal atrial fibrillation: Secondary | ICD-10-CM | POA: Insufficient documentation

## 2018-01-26 DIAGNOSIS — Z79899 Other long term (current) drug therapy: Secondary | ICD-10-CM | POA: Insufficient documentation

## 2018-01-26 DIAGNOSIS — I429 Cardiomyopathy, unspecified: Secondary | ICD-10-CM | POA: Insufficient documentation

## 2018-01-26 DIAGNOSIS — I110 Hypertensive heart disease with heart failure: Secondary | ICD-10-CM | POA: Diagnosis not present

## 2018-01-26 DIAGNOSIS — Z87891 Personal history of nicotine dependence: Secondary | ICD-10-CM | POA: Insufficient documentation

## 2018-01-26 DIAGNOSIS — Z8673 Personal history of transient ischemic attack (TIA), and cerebral infarction without residual deficits: Secondary | ICD-10-CM | POA: Insufficient documentation

## 2018-01-26 DIAGNOSIS — E119 Type 2 diabetes mellitus without complications: Secondary | ICD-10-CM | POA: Insufficient documentation

## 2018-01-26 DIAGNOSIS — I442 Atrioventricular block, complete: Secondary | ICD-10-CM | POA: Insufficient documentation

## 2018-01-26 LAB — BASIC METABOLIC PANEL
ANION GAP: 9 (ref 5–15)
BUN: 17 mg/dL (ref 6–20)
CHLORIDE: 103 mmol/L (ref 101–111)
CO2: 26 mmol/L (ref 22–32)
Calcium: 9.1 mg/dL (ref 8.9–10.3)
Creatinine, Ser: 1.2 mg/dL — ABNORMAL HIGH (ref 0.44–1.00)
GFR calc Af Amer: 46 mL/min — ABNORMAL LOW (ref >60)
GFR calc non Af Amer: 40 mL/min — ABNORMAL LOW (ref >60)
Glucose, Bld: 119 mg/dL — ABNORMAL HIGH (ref 65–99)
POTASSIUM: 3.4 mmol/L — AB (ref 3.5–5.1)
Sodium: 138 mmol/L (ref 135–145)

## 2018-01-26 MED ORDER — TORSEMIDE 20 MG PO TABS: 80.0000 mg | ORAL_TABLET | Freq: Every day | ORAL | 2 refills | Status: DC

## 2018-01-26 MED ORDER — SPIRONOLACTONE 25 MG PO TABS: 25.0000 mg | ORAL_TABLET | Freq: Every day | ORAL | 3 refills | Status: DC

## 2018-01-26 NOTE — Progress Notes (Signed)
Advanced Heart Failure Clinic Note   Patient ID: LEDORA DELKER, female   DOB: September 24, 1929, 84 y.o.   MRN: [id number] PCP: Dr. Ayesha Rumpf Cardiology: Dr. Radford Pax HF Cardiology: Dr. Salem Senate is a 84 y.o. female with history of chronic systolic CHF/nonischemic cardiomyopathy, prior complete heart block, OSA, and paroxysmal atrial fibrillation presents for CHF clinic evaluation.  She was diagnosed with a cardiomyopathy back in 2015.  She had right and left heart cath in 12/15, showing no significant coronary disease.  EF was in the 30-35% range.  She was admitted in 2/16 with symptomatic bradycardia/complete heart block and had Medtronic CRT-P placed.  In 4/16, EF was back up to 50-55%.  However, repeat echo in 6/17 showed EF down to 15-20% with RV dysfunction.    She was admitted in 8/17 with suspected TIA => dysarthria and blurred vision.  She had been on Eliquis 2.5 mg bid, this was increased to 5 mg bid.  Lisinopril was stopped and Coreg was decreased due to soft BP.  Echo was done that admission, showing improvement in EF to 40-45%.    Echo in 5/19 showed EF 40%, moderate LVH, moderate diastolic dysfunction, mild to moderately decreased RV systolic function, dilated IVC.   She presents today for followup of CHF.  SBP running 90s-100s when she checks at home. No lightheadedness.  She is short of breath after walking 10-15 feet (walking around house).  This is stable.  No orthopnea/PND.  No chest pain. Weight is down 2 lbs.   Optivol: Personally interrogated and reviewed. No VT/VF. Thoracic impedance decreased with periodic short afib runs.   Labs (3/17): HCT 36.9 Labs (5/17): K 4.2, creatinine 0.98 Labs (7/17): K 4.7, creatinine 1.11, BNP 304 Labs (8/17): K 4, creatinine 0.96, hgb 10.3, LDL 46, M-spike on SPEP.  Labs (9/17): Urine IFE negative, K 4.2, creatinine 1.2, hgb 12 Labs (10/17): K 3.8, creatinine 0.99 Labs (5/19): K 3.7, creatinine 1.21  ECG (personally  reviewed): A-V sequential pacing  PMH: 1. Type II diabetes: Diet-controlled.  2. GERD: s/p esophageal dilatation.  3. Chronic systolic CHF: 1st noted in 2015. Nonischemic cardiomyopathy.  - Echo (11/15) with EF 40-45%.  - LHC/RHC (12/15): Normal coronaries, EF 30-35%, mean RA 9, PA 44/16 mean 31, unable to obtain PCWP, CI 2.5.  - Developed CHB and had Medtronic CRT-P placed in 2/16. - Echo (4/16) with EF 50-55%.  - Echo (6/17): EF 15-20%, moderate LVh, restrictive diastolic function, mild MR, RV moderately dilated and moderately decreased in function, moderate TR, PASP 46 mmHg.  - Echo (8/17): EF 40-45%, diffuse hypokinesis, mild MR.  - Echo (5/19): EF 40%, moderate LVH, moderate diastolic dysfunction, mild to moderately decreased RV systolic function, dilated IVC. 4. Complete heart block: Medtronic CRT-P in 2/16.  5. OSA: Uses CPAP 6. Asthma 7. Atrial fibrillation: Paroxysmal.  8. H/o CVA.  Possible TIA in 8/17.    - Carotid dopplers (8/17) with 1-39% BICA stenosis.   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   FH: No cardiac problems that she knows of.   Review of systems complete and found to be negative unless listed in HPI.    Current Outpatient Medications  Medication Sig Dispense Refill  . Acetaminophen (TYLENOL) 325 MG CAPS Take 325 mg by mouth daily as needed (for shoulder pain).     Marland Kitchen apixaban (ELIQUIS) 5 MG TABS tablet Take 1 tablet (5  mg total) by mouth 2 (two) times daily. 60 tablet 6  . Ascorbic Acid (VITAMIN C PO) Take 1 tablet by mouth daily. Reported on 12/19/2015    . Bisacodyl (LAXATIVE PO) Take 1 tablet by mouth daily as needed (for constipation).     . CALCIUM PO Take 1 tablet by mouth daily.    . carvedilol (COREG) 6.25 MG tablet TAKE 1 TABLET BY MOUTH TWICE DAILY WITH A MEAL 60 tablet 3  . diclofenac sodium (VOLTAREN) 1 % GEL Apply 2 g topically 3 (three) times daily as needed (pain,  inflammation). 100 g 0  . diphenhydramine-acetaminophen (TYLENOL PM) 25-500 MG TABS tablet Take 1 tablet by mouth at bedtime as needed (sleep).     Marland Kitchen glucose blood test strip Use to test blood sugar twice daily (Accuchek Smartview) 100 each 0  . latanoprost (XALATAN) 0.005 % ophthalmic solution PLACE 1 GTT IN BOTH EYES HS  11  . lisinopril (PRINIVIL,ZESTRIL) 2.5 MG tablet Take 1 tablet (2.5 mg total) by mouth every evening. NEEDS OFFICE VISIT 30 tablet 3  . spironolactone (ALDACTONE) 25 MG tablet Take 1 tablet (25 mg total) by mouth at bedtime. 30 tablet 3  . timolol (TIMOPTIC) 0.5 % ophthalmic solution Place 1 drop into both eyes daily.   11  . torsemide (DEMADEX) 20 MG tablet Take 4 tablets (80 mg total) by mouth daily. 120 tablet 2   No current facility-administered medications for this encounter.    Vitals:   01/26/18 1017  BP: 98/62  Pulse: 61  SpO2: 98%  Weight: 153 lb 9.6 oz (69.7 kg)     Wt Readings from Last 3 Encounters:  01/26/18 153 lb 9.6 oz (69.7 kg)  12/12/17 155 lb 6.4 oz (70.5 kg)  11/28/17 160 lb 6.4 oz (72.8 kg)    Physical Exam General: NAD Neck: JVP 14 cm, no thyromegaly or thyroid nodule.  Lungs: Clear to auscultation bilaterally with normal respiratory effort. CV: Nondisplaced PMI.  Heart regular S1/S2, no S3/S4, no murmur.  1+ edema 1/3 to knees bilaterally.  No carotid bruit.  Normal pedal pulses.  Abdomen: Soft, nontender, no hepatosplenomegaly, no distention.  Skin: Intact without lesions or rashes.  Neurologic: Alert and oriented x 3.  Psych: Normal affect. Extremities: No clubbing or cyanosis.  HEENT: Normal.   Assessment/Plan: 1. Chronic systolic CHF: Nonischemic cardiomyopathy, no coronary disease on cath in 12/15. Etiology uncertain => ?viral myocarditis, also consider cardiac amyloidosis based on appearance of LV myocardium with moderate LVH.  Abnormal SPEP => urine immunofixation normal, polyclonal gammopathy, not suggestive of AL amyloidosis.   She does not remember a family history of cardiomyopathy and has never drunk ETOH heavily.  LV function initially improved in 2016, but EF back down to 15-20% with RV dysfunction on 6/17 echo.  Echo was reviewed today, EF 40% with moderate LVH.  She is volume overloaded on exam with NYHA class III symptoms.  - Stop Lasix.  Start torsemide 80 mg daily with BMET today and again in 10 days.  - Continue Coreg 6.25 mg BID.  - Increase spironolactone to 25 mg daily.  - Continue lisinopril 2.5 mg for now. - I will arrange for nuclear PYP scan to assess for transthyretin amyloidosis.   - Has CRT device, cannot get MRI.  2. Complete heart block: Medtronic CRT-P - Follows with Dr. Lovena Le.  3. Atrial fibrillation: Paroxysmal.  Had suspected TIA in 8/17, suspect that Eliquis was underdosed.  - Continue full dose Eliquis 5mg  BID. Denies  bleeding.  4. OSA: Encouraged nightly CPAP use.   Followup 1 week with APP, 2 months with me.   [name], MD  01/26/2018

## 2018-01-26 NOTE — Patient Instructions (Signed)
Stop Furosemide   Start Torsemide 80 mg (4 tabs) daily  Increase Spironolactone 25 mg (1 tab) every night   You have been referred to have PYP scan done (they will call you)    Labs drawn today (if we do not call you, then your lab work was stable)   Your physician recommends that you return for lab work in: 1 week  Your physician recommends that you schedule a follow-up appointment in: 1 week with APP  Your physician recommends that you schedule a follow-up appointment in: 2 months with Dr. Aundra Dubin

## 2018-01-27 ENCOUNTER — Telehealth (HOSPITAL_COMMUNITY): Payer: Self-pay

## 2018-01-27 MED ORDER — POTASSIUM CHLORIDE CRYS ER 20 MEQ PO TBCR: 20.0000 meq | EXTENDED_RELEASE_TABLET | Freq: Every day | ORAL | 3 refills | Status: DC

## 2018-01-27 NOTE — Telephone Encounter (Signed)
Notes recorded by Shirley Muscat, RN on 01/27/2018 at 10:51 AM EDT Pt aware of results agreeable to med changes (changes made in [facility]) ------  Notes recorded by Shirley Muscat, RN on 01/26/2018 at 4:50 PM EDT Left message to call back   ------  Notes recorded by Larey Dresser, MD on 01/26/2018 at 4:15 PM EDT Add KCl 20 daily

## 2018-01-31 ENCOUNTER — Encounter (HOSPITAL_COMMUNITY): Payer: Medicare Other | Admitting: Cardiology

## 2018-02-01 LAB — HM DIABETES EYE EXAM

## 2018-02-06 NOTE — Progress Notes (Signed)
Advanced Heart Failure Clinic Note   Patient ID: Kim Mullins, female   DOB: [birth year], 84 y.o.   MRN: [id number] PCP: Dr. Ayesha Rumpf Cardiology: Dr. Radford Pax HF Cardiology: Dr. Salem Senate is a 84 y.o. female with history of chronic systolic CHF/nonischemic cardiomyopathy, prior complete heart block, OSA, and paroxysmal atrial fibrillation presents for CHF clinic evaluation.  She was diagnosed with a cardiomyopathy back in 2015.  She had right and left heart cath in 12/15, showing no significant coronary disease.  EF was in the 30-35% range.  She was admitted in 2/16 with symptomatic bradycardia/complete heart block and had Medtronic CRT-P placed.  In 4/16, EF was back up to 50-55%.  However, repeat echo in 6/17 showed EF down to 15-20% with RV dysfunction.    She was admitted in 8/17 with suspected TIA => dysarthria and blurred vision.  She had been on Eliquis 2.5 mg bid, this was increased to 5 mg bid.  Lisinopril was stopped and Coreg was decreased due to soft BP.  Echo was done that admission, showing improvement in EF to 40-45%.    Echo in 5/19 showed EF 40%, moderate LVH, moderate diastolic dysfunction, mild to moderately decreased RV systolic function, dilated IVC.   She presents today for HF follow up. Last week at her visit, she was volume overloaded. She was supposed to switch from lasix to torsemide and spiro was increased. The torsemide was never called in, so she has been taking lasix her lasix as previously ordered 80 mg am, 40 mg pm. She is still SOB with any exertion. She has orthopnea and BLE edema. She is also fatigued and has early satiety. She is wearing her CPAP most nights. She is limiting her fluid and salt intake. Her weights are up 4 lbs at home, but about the same from when she was last seen in clinic last week at 157 lbs. Taking all medications.   Optivol: Thoracic impedence well below threshold. Active ~1 hour/day. She had two episodes of AF/AT lasting  10-15 minutes in June  Labs (3/17): HCT 36.9 Labs (5/17): K 4.2, creatinine 0.98 Labs (7/17): K 4.7, creatinine 1.11, BNP 304 Labs (8/17): K 4, creatinine 0.96, hgb 10.3, LDL 46, M-spike on SPEP.  Labs (9/17): Urine IFE negative, K 4.2, creatinine 1.2, hgb 12 Labs (10/17): K 3.8, creatinine 0.99 Labs (5/19): K 3.7, creatinine 1.21  PMH: 1. Type II diabetes: Diet-controlled.  2. GERD: s/p esophageal dilatation.  3. Chronic systolic CHF: 1st noted in 2015. Nonischemic cardiomyopathy.  - Echo (11/15) with EF 40-45%.  - LHC/RHC (12/15): Normal coronaries, EF 30-35%, mean RA 9, PA 44/16 mean 31, unable to obtain PCWP, CI 2.5.  - Developed CHB and had Medtronic CRT-P placed in 2/16. - Echo (4/16) with EF 50-55%.  - Echo (6/17): EF 15-20%, moderate LVh, restrictive diastolic function, mild MR, RV moderately dilated and moderately decreased in function, moderate TR, PASP 46 mmHg.  - Echo (8/17): EF 40-45%, diffuse hypokinesis, mild MR.  - Echo (5/19): EF 40%, moderate LVH, moderate diastolic dysfunction, mild to moderately decreased RV systolic function, dilated IVC. 4. Complete heart block: Medtronic CRT-P in 2/16.  5. OSA: Uses CPAP 6. Asthma 7. Atrial fibrillation: Paroxysmal.  8. H/o CVA.  Possible TIA in 8/17.    - Carotid dopplers (8/17) with 1-39% BICA stenosis.   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   FH:  No cardiac problems that she knows of.   Review of systems complete and found to be negative unless listed in HPI.   Current Outpatient Medications  Medication Sig Dispense Refill  . Acetaminophen (TYLENOL) 325 MG CAPS Take 325 mg by mouth daily as needed (for shoulder pain).     Marland Kitchen apixaban (ELIQUIS) 5 MG TABS tablet Take 1 tablet (5 mg total) by mouth 2 (two) times daily. 60 tablet 6  . Ascorbic Acid (VITAMIN C PO) Take 1 tablet by mouth daily. Reported on 12/19/2015    . Bisacodyl (LAXATIVE PO) Take 1  tablet by mouth daily as needed (for constipation).     . CALCIUM PO Take 1 tablet by mouth daily.    . carvedilol (COREG) 6.25 MG tablet TAKE 1 TABLET BY MOUTH TWICE DAILY WITH A MEAL 60 tablet 3  . diclofenac sodium (VOLTAREN) 1 % GEL Apply 2 g topically 3 (three) times daily as needed (pain, inflammation). 100 g 0  . diphenhydramine-acetaminophen (TYLENOL PM) 25-500 MG TABS tablet Take 1 tablet by mouth at bedtime as needed (sleep).     . furosemide (LASIX) 40 MG tablet Take 2 tabs in AM and 1 tab in PM    . glucose blood test strip Use to test blood sugar twice daily (Accuchek Smartview) 100 each 0  . latanoprost (XALATAN) 0.005 % ophthalmic solution PLACE 1 GTT IN BOTH EYES HS  11  . lisinopril (PRINIVIL,ZESTRIL) 2.5 MG tablet Take 1 tablet (2.5 mg total) by mouth every evening. NEEDS OFFICE VISIT 30 tablet 3  . potassium chloride SA (K-DUR,KLOR-CON) 20 MEQ tablet Take 1 tablet (20 mEq total) by mouth daily. 30 tablet 3  . spironolactone (ALDACTONE) 25 MG tablet Take 1 tablet (25 mg total) by mouth at bedtime. 30 tablet 3  . timolol (TIMOPTIC) 0.5 % ophthalmic solution Place 1 drop into both eyes daily.   11   No current facility-administered medications for this encounter.    Vitals:   02/07/18 1112  BP: (!) 94/56  Pulse: 62  SpO2: 99%  Weight: 159 lb 6 oz (72.3 kg)     Wt Readings from Last 3 Encounters:  02/07/18 159 lb 6 oz (72.3 kg)  01/26/18 153 lb 9.6 oz (69.7 kg)  12/12/17 155 lb 6.4 oz (70.5 kg)    Physical Exam General: No resp difficulty. Arrived in wheelchair.  HEENT: Normal Neck: Supple. JVP to jaw. Carotids 2+ bilat; no bruits. No thyromegaly or nodule noted. Cor: PMI nondisplaced. RRR, No M/G/R noted Lungs: fine crackles RLL.  Abdomen: Soft, non-tender, non-distended, no HSM. No bruits or masses. +BS  Extremities: No cyanosis, clubbing, or rash. R and LLE 1+ edema, warm Neuro: Alert & orientedx3, cranial nerves grossly intact. moves all 4 extremities w/o  difficulty. Affect pleasant   Assessment/Plan: 1. Chronic systolic CHF: Nonischemic cardiomyopathy, no coronary disease on cath in 12/15. Etiology uncertain => ?viral myocarditis, also consider cardiac amyloidosis based on appearance of LV myocardium with moderate LVH.  Abnormal SPEP => urine immunofixation normal, polyclonal gammopathy, not suggestive of AL amyloidosis.  She does not remember a family history of cardiomyopathy and has never drunk ETOH heavily.  LV function initially improved in 2016, but EF back down to 15-20% with RV dysfunction on 6/17 echo.  Echo 01/26/18: EF 40% with moderate LVH.   - NYHA class III symptoms - Volume status elevated on exam and on corvue - Start torsemide 80 mg daily. Continue 20 meq K daily. BMET today.  -  Continue Coreg 6.25 mg BID.  - Continue spironolactone 25 mg daily.  - Continue lisinopril 2.5 mg for now. - PYP scan to assess for transthyretin amyloidosis.  Ordered, not yet scheduled.  - Has CRT device, cannot get MRI.  - Encouraged her to call us if symptoms do not improve in the next few days.  2. Complete heart block: Medtronic CRT-P - Follows with Dr. Lovena Le. No change 3. Atrial fibrillation: Paroxysmal.  Had suspected TIA in 8/17, suspect that Eliquis was underdosed.  - Continue full dose Eliquis 5mg  BID. Denies bleeding - She had two longer episodes of AF/AT on device interrogation, lasting 10-15 minutes with rates 70s 4. OSA: Encouraged nightly CPAP use. No change.    BMET today Start Torsemide 80 mg daily Follow up in APP clinic in 1-2 weeks  Georgiana Shore, NP  02/07/2018   Greater than 50% of the 25 minute visit was spent in counseling/coordination of care regarding disease state education, salt/fluid restriction, sliding scale diuretics, and medication compliance.

## 2018-02-07 ENCOUNTER — Ambulatory Visit (HOSPITAL_COMMUNITY)
Admission: RE | Admit: 2018-02-07 | Discharge: 2018-02-07 | Disposition: A | Discharge status: Home / Self Care | Payer: Medicare Other | Source: Ambulatory Visit | Attending: Cardiology | Admitting: Cardiology

## 2018-02-07 VITALS — BP 94/56 | HR 62 | Wt 159.4 lb

## 2018-02-07 DIAGNOSIS — Z09 Encounter for follow-up examination after completed treatment for conditions other than malignant neoplasm: Secondary | ICD-10-CM | POA: Insufficient documentation

## 2018-02-07 DIAGNOSIS — I480 Paroxysmal atrial fibrillation: Secondary | ICD-10-CM | POA: Diagnosis not present

## 2018-02-07 DIAGNOSIS — I442 Atrioventricular block, complete: Secondary | ICD-10-CM | POA: Diagnosis not present

## 2018-02-07 DIAGNOSIS — Z79899 Other long term (current) drug therapy: Secondary | ICD-10-CM | POA: Diagnosis not present

## 2018-02-07 DIAGNOSIS — I429 Cardiomyopathy, unspecified: Secondary | ICD-10-CM | POA: Diagnosis not present

## 2018-02-07 DIAGNOSIS — Z8673 Personal history of transient ischemic attack (TIA), and cerebral infarction without residual deficits: Secondary | ICD-10-CM | POA: Insufficient documentation

## 2018-02-07 DIAGNOSIS — E119 Type 2 diabetes mellitus without complications: Secondary | ICD-10-CM | POA: Diagnosis not present

## 2018-02-07 DIAGNOSIS — I5022 Chronic systolic (congestive) heart failure: Secondary | ICD-10-CM | POA: Insufficient documentation

## 2018-02-07 DIAGNOSIS — G4733 Obstructive sleep apnea (adult) (pediatric): Secondary | ICD-10-CM | POA: Diagnosis not present

## 2018-02-07 LAB — BASIC METABOLIC PANEL
Anion gap: 10 (ref 5–15)
BUN: 13 mg/dL (ref 6–20)
CALCIUM: 8.9 mg/dL (ref 8.9–10.3)
CO2: 22 mmol/L (ref 22–32)
CREATININE: 1.22 mg/dL — AB (ref 0.44–1.00)
Chloride: 106 mmol/L (ref 101–111)
GFR calc non Af Amer: 39 mL/min — ABNORMAL LOW (ref >60)
GFR, EST AFRICAN AMERICAN: 45 mL/min — AB (ref >60)
GLUCOSE: 108 mg/dL — AB (ref 65–99)
Potassium: 4.1 mmol/L (ref 3.5–5.1)
Sodium: 138 mmol/L (ref 135–145)

## 2018-02-07 MED ORDER — TORSEMIDE 20 MG PO TABS: 80.0000 mg | ORAL_TABLET | Freq: Every day | ORAL | 3 refills | Status: DC

## 2018-02-07 NOTE — Patient Instructions (Signed)
Stop Furosemide  Start Torsemide 80 mg (4 tabs) daily  Labs today  Your physician recommends that you schedule a follow-up appointment in: 1-2 weeks

## 2018-02-07 NOTE — Progress Notes (Signed)
At appt on 5/30 pt was instructed to stop Furosemide and start Torsemide however she states the pharmacy told her they did not get a prescription for that from our office so she did start the Torsemide she just continued to take her Furosemide.  Will f/u on prescription, provider aware.

## 2018-02-08 NOTE — Addendum Note (Signed)
Encounter addended by: Scarlette Calico, RN on: 02/08/2018 4:56 PM  Actions taken: Order list changed

## 2018-02-17 NOTE — Progress Notes (Signed)
Advanced Heart Failure Clinic Note   Patient ID: Kim Mullins, female   DOB: 03-24-1930, 84 y.o.   MRN: [id number] PCP: Dr. Ayesha Rumpf Cardiology: Dr. Radford Pax HF Cardiology: Dr. Salem Kim Mullins is a 84 y.o. female with history of chronic systolic CHF/nonischemic cardiomyopathy, prior complete heart block, OSA, and paroxysmal atrial fibrillation presents for CHF clinic evaluation.  She was diagnosed with a cardiomyopathy back in 2015.  She had right and left heart cath in 12/15, showing no significant coronary disease.  EF was in the 30-35% range.  She was admitted in 2/16 with symptomatic bradycardia/complete heart block and had Medtronic CRT-P placed.  In 4/16, EF was back up to 50-55%.  However, repeat echo in 6/17 showed EF down to 15-20% with RV dysfunction.    She was admitted in 8/17 with suspected TIA => dysarthria and blurred vision.  She had been on Eliquis 2.5 mg bid, this was increased to 5 mg bid.  Lisinopril was stopped and Coreg was decreased due to soft BP.  Echo was done that admission, showing improvement in EF to 40-45%.    Echo in 5/19 showed EF 40%, moderate LVH, moderate diastolic dysfunction, mild to moderately decreased RV systolic function, dilated IVC.   She presents today for HF follow up. Last visit, was volume overloaded and lasix was switched to torsemide (attempted at visit before, but torsemide was not called in). She was unable to get torsemide again, this time due to insurance. Her insurance company called her and said she needs to try bumex before they would approve torsemide. She has continued to take lasix 80 mg am, 40 mg in pm. Her SOB has gotten worse. She is now SOB with ADLs. Sleeps sitting up at baseline. Edema comes and goes, left leg is typically worse than right. Denies CP or dizziness. No bleeding on eliquis. Weights 157-160 lbs at home. Taking all medications.  Optivol: Thoracic impedence well below threshold and trending down. Active  about 1 hr/day.   Labs (3/17): HCT 36.9 Labs (5/17): K 4.2, creatinine 0.98 Labs (7/17): K 4.7, creatinine 1.11, BNP 304 Labs (8/17): K 4, creatinine 0.96, hgb 10.3, LDL 46, M-spike on SPEP.  Labs (9/17): Urine IFE negative, K 4.2, creatinine 1.2, hgb 12 Labs (10/17): K 3.8, creatinine 0.99 Labs (5/19): K 3.7, creatinine 1.21  PMH: 1. Type II diabetes: Diet-controlled.  2. GERD: s/p esophageal dilatation.  3. Chronic systolic CHF: 1st noted in 2015. Nonischemic cardiomyopathy.  - Echo (11/15) with EF 40-45%.  - LHC/RHC (12/15): Normal coronaries, EF 30-35%, mean RA 9, PA 44/16 mean 31, unable to obtain PCWP, CI 2.5.  - Developed CHB and had Medtronic CRT-P placed in 2/16. - Echo (4/16) with EF 50-55%.  - Echo (6/17): EF 15-20%, moderate LVh, restrictive diastolic function, mild MR, RV moderately dilated and moderately decreased in function, moderate TR, PASP 46 mmHg.  - Echo (8/17): EF 40-45%, diffuse hypokinesis, mild MR.  - Echo (5/19): EF 40%, moderate LVH, moderate diastolic dysfunction, mild to moderately decreased RV systolic function, dilated IVC. 4. Complete heart block: Medtronic CRT-P in 2/16.  5. OSA: Uses CPAP 6. Asthma 7. Atrial fibrillation: Paroxysmal.  8. H/o CVA.  Possible TIA in 8/17.    - Carotid dopplers (8/17) with 1-39% BICA stenosis.   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   FH: No cardiac problems that she knows of.   Review  of systems complete and found to be negative unless listed in HPI.   Current Outpatient Medications  Medication Sig Dispense Refill  . Acetaminophen (TYLENOL) 325 MG CAPS Take 325 mg by mouth daily as needed (for shoulder pain).     Marland Kitchen apixaban (ELIQUIS) 5 MG TABS tablet Take 1 tablet (5 mg total) by mouth 2 (two) times daily. 60 tablet 6  . Ascorbic Acid (VITAMIN C PO) Take 1 tablet by mouth daily. Reported on 12/19/2015    . Bisacodyl (LAXATIVE PO) Take 1  tablet by mouth daily as needed (for constipation).     . CALCIUM PO Take 1 tablet by mouth daily.    . carvedilol (COREG) 6.25 MG tablet TAKE 1 TABLET BY MOUTH TWICE DAILY WITH A MEAL 60 tablet 3  . diclofenac sodium (VOLTAREN) 1 % GEL Apply 2 g topically 3 (three) times daily as needed (pain, inflammation). 100 g 0  . diphenhydramine-acetaminophen (TYLENOL PM) 25-500 MG TABS tablet Take 1 tablet by mouth at bedtime as needed (sleep).     . furosemide (LASIX) 40 MG tablet Take 40 mg by mouth. 80 mg in the AM and 40 mg in the PM    . glucose blood test strip Use to test blood sugar twice daily (Accuc[employer]k Smartview) 100 each 0  . latanoprost (XALATAN) 0.005 % ophthalmic solution PLACE 1 GTT IN BOTH EYES HS  11  . lisinopril (PRINIVIL,ZESTRIL) 2.5 MG tablet Take 1 tablet (2.5 mg total) by mouth every evening. NEEDS OFFICE VISIT 30 tablet 3  . potassium chloride SA (K-DUR,KLOR-CON) 20 MEQ tablet Take 1 tablet (20 mEq total) by mouth daily. 30 tablet 3  . spironolactone (ALDACTONE) 25 MG tablet Take 1 tablet (25 mg total) by mouth at bedtime. 30 tablet 3  . timolol (TIMOPTIC) 0.5 % ophthalmic solution Place 1 drop into both eyes daily.   11   No current facility-administered medications for this encounter.    Vitals:   02/20/18 1118  BP: 102/76  Pulse: 74  SpO2: 97%  Weight: 157 lb 9.6 oz (71.5 kg)     Wt Readings from Last 3 Encounters:  02/20/18 157 lb 9.6 oz (71.5 kg)  02/07/18 159 lb 6 oz (72.3 kg)  01/26/18 153 lb 9.6 oz (69.7 kg)    Physical Exam General: No resp difficulty. Arrived in wheelchair.  HEENT: Normal Neck: Supple. JVP to jaw. Carotids 2+ bilat; no bruits. No thyromegaly or nodule noted. Cor: PMI nondisplaced. RRR, No M/G/R noted Lungs: CTAB, normal effort. Abdomen: Soft, non-tender, non-distended, no HSM. No bruits or masses. +BS  Extremities: No cyanosis, clubbing, or rash. R and LLE 1-2+ edema, L > R Neuro: Alert & orientedx3, cranial nerves grossly intact. moves  all 4 extremities w/o difficulty. Affect pleasant   Assessment/Plan: 1. Chronic systolic CHF: Nonischemic cardiomyopathy, no coronary disease on cath in 12/15. Etiology uncertain => ?viral myocarditis, also consider cardiac amyloidosis based on appearance of LV myocardium with moderate LVH.  Abnormal SPEP => urine immunofixation normal, polyclonal gammopathy, not suggestive of AL amyloidosis.  She does not remember a family history of cardiomyopathy and has never drunk ETOH heavily.  LV function initially improved in 2016, but EF back down to 15-20% with RV dysfunction on 6/17 echo.  Echo 01/26/18: EF 40% with moderate LVH.   - Worsened NYHA class IIIb symptoms - Volume status remains elevated - she did not start torsemide due to insurance - DC lasix. Unable to get torsemide (insurance denied unless she trials bumex  first) Start bumex 2 mg BID. Discussed dosing with pharmD. BMET today - Continue Coreg 6.25 mg BID.  - Continue spironolactone 25 mg daily.  - Continue lisinopril 2.5 mg for now. - PYP scan to assess for transthyretin amyloidosis.  Ordered, not yet scheduled.  - Has CRT device, cannot get MRI.  - Instructed her to call us if symptoms do not improve.  2. Complete heart block: Medtronic CRT-P - Follows with Dr. Lovena Le. No change 3. Atrial fibrillation: Paroxysmal.  Had suspected TIA in 8/17, suspect that Eliquis was underdosed.  - Continue full dose Eliquis 5mg  BID. Denies bleeding 4. OSA: Encouraged nightly CPAP use. No change   Stop lasix.  Start bumex 2 mg BID. BMET today Follow up in 1-2 weeks with BMET   Georgiana Shore, NP  02/20/2018   Greater than 50% of the 25 minute visit was spent in counseling/coordination of care regarding disease state education, salt/fluid restriction, sliding scale diuretics, and medication compliance.

## 2018-02-20 ENCOUNTER — Ambulatory Visit (HOSPITAL_COMMUNITY)
Admission: RE | Admit: 2018-02-20 | Discharge: 2018-02-20 | Disposition: A | Discharge status: Home / Self Care | Payer: Medicare Other | Source: Ambulatory Visit | Attending: Internal Medicine | Admitting: Internal Medicine

## 2018-02-20 ENCOUNTER — Encounter (HOSPITAL_COMMUNITY): Payer: Self-pay

## 2018-02-20 VITALS — BP 102/76 | HR 74 | Wt 157.6 lb

## 2018-02-20 DIAGNOSIS — I5022 Chronic systolic (congestive) heart failure: Secondary | ICD-10-CM | POA: Insufficient documentation

## 2018-02-20 DIAGNOSIS — Z87891 Personal history of nicotine dependence: Secondary | ICD-10-CM | POA: Insufficient documentation

## 2018-02-20 DIAGNOSIS — I442 Atrioventricular block, complete: Secondary | ICD-10-CM | POA: Diagnosis not present

## 2018-02-20 DIAGNOSIS — Z8673 Personal history of transient ischemic attack (TIA), and cerebral infarction without residual deficits: Secondary | ICD-10-CM | POA: Insufficient documentation

## 2018-02-20 DIAGNOSIS — E119 Type 2 diabetes mellitus without complications: Secondary | ICD-10-CM | POA: Insufficient documentation

## 2018-02-20 DIAGNOSIS — I480 Paroxysmal atrial fibrillation: Secondary | ICD-10-CM | POA: Insufficient documentation

## 2018-02-20 DIAGNOSIS — G4733 Obstructive sleep apnea (adult) (pediatric): Secondary | ICD-10-CM | POA: Diagnosis not present

## 2018-02-20 DIAGNOSIS — Z79899 Other long term (current) drug therapy: Secondary | ICD-10-CM | POA: Diagnosis not present

## 2018-02-20 DIAGNOSIS — I429 Cardiomyopathy, unspecified: Secondary | ICD-10-CM | POA: Diagnosis not present

## 2018-02-20 LAB — BASIC METABOLIC PANEL
ANION GAP: 11 (ref 5–15)
BUN: 14 mg/dL (ref 6–20)
CHLORIDE: 104 mmol/L (ref 101–111)
CO2: 22 mmol/L (ref 22–32)
Calcium: 8.7 mg/dL — ABNORMAL LOW (ref 8.9–10.3)
Creatinine, Ser: 1.13 mg/dL — ABNORMAL HIGH (ref 0.44–1.00)
GFR calc Af Amer: 50 mL/min — ABNORMAL LOW (ref >60)
GFR calc non Af Amer: 43 mL/min — ABNORMAL LOW (ref >60)
Glucose, Bld: 123 mg/dL — ABNORMAL HIGH (ref 65–99)
POTASSIUM: 4.5 mmol/L (ref 3.5–5.1)
SODIUM: 137 mmol/L (ref 135–145)

## 2018-02-20 MED ORDER — BUMETANIDE 2 MG PO TABS: 2.0000 mg | ORAL_TABLET | Freq: Two times a day (BID) | ORAL | 11 refills | Status: DC

## 2018-02-20 NOTE — Patient Instructions (Signed)
STOP Torsemide.  START Bumex 2 mg tablet twice daily.  Routine lab work today. Will notify you of abnormal results, otherwise no news is good news!  Follow up 2 weeks.  ____________________________________________________________________ Kim Mullins Code: 7218  Take all medication as prescribed the day of your appointment. Bring all medications with you to your appointment.  Do the following things EVERYDAY: 1) Weigh yourself in the morning before breakfast. Write it down and keep it in a log. 2) Take your medicines as prescribed 3) Eat low salt foods-Limit salt (sodium) to 2000 mg per day.  4) Stay as active as you can everyday 5) Limit all fluids for the day to less than 2 liters

## 2018-02-24 NOTE — Addendum Note (Signed)
Encounter addended by: Larey Dresser, MD on: 02/24/2018 6:24 PM  Actions taken: LOS modified

## 2018-02-28 ENCOUNTER — Other Ambulatory Visit: Payer: Self-pay | Admitting: Cardiology

## 2018-03-06 ENCOUNTER — Ambulatory Visit (HOSPITAL_COMMUNITY)
Admission: RE | Admit: 2018-03-06 | Discharge: 2018-03-06 | Disposition: A | Discharge status: Home / Self Care | Payer: Medicare Other | Source: Ambulatory Visit | Attending: Cardiology | Admitting: Cardiology

## 2018-03-06 VITALS — BP 108/70 | HR 68 | Wt 142.0 lb

## 2018-03-06 DIAGNOSIS — R0609 Other forms of dyspnea: Secondary | ICD-10-CM | POA: Insufficient documentation

## 2018-03-06 DIAGNOSIS — I429 Cardiomyopathy, unspecified: Secondary | ICD-10-CM | POA: Insufficient documentation

## 2018-03-06 DIAGNOSIS — I5022 Chronic systolic (congestive) heart failure: Secondary | ICD-10-CM | POA: Diagnosis present

## 2018-03-06 DIAGNOSIS — I442 Atrioventricular block, complete: Secondary | ICD-10-CM | POA: Diagnosis not present

## 2018-03-06 DIAGNOSIS — I480 Paroxysmal atrial fibrillation: Secondary | ICD-10-CM | POA: Insufficient documentation

## 2018-03-06 DIAGNOSIS — G4733 Obstructive sleep apnea (adult) (pediatric): Secondary | ICD-10-CM | POA: Insufficient documentation

## 2018-03-06 LAB — BASIC METABOLIC PANEL
ANION GAP: 14 (ref 5–15)
BUN: 34 mg/dL — ABNORMAL HIGH (ref 8–23)
CO2: 20 mmol/L — ABNORMAL LOW (ref 22–32)
CREATININE: 1.53 mg/dL — AB (ref 0.44–1.00)
Calcium: 9.1 mg/dL (ref 8.9–10.3)
Chloride: 103 mmol/L (ref 98–111)
GFR, EST AFRICAN AMERICAN: 35 mL/min — AB (ref >60)
GFR, EST NON AFRICAN AMERICAN: 30 mL/min — AB (ref >60)
Glucose, Bld: 85 mg/dL (ref 70–99)
Potassium: 4.6 mmol/L (ref 3.5–5.1)
SODIUM: 137 mmol/L (ref 135–145)

## 2018-03-06 NOTE — Patient Instructions (Addendum)
I will call you this afternoon with directions for your medications after lab results are back Hold bumex tomorrow  Decrease to bumex 2mg  daily    Follow Up Pharmacist clinic HF clinic July 22 at 1:15

## 2018-03-06 NOTE — Progress Notes (Signed)
PCP: Dr. Ayesha Rumpf Cardiology: Dr. Radford Pax HF Cardiology: Dr. Aundra Dubin   HPI:   Kim Mullins is a 84 y.o. female with history of chronic systolic CHF/nonischemic cardiomyopathy, prior complete heart block, OSA, and paroxysmal atrial fibrillation presents for CHF clinic evaluation.  She was diagnosed with a cardiomyopathy back in 2015.  She had right and left heart cath in 12/15, showing no significant coronary disease.  EF was in the 30-35% range.  She was admitted in 2/16 with symptomatic bradycardia/complete heart block and had Medtronic CRT-P placed.  In 4/16, EF was back up to 50-55%.  However, repeat echo in 6/17 showed EF down to 15-20% with RV dysfunction.    She was admitted in 8/17 with suspected TIA => dysarthria and blurred vision.  She had been on Eliquis 2.5 mg bid, this was increased to 5 mg bid.  Lisinopril was stopped and Coreg was decreased due to soft BP.  Echo was done that admission, showing improvement in EF to 40-45%.    Echo in 5/19 showed EF 40%, moderate LVH, moderate diastolic dysfunction, mild to moderately decreased RV systolic function, dilated IVC.   She presents today for HF follow up. Last visit, was volume overloaded and lasix was switched to bumetanide  2mg  BID (torsemide attempted but she was unable to get torsemide due to insurance). She was SOB with ADLs but now is feeling much better and can move around house with less SOB.  She is now occasionally dizzing when out and about doing errands - including today - she does not stand well so orthostatics not performed. Her weight is down 15lb from last visit. Sleeps sitting up at baseline.  Denies CP. No bleeding on eliquis. Previous weights 157-160 lbs at home> 142lb here today. Taking all medications     . Shortness of breath/dyspnea on exertion? Yes with exertion . Orthopnea/PND? no . Edema? no . Lightheadedness/dizziness? Yes - occasionally  . Daily weights at home? yes . Blood pressure/heart rate  monitoring at home? yes . Following low-sodium/fluid-restricted diet? yes  HF Medications: Bumetanide 2mg  BID Carvedilol 6.25mg  BID Lisinopril 2.5mg  at HS Spironolactone 25mg  Daily  Has the patient been experiencing any side effects to the medications prescribed?  Yes - occasional dizziness   Does the patient have any problems obtaining medications due to transportation or finances?   no  Understanding of regimen: good Understanding of indications: good Potential of compliance: good Patient understands to avoid NSAIDs. Patient understands to avoid decongestants.    Pertinent Lab Values: . Serum creatinine 1.5 (up from 1.1), CO2 22, Potassium 4.6 (stable), Sodium 137,    Vital Signs: . Weight: 142lb (dry weight: 150lb ) . Blood pressure: 108/70 . Heart rate: 68 . O2 98%  Assessment: 1. Chronicsystolic CHF (EF 16%), due to NICM. NYHA class IIIsymptoms.  - volume status improved - weight down 15lbs in 2 weeks, less SOB, no LE edema - will hold diuretic for 1 day then decrease Bumetanide 2mg  daily. -BP soft and occasionally dizzy when standing, HR stable   - no room to increase lisinopril at this time - Cr slight bump 1.1>1.5 with diuresis, K stable   -continue other HF medications Carvedilol 6.25mg  BID Lisinopril 2.5mg  at HS Spironolactone 25mg  Daily  - Basic disease state pathophysiology, medication indication, mechanism and side effects reviewed at length with patient and he verbalized understanding  Complete heart block: Medtronic CRT-P - Follows with Dr. Lovena Le. No change 3. Atrial fibrillation: Paroxysmal.  Had suspected TIA in 8/17, Apixaban  dose increased to 5mg  BID  Denies bleeding 4. OSA: Encouraged nightly CPAP use. No change    Plan: 1) Medication changes: Based on clinical presentation, vital signs and recent labs will   - volume status improved - will hold diuretic for 1 day then decrease Bumetanide 2mg  daily. -Vital signs BP soft and occasionally  dizzy when standing, HR stable   - no room to increase lisinopril at this time - Cr slight bump 1.1>1.5 with diuresis, K stable   -continue other HF medications Carvedilol 6.25mg  BID Lisinopril 2.5mg  at HS Spironolactone 25mg  Daily  2) Labs: BMET with next visit 3) Follow-up: Pharmacist HF Clinic July 22    Bonnita Nasuti Pharm.D. CPP, BCPS Clinical Pharmacist [phone] 03/08/2018 9:01 AM

## 2018-03-17 ENCOUNTER — Encounter: Payer: Self-pay | Admitting: Cardiology

## 2018-03-20 ENCOUNTER — Ambulatory Visit (HOSPITAL_COMMUNITY)
Admission: RE | Admit: 2018-03-20 | Discharge: 2018-03-20 | Disposition: A | Discharge status: Home / Self Care | Payer: Medicare Other | Source: Ambulatory Visit | Attending: Cardiology | Admitting: Cardiology

## 2018-03-20 VITALS — BP 98/62 | HR 62 | Wt 143.0 lb

## 2018-03-20 DIAGNOSIS — I428 Other cardiomyopathies: Secondary | ICD-10-CM | POA: Diagnosis not present

## 2018-03-20 DIAGNOSIS — Z45018 Encounter for adjustment and management of other part of cardiac pacemaker: Secondary | ICD-10-CM | POA: Insufficient documentation

## 2018-03-20 DIAGNOSIS — I442 Atrioventricular block, complete: Secondary | ICD-10-CM | POA: Insufficient documentation

## 2018-03-20 DIAGNOSIS — G4733 Obstructive sleep apnea (adult) (pediatric): Secondary | ICD-10-CM | POA: Insufficient documentation

## 2018-03-20 DIAGNOSIS — I5022 Chronic systolic (congestive) heart failure: Secondary | ICD-10-CM | POA: Diagnosis present

## 2018-03-20 DIAGNOSIS — I480 Paroxysmal atrial fibrillation: Secondary | ICD-10-CM | POA: Insufficient documentation

## 2018-03-20 LAB — BASIC METABOLIC PANEL
Anion gap: 9 (ref 5–15)
BUN: 34 mg/dL — ABNORMAL HIGH (ref 8–23)
CALCIUM: 8.8 mg/dL — AB (ref 8.9–10.3)
CO2: 27 mmol/L (ref 22–32)
CREATININE: 1.97 mg/dL — AB (ref 0.44–1.00)
Chloride: 98 mmol/L (ref 98–111)
GFR, EST AFRICAN AMERICAN: 25 mL/min — AB (ref >60)
GFR, EST NON AFRICAN AMERICAN: 22 mL/min — AB (ref >60)
GLUCOSE: 109 mg/dL — AB (ref 70–99)
Potassium: 4.2 mmol/L (ref 3.5–5.1)
Sodium: 134 mmol/L — ABNORMAL LOW (ref 135–145)

## 2018-03-20 MED ORDER — BUMETANIDE 2 MG PO TABS: 2.0000 mg | ORAL_TABLET | Freq: Every day | ORAL | 6 refills | Status: DC

## 2018-03-20 NOTE — Progress Notes (Signed)
PCP: Dr. Ayesha Rumpf Cardiology: Dr. Radford Pax HF Cardiology: Dr. Aundra Dubin   HPI:   Kim Laurel Wilsonis a 84 y.o.femalewith history of chronic systolic CHF/nonischemic cardiomyopathy, prior complete heart block, OSA, and paroxysmal atrial fibrillation presents for CHF clinic evaluation. She was diagnosed with a cardiomyopathy back in 2015. She had right and left heart cath in 12/15, showing no significant coronary disease. EF was in the 30-35% range. She was admitted in 2/16 with symptomatic bradycardia/complete heart block and had Medtronic CRT-P placed. In 4/16, EF was back up to 50-55%. However, repeat echo in 6/17 showed EF down to 15-20% with RV dysfunction.   She was admitted in 8/17 with suspected TIA =>dysarthria and blurred vision. She had been on Eliquis 2.5 mg bid, this was increased to 5 mg bid. Lisinopril was stopped and Coreg was decreased due to soft BP. Echo was done that admission, showing improvement in EF to 40-45%.   Echo in 5/19 showed EF 40%, moderate LVH, moderate diastolic dysfunction, mild to moderately decreased RV systolic function, dilated IVC.  She recently presented for HF follow up with improved volume status after switched lasix  to bumetanide  2mg  BID (torsemide attempted butshe was unable to get torsemide due to insurance).  Her weight is down 15lb from last visit and Cr had slight bump 1.1>1.5 so bumetanide was held for 1 day and then decreased 2mg  daily.    Today she presents to follow up pharmacist medication titration clinic.  She is feeling good most days and can do ADLs without getting SOB.  If she gets tired or SOB she sits down to rest. She goes to church to play cards and do chair yoga and  Occasionally goes out to eat with friends.  She cooks meat and vegies and low salt food except once a month goes out for ribs. Granddaughter calls daily to check on her. Her weight is stable since last visit.  No edema. No CP. Taking all medications.     . Shortness of breath/dyspnea on exertion? yes does  What she can and then takes a break . Orthopnea/PND? no . Edema? no . Lightheadedness/dizziness? no . Daily weights at home? yes . Blood pressure/heart rate monitoring at home? yes . Following low-sodium/fluid-restricted diet? yes  HF Medications: Carvediolol 6.25mg  BID Lisinopril 2.5mg  Daily Bumentanide 2mg  Daily Spironolactone 25mg  Daily  Has the patient been experiencing any side effects to the medications prescribed?  yes  Does the patient have any problems obtaining medications due to transportation or finances?   no  Understanding of regimen: fair Understanding of indications: fair Potential of compliance: fair Patient understands to avoid NSAIDs. Patient understands to avoid decongestants.    Pertinent Lab Values: . Serum creatinine 1.98 (up from 1.1 6/19) CO2 27 Potassium 4.2 (stable), Sodium 134   Vital Signs: . Weight: 143lb (dry weight: 142lb) . Blood pressure: 98/62 . Heart rate: 62 . O2 98%  Assessment: 1. Chronicsystolic CHF (EF 69%), due to NICM. NYHA class IIIsymptoms. -volume status stable - weight same as last visit, no LE edema,  -BP soft stable 98/62 similar to home readings the last few weeks no room to increase medications -K ok but Cr increased from 1.1 in June to 1.9 today - will hold bumetanide x3 days and then restart 2mg  daily - recheck BMET next week -continue other current HF medications Carvediolol 6.25mg  BID Lisinopril 2.5mg  Daily Spironolactone 25mg  Daily - Basic disease state pathophysiology, medication indication, mechanism and side effects reviewed at length with patient and  he verbalized understanding  2, Complete heart block: Medtronic CRT-P - Follows with Dr. Lovena Le.No change 3. Atrial fibrillation: Paroxysmal. Had suspected TIA in 8/17, Apixaban dose increased to 5mg  BID Denies bleeding 4. OSA: Encouraged nightly CPAP use.No change   Plan: 1) Medication  changes: Based on clinical presentationl  volume status stable  -BP soft stable 98/62 HR 62   - no room to increase HF  medications -K ok but Cr increased from 1.1 in June to 1.9 today - will hold bumetanide x3 days and then restart 2mg  daily  -continue other current HF medications Carvediolol 6.25mg  BID Lisinopril 2.5mg  Daily Spironolactone 25mg  Daily   2) Labs: BMET Monday 7/29 3) Follow-up: Dr. Aundra Dubin 05/23/18 at 1:40   Patient called with new plan after lab results available  Bonnita Nasuti Pharm.D. CPP, BCPS Clinical Pharmacist [phone] 03/20/2018 4:28 PM

## 2018-03-20 NOTE — Patient Instructions (Addendum)
Continue Current medications Continue to weigh every day  Continue to eat low salt  Follow Up with Dr. Aundra Dubin September 24 at 1:40pm

## 2018-03-27 ENCOUNTER — Other Ambulatory Visit (HOSPITAL_COMMUNITY): Payer: Self-pay | Admitting: *Deleted

## 2018-03-27 ENCOUNTER — Other Ambulatory Visit (HOSPITAL_COMMUNITY): Payer: Medicare Other

## 2018-03-27 ENCOUNTER — Other Ambulatory Visit (HOSPITAL_COMMUNITY): Payer: Self-pay

## 2018-03-27 DIAGNOSIS — I5022 Chronic systolic (congestive) heart failure: Secondary | ICD-10-CM

## 2018-03-27 MED ORDER — CARVEDILOL 6.25 MG PO TABS: ORAL_TABLET | ORAL | 5 refills | Status: DC

## 2018-04-04 ENCOUNTER — Other Ambulatory Visit: Payer: Self-pay | Admitting: Family Medicine

## 2018-04-04 ENCOUNTER — Ambulatory Visit
Admission: RE | Admit: 2018-04-04 | Discharge: 2018-04-04 | Disposition: A | Discharge status: Home / Self Care | Payer: Medicare Other | Source: Ambulatory Visit | Attending: Family Medicine | Admitting: Family Medicine

## 2018-04-04 DIAGNOSIS — W19XXXA Unspecified fall, initial encounter: Secondary | ICD-10-CM

## 2018-04-06 ENCOUNTER — Other Ambulatory Visit: Payer: Self-pay

## 2018-04-06 ENCOUNTER — Other Ambulatory Visit: Payer: Medicare Other

## 2018-04-13 ENCOUNTER — Other Ambulatory Visit: Payer: Medicare Other

## 2018-05-23 ENCOUNTER — Ambulatory Visit (HOSPITAL_COMMUNITY)
Admission: RE | Admit: 2018-05-23 | Discharge: 2018-05-23 | Disposition: A | Discharge status: Home / Self Care | Payer: Medicare Other | Source: Ambulatory Visit | Attending: Cardiology | Admitting: Cardiology

## 2018-05-23 VITALS — BP 92/50 | HR 67 | Wt 147.0 lb

## 2018-05-23 DIAGNOSIS — I429 Cardiomyopathy, unspecified: Secondary | ICD-10-CM | POA: Diagnosis present

## 2018-05-23 DIAGNOSIS — E119 Type 2 diabetes mellitus without complications: Secondary | ICD-10-CM | POA: Diagnosis not present

## 2018-05-23 DIAGNOSIS — Z8673 Personal history of transient ischemic attack (TIA), and cerebral infarction without residual deficits: Secondary | ICD-10-CM | POA: Diagnosis not present

## 2018-05-23 DIAGNOSIS — J45909 Unspecified asthma, uncomplicated: Secondary | ICD-10-CM | POA: Insufficient documentation

## 2018-05-23 DIAGNOSIS — I442 Atrioventricular block, complete: Secondary | ICD-10-CM | POA: Insufficient documentation

## 2018-05-23 DIAGNOSIS — Z7901 Long term (current) use of anticoagulants: Secondary | ICD-10-CM | POA: Insufficient documentation

## 2018-05-23 DIAGNOSIS — I5022 Chronic systolic (congestive) heart failure: Secondary | ICD-10-CM | POA: Diagnosis present

## 2018-05-23 DIAGNOSIS — K219 Gastro-esophageal reflux disease without esophagitis: Secondary | ICD-10-CM | POA: Diagnosis not present

## 2018-05-23 DIAGNOSIS — I480 Paroxysmal atrial fibrillation: Secondary | ICD-10-CM | POA: Diagnosis not present

## 2018-05-23 DIAGNOSIS — Z79899 Other long term (current) drug therapy: Secondary | ICD-10-CM | POA: Diagnosis not present

## 2018-05-23 DIAGNOSIS — Z9889 Other specified postprocedural states: Secondary | ICD-10-CM | POA: Diagnosis not present

## 2018-05-23 DIAGNOSIS — G4733 Obstructive sleep apnea (adult) (pediatric): Secondary | ICD-10-CM | POA: Diagnosis not present

## 2018-05-23 LAB — BASIC METABOLIC PANEL
Anion gap: 13 (ref 5–15)
BUN: 61 mg/dL — ABNORMAL HIGH (ref 8–23)
CALCIUM: 9.2 mg/dL (ref 8.9–10.3)
CO2: 26 mmol/L (ref 22–32)
CREATININE: 3.22 mg/dL — AB (ref 0.44–1.00)
Chloride: 97 mmol/L — ABNORMAL LOW (ref 98–111)
GFR calc Af Amer: 14 mL/min — ABNORMAL LOW (ref >60)
GFR calc non Af Amer: 12 mL/min — ABNORMAL LOW (ref >60)
GLUCOSE: 117 mg/dL — AB (ref 70–99)
Potassium: 4.7 mmol/L (ref 3.5–5.1)
Sodium: 136 mmol/L (ref 135–145)

## 2018-05-23 NOTE — Progress Notes (Signed)
Advanced Heart Failure Clinic Note   Patient ID: Kim Mullins, female   DOB: 1930-05-21, 84 y.o.   MRN: [id number] PCP: Dr. Ayesha Rumpf Cardiology: Dr. Radford Pax HF Cardiology: Dr. Salem Senate is a 84 y.o. female with history of chronic systolic CHF/nonischemic cardiomyopathy, prior complete heart block, OSA, and paroxysmal atrial fibrillation presents for CHF clinic evaluation.  She was diagnosed with a cardiomyopathy back in 2015.  She had right and left heart cath in 12/15, showing no significant coronary disease.  EF was in the 30-35% range.  She was admitted in 2/16 with symptomatic bradycardia/complete heart block and had Medtronic CRT-P placed.  In 4/16, EF was back up to 50-55%.  However, repeat echo in 6/17 showed EF down to 15-20% with RV dysfunction.    She was admitted in 8/17 with suspected TIA => dysarthria and blurred vision.  She had been on Eliquis 2.5 mg bid, this was increased to 5 mg bid.  Lisinopril was stopped and Coreg was decreased due to soft BP.  Echo was done that admission, showing improvement in EF to 40-45%.    Echo in 5/19 showed EF 40%, moderate LVH, moderate diastolic dysfunction, mild to moderately decreased RV systolic function, dilated IVC.   Today she returns for HF follow up. Last visit lasix was stopped and she was started on bumex. Complaining of leg fatigue. Two weeks ago she had a fall. She says her legs just gave out. Using a rollator in the house. She also has numerous canes in the house.Denies dizziness. SOB of exertion. Denies PND/orthopnea. Uses CPAP at night. Appetite ok. Scale at home is not working. Taking all medications. She has been taking 2 mg bumex  twice a day instead of daily. . Lives alone and able to drive to appointments.   Optivol: Fluid index flat. Impedance trending back up. Activity < 2 hours per day. No VT. Rare short AF runs. 100% BiV pacing.   Labs (3/17): HCT 36.9 Labs (5/17): K 4.2, creatinine 0.98 Labs (7/17):  K 4.7, creatinine 1.11, BNP 304 Labs (8/17): K 4, creatinine 0.96, hgb 10.3, LDL 46, SPEP with polyclonal gammopathy.  Labs (9/17): Urine IFE negative, K 4.2, creatinine 1.2, hgb 12 Labs (10/17): K 3.8, creatinine 0.99 Labs (5/19): K 3.7, creatinine 1.21 Labs (03/20/2018): Creatinine 1.97  PMH: 1. Type II diabetes: Diet-controlled.  2. GERD: s/p esophageal dilatation.  3. Chronic systolic CHF: 1st noted in 2015. Nonischemic cardiomyopathy.  - Echo (11/15) with EF 40-45%.  - LHC/RHC (12/15): Normal coronaries, EF 30-35%, mean RA 9, PA 44/16 mean 31, unable to obtain PCWP, CI 2.5.  - Developed CHB and had Medtronic CRT-P placed in 2/16. - Echo (4/16) with EF 50-55%.  - Echo (6/17): EF 15-20%, moderate LVh, restrictive diastolic function, mild MR, RV moderately dilated and moderately decreased in function, moderate TR, PASP 46 mmHg.  - Echo (8/17): EF 40-45%, diffuse hypokinesis, mild MR.  - Echo (5/19): EF 40%, moderate LVH, moderate diastolic dysfunction, mild to moderately decreased RV systolic function, dilated IVC. 4. Complete heart block: Medtronic CRT-P in 2/16.  5. OSA: Uses CPAP 6. Asthma 7. Atrial fibrillation: Paroxysmal.  8. H/o CVA.  Possible TIA in 8/17.    - Carotid dopplers (8/17) with 1-39% BICA stenosis.   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   FH: No cardiac problems that she knows of.   Review of systems complete  and found to be negative unless listed in HPI.   Current Outpatient Medications  Medication Sig Dispense Refill  . Acetaminophen (TYLENOL) 325 MG CAPS Take 325 mg by mouth daily as needed (for shoulder pain).     Marland Kitchen apixaban (ELIQUIS) 5 MG TABS tablet Take 1 tablet (5 mg total) by mouth 2 (two) times daily. 60 tablet 6  . Bisacodyl (LAXATIVE PO) Take 1 tablet by mouth daily as needed (for constipation).     . bumetanide (BUMEX) 2 MG tablet Take 1 tablet (2 mg total) by mouth daily.  45 tablet 6  . carvedilol (COREG) 6.25 MG tablet TAKE 1 TABLET BY MOUTH TWICE DAILY WITH A MEAL 60 tablet 5  . diclofenac sodium (VOLTAREN) 1 % GEL Apply 2 g topically 3 (three) times daily as needed (pain, inflammation). 100 g 0  . diphenhydramine-acetaminophen (TYLENOL PM) 25-500 MG TABS tablet Take 1 tablet by mouth at bedtime as needed (sleep).     . fexofenadine (ALLEGRA) 180 MG tablet Take 180 mg by mouth daily.    Marland Kitchen glucose blood test strip Use to test blood sugar twice daily (Accuchek Smartview) 100 each 0  . latanoprost (XALATAN) 0.005 % ophthalmic solution PLACE 1 GTT IN BOTH EYES HS  11  . lisinopril (PRINIVIL,ZESTRIL) 2.5 MG tablet Take 1 tablet (2.5 mg total) by mouth every evening. NEEDS OFFICE VISIT 30 tablet 3  . potassium chloride SA (K-DUR,KLOR-CON) 20 MEQ tablet Take 1 tablet (20 mEq total) by mouth daily. 30 tablet 3  . spironolactone (ALDACTONE) 25 MG tablet Take 1 tablet (25 mg total) by mouth at bedtime. 30 tablet 3  . timolol (TIMOPTIC) 0.5 % ophthalmic solution Place 1 drop into both eyes daily.   11   No current facility-administered medications for this encounter.    Vitals:   05/23/18 1342  BP: (!) 92/50  Pulse: 67  SpO2: 93%  Weight: 66.7 kg (147 lb)     Wt Readings from Last 3 Encounters:  05/23/18 66.7 kg (147 lb)  03/20/18 64.9 kg (143 lb)  03/06/18 64.4 kg (142 lb)    Physical Exam General:  Arrived in a wheel chair.  No resp difficulty HEENT: normal Neck: supple. JVP 6-7 . Carotids 2+ bilat; no bruits. No lymphadenopathy or thryomegaly appreciated. Cor: PMI nondisplaced. Regular rate & rhythm. No rubs, gallops or murmurs. Lungs: clear Abdomen: soft, nontender, nondistended. No hepatosplenomegaly. No bruits or masses. Good bowel sounds. Extremities: no cyanosis, clubbing, rash, edema Neuro: alert & orientedx3, cranial nerves grossly intact. moves all 4 extremities w/o difficulty. Affect pleasant  Assessment/Plan: 1. Chronic systolic CHF:  Nonischemic cardiomyopathy, no coronary disease on cath in 12/15. Etiology uncertain => ?viral myocarditis, also consider cardiac amyloidosis based on appearance of LV myocardium with moderate LVH.  Abnormal SPEP => urine immunofixation normal, polyclonal gammopathy, not suggestive of AL amyloidosis.  She does not remember a family history of cardiomyopathy and has never drunk ETOH heavily.  LV function initially improved in 2016, but EF back down to 15-20% with RV dysfunction on 6/17 echo.  Echo 01/26/18: EF 40% with moderate LVH.   - NYHA IIIb. Seems stable.  - Unable to take start torsemide due to insurance - Volume status stable. For now continue bumex 2 mg twice a day. If creatinine remains high will need to cut back bumex 2 mg daily.  - Continue Coreg 6.25 mg BID.  - Continue spironolactone 25 mg daily.  - Continue lisinopril 2.5 mg for now. - PYP scan  to assess for transthyretin amyloidosis.  Ordered, not yet scheduled.  - Myeloma panel was negative in 2017.  - Has CRT device, cannot get MRI.  2. Complete heart block: Medtronic CRT-P - Follows with Dr. Lovena Le. No change 3. Atrial fibrillation:  Rare short afib runs on device interrogation.  - Paroxysmal.  Had suspected TIA in 8/17, suspect that Eliquis was underdosed.  - Continue full dose Eliquis 5mg  BID. Denies bleeding 4. OSA: Using CPAP every night.   Check BMET .   Follow up in 4 months.   Kim Grinder, NP  05/23/2018   Patient seen with NP, agree with the above note.  She is frail but stable.  Using her rollator or a cane, short of breath if she "overdoes it."    On exam, she is mildly volume overloaded with JVP 8-9 cm but creatinine was 1.97 when last checked.  - BMET today.  - If creatinine higher, may need to cut back a bit on Bumex and/or stop lisinopril.   She is primarily in NSR by device interrogation.  She remains on apixaban for PAF.    She needs workup for transthyretin amyloidosis with PYP scan.  Myeloma panel was  negative.    Further recommendations will be based on MBET .   Followup in 2-3 months.   Kim Mullins 05/24/2018

## 2018-05-23 NOTE — Patient Instructions (Signed)
Labs today (will call for abnormal results, otherwise no news is good news)  PYP Scan has been ordered for you, [facility] will contact you to schedule test.   Follow up in 4 months, please call our clinic in December to schedule your follow up. [phone], Option 3.

## 2018-05-24 ENCOUNTER — Telehealth (HOSPITAL_COMMUNITY): Payer: Self-pay | Admitting: *Deleted

## 2018-05-24 DIAGNOSIS — I5022 Chronic systolic (congestive) heart failure: Secondary | ICD-10-CM

## 2018-05-24 NOTE — Telephone Encounter (Signed)
-----   Message from Larey Dresser, MD sent at 05/23/2018 10:13 PM EDT ----- Stop lisinopril, stop spironolactone, stop bumetanide.  She needs repeat BMET on Friday. Will decide on when to start back on diuretic based on result.

## 2018-05-24 NOTE — Telephone Encounter (Signed)
Notes recorded by Scarlette Calico, RN on 05/24/2018 at 4:54 PM EDT Spoke w/pt, she is aware, agreeable and verbalizes understanding. Also advised pt to stop KCL as level was 4.7 and we stopped the bumetanide. Pt is sch for repeat stat bmet on Fri 9/27 AM.

## 2018-05-25 ENCOUNTER — Encounter: Payer: Self-pay | Admitting: Cardiology

## 2018-05-26 ENCOUNTER — Ambulatory Visit (HOSPITAL_COMMUNITY)
Admission: RE | Admit: 2018-05-26 | Discharge: 2018-05-26 | Disposition: A | Discharge status: Home / Self Care | Payer: Medicare Other | Source: Ambulatory Visit | Attending: Cardiology | Admitting: Cardiology

## 2018-05-26 ENCOUNTER — Telehealth (HOSPITAL_COMMUNITY): Payer: Self-pay | Admitting: *Deleted

## 2018-05-26 DIAGNOSIS — I5022 Chronic systolic (congestive) heart failure: Secondary | ICD-10-CM | POA: Insufficient documentation

## 2018-05-26 LAB — BASIC METABOLIC PANEL
ANION GAP: 8 (ref 5–15)
BUN: 62 mg/dL — ABNORMAL HIGH (ref 8–23)
CALCIUM: 9.2 mg/dL (ref 8.9–10.3)
CO2: 26 mmol/L (ref 22–32)
CREATININE: 2.76 mg/dL — AB (ref 0.44–1.00)
Chloride: 102 mmol/L (ref 98–111)
GFR calc Af Amer: 17 mL/min — ABNORMAL LOW (ref >60)
GFR calc non Af Amer: 15 mL/min — ABNORMAL LOW (ref >60)
Glucose, Bld: 95 mg/dL (ref 70–99)
POTASSIUM: 5.1 mmol/L (ref 3.5–5.1)
Sodium: 136 mmol/L (ref 135–145)

## 2018-05-26 MED ORDER — SPIRONOLACTONE 25 MG PO TABS: 12.5000 mg | ORAL_TABLET | Freq: Every day | ORAL | 3 refills | Status: DC

## 2018-05-26 MED ORDER — BUMETANIDE 2 MG PO TABS: 2.0000 mg | ORAL_TABLET | Freq: Every day | ORAL | Status: DC

## 2018-05-26 NOTE — Telephone Encounter (Signed)
-----   Message from Larey Dresser, MD sent at 05/26/2018  2:14 PM EDT ----- Hold Bumex until Monday, then start 2 mg daily on Monday.  Hold spironolactone until Monday then start 12.5 daily.  No lisinopril for now.  Followup with APP in 1-2 weeks.

## 2018-05-26 NOTE — Telephone Encounter (Signed)
Notes recorded by Scarlette Calico, RN on 05/26/2018 at 4:24 PM EDT Spoke w/pt, she is aware, agreeable and verbalizes understanding, f/u appt sch for 06/06/18, med list updated

## 2018-06-02 ENCOUNTER — Ambulatory Visit
Admission: RE | Admit: 2018-06-02 | Discharge: 2018-06-02 | Disposition: A | Discharge status: Home / Self Care | Payer: Medicare Other | Source: Ambulatory Visit | Attending: Family Medicine | Admitting: Family Medicine

## 2018-06-02 ENCOUNTER — Other Ambulatory Visit: Payer: Self-pay

## 2018-06-06 ENCOUNTER — Telehealth (HOSPITAL_COMMUNITY): Payer: Self-pay

## 2018-06-06 ENCOUNTER — Ambulatory Visit (HOSPITAL_COMMUNITY)
Admission: RE | Admit: 2018-06-06 | Discharge: 2018-06-06 | Disposition: A | Discharge status: Home / Self Care | Payer: Medicare Other | Source: Ambulatory Visit | Attending: Cardiology | Admitting: Cardiology

## 2018-06-06 ENCOUNTER — Encounter (HOSPITAL_COMMUNITY): Payer: Self-pay

## 2018-06-06 VITALS — BP 102/74 | HR 60 | Wt 152.0 lb

## 2018-06-06 DIAGNOSIS — Z950 Presence of cardiac pacemaker: Secondary | ICD-10-CM | POA: Diagnosis not present

## 2018-06-06 DIAGNOSIS — K219 Gastro-esophageal reflux disease without esophagitis: Secondary | ICD-10-CM | POA: Diagnosis not present

## 2018-06-06 DIAGNOSIS — J45909 Unspecified asthma, uncomplicated: Secondary | ICD-10-CM | POA: Diagnosis not present

## 2018-06-06 DIAGNOSIS — I5022 Chronic systolic (congestive) heart failure: Secondary | ICD-10-CM | POA: Diagnosis present

## 2018-06-06 DIAGNOSIS — I442 Atrioventricular block, complete: Secondary | ICD-10-CM | POA: Diagnosis not present

## 2018-06-06 DIAGNOSIS — I428 Other cardiomyopathies: Secondary | ICD-10-CM | POA: Diagnosis not present

## 2018-06-06 DIAGNOSIS — N184 Chronic kidney disease, stage 4 (severe): Secondary | ICD-10-CM | POA: Insufficient documentation

## 2018-06-06 DIAGNOSIS — E1122 Type 2 diabetes mellitus with diabetic chronic kidney disease: Secondary | ICD-10-CM | POA: Diagnosis not present

## 2018-06-06 DIAGNOSIS — Z87891 Personal history of nicotine dependence: Secondary | ICD-10-CM | POA: Diagnosis not present

## 2018-06-06 DIAGNOSIS — Z79899 Other long term (current) drug therapy: Secondary | ICD-10-CM | POA: Diagnosis not present

## 2018-06-06 DIAGNOSIS — Z8673 Personal history of transient ischemic attack (TIA), and cerebral infarction without residual deficits: Secondary | ICD-10-CM | POA: Insufficient documentation

## 2018-06-06 DIAGNOSIS — N179 Acute kidney failure, unspecified: Secondary | ICD-10-CM | POA: Insufficient documentation

## 2018-06-06 DIAGNOSIS — G4733 Obstructive sleep apnea (adult) (pediatric): Secondary | ICD-10-CM | POA: Insufficient documentation

## 2018-06-06 DIAGNOSIS — Z7901 Long term (current) use of anticoagulants: Secondary | ICD-10-CM | POA: Diagnosis not present

## 2018-06-06 DIAGNOSIS — I480 Paroxysmal atrial fibrillation: Secondary | ICD-10-CM | POA: Diagnosis not present

## 2018-06-06 LAB — BASIC METABOLIC PANEL
ANION GAP: 10 (ref 5–15)
BUN: 26 mg/dL — ABNORMAL HIGH (ref 8–23)
CHLORIDE: 105 mmol/L (ref 98–111)
CO2: 25 mmol/L (ref 22–32)
CREATININE: 1.56 mg/dL — AB (ref 0.44–1.00)
Calcium: 9.3 mg/dL (ref 8.9–10.3)
GFR calc non Af Amer: 29 mL/min — ABNORMAL LOW (ref >60)
GFR, EST AFRICAN AMERICAN: 34 mL/min — AB (ref >60)
Glucose, Bld: 130 mg/dL — ABNORMAL HIGH (ref 70–99)
POTASSIUM: 4 mmol/L (ref 3.5–5.1)
SODIUM: 140 mmol/L (ref 135–145)

## 2018-06-06 NOTE — Progress Notes (Signed)
CSW met with patient to discuss social determinants of health.  Patient reports no housing, financial, or transportation concerns at this time and no CSW involvement needed.   CSW will continue to follow in clinic and assist as needed  Jorge Ny, [place] Social Worker [phone]

## 2018-06-06 NOTE — Progress Notes (Signed)
Advanced Heart Failure Clinic Note   Patient ID: Kim Mullins, female   DOB: 11/25/1929, 84 y.o.   MRN: [id number] PCP: Dr. Ayesha Rumpf Cardiology: Dr. Radford Pax HF Cardiology: Dr. Nickolas Madrid [name] is a 84 y.o. female with history of chronic systolic CHF/nonischemic cardiomyopathy, prior complete heart block, OSA, and paroxysmal atrial fibrillation presents for CHF clinic evaluation.  She was diagnosed with a cardiomyopathy back in 2015.  She had right and left heart cath in 12/15, showing no significant coronary disease.  EF was in the 30-35% range.  She was admitted in 2/16 with symptomatic bradycardia/complete heart block and had Medtronic CRT-P placed.  In 4/16, EF was back up to 50-55%.  However, repeat echo in 6/17 showed EF down to 15-20% with RV dysfunction.    She was admitted in 8/17 with suspected TIA => dysarthria and blurred vision.  She had been on Eliquis 2.5 mg bid, this was increased to 5 mg bid.  Lisinopril was stopped and Coreg was decreased due to soft BP.  Echo was done that admission, showing improvement in EF to 40-45%.    Echo in 5/19 showed EF 40%, moderate LVH, moderate diastolic dysfunction, mild to moderately decreased RV systolic function, dilated IVC.   She presents today for 2 week follow up. Last visit labs with severe AKI, multiple meds adjusted. Weight up 5 lbs from last visit. She denies worsening SOB or orthopnea. Using CPAP every night. Does have early satiety. She has some paraesthesias in her hand and feet/legs. She is taking all medications as directed. Her granddaughter helps her with care and getting to appointments at times. No further falls.   Optivol: Index above threshold after uptrend from end of September. Pt activity between 1-2 hours daily. No VT/VF. No AF since July.   Labs (3/17): HCT 36.9 Labs (5/17): K 4.2, creatinine 0.98 Labs (7/17): K 4.7, creatinine 1.11, BNP 304 Labs (8/17): K 4, creatinine 0.96, hgb 10.3, LDL 46, SPEP  with polyclonal gammopathy.  Labs (9/17): Urine IFE negative, K 4.2, creatinine 1.2, hgb 12 Labs (10/17): K 3.8, creatinine 0.99 Labs (5/19): K 3.7, creatinine 1.21 Labs (03/20/2018): Creatinine 1.97  PMH: 1. Type II diabetes: Diet-controlled.  2. GERD: s/p esophageal dilatation.  3. Chronic systolic CHF: 1st noted in 2015. Nonischemic cardiomyopathy.  - Echo (11/15) with EF 40-45%.  - LHC/RHC (12/15): Normal coronaries, EF 30-35%, mean RA 9, PA 44/16 mean 31, unable to obtain PCWP, CI 2.5.  - Developed CHB and had Medtronic CRT-P placed in 2/16. - Echo (4/16) with EF 50-55%.  - Echo (6/17): EF 15-20%, moderate LVh, restrictive diastolic function, mild MR, RV moderately dilated and moderately decreased in function, moderate TR, PASP 46 mmHg.  - Echo (8/17): EF 40-45%, diffuse hypokinesis, mild MR.  - Echo (5/19): EF 40%, moderate LVH, moderate diastolic dysfunction, mild to moderately decreased RV systolic function, dilated IVC. 4. Complete heart block: Medtronic CRT-P in 2/16.  5. OSA: Uses CPAP 6. Asthma 7. Atrial fibrillation: Paroxysmal.  8. H/o CVA.  Possible TIA in 8/17.    - Carotid dopplers (8/17) with 1-39% BICA stenosis.   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   FH: No cardiac problems that she knows of.   Review of systems complete and found to be negative unless listed in HPI.    Current Outpatient Medications  Medication Sig Dispense Refill  . Acetaminophen (TYLENOL) 325 MG  CAPS Take 325 mg by mouth daily as needed (for shoulder pain).     Marland Kitchen apixaban (ELIQUIS) 5 MG TABS tablet Take 1 tablet (5 mg total) by mouth 2 (two) times daily. 60 tablet 6  . Bisacodyl (LAXATIVE PO) Take 1 tablet by mouth daily as needed (for constipation).     . bumetanide (BUMEX) 2 MG tablet Take 1 tablet (2 mg total) by mouth daily.    . carvedilol (COREG) 6.25 MG tablet TAKE 1 TABLET BY MOUTH TWICE DAILY WITH A MEAL 60  tablet 5  . diclofenac sodium (VOLTAREN) 1 % GEL Apply 2 g topically 3 (three) times daily as needed (pain, inflammation). 100 g 0  . diphenhydramine-acetaminophen (TYLENOL PM) 25-500 MG TABS tablet Take 1 tablet by mouth at bedtime as needed (sleep).     . fexofenadine (ALLEGRA) 180 MG tablet Take 180 mg by mouth daily.    Marland Kitchen glucose blood test strip Use to test blood sugar twice daily (Accuchek Smartview) 100 each 0  . latanoprost (XALATAN) 0.005 % ophthalmic solution PLACE 1 GTT IN BOTH EYES HS  11  . spironolactone (ALDACTONE) 25 MG tablet Take 0.5 tablets (12.5 mg total) by mouth at bedtime. 30 tablet 3  . timolol (TIMOPTIC) 0.5 % ophthalmic solution Place 1 drop into both eyes daily.   11   No current facility-administered medications for this encounter.    Vitals:   06/06/18 1337  BP: 102/74  Pulse: 60  Weight: 68.9 kg (152 lb)    Wt Readings from Last 3 Encounters:  06/06/18 68.9 kg (152 lb)  05/23/18 66.7 kg (147 lb)  03/20/18 64.9 kg (143 lb)    Physical Exam General: Well appearing. No resp difficulty. HEENT: Normal Neck: Supple. JVP 5-6. Carotids 2+ bilat; no bruits. No thyromegaly or nodule noted. Cor: PMI nondisplaced. RRR, No M/G/R noted Lungs: CTAB, normal effort. Abdomen: Soft, non-tender, non-distended, no HSM. No bruits or masses. +BS  Extremities: No cyanosis, clubbing, or rash. R and LLE no edema.  Neuro: Alert & orientedx3, cranial nerves grossly intact. moves all 4 extremities w/o difficulty. Affect pleasant   Assessment/Plan: 1. Chronic systolic CHF: Nonischemic cardiomyopathy, no coronary disease on cath in 12/15. Etiology uncertain => ?viral myocarditis, also consider cardiac amyloidosis based on appearance of LV myocardium with moderate LVH.  Abnormal SPEP => urine immunofixation normal, polyclonal gammopathy, not suggestive of AL amyloidosis.  She does not remember a family history of cardiomyopathy and has never drunk ETOH heavily.  LV function initially  improved in 2016, but EF back down to 15-20% with RV dysfunction on 6/17 echo.  Echo 01/26/18: EF 40% with moderate LVH.   - NYHA III symptoms chronically - Volume status stable on exam, though mildly elevated on Optivol. ReDs vest reading 32%.   - Continue bumex 2 mg daily. Recently had AKI.  - Unable to take torsemide due to insurance - Continue Coreg 6.25 mg BID.  - Continue spironolactone 25 mg daily.  - Continue lisinopril 2.5 mg for now. - PYP scan to assess for transthyretin amyloidosis. Scheduled for 06/08/18.  - Myeloma panel was negative in 2017.  - Has CRT device, cannot get MRI.  2. Complete heart block: Medtronic CRT-P - Follows with Dr. Lovena Le. No change.  3. Atrial fibrillation:  - Rare short afib runs on device interrogation.  - Paroxysmal.  Had suspected TIA in 8/17, suspect that Eliquis was underdosed.  - Continue full dose Eliquis 5mg  BID. Denies bleeding.  4. OSA:  -  Continue nightly CPAP.  5. AKI on CKD III-IV - Creatinine recently as high as 3.22 - BMET today with recent changes.   With recent, severe AKI, leave meds alone. RTC 3 weeks for close follow up, sooner with symptoms. Discussed with Dr. Leo Rod, PA-C  06/06/2018   Greater than 50% of the 25 minute visit was spent in counseling/coordination of care regarding disease state education, salt/fluid restriction, sliding scale diuretics, and medication compliance.

## 2018-06-06 NOTE — Telephone Encounter (Signed)
Encounter complete. 

## 2018-06-06 NOTE — Patient Instructions (Addendum)
Today you have been seen at the Heart failure clinic at [facility]   Lab work done today: BMET  Follow up with Kiefer PA  in 3 weeks.  June 27, 2018  At 2:00 pm

## 2018-06-06 NOTE — Progress Notes (Signed)
ReDS Vest - 06/06/18 1400      ReDS Vest   Fitting Posture  Sitting    Height Marker  Short   station A   Ruler Value  29    Center Strip  Aligned    ReDS Value  32

## 2018-06-08 ENCOUNTER — Ambulatory Visit (HOSPITAL_COMMUNITY)
Admission: RE | Admit: 2018-06-08 | Discharge: 2018-06-08 | Disposition: A | Discharge status: Home / Self Care | Payer: Medicare Other | Source: Ambulatory Visit | Attending: Internal Medicine | Admitting: Internal Medicine

## 2018-06-08 DIAGNOSIS — I5022 Chronic systolic (congestive) heart failure: Secondary | ICD-10-CM

## 2018-06-08 MED ORDER — TECHNETIUM TC 99M PYROPHOSPHATE
21.5000 | Freq: Once | INTRAVENOUS | Status: AC
  Administered 2018-06-08: 21.5 via INTRAVENOUS

## 2018-06-20 ENCOUNTER — Other Ambulatory Visit: Payer: Self-pay | Admitting: Adult Health

## 2018-06-24 ENCOUNTER — Other Ambulatory Visit: Payer: Self-pay

## 2018-06-24 ENCOUNTER — Emergency Department (HOSPITAL_COMMUNITY)
Admission: EM | Admit: 2018-06-24 | Discharge: 2018-06-25 | Disposition: A | Discharge status: Home / Self Care | Payer: Medicare Other | Attending: Emergency Medicine | Admitting: Emergency Medicine

## 2018-06-24 ENCOUNTER — Encounter (HOSPITAL_COMMUNITY): Payer: Self-pay | Admitting: Emergency Medicine

## 2018-06-24 ENCOUNTER — Emergency Department (HOSPITAL_COMMUNITY): Payer: Medicare Other

## 2018-06-24 DIAGNOSIS — Z96641 Presence of right artificial hip joint: Secondary | ICD-10-CM | POA: Diagnosis not present

## 2018-06-24 DIAGNOSIS — Z7901 Long term (current) use of anticoagulants: Secondary | ICD-10-CM | POA: Diagnosis not present

## 2018-06-24 DIAGNOSIS — I110 Hypertensive heart disease with heart failure: Secondary | ICD-10-CM | POA: Insufficient documentation

## 2018-06-24 DIAGNOSIS — J45909 Unspecified asthma, uncomplicated: Secondary | ICD-10-CM | POA: Diagnosis not present

## 2018-06-24 DIAGNOSIS — Z950 Presence of cardiac pacemaker: Secondary | ICD-10-CM | POA: Insufficient documentation

## 2018-06-24 DIAGNOSIS — Z87891 Personal history of nicotine dependence: Secondary | ICD-10-CM | POA: Diagnosis not present

## 2018-06-24 DIAGNOSIS — Z96611 Presence of right artificial shoulder joint: Secondary | ICD-10-CM | POA: Insufficient documentation

## 2018-06-24 DIAGNOSIS — Z79899 Other long term (current) drug therapy: Secondary | ICD-10-CM | POA: Diagnosis not present

## 2018-06-24 DIAGNOSIS — I5022 Chronic systolic (congestive) heart failure: Secondary | ICD-10-CM | POA: Insufficient documentation

## 2018-06-24 DIAGNOSIS — E119 Type 2 diabetes mellitus without complications: Secondary | ICD-10-CM | POA: Diagnosis not present

## 2018-06-24 DIAGNOSIS — R109 Unspecified abdominal pain: Secondary | ICD-10-CM

## 2018-06-24 DIAGNOSIS — R1030 Lower abdominal pain, unspecified: Secondary | ICD-10-CM | POA: Insufficient documentation

## 2018-06-24 LAB — URINALYSIS, ROUTINE W REFLEX MICROSCOPIC
BILIRUBIN URINE: NEGATIVE
Glucose, UA: NEGATIVE mg/dL
Ketones, ur: NEGATIVE mg/dL
Nitrite: NEGATIVE
PROTEIN: NEGATIVE mg/dL
Specific Gravity, Urine: 1.013 (ref 1.005–1.030)
pH: 6 (ref 5.0–8.0)

## 2018-06-24 LAB — CBC
HEMATOCRIT: 41 % (ref 36.0–46.0)
Hemoglobin: 12.3 g/dL (ref 12.0–15.0)
MCH: 29.4 pg (ref 26.0–34.0)
MCHC: 30 g/dL (ref 30.0–36.0)
MCV: 97.9 fL (ref 80.0–100.0)
NRBC: 0 % (ref 0.0–0.2)
Platelets: 163 10*3/uL (ref 150–400)
RBC: 4.19 MIL/uL (ref 3.87–5.11)
RDW: 14.6 % (ref 11.5–15.5)
WBC: 5.7 10*3/uL (ref 4.0–10.5)

## 2018-06-24 LAB — LIPASE, BLOOD: LIPASE: 40 U/L (ref 11–51)

## 2018-06-24 LAB — COMPREHENSIVE METABOLIC PANEL
ALBUMIN: 3.9 g/dL (ref 3.5–5.0)
ALT: 14 U/L (ref 0–44)
AST: 26 U/L (ref 15–41)
Alkaline Phosphatase: 80 U/L (ref 38–126)
Anion gap: 7 (ref 5–15)
BUN: 25 mg/dL — AB (ref 8–23)
CHLORIDE: 103 mmol/L (ref 98–111)
CO2: 26 mmol/L (ref 22–32)
Calcium: 8.5 mg/dL — ABNORMAL LOW (ref 8.9–10.3)
Creatinine, Ser: 1.48 mg/dL — ABNORMAL HIGH (ref 0.44–1.00)
GFR calc non Af Amer: 31 mL/min — ABNORMAL LOW (ref >60)
GFR, EST AFRICAN AMERICAN: 36 mL/min — AB (ref >60)
GLUCOSE: 142 mg/dL — AB (ref 70–99)
Potassium: 3.4 mmol/L — ABNORMAL LOW (ref 3.5–5.1)
SODIUM: 136 mmol/L (ref 135–145)
Total Bilirubin: 2.7 mg/dL — ABNORMAL HIGH (ref 0.3–1.2)
Total Protein: 7.3 g/dL (ref 6.5–8.1)

## 2018-06-24 NOTE — ED Notes (Signed)
Urine culture sent down to lab with urinalysis. 

## 2018-06-24 NOTE — ED Triage Notes (Signed)
Per [facility] pt from home for abd pains and constipation. Denies n/v/d or urinary problems.

## 2018-06-24 NOTE — ED Notes (Signed)
Requested patient to urinate. Patient states she does not have to urinate.

## 2018-06-24 NOTE — ED Provider Notes (Signed)
[facility] DEPT Provider Note   CSN: [id number] Arrival date & time: 06/24/18  1719     History   Chief Complaint Chief Complaint  Patient presents with  . Abdominal Pain    HPI Kim Mullins is a 84 y.o. female.  Patient here for evaluation of severe abdominal pain that started this afternoon around 3:30 pm described as cramping across her lower abdomen. She has an episode of nausea with non-bloody vomiting after arrival here but no N, V before or after this episode. No fever. She reports she took a Dulcolax last night for recurrent constipation with no bowel movement since Wednesday (3 days ago) and had a large, painful bowel movement this morning. She denies urinary symptoms, chest pain, shortness or breath. The abdominal pain was non-radiating. She has no pain or nausea now.   The history is provided by the patient. No language interpreter was used.    Past Medical History:  Diagnosis Date  . Anemia    occassionally  . Arthritis    all over.  . Asthma    Allergixc reaction to cats.  . Atrial fibrillation ([place])   . Bowel obstruction ([place])   . Chronic systolic CHF (congestive heart failure) (HCC)    EF 30-35% by cath, echo 2016 EF 50%  . Complete heart block (HCC)    a. s/p MDT CRTP pacemaker  . DCM (dilated cardiomyopathy) ([place]) 08/16/2014   normal coronary arteries on cath with EF 30-45%.  EF now 50% by echo 11/2014  . Diabetes mellitus 2006   Diet and exercise controlled.  Marland Kitchen GERD (gastroesophageal reflux disease)    occ  . Hypertension   . Left tibial fracture 2007  . OSA (obstructive sleep apnea) 10/23/2014   Moderate with AHI 21/hr  . Osteoarthritis of right shoulder region 06/26/2013  . Stroke [facility])     Patient Active Problem List   Diagnosis Date Noted  . Gait abnormality 05/03/2017  . Hypotension 04/14/2016  . Hypotension due to drugs   . Chronic anticoagulation   . TIA (transient ischemic attack) 04/12/2016  .  UTI (lower urinary tract infection) 04/12/2016  . AKI (acute kidney injury) ([place]) 04/12/2016  . Atrial fibrillation ([place]) 09/12/2015  . Occipital infarction ([place])   . History of stroke 06/18/2015  . Homonymous hemianopsia   . Pacemaker 01/21/2015  . OSA (obstructive sleep apnea) 10/23/2014  . Dizziness 10/21/2014  . Complete heart block ([place]) 10/06/2014  . Benign essential HTN 10/04/2014  . Chronic systolic CHF (congestive heart failure) ([place]) 10/04/2014  . Abnormal cardiovascular stress test 08/27/2014  . Pulmonary HTN ([place]) 08/16/2014  . DCM (dilated cardiomyopathy) ([place]) 08/16/2014  . Cough 07/13/2014  . Esophageal stricture 06/11/2014  . Nonspecific (abnormal) findings on radiological and other examination of gastrointestinal tract 05/29/2014  . Dyspnea 04/29/2014  . Osteoarthritis of right shoulder region 06/26/2013  . DJD (degenerative joint disease) of hip 11/16/2011    Class: Present on Admission    Past Surgical History:  Procedure Laterality Date  . ABDOMINAL HYSTERECTOMY  1969  . BI-VENTRICULAR PACEMAKER INSERTION N/A 10/07/2014   MDT CRTP implanted by Dr Lovena Le  . BRAVO [place] STUDY N/A 06/11/2014   Procedure: BRAVO [place] STUDY;  Surgeon: Inda Castle, MD;  Location: [facility] ENDOSCOPY;  Service: Endoscopy;  Laterality: N/A;  . BUNIONECTOMY  1984   Bilateral  . CARDIAC CATHETERIZATION     normal coronary arteries  . Carpal [place]  2004   Bilateral  . CATARACT  EXTRACTION Right    early 2015  . corn removal  1999   Bilateral feet  . DILATION AND CURETTAGE OF UTERUS  1968  . ESOPHAGOGASTRODUODENOSCOPY (EGD) WITH PROPOFOL N/A 06/11/2014   Procedure: ESOPHAGOGASTRODUODENOSCOPY (EGD) WITH PROPOFOL;  Surgeon: Inda Castle, MD;  Location: [facility] ENDOSCOPY;  Service: Endoscopy;  Laterality: N/A;  . [place]   Surgery to fix Retainal detachment, bilateral  . JOINT REPLACEMENT    . LEFT AND RIGHT HEART CATHETERIZATION WITH CORONARY ANGIOGRAM N/A 08/29/2014   Procedure:  LEFT AND RIGHT HEART CATHETERIZATION WITH CORONARY ANGIOGRAM;  Surgeon: Peter M Martinique, MD;  Location: [facility] CATH LAB;  Service: Cardiovascular;  Laterality: N/A;  . MALONEY DILATION  06/11/2014   Procedure: Venia Minks DILATION;  Surgeon: Inda Castle, MD;  Location: [facility] ENDOSCOPY;  Service: Endoscopy;;  . PILONIDAL CYST EXCISION  1959  . TONSILLECTOMY  1942  . TOTAL HIP ARTHROPLASTY  11/16/2011   Procedure: TOTAL HIP ARTHROPLASTY ANTERIOR APPROACH;  Surgeon: Hessie Dibble, MD;  Location: [facility];  Service: Orthopedics;  Laterality: Right;  DEPUY  . TOTAL SHOULDER ARTHROPLASTY Right 06/26/2013   Procedure: TOTAL SHOULDER ARTHROPLASTY;  Surgeon: Johnny Bridge, MD;  Location: [facility];  Service: Orthopedics;  Laterality: Right;     OB History   None      Home Medications    Prior to Admission medications   Medication Sig Start Date End Date Taking? Authorizing Provider  Acetaminophen (TYLENOL) 325 MG CAPS Take 325 mg by mouth daily as needed (for shoulder pain).     [provider]  apixaban (ELIQUIS) 5 MG TABS tablet Take 1 tablet (5 mg total) by mouth 2 (two) times daily. 01/03/18   Larey Dresser, MD  Bisacodyl (LAXATIVE PO) Take 1 tablet by mouth daily as needed (for constipation).     [provider]  bumetanide (BUMEX) 2 MG tablet Take 1 tablet (2 mg total) by mouth daily. 05/26/18   Larey Dresser, MD  carvedilol (COREG) 6.25 MG tablet TAKE 1 TABLET BY MOUTH TWICE DAILY WITH A MEAL 03/27/18   Larey Dresser, MD  diclofenac sodium (VOLTAREN) 1 % GEL Apply 2 g topically 3 (three) times daily as needed (pain, inflammation). 04/16/16   Roxan Hockey, MD  diphenhydramine-acetaminophen (TYLENOL PM) 25-500 MG TABS tablet Take 1 tablet by mouth at bedtime as needed (sleep).     [provider]  fexofenadine (ALLEGRA) 180 MG tablet Take 180 mg by mouth daily.    [provider]  glucose blood test strip Use to test blood sugar twice daily (Accuchek  Smartview) 07/19/16   Larey Dresser, MD  latanoprost (XALATAN) 0.005 % ophthalmic solution PLACE 1 GTT IN BOTH EYES HS 10/20/16   [provider]  spironolactone (ALDACTONE) 25 MG tablet Take 0.5 tablets (12.5 mg total) by mouth at bedtime. 05/26/18   Larey Dresser, MD  timolol (TIMOPTIC) 0.5 % ophthalmic solution Place 1 drop into both eyes daily.  07/29/15   [provider]    Family History Family History  Problem Relation Age of Onset  . Dementia Mother   . Coronary artery disease Mother   . Cancer Father        blood ? type  . Diabetes Maternal Grandfather   . Anuerysm Daughter        brain  . Colon cancer Neg Hx     Social History Social History   Tobacco Use  . Smoking status: Former  Smoker    Packs/day: 1.00    Years: 45.00    Pack years: 45.00    Types: Cigarettes    Last attempt to quit: 07/24/1978    Years since quitting: 39.9  . Smokeless tobacco: Never Used  Substance Use Topics  . Alcohol use: Yes    Alcohol/week: 7.0 standard drinks    Types: 7 Glasses of wine per week    Comment: every day  . Drug use: No     Allergies   Hydrocodone; Percocet [oxycodone-acetaminophen]; Eggs or egg-derived products; Mercurial derivatives; Penicillins; and Quinine derivatives   Review of Systems Review of Systems  Constitutional: Negative for fever.  Respiratory: Negative.  Negative for shortness of breath.   Cardiovascular: Negative.  Negative for chest pain.  Gastrointestinal: Positive for abdominal pain, constipation, nausea and vomiting. Negative for diarrhea.  Genitourinary: Negative.  Negative for dysuria and frequency.  Musculoskeletal: Negative.  Negative for back pain.  Neurological: Negative.  Negative for weakness and light-headedness.     Physical Exam Updated Vital Signs BP 110/76 (BP Location: Right Arm)   Pulse (!) 58   Temp (!) 97.4 F (36.3 C) (Oral) Comment: Tim triage RN aware  Resp 16   Ht 5\' 3"  (1.6 m)   Wt 68 kg    SpO2 98%   BMI 26.57 kg/m   Physical Exam  Constitutional: She appears well-developed and well-nourished.  HENT:  Head: Normocephalic.  Neck: Normal range of motion. Neck supple.  Cardiovascular: Normal rate and regular rhythm.  Pulmonary/Chest: Effort normal and breath sounds normal.  Abdominal: Soft. Bowel sounds are normal. She exhibits no distension and no mass. There is no tenderness. There is no rebound and no guarding.  Musculoskeletal: Normal range of motion.  Neurological: She is alert. No cranial nerve deficit.  Skin: Skin is warm and dry. No rash noted.  Psychiatric: She has a normal mood and affect.     ED Treatments / Results  Labs (all labs ordered are listed, but only abnormal results are displayed) Labs Reviewed  COMPREHENSIVE METABOLIC PANEL - Abnormal; Notable for the following components:      Result Value   Potassium 3.4 (*)    Glucose, Bld 142 (*)    BUN 25 (*)    Creatinine, Ser 1.48 (*)    Calcium 8.5 (*)    Total Bilirubin 2.7 (*)    GFR calc non Af Amer 31 (*)    GFR calc Af Amer 36 (*)    All other components within normal limits  LIPASE, BLOOD  CBC  URINALYSIS, ROUTINE W REFLEX MICROSCOPIC    EKG None  Radiology No results found.  Procedures Procedures (including critical care time)  Medications Ordered in ED Medications - No data to display   Initial Impression / Assessment and Plan / ED Course  I have reviewed the triage vital signs and the nursing notes.  Pertinent labs & imaging results that were available during my care of the patient were reviewed by me and considered in my medical decision making (see chart for details).     Patient here for evaluation of abdominal cramping pain that started around 3:30 pm this afternoon and I currently resolved. She has one episode nausea with vomiting but none now. No fever. Large volume stool this morning after Dulcolax last night.   She is very well appearing. VSS. Abdominal exam is  benign at this time. No leukocytosis. She does have an elevated total bilirubin of unclear significance.  Remainder of labs and imaging are unremarkable. Patient's pain has not returned in the ED. VSS.   She is seen and examined by Dr. Florina Ou and is felt to be appropriate for discharge home.   Final Clinical Impressions(s) / ED Diagnoses   Final diagnoses:  None   1. Abdominal pain  ED Discharge Orders    None       Charlann Lange, Hershal Coria 06/25/18 0119    M[place]us, Jenny Reichmann, MD 06/25/18 [phone]

## 2018-06-25 NOTE — Discharge Instructions (Addendum)
Return to the emergency department with any worsening symptoms or new concerns.

## 2018-06-27 ENCOUNTER — Ambulatory Visit (HOSPITAL_COMMUNITY)
Admission: RE | Admit: 2018-06-27 | Discharge: 2018-06-27 | Disposition: A | Discharge status: Home / Self Care | Payer: Medicare Other | Source: Ambulatory Visit | Attending: Cardiology | Admitting: Cardiology

## 2018-06-27 ENCOUNTER — Other Ambulatory Visit: Payer: Self-pay | Admitting: Internal Medicine

## 2018-06-27 ENCOUNTER — Encounter (HOSPITAL_COMMUNITY): Payer: Self-pay

## 2018-06-27 ENCOUNTER — Telehealth (HOSPITAL_COMMUNITY): Payer: Self-pay

## 2018-06-27 VITALS — BP 94/70 | HR 70 | Wt 156.8 lb

## 2018-06-27 DIAGNOSIS — I5022 Chronic systolic (congestive) heart failure: Secondary | ICD-10-CM | POA: Insufficient documentation

## 2018-06-27 DIAGNOSIS — N179 Acute kidney failure, unspecified: Secondary | ICD-10-CM | POA: Diagnosis not present

## 2018-06-27 DIAGNOSIS — N184 Chronic kidney disease, stage 4 (severe): Secondary | ICD-10-CM | POA: Insufficient documentation

## 2018-06-27 DIAGNOSIS — G4733 Obstructive sleep apnea (adult) (pediatric): Secondary | ICD-10-CM | POA: Insufficient documentation

## 2018-06-27 DIAGNOSIS — E1122 Type 2 diabetes mellitus with diabetic chronic kidney disease: Secondary | ICD-10-CM | POA: Insufficient documentation

## 2018-06-27 DIAGNOSIS — Z79899 Other long term (current) drug therapy: Secondary | ICD-10-CM | POA: Insufficient documentation

## 2018-06-27 DIAGNOSIS — I480 Paroxysmal atrial fibrillation: Secondary | ICD-10-CM | POA: Insufficient documentation

## 2018-06-27 DIAGNOSIS — Z7901 Long term (current) use of anticoagulants: Secondary | ICD-10-CM | POA: Diagnosis not present

## 2018-06-27 DIAGNOSIS — Z87891 Personal history of nicotine dependence: Secondary | ICD-10-CM | POA: Diagnosis not present

## 2018-06-27 DIAGNOSIS — I442 Atrioventricular block, complete: Secondary | ICD-10-CM

## 2018-06-27 DIAGNOSIS — I428 Other cardiomyopathies: Secondary | ICD-10-CM | POA: Diagnosis not present

## 2018-06-27 DIAGNOSIS — Z8673 Personal history of transient ischemic attack (TIA), and cerebral infarction without residual deficits: Secondary | ICD-10-CM | POA: Insufficient documentation

## 2018-06-27 DIAGNOSIS — R5381 Other malaise: Secondary | ICD-10-CM

## 2018-06-27 MED ORDER — BUMETANIDE 2 MG PO TABS: ORAL_TABLET | ORAL | 3 refills | Status: DC

## 2018-06-27 NOTE — Patient Instructions (Signed)
CHANGE Bumex to 2 mg, one tab daily in the AM and you may take an additional tablet in the PM as needed for weight gain or SOB.   You have been referred to [facility] for physical therapy. They will be in contact with you regarding your first home visit  Your physician recommends that you schedule a follow-up appointment in: 6 weeks with Rebecca Eaton    Do the following things EVERYDAY: 1) Weigh yourself in the morning before breakfast. Write it down and keep it in a log. 2) Take your medicines as prescribed 3) Eat low salt foods-Limit salt (sodium) to 2000 mg per day.  4) Stay as active as you can everyday 5) Limit all fluids for the day to less than 2 liters

## 2018-06-27 NOTE — Addendum Note (Signed)
Encounter addended by: Jorge Ny, LCSW on: 06/27/2018 3:48 PM  Actions taken: Visit Navigator Flowsheet section accepted

## 2018-06-27 NOTE — Progress Notes (Signed)
Advanced Heart Failure Clinic Note   Patient ID: Kim Mullins, female   DOB: [birth year], 84 y.o.   MRN: [id number] PCP: Dr. Ayesha Rumpf Cardiology: Dr. Radford Pax HF Cardiology: Dr. Nickolas Madrid Kim Mullins is a 84 y.o. female with history of chronic systolic CHF/nonischemic cardiomyopathy, prior complete heart block, OSA, and paroxysmal atrial fibrillation presents for CHF clinic evaluation.  She was diagnosed with a cardiomyopathy back in 2015.  She had right and left heart cath in 12/15, showing no significant coronary disease.  EF was in the 30-35% range.  She was admitted in 2/16 with symptomatic bradycardia/complete heart block and had Medtronic CRT-P placed.  In 4/16, EF was back up to 50-55%.  However, repeat echo in 6/17 showed EF down to 15-20% with RV dysfunction.    She was admitted in 8/17 with suspected TIA => dysarthria and blurred vision.  She had been on Eliquis 2.5 mg bid, this was increased to 5 mg bid.  Lisinopril was stopped and Coreg was decreased due to soft BP.  Echo was done that admission, showing improvement in EF to 40-45%.    Echo in 5/19 showed EF 40%, moderate LVH, moderate diastolic dysfunction, mild to moderately decreased RV systolic function, dilated IVC.   She presents today for follow up. Seen in ED 06/24/18 with abdominal pain. Work up [employer]. She is feeling better, though still having intermittent pain. She has been constipated, and thinks it may be related to this. Has been taking mag citrate. She remains SOB with activity. She is SOB walking between rooms. Granddaughter helps with her care and getting to appointments. She has had no further falls. She feels like she is not very strong, and would like to try physical therapy.   Optivol: Did not result.   Labs (3/17): HCT 36.9 Labs (5/17): K 4.2, creatinine 0.98 Labs (7/17): K 4.7, creatinine 1.11, BNP 304 Labs (8/17): K 4, creatinine 0.96, hgb 10.3, LDL 46, SPEP with polyclonal gammopathy.    Labs (9/17): Urine IFE negative, K 4.2, creatinine 1.2, hgb 12 Labs (10/17): K 3.8, creatinine 0.99 Labs (5/19): K 3.7, creatinine 1.21 Labs (03/20/2018): Creatinine 1.97  PMH: 1. Type II diabetes: Diet-controlled.  2. GERD: s/p esophageal dilatation.  3. Chronic systolic CHF: 1st noted in 2015. Nonischemic cardiomyopathy.  - Echo (11/15) with EF 40-45%.  - LHC/RHC (12/15): Normal coronaries, EF 30-35%, mean RA 9, PA 44/16 mean 31, unable to obtain PCWP, CI 2.5.  - Developed CHB and had Medtronic CRT-P placed in 2/16. - Echo (4/16) with EF 50-55%.  - Echo (6/17): EF 15-20%, moderate LVh, restrictive diastolic function, mild MR, RV moderately dilated and moderately decreased in function, moderate TR, PASP 46 mmHg.  - Echo (8/17): EF 40-45%, diffuse hypokinesis, mild MR.  - Echo (5/19): EF 40%, moderate LVH, moderate diastolic dysfunction, mild to moderately decreased RV systolic function, dilated IVC. 4. Complete heart block: Medtronic CRT-P in 2/16.  5. OSA: Uses CPAP 6. Asthma 7. Atrial fibrillation: Paroxysmal.  8. H/o CVA.  Possible TIA in 8/17.    - Carotid dopplers (8/17) with 1-39% BICA stenosis.   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   FH: No cardiac problems that she knows of.   Review of systems complete and found to be negative unless listed in HPI.    Current Outpatient Medications  Medication Sig Dispense Refill  . Acetaminophen (TYLENOL) 325 MG CAPS Take 650  mg by mouth daily as needed (for shoulder pain).     Marland Kitchen apixaban (ELIQUIS) 5 MG TABS tablet Take 1 tablet (5 mg total) by mouth 2 (two) times daily. 60 tablet 6  . Bisacodyl (LAXATIVE PO) Take 1 tablet by mouth daily as needed (for constipation).     . bumetanide (BUMEX) 2 MG tablet Take 1 tablet (2 mg total) by mouth daily.    . carvedilol (COREG) 6.25 MG tablet TAKE 1 TABLET BY MOUTH TWICE DAILY WITH A MEAL (Patient taking differently:  Take 6.25 mg by mouth 2 (two) times daily with a meal. ) 60 tablet 5  . diclofenac sodium (VOLTAREN) 1 % GEL Apply 2 g topically 3 (three) times daily as needed (pain, inflammation). 100 g 0  . glucose blood test strip Use to test blood sugar twice daily (Accuchek Smartview) 100 each 0  . latanoprost (XALATAN) 0.005 % ophthalmic solution Place 1 drop into both eyes at bedtime.   11  . spironolactone (ALDACTONE) 25 MG tablet Take 0.5 tablets (12.5 mg total) by mouth at bedtime. 30 tablet 3  . timolol (TIMOPTIC) 0.5 % ophthalmic solution Place 1 drop into both eyes daily.   11   No current facility-administered medications for this encounter.    Vitals:   06/27/18 1403  BP: 94/70  Pulse: 70  SpO2: 99%  Weight: 71.1 kg (156 lb 12.8 oz)    Wt Readings from Last 3 Encounters:  06/27/18 71.1 kg (156 lb 12.8 oz)  06/24/18 68 kg (150 lb)  06/06/18 68.9 kg (152 lb)    Physical Exam General: Elderly appearing. In wheel chair. No resp difficulty. HEENT: Normal Neck: Supple. JVP 9-10 cm. Carotids 2+ bilat; no bruits. No thyromegaly or nodule noted. Cor: PMI nondisplaced. RRR, No M/G/R noted Lungs: CTAB, normal effort. Abdomen: Soft, non-tender, non-distended, no HSM. No bruits or masses. +BS  Extremities: No cyanosis, clubbing, or rash. R and LLE no edema.  Neuro: Alert & orientedx3, cranial nerves grossly intact. moves all 4 extremities w/o difficulty. Affect pleasant   Assessment/Plan: 1. Chronic systolic CHF: Nonischemic cardiomyopathy, no coronary disease on cath in 12/15. Etiology uncertain => ?viral myocarditis, also consider cardiac amyloidosis based on appearance of LV myocardium with moderate LVH.  Abnormal SPEP => urine immunofixation normal, polyclonal gammopathy, not suggestive of AL amyloidosis.  She does not remember a family history of cardiomyopathy and has never drunk ETOH heavily.  LV function initially improved in 2016, but EF back down to 15-20% with RV dysfunction on 6/17  echo.  Echo 01/26/18: EF 40% with moderate LVH.   - NYHA III-IIIb symptoms. - Volume status elevated on exam - Change bumex to 2 mg daily (was taking 1 mg BID). Should take extra 2 mg tonight.  - Unable to take torsemide due to insurance - Continue Coreg 6.25 mg BID. No room to up-titrate.  - Continue spironolactone 25 mg daily.  - Continue lisinopril 2.5 mg daily. - PYP scan has been ordered but not yet done.  - Myeloma panel was negative in 2017.  - Has CRT device, cannot get MRI.  2. Complete heart block: Medtronic CRT-P - Follows with Dr. Lovena Le. No change.  3. Atrial fibrillation:  - Regular on exam.  - Paroxysmal.  Had suspected TIA in 8/17, suspect that Eliquis was underdosed.  - Continue full dose Eliquis 5mg  BID. Denies bleeding.  4. OSA:  - Continue nightly CPAP.  5. AKI on CKD III-IV - Cr 1.48 on 06/24/18.  6.  Deconditioning - Significant. Will have [facility] evaluate for PT at home.   Doing OK, deconditioning and has some amount of volume overload today. Encouraged extra bumex today and as needed. RTC 6 weeks. Sooner with symptoms.   Shirley Friar, PA-C  06/27/2018   Greater than 50% of the 25 minute visit was spent in counseling/coordination of care regarding disease state education, salt/fluid restriction, sliding scale diuretics, and medication compliance.

## 2018-06-27 NOTE — Telephone Encounter (Signed)
Pt called informed that she needs to take Bumex 2mg   BID for 2 days then take the 2 mg daily, return for nurse check and labs on one week pt has appointment for 07/06/18. Pt verbalized understanding

## 2018-07-06 ENCOUNTER — Ambulatory Visit (HOSPITAL_COMMUNITY)
Admission: RE | Admit: 2018-07-06 | Discharge: 2018-07-06 | Disposition: A | Discharge status: Home / Self Care | Payer: Medicare Other | Source: Ambulatory Visit

## 2018-07-06 ENCOUNTER — Ambulatory Visit (HOSPITAL_COMMUNITY)
Admission: RE | Admit: 2018-07-06 | Discharge: 2018-07-06 | Disposition: A | Discharge status: Home / Self Care | Payer: Medicare Other | Source: Ambulatory Visit | Attending: Cardiology | Admitting: Cardiology

## 2018-07-06 DIAGNOSIS — I5022 Chronic systolic (congestive) heart failure: Secondary | ICD-10-CM

## 2018-07-06 LAB — BASIC METABOLIC PANEL
ANION GAP: 11 (ref 5–15)
BUN: 24 mg/dL — ABNORMAL HIGH (ref 8–23)
CALCIUM: 9 mg/dL (ref 8.9–10.3)
CO2: 27 mmol/L (ref 22–32)
Chloride: 96 mmol/L — ABNORMAL LOW (ref 98–111)
Creatinine, Ser: 1.76 mg/dL — ABNORMAL HIGH (ref 0.44–1.00)
GFR calc Af Amer: 29 mL/min — ABNORMAL LOW (ref >60)
GFR, EST NON AFRICAN AMERICAN: 25 mL/min — AB (ref >60)
GLUCOSE: 123 mg/dL — AB (ref 70–99)
POTASSIUM: 3.6 mmol/L (ref 3.5–5.1)
Sodium: 134 mmol/L — ABNORMAL LOW (ref 135–145)

## 2018-07-10 ENCOUNTER — Telehealth (HOSPITAL_COMMUNITY): Payer: Self-pay | Admitting: Cardiology

## 2018-07-10 DIAGNOSIS — I5022 Chronic systolic (congestive) heart failure: Secondary | ICD-10-CM

## 2018-07-10 NOTE — Telephone Encounter (Signed)
Notes recorded by Kerry Dory, CMA on 07/10/2018 at 4:33 PM EST Message to triage as fyi- labs appt on 07/18/18 ------  Notes recorded by Kerry Dory, CMA on 07/10/2018 at 4:21 PM EST Patient aware. [phone] (H) ------  Notes recorded by Shirley Friar, PA-C on 07/10/2018 at 9:27 AM EST Lasix increased at last visit. By Optivol fluid remains elevated but trending towards normal. Needs repeat BMET and Optivol in 1 week. If not feeling better, needs APP visit in next several weeks.    Legrand Como [address] [place], PA-C 07/10/2018 9:27 AM

## 2018-07-13 ENCOUNTER — Telehealth (HOSPITAL_COMMUNITY): Payer: Self-pay | Admitting: Cardiology

## 2018-07-13 NOTE — Telephone Encounter (Signed)
Princess,PT called during patients home visit to reports 7 lb weight gain since Monday. + LE edema, mild increase in SOB.   Per VO Amy Clegg,NP Ok to increase Bumex 2 mg twice a day for two days then resume normal dose.   Patient aware and voiced understanding, will further evaluate at nursevisit/lab appt 11/19

## 2018-07-18 ENCOUNTER — Other Ambulatory Visit: Payer: Self-pay

## 2018-07-18 ENCOUNTER — Ambulatory Visit (HOSPITAL_COMMUNITY)
Admission: RE | Admit: 2018-07-18 | Discharge: 2018-07-18 | Disposition: A | Discharge status: Home / Self Care | Payer: Medicare Other | Source: Ambulatory Visit | Attending: Cardiology | Admitting: Cardiology

## 2018-07-18 ENCOUNTER — Emergency Department (HOSPITAL_COMMUNITY): Payer: Medicare Other

## 2018-07-18 ENCOUNTER — Encounter (HOSPITAL_COMMUNITY): Payer: Self-pay | Admitting: Emergency Medicine

## 2018-07-18 ENCOUNTER — Inpatient Hospital Stay (HOSPITAL_COMMUNITY)
Admission: EM | Admit: 2018-07-18 | Discharge: 2018-07-26 | DRG: 286 | Disposition: A | Discharge status: Home Health | Payer: Medicare Other | Attending: Family Medicine | Admitting: Family Medicine

## 2018-07-18 DIAGNOSIS — Z90710 Acquired absence of both cervix and uterus: Secondary | ICD-10-CM

## 2018-07-18 DIAGNOSIS — Z888 Allergy status to other drugs, medicaments and biological substances status: Secondary | ICD-10-CM

## 2018-07-18 DIAGNOSIS — I130 Hypertensive heart and chronic kidney disease with heart failure and stage 1 through stage 4 chronic kidney disease, or unspecified chronic kidney disease: Secondary | ICD-10-CM | POA: Diagnosis present

## 2018-07-18 DIAGNOSIS — I442 Atrioventricular block, complete: Secondary | ICD-10-CM | POA: Diagnosis present

## 2018-07-18 DIAGNOSIS — Z87891 Personal history of nicotine dependence: Secondary | ICD-10-CM

## 2018-07-18 DIAGNOSIS — Z833 Family history of diabetes mellitus: Secondary | ICD-10-CM

## 2018-07-18 DIAGNOSIS — E1122 Type 2 diabetes mellitus with diabetic chronic kidney disease: Secondary | ICD-10-CM | POA: Diagnosis present

## 2018-07-18 DIAGNOSIS — I43 Cardiomyopathy in diseases classified elsewhere: Secondary | ICD-10-CM | POA: Diagnosis present

## 2018-07-18 DIAGNOSIS — E854 Organ-limited amyloidosis: Secondary | ICD-10-CM | POA: Diagnosis present

## 2018-07-18 DIAGNOSIS — Z7901 Long term (current) use of anticoagulants: Secondary | ICD-10-CM | POA: Diagnosis not present

## 2018-07-18 DIAGNOSIS — K219 Gastro-esophageal reflux disease without esophagitis: Secondary | ICD-10-CM | POA: Diagnosis present

## 2018-07-18 DIAGNOSIS — N179 Acute kidney failure, unspecified: Secondary | ICD-10-CM | POA: Diagnosis present

## 2018-07-18 DIAGNOSIS — I509 Heart failure, unspecified: Secondary | ICD-10-CM | POA: Diagnosis present

## 2018-07-18 DIAGNOSIS — G4733 Obstructive sleep apnea (adult) (pediatric): Secondary | ICD-10-CM | POA: Diagnosis present

## 2018-07-18 DIAGNOSIS — Z8673 Personal history of transient ischemic attack (TIA), and cerebral infarction without residual deficits: Secondary | ICD-10-CM

## 2018-07-18 DIAGNOSIS — I272 Pulmonary hypertension, unspecified: Secondary | ICD-10-CM | POA: Diagnosis present

## 2018-07-18 DIAGNOSIS — Z91048 Other nonmedicinal substance allergy status: Secondary | ICD-10-CM

## 2018-07-18 DIAGNOSIS — M159 Polyosteoarthritis, unspecified: Secondary | ICD-10-CM | POA: Diagnosis present

## 2018-07-18 DIAGNOSIS — N183 Chronic kidney disease, stage 3 (moderate): Secondary | ICD-10-CM | POA: Diagnosis not present

## 2018-07-18 DIAGNOSIS — Z91012 Allergy to eggs: Secondary | ICD-10-CM

## 2018-07-18 DIAGNOSIS — Z9841 Cataract extraction status, right eye: Secondary | ICD-10-CM | POA: Diagnosis not present

## 2018-07-18 DIAGNOSIS — I480 Paroxysmal atrial fibrillation: Secondary | ICD-10-CM | POA: Diagnosis present

## 2018-07-18 DIAGNOSIS — I071 Rheumatic tricuspid insufficiency: Secondary | ICD-10-CM | POA: Diagnosis present

## 2018-07-18 DIAGNOSIS — Z950 Presence of cardiac pacemaker: Secondary | ICD-10-CM | POA: Diagnosis not present

## 2018-07-18 DIAGNOSIS — Z8249 Family history of ischemic heart disease and other diseases of the circulatory system: Secondary | ICD-10-CM

## 2018-07-18 DIAGNOSIS — I370 Nonrheumatic pulmonary valve stenosis: Secondary | ICD-10-CM | POA: Diagnosis not present

## 2018-07-18 DIAGNOSIS — T502X5A Adverse effect of carbonic-anhydrase inhibitors, benzothiadiazides and other diuretics, initial encounter: Secondary | ICD-10-CM | POA: Diagnosis not present

## 2018-07-18 DIAGNOSIS — I5023 Acute on chronic systolic (congestive) heart failure: Secondary | ICD-10-CM | POA: Diagnosis present

## 2018-07-18 DIAGNOSIS — I340 Nonrheumatic mitral (valve) insufficiency: Secondary | ICD-10-CM | POA: Diagnosis not present

## 2018-07-18 DIAGNOSIS — I5022 Chronic systolic (congestive) heart failure: Secondary | ICD-10-CM

## 2018-07-18 DIAGNOSIS — I420 Dilated cardiomyopathy: Secondary | ICD-10-CM | POA: Diagnosis present

## 2018-07-18 DIAGNOSIS — Z79899 Other long term (current) drug therapy: Secondary | ICD-10-CM | POA: Diagnosis not present

## 2018-07-18 DIAGNOSIS — E876 Hypokalemia: Secondary | ICD-10-CM | POA: Diagnosis not present

## 2018-07-18 DIAGNOSIS — E871 Hypo-osmolality and hyponatremia: Secondary | ICD-10-CM | POA: Diagnosis present

## 2018-07-18 DIAGNOSIS — Z880 Allergy status to penicillin: Secondary | ICD-10-CM

## 2018-07-18 DIAGNOSIS — I4891 Unspecified atrial fibrillation: Secondary | ICD-10-CM | POA: Diagnosis present

## 2018-07-18 DIAGNOSIS — Z87440 Personal history of urinary (tract) infections: Secondary | ICD-10-CM

## 2018-07-18 DIAGNOSIS — Z96611 Presence of right artificial shoulder joint: Secondary | ICD-10-CM | POA: Diagnosis present

## 2018-07-18 DIAGNOSIS — J45909 Unspecified asthma, uncomplicated: Secondary | ICD-10-CM | POA: Diagnosis present

## 2018-07-18 DIAGNOSIS — Z885 Allergy status to narcotic agent status: Secondary | ICD-10-CM

## 2018-07-18 DIAGNOSIS — D649 Anemia, unspecified: Secondary | ICD-10-CM | POA: Diagnosis present

## 2018-07-18 DIAGNOSIS — Z96641 Presence of right artificial hip joint: Secondary | ICD-10-CM | POA: Diagnosis present

## 2018-07-18 DIAGNOSIS — I361 Nonrheumatic tricuspid (valve) insufficiency: Secondary | ICD-10-CM | POA: Diagnosis not present

## 2018-07-18 LAB — GLUCOSE, CAPILLARY: GLUCOSE-CAPILLARY: 148 mg/dL — AB (ref 70–99)

## 2018-07-18 LAB — BASIC METABOLIC PANEL
Anion gap: 11 (ref 5–15)
Anion gap: 12 (ref 5–15)
BUN: 39 mg/dL — ABNORMAL HIGH (ref 8–23)
BUN: 41 mg/dL — ABNORMAL HIGH (ref 8–23)
CALCIUM: 9.1 mg/dL (ref 8.9–10.3)
CO2: 26 mmol/L (ref 22–32)
CO2: 24 mmol/L (ref 22–32)
Calcium: 8.9 mg/dL (ref 8.9–10.3)
Chloride: 98 mmol/L (ref 98–111)
Chloride: 98 mmol/L (ref 98–111)
Creatinine, Ser: 3.67 mg/dL — ABNORMAL HIGH (ref 0.44–1.00)
Creatinine, Ser: 3.71 mg/dL — ABNORMAL HIGH (ref 0.44–1.00)
GFR calc Af Amer: 12 mL/min — ABNORMAL LOW (ref >60)
GFR calc Af Amer: 12 mL/min — ABNORMAL LOW (ref >60)
GFR calc non Af Amer: 10 mL/min — ABNORMAL LOW (ref >60)
GFR, EST NON AFRICAN AMERICAN: 10 mL/min — AB (ref >60)
GLUCOSE: 135 mg/dL — AB (ref 70–99)
Glucose, Bld: 112 mg/dL — ABNORMAL HIGH (ref 70–99)
Potassium: 3.6 mmol/L (ref 3.5–5.1)
Potassium: 4.1 mmol/L (ref 3.5–5.1)
Sodium: 135 mmol/L (ref 135–145)
Sodium: 134 mmol/L — ABNORMAL LOW (ref 135–145)

## 2018-07-18 LAB — I-STAT TROPONIN, ED
Troponin i, poc: 0.3 ng/mL (ref 0.00–0.08)
Troponin i, poc: 0.27 ng/mL (ref 0.00–0.08)

## 2018-07-18 LAB — CBC
HCT: 40.7 % (ref 36.0–46.0)
Hemoglobin: 12.7 g/dL (ref 12.0–15.0)
MCH: 29.5 pg (ref 26.0–34.0)
MCHC: 31.2 g/dL (ref 30.0–36.0)
MCV: 94.4 fL (ref 80.0–100.0)
Platelets: 126 10*3/uL — ABNORMAL LOW (ref 150–400)
RBC: 4.31 MIL/uL (ref 3.87–5.11)
RDW: 15.4 % (ref 11.5–15.5)
WBC: 4.7 10*3/uL (ref 4.0–10.5)
nRBC: 0 % (ref 0.0–0.2)

## 2018-07-18 LAB — BRAIN NATRIURETIC PEPTIDE: B Natriuretic Peptide: 1897.3 pg/mL — ABNORMAL HIGH (ref 0.0–100.0)

## 2018-07-18 LAB — TROPONIN I: Troponin I: 0.25 ng/mL (ref <0.03)

## 2018-07-18 MED ORDER — SODIUM CHLORIDE 0.9% FLUSH: 3.0000 mL | INTRAVENOUS | Status: DC | PRN

## 2018-07-18 MED ORDER — CARVEDILOL 12.5 MG PO TABS
6.2500 mg | ORAL_TABLET | Freq: Two times a day (BID) | ORAL | Status: DC
  Administered 2018-07-19 – 2018-07-22 (×7): 6.25 mg via ORAL
  Filled 2018-07-18 (×7): qty 1

## 2018-07-18 MED ORDER — APIXABAN 2.5 MG PO TABS
2.5000 mg | ORAL_TABLET | Freq: Two times a day (BID) | ORAL | Status: DC
  Administered 2018-07-18 – 2018-07-26 (×14): 2.5 mg via ORAL
  Filled 2018-07-18 (×15): qty 1

## 2018-07-18 MED ORDER — SODIUM CHLORIDE 0.9 % IV SOLN
250.0000 mL | INTRAVENOUS | Status: DC | PRN
  Administered 2018-07-21 – 2018-07-22 (×2): 250 mL via INTRAVENOUS

## 2018-07-18 MED ORDER — LATANOPROST 0.005 % OP SOLN
1.0000 [drp] | Freq: Every day | OPHTHALMIC | Status: DC
  Administered 2018-07-18 – 2018-07-25 (×8): 1 [drp] via OPHTHALMIC
  Filled 2018-07-18: qty 2.5

## 2018-07-18 MED ORDER — TIMOLOL MALEATE 0.5 % OP SOLN
1.0000 [drp] | Freq: Every day | OPHTHALMIC | Status: DC
  Administered 2018-07-18 – 2018-07-26 (×9): 1 [drp] via OPHTHALMIC
  Filled 2018-07-18: qty 5

## 2018-07-18 MED ORDER — INSULIN ASPART 100 UNIT/ML ~~LOC~~ SOLN
0.0000 [IU] | Freq: Three times a day (TID) | SUBCUTANEOUS | Status: DC
  Administered 2018-07-19 – 2018-07-22 (×6): 1 [IU] via SUBCUTANEOUS
  Administered 2018-07-22: 2 [IU] via SUBCUTANEOUS
  Administered 2018-07-23: 1 [IU] via SUBCUTANEOUS
  Administered 2018-07-23: 2 [IU] via SUBCUTANEOUS
  Administered 2018-07-24 – 2018-07-25 (×2): 1 [IU] via SUBCUTANEOUS

## 2018-07-18 MED ORDER — SODIUM CHLORIDE 0.9% FLUSH
3.0000 mL | Freq: Two times a day (BID) | INTRAVENOUS | Status: DC
  Administered 2018-07-18 – 2018-07-25 (×14): 3 mL via INTRAVENOUS

## 2018-07-18 NOTE — ED Provider Notes (Signed)
[facility] [facility] EMERGENCY DEPARTMENT Provider Note   CSN: [id number] Arrival date & time: 07/18/18  1619     History   Chief Complaint Chief Complaint  Patient presents with  . Abnormal Lab    HPI Kim Mullins is a 84 y.o. female with history of anemia, A. fib, chronic systolic CHF, diabetes mellitus, dilated cardiomyopathy, hypertension, OSA, stroke presents for evaluation of acute onset, progressively worsening bilateral lower extremity edema for 1 week.  She was instructed to increase her Bumex 2 mg twice a day for 2 days then resume her normal dose on 07/13/2018.  Today she presented to her cardiologist's office for repeat lab work and was found to have an elevated creatinine and was instructed to present to the ED for further evaluation.  She notes despite increasing Bumex dose she only urinates once a day typically.  She denies dysuria, hematuria, fevers, or chest pain.  She does note dyspnea on exertion.  Denies orthopnea or PND.  Also endorses worsening fatigue/generalized weakness this week.  The history is provided by the patient.    Past Medical History:  Diagnosis Date  . Anemia    occassionally  . Arthritis    all over.  . Asthma    Allergixc reaction to cats.  . Atrial fibrillation ([place])   . Bowel obstruction ([place])   . Chronic systolic CHF (congestive heart failure) (HCC)    EF 30-35% by cath, echo 2016 EF 50%  . Complete heart block (HCC)    a. s/p MDT CRTP pacemaker  . DCM (dilated cardiomyopathy) ([place]) 08/16/2014   normal coronary arteries on cath with EF 30-45%.  EF now 50% by echo 11/2014  . Diabetes mellitus 2006   Diet and exercise controlled.  Marland Kitchen GERD (gastroesophageal reflux disease)    occ  . Hypertension   . Left tibial fracture 2007  . OSA (obstructive sleep apnea) 10/23/2014   Moderate with AHI 21/hr  . Osteoarthritis of right shoulder region 06/26/2013  . Stroke [facility])     Patient Active Problem List   Diagnosis Date  Noted  . CHF (congestive heart failure) ([place]) 07/18/2018  . Gait abnormality 05/03/2017  . Hypotension 04/14/2016  . Hypotension due to drugs   . Chronic anticoagulation   . TIA (transient ischemic attack) 04/12/2016  . UTI (lower urinary tract infection) 04/12/2016  . AKI (acute kidney injury) ([place]) 04/12/2016  . Atrial fibrillation ([place]) 09/12/2015  . Occipital infarction ([place])   . History of stroke 06/18/2015  . Homonymous hemianopsia   . Pacemaker 01/21/2015  . OSA (obstructive sleep apnea) 10/23/2014  . Dizziness 10/21/2014  . Complete heart block ([place]) 10/06/2014  . Benign essential HTN 10/04/2014  . Acute on chronic systolic CHF (congestive heart failure) ([place]) 10/04/2014  . Abnormal cardiovascular stress test 08/27/2014  . Pulmonary HTN ([place]) 08/16/2014  . DCM (dilated cardiomyopathy) ([place]) 08/16/2014  . Cough 07/13/2014  . Esophageal stricture 06/11/2014  . Nonspecific (abnormal) findings on radiological and other examination of gastrointestinal tract 05/29/2014  . Dyspnea 04/29/2014  . Osteoarthritis of right shoulder region 06/26/2013  . DJD (degenerative joint disease) of hip 11/16/2011    Class: Present on Admission    Past Surgical History:  Procedure Laterality Date  . ABDOMINAL HYSTERECTOMY  1969  . BI-VENTRICULAR PACEMAKER INSERTION N/A 10/07/2014   MDT CRTP implanted by Dr Lovena Le  . BRAVO [place] STUDY N/A 06/11/2014   Procedure: BRAVO [place] STUDY;  Surgeon: Inda Castle, MD;  Location: [facility] ENDOSCOPY;  Service: Endoscopy;  Laterality: N/A;  . BUNIONECTOMY  1984   Bilateral  . CARDIAC CATHETERIZATION     normal coronary arteries  . Carpal [place]  2004   Bilateral  . CATARACT EXTRACTION Right    early 2015  . corn removal  1999   Bilateral feet  . DILATION AND CURETTAGE OF UTERUS  1968  . ESOPHAGOGASTRODUODENOSCOPY (EGD) WITH PROPOFOL N/A 06/11/2014   Procedure: ESOPHAGOGASTRODUODENOSCOPY (EGD) WITH PROPOFOL;  Surgeon: Inda Castle, MD;  Location: [facility]  ENDOSCOPY;  Service: Endoscopy;  Laterality: N/A;  . [place]   Surgery to fix Retainal detachment, bilateral  . JOINT REPLACEMENT    . LEFT AND RIGHT HEART CATHETERIZATION WITH CORONARY ANGIOGRAM N/A 08/29/2014   Procedure: LEFT AND RIGHT HEART CATHETERIZATION WITH CORONARY ANGIOGRAM;  Surgeon: Peter M Martinique, MD;  Location: [facility] CATH LAB;  Service: Cardiovascular;  Laterality: N/A;  . MALONEY DILATION  06/11/2014   Procedure: Venia Minks DILATION;  Surgeon: Inda Castle, MD;  Location: [facility] ENDOSCOPY;  Service: Endoscopy;;  . PILONIDAL CYST EXCISION  1959  . TONSILLECTOMY  1942  . TOTAL HIP ARTHROPLASTY  11/16/2011   Procedure: TOTAL HIP ARTHROPLASTY ANTERIOR APPROACH;  Surgeon: Hessie Dibble, MD;  Location: [facility];  Service: Orthopedics;  Laterality: Right;  DEPUY  . TOTAL SHOULDER ARTHROPLASTY Right 06/26/2013   Procedure: TOTAL SHOULDER ARTHROPLASTY;  Surgeon: Johnny Bridge, MD;  Location: [facility];  Service: Orthopedics;  Laterality: Right;     OB History   None      Home Medications    Prior to Admission medications   Medication Sig Start Date End Date Taking? Authorizing Provider  Acetaminophen (TYLENOL) 325 MG CAPS Take 650 mg by mouth daily as needed (for shoulder pain).    Yes [provider]  apixaban (ELIQUIS) 5 MG TABS tablet Take 1 tablet (5 mg total) by mouth 2 (two) times daily. 01/03/18  Yes Larey Dresser, MD  Bisacodyl (LAXATIVE PO) Take 1 tablet by mouth daily as needed (for constipation).    Yes [provider]  bumetanide (BUMEX) 2 MG tablet Take 1 tablet (2 mg total) by mouth every morning. May also take 1 tablet (2 mg total) as needed (edema). 06/27/18  Yes Shirley Friar, PA-C  carvedilol (COREG) 6.25 MG tablet TAKE 1 TABLET BY MOUTH TWICE DAILY WITH A MEAL Patient taking differently: Take 6.25 mg by mouth 2 (two) times daily with a meal.  03/27/18  Yes Larey Dresser, MD  diclofenac sodium (VOLTAREN) 1 % GEL Apply 2 g  topically 3 (three) times daily as needed (pain, inflammation). 04/16/16  Yes Emokpae, Courage, MD  latanoprost (XALATAN) 0.005 % ophthalmic solution Place 1 drop into both eyes at bedtime.  10/20/16  Yes [provider]  spironolactone (ALDACTONE) 25 MG tablet Take 0.5 tablets (12.5 mg total) by mouth at bedtime. 05/26/18  Yes Larey Dresser, MD  timolol (TIMOPTIC) 0.5 % ophthalmic solution Place 1 drop into both eyes daily.  07/29/15  Yes [provider]  glucose blood test strip Use to test blood sugar twice daily ([employer]) 07/19/16   Larey Dresser, MD    Family History Family History  Problem Relation Age of Onset  . Dementia Mother   . Coronary artery disease Mother   . Cancer Father        blood ? type  . Diabetes Maternal Grandfather   . Anuerysm Daughter        brain  .  Colon cancer Neg Hx     Social History Social History   Tobacco Use  . Smoking status: Former Smoker    Packs/day: 1.00    Years: 45.00    Pack years: 45.00    Types: Cigarettes    Last attempt to quit: 07/24/1978    Years since quitting: 40.0  . Smokeless tobacco: Never Used  Substance Use Topics  . Alcohol use: Yes    Alcohol/week: 7.0 standard drinks    Types: 7 Glasses of wine per week    Comment: every day  . Drug use: No     Allergies   Hydrocodone; Percocet [oxycodone-acetaminophen]; Eggs or egg-derived products; Mercurial derivatives; Penicillins; and Quinine derivatives   Review of Systems Review of Systems  Constitutional: Positive for fatigue. Negative for chills and fever.  Respiratory: Positive for shortness of breath.   Cardiovascular: Positive for leg swelling. Negative for chest pain.  Gastrointestinal: Negative for abdominal pain, nausea and vomiting.     Physical Exam Updated Vital Signs BP 99/83   Pulse (!) 126   Temp (!) 97.5 F (36.4 C) (Oral)   Resp 13   SpO2 100%   Physical Exam  Constitutional: She appears well-developed and  well-nourished. No distress.  HENT:  Head: Normocephalic and atraumatic.  Eyes: Conjunctivae are normal. Right eye exhibits no discharge. Left eye exhibits no discharge.  Neck: Normal range of motion. Neck supple. No JVD present. No tracheal deviation present.  Cardiovascular: Normal rate and intact distal pulses.  2+ radial and DP/PT pulses bilaterally.3+ pitting edema of the bilateral lower extremities, no palpable cords, compartments are soft.  Superficial scabs to the anterior aspect of the bilateral lower extremities, healing well.  No surrounding erythema, induration, or abnormal drainage noted.   Pulmonary/Chest: Effort normal. She has rales. She exhibits no tenderness.  Equal rise and fall of chest.  Bibasilar crackles noted.  Abdominal: Soft. Bowel sounds are normal. She exhibits no distension. There is no tenderness.  Musculoskeletal: She exhibits no edema.  Neurological: She is alert.  Skin: Skin is warm and dry. No erythema.  Psychiatric: She has a normal mood and affect. Her behavior is normal.  Nursing note and vitals reviewed.    ED Treatments / Results  Labs (all labs ordered are listed, but only abnormal results are displayed) Labs Reviewed  BASIC METABOLIC PANEL - Abnormal; Notable for the following components:      Result Value   Sodium 134 (*)    Glucose, Bld 112 (*)    BUN 41 (*)    Creatinine, Ser 3.71 (*)    GFR calc non Af Amer 10 (*)    GFR calc Af Amer 12 (*)    All other components within normal limits  CBC - Abnormal; Notable for the following components:   Platelets 126 (*)    All other components within normal limits  BRAIN NATRIURETIC PEPTIDE - Abnormal; Notable for the following components:   B Natriuretic Peptide 1,897.3 (*)    All other components within normal limits  I-STAT TROPONIN, ED - Abnormal; Notable for the following components:   Troponin i, poc 0.30 (*)    All other components within normal limits  I-STAT TROPONIN, ED - Abnormal;  Notable for the following components:   Troponin i, poc 0.27 (*)    All other components within normal limits  TROPONIN I  TROPONIN I  TROPONIN I  BASIC METABOLIC PANEL  CBC    EKG None  Radiology Dg Chest  2 View  Result Date: 07/18/2018 CLINICAL DATA:  Chronic shortness of breath. History of asthma and CHF. EXAM: CHEST - 2 VIEW COMPARISON:  Chest radiograph June 25, 2017 FINDINGS: Stable cardiomegaly. Mildly calcified aortic arch. No pleural effusion or focal consolidation. No pneumothorax. 3 lead LEFT cardiac pacemaker in situ. Severe LEFT glenohumeral osteoarthrosis. RIGHT shoulder arthroplasty. IMPRESSION: Stable cardiomegaly.  No acute pulmonary process. Aortic Atherosclerosis (ICD10-I70.0). Electronically Signed   By: Elon Alas M.D.   On: 07/18/2018 20:14    Procedures Procedures (including critical care time)  Medications Ordered in ED Medications  carvedilol (COREG) tablet 6.25 mg (has no administration in time range)  apixaban (ELIQUIS) tablet 5 mg (has no administration in time range)  latanoprost (XALATAN) 0.005 % ophthalmic solution 1 drop (has no administration in time range)  timolol (TIMOPTIC) 0.5 % ophthalmic solution 1 drop (has no administration in time range)  sodium chloride flush (NS) 0.9 % injection 3 mL (has no administration in time range)  sodium chloride flush (NS) 0.9 % injection 3 mL (has no administration in time range)  0.9 %  sodium chloride infusion (has no administration in time range)  insulin aspart (novoLOG) injection 0-9 Units (has no administration in time range)     Initial Impression / Assessment and Plan / ED Course  I have reviewed the triage vital signs and the nursing notes.  Pertinent labs & imaging results that were available during my care of the patient were reviewed by me and considered in my medical decision making (see chart for details).  Clinical Course as of Jul 19 2223  Tue Jul 18, 2018  1726 EKG is paced at  a rate of 60. AV paced, no change from prior 5/19   [MB]  4692 84 year old female sent in from cardiology after they noted some worsening renal function.  She has had some increased peripheral edema and shortness of breath but denies any chest pain.  Here her creatinine is elevated but also her troponin is elevated.  She is a paced EKG but denies any chest pain.  Review with cardiology what looks to needs to come in for some diuresis.   [MB]    Clinical Course User Index [MB] Hayden Rasmussen, MD    Patient presents sent over from cardiology for evaluation of abnormal lab value.  She has had weight gain, lower extremity edema, dyspnea on exertion for the past week.  She is afebrile vital signs stable while in the ED.  She appears volume overloaded.  Lab work significant for elevated BNP of 1897.3, acutely elevated creatinine of 3.71 and BUN of 41.  Initial troponin elevated, second troponin stable/slightly improved.  Chest x-ray shows stable cardiomegaly, no acute pulmonary process, no evidence of pleural effusion or focal consolidation. EKG shows paced rhythm, no ischemic changes.  He has no chest pain.  7:07 PM Spoke with Dr. Domenic Polite with cardiology.  He feels the elevated troponin secondary to her CHF.  He recommends hospitalist admission for serial troponins with consultation to the heart failure team in the morning.  He recommends holding the patient's spironolactone, Bumex, and ACE but can continue to have her carvedilol.  Spoke with Dr. Shanon Brow with [facility] hospitalist service who agrees to assume care of patient and bring her into the hospital for further evaluation and management.  Final Clinical Impressions(s) / ED Diagnoses   Final diagnoses:  Acute on chronic systolic CHF (congestive heart failure) [facility])    ED Discharge Orders    None  Renita Papa, PA-C 07/18/18 2234    Hayden Rasmussen, MD 07/18/18 [phone]

## 2018-07-18 NOTE — Addendum Note (Signed)
Encounter addended by: Vanice Sarah, CMA on: 07/18/2018 2:55 PM  Actions taken: Allergies reviewed, Medication List reviewed, Medication taking status modified, Order Reconciliation Section accessed, Home Medications modified, Vitals modified

## 2018-07-18 NOTE — H&P (Signed)
History and Physical    Kim Mullins NWG:[id number] DOB: Jan 21, 1930 DOA: 07/18/2018  PCP: Lin Landsman, MD  Patient coming from: Home  Chief Complaint: Abnormal labs  HPI: Kim Mullins is a 84 y.o. female with medical history significant of congestive heart failure, chronic kidney disease who recently had increase in her diuresis by cardiology for an acute CHF exacerbation.  She had follow-up with her cardiologist today noticed a bump in her creatinine and was therefore sent to the emergency department for worsening renal failure in the setting of acute congestive heart failure exacerbation with recent increase in diuretic use.  She denies any fevers.  She denies any shortness of breath.  She denies any chest pain.  Patient is being referred for admission for AKI of creatinine 3.7 with a baseline of 1.5.   Review of Systems: As per HPI otherwise 10 point review of systems negative.   Past Medical History:  Diagnosis Date  . Anemia    occassionally  . Arthritis    all over.  . Asthma    Allergixc reaction to cats.  . Atrial fibrillation ([place])   . Bowel obstruction ([place])   . Chronic systolic CHF (congestive heart failure) (HCC)    EF 30-35% by cath, echo 2016 EF 50%  . Complete heart block (HCC)    a. s/p MDT CRTP pacemaker  . DCM (dilated cardiomyopathy) ([place]) 08/16/2014   normal coronary arteries on cath with EF 30-45%.  EF now 50% by echo 11/2014  . Diabetes mellitus 2006   Diet and exercise controlled.  Marland Kitchen GERD (gastroesophageal reflux disease)    occ  . Hypertension   . Left tibial fracture 2007  . OSA (obstructive sleep apnea) 10/23/2014   Moderate with AHI 21/hr  . Osteoarthritis of right shoulder region 06/26/2013  . Stroke [facility])     Past Surgical History:  Procedure Laterality Date  . ABDOMINAL HYSTERECTOMY  1969  . BI-VENTRICULAR PACEMAKER INSERTION N/A 10/07/2014   MDT CRTP implanted by Dr Lovena Le  . BRAVO [place] STUDY N/A 06/11/2014   Procedure:  BRAVO [place] STUDY;  Surgeon: Inda Castle, MD;  Location: [facility] ENDOSCOPY;  Service: Endoscopy;  Laterality: N/A;  . BUNIONECTOMY  1984   Bilateral  . CARDIAC CATHETERIZATION     normal coronary arteries  . Carpal [place]  2004   Bilateral  . CATARACT EXTRACTION Right    early 2015  . corn removal  1999   Bilateral feet  . DILATION AND CURETTAGE OF UTERUS  1968  . ESOPHAGOGASTRODUODENOSCOPY (EGD) WITH PROPOFOL N/A 06/11/2014   Procedure: ESOPHAGOGASTRODUODENOSCOPY (EGD) WITH PROPOFOL;  Surgeon: Inda Castle, MD;  Location: [facility] ENDOSCOPY;  Service: Endoscopy;  Laterality: N/A;  . [place]   Surgery to fix Retainal detachment, bilateral  . JOINT REPLACEMENT    . LEFT AND RIGHT HEART CATHETERIZATION WITH CORONARY ANGIOGRAM N/A 08/29/2014   Procedure: LEFT AND RIGHT HEART CATHETERIZATION WITH CORONARY ANGIOGRAM;  Surgeon: Peter M Martinique, MD;  Location: [facility] CATH LAB;  Service: Cardiovascular;  Laterality: N/A;  . MALONEY DILATION  06/11/2014   Procedure: Venia Minks DILATION;  Surgeon: Inda Castle, MD;  Location: [facility] ENDOSCOPY;  Service: Endoscopy;;  . PILONIDAL CYST EXCISION  1959  . TONSILLECTOMY  1942  . TOTAL HIP ARTHROPLASTY  11/16/2011   Procedure: TOTAL HIP ARTHROPLASTY ANTERIOR APPROACH;  Surgeon: Hessie Dibble, MD;  Location: [place];  Service: Orthopedics;  Laterality: Right;  DEPUY  . TOTAL SHOULDER ARTHROPLASTY Right 06/26/2013  Procedure: TOTAL SHOULDER ARTHROPLASTY;  Surgeon: Johnny Bridge, MD;  Location: [place];  Service: Orthopedics;  Laterality: Right;     reports that she quit smoking about 40 years ago. Her smoking use included cigarettes. She has a 45.00 pack-year smoking history. She has never used smokeless tobacco. She reports that she drinks about 7.0 standard drinks of alcohol per week. She reports that she does not use drugs.  All pcn  Family History  Problem Relation Age of Onset  . Dementia Mother   . Coronary artery disease Mother   . Cancer Father         blood ? type  . Diabetes Maternal Grandfather   . Anuerysm Daughter        brain  . Colon cancer Neg Hx     Prior to Admission medications   Medication Sig Start Date End Date Taking? Authorizing Provider  Acetaminophen (TYLENOL) 325 MG CAPS Take 650 mg by mouth daily as needed (for shoulder pain).    Yes [provider]  apixaban (ELIQUIS) 5 MG TABS tablet Take 1 tablet (5 mg total) by mouth 2 (two) times daily. 01/03/18  Yes Larey Dresser, MD  Bisacodyl (LAXATIVE PO) Take 1 tablet by mouth daily as needed (for constipation).    Yes [provider]  bumetanide (BUMEX) 2 MG tablet Take 1 tablet (2 mg total) by mouth every morning. May also take 1 tablet (2 mg total) as needed (edema). 06/27/18  Yes Shirley Friar, PA-C  carvedilol (COREG) 6.25 MG tablet TAKE 1 TABLET BY MOUTH TWICE DAILY WITH A MEAL Patient taking differently: Take 6.25 mg by mouth 2 (two) times daily with a meal.  03/27/18  Yes Larey Dresser, MD  diclofenac sodium (VOLTAREN) 1 % GEL Apply 2 g topically 3 (three) times daily as needed (pain, inflammation). 04/16/16  Yes Emokpae, Courage, MD  latanoprost (XALATAN) 0.005 % ophthalmic solution Place 1 drop into both eyes at bedtime.  10/20/16  Yes [provider]  spironolactone (ALDACTONE) 25 MG tablet Take 0.5 tablets (12.5 mg total) by mouth at bedtime. 05/26/18  Yes Larey Dresser, MD  timolol (TIMOPTIC) 0.5 % ophthalmic solution Place 1 drop into both eyes daily.  07/29/15  Yes [provider]  glucose blood test strip Use to test blood sugar twice daily ([employer] Smartview) 07/19/16   Larey Dresser, MD    Physical Exam: Vitals:   07/18/18 1845 07/18/18 1945 07/18/18 2000 07/18/18 2030  BP: 105/75 129/85 106/83 103/74  Pulse: (!) 52 64 76 67  Resp: 18 (!) 22 (!) 23 (!) 21  Temp:      TempSrc:      SpO2: 96% 100% 96% 95%      Constitutional: NAD, calm, comfortable Vitals:   07/18/18 1845 07/18/18 1945  07/18/18 2000 07/18/18 2030  BP: 105/75 129/85 106/83 103/74  Pulse: (!) 52 64 76 67  Resp: 18 (!) 22 (!) 23 (!) 21  Temp:      TempSrc:      SpO2: 96% 100% 96% 95%   Eyes: PERRL, lids and conjunctivae normal ENMT: Mucous membranes are moist. Posterior pharynx clear of any exudate or lesions.Normal dentition.  Neck: normal, supple, no masses, no thyromegaly Respiratory: clear to auscultation bilaterally, no wheezing, no crackles. Normal respiratory effort. No accessory muscle use.  Cardiovascular: Regular rate and rhythm, no murmurs / rubs / gallops. No extremity edema. 2+ pedal pulses. No carotid bruits.  Abdomen: no tenderness, no masses  palpated. No hepatosplenomegaly. Bowel sounds positive.  Musculoskeletal: no clubbing / cyanosis. No joint deformity upper and lower extremities. Good ROM, no contractures. Normal muscle tone.  Skin: no rashes, lesions, ulcers. No induration Neurologic: CN 2-12 grossly intact. Sensation intact, DTR normal. Strength 5/5 in all 4.  Psychiatric: Normal judgment and insight. Alert and oriented x 3. Normal mood.    Labs on Admission: I have personally reviewed following labs and imaging studies  CBC: Recent Labs  Lab 07/18/18 1721  WBC 4.7  HGB 12.7  HCT 40.7  MCV 94.4  PLT 841*   Basic Metabolic Panel: Recent Labs  Lab 07/18/18 1519 07/18/18 1721  NA 135 134*  K 3.6 4.1  CL 98 98  CO2 26 24  GLUCOSE 135* 112*  BUN 39* 41*  CREATININE 3.67* 3.71*  CALCIUM 9.1 8.9   GFR: Estimated Creatinine Clearance: 10.7 mL/min (A) (by C-G formula based on SCr of 3.71 mg/dL (H)). Liver Function Tests: No results for input(s): AST, ALT, ALKPHOS, BILITOT, PROT, ALBUMIN in the last 168 hours. No results for input(s): LIPASE, AMYLASE in the last 168 hours. No results for input(s): AMMONIA in the last 168 hours. Coagulation Profile: No results for input(s): INR, PROTIME in the last 168 hours. Cardiac Enzymes: No results for input(s): CKTOTAL, CKMB,  CKMBINDEX, TROPONINI in the last 168 hours. BNP (last 3 results) No results for input(s): PROBNP in the last 8760 hours. HbA1C: No results for input(s): HGBA1C in the last 72 hours. CBG: No results for input(s): GLUCAP in the last 168 hours. Lipid Profile: No results for input(s): CHOL, HDL, LDLCALC, TRIG, CHOLHDL, LDLDIRECT in the last 72 hours. Thyroid Function Tests: No results for input(s): TSH, T4TOTAL, FREET4, T3FREE, THYROIDAB in the last 72 hours. Anemia Panel: No results for input(s): VITAMINB12, FOLATE, FERRITIN, TIBC, IRON, RETICCTPCT in the last 72 hours. Urine analysis:    Component Value Date/Time   COLORURINE YELLOW 06/24/2018 2314   APPEARANCEUR HAZY (A) 06/24/2018 2314   LABSPEC 1.013 06/24/2018 2314   PHURINE 6.0 06/24/2018 2314   GLUCOSEU NEGATIVE 06/24/2018 2314   GLUCOSEU NEGATIVE 07/10/2014 1517   HGBUR LARGE (A) 06/24/2018 2314   BILIRUBINUR NEGATIVE 06/24/2018 2314   KETONESUR NEGATIVE 06/24/2018 2314   PROTEINUR NEGATIVE 06/24/2018 2314   UROBILINOGEN 0.2 10/06/2014 1552   NITRITE NEGATIVE 06/24/2018 2314   LEUKOCYTESUR MODERATE (A) 06/24/2018 2314   Sepsis Labs: !!!!!!!!!!!!!!!!!!!!!!!!!!!!!!!!!!!!!!!!!!!! @LABRCNTIP (procalcitonin:4,lacticidven:4) )No results found for this or any previous visit (from the past 240 hour(s)).   Radiological Exams on Admission: Dg Chest 2 View  Result Date: 07/18/2018 CLINICAL DATA:  Chronic shortness of breath. History of asthma and CHF. EXAM: CHEST - 2 VIEW COMPARISON:  Chest radiograph June 25, 2017 FINDINGS: Stable cardiomegaly. Mildly calcified aortic arch. No pleural effusion or focal consolidation. No pneumothorax. 3 lead LEFT cardiac pacemaker in situ. Severe LEFT glenohumeral osteoarthrosis. RIGHT shoulder arthroplasty. IMPRESSION: Stable cardiomegaly.  No acute pulmonary process. Aortic Atherosclerosis (ICD10-I70.0). Electronically Signed   By: Elon Alas M.D.   On: 07/18/2018 20:14    EKG:  Independently reviewed.  Paced Old chart reviewed Chest x-ray reviewed no edema or infiltrate Case discussed with Ms. Nils Flack in the ED  Assessment/Plan 84 year old female with recent exacerbation of her chronic systolic congestive heart failure comes in with worsening renal function with a bump to creatinine of 3.7 from 1.5 after recent increase in diuretic usage Principal Problem:   AKI (acute kidney injury) (HCC)-hold spironolactone.  Hold all diuretics at this time.  Monitor  renal function.  With good urinary output.  Likely secondary to recent increase in diuretic usage.  Active Problems:   Acute on chronic systolic CHF (congestive heart failure) (HCC)-as above holding any further diuretic secondary to acute kidney injury.  Cardiology is consulted.  Dr. Domenic Polite called recommending heart failure to see in the morning.  Unclear if this message is being relayed to cardiology team in the morning.    Pulmonary HTN ([place]) -noted stable at this time    Pacemaker-noted    Atrial fibrillation (HCC)-currently rate controlled    Chronic anticoagulation-continue Eliquis     DVT prophylaxis: Eliquis Code Status: Full Family Communication: None Disposition Plan: 1 to 3 days Consults called: Cardiology Admission status: Admission   Sherie Dobrowolski A MD [facility]  If 7PM-7AM, please contact night-coverage [email or web address] Password [email or web address]  07/18/2018, 9:30 PM

## 2018-07-18 NOTE — ED Notes (Signed)
Mina-PA notified of elevated trop of 0.27

## 2018-07-18 NOTE — ED Triage Notes (Addendum)
Pt presents to ED from PCP for elevated creatinine at PCP.  Denies obvious changes in urination, denies pain.  Patient is being treated for weight gain r/t CHF.  Unable to find note in chart from today's visit where she was encouraged to come to the ER

## 2018-07-18 NOTE — Addendum Note (Signed)
Encounter addended by: Vanice Sarah, CMA on: 07/18/2018 3:13 PM  Actions taken: Vitals modified

## 2018-07-18 NOTE — Progress Notes (Signed)
CRITICAL VALUE ALERT  Critical Value:  Troponin 0.25  Date & Time Notied:  07/18/2018; 10:45pm  Provider Notified: Schorr, NP  Orders Received/Actions taken: No new orders, will continue to monitor.

## 2018-07-19 DIAGNOSIS — N179 Acute kidney failure, unspecified: Secondary | ICD-10-CM

## 2018-07-19 DIAGNOSIS — I480 Paroxysmal atrial fibrillation: Secondary | ICD-10-CM

## 2018-07-19 DIAGNOSIS — Z7901 Long term (current) use of anticoagulants: Secondary | ICD-10-CM

## 2018-07-19 DIAGNOSIS — I5023 Acute on chronic systolic (congestive) heart failure: Secondary | ICD-10-CM

## 2018-07-19 DIAGNOSIS — Z950 Presence of cardiac pacemaker: Secondary | ICD-10-CM

## 2018-07-19 DIAGNOSIS — N183 Chronic kidney disease, stage 3 (moderate): Secondary | ICD-10-CM

## 2018-07-19 LAB — BASIC METABOLIC PANEL
ANION GAP: 13 (ref 5–15)
BUN: 41 mg/dL — ABNORMAL HIGH (ref 8–23)
CALCIUM: 8.6 mg/dL — AB (ref 8.9–10.3)
CHLORIDE: 99 mmol/L (ref 98–111)
CO2: 22 mmol/L (ref 22–32)
CREATININE: 3.56 mg/dL — AB (ref 0.44–1.00)
GFR calc non Af Amer: 11 mL/min — ABNORMAL LOW (ref >60)
GFR, EST AFRICAN AMERICAN: 12 mL/min — AB (ref >60)
Glucose, Bld: 100 mg/dL — ABNORMAL HIGH (ref 70–99)
Potassium: 3.1 mmol/L — ABNORMAL LOW (ref 3.5–5.1)
SODIUM: 134 mmol/L — AB (ref 135–145)

## 2018-07-19 LAB — CBC
HCT: 37.9 % (ref 36.0–46.0)
HEMOGLOBIN: 12.1 g/dL (ref 12.0–15.0)
MCH: 29.5 pg (ref 26.0–34.0)
MCHC: 31.9 g/dL (ref 30.0–36.0)
MCV: 92.4 fL (ref 80.0–100.0)
Platelets: 130 10*3/uL — ABNORMAL LOW (ref 150–400)
RBC: 4.1 MIL/uL (ref 3.87–5.11)
RDW: 15.4 % (ref 11.5–15.5)
WBC: 3.8 10*3/uL — AB (ref 4.0–10.5)
nRBC: 0 % (ref 0.0–0.2)

## 2018-07-19 LAB — GLUCOSE, CAPILLARY
GLUCOSE-CAPILLARY: 125 mg/dL — AB (ref 70–99)
Glucose-Capillary: 82 mg/dL (ref 70–99)
Glucose-Capillary: 86 mg/dL (ref 70–99)
Glucose-Capillary: 109 mg/dL — ABNORMAL HIGH (ref 70–99)

## 2018-07-19 LAB — TROPONIN I
TROPONIN I: 0.24 ng/mL — AB (ref <0.03)
Troponin I: 0.25 ng/mL (ref <0.03)

## 2018-07-19 MED ORDER — POTASSIUM CHLORIDE CRYS ER 20 MEQ PO TBCR
40.0000 meq | EXTENDED_RELEASE_TABLET | Freq: Once | ORAL | Status: AC
  Administered 2018-07-19: 40 meq via ORAL
  Filled 2018-07-19: qty 2

## 2018-07-19 MED ORDER — SIMETHICONE 40 MG/0.6ML PO SUSP
40.0000 mg | Freq: Four times a day (QID) | ORAL | Status: DC | PRN
  Administered 2018-07-20: 40 mg via ORAL
  Filled 2018-07-19 (×3): qty 0.6

## 2018-07-19 MED ORDER — FUROSEMIDE 10 MG/ML IJ SOLN
120.0000 mg | Freq: Two times a day (BID) | INTRAVENOUS | Status: DC
  Administered 2018-07-19 – 2018-07-20 (×3): 120 mg via INTRAVENOUS
  Filled 2018-07-19: qty 12
  Filled 2018-07-19: qty 2
  Filled 2018-07-19 (×2): qty 10
  Filled 2018-07-19: qty 12

## 2018-07-19 NOTE — Consult Note (Addendum)
Advanced Heart Failure Team Consult Note   Primary Physician: Lin Landsman, MD PCP-Dr Ayesha Rumpf HF Cardiologist:  Dr Aundra Dubin   Reason for Consultation: Heart Failure   HPI:    Kim Mullins is seen today for evaluation of heart failure  at the request of Dr Renne Crigler.   Kim Mullins is a 84 year old with a history of chronic systolic heart failure, NICM, CHB, OSA, DMII TIA, TTR amyloid, and PAF.   Myocardial Amyloid Imaging --> 06/08/2018 strongly suggestive of TTR amyloidosis. Visual 2 or 3 with ratio >1.5. She has not started tafamidis.   She has been followed closely in the HF clinic was last seen 06/27/2018. At that time she was instructed to take an additional 2 mg of bumex with elevated optivol. On 07/13/18 she called the HF clinic with 7 pound weight gain and she was instructed to take bumex 2 mg twice a day. Weight had gone up from 157--->163 pounds. She had increased leg edema.  She is short of breath with exertion at her baseline.     Yesterday she had repeat BMET that showed worsening renal function so she was instructed to go [facility] ED.Presented to [facility] ED with elevated creatinine. Creatinine had gone up from 1.7> 3.6. BNP was 1897. CXR was negative for acute findings.  Diuretics and spironolactone have been held.   [employer] Interrogation: Optivol consistently over elevated >30 days with low impedance. Going in and out of A fib.   Having some nausea. She denies NSAIDs. SOB with exertion.     Results for Kim Mullins, Kim Mullins And Kim Mullins (MRN [id number]) as of 07/19/2018 11:37  Ref. Range 07/06/2018 13:56 07/18/2018 15:19 07/18/2018 17:21 07/18/2018 17:25 07/18/2018 20:32 07/18/2018 21:40 07/19/2018 03:10  Creatinine Latest Ref Range: 0.44 - 1.00 mg/dL 1.76 (H) 3.67 (H) 3.71 (H)    3.56 (H)    Echo 01/26/3018  - Normal LV size with moderate LV hypertrophy, EF 40% with diffuse   hypokinesis. Moderate diastolic dysfunction with evidence for   elevated LV filling pressure. Normal RV size  with mild-moderately   systolic function. Mild pulmonary hypertension. Dilated IVC   suggesting elevated RV filling pressure.  Review of Systems: [y] = yes, [ ]  = no   General: Weight gain [Y ]; Weight loss [ ] ; Anorexia [ ] ; Fatigue [ ] ; Fever [ ] ; Chills [ ] ; Weakness [Y ]  Cardiac: Chest pain/pressure [ ] ; Resting SOB [ ] ; Exertional SOB [Y ]; Orthopnea [ Y]; Pedal Edema [Y ]; Palpitations [ ] ; Syncope [ ] ; Presyncope [ ] ; Paroxysmal nocturnal dyspnea[ ]   Pulmonary: Cough [ ] ; Wheezing[ ] ; Hemoptysis[ ] ; Sputum [ ] ; Snoring [ ]   GI: Vomiting[ ] ; Dysphagia[ ] ; Melena[ ] ; Hematochezia [ ] ; Heartburn[ ] ; Abdominal pain [ ] ; Constipation [ ] ; Diarrhea [ ] ; BRBPR [ ]   GU: Hematuria[ ] ; Dysuria [ ] ; Nocturia[ ]   Vascular: Pain in legs with walking [ ] ; Pain in feet with lying flat [ ] ; Non-healing sores [ ] ; Stroke [ ] ; TIA [ ] ; Slurred speech [ ] ;  Neuro: Headaches[ ] ; Vertigo[ ] ; Seizures[ ] ; Paresthesias[ ] ;Blurred vision [ ] ; Diplopia [ ] ; Vision changes [ ]   Ortho/Skin: Arthritis [ ] ; Joint pain [ Y]; Muscle pain [ ] ; Joint swelling [ ] ; Back Pain [Y ]; Rash [ ]   Psych: Depression[ ] ; Anxiety[ ]   Heme: Bleeding problems [ ] ; Clotting disorders [ ] ; Anemia [ ]   Endocrine: Diabetes [Y ]; Thyroid dysfunction[ ]   Home Medications Prior to Admission  medications   Medication Sig Start Date End Date Taking? Authorizing Provider  Acetaminophen (TYLENOL) 325 MG CAPS Take 650 mg by mouth daily as needed (for shoulder pain).    Yes [provider]  apixaban (ELIQUIS) 5 MG TABS tablet Take 1 tablet (5 mg total) by mouth 2 (two) times daily. 01/03/18  Yes Larey Dresser, MD  Bisacodyl (LAXATIVE PO) Take 1 tablet by mouth daily as needed (for constipation).    Yes [provider]  bumetanide (BUMEX) 2 MG tablet Take 1 tablet (2 mg total) by mouth every morning. May also take 1 tablet (2 mg total) as needed (edema). 06/27/18  Yes Shirley Friar, PA-C  carvedilol (COREG) 6.25 MG  tablet TAKE 1 TABLET BY MOUTH TWICE DAILY WITH A MEAL Patient taking differently: Take 6.25 mg by mouth 2 (two) times daily with a meal.  03/27/18  Yes Larey Dresser, MD  diclofenac sodium (VOLTAREN) 1 % GEL Apply 2 g topically 3 (three) times daily as needed (pain, inflammation). 04/16/16  Yes Emokpae, Courage, MD  latanoprost (XALATAN) 0.005 % ophthalmic solution Place 1 drop into both eyes at bedtime.  10/20/16  Yes [provider]  spironolactone (ALDACTONE) 25 MG tablet Take 0.5 tablets (12.5 mg total) by mouth at bedtime. 05/26/18  Yes Larey Dresser, MD  timolol (TIMOPTIC) 0.5 % ophthalmic solution Place 1 drop into both eyes daily.  07/29/15  Yes [provider]  glucose blood test strip Use to test blood sugar twice daily ([employer] Smartview) 07/19/16   Larey Dresser, MD    Past Medical History: Past Medical History:  Diagnosis Date  . Anemia    occassionally  . Arthritis    all over.  . Asthma    Allergixc reaction to cats.  . Atrial fibrillation ([place])   . Bowel obstruction ([place])   . Chronic systolic CHF (congestive heart failure) (HCC)    EF 30-35% by cath, echo 2016 EF 50%  . Complete heart block (HCC)    a. s/p MDT CRTP pacemaker  . DCM (dilated cardiomyopathy) ([place]) 08/16/2014   normal coronary arteries on cath with EF 30-45%.  EF now 50% by echo 11/2014  . Diabetes mellitus 2006   Diet and exercise controlled.  Marland Kitchen GERD (gastroesophageal reflux disease)    occ  . Hypertension   . Left tibial fracture 2007  . OSA (obstructive sleep apnea) 10/23/2014   Moderate with AHI 21/hr  . Osteoarthritis of right shoulder region 06/26/2013  . Stroke [facility])     Past Surgical History: Past Surgical History:  Procedure Laterality Date  . ABDOMINAL HYSTERECTOMY  1969  . BI-VENTRICULAR PACEMAKER INSERTION N/A 10/07/2014   MDT CRTP implanted by Dr Lovena Le  . BRAVO [place] STUDY N/A 06/11/2014   Procedure: BRAVO [place] STUDY;  Surgeon: Inda Castle, MD;  Location: [facility]  ENDOSCOPY;  Service: Endoscopy;  Laterality: N/A;  . BUNIONECTOMY  1984   Bilateral  . CARDIAC CATHETERIZATION     normal coronary arteries  . Carpal [place]  2004   Bilateral  . CATARACT EXTRACTION Right    early 2015  . corn removal  1999   Bilateral feet  . DILATION AND CURETTAGE OF UTERUS  1968  . ESOPHAGOGASTRODUODENOSCOPY (EGD) WITH PROPOFOL N/A 06/11/2014   Procedure: ESOPHAGOGASTRODUODENOSCOPY (EGD) WITH PROPOFOL;  Surgeon: Inda Castle, MD;  Location: [facility] ENDOSCOPY;  Service: Endoscopy;  Laterality: N/A;  . [place]   Surgery to fix Retainal detachment, bilateral  .  JOINT REPLACEMENT    . LEFT AND RIGHT HEART CATHETERIZATION WITH CORONARY ANGIOGRAM N/A 08/29/2014   Procedure: LEFT AND RIGHT HEART CATHETERIZATION WITH CORONARY ANGIOGRAM;  Surgeon: Peter M Martinique, MD;  [facility] CATH LAB;  Service: Cardiovascular;  Laterality: N/A;  . MALONEY DILATION  06/11/2014   Procedure: Venia Minks DILATION;  Surgeon: Inda Castle, MD;  Location: [facility] ENDOSCOPY;  Service: Endoscopy;;  . PILONIDAL CYST EXCISION  1959  . TONSILLECTOMY  1942  . TOTAL HIP ARTHROPLASTY  11/16/2011   Procedure: TOTAL HIP ARTHROPLASTY ANTERIOR APPROACH;  Surgeon: Hessie Dibble, MD;  Location: [place];  Service: Orthopedics;  Laterality: Right;  DEPUY  . TOTAL SHOULDER ARTHROPLASTY Right 06/26/2013   Procedure: TOTAL SHOULDER ARTHROPLASTY;  Surgeon: Johnny Bridge, MD;  Location: [place];  Service: Orthopedics;  Laterality: Right;    Family History: Family History  Problem Relation Age of Onset  . Dementia Mother   . Coronary artery disease Mother   . Cancer Father        blood ? type  . Diabetes Maternal Grandfather   . Anuerysm Daughter        brain  . Colon cancer Neg Hx     Social History: Social History   Socioeconomic History  . Marital status: Widowed    Spouse name: Not on file  . Number of children: Not on file  . Years of education: Not on file  . Highest education level:  Doctorate  Occupational History  . Occupation: retired  Scientific laboratory technician  . Financial resource strain: Not hard at all  . Food insecurity:    Worry: Never true    Inability: Never true  . Transportation needs:    Medical: No    Non-medical: No  Tobacco Use  . Smoking status: Former Smoker    Packs/day: 1.00    Years: 45.00    Pack years: 45.00    Types: Cigarettes    Last attempt to quit: 07/24/1978    Years since quitting: 40.0  . Smokeless tobacco: Never Used  Substance and Sexual Activity  . Alcohol use: Yes    Alcohol/week: 7.0 standard drinks    Types: 7 Glasses of wine per week    Comment: every day  . Drug use: No  . Sexual activity: Not Currently  Lifestyle  . Physical activity:    Days per week: Not on file    Minutes per session: Not on file  . Stress: Not on file  Relationships  . Social connections:    Talks on phone: Not on file    Gets together: Not on file    Attends religious service: Not on file    Active member of club or organization: Not on file    Attends meetings of clubs or organizations: Not on file    Relationship status: Not on file  Other Topics Concern  . Not on file  Social History Narrative   Lives alone    Allergies: See list for additional information Hydrocodone Percocet Eggs or Egg derived products Mercurial Derivatives Penicillin Quinine Derivatives    Objective:    Vital Signs:   Temp:  [97.5 F (36.4 C)-98.6 F (37 C)] 98.6 F (37 C) (11/20 8676) Pulse Rate:  [52-126] 61 (11/20 0608) Resp:  [13-25] 20 (11/20 7209) BP: (97-129)/(63-85) 100/76 (11/20 0608) SpO2:  [95 %-100 %] 100 % (11/20 0608) Weight:  [74.1 kg] 74.1 kg (11/19 2229) Last BM Date: 07/18/18  Weight change: [employer]  07/18/18 2229  Weight: 74.1 kg    Intake/Output:  No intake or output data in the 24 hours ending 07/19/18 1003    Physical Exam    General:   No resp difficulty. Sitting in the chair.  HEENT: normal Neck: supple. JVP  to jaw  . Carotids 2+ bilat; no bruits. No lymphadenopathy or thyromegaly appreciated. Cor: PMI nondisplaced. Regular rate & rhythm. 2/6 TR Lungs: clear Abdomen: soft, nontender, ++ distended. No hepatosplenomegaly. No bruits or masses. Good bowel sounds. Extremities: no cyanosis, clubbing, rash, R and LLE 2-3+ edema Neuro: alert & orientedx3, cranial nerves grossly intact. moves all 4 extremities w/o difficulty. Affect pleasant   Telemetry   VPaced   EKG    Sinus rhythm V paced 60 bpm   Labs   Basic Metabolic Panel: Recent Labs  Lab 07/18/18 1519 07/18/18 1721 07/19/18 0310  NA 135 134* 134*  K 3.6 4.1 3.1*  CL 98 98 99  CO2 26 24 22   GLUCOSE 135* 112* 100*  BUN 39* 41* 41*  CREATININE 3.67* 3.71* 3.56*  CALCIUM 9.1 8.9 8.6*    Liver Function Tests: No results for input(s): AST, ALT, ALKPHOS, BILITOT, PROT, ALBUMIN in the last 168 hours. No results for input(s): LIPASE, AMYLASE in the last 168 hours. No results for input(s): AMMONIA in the last 168 hours.  CBC: Recent Labs  Lab 07/18/18 1721 07/19/18 0310  WBC 4.7 3.8*  HGB 12.7 12.1  HCT 40.7 37.9  MCV 94.4 92.4  PLT 126* 130*    Cardiac Enzymes: Recent Labs  Lab 07/18/18 2140 07/19/18 0310 07/19/18 0805  TROPONINI 0.25* 0.24* 0.25*    BNP: BNP (last 3 results) Recent Labs    07/18/18 1721  BNP 1,897.3*    ProBNP (last 3 results) No results for input(s): PROBNP in the last 8760 hours.   CBG: Recent Labs  Lab 07/18/18 2250 07/19/18 0745  GLUCAP 148* 82    Coagulation Studies: No results for input(s): LABPROT, INR in the last 72 hours.   Imaging   Dg Chest 2 View  Result Date: 07/18/2018 CLINICAL DATA:  Chronic shortness of breath. History of asthma and CHF. EXAM: CHEST - 2 VIEW COMPARISON:  Chest radiograph June 25, 2017 FINDINGS: Stable cardiomegaly. Mildly calcified aortic arch. No pleural effusion or focal consolidation. No pneumothorax. 3 lead LEFT cardiac pacemaker in  situ. Severe LEFT glenohumeral osteoarthrosis. RIGHT shoulder arthroplasty. IMPRESSION: Stable cardiomegaly.  No acute pulmonary process. Aortic Atherosclerosis (ICD10-I70.0). Electronically Signed   By: Elon Alas M.D.   On: 07/18/2018 20:14      Medications:     Current Medications: . apixaban  2.5 mg Oral BID  . carvedilol  6.25 mg Oral BID WC  . insulin aspart  0-9 Units Subcutaneous TID WC  . latanoprost  1 drop Both Eyes QHS  . sodium chloride flush  3 mL Intravenous Q12H  . timolol  1 drop Both Eyes Daily     Infusions: . sodium chloride         Patient Profile   Kim Mullins is a 84 year old with a history of chronic systolic heart failure, NICM, CHB, OSA, DMII TIA, and PAF.   Myocardial Amyloid Imaging --> 06/08/2018 strongly suggestive of TTR amyloidosis. Visual 2 or 3 with ratio >1.5. She has not started tafamidis.   Admitted with AKI . Creatinine up 3.6 on admit   Assessment/Plan   1. A/C Systolic Heart Failure, NICM EF 40%. + Cardiac Amyloid TTR Marked  volume overload despite increasing diuretics at home. Diuretic held on admit due to elevated creatinine.  On exam she is clearly volume overloaded. Optivol well above threshold. Start 120 mg IV lasix twice daily. Follow closely for response.  Follow BMET daily. Hold spiro for now.  May need to add intropes to facilitate diuresis.   2. AKI on CKD Stage III Creatinine up from 1.7> 3.6 . Denies NSAID use. Hopefully will improve with decongestion. Concern she may amyloid in her kidneys.   3. Amyloidosis, cardiac PYP score > 1.5  Needs to start tafamidis.   4. CHB, [employer] PPM Needs to be set up in EP clinic. She has not had her device checked in 2 years.   5. DMII Per primary   6. PAF Device interrogation showed intermittent  A fib.  Will need to lower eliquis to 2.5 mg twice a day with age > 80 and creatinine > 1.5.   Medication concerns reviewed with patient and pharmacy team. Barriers  identified: n/a  Length of Stay: [place], NP  07/19/2018, 10:03 AM  Advanced Heart Failure Team Pager [phone] (M-F; 7a - 4p)  Please contact [facility] for night-coverage after hours (4p -7a ) and weekends on [email or web address]  Patient seen and examined with Darrick Grinder, NP. We discussed all aspects of the encounter. I agree with the assessment and plan as stated above.   Delightful 84 y/o woman with HTN, DM2, CKD III-IV and systolic HF (EF 10% in 3/01) followed by Dr Aundra Dubin. Recently found to have TTR amyloid by PYP scan on 10/10 with strongly positive scan (viewed personally).   Now admitted with significant volume overload and A/CKD with creatinine now 3.56. (Baseline about 1.9). Has not been responding to increased po diuretics. Suspect may be related to high renal vein pressures but may also have component of cardio-renal syndrome and/or myeloma kidney.   Will start lasix 120IV bid and assess response. If renal function getting worse may need empiric trial of inotropes and Nephrology consult.   Agree with lowering Eliquis dose. Would repeat echo to see if EF worsening.   Glori Bickers, MD  12:52 PM

## 2018-07-19 NOTE — Progress Notes (Addendum)
PROGRESS NOTE  Kim Mullins GSU:[id number] DOB: [birth year] DOA: 07/18/2018 PCP: Lin Landsman, MD   LOS: 1 day   Brief Narrative / Interim history: 84 year old female with history of chronic systolic CHF, nonischemic cardiomyopathy with myocardial amyloid imaging in October 2019 suggestive of transthyretin amyloidosis supposed to start on tafamidis, who was recently evaluated heart failure at the end of October for fluid overload and her Bumex was increased at that time.  She was admitted to the hospital now with worsening renal failure with her creatinine increasing to 3.7 from a baseline of 1.5-1.7.  Subjective: -She is feeling well, complains of shortness of breath with minimal activities but she is okay at rest.  No chest pain, no palpitations.  No abdominal pain, nausea or vomiting.  Assessment & Plan: Principal Problem:   AKI (acute kidney injury) ([place]) Active Problems:   Pulmonary HTN ([place])   Acute on chronic systolic CHF (congestive heart failure) (HCC)   Pacemaker   Atrial fibrillation (HCC)   Chronic anticoagulation   Acute on chronic systolic CHF in the setting of nonischemic cardiomyopathy with DTR amyloidosis -She has pitting lower extremity edema, JVD and crackles, suggesting fluid overload.  She is poorly responded to outpatient oral diuretics.  Consulted CHF team, appreciate input, will start IV Lasix and follow response -Possibly needing inotropes  Acute kidney injury on chronic kidney disease stage 3-4 -Baseline creatinine 1.5-1.7, significantly elevated at 3.7 on admission.  Slightly improved this morning 3.5, will closely monitor with diuresis  Hyponatremia -In the setting of fluid overload, monitor on Lasix  Paroxysmal A. fib -Device interrogated by cardiology, showing intermittent atrial fibrillation.  Continue Eliquis, lower dose due to renal failure -Has a pacemaker, continue Coreg  Type 2 diabetes mellitus -Controlled, most recent A1c 5.7 but  this was in 2017, keep on sliding scale.  Will repeat an A1c only if her sugars are difficult to control but for now they appears to be quite stable   Scheduled Meds: . apixaban  2.5 mg Oral BID  . carvedilol  6.25 mg Oral BID WC  . insulin aspart  0-9 Units Subcutaneous TID WC  . latanoprost  1 drop Both Eyes QHS  . sodium chloride flush  3 mL Intravenous Q12H  . timolol  1 drop Both Eyes Daily   Continuous Infusions: . sodium chloride    . furosemide     PRN Meds:.sodium chloride, sodium chloride flush   DVT prophylaxis: Eliquis Code Status: Full code Family Communication: Friend at bedside Disposition Plan: Home when ready  Consultants:   Cardiology-CHF  Procedures:   None   Antimicrobials:  None    Objective: Vitals:   07/18/18 2227 07/18/18 2229 07/19/18 0608 07/19/18 1216  BP: 105/76  100/76 97/73  Pulse: 77  61 63  Resp: 13  20 20   Temp:   98.6 F (37 C) (!) 97.5 F (36.4 C)  TempSrc:   Oral Oral  SpO2: 98%  100% 100%  Weight:  74.1 kg    Height:  5\' 3"  (1.6 m)      Intake/Output Summary (Last 24 hours) at 07/19/2018 1251 Last data filed at 07/19/2018 1024 Gross per 24 hour  Intake 390 ml  Output -  Net 390 ml   Filed Weights   07/18/18 2229  Weight: 74.1 kg    Examination:  Constitutional: No apparent distress however breathing a little bit hard Eyes: PERRL, lids and conjunctivae normal ENMT: Mucous membranes are moist. Neck: normal, supple Respiratory: Bibasilar crackles,  no wheezing heard Cardiovascular: Regular rate and rhythm, no significant murmurs appreciated, 2+ pitting edema bilateral lower extremities, +JVD Abdomen: no tenderness. Bowel sounds positive.  Musculoskeletal: no clubbing / cyanosis.  Skin: no rashes appreciated Neurologic: CN 2-12 grossly intact. Strength 5/5 in all 4.  Psychiatric: Normal judgment and insight. Alert and oriented x 3. Normal mood.   Data Reviewed: I have independently reviewed following labs and  imaging studies   CBC: Recent Labs  Lab 07/18/18 1721 07/19/18 0310  WBC 4.7 3.8*  HGB 12.7 12.1  HCT 40.7 37.9  MCV 94.4 92.4  PLT 126* 242*   Basic Metabolic Panel: Recent Labs  Lab 07/18/18 1519 07/18/18 1721 07/19/18 0310  NA 135 134* 134*  K 3.6 4.1 3.1*  CL 98 98 99  CO2 26 24 22   GLUCOSE 135* 112* 100*  BUN 39* 41* 41*  CREATININE 3.67* 3.71* 3.56*  CALCIUM 9.1 8.9 8.6*   GFR: Estimated Creatinine Clearance: 11.1 mL/min (A) (by C-G formula based on SCr of 3.56 mg/dL (H)). Liver Function Tests: No results for input(s): AST, ALT, ALKPHOS, BILITOT, PROT, ALBUMIN in the last 168 hours. No results for input(s): LIPASE, AMYLASE in the last 168 hours. No results for input(s): AMMONIA in the last 168 hours. Coagulation Profile: No results for input(s): INR, PROTIME in the last 168 hours. Cardiac Enzymes: Recent Labs  Lab 07/18/18 2140 07/19/18 0310 07/19/18 0805  TROPONINI 0.25* 0.24* 0.25*   BNP (last 3 results) No results for input(s): PROBNP in the last 8760 hours. HbA1C: No results for input(s): HGBA1C in the last 72 hours. CBG: Recent Labs  Lab 07/18/18 2250 07/19/18 0745 07/19/18 1208  GLUCAP 148* 82 125*   Lipid Profile: No results for input(s): CHOL, HDL, LDLCALC, TRIG, CHOLHDL, LDLDIRECT in the last 72 hours. Thyroid Function Tests: No results for input(s): TSH, T4TOTAL, FREET4, T3FREE, THYROIDAB in the last 72 hours. Anemia Panel: No results for input(s): VITAMINB12, FOLATE, FERRITIN, TIBC, IRON, RETICCTPCT in the last 72 hours. Urine analysis:    Component Value Date/Time   COLORURINE YELLOW 06/24/2018 2314   APPEARANCEUR HAZY (A) 06/24/2018 2314   LABSPEC 1.013 06/24/2018 2314   PHURINE 6.0 06/24/2018 2314   GLUCOSEU NEGATIVE 06/24/2018 2314   GLUCOSEU NEGATIVE 07/10/2014 1517   HGBUR LARGE (A) 06/24/2018 2314   BILIRUBINUR NEGATIVE 06/24/2018 [place] 06/24/2018 2314   PROTEINUR NEGATIVE 06/24/2018 2314    UROBILINOGEN 0.2 10/06/2014 1552   NITRITE NEGATIVE 06/24/2018 2314   LEUKOCYTESUR MODERATE (A) 06/24/2018 2314   Sepsis Labs: Invalid input(s): PROCALCITONIN, LACTICIDVEN  No results found for this or any previous visit (from the past 240 hour(s)).    Radiology Studies: Dg Chest 2 View  Result Date: 07/18/2018 CLINICAL DATA:  Chronic shortness of breath. History of asthma and CHF. EXAM: CHEST - 2 VIEW COMPARISON:  Chest radiograph June 25, 2017 FINDINGS: Stable cardiomegaly. Mildly calcified aortic arch. No pleural effusion or focal consolidation. No pneumothorax. 3 lead LEFT cardiac pacemaker in situ. Severe LEFT glenohumeral osteoarthrosis. RIGHT shoulder arthroplasty. IMPRESSION: Stable cardiomegaly.  No acute pulmonary process. Aortic Atherosclerosis (ICD10-I70.0). Electronically Signed   By: Elon Alas M.D.   On: 07/18/2018 20:14    Marzetta Board, MD, PhD [facility] Pager [phone]  If 7PM-7AM, please contact night-coverage [email or web address] Password [facility] 07/19/2018, 12:51 PM

## 2018-07-20 LAB — BASIC METABOLIC PANEL
Anion gap: 14 (ref 5–15)
BUN: 45 mg/dL — ABNORMAL HIGH (ref 8–23)
CHLORIDE: 99 mmol/L (ref 98–111)
CO2: 20 mmol/L — ABNORMAL LOW (ref 22–32)
CREATININE: 3.65 mg/dL — AB (ref 0.44–1.00)
Calcium: 8.7 mg/dL — ABNORMAL LOW (ref 8.9–10.3)
GFR calc Af Amer: 12 mL/min — ABNORMAL LOW (ref >60)
GFR calc non Af Amer: 10 mL/min — ABNORMAL LOW (ref >60)
Glucose, Bld: 76 mg/dL (ref 70–99)
Potassium: 3.8 mmol/L (ref 3.5–5.1)
SODIUM: 133 mmol/L — AB (ref 135–145)

## 2018-07-20 LAB — GLUCOSE, CAPILLARY
GLUCOSE-CAPILLARY: 75 mg/dL (ref 70–99)
GLUCOSE-CAPILLARY: 99 mg/dL (ref 70–99)
Glucose-Capillary: 141 mg/dL — ABNORMAL HIGH (ref 70–99)
Glucose-Capillary: 138 mg/dL — ABNORMAL HIGH (ref 70–99)

## 2018-07-20 NOTE — Consult Note (Signed)
Reason for Consult: Acute kidney injury on chronic kidney disease stage III, volume overload Referring Physician: Glori Bickers MD (cardiology).  HPI:  84 year old African-American woman with past medical history significant for hypertension, obstructive sleep apnea, history of CVA, osteoarthritis, atrial fibrillation, chronic systolic congestive heart failure secondary to transthyretin amyloidosis not yet on therapy and what appears to be progressive chronic kidney disease recently stage III with baseline creatinine of around 1.7.  Admitted to the hospital after recent up titration of bumetanide resulted in a rise of her creatinine to 3.7.  She denies any recent nausea, vomiting or diarrhea and denies any worsening shortness of breath, pedal edema or chest pain.  She denies any dysuria, urgency, frequency, flank pain, fever or chills.  She denies any recent iodinated intravenous contrast exposure, antibiotics or nonsteroidal anti-inflammatory drug use.  Concern is raised overnight that she has continued worsening of renal function with refractoriness to diuretic therapy-urine output of only 200 cc overnight with furosemide 120 mg IV twice daily.  Past Medical History:  Diagnosis Date  . Anemia    occassionally  . Arthritis    all over.  . Asthma    Allergixc reaction to cats.  . Atrial fibrillation ([place])   . Bowel obstruction ([place])   . Chronic systolic CHF (congestive heart failure) (HCC)    EF 30-35% by cath, echo 2016 EF 50%  . Complete heart block (HCC)    a. s/p MDT CRTP pacemaker  . DCM (dilated cardiomyopathy) ([place]) 08/16/2014   normal coronary arteries on cath with EF 30-45%.  EF now 50% by echo 11/2014  . Diabetes mellitus 2006   Diet and exercise controlled.  Marland Kitchen GERD (gastroesophageal reflux disease)    occ  . Hypertension   . Left tibial fracture 2007  . OSA (obstructive sleep apnea) 10/23/2014   Moderate with AHI 21/hr  . Osteoarthritis of right shoulder region 06/26/2013   . Stroke [facility])     Past Surgical History:  Procedure Laterality Date  . ABDOMINAL HYSTERECTOMY  1969  . BI-VENTRICULAR PACEMAKER INSERTION N/A 10/07/2014   MDT CRTP implanted by Dr Lovena Le  . BRAVO [place] STUDY N/A 06/11/2014   Procedure: BRAVO [place] STUDY;  Surgeon: Inda Castle, MD;  Location: [facility] ENDOSCOPY;  Service: Endoscopy;  Laterality: N/A;  . BUNIONECTOMY  1984   Bilateral  . CARDIAC CATHETERIZATION     normal coronary arteries  . Carpal [place]  2004   Bilateral  . CATARACT EXTRACTION Right    early 2015  . corn removal  1999   Bilateral feet  . DILATION AND CURETTAGE OF UTERUS  1968  . ESOPHAGOGASTRODUODENOSCOPY (EGD) WITH PROPOFOL N/A 06/11/2014   Procedure: ESOPHAGOGASTRODUODENOSCOPY (EGD) WITH PROPOFOL;  Surgeon: Inda Castle, MD;  Location: [facility] ENDOSCOPY;  Service: Endoscopy;  Laterality: N/A;  . [place]   Surgery to fix Retainal detachment, bilateral  . JOINT REPLACEMENT    . LEFT AND RIGHT HEART CATHETERIZATION WITH CORONARY ANGIOGRAM N/A 08/29/2014   Procedure: LEFT AND RIGHT HEART CATHETERIZATION WITH CORONARY ANGIOGRAM;  Surgeon: Peter M Martinique, MD;  Location: [facility] CATH LAB;  Service: Cardiovascular;  Laterality: N/A;  . [place] DILATION  06/11/2014   Procedure: Venia Minks DILATION;  Surgeon: Inda Castle, MD;  Location: [facility] ENDOSCOPY;  Service: Endoscopy;;  . PILONIDAL CYST EXCISION  1959  . TONSILLECTOMY  1942  . TOTAL HIP ARTHROPLASTY  11/16/2011   Procedure: TOTAL HIP ARTHROPLASTY ANTERIOR APPROACH;  Surgeon: Hessie Dibble, MD;  Location: [facility]  OR;  Service: Orthopedics;  Laterality: Right;  DEPUY  . TOTAL SHOULDER ARTHROPLASTY Right 06/26/2013   Procedure: TOTAL SHOULDER ARTHROPLASTY;  Surgeon: Johnny Bridge, MD;  Location: [place];  Service: Orthopedics;  Laterality: Right;    Family History  Problem Relation Age of Onset  . Dementia Mother   . Coronary artery disease Mother   . Cancer Father        blood ? type  . Diabetes Maternal Grandfather    . Anuerysm Daughter        brain  . Colon cancer Neg Hx     Social History:  reports that she quit smoking about 40 years ago. Her smoking use included cigarettes. She has a 45.00 pack-year smoking history. She has never used smokeless tobacco. She reports that she drinks about 7.0 standard drinks of alcohol per week. She reports that she does not use drugs.  Allergies: Reviewed   Medications:  Scheduled: . apixaban  2.5 mg Oral BID  . carvedilol  6.25 mg Oral BID WC  . insulin aspart  0-9 Units Subcutaneous TID WC  . latanoprost  1 drop Both Eyes QHS  . sodium chloride flush  3 mL Intravenous Q12H  . timolol  1 drop Both Eyes Daily    BMP Latest Ref Rng & Units 07/20/2018 07/19/2018 07/18/2018  Glucose 70 - 99 mg/dL 76 100(H) 112(H)  BUN 8 - 23 mg/dL 45(H) 41(H) 41(H)  Creatinine 0.44 - 1.00 mg/dL 3.65(H) 3.56(H) 3.71(H)  Sodium 135 - 145 mmol/L 133(L) 134(L) 134(L)  Potassium 3.5 - 5.1 mmol/L 3.8 3.1(L) 4.1  Chloride 98 - 111 mmol/L 99 99 98  CO2 22 - 32 mmol/L 20(L) 22 24  Calcium 8.9 - 10.3 mg/dL 8.7(L) 8.6(L) 8.9   CBC Latest Ref Rng & Units 07/19/2018 07/18/2018 06/24/2018  WBC 4.0 - 10.5 K/uL 3.8(L) 4.7 5.7  Hemoglobin 12.0 - 15.0 g/dL 12.1 12.7 12.3  Hematocrit 36.0 - 46.0 % 37.9 40.7 41.0  Platelets 150 - 400 K/uL 130(L) 126(L) 163     Dg Chest 2 View  Result Date: 07/18/2018 CLINICAL DATA:  Chronic shortness of breath. History of asthma and CHF. EXAM: CHEST - 2 VIEW COMPARISON:  Chest radiograph June 25, 2017 FINDINGS: Stable cardiomegaly. Mildly calcified aortic arch. No pleural effusion or focal consolidation. No pneumothorax. 3 lead LEFT cardiac pacemaker in situ. Severe LEFT glenohumeral osteoarthrosis. RIGHT shoulder arthroplasty. IMPRESSION: Stable cardiomegaly.  No acute pulmonary process. Aortic Atherosclerosis (ICD10-I70.0). Electronically Signed   By: Elon Alas M.D.   On: 07/18/2018 20:14    Review of Systems  Constitutional: Negative.    HENT: Negative.   Eyes: Negative.   Respiratory: Negative.   Cardiovascular: Negative.   Gastrointestinal: Negative.   Genitourinary: Negative.   Musculoskeletal: Negative.   Skin: Negative.   Neurological: Positive for weakness.       Complains of bilateral leg weakness   Blood pressure 111/82, pulse 72, temperature (!) 97.3 F (36.3 C), temperature source Oral, resp. rate 20, height 5\' 3"  (1.6 m), weight 74.8 kg, SpO2 100 %. Physical Exam  Nursing note and vitals reviewed. Constitutional: She is oriented to person, place, and time. She appears well-developed and well-nourished. No distress.  HENT:  Head: Normocephalic and atraumatic.  Mouth/Throat: Oropharynx is clear and moist. No oropharyngeal exudate.  Eyes: Pupils are equal, round, and reactive to light. Conjunctivae and EOM are normal. No scleral icterus.  Neck: Normal range of motion. Neck supple. JVD present.  JVP to angle  of jaw  Cardiovascular: Normal rate and regular rhythm.  No murmur heard. Respiratory: Effort normal. She has no wheezes. She has rales.  Fine rales left base  GI: Soft. Bowel sounds are normal. There is no tenderness. There is no rebound.  Musculoskeletal: She exhibits edema.  2+ bilateral pitting pretibial edema  Neurological: She is alert and oriented to person, place, and time.  Skin: Skin is warm and dry. No rash noted.  Psychiatric: She has a normal mood and affect. Thought content normal.    Assessment/Plan: 1.  Acute kidney injury on chronic kidney disease stage III: Based on the history, timeline of events and available database, this appears to be likely from chronic cardiorenal syndrome and at this time, I question as to the possibility of whether she indeed has had significant reduction of intravascular volume while maintaining interstitial edema/other signs of volume overload along with renal vascular congestion.  I would recommend transient interruption of diuretic therapy for the next 18  hours or so to allow for re-equilibration of interstitial to intravascular fluid and thereafter considering inotropic support with diuretic therapy.  If she remains refractory to that, she appears to be quite functional at baseline and may benefit from extracorporeal fluid removal with dialysis.  It is unclear whether this would be long-term dialysis but highly likely to be so. 2.  Hyponatremia: Secondary to worsening renal function/impaired water handling in the setting of CHF decompensation.  Restrict oral fluid intake to 1.2 L/day and reattempt loop diuretics after holding for the next 18 hours. 3.  Paroxysmal atrial fibrillation: Currently rate controlled and in sinus rhythm, on Eliquis. 4.  Acute exacerbation of systolic heart failure: With prior history of cardiac amyloidosis yet to begin treatment.  Agree with holding spironolactone at this time and transiently holding loop diuretics with trial of inotropic support/diuretics in the next 24 hours. 5.  Hypertension: Blood pressures currently appear to be fairly controlled.  Chaney Ingram K. 07/20/2018, 11:45 AM

## 2018-07-20 NOTE — Progress Notes (Addendum)
Advanced Heart Failure Rounding Note  PCP-Cardiologist: No primary care provider on file.   Subjective:    Feeling slightly better this am. Denies SOB at rest, but remains with any activity. Very fatigued. Poor UOP. Says she only voided "a couple of times".  Cr 3.56 -> 3.65 with high dose lasix. Poor UOP (or not recorded). Weight up 1.5 lbs.   Objective:   Weight Range: 74.8 kg Body mass index is 29.21 kg/m.   Vital Signs:   Temp:  [97.3 F (36.3 C)-97.5 F (36.4 C)] 97.3 F (36.3 C) (11/21 0739) Pulse Rate:  [59-72] 72 (11/21 0739) Resp:  [20] 20 (11/21 0554) BP: (95-111)/(73-82) 111/82 (11/21 0739) SpO2:  [98 %-100 %] 100 % (11/21 0739) Weight:  [74.8 kg] 74.8 kg (11/21 0559) Last BM Date: 07/18/18  Weight change: Filed Weights   07/18/18 2229 07/20/18 0559  Weight: 74.1 kg 74.8 kg    Intake/Output:   Intake/Output Summary (Last 24 hours) at 07/20/2018 0826 Last data filed at 07/20/2018 0307 Gross per 24 hour  Intake 519.13 ml  Output 200 ml  Net 319.13 ml    Physical Exam    General:  NAD HEENT: Normal Neck: Supple. JVP to jaw. Carotids 2+ bilat; no bruits. No lymphadenopathy or thyromegaly appreciated. Cor: PMI nondisplaced. Regular rate & rhythm. 2/6 TR Lungs: Clear Abdomen: Soft, nontender, ++ distended. No hepatosplenomegaly. No bruits or masses. Good bowel sounds. Extremities: No cyanosis, clubbing, or rash. 2-3+ BLE edema.  Neuro: Alert & orientedx3, cranial nerves grossly intact. moves all 4 extremities w/o difficulty. Affect pleasant  Telemetry   V paced 60-70s, personally reviewed.   EKG    No new tracings.    Labs    CBC Recent Labs    07/18/18 1721 07/19/18 0310  WBC 4.7 3.8*  HGB 12.7 12.1  HCT 40.7 37.9  MCV 94.4 92.4  PLT 126* 973*   Basic Metabolic Panel Recent Labs    07/19/18 0310 07/20/18 0459  NA 134* 133*  K 3.1* 3.8  CL 99 99  CO2 22 20*  GLUCOSE 100* 76  BUN 41* 45*  CREATININE 3.56* 3.65*  CALCIUM  8.6* 8.7*   Liver Function Tests No results for input(s): AST, ALT, ALKPHOS, BILITOT, PROT, ALBUMIN in the last 72 hours. No results for input(s): LIPASE, AMYLASE in the last 72 hours. Cardiac Enzymes Recent Labs    07/18/18 2140 07/19/18 0310 07/19/18 0805  TROPONINI 0.25* 0.24* 0.25*    BNP: BNP (last 3 results) Recent Labs    07/18/18 1721  BNP 1,897.3*    ProBNP (last 3 results) No results for input(s): PROBNP in the last 8760 hours.   D-Dimer No results for input(s): DDIMER in the last 72 hours. Hemoglobin A1C No results for input(s): HGBA1C in the last 72 hours. Fasting Lipid Panel No results for input(s): CHOL, HDL, LDLCALC, TRIG, CHOLHDL, LDLDIRECT in the last 72 hours. Thyroid Function Tests No results for input(s): TSH, T4TOTAL, T3FREE, THYROIDAB in the last 72 hours.  Invalid input(s): FREET3  Other results:   Imaging     No results found.   Medications:     Scheduled Medications: . apixaban  2.5 mg Oral BID  . carvedilol  6.25 mg Oral BID WC  . insulin aspart  0-9 Units Subcutaneous TID WC  . latanoprost  1 drop Both Eyes QHS  . sodium chloride flush  3 mL Intravenous Q12H  . timolol  1 drop Both Eyes Daily  Infusions: . sodium chloride    . furosemide 120 mg (07/19/18 2305)     PRN Medications:  sodium chloride, simethicone, sodium chloride flush    Patient Profile   Kim Mullins is a 84 year old with a history of chronic systolic heart failure, NICM, CHB, OSA, DMII TIA, and PAF.   Myocardial Amyloid Imaging --> 06/08/2018 strongly suggestive of TTR amyloidosis. Visual 2 or 3 with ratio >1.5. She has not started tafamidis.   Admitted with AKI . Creatinine up 3.6 on admit   Assessment/Plan   1. A/C Systolic Heart Failure, NICM EF 40% 5/19. + Cardiac Amyloid TTR.  Medtronic CRT-P.  - She remains marked volume overloaded despite high dose IV lasix.  - Continue 120 mg IV lasix BID for now. Will likely need renal  involvement.  - With poor urine output will discuss trial of empiric inotrope vs renal involvement with Dr. Aundra Dubin.  - Holding spiro with ARF.  - May need inotrope to facilitate diuresis.  - If no improvement, low threshold to involve palliative services. Not ideal candidate for IHD.   2. AKI on CKD Stage III - Creatinine up from 1.7> 3.6.  - No improvement overnight with high dose lasix.  - Denies NSAID use. Concern for amyloid in her kidneys.   3. Amyloidosis, cardiac - PYP score > 1.5  - Needs to start tafamidis depending on goals of care.   4. CHB, Medtronic PPM - Needs EP follow up. Last seen by EP in 2017.   5. DMII - Per primary.   6. PAF - Device interrogation showed intermittent  A fib.  - Continue eliquis at lower dose with age > 80 and Cr > 1.5.   Medication concerns reviewed with patient and pharmacy team. Barriers identified: None at this time.   Length of Stay: 2  Kim Mullins  07/20/2018, 8:26 AM  Advanced Heart Failure Team Pager [phone] (M-F; 7a - 4p)  Please contact [facility] for night-coverage after hours (4p -7a ) and weekends on [email or web address]  Patient seen with PA, agree with the above note.   She remains markedly volume overloaded with poor response to high dose IV Lasix.  Creatinine remains elevated, 3.65 today. Suspect cardiorenal syndrome.    Device interrogation has shown minimal atrial fibrillation and 99% BiV pacing.   Renal note reviewed, recommend 18 hr diuretic holiday then resume diuretic with IV inotrope.  Therefore, will hold afternoon dose of IV Lasix.  Tomorrow, we will start her on milrinone 0.25 (as long as renal function is not worse, in that case may use dobutamine).  Will follow with Lasix infusion.   Will get echo.   If renal function continues to worsen, trial of dialysis would be reasonable.   [name] 07/20/2018 2:51 PM

## 2018-07-20 NOTE — Evaluation (Signed)
Physical Therapy Evaluation Patient Details Name: Kim Mullins MRN: [id number] DOB: 09-18-1929 Today's Date: 07/20/2018   History of Present Illness  Pt is an 84 y.o. female admitted 07/18/18 from follow-up with cardiologist who noticed increased creatinine; worked up for worsening renal failure in setting of acute CHF exacerbation and recent increase in diuretic use. PMH includes HF, CKD, CVA, HTN, a-fib, OSA, OA.    Clinical Impression  Pt presents with an overall decrease in functional mobility secondary to above. PTA, pt mod indep with SPC or rollator; lives alone with multiple friends nearby to assist as needed; pt remains very active in community, yoga, and drives. Today, pt ambulatory with SPC and close min guard for balance; ambulation limited by fatigue and DOE 3/4 (SpO2 99-100% ON RA). Pt with intermittent instability; recommend use of RW or rollator to assist with fall risk and energy conservation. Pt and granddaughter currently discussing the fact that pt may not be safe to live alone due to fall risk; discussed keeping cell phone on her in case of falls. Pt would benefit from continued acute PT services to maximize functional mobility and independence prior to d/c with HHPT services.    Follow Up Recommendations Home health PT;Supervision - Intermittent    Equipment Recommendations  None recommended by PT    Recommendations for Other Services       Precautions / Restrictions Precautions Precautions: Fall Restrictions Weight Bearing Restrictions: No      Mobility  Bed Mobility Overal bed mobility: Independent                Transfers Overall transfer level: Needs assistance Equipment used: Straight cane Transfers: Sit to/from Stand Sit to Stand: Supervision            Ambulation/Gait Ambulation/Gait assistance: Min guard Gait Distance (Feet): 80 Feet Assistive device: Straight cane Gait Pattern/deviations: Step-through pattern;Decreased  stride length;Trunk flexed Gait velocity: Decreased Gait velocity interpretation: <1.8 ft/sec, indicate of risk for recurrent falls General Gait Details: Slow, mildly unsteady amb with SPC in RUE and close min guard for balance. Pt taking intermittent standing rest breaks secondary to fatigue and DOE 3/4. 2x self-corrected posterior LOB  Stairs            Wheelchair Mobility    Modified Rankin (Stroke Patients Only)       Balance Overall balance assessment: Needs assistance   Sitting balance-Leahy Scale: Good       Standing balance-Leahy Scale: Fair                               Pertinent Vitals/Pain Pain Assessment: No/denies pain    Home Living Family/patient expects to be discharged to:: Private residence Living Arrangements: Alone Available Help at Discharge: Friend(s);Available PRN/intermittently Type of Home: House Home Access: Level entry     Home Layout: One level Home Equipment: Walker - 2 wheels;Walker - 4 wheels;Cane - single point;Bedside commode;Shower seat;Toilet riser      Prior Function Level of Independence: Independent with assistive device(s)         Comments: Uses RW, rollator, or SPC depending on the day; recently using rollator more, but prefers cane. Has had falls recently (R knee giving out, tripping on rollator, etc.)     Hand Dominance        Extremity/Trunk Assessment   Upper Extremity Assessment Upper Extremity Assessment: Overall WFL for tasks assessed    Lower Extremity Assessment Lower Extremity Assessment:  Generalized weakness    Cervical / Trunk Assessment Cervical / Trunk Assessment: Kyphotic  Communication   Communication: HOH  Cognition Arousal/Alertness: Awake/alert Behavior During Therapy: WFL for tasks assessed/performed Overall Cognitive Status: Within Functional Limits for tasks assessed                                        General Comments General comments (skin  integrity, edema, etc.): SpO2 99-100% on RA     Exercises     Assessment/Plan    PT Assessment Patient needs continued PT services  PT Problem List Decreased strength;Decreased activity tolerance;Decreased balance;Decreased mobility;Decreased knowledge of use of DME       PT Treatment Interventions DME instruction;Gait training;Stair training;Functional mobility training;Therapeutic activities;Therapeutic exercise;Balance training;Patient/family education    PT Goals (Current goals can be found in the Care Plan section)  Acute Rehab PT Goals Patient Stated Goal: "Return home when I can" PT Goal Formulation: With patient Time For Goal Achievement: 08/03/18 Potential to Achieve Goals: Good    Frequency Min 3X/week   Barriers to discharge Decreased caregiver support      Co-evaluation               AM-PAC PT "6 Clicks" Daily Activity  Outcome Measure Difficulty turning over in bed (including adjusting bedclothes, sheets and blankets)?: None Difficulty moving from lying on back to sitting on the side of the bed? : None Difficulty sitting down on and standing up from a chair with arms (e.g., wheelchair, bedside commode, etc,.)?: A Little Help needed moving to and from a bed to chair (including a wheelchair)?: A Little Help needed walking in hospital room?: A Little Help needed climbing 3-5 steps with a railing? : A Little 6 Click Score: 20    End of Session Equipment Utilized During Treatment: Gait belt Activity Tolerance: Patient tolerated treatment well Patient left: in bed;with call bell/phone within reach;with family/visitor present;Other (comment)(with visitor)   PT Visit Diagnosis: Other abnormalities of gait and mobility (R26.89);Repeated falls (R29.6)    Time: 7893-8101 PT Time Calculation (min) (ACUTE ONLY): 30 min   Charges:   PT Evaluation $PT Eval Moderate Complexity: 1 Mod PT Treatments $Gait Training: 8-22 mins       Mabeline Caras, PT,  DPT Acute Rehabilitation Services  Pager [phone] Office [place] 07/20/2018, 4:25 PM

## 2018-07-20 NOTE — Progress Notes (Signed)
PROGRESS NOTE  Kim Mullins FIE:[id number] DOB: 10-10-1929 DOA: 07/18/2018 PCP: Lin Landsman, MD   LOS: 2 days   Brief Narrative / Interim history: 84 year old female with history of chronic systolic CHF, nonischemic cardiomyopathy with myocardial amyloid imaging in October 2019 suggestive of transthyretin amyloidosis supposed to start on tafamidis, who was recently evaluated heart failure at the end of October for fluid overload and her Bumex was increased at that time.  She was admitted to the hospital now with worsening renal failure with her creatinine increasing to 3.7 from a baseline of 1.5-1.7.  Subjective: -Denies any chest pain, denies any shortness of breath at rest.  States that she has not urinated much overnight.  Assessment & Plan: Principal Problem:   AKI (acute kidney injury) ([place]) Active Problems:   Pulmonary HTN ([place])   Acute on chronic systolic CHF (congestive heart failure) (HCC)   Pacemaker   Atrial fibrillation (HCC)   Chronic anticoagulation   Acute on chronic systolic CHF in the setting of nonischemic cardiomyopathy with DTR amyloidosis -She has pitting lower extremity edema, JVD and crackles, suggesting fluid overload.  She is poorly responded to outpatient oral diuretics.  Consulted CHF team, appreciate input -Started on IV Lasix yesterday 11/20, poor output only 200 cc, weight is gone up slightly from 163-164.9, continues to have significant fluid overload -Creatinine slightly worse as well -Further management per heart failure, considering inotropes  Acute kidney injury on chronic kidney disease stage 3-4 -Baseline creatinine 1.5-1.7, significantly elevated at 3.7 on admission and not much improved with Lasix.  May need inotropes.  Hyponatremia -In the setting of fluid overload, sodium slightly worse but stable at 133 this morning  Paroxysmal A. fib -Device interrogated by cardiology, showing intermittent atrial fibrillation.  Continue  Eliquis, lower dose due to renal failure -Has a pacemaker, continue Coreg  Type 2 diabetes mellitus -Controlled, most recent A1c 5.7 but this was in 2017, keep on sliding scale.  Will repeat an A1c only if her sugars are difficult to control but for now they appears to be quite stable   Scheduled Meds: . apixaban  2.5 mg Oral BID  . carvedilol  6.25 mg Oral BID WC  . insulin aspart  0-9 Units Subcutaneous TID WC  . latanoprost  1 drop Both Eyes QHS  . sodium chloride flush  3 mL Intravenous Q12H  . timolol  1 drop Both Eyes Daily   Continuous Infusions: . sodium chloride    . furosemide 120 mg (07/19/18 2305)   PRN Meds:.sodium chloride, simethicone, sodium chloride flush   DVT prophylaxis: Eliquis Code Status: Full code Family Communication: No family at bedside Disposition Plan: Home when ready  Consultants:   Cardiology-CHF  Procedures:   None   Antimicrobials:  None    Objective: Vitals:   07/19/18 2125 07/20/18 0554 07/20/18 0559 07/20/18 0739  BP: 95/74 109/78  111/82  Pulse: (!) 59 60  72  Resp: 20 20    Temp: (!) 97.5 F (36.4 C) (!) 97.4 F (36.3 C)  (!) 97.3 F (36.3 C)  TempSrc: Oral Oral  Oral  SpO2: 99% 98%  100%  Weight:   74.8 kg   Height:        Intake/Output Summary (Last 24 hours) at 07/20/2018 1127 Last data filed at 07/20/2018 1000 Gross per 24 hour  Intake 509.13 ml  Output 200 ml  Net 309.13 ml   Filed Weights   07/18/18 2229 07/20/18 0559  Weight: 74.1 kg 74.8 kg  Examination:  Constitutional: NAD Eyes: No scleral icterus ENMT: Moist mixed membranes Neck: normal, supple Respiratory: Bibasilar crackles, no wheezing, good air movement Cardiovascular: Regular, 2+ pitting edema,+ JVD Abdomen: Soft, nontender, nondistended, positive bowel sounds Musculoskeletal: no clubbing / cyanosis.  Skin: No new rashes Neurologic: Nonfocal, equal strength Psychiatric: Normal judgment and insight. Alert and oriented x 3. Normal  mood.   Data Reviewed: I have independently reviewed following labs and imaging studies   CBC: Recent Labs  Lab 07/18/18 1721 07/19/18 0310  WBC 4.7 3.8*  HGB 12.7 12.1  HCT 40.7 37.9  MCV 94.4 92.4  PLT 126* 761*   Basic Metabolic Panel: Recent Labs  Lab 07/18/18 1519 07/18/18 1721 07/19/18 0310 07/20/18 0459  NA 135 134* 134* 133*  K 3.6 4.1 3.1* 3.8  CL 98 98 99 99  CO2 26 24 22  20*  GLUCOSE 135* 112* 100* 76  BUN 39* 41* 41* 45*  CREATININE 3.67* 3.71* 3.56* 3.65*  CALCIUM 9.1 8.9 8.6* 8.7*   GFR: Estimated Creatinine Clearance: 10.9 mL/min (A) (by C-G formula based on SCr of 3.65 mg/dL (H)). Liver Function Tests: No results for input(s): AST, ALT, ALKPHOS, BILITOT, PROT, ALBUMIN in the last 168 hours. No results for input(s): LIPASE, AMYLASE in the last 168 hours. No results for input(s): AMMONIA in the last 168 hours. Coagulation Profile: No results for input(s): INR, PROTIME in the last 168 hours. Cardiac Enzymes: Recent Labs  Lab 07/18/18 2140 07/19/18 0310 07/19/18 0805  TROPONINI 0.25* 0.24* 0.25*   BNP (last 3 results) No results for input(s): PROBNP in the last 8760 hours. HbA1C: No results for input(s): HGBA1C in the last 72 hours. CBG: Recent Labs  Lab 07/19/18 1208 07/19/18 1627 07/19/18 2120 07/20/18 0740 07/20/18 1108  GLUCAP 125* 86 109* 75 141*   Lipid Profile: No results for input(s): CHOL, HDL, LDLCALC, TRIG, CHOLHDL, LDLDIRECT in the last 72 hours. Thyroid Function Tests: No results for input(s): TSH, T4TOTAL, FREET4, T3FREE, THYROIDAB in the last 72 hours. Anemia Panel: No results for input(s): VITAMINB12, FOLATE, FERRITIN, TIBC, IRON, RETICCTPCT in the last 72 hours. Urine analysis:    Component Value Date/Time   COLORURINE YELLOW 06/24/2018 2314   APPEARANCEUR HAZY (A) 06/24/2018 2314   LABSPEC 1.013 06/24/2018 2314   PHURINE 6.0 06/24/2018 2314   GLUCOSEU NEGATIVE 06/24/2018 2314   GLUCOSEU NEGATIVE 07/10/2014 1517     HGBUR LARGE (A) 06/24/2018 2314   BILIRUBINUR NEGATIVE 06/24/2018 [place] 06/24/2018 2314   PROTEINUR NEGATIVE 06/24/2018 2314   UROBILINOGEN 0.2 10/06/2014 1552   NITRITE NEGATIVE 06/24/2018 2314   LEUKOCYTESUR MODERATE (A) 06/24/2018 2314   Sepsis Labs: Invalid input(s): PROCALCITONIN, LACTICIDVEN  No results found for this or any previous visit (from the past 240 hour(s)).    Radiology Studies: Dg Chest 2 View  Result Date: 07/18/2018 CLINICAL DATA:  Chronic shortness of breath. History of asthma and CHF. EXAM: CHEST - 2 VIEW COMPARISON:  Chest radiograph June 25, 2017 FINDINGS: Stable cardiomegaly. Mildly calcified aortic arch. No pleural effusion or focal consolidation. No pneumothorax. 3 lead LEFT cardiac pacemaker in situ. Severe LEFT glenohumeral osteoarthrosis. RIGHT shoulder arthroplasty. IMPRESSION: Stable cardiomegaly.  No acute pulmonary process. Aortic Atherosclerosis (ICD10-I70.0). Electronically Signed   By: Elon Alas M.D.   On: 07/18/2018 20:14    Marzetta Board, MD, PhD [facility] Pager [phone]  If 7PM-7AM, please contact night-coverage [email or web address] Password [facility] 07/20/2018, 11:27 AM

## 2018-07-20 NOTE — Progress Notes (Signed)
Pt has home cpap unit. RT added sterile water to water chamber. Pt able to place on self when ready for bed.

## 2018-07-20 NOTE — Progress Notes (Addendum)
MD made aware of patient low urine output this shift.  Patient consulted by nephrology.

## 2018-07-21 ENCOUNTER — Inpatient Hospital Stay (HOSPITAL_COMMUNITY): Payer: Medicare Other

## 2018-07-21 DIAGNOSIS — I370 Nonrheumatic pulmonary valve stenosis: Secondary | ICD-10-CM

## 2018-07-21 DIAGNOSIS — I361 Nonrheumatic tricuspid (valve) insufficiency: Secondary | ICD-10-CM

## 2018-07-21 DIAGNOSIS — I340 Nonrheumatic mitral (valve) insufficiency: Secondary | ICD-10-CM

## 2018-07-21 LAB — BASIC METABOLIC PANEL
ANION GAP: 11 (ref 5–15)
BUN: 44 mg/dL — ABNORMAL HIGH (ref 8–23)
CALCIUM: 8.7 mg/dL — AB (ref 8.9–10.3)
CO2: 24 mmol/L (ref 22–32)
Chloride: 98 mmol/L (ref 98–111)
Creatinine, Ser: 3.4 mg/dL — ABNORMAL HIGH (ref 0.44–1.00)
GFR, EST AFRICAN AMERICAN: 13 mL/min — AB (ref >60)
GFR, EST NON AFRICAN AMERICAN: 11 mL/min — AB (ref >60)
Glucose, Bld: 90 mg/dL (ref 70–99)
POTASSIUM: 3.5 mmol/L (ref 3.5–5.1)
Sodium: 133 mmol/L — ABNORMAL LOW (ref 135–145)

## 2018-07-21 LAB — CBC
HCT: 39.5 % (ref 36.0–46.0)
HEMOGLOBIN: 12 g/dL (ref 12.0–15.0)
MCH: 28.2 pg (ref 26.0–34.0)
MCHC: 30.4 g/dL (ref 30.0–36.0)
MCV: 92.7 fL (ref 80.0–100.0)
NRBC: 0 % (ref 0.0–0.2)
PLATELETS: 123 10*3/uL — AB (ref 150–400)
RBC: 4.26 MIL/uL (ref 3.87–5.11)
RDW: 15.8 % — AB (ref 11.5–15.5)
WBC: 3.3 10*3/uL — ABNORMAL LOW (ref 4.0–10.5)

## 2018-07-21 LAB — GLUCOSE, CAPILLARY
GLUCOSE-CAPILLARY: 133 mg/dL — AB (ref 70–99)
GLUCOSE-CAPILLARY: 135 mg/dL — AB (ref 70–99)
Glucose-Capillary: 89 mg/dL (ref 70–99)
Glucose-Capillary: 128 mg/dL — ABNORMAL HIGH (ref 70–99)

## 2018-07-21 LAB — ECHOCARDIOGRAM COMPLETE
Height: 63 in
Weight: 2665.6 oz

## 2018-07-21 MED ORDER — FUROSEMIDE 10 MG/ML IJ SOLN
120.0000 mg | Freq: Two times a day (BID) | INTRAVENOUS | Status: DC
  Administered 2018-07-21 – 2018-07-24 (×8): 120 mg via INTRAVENOUS
  Filled 2018-07-21: qty 12
  Filled 2018-07-21: qty 10
  Filled 2018-07-21 (×2): qty 12
  Filled 2018-07-21: qty 2
  Filled 2018-07-21: qty 10
  Filled 2018-07-21 (×2): qty 12
  Filled 2018-07-21: qty 10

## 2018-07-21 MED ORDER — MILRINONE LACTATE IN DEXTROSE 20-5 MG/100ML-% IV SOLN
0.1250 ug/kg/min | INTRAVENOUS | Status: DC
  Administered 2018-07-21 – 2018-07-24 (×5): 0.25 ug/kg/min via INTRAVENOUS
  Filled 2018-07-21 (×7): qty 100

## 2018-07-21 NOTE — Evaluation (Signed)
Occupational Therapy Evaluation Patient Details Name: Kim Mullins MRN: [id number] DOB: 08-11-1930 Today's Date: 07/21/2018    History of Present Illness Pt is an 84 y.o. female admitted 07/18/18 from follow-up with cardiologist who noticed increased creatinine; worked up for worsening renal failure in setting of acute CHF exacerbation and recent increase in diuretic use. PMH includes HF, CKD, CVA, HTN, a-fib, OSA, OA.   Clinical Impression   Pt typically functions modified independently in mobility, ADL and IADL. She is working toward writing a bible study. Pt presents with decreased activity tolerance and impaired standing balance. She requires set up to min assist for ADL. Will follow acutely. Recommending HHOT.     Follow Up Recommendations  Home health OT    Equipment Recommendations  None recommended by OT    Recommendations for Other Services       Precautions / Restrictions Precautions Precautions: Fall Restrictions Weight Bearing Restrictions: No      Mobility Bed Mobility               General bed mobility comments: pt in chair, just returned from walk with cardiac rehab  Transfers Overall transfer level: Needs assistance Equipment used: Rolling walker (2 wheeled) Transfers: Sit to/from Stand Sit to Stand: Supervision              Balance Overall balance assessment: Needs assistance   Sitting balance-Leahy Scale: Good       Standing balance-Leahy Scale: Fair Standing balance comment: can release walker without LOB in static standing, hx of knee buckling                           ADL either performed or assessed with clinical judgement   ADL Overall ADL's : Needs assistance/impaired Eating/Feeding: Independent;Sitting   Grooming: Wash/dry hands;Standing;Min guard   Upper Body Bathing: Set up;Sitting   Lower Body Bathing: Minimal assistance;Sit to/from stand   Upper Body Dressing : Set up;Sitting   Lower Body  Dressing: Minimal assistance;Sit to/from stand Lower Body Dressing Details (indicate cue type and reason): increased time and effort, assist for compression hose Toilet Transfer: Min guard;Ambulation;RW;BSC   Toileting- Water quality scientist and Hygiene: Min guard;Sit to/from stand       Functional mobility during ADLs: Min guard;Rolling walker       Vision Baseline Vision/History: Wears glasses Wears Glasses: At all times Patient Visual Report: No change from baseline       Perception     Praxis      Pertinent Vitals/Pain Pain Assessment: No/denies pain     Hand Dominance Right   Extremity/Trunk Assessment Upper Extremity Assessment Upper Extremity Assessment: Generalized weakness   Lower Extremity Assessment Lower Extremity Assessment: Overall WFL for tasks assessed   Cervical / Trunk Assessment Cervical / Trunk Assessment: Kyphotic   Communication Communication Communication: HOH   Cognition Arousal/Alertness: Awake/alert Behavior During Therapy: WFL for tasks assessed/performed Overall Cognitive Status: Within Functional Limits for tasks assessed                                     General Comments       Exercises     Shoulder Instructions      Home Living Family/patient expects to be discharged to:: Private residence Living Arrangements: Alone Available Help at Discharge: Friend(s);Available PRN/intermittently Type of Home: House Home Access: Level entry     Home Layout:  One level     Bathroom Shower/Tub: Teacher, early years/pre: Standard     Home Equipment: Environmental consultant - 2 wheels;Walker - 4 wheels;Cane - single point;Bedside commode;Shower seat;Toilet riser          Prior Functioning/Environment Level of Independence: Independent with assistive device(s)        Comments: Uses RW, rollator, or SPC depending on the day; recently using rollator more, but prefers cane. Has had falls recently (R knee giving out,  tripping on rollator, etc.)        OT Problem List: Decreased strength;Decreased activity tolerance;Impaired balance (sitting and/or standing);Decreased knowledge of use of DME or AE;Cardiopulmonary status limiting activity      OT Treatment/Interventions: Self-care/ADL training;DME and/or AE instruction;Patient/family education;Balance training;Therapeutic activities;Energy conservation    OT Goals(Current goals can be found in the care plan section) Acute Rehab OT Goals Patient Stated Goal: "Return home when I can" OT Goal Formulation: With patient Time For Goal Achievement: 08/04/18 Potential to Achieve Goals: Good ADL Goals Pt Will Perform Grooming: with modified independence;standing Pt Will Perform Lower Body Bathing: with modified independence;sit to/from stand Pt Will Perform Lower Body Dressing: with modified independence;sit to/from stand Pt Will Transfer to Toilet: with modified independence;ambulating Pt Will Perform Toileting - Clothing Manipulation and hygiene: with modified independence;sit to/from stand Additional ADL Goal #1: Pt will incorporate energy conservation strategies during ADL and mobility independently.  OT Frequency: Min 2X/week   Barriers to D/C:            Co-evaluation              AM-PAC PT "6 Clicks" Daily Activity     Outcome Measure Help from another person eating meals?: None Help from another person taking care of personal grooming?: A Little Help from another person toileting, which includes using toliet, bedpan, or urinal?: A Little Help from another person bathing (including washing, rinsing, drying)?: A Little Help from another person to put on and taking off regular upper body clothing?: None Help from another person to put on and taking off regular lower body clothing?: A Little 6 Click Score: 20   End of Session Equipment Utilized During Treatment: Gait belt;Rolling walker Nurse Communication: (aware pt's denture is  missing)  Activity Tolerance: Patient tolerated treatment well Patient left: in chair;with call bell/phone within reach;with family/visitor present  OT Visit Diagnosis: Unsteadiness on feet (R26.81);Other abnormalities of gait and mobility (R26.89);Muscle weakness (generalized) (M62.81);History of falling (Z91.81)                Time: 8250-0370 OT Time Calculation (min): 25 min Charges:  OT General Charges $OT Visit: 1 Visit OT Evaluation $OT Eval Moderate Complexity: 1 Mod OT Treatments $Self Care/Home Management : 8-22 mins  Nestor Lewandowsky, OTR/L Acute Rehabilitation Services Pager: [phone] Office: [phone]  Malka So 07/21/2018, 2:02 PM

## 2018-07-21 NOTE — Progress Notes (Signed)
PROGRESS NOTE  Kim Mullins EPP:[id number] DOB: [birth year] DOA: 07/18/2018 PCP: Lin Landsman, MD   LOS: 3 days   Brief Narrative / Interim history: 84 year old female with history of chronic systolic CHF, nonischemic cardiomyopathy with myocardial amyloid imaging in October 2019 suggestive of transthyretin amyloidosis supposed to start on tafamidis, who was recently evaluated heart failure at the end of October for fluid overload and her Bumex was increased at that time.  She was admitted to the hospital now with worsening renal failure with her creatinine increasing to 3.7 from a baseline of 1.5-1.7.  Subjective: Met patient eating her breakfast, pt reports throat is sore, otherwise, denies any chest pain, SOB while at rest, abdominal pain, N/V, fever/chills  Assessment & Plan: Principal Problem:   AKI (acute kidney injury) ([place]) Active Problems:   Pulmonary HTN ([place])   Acute on chronic systolic CHF (congestive heart failure) (HCC)   Pacemaker   Atrial fibrillation (HCC)   Chronic anticoagulation   Acute on chronic systolic CHF in the setting of nonischemic cardiomyopathy with DTR amyloidosis -She has pitting lower extremity edema, JVD and crackles, suggesting fluid overload.  She poorly responded diuretics.  Consulted CHF team, appreciate input -ECHO: Done on 07/21/2018 showed EF of 35 to 40%, with PA peak pressure of 39 mmHg -Started on IV Lasix 11/20, poor urine output, continues to have significant fluid overload -Held IV lasix for about 18 hrs, Cr a little lower -Cardiology on board, will start milrinone, followed by Lasix 120 mg IV twice daily, if no response, may consider a trial of HD for volume management -Continue to monitor closely on telemetry  Acute kidney injury on chronic kidney disease stage 3-4 ?  Cardiorenal syndrome -Baseline creatinine 1.5-1.7, significantly elevated at 3.7 on admission and not much improved with Lasix Nephrology on board: Agree with  a trial of inotropic support with boluses of Lasix, if no improvement may do a trial of hemodialysis  Hyponatremia -In the setting of fluid overload -Continue fluid restriction, with diuresis  Paroxysmal A. fib -Device interrogated by cardiology, showing intermittent atrial fibrillation.  Continue Eliquis, lower dose due to renal failure -Has a pacemaker, continue Coreg  Hypertension BP on the soft side Monitor closely  Type 2 diabetes mellitus -Controlled, most recent A1c 5.7 but this was in 2017 Continue sliding scale, Accu-Cheks   Scheduled Meds: . apixaban  2.5 mg Oral BID  . carvedilol  6.25 mg Oral BID WC  . insulin aspart  0-9 Units Subcutaneous TID WC  . latanoprost  1 drop Both Eyes QHS  . sodium chloride flush  3 mL Intravenous Q12H  . timolol  1 drop Both Eyes Daily   Continuous Infusions: . sodium chloride    . furosemide 120 mg (07/21/18 1413)  . milrinone 0.25 mcg/kg/min (07/21/18 1052)   PRN Meds:.sodium chloride, simethicone, sodium chloride flush   DVT prophylaxis: Eliquis Code Status: Full code Family Communication: No family at bedside Disposition Plan: To be determined  Consultants:   Cardiology-CHF  Nephrology  Procedures:   None   Antimicrobials:  None    Objective: Vitals:   07/20/18 1500 07/20/18 1937 07/21/18 0414 07/21/18 1217  BP: (!) 87/71 93/76 105/78 (!) 87/71  Pulse: (!) 30 64 62 (!) 59  Resp: _0 Temp: (!) 97.4 F (36.3 C) (!) 97.5 F (36.4 C) 98.6 F (37 C) (!) 97.5 F (36.4 C)  TempSrc: Oral Oral Oral Oral  SpO2: 100% (!) 88% 97% 98%  Weight:  75.6 kg   Height:        Intake/Output Summary (Last 24 hours) at 07/21/2018 1644 Last data filed at 07/21/2018 1500 Gross per 24 hour  Intake 480 ml  Output 600 ml  Net -120 ml   Filed Weights   07/18/18 2229 07/20/18 0559 07/21/18 0414  Weight: 74.1 kg 74.8 kg 75.6 kg    Examination:  General: NAD   Cardiovascular: S1, S2 present  Respiratory:   Bibasilar crackles noted  Abdomen: Soft, nontender, nondistended, bowel sounds present  Musculoskeletal: 1+ bilateral pedal edema noted  Skin: Normal  Psychiatry:  Normal mood  Data Reviewed: I have independently reviewed following labs and imaging studies   CBC: Recent Labs  Lab 07/18/18 1721 07/19/18 0310 07/21/18 0521  WBC 4.7 3.8* 3.3*  HGB 12.7 12.1 12.0  HCT 40.7 37.9 39.5  MCV 94.4 92.4 92.7  PLT 126* 130* 389*   Basic Metabolic Panel: Recent Labs  Lab 07/18/18 1519 07/18/18 1721 07/19/18 0310 07/20/18 0459 07/21/18 0521  NA 135 134* 134* 133* 133*  K 3.6 4.1 3.1* 3.8 3.5  CL 98 98 99 99 98  CO2 _0 20* 24  GLUCOSE 135* 112* 100* 76 90  BUN 39* 41* 41* 45* 44*  CREATININE 3.67* 3.71* 3.56* 3.65* 3.40*  CALCIUM 9.1 8.9 8.6* 8.7* 8.7*   GFR: Estimated Creatinine Clearance: 11.8 mL/min (A) (by C-G formula based on SCr of 3.4 mg/dL (H)). Liver Function Tests: No results for input(s): AST, ALT, ALKPHOS, BILITOT, PROT, ALBUMIN in the last 168 hours. No results for input(s): LIPASE, AMYLASE in the last 168 hours. No results for input(s): AMMONIA in the last 168 hours. Coagulation Profile: No results for input(s): INR, PROTIME in the last 168 hours. Cardiac Enzymes: Recent Labs  Lab 07/18/18 2140 07/19/18 0310 07/19/18 0805  TROPONINI 0.25* 0.24* 0.25*   BNP (last 3 results) No results for input(s): PROBNP in the last 8760 hours. HbA1C: No results for input(s): HGBA1C in the last 72 hours. CBG: Recent Labs  Lab 07/20/18 1108 07/20/18 1628 07/20/18 2135 07/21/18 0735 07/21/18 1137  GLUCAP 141* 138* 99 89 133*   Lipid Profile: No results for input(s): CHOL, HDL, LDLCALC, TRIG, CHOLHDL, LDLDIRECT in the last 72 hours. Thyroid Function Tests: No results for input(s): TSH, T4TOTAL, FREET4, T3FREE, THYROIDAB in the last 72 hours. Anemia Panel: No results for input(s): VITAMINB12, FOLATE, FERRITIN, TIBC, IRON, RETICCTPCT in the last 72  hours. Urine analysis:    Component Value Date/Time   COLORURINE YELLOW 06/24/2018 2314   APPEARANCEUR HAZY (A) 06/24/2018 2314   LABSPEC 1.013 06/24/2018 2314   PHURINE 6.0 06/24/2018 2314   GLUCOSEU NEGATIVE 06/24/2018 2314   GLUCOSEU NEGATIVE 07/10/2014 1517   HGBUR LARGE (A) 06/24/2018 2314   BILIRUBINUR NEGATIVE 06/24/2018 [place] 06/24/2018 2314   PROTEINUR NEGATIVE 06/24/2018 2314   UROBILINOGEN 0.2 10/06/2014 1552   NITRITE NEGATIVE 06/24/2018 2314   LEUKOCYTESUR MODERATE (A) 06/24/2018 2314   Sepsis Labs: Invalid input(s): PROCALCITONIN, LACTICIDVEN  No results found for this or any previous visit (from the past 240 hour(s)).    Radiology Studies: No results found.  Alma Friendly, MD [facility]  If 7PM-7AM, please contact night-coverage [email or web address] 07/21/2018, 4:44 PM

## 2018-07-21 NOTE — Progress Notes (Signed)
Patient ID: Kim Mullins, female   DOB: [birth year], 84 y.o.   MRN: [id number] [facility] Progress Note   Assessment/ Plan:   1.  Acute kidney injury on chronic kidney disease stage III: Appears to be cardiorenal/hemodynamically mediated with RAS activation in the setting of high-dose diuretic use.  Labs this morning show stable renal function without any clear worsening or indications for hemodialysis.  Plans noted for inotropic support with intermittent/bolus doses of furosemide to see if we can successfully volume unload her.  If she fails this, she may warrant a trial of hemodialysis. 2.  Hyponatremia: Secondary to worsening renal function/impaired water handling in the setting of CHF decompensation.    Continue fluid restriction with efforts at diuresis using loop diuretic. 3.  Paroxysmal atrial fibrillation: Currently rate controlled and in sinus rhythm, on Eliquis. 4.  Acute exacerbation of systolic heart failure: With prior history of cardiac amyloidosis yet to begin treatment.  Plans noted for initiation of inotropic support along with diuretics 5.  Hypertension: Blood pressures currently appear to be fairly controlled.  Subjective:   Reports to be feeling better with some shortness of breath with activity but none at rest.   Objective:   BP 105/78 (BP Location: Left Arm)   Pulse 62   Temp 98.6 F (37 C) (Oral)   Resp 18   Ht 5\' 3"  (1.6 m)   Wt 75.6 kg Comment: scale c  SpO2 97%   BMI 29.51 kg/m   Intake/Output Summary (Last 24 hours) at 07/21/2018 0814 Last data filed at 07/21/2018 0417 Gross per 24 hour  Intake 1030 ml  Output 200 ml  Net 830 ml   Weight change: 0.771 kg  Physical Exam: Gen: Comfortably sitting on the side of her bed CVS: Pulse regular rhythm, normal rate, S1 and S2 normal Resp: Diminished breath sounds right base with fine rales left base Abd: Soft, moderately distended, nontender Ext: 2+ lower extremity pitting  edema  Imaging: No results found.  Labs: BMET Recent Labs  Lab 07/18/18 1519 07/18/18 1721 07/19/18 0310 07/20/18 0459 07/21/18 0521  NA 135 134* 134* 133* 133*  K 3.6 4.1 3.1* 3.8 3.5  CL 98 98 99 99 98  CO2 26 24 22  20* 24  GLUCOSE 135* 112* 100* 76 90  BUN 39* 41* 41* 45* 44*  CREATININE 3.67* 3.71* 3.56* 3.65* 3.40*  CALCIUM 9.1 8.9 8.6* 8.7* 8.7*   CBC Recent Labs  Lab 07/18/18 1721 07/19/18 0310 07/21/18 0521  WBC 4.7 3.8* 3.3*  HGB 12.7 12.1 12.0  HCT 40.7 37.9 39.5  MCV 94.4 92.4 92.7  PLT 126* 130* 123*   Medications:    . apixaban  2.5 mg Oral BID  . carvedilol  6.25 mg Oral BID WC  . insulin aspart  0-9 Units Subcutaneous TID WC  . latanoprost  1 drop Both Eyes QHS  . sodium chloride flush  3 mL Intravenous Q12H  . timolol  1 drop Both Eyes Daily   Elmarie Shiley, MD 07/21/2018, 8:14 AM

## 2018-07-21 NOTE — Progress Notes (Signed)
  Echocardiogram 2D Echocardiogram has been performed.  Jennette Dubin 07/21/2018, 2:07 PM

## 2018-07-21 NOTE — Care Management Important Message (Signed)
Important Message  Patient Details  Name: Kim Mullins MRN: [id number] Date of Birth: [birth year]   Medicare Important Message Given:  Yes    Orbie Pyo 07/21/2018, 2:45 PM

## 2018-07-21 NOTE — Progress Notes (Signed)
PT Cancellation Note  Patient Details Name: Teryn Boerema MRN: [id number] DOB: 06/30/1930   Cancelled Treatment:    Reason Eval/Treat Not Completed: Fatigue/lethargy limiting ability to participate. Noted that pt worked with occupational therapy, mobility tech, and cardiac rehab today. Will follow-up for PT treatment. Encouraged OOB mobility with nursing staff over weekend.  Mabeline Caras, PT, DPT Acute Rehabilitation Services  Pager [phone] Office [place] 07/21/2018, 5:08 PM

## 2018-07-21 NOTE — Progress Notes (Signed)
CARDIAC REHAB PHASE I   PRE:  Rate/Rhythm: 66 dual pacing    BP: sitting 98/70    SaO2: wouldn't register  MODE:  Ambulation: 150 ft   POST:  Rate/Rhythm: 104 dual paced    BP: sitting 100/73     SaO2: wouldn't register until got hot pack ->100 RA  Pt walked 80 ft about an hour ago, was reluctant to walk again. Used RW and gait belt and was more steady/safe than reported earlier. Increased SOB with distance, did not stop to rest. Increased distance.  To recliner, SaO2 would not register until we used hot pack, 100 RA. BP stable. Will f/u tomorrow. [place], ACSM 07/21/2018 11:48 AM

## 2018-07-21 NOTE — Progress Notes (Addendum)
Advanced Heart Failure Rounding Note  PCP-Cardiologist: No primary care provider on file.   Subjective:    Feeling about the same this am. Throat sore, thinks may be from wearing her nasal CPAP. No SOB at rest.   Cr 3.56 -> 3.65 -> 3.40. Evening dose of lasix held yesterday. Plan to start on milrinone 0.25 mcg/kg/min this am.  Only about 200 cc of UOP recorded.   Objective:   Weight Range: 75.6 kg Body mass index is 29.51 kg/m.   Vital Signs:   Temp:  [97.4 F (36.3 C)-98.6 F (37 C)] 98.6 F (37 C) (11/22 0414) Pulse Rate:  [30-64] 62 (11/22 0414) Resp:  [18-19] 18 (11/22 0414) BP: (87-105)/(60-78) 105/78 (11/22 0414) SpO2:  [88 %-100 %] 97 % (11/22 0414) Weight:  [75.6 kg] 75.6 kg (11/22 0414) Last BM Date: 07/18/18  Weight change: Filed Weights   07/18/18 2229 07/20/18 0559 07/21/18 0414  Weight: 74.1 kg 74.8 kg 75.6 kg    Intake/Output:   Intake/Output Summary (Last 24 hours) at 07/21/2018 0755 Last data filed at 07/21/2018 0417 Gross per 24 hour  Intake 1030 ml  Output 200 ml  Net 830 ml    Physical Exam    General: NAD HEENT: Normal Neck: Supple. JVP to jaw. Carotids 2+ bilat; no bruits. No thyromegaly or nodule noted. Cor: PMI nondisplaced. RRR, 2/6 TR Lungs: CTAB, normal effort. Abdomen: Soft, non-tender, + distended, no HSM. No bruits or masses. +BS  Extremities: No cyanosis, clubbing, or rash. 2-3+ BLE edema.  Neuro: Alert & orientedx3, cranial nerves grossly intact. moves all 4 extremities w/o difficulty. Affect pleasant   Telemetry   V paced 60-70s, personally reviewed.   EKG    No new tracings.    Labs    CBC Recent Labs    07/19/18 0310 07/21/18 0521  WBC 3.8* 3.3*  HGB 12.1 12.0  HCT 37.9 39.5  MCV 92.4 92.7  PLT 130* 841*   Basic Metabolic Panel Recent Labs    07/20/18 0459 07/21/18 0521  NA 133* 133*  K 3.8 3.5  CL 99 98  CO2 20* 24  GLUCOSE 76 90  BUN 45* 44*  CREATININE 3.65* 3.40*  CALCIUM 8.7* 8.7*    Liver Function Tests No results for input(s): AST, ALT, ALKPHOS, BILITOT, PROT, ALBUMIN in the last 72 hours. No results for input(s): LIPASE, AMYLASE in the last 72 hours. Cardiac Enzymes Recent Labs    07/18/18 2140 07/19/18 0310 07/19/18 0805  TROPONINI 0.25* 0.24* 0.25*    BNP: BNP (last 3 results) Recent Labs    07/18/18 1721  BNP 1,897.3*    ProBNP (last 3 results) No results for input(s): PROBNP in the last 8760 hours.   D-Dimer No results for input(s): DDIMER in the last 72 hours. Hemoglobin A1C No results for input(s): HGBA1C in the last 72 hours. Fasting Lipid Panel No results for input(s): CHOL, HDL, LDLCALC, TRIG, CHOLHDL, LDLDIRECT in the last 72 hours. Thyroid Function Tests No results for input(s): TSH, T4TOTAL, T3FREE, THYROIDAB in the last 72 hours.  Invalid input(s): FREET3  Other results:   Imaging    No results found.   Medications:     Scheduled Medications: . apixaban  2.5 mg Oral BID  . carvedilol  6.25 mg Oral BID WC  . insulin aspart  0-9 Units Subcutaneous TID WC  . latanoprost  1 drop Both Eyes QHS  . sodium chloride flush  3 mL Intravenous Q12H  . timolol  1 drop Both Eyes Daily    Infusions: . sodium chloride    . milrinone      PRN Medications: sodium chloride, simethicone, sodium chloride flush    Patient Profile   Ms Boys is a 84 year old with a history of chronic systolic heart failure, NICM, CHB, OSA, DMII TIA, and PAF.   Myocardial Amyloid Imaging --> 06/08/2018 strongly suggestive of TTR amyloidosis. Visual 2 or 3 with ratio >1.5. She has not started tafamidis.   Admitted with AKI . Creatinine up 3.6 on admit   Assessment/Plan   1. A/C Systolic Heart Failure, NICM EF 40% 5/19. + Cardiac Amyloid TTR.  Medtronic CRT-P. Device interrogation has shown minimal atrial fibrillation and 99% BiV pacing.  - She remains marked volume overloaded despite high dose IV lasix. Lasix held yesterday evening - Will  start milrinone 0.25 mcg/kg/min to augment diuresis and support her heart.  - Will start lasix IV 120 mg BID 1-2 hours after milrinone initiated.  - Holding spiro with ARF.  - May need inotrope to facilitate diuresis.  - If no improvement, will need to consider HD.    2. AKI on CKD Stage III - Creatinine up from 1.7> 3.6.  - Cr 3.40 this am with diuretic holiday. Will discuss dosing of lasix infusion with Dr. Aundra Dubin and Renal.  - No improvement overnight with high dose lasix.  - Denies NSAID use. Concern for amyloid in her kidneys.   3. Amyloidosis, cardiac - PYP score > 1.5  - Needs to start tafamidis depending on goals of care. No change.   4. CHB, Medtronic PPM - Needs EP follow up. Last seen by EP in 2017. No change.   5. DMII - Per primary.   6. PAF - Device interrogation showed intermittent  A fib. No change.  - Continue eliquis at lower dose with age > 80 and Cr > 1.5.   Medication concerns reviewed with patient and pharmacy team. Barriers identified: None at this time.   Length of Stay: 3  [name]  07/21/2018, 7:55 AM  Advanced Heart Failure Team Pager [phone] (M-F; 7a - 4p)  Please contact [facility] for night-coverage after hours (4p -7a ) and weekends on [email or web address]  Patient seen with PA, agree with the above note.    She remains markedly volume overloaded with poor response to high dose IV Lasix initially.  We gave her a diuretic holiday over the last 18 hrs or so.  Creatinine a little lower today at 3.4. Suspect cardiorenal syndrome.    On exam, JVP 16 cm with HJR, 1+ edema to knees, regular rhythm.   Echo pending.   Will start milrinone 0.25 this morning to see if augmentation of cardiac output will help renal function.  After she has been on milrinone for a couple of hours, will start Lasix 120 mg IV bid.   If she does not respond to this, will need to consider trial of HD for volume management.  Discussed with renal this  morning.   [name] 07/21/2018 11:30 AM

## 2018-07-22 LAB — BASIC METABOLIC PANEL
ANION GAP: 11 (ref 5–15)
BUN: 46 mg/dL — ABNORMAL HIGH (ref 8–23)
CALCIUM: 8.2 mg/dL — AB (ref 8.9–10.3)
CHLORIDE: 95 mmol/L — AB (ref 98–111)
CO2: 23 mmol/L (ref 22–32)
Creatinine, Ser: 3.34 mg/dL — ABNORMAL HIGH (ref 0.44–1.00)
GFR calc non Af Amer: 12 mL/min — ABNORMAL LOW (ref >60)
GFR, EST AFRICAN AMERICAN: 13 mL/min — AB (ref >60)
Glucose, Bld: 125 mg/dL — ABNORMAL HIGH (ref 70–99)
Potassium: 3.8 mmol/L (ref 3.5–5.1)
Sodium: 129 mmol/L — ABNORMAL LOW (ref 135–145)

## 2018-07-22 LAB — GLUCOSE, CAPILLARY
GLUCOSE-CAPILLARY: 119 mg/dL — AB (ref 70–99)
GLUCOSE-CAPILLARY: 115 mg/dL — AB (ref 70–99)
Glucose-Capillary: 117 mg/dL — ABNORMAL HIGH (ref 70–99)
Glucose-Capillary: 160 mg/dL — ABNORMAL HIGH (ref 70–99)

## 2018-07-22 LAB — CBC
HEMATOCRIT: 34.2 % — AB (ref 36.0–46.0)
Hemoglobin: 10.9 g/dL — ABNORMAL LOW (ref 12.0–15.0)
MCH: 29.1 pg (ref 26.0–34.0)
MCHC: 31.9 g/dL (ref 30.0–36.0)
MCV: 91.2 fL (ref 80.0–100.0)
NRBC: 0 % (ref 0.0–0.2)
Platelets: 108 10*3/uL — ABNORMAL LOW (ref 150–400)
RBC: 3.75 MIL/uL — AB (ref 3.87–5.11)
RDW: 15.5 % (ref 11.5–15.5)
WBC: 3.7 10*3/uL — ABNORMAL LOW (ref 4.0–10.5)

## 2018-07-22 MED ORDER — CARVEDILOL 3.125 MG PO TABS
3.1250 mg | ORAL_TABLET | Freq: Two times a day (BID) | ORAL | Status: DC
  Administered 2018-07-22 – 2018-07-26 (×6): 3.125 mg via ORAL
  Filled 2018-07-22 (×8): qty 1

## 2018-07-22 MED ORDER — METOLAZONE 2.5 MG PO TABS
2.5000 mg | ORAL_TABLET | Freq: Once | ORAL | Status: AC
  Administered 2018-07-22: 2.5 mg via ORAL
  Filled 2018-07-22: qty 1

## 2018-07-22 MED ORDER — POTASSIUM CHLORIDE CRYS ER 20 MEQ PO TBCR
20.0000 meq | EXTENDED_RELEASE_TABLET | Freq: Once | ORAL | Status: AC
  Administered 2018-07-22: 20 meq via ORAL
  Filled 2018-07-22: qty 1

## 2018-07-22 NOTE — Progress Notes (Signed)
PROGRESS NOTE  Kim Mullins TKZ:[id number] DOB: 12-17-1929 DOA: 07/18/2018 PCP: Lin Landsman, MD   LOS: 4 days   Brief Narrative / Interim history: 84 year old female with history of chronic systolic CHF, nonischemic cardiomyopathy with myocardial amyloid imaging in October 2019 suggestive of transthyretin amyloidosis supposed to start on tafamidis, who was recently evaluated heart failure at the end of October for fluid overload and her Bumex was increased at that time.  She was admitted to the hospital now with worsening renal failure with her creatinine increasing to 3.7 from a baseline of 1.5-1.7.  Subjective: Patient denies any dyspnea at rest, chest pain, nausea/vomiting.  No new complaints  Assessment & Plan: Principal Problem:   AKI (acute kidney injury) ([place]) Active Problems:   Pulmonary HTN ([place])   Acute on chronic systolic CHF (congestive heart failure) (HCC)   Pacemaker   Atrial fibrillation (H[facility])   Chronic anticoagulation   Acute on chronic systolic CHF in the setting of nonischemic cardiomyopathy with DTR amyloidosis -She has pitting lower extremity edema, JVD and crackles, suggesting fluid overload.  She poorly responded diuretics.  Consulted CHF team, appreciate input -ECHO: Done on 07/21/2018 showed EF of 35 to 40%, with PA peak pressure of 39 mmHg -Started on IV Lasix 11/20, poor urine output, continues to have significant fluid overload -Cardiology on board, will start milrinone, followed by Lasix 120 mg IV twice daily, if no response, may consider a trial of HD for volume management. Plan for [place] on Monday -Continue to monitor closely on telemetry  Acute kidney injury on chronic kidney disease stage 3-4 ?  Cardiorenal syndrome -Baseline creatinine 1.5-1.7, significantly elevated at 3.7 on admission and not much improved with Lasix Nephrology on board: Agree with a trial of inotropic support with boluses of Lasix, if no improvement may do a trial of  hemodialysis  Hyponatremia -In the setting of fluid overload -Continue fluid restriction, with diuresis  Paroxysmal A. fib -Device interrogated by cardiology, showing intermittent atrial fibrillation.  Continue Eliquis, lower dose due to renal failure -Has a pacemaker, continue Coreg  Hypertension BP on the soft side Monitor closely  Type 2 diabetes mellitus -Controlled, most recent A1c 5.7 but this was in 2017 Continue sliding scale, Accu-Cheks   Scheduled Meds: . apixaban  2.5 mg Oral BID  . carvedilol  3.125 mg Oral BID WC  . insulin aspart  0-9 Units Subcutaneous TID WC  . latanoprost  1 drop Both Eyes QHS  . sodium chloride flush  3 mL Intravenous Q12H  . timolol  1 drop Both Eyes Daily   Continuous Infusions: . sodium chloride 250 mL (07/21/18 2014)  . furosemide 120 mg (07/22/18 0923)  . milrinone 0.25 mcg/kg/min (07/22/18 0447)   PRN Meds:.sodium chloride, simethicone, sodium chloride flush   DVT prophylaxis: Eliquis Code Status: Full code Family Communication: No family at bedside Disposition Plan: To be determined  Consultants:   Cardiology-CHF  Nephrology  Procedures:   None   Antimicrobials:  None    Objective: Vitals:   07/22/18 0633 07/22/18 0637 07/22/18 0842 07/22/18 1234  BP: 97/62  90/61 (!) 82/57  Pulse: (!) 59  (!) 59 61  Resp: 18  20 18   Temp: 97.6 F (36.4 C)   97.6 F (36.4 C)  TempSrc: Oral   Oral  SpO2: 100%  100% 100%  Weight:  75.5 kg    Height:        Intake/Output Summary (Last 24 hours) at 07/22/2018 1809 Last data filed at  07/22/2018 1747 Gross per 24 hour  Intake 543.44 ml  Output 1150 ml  Net -606.56 ml   Filed Weights   07/20/18 0559 07/21/18 0414 07/22/18 0637  Weight: 74.8 kg 75.6 kg 75.5 kg    Examination:  General: NAD   Cardiovascular: S1, S2 present  Respiratory: Bibasilar crackles noted  Abdomen: Soft, nontender, nondistended, bowel sounds present  Musculoskeletal: +1 bilateral pedal  edema noted  Skin: Normal  Psychiatry: Normal mood  Data Reviewed: I have independently reviewed following labs and imaging studies   CBC: Recent Labs  Lab 07/18/18 1721 07/19/18 0310 07/21/18 0521 07/22/18 0606  WBC 4.7 3.8* 3.3* 3.7*  HGB 12.7 12.1 12.0 10.9*  HCT 40.7 37.9 39.5 34.2*  MCV 94.4 92.4 92.7 91.2  PLT 126* 130* 123* 025*   Basic Metabolic Panel: Recent Labs  Lab 07/18/18 1721 07/19/18 0310 07/20/18 0459 07/21/18 0521 07/22/18 0606  NA 134* 134* 133* 133* 129*  K 4.1 3.1* 3.8 3.5 3.8  CL 98 99 99 98 95*  CO2 24 22 20* 24 23  GLUCOSE 112* 100* 76 90 125*  BUN 41* 41* 45* 44* 46*  CREATININE 3.71* 3.56* 3.65* 3.40* 3.34*  CALCIUM 8.9 8.6* 8.7* 8.7* 8.2*   GFR: Estimated Creatinine Clearance: 12 mL/min (A) (by C-G formula based on SCr of 3.34 mg/dL (H)). Liver Function Tests: No results for input(s): AST, ALT, ALKPHOS, BILITOT, PROT, ALBUMIN in the last 168 hours. No results for input(s): LIPASE, AMYLASE in the last 168 hours. No results for input(s): AMMONIA in the last 168 hours. Coagulation Profile: No results for input(s): INR, PROTIME in the last 168 hours. Cardiac Enzymes: Recent Labs  Lab 07/18/18 2140 07/19/18 0310 07/19/18 0805  TROPONINI 0.25* 0.24* 0.25*   BNP (last 3 results) No results for input(s): PROBNP in the last 8760 hours. HbA1C: No results for input(s): HGBA1C in the last 72 hours. CBG: Recent Labs  Lab 07/21/18 1604 07/21/18 2131 07/22/18 0808 07/22/18 1228 07/22/18 1742  GLUCAP 128* 135* 117* 160* 119*   Lipid Profile: No results for input(s): CHOL, HDL, LDLCALC, TRIG, CHOLHDL, LDLDIRECT in the last 72 hours. Thyroid Function Tests: No results for input(s): TSH, T4TOTAL, FREET4, T3FREE, THYROIDAB in the last 72 hours. Anemia Panel: No results for input(s): VITAMINB12, FOLATE, FERRITIN, TIBC, IRON, RETICCTPCT in the last 72 hours. Urine analysis:    Component Value Date/Time   COLORURINE YELLOW 06/24/2018  2314   APPEARANCEUR HAZY (A) 06/24/2018 2314   LABSPEC 1.013 06/24/2018 2314   PHURINE 6.0 06/24/2018 2314   GLUCOSEU NEGATIVE 06/24/2018 2314   GLUCOSEU NEGATIVE 07/10/2014 1517   HGBUR LARGE (A) 06/24/2018 2314   BILIRUBINUR NEGATIVE 06/24/2018 [place] 06/24/2018 2314   PROTEINUR NEGATIVE 06/24/2018 2314   UROBILINOGEN 0.2 10/06/2014 1552   NITRITE NEGATIVE 06/24/2018 2314   LEUKOCYTESUR MODERATE (A) 06/24/2018 2314   Sepsis Labs: Invalid input(s): PROCALCITONIN, LACTICIDVEN  No results found for this or any previous visit (from the past 240 hour(s)).    Radiology Studies: No results found.  Alma Friendly, MD [facility]  If 7PM-7AM, please contact night-coverage [email or web address] 07/22/2018, 6:09 PM

## 2018-07-22 NOTE — Progress Notes (Signed)
Patient ID: Kim Mullins, female   DOB: Jan 18, 1930, 84 y.o.   MRN: [id number] [facility] Progress Note   Assessment/ Plan:   1.  Acute kidney injury on chronic kidney disease stage III: Appears to be cardiorenal/hemodynamically mediated with RAS activation in the setting of high-dose diuretic use.  Labs this morning show stable renal function without any clear worsening or critical electrolyte abnormalities indicating dialysis.  Continue inotropic support/diuretic therapy.  If she fails this, she may warrant a trial of hemodialysis. 2.  Hyponatremia: Secondary to worsening renal function/impaired water handling in the setting of CHF decompensation. Continue fluid restriction with efforts at diuresis using loop diuretic-unfortunately poor prognostic indicator with regards to re-hospitalization for CHF exacerbation. 3.  Paroxysmal atrial fibrillation: Currently rate controlled and in sinus rhythm, on Eliquis. 4.  Acute exacerbation of systolic heart failure: With prior history of cardiac amyloidosis yet to begin treatment.  Plans noted for initiation of inotropic support along with diuretics 5.  Hypertension: Blood pressures currently appear to be fairly controlled/on lower side.  Subjective:   Reports she continues to have shortness of breath with exertion but none at rest.   Objective:   BP 90/61 (BP Location: Right Arm)   Pulse (!) 59   Temp 97.6 F (36.4 C) (Oral)   Resp 20   Ht 5\' 3"  (1.6 m)   Wt 75.5 kg   SpO2 100%   BMI 29.48 kg/m   Intake/Output Summary (Last 24 hours) at 07/22/2018 1042 Last data filed at 07/22/2018 0500 Gross per 24 hour  Intake 954.53 ml  Output 1350 ml  Net -395.47 ml   Weight change: -0.091 kg  Physical Exam: Gen: Comfortably resting in bed CVS: Pulse regular rhythm, normal rate, S1 and S2 normal Resp: Diminished breath sounds right base with fine rales left base Abd: Soft, moderately distended, nontender Ext: 2+ lower  extremity pitting edema  Imaging: No results found.  Labs: BMET Recent Labs  Lab 07/18/18 1519 07/18/18 1721 07/19/18 0310 07/20/18 0459 07/21/18 0521 07/22/18 0606  NA 135 134* 134* 133* 133* 129*  K 3.6 4.1 3.1* 3.8 3.5 3.8  CL 98 98 99 99 98 95*  CO2 26 24 22  20* 24 23  GLUCOSE 135* 112* 100* 76 90 125*  BUN 39* 41* 41* 45* 44* 46*  CREATININE 3.67* 3.71* 3.56* 3.65* 3.40* 3.34*  CALCIUM 9.1 8.9 8.6* 8.7* 8.7* 8.2*   CBC Recent Labs  Lab 07/18/18 1721 07/19/18 0310 07/21/18 0521 07/22/18 0606  WBC 4.7 3.8* 3.3* 3.7*  HGB 12.7 12.1 12.0 10.9*  HCT 40.7 37.9 39.5 34.2*  MCV 94.4 92.4 92.7 91.2  PLT 126* 130* 123* 108*   Medications:    . apixaban  2.5 mg Oral BID  . carvedilol  3.125 mg Oral BID WC  . insulin aspart  0-9 Units Subcutaneous TID WC  . latanoprost  1 drop Both Eyes QHS  . metolazone  2.5 mg Oral Once  . potassium chloride  20 mEq Oral Once  . sodium chloride flush  3 mL Intravenous Q12H  . timolol  1 drop Both Eyes Daily   Elmarie Shiley, MD 07/22/2018, 10:42 AM

## 2018-07-22 NOTE — Progress Notes (Signed)
Patient ID: Kim Mullins, female   DOB: Jul 11, 1930, 84 y.o.   MRN: [id number]     Advanced Heart Failure Rounding Note  PCP-Cardiologist: No primary care provider on file.   Subjective:    Short of breath when she walks, comfortable at rest.  SBP in 90s-100s.   Yesterday, started milrinone 0.25 mcg/kg/min to try to promote better diuresis.  UOP 1350 cc, I/Os even, weight stable.  She is getting Lasix 120 mg IV bid.   Echo: EF 35-40%, mild MR, mildly dilated RV, severe TR  Objective:   Weight Range: 75.5 kg Body mass index is 29.48 kg/m.   Vital Signs:   Temp:  [97.5 F (36.4 C)-98.1 F (36.7 C)] 97.6 F (36.4 C) (11/23 7867) Pulse Rate:  [52-59] 59 (11/23 0842) Resp:  [18-20] 20 (11/23 0842) BP: (87-108)/(61-75) 90/61 (11/23 0842) SpO2:  [95 %-100 %] 100 % (11/23 0842) Weight:  [75.5 kg] 75.5 kg (11/23 0637) Last BM Date: 07/18/18  Weight change: Filed Weights   07/20/18 0559 07/21/18 0414 07/22/18 0637  Weight: 74.8 kg 75.6 kg 75.5 kg    Intake/Output:   Intake/Output Summary (Last 24 hours) at 07/22/2018 0947 Last data filed at 07/22/2018 0500 Gross per 24 hour  Intake 1179.53 ml  Output 1350 ml  Net -170.47 ml    Physical Exam    General: NAD Neck: JVP 16 cm, no thyromegaly or thyroid nodule.  Lungs: Clear to auscultation bilaterally with normal respiratory effort. CV: Lateral PMI.  Heart regular S1/S2, no S3/S4, no murmur.  1+ edema to knees.   Abdomen: Soft, nontender, no hepatosplenomegaly, no distention.  Skin: Intact without lesions or rashes.  Neurologic: Alert and oriented x 3.  Psych: Normal affect. Extremities: No clubbing or cyanosis.  HEENT: Normal.    Telemetry   A-paced, BiV-paced  EKG    No new tracings.    Labs    CBC Recent Labs    07/21/18 0521 07/22/18 0606  WBC 3.3* 3.7*  HGB 12.0 10.9*  HCT 39.5 34.2*  MCV 92.7 91.2  PLT 123* 544*   Basic Metabolic Panel Recent Labs    07/21/18 0521 07/22/18 0606    NA 133* 129*  K 3.5 3.8  CL 98 95*  CO2 24 23  GLUCOSE 90 125*  BUN 44* 46*  CREATININE 3.40* 3.34*  CALCIUM 8.7* 8.2*   Liver Function Tests No results for input(s): AST, ALT, ALKPHOS, BILITOT, PROT, ALBUMIN in the last 72 hours. No results for input(s): LIPASE, AMYLASE in the last 72 hours. Cardiac Enzymes No results for input(s): CKTOTAL, CKMB, CKMBINDEX, TROPONINI in the last 72 hours.  BNP: BNP (last 3 results) Recent Labs    07/18/18 1721  BNP 1,897.3*    ProBNP (last 3 results) No results for input(s): PROBNP in the last 8760 hours.   D-Dimer No results for input(s): DDIMER in the last 72 hours. Hemoglobin A1C No results for input(s): HGBA1C in the last 72 hours. Fasting Lipid Panel No results for input(s): CHOL, HDL, LDLCALC, TRIG, CHOLHDL, LDLDIRECT in the last 72 hours. Thyroid Function Tests No results for input(s): TSH, T4TOTAL, T3FREE, THYROIDAB in the last 72 hours.  Invalid input(s): FREET3  Other results:   Imaging    No results found.   Medications:     Scheduled Medications: . apixaban  2.5 mg Oral BID  . carvedilol  3.125 mg Oral BID WC  . insulin aspart  0-9 Units Subcutaneous TID WC  . latanoprost  1  drop Both Eyes QHS  . metolazone  2.5 mg Oral Once  . potassium chloride  20 mEq Oral Once  . sodium chloride flush  3 mL Intravenous Q12H  . timolol  1 drop Both Eyes Daily    Infusions: . sodium chloride 250 mL (07/21/18 2014)  . furosemide 120 mg (07/22/18 0923)  . milrinone 0.25 mcg/kg/min (07/22/18 0447)    PRN Medications: sodium chloride, simethicone, sodium chloride flush    Patient Profile   Kim Mullins is a 83 year old with a history of chronic systolic heart failure, NICM, CHB, OSA, DMII TIA, and PAF.   Myocardial Amyloid Imaging --> 06/08/2018 strongly suggestive of TTR amyloidosis. Visual 2 or 3 with ratio >1.5. She has not started tafamidis.   Admitted with AKI. Creatinine up 3.6 on admit    Assessment/Plan   1. Acute on chronic systolic CHF: NICM, EF 69-45% with severe TR on echo this admission. + Cardiac Amyloid TTR.  Medtronic CRT-P. Device interrogation has shown minimal atrial fibrillation and 99% BiV pacing. She appears volume overloaded on exam, has not diuresed well.  Severe TR will make interpretation of JVD difficult.  She is currently on empiric milrinone to try to promote better renal function but I/Os remain even with stable weight. She is short of breath with exertion.  - Lasix 120 mg IV bid today and will give a dose of metolazone 2.5 x 1.   - Continue milrinone 0.25.  - Tentative plan for [facility] Monday to assess filling pressures and PCWP in the face of severe TR.   - If volume overload proves intractable, I think HD would be an option (have discussed with Dr. Posey Pronto).    2. AKI on CKD Stage III: Creatinine up from 1.7> 3.6>3.34 this morning.  Will continue to try to facilitate diuresis.  Renal following, may end up needing HD for volume control.  3. Cardiac amyloidosis: PYP scan positive.  - Needs to start tafamidis depending on goals of care. No change.  4. CHB: Medtronic CRT-P.  - Needs EP follow up. Last seen by EP in 2017. No change.  5. DMII - Per primary.  6. PAF: Device interrogation showed intermittent  A fib. NSR here.  - Continue eliquis at lower dose with age > 80 and Cr > 1.5.   Medication concerns reviewed with patient and pharmacy team. Barriers identified: None at this time.   Length of Stay: 4  [name], MD  07/22/2018, 9:47 AM  Advanced Heart Failure Team Pager [phone] (M-F; 7a - 4p)  Please contact [facility] for night-coverage after hours (4p -7a ) and weekends on [email or web address]

## 2018-07-22 NOTE — Progress Notes (Signed)
CARDIAC REHAB PHASE I   PRE:  Rate/Rhythm: 70 pacing    BP: sitting 99/59    SaO2: 96 RA  MODE:  Ambulation: 150 ft   POST:  Rate/Rhythm: 102 paced    BP: sitting 119/93     SaO2: 97 RA  Pt in bed, watching [employer] football. Able to stand and walk however c/o arthritic pain today. Left leg did n't move as well. Used RW and gait belt. SOB toward end of short walk, to recliner. [place], ACSM 07/22/2018 2:52 PM

## 2018-07-23 DIAGNOSIS — E871 Hypo-osmolality and hyponatremia: Secondary | ICD-10-CM

## 2018-07-23 LAB — GLUCOSE, CAPILLARY
GLUCOSE-CAPILLARY: 129 mg/dL — AB (ref 70–99)
Glucose-Capillary: 156 mg/dL — ABNORMAL HIGH (ref 70–99)
Glucose-Capillary: 93 mg/dL (ref 70–99)
Glucose-Capillary: 102 mg/dL — ABNORMAL HIGH (ref 70–99)

## 2018-07-23 LAB — BASIC METABOLIC PANEL
ANION GAP: 9 (ref 5–15)
BUN: 44 mg/dL — ABNORMAL HIGH (ref 8–23)
CALCIUM: 8.5 mg/dL — AB (ref 8.9–10.3)
CO2: 25 mmol/L (ref 22–32)
Chloride: 99 mmol/L (ref 98–111)
Creatinine, Ser: 2.94 mg/dL — ABNORMAL HIGH (ref 0.44–1.00)
GFR, EST AFRICAN AMERICAN: 16 mL/min — AB (ref >60)
GFR, EST NON AFRICAN AMERICAN: 14 mL/min — AB (ref >60)
Glucose, Bld: 108 mg/dL — ABNORMAL HIGH (ref 70–99)
POTASSIUM: 3.2 mmol/L — AB (ref 3.5–5.1)
Sodium: 133 mmol/L — ABNORMAL LOW (ref 135–145)

## 2018-07-23 LAB — CBC
HCT: 34 % — ABNORMAL LOW (ref 36.0–46.0)
HEMOGLOBIN: 10.8 g/dL — AB (ref 12.0–15.0)
MCH: 28.9 pg (ref 26.0–34.0)
MCHC: 31.8 g/dL (ref 30.0–36.0)
MCV: 90.9 fL (ref 80.0–100.0)
NRBC: 0 % (ref 0.0–0.2)
PLATELETS: 122 10*3/uL — AB (ref 150–400)
RBC: 3.74 MIL/uL — AB (ref 3.87–5.11)
RDW: 15.9 % — ABNORMAL HIGH (ref 11.5–15.5)
WBC: 3.9 10*3/uL — ABNORMAL LOW (ref 4.0–10.5)

## 2018-07-23 MED ORDER — POTASSIUM CHLORIDE CRYS ER 20 MEQ PO TBCR
40.0000 meq | EXTENDED_RELEASE_TABLET | Freq: Once | ORAL | Status: AC
  Administered 2018-07-23: 40 meq via ORAL
  Filled 2018-07-23: qty 2

## 2018-07-23 MED ORDER — SODIUM CHLORIDE 0.9% FLUSH: 3.0000 mL | INTRAVENOUS | Status: DC | PRN

## 2018-07-23 MED ORDER — METOLAZONE 2.5 MG PO TABS
2.5000 mg | ORAL_TABLET | Freq: Once | ORAL | Status: AC
  Administered 2018-07-23: 2.5 mg via ORAL
  Filled 2018-07-23: qty 1

## 2018-07-23 MED ORDER — SODIUM CHLORIDE 0.9 % IV SOLN
INTRAVENOUS | Status: DC
  Administered 2018-07-24: 06:00:00 via INTRAVENOUS

## 2018-07-23 MED ORDER — SODIUM CHLORIDE 0.9% FLUSH
3.0000 mL | Freq: Two times a day (BID) | INTRAVENOUS | Status: DC
  Administered 2018-07-24: 3 mL via INTRAVENOUS

## 2018-07-23 MED ORDER — SODIUM CHLORIDE 0.9 % IV SOLN: 250.0000 mL | INTRAVENOUS | Status: DC | PRN

## 2018-07-23 MED ORDER — ASPIRIN 81 MG PO CHEW
81.0000 mg | CHEWABLE_TABLET | ORAL | Status: AC
  Administered 2018-07-24: 81 mg via ORAL
  Filled 2018-07-23: qty 1

## 2018-07-23 NOTE — H&P (View-Only) (Signed)
Patient ID: Kim Mullins, female   DOB: 06-17-1930, 84 y.o.   MRN: [id number]     Advanced Heart Failure Rounding Note  PCP-Cardiologist: No primary care provider on file.   Subjective:    Short of breath when she walks, comfortable at rest.  SBP in 90s-100s.   Started milrinone 0.25 mcg/kg/min to try to promote better diuresis.  UOP 1000 cc, weight down 1 lb.  She is getting Lasix 120 mg IV bid and got metolazone 2.5 x 1 yesterday.   Echo: EF 35-40%, mild MR, mildly dilated RV, severe TR  Objective:   Weight Range: 74.9 kg Body mass index is 29.25 kg/m.   Vital Signs:   Temp:  [97.6 F (36.4 C)-98 F (36.7 C)] 98 F (36.7 C) (11/24 0643) Pulse Rate:  [60-61] 60 (11/24 0643) Resp:  [18] 18 (11/24 0643) BP: (82-103)/(57-66) 92/60 (11/24 0643) SpO2:  [97 %-100 %] 97 % (11/24 0643) Weight:  [74.9 kg] 74.9 kg (11/24 0300) Last BM Date: 07/18/18  Weight change: Filed Weights   07/21/18 0414 07/22/18 0637 07/23/18 0300  Weight: 75.6 kg 75.5 kg 74.9 kg    Intake/Output:   Intake/Output Summary (Last 24 hours) at Jan 14, 202019 0918 Last data filed at Jan 14, 202019 5573 Gross per 24 hour  Intake 54.59 ml  Output 1100 ml  Net -1045.41 ml    Physical Exam    General: NAD Neck: JVP 16 cm with prominent CV waves, no thyromegaly or thyroid nodule.  Lungs: Clear to auscultation bilaterally with normal respiratory effort. CV: Nondisplaced PMI.  Heart regular S1/S2, no S3/S4, 2/6 HSM LLSB.  1+ edema to knees.  Abdomen: Soft, nontender, no hepatosplenomegaly, no distention.  Skin: Intact without lesions or rashes.  Neurologic: Alert and oriented x 3.  Psych: Normal affect. Extremities: No clubbing or cyanosis.  HEENT: Normal.    Telemetry   A-paced, BiV-paced  EKG    No new tracings.    Labs    CBC Recent Labs    07/22/18 0606 07/23/18 0408  WBC 3.7* 3.9*  HGB 10.9* 10.8*  HCT 34.2* 34.0*  MCV 91.2 90.9  PLT 108* 220*   Basic Metabolic Panel Recent  Labs    07/22/18 0606 07/23/18 0408  NA 129* 133*  K 3.8 3.2*  CL 95* 99  CO2 23 25  GLUCOSE 125* 108*  BUN 46* 44*  CREATININE 3.34* 2.94*  CALCIUM 8.2* 8.5*   Liver Function Tests No results for input(s): AST, ALT, ALKPHOS, BILITOT, PROT, ALBUMIN in the last 72 hours. No results for input(s): LIPASE, AMYLASE in the last 72 hours. Cardiac Enzymes No results for input(s): CKTOTAL, CKMB, CKMBINDEX, TROPONINI in the last 72 hours.  BNP: BNP (last 3 results) Recent Labs    07/18/18 1721  BNP 1,897.3*    ProBNP (last 3 results) No results for input(s): PROBNP in the last 8760 hours.   D-Dimer No results for input(s): DDIMER in the last 72 hours. Hemoglobin A1C No results for input(s): HGBA1C in the last 72 hours. Fasting Lipid Panel No results for input(s): CHOL, HDL, LDLCALC, TRIG, CHOLHDL, LDLDIRECT in the last 72 hours. Thyroid Function Tests No results for input(s): TSH, T4TOTAL, T3FREE, THYROIDAB in the last 72 hours.  Invalid input(s): FREET3  Other results:   Imaging    No results found.   Medications:     Scheduled Medications: . apixaban  2.5 mg Oral BID  . carvedilol  3.125 mg Oral BID WC  . insulin aspart  0-9  Units Subcutaneous TID WC  . latanoprost  1 drop Both Eyes QHS  . metolazone  2.5 mg Oral Once  . potassium chloride  40 mEq Oral Once  . sodium chloride flush  3 mL Intravenous Q12H  . timolol  1 drop Both Eyes Daily    Infusions: . sodium chloride 250 mL (07/22/18 2058)  . furosemide 62 mL/hr at 07/22/18 2135  . milrinone 0.25 mcg/kg/min (07/22/18 2313)    PRN Medications: sodium chloride, simethicone, sodium chloride flush    Patient Profile   Ms Sosinski is a 84 year old with a history of chronic systolic heart failure, NICM, CHB, OSA, DMII TIA, and PAF.   Myocardial Amyloid Imaging --> 06/08/2018 strongly suggestive of TTR amyloidosis. Visual 2 or 3 with ratio >1.5. She has not started tafamidis.   Admitted with AKI.  Creatinine up 3.6 on admit   Assessment/Plan   1. Acute on chronic systolic CHF: NICM, EF 03-88% with severe TR on echo this admission. + Cardiac Amyloid TTR.  Medtronic CRT-P. Device interrogation has shown minimal atrial fibrillation and 99% BiV pacing. She appears volume overloaded on exam, has not diuresed well.  Severe TR will make interpretation of JVD difficult.  She is currently on empiric milrinone to try to promote better renal function.  Got metolazone + Lasix yesterday, creatinine improved but UOP still not vigorous. She is short of breath with exertion.  - Lasix 120 mg IV bid today and will give a dose of metolazone 2.5 x 1 again today.   - Continue milrinone 0.25.  - Plan for [facility] Monday to assess filling pressures and PCWP in the face of severe TR.   - If volume overload proves intractable, I think HD would be an option (have discussed with Dr. Posey Pronto).    2. AKI on CKD Stage III: Creatinine improved somewhat today.  Will continue to try to facilitate diuresis.  Renal following, may end up needing HD for volume control.  3. Cardiac amyloidosis: PYP scan positive.  - Needs to start tafamidis depending on goals of care. No change.  4. CHB: Medtronic CRT-P.  - Needs EP follow up. Last seen by EP in 2017. No change.  5. DMII - Per primary.  6. PAF: Device interrogation showed intermittent  A fib. NSR here.  - Continue eliquis at lower dose with age > 80 and Cr > 1.5.   Medication concerns reviewed with patient and pharmacy team. Barriers identified: None at this time.   Length of Stay: 5  [name], MD  2020/08/217, 9:18 AM  Advanced Heart Failure Team Pager [phone] (M-F; 7a - 4p)  Please contact [facility] for night-coverage after hours (4p -7a ) and weekends on [email or web address]

## 2018-07-23 NOTE — Discharge Instructions (Signed)

## 2018-07-23 NOTE — Progress Notes (Signed)
Patient ID: Kim Mullins, female   DOB: [birth year], 84 y.o.   MRN: [id number]     Advanced Heart Failure Rounding Note  PCP-Cardiologist: No primary care provider on file.   Subjective:    Short of breath when she walks, comfortable at rest.  SBP in 90s-100s.   Started milrinone 0.25 mcg/kg/min to try to promote better diuresis.  UOP 1000 cc, weight down 1 lb.  She is getting Lasix 120 mg IV bid and got metolazone 2.5 x 1 yesterday.   Echo: EF 35-40%, mild MR, mildly dilated RV, severe TR  Objective:   Weight Range: 74.9 kg Body mass index is 29.25 kg/m.   Vital Signs:   Temp:  [97.6 F (36.4 C)-98 F (36.7 C)] 98 F (36.7 C) (11/24 0643) Pulse Rate:  [60-61] 60 (11/24 0643) Resp:  [18] 18 (11/24 0643) BP: (82-103)/(57-66) 92/60 (11/24 0643) SpO2:  [97 %-100 %] 97 % (11/24 0643) Weight:  [74.9 kg] 74.9 kg (11/24 0300) Last BM Date: 07/18/18  Weight change: Filed Weights   07/21/18 0414 07/22/18 0637 07/23/18 0300  Weight: 75.6 kg 75.5 kg 74.9 kg    Intake/Output:   Intake/Output Summary (Last 24 hours) at Apr 13, 202019 0918 Last data filed at Apr 13, 202019 6269 Gross per 24 hour  Intake 54.59 ml  Output 1100 ml  Net -1045.41 ml    Physical Exam    General: NAD Neck: JVP 16 cm with prominent CV waves, no thyromegaly or thyroid nodule.  Lungs: Clear to auscultation bilaterally with normal respiratory effort. CV: Nondisplaced PMI.  Heart regular S1/S2, no S3/S4, 2/6 HSM LLSB.  1+ edema to knees.  Abdomen: Soft, nontender, no hepatosplenomegaly, no distention.  Skin: Intact without lesions or rashes.  Neurologic: Alert and oriented x 3.  Psych: Normal affect. Extremities: No clubbing or cyanosis.  HEENT: Normal.    Telemetry   A-paced, BiV-paced  EKG    No new tracings.    Labs    CBC Recent Labs    07/22/18 0606 07/23/18 0408  WBC 3.7* 3.9*  HGB 10.9* 10.8*  HCT 34.2* 34.0*  MCV 91.2 90.9  PLT 108* 485*   Basic Metabolic Panel Recent  Labs    07/22/18 0606 07/23/18 0408  NA 129* 133*  K 3.8 3.2*  CL 95* 99  CO2 23 25  GLUCOSE 125* 108*  BUN 46* 44*  CREATININE 3.34* 2.94*  CALCIUM 8.2* 8.5*   Liver Function Tests No results for input(s): AST, ALT, ALKPHOS, BILITOT, PROT, ALBUMIN in the last 72 hours. No results for input(s): LIPASE, AMYLASE in the last 72 hours. Cardiac Enzymes No results for input(s): CKTOTAL, CKMB, CKMBINDEX, TROPONINI in the last 72 hours.  BNP: BNP (last 3 results) Recent Labs    07/18/18 1721  BNP 1,897.3*    ProBNP (last 3 results) No results for input(s): PROBNP in the last 8760 hours.   D-Dimer No results for input(s): DDIMER in the last 72 hours. Hemoglobin A1C No results for input(s): HGBA1C in the last 72 hours. Fasting Lipid Panel No results for input(s): CHOL, HDL, LDLCALC, TRIG, CHOLHDL, LDLDIRECT in the last 72 hours. Thyroid Function Tests No results for input(s): TSH, T4TOTAL, T3FREE, THYROIDAB in the last 72 hours.  Invalid input(s): FREET3  Other results:   Imaging    No results found.   Medications:     Scheduled Medications: . apixaban  2.5 mg Oral BID  . carvedilol  3.125 mg Oral BID WC  . insulin aspart  0-9  Units Subcutaneous TID WC  . latanoprost  1 drop Both Eyes QHS  . metolazone  2.5 mg Oral Once  . potassium chloride  40 mEq Oral Once  . sodium chloride flush  3 mL Intravenous Q12H  . timolol  1 drop Both Eyes Daily    Infusions: . sodium chloride 250 mL (07/22/18 2058)  . furosemide 62 mL/hr at 07/22/18 2135  . milrinone 0.25 mcg/kg/min (07/22/18 2313)    PRN Medications: sodium chloride, simethicone, sodium chloride flush    Patient Profile   Kim Mullins is a 84 year old with a history of chronic systolic heart failure, NICM, CHB, OSA, DMII TIA, and PAF.   Myocardial Amyloid Imaging --> 06/08/2018 strongly suggestive of TTR amyloidosis. Visual 2 or 3 with ratio >1.5. She has not started tafamidis.   Admitted with AKI.  Creatinine up 3.6 on admit   Assessment/Plan   1. Acute on chronic systolic CHF: NICM, EF 62-83% with severe TR on echo this admission. + Cardiac Amyloid TTR.  Medtronic CRT-P. Device interrogation has shown minimal atrial fibrillation and 99% BiV pacing. She appears volume overloaded on exam, has not diuresed well.  Severe TR will make interpretation of JVD difficult.  She is currently on empiric milrinone to try to promote better renal function.  Got metolazone + Lasix yesterday, creatinine improved but UOP still not vigorous. She is short of breath with exertion.  - Lasix 120 mg IV bid today and will give a dose of metolazone 2.5 x 1 again today.   - Continue milrinone 0.25.  - Plan for R[facility] Monday to assess filling pressures and PCWP in the face of severe TR.   - If volume overload proves intractable, I think HD would be an option (have discussed with Dr. Posey Pronto).    2. AKI on CKD Stage III: Creatinine improved somewhat today.  Will continue to try to facilitate diuresis.  Renal following, may end up needing HD for volume control.  3. Cardiac amyloidosis: PYP scan positive.  - Needs to start tafamidis depending on goals of care. No change.  4. CHB: Medtronic CRT-P.  - Needs EP follow up. Last seen by EP in 2017. No change.  5. DMII - Per primary.  6. PAF: Device interrogation showed intermittent  A fib. NSR here.  - Continue eliquis at lower dose with age > 80 and Cr > 1.5.   Medication concerns reviewed with patient and pharmacy team. Barriers identified: None at this time.   Length of Stay: 5  [name], MD  05-04-202019, 9:18 AM  Advanced Heart Failure Team Pager [phone] (M-F; 7a - 4p)  Please contact [facility] for night-coverage after hours (4p -7a ) and weekends on [email or web address]

## 2018-07-23 NOTE — Progress Notes (Signed)
PROGRESS NOTE  Kim Mullins INO:[id number] DOB: [birth year] DOA: 07/18/2018 PCP: Lin Landsman, MD   LOS: 5 days   Brief Narrative / Interim history: 84 year old female with history of chronic systolic CHF, nonischemic cardiomyopathy with myocardial amyloid imaging in October 2019 suggestive of transthyretin amyloidosis supposed to start on tafamidis, who was recently evaluated heart failure at the end of October for fluid overload and her Bumex was increased at that time.  She was admitted to the hospital now with worsening renal failure with her creatinine increasing to 3.7 from a baseline of 1.5-1.7.  Subjective: Today, patient reports feeling better, happy to hear her renal function improved some.  Urine output not at its best.  Denies any worsening shortness of breath at rest, chest pain, nausea/vomiting, fever/chills.  Assessment & Plan: Principal Problem:   AKI (acute kidney injury) ([place]) Active Problems:   Pulmonary HTN ([place])   Acute on chronic systolic CHF (congestive heart failure) ([facility])   Pacemaker   Atrial fibrillation ([facility])   Chronic anticoagulation   Acute on chronic systolic CHF in the setting of nonischemic cardiomyopathy with DTR amyloidosis -She has pitting lower extremity edema, JVD and crackles, suggesting fluid overload.  She poorly responded diuretics.  Consulted CHF team, appreciate input -ECHO: Done on 07/21/2018 showed EF of 35 to 40%, with PA peak pressure of 39 mmHg -Started on IV Lasix 11/20, poor urine output, continues to have significant fluid overload Cardiology/HF on board started milrinone, plus lasix drip, with improvement in Cr today 07/23/18, if no further response, may consider a trial of HD for volume management. Plan for [place] on Monday -Continue to monitor closely on telemetry  Acute kidney injury on chronic kidney disease stage 3-4 ?  Cardiorenal syndrome,some improvement today, not so great UOP -Baseline creatinine 1.5-1.7,  significantly elevated at 3.7 on admission and not much improved with Lasix Nephrology on board: Agree with lasix drip and milrinone drip. if no sig improvement may do a trial of hemodialysis  Hyponatremia Improving -In the setting of fluid overload -Continue fluid restriction, with diuresis  Paroxysmal A. fib -Device interrogated by cardiology, showing intermittent atrial fibrillation.  Continue Eliquis, lower dose due to renal failure -Has a pacemaker, continue Coreg  Hypertension BP on the soft side Monitor closely  Type 2 diabetes mellitus -Controlled, most recent A1c 5.7 but this was in 2017 Continue sliding scale, Accu-Cheks   Scheduled Meds: . apixaban  2.5 mg Oral BID  . carvedilol  3.125 mg Oral BID WC  . insulin aspart  0-9 Units Subcutaneous TID WC  . latanoprost  1 drop Both Eyes QHS  . sodium chloride flush  3 mL Intravenous Q12H  . timolol  1 drop Both Eyes Daily   Continuous Infusions: . sodium chloride 250 mL (07/22/18 2058)  . furosemide 120 mg (07/23/18 0933)  . milrinone 0.25 mcg/kg/min (07/22/18 2313)   PRN Meds:.sodium chloride, simethicone, sodium chloride flush   DVT prophylaxis: Eliquis Code Status: Full code Family Communication: No family at bedside Disposition Plan: To be determined  Consultants:   Cardiology-CHF  Nephrology  Procedures:   None   Antimicrobials:  None    Objective: Vitals:   07/22/18 2300 07/22/18 2308 07/23/18 0300 07/23/18 0643  BP: 93/66 103/63  92/60  Pulse:  60  60  Resp:  18  18  Temp:  97.8 F (36.6 C)  98 F (36.7 C)  TempSrc:  Oral  Oral  SpO2:  98%  97%  Weight:   74.9 kg  Height:        Intake/Output Summary (Last 24 hours) at 08-24-202019 1517 Last data filed at 08-24-202019 1404 Gross per 24 hour  Intake 240 ml  Output 600 ml  Net -360 ml   Filed Weights   07/21/18 0414 07/22/18 0637 07/23/18 0300  Weight: 75.6 kg 75.5 kg 74.9 kg    Examination:  General: NAD   Cardiovascular:  S1, S2 present  Respiratory:  Bibasilar crackles noted  Abdomen: Soft, nontender, nondistended, bowel sounds present  Musculoskeletal: Trace bilateral pedal edema noted  Skin: Normal  Psychiatry:  Normal mood  Data Reviewed: I have independently reviewed following labs and imaging studies   CBC: Recent Labs  Lab 07/18/18 1721 07/19/18 0310 07/21/18 0521 07/22/18 0606 07/23/18 0408  WBC 4.7 3.8* 3.3* 3.7* 3.9*  HGB 12.7 12.1 12.0 10.9* 10.8*  HCT 40.7 37.9 39.5 34.2* 34.0*  MCV 94.4 92.4 92.7 91.2 90.9  PLT 126* 130* 123* 108* 400*   Basic Metabolic Panel: Recent Labs  Lab 07/19/18 0310 07/20/18 0459 07/21/18 0521 07/22/18 0606 07/23/18 0408  NA 134* 133* 133* 129* 133*  K 3.1* 3.8 3.5 3.8 3.2*  CL 99 99 98 95* 99  CO2 22 20* 24 23 25   GLUCOSE 100* 76 90 125* 108*  BUN 41* 45* 44* 46* 44*  CREATININE 3.56* 3.65* 3.40* 3.34* 2.94*  CALCIUM 8.6* 8.7* 8.7* 8.2* 8.5*   GFR: Estimated Creatinine Clearance: 13.6 mL/min (A) (by C-G formula based on SCr of 2.94 mg/dL (H)). Liver Function Tests: No results for input(s): AST, ALT, ALKPHOS, BILITOT, PROT, ALBUMIN in the last 168 hours. No results for input(s): LIPASE, AMYLASE in the last 168 hours. No results for input(s): AMMONIA in the last 168 hours. Coagulation Profile: No results for input(s): INR, PROTIME in the last 168 hours. Cardiac Enzymes: Recent Labs  Lab 07/18/18 2140 07/19/18 0310 07/19/18 0805  TROPONINI 0.25* 0.24* 0.25*   BNP (last 3 results) No results for input(s): PROBNP in the last 8760 hours. HbA1C: No results for input(s): HGBA1C in the last 72 hours. CBG: Recent Labs  Lab 07/22/18 1228 07/22/18 1742 07/22/18 2138 07/23/18 0825 07/23/18 1246  GLUCAP 160* 119* 115* 129* 156*   Lipid Profile: No results for input(s): CHOL, HDL, LDLCALC, TRIG, CHOLHDL, LDLDIRECT in the last 72 hours. Thyroid Function Tests: No results for input(s): TSH, T4TOTAL, FREET4, T3FREE, THYROIDAB in the  last 72 hours. Anemia Panel: No results for input(s): VITAMINB12, FOLATE, FERRITIN, TIBC, IRON, RETICCTPCT in the last 72 hours. Urine analysis:    Component Value Date/Time   COLORURINE YELLOW 06/24/2018 2314   APPEARANCEUR HAZY (A) 06/24/2018 2314   LABSPEC 1.013 06/24/2018 2314   PHURINE 6.0 06/24/2018 2314   GLUCOSEU NEGATIVE 06/24/2018 2314   GLUCOSEU NEGATIVE 07/10/2014 1517   HGBUR LARGE (A) 06/24/2018 2314   BILIRUBINUR NEGATIVE 06/24/2018 [place] 06/24/2018 2314   PROTEINUR NEGATIVE 06/24/2018 2314   UROBILINOGEN 0.2 10/06/2014 1552   NITRITE NEGATIVE 06/24/2018 2314   LEUKOCYTESUR MODERATE (A) 06/24/2018 2314   Sepsis Labs: Invalid input(s): PROCALCITONIN, LACTICIDVEN  No results found for this or any previous visit (from the past 240 hour(s)).    Radiology Studies: No results found.  Alma Friendly, MD [facility]  If 7PM-7AM, please contact night-coverage [email or web address] 08-24-202019, 3:17 PM

## 2018-07-23 NOTE — Progress Notes (Signed)
Patient ID: Kim Mullins, female   DOB: 1930/03/05, 84 y.o.   MRN: [id number] [facility] Progress Note   Assessment/ Plan:   1.  Acute kidney injury on chronic kidney disease stage III: Appears to be cardiorenal/hemodynamically mediated with diuretic resistance.  Her weight is down since yesterday and urine output appears to be decreasing in spite of milrinone/furosemide/metolazone.  Labs however, indicating improving renal function/sodium level with some hypokalemia secondary to diuretic use.. 2.  Hyponatremia: Secondary to worsening renal function/impaired water handling in the setting of CHF decompensation.  Improving with diuretics/volume unloading and better urine output. 3.  Paroxysmal atrial fibrillation: Currently rate controlled and in sinus rhythm, on Eliquis. 4.  Acute exacerbation of systolic heart failure: With prior history of cardiac amyloidosis yet to begin treatment.  Plans noted for initiation of inotropic support along with diuretics 5.  Hypertension: Blood pressures currently appear to be fairly controlled/on lower side. 6.  Hypokalemia: Secondary to diuretic induced wasting, ongoing oral replacement  Subjective:   Reports she is feeling better with regards to shortness of breath and encouraged to hear that renal function is not worsening.   Objective:   BP 92/60 (BP Location: Right Arm)   Pulse 60   Temp 98 F (36.7 C) (Oral)   Resp 18   Ht 5\' 3"  (1.6 m)   Wt 74.9 kg   SpO2 97%   BMI 29.25 kg/m   Intake/Output Summary (Last 24 hours) at 04-Apr-202019 0835 Last data filed at 04-Apr-202019 6967 Gross per 24 hour  Intake 54.59 ml  Output 1100 ml  Net -1045.41 ml   Weight change: -0.59 kg  Physical Exam: Gen: Comfortably sitting up on the side of her bed eating breakfast CVS: Pulse regular rhythm, normal rate, S1 and S2 normal Resp: Diminished breath sounds over both bases-poor inspiratory effort Abd: Soft, moderately distended,  nontender Ext: 2+ lower extremity pitting edema  Imaging: No results found.  Labs: BMET Recent Labs  Lab 07/18/18 1519 07/18/18 1721 07/19/18 0310 07/20/18 0459 07/21/18 0521 07/22/18 0606 07/23/18 0408  NA 135 134* 134* 133* 133* 129* 133*  K 3.6 4.1 3.1* 3.8 3.5 3.8 3.2*  CL 98 98 99 99 98 95* 99  CO2 26 24 22  20* 24 23 25   GLUCOSE 135* 112* 100* 76 90 125* 108*  BUN 39* 41* 41* 45* 44* 46* 44*  CREATININE 3.67* 3.71* 3.56* 3.65* 3.40* 3.34* 2.94*  CALCIUM 9.1 8.9 8.6* 8.7* 8.7* 8.2* 8.5*   CBC Recent Labs  Lab 07/19/18 0310 07/21/18 0521 07/22/18 0606 07/23/18 0408  WBC 3.8* 3.3* 3.7* 3.9*  HGB 12.1 12.0 10.9* 10.8*  HCT 37.9 39.5 34.2* 34.0*  MCV 92.4 92.7 91.2 90.9  PLT 130* 123* 108* 122*   Medications:    . apixaban  2.5 mg Oral BID  . carvedilol  3.125 mg Oral BID WC  . insulin aspart  0-9 Units Subcutaneous TID WC  . latanoprost  1 drop Both Eyes QHS  . potassium chloride  40 mEq Oral Once  . sodium chloride flush  3 mL Intravenous Q12H  . timolol  1 drop Both Eyes Daily   Elmarie Shiley, MD 04-Apr-202019, 8:35 AM

## 2018-07-24 ENCOUNTER — Encounter (HOSPITAL_COMMUNITY): Payer: Self-pay | Admitting: Cardiology

## 2018-07-24 ENCOUNTER — Encounter (HOSPITAL_COMMUNITY)
Admission: EM | Disposition: A | Discharge status: Home Health | Payer: Self-pay | Source: Home / Self Care | Attending: Internal Medicine

## 2018-07-24 DIAGNOSIS — I509 Heart failure, unspecified: Secondary | ICD-10-CM

## 2018-07-24 HISTORY — PX: RIGHT HEART CATH: CATH118263

## 2018-07-24 LAB — BASIC METABOLIC PANEL
Anion gap: 12 (ref 5–15)
BUN: 47 mg/dL — AB (ref 8–23)
CHLORIDE: 97 mmol/L — AB (ref 98–111)
CO2: 24 mmol/L (ref 22–32)
CREATININE: 2.99 mg/dL — AB (ref 0.44–1.00)
Calcium: 8.5 mg/dL — ABNORMAL LOW (ref 8.9–10.3)
GFR calc Af Amer: 15 mL/min — ABNORMAL LOW (ref >60)
GFR, EST NON AFRICAN AMERICAN: 13 mL/min — AB (ref >60)
Glucose, Bld: 83 mg/dL (ref 70–99)
Potassium: 4.1 mmol/L (ref 3.5–5.1)
SODIUM: 133 mmol/L — AB (ref 135–145)

## 2018-07-24 LAB — POCT I-STAT 3, VENOUS BLOOD GAS (G3P V)
Acid-Base Excess: 2 mmol/L (ref 0.0–2.0)
BICARBONATE: 23.3 mmol/L (ref 20.0–28.0)
Bicarbonate: 25.8 mmol/L (ref 20.0–28.0)
O2 SAT: 65 %
O2 Saturation: 67 %
PCO2 VEN: 32.3 mmHg — AB (ref 44.0–60.0)
PCO2 VEN: 35.4 mmHg — AB (ref 44.0–60.0)
PH VEN: 7.466 — AB (ref 7.250–7.430)
PH VEN: 7.471 — AB (ref 7.250–7.430)
PO2 VEN: 31 mmHg — AB (ref 32.0–45.0)
TCO2: 24 mmol/L (ref 22–32)
TCO2: 27 mmol/L (ref 22–32)
pO2, Ven: 32 mmHg (ref 32.0–45.0)

## 2018-07-24 LAB — CBC
HEMATOCRIT: 36.9 % (ref 36.0–46.0)
Hemoglobin: 11.8 g/dL — ABNORMAL LOW (ref 12.0–15.0)
MCH: 29.1 pg (ref 26.0–34.0)
MCHC: 32 g/dL (ref 30.0–36.0)
MCV: 90.9 fL (ref 80.0–100.0)
Platelets: 106 10*3/uL — ABNORMAL LOW (ref 150–400)
RBC: 4.06 MIL/uL (ref 3.87–5.11)
RDW: 15.6 % — AB (ref 11.5–15.5)
WBC: 4.1 10*3/uL (ref 4.0–10.5)
nRBC: 0 % (ref 0.0–0.2)

## 2018-07-24 LAB — GLUCOSE, CAPILLARY
GLUCOSE-CAPILLARY: 76 mg/dL (ref 70–99)
Glucose-Capillary: 89 mg/dL (ref 70–99)
Glucose-Capillary: 138 mg/dL — ABNORMAL HIGH (ref 70–99)
Glucose-Capillary: 160 mg/dL — ABNORMAL HIGH (ref 70–99)

## 2018-07-24 SURGERY — RIGHT HEART CATH
Anesthesia: LOCAL

## 2018-07-24 MED ORDER — HEPARIN (PORCINE) IN NACL 1000-0.9 UT/500ML-% IV SOLN
INTRAVENOUS | Status: AC
  Filled 2018-07-24: qty 500

## 2018-07-24 MED ORDER — APIXABAN 2.5 MG PO TABS: 2.5000 mg | ORAL_TABLET | Freq: Two times a day (BID) | ORAL | Status: DC

## 2018-07-24 MED ORDER — HEPARIN (PORCINE) IN NACL 1000-0.9 UT/500ML-% IV SOLN
INTRAVENOUS | Status: DC | PRN
  Administered 2018-07-24: 500 mL

## 2018-07-24 MED ORDER — LIDOCAINE HCL (PF) 1 % IJ SOLN
INTRAMUSCULAR | Status: DC | PRN
  Administered 2018-07-24: 5 mL via INTRADERMAL

## 2018-07-24 MED ORDER — LIDOCAINE HCL (PF) 1 % IJ SOLN
INTRAMUSCULAR | Status: AC
  Filled 2018-07-24: qty 30

## 2018-07-24 MED ORDER — METOLAZONE 2.5 MG PO TABS
2.5000 mg | ORAL_TABLET | Freq: Once | ORAL | Status: AC
  Administered 2018-07-24: 2.5 mg via ORAL
  Filled 2018-07-24: qty 1

## 2018-07-24 SURGICAL SUPPLY — 8 items
CATH BALLN WEDGE 5F 110CM (CATHETERS) ×2 IMPLANT
PACK CARDIAC CATHETERIZATION (CUSTOM PROCEDURE TRAY) ×2 IMPLANT
SHEATH GLIDE SLENDER 4/5FR (SHEATH) ×2 IMPLANT
TRANSDUCER W/STOPCOCK (MISCELLANEOUS) ×2 IMPLANT
TUBING ART PRESS 72  MALE/FEM (TUBING) ×1
TUBING ART PRESS 72 MALE/FEM (TUBING) ×1 IMPLANT
TUBING CIL FLEX 10 FLL-RA (TUBING) ×2 IMPLANT
WIRE EMERALD 3MM-J .025X260CM (WIRE) ×2 IMPLANT

## 2018-07-24 NOTE — Progress Notes (Signed)
PT Cancellation Note  Patient Details Name: Kim Mullins MRN: [id number] DOB: [birth year]   Cancelled Treatment:    Reason Eval/Treat Not Completed: Fatigue/lethargy limiting ability to participate. Pt just finished cardiac rehab, about to be taken for cardiac cath. PT will follow.  Gilda Crease, SPT Acute Rehab [facility] [place] 07/24/2018, 11:54 AM

## 2018-07-24 NOTE — Progress Notes (Addendum)
Patient ID: Kim Mullins, female   DOB: 07-11-1930, 84 y.o.   MRN: [id number]     Advanced Heart Failure Rounding Note  PCP-Cardiologist: No primary care provider on file.   Subjective:    Cr 2.99. Remains on milrinone 0.25 mcg/kg/min to promote better diuresis. Only 450 cc of UOP recorded on 120 mg IV lasix BID + metolazone 2.5 mg daily. Weight shows down 2 lbs, though I/O positive 240 cc.  Feeling OK this am. Feels like she had good UOP yesterday. Has been up to the bathroom and chair without lightheadedness or dizziness. Denies SOB at rest, but remains dyspneic with exertion.   Echo: EF 35-40%, mild MR, mildly dilated RV, severe TR  Objective:   Weight Range: 74.3 kg Body mass index is 29 kg/m.   Vital Signs:   Temp:  [97.7 F (36.5 C)-97.9 F (36.6 C)] 97.7 F (36.5 C) (11/25 0459) Pulse Rate:  [60-62] 60 (11/25 0742) Resp:  [20] 20 (11/25 0459) BP: (85-97)/(43-72) 97/59 (11/25 0742) SpO2:  [94 %-100 %] 100 % (11/25 0459) Weight:  [74.3 kg] 74.3 kg (11/25 0459) Last BM Date: 07/22/18  Weight change: Filed Weights   07/22/18 0637 07/23/18 0300 07/24/18 0459  Weight: 75.5 kg 74.9 kg 74.3 kg    Intake/Output:   Intake/Output Summary (Last 24 hours) at 07/24/2018 0750 Last data filed at 07/24/2018 0700 Gross per 24 hour  Intake 680.84 ml  Output 450 ml  Net 230.84 ml    Physical Exam    General: NAD HEENT: Normal Neck: Supple. JVP to jaw with prominent CV waves.  Cor: PMI nondisplaced. RRR, 2/6 HSM LLSB. 1+ edema to knees.  Lungs: CTAB, normal effort. Abdomen: Soft, non-tender, non-distended, no HSM. No bruits or masses. +BS  Extremities: No cyanosis, clubbing, or rash. R and LLE no edema.  Neuro: Alert & orientedx3, cranial nerves grossly intact. moves all 4 extremities w/o difficulty. Affect pleasant   Telemetry   A-paced and BiV-paced, personally reviewed.   EKG    No new tracings.    Labs    CBC Recent Labs    07/22/18 0606  07/23/18 0408  WBC 3.7* 3.9*  HGB 10.9* 10.8*  HCT 34.2* 34.0*  MCV 91.2 90.9  PLT 108* 793*   Basic Metabolic Panel Recent Labs    07/23/18 0408 07/24/18 0447  NA 133* 133*  K 3.2* 4.1  CL 99 97*  CO2 25 24  GLUCOSE 108* 83  BUN 44* 47*  CREATININE 2.94* 2.99*  CALCIUM 8.5* 8.5*   Liver Function Tests No results for input(s): AST, ALT, ALKPHOS, BILITOT, PROT, ALBUMIN in the last 72 hours. No results for input(s): LIPASE, AMYLASE in the last 72 hours. Cardiac Enzymes No results for input(s): CKTOTAL, CKMB, CKMBINDEX, TROPONINI in the last 72 hours.  BNP: BNP (last 3 results) Recent Labs    07/18/18 1721  BNP 1,897.3*    ProBNP (last 3 results) No results for input(s): PROBNP in the last 8760 hours.   D-Dimer No results for input(s): DDIMER in the last 72 hours. Hemoglobin A1C No results for input(s): HGBA1C in the last 72 hours. Fasting Lipid Panel No results for input(s): CHOL, HDL, LDLCALC, TRIG, CHOLHDL, LDLDIRECT in the last 72 hours. Thyroid Function Tests No results for input(s): TSH, T4TOTAL, T3FREE, THYROIDAB in the last 72 hours.  Invalid input(s): FREET3  Other results:   Imaging    No results found.   Medications:     Scheduled Medications: . apixaban  2.5 mg Oral BID  . carvedilol  3.125 mg Oral BID WC  . insulin aspart  0-9 Units Subcutaneous TID WC  . latanoprost  1 drop Both Eyes QHS  . sodium chloride flush  3 mL Intravenous Q12H  . sodium chloride flush  3 mL Intravenous Q12H  . timolol  1 drop Both Eyes Daily    Infusions: . sodium chloride 250 mL (07/22/18 2058)  . sodium chloride    . sodium chloride 10 mL/hr at 07/24/18 0603  . furosemide 120 mg (07/23/18 1740)  . milrinone 0.25 mcg/kg/min (07/23/18 1908)    PRN Medications: sodium chloride, sodium chloride, simethicone, sodium chloride flush, sodium chloride flush    Patient Profile   Ms Kopf is a 84 year old with a history of chronic systolic heart  failure, NICM, CHB, OSA, DMII TIA, and PAF.   Myocardial Amyloid Imaging --> 06/08/2018 strongly suggestive of TTR amyloidosis. Visual 2 or 3 with ratio >1.5. She has not started tafamidis.   Admitted with AKI. Creatinine up 3.6 on admit   Assessment/Plan   1. Acute on chronic systolic CHF: NICM, EF 72-09% with severe TR on echo this admission. + Cardiac Amyloid TTR.  Medtronic CRT-P. Device interrogation has shown minimal atrial fibrillation and 99% BiV pacing. She appears volume overloaded on exam, has not diuresed well.  Severe TR will make interpretation of JVD difficult.  She is currently on empiric milrinone to try to promote better renal function.  Got metolazone + Lasix yesterday, creatinine improved but UOP still not vigorous. She is short of breath with exertion.  - Give IV lasix 120 mg this am. Will address further based on RHC this am.  - Continue milrinone 0.25.  - Plan for RHC today to assess filling pressures and PCWP in the face of severe TR.   - If volume overload proves intractable, we think HD would be an option (have discussed with Dr. Posey Pronto).    2. AKI on CKD Stage III: Creatinine improved somewhat today.  Will continue to try to facilitate diuresis.   - Renal following, may end up needing HD for volume control. Poor UOP over weekend.  3. Cardiac amyloidosis: PYP scan positive.  - Needs to start tafamidis depending on goals of care. No change.   4. CHB: Medtronic CRT-P.  - Needs EP follow up. Last seen by EP in 2017. No change.  5. DMII - Per primary.  6. PAF: Device interrogation showed intermittent  A fib.  - Has remained in NSR here.  - Continue eliquis at lower dose with age > 80 and Cr > 1.5.   Medication concerns reviewed with patient and pharmacy team. Barriers identified: None at this time.   Length of Stay: [address]  [name]  07/24/2018, 7:50 AM  Advanced Heart Failure Team Pager [phone] (M-F; 7a - 4p)  Please contact [facility]  for night-coverage after hours (4p -7a ) and weekends on [email or web address]  Patient seen with PA, agree with the above note.  RHC done today.  Weight down 2 lbs (I/Os not accurately recorded).   RHC Procedural Findings: Hemodynamics (mmHg) RA mean 16 RV 42/16 PA 41/19, mean 29 PCWP mean 22 Oxygen saturations: PA 66% AO 99% Cardiac Output (Fick) 4.87  Cardiac Index (Fick) 2.74 PVR 1.4 WU  R>L heart failure, suspect severe TR plays a role here. I am going to give her 1 more day of Lasix + metolazone then will try to transition to po and  stop milrinone.  Creatinine relatively stable. Renal following, no indication for dialysis yet but may get there in future.   [name] 07/24/2018 1:29 PM

## 2018-07-24 NOTE — Progress Notes (Signed)
Admit: 07/18/2018 LOS: 6  46F AoCKD3, AoC sCHF exacerbation  Subjective:  . Feels well, still not diuresing well; IV lasix + metolazone . SCr stable, K 4.1. . Denies sig DOE . Updated granddaughter on phone  11/24 0701 - 11/25 0700 In: 680.8 [P.O.:480; I.V.:200.8] Out: 450 [Urine:450]  Filed Weights   07/22/18 0637 07/23/18 0300 07/24/18 0459  Weight: 75.5 kg 74.9 kg 74.3 kg    Scheduled Meds: . apixaban  2.5 mg Oral BID  . carvedilol  3.125 mg Oral BID WC  . insulin aspart  0-9 Units Subcutaneous TID WC  . latanoprost  1 drop Both Eyes QHS  . metolazone  2.5 mg Oral Once  . sodium chloride flush  3 mL Intravenous Q12H  . timolol  1 drop Both Eyes Daily   Continuous Infusions: . sodium chloride 250 mL (07/22/18 2058)  . furosemide 120 mg (07/24/18 0800)  . milrinone 0.25 mcg/kg/min (07/24/18 1450)   PRN Meds:.sodium chloride, simethicone, sodium chloride flush  Current Labs: reviewed   Physical Exam:  Blood pressure 93/65, pulse 66, temperature (!) 97.5 F (36.4 C), temperature source Oral, resp. rate 16, height 5\' 3"  (1.6 m), weight 74.3 kg, SpO2 100 %. RRR, CTAB Trace LEE, compression hose on Nonfocal AAO x3  A 1. AoCKD3, likley cardiorenal; appears recent BL SCr around 1.5 2. AoC sCHF exacerbation; on milrinone; NICM, ? Cardiac amyloid 3. DM2 4. Paroxysmal AFib  P . RHC today, continuing diuretics at current time . Will cont to follow . No need for HD at this time . Daily weights, Daily Renal Panel, Strict I/Os, Avoid nephrotoxins (NSAIDs, judicious IV Contrast)   Pearson Grippe MD 07/24/2018, 4:20 PM  Recent Labs  Lab 07/22/18 0606 07/23/18 0408 07/24/18 0447  NA 129* 133* 133*  K 3.8 3.2* 4.1  CL 95* 99 97*  CO2 23 25 24   GLUCOSE 125* 108* 83  BUN 46* 44* 47*  CREATININE 3.34* 2.94* 2.99*  CALCIUM 8.2* 8.5* 8.5*   Recent Labs  Lab 07/22/18 0606 07/23/18 0408 07/24/18 0447  WBC 3.7* 3.9* 4.1  HGB 10.9* 10.8* 11.8*  HCT 34.2* 34.0*  36.9  MCV 91.2 90.9 90.9  PLT 108* 122* 106*

## 2018-07-24 NOTE — Progress Notes (Signed)
   07/24/18 0459  Vitals  Temp 97.7 F (36.5 C)  Temp Source Oral  BP (!) 85/66  MAP (mmHg) 73  BP Location Left Arm  BP Method Automatic  Patient Position (if appropriate) Lying  Pulse Rate 60  Resp 20  Oxygen Therapy  SpO2 100 %  O2 Device Room Air  Height and Weight  Weight 74.3 kg  Type of Scale Used Standing (Scale C)  Type of Weight Actual  BMI (Calculated) 29.01  pt's BP low, pt is asymptomatic. [place] NP notified, awaiting call back.

## 2018-07-24 NOTE — Progress Notes (Signed)
Occupational Therapy Treatment Patient Details Name: Kim Mullins MRN: [id number] DOB: 1930-06-07 Today's Date: 07/24/2018    History of present illness Pt is an 84 y.o. female admitted 07/18/18 from follow-up with cardiologist who noticed increased creatinine; worked up for worsening renal failure in setting of acute CHF exacerbation and recent increase in diuretic use. PMH includes HF, CKD, CVA, HTN, a-fib, OSA, OA.   OT comments  Pt continues to need min assist to rise from sitting and min guard assist for ambulation with RW. Toileted with min guard assist and performed standing grooming with supervision. Pt returned to bed for cardiac cath. Pt reports she does not feel much better than she did upon admission, but does not feel worse.  Follow Up Recommendations  Home health OT    Equipment Recommendations  None recommended by OT    Recommendations for Other Services      Precautions / Restrictions Precautions Precautions: Fall       Mobility Bed Mobility Overal bed mobility: Independent             General bed mobility comments: returned to bed for cardiac cath  Transfers Overall transfer level: Needs assistance Equipment used: Rolling walker (2 wheeled) Transfers: Sit to/from Stand Sit to Stand: Min assist         General transfer comment: cues for hand placement, use of momentum and min assist to rise    Balance Overall balance assessment: Needs assistance   Sitting balance-Leahy Scale: Good       Standing balance-Leahy Scale: Fair                             ADL either performed or assessed with clinical judgement   ADL Overall ADL's : Needs assistance/impaired     Grooming: Supervision/safety;Standing;Wash/dry hands           Upper Body Dressing : Set up;Sitting Upper Body Dressing Details (indicate cue type and reason): doffed front opening gown     Toilet Transfer: Min guard;Ambulation;RW;BSC   Toileting-  Water quality scientist and Hygiene: Min guard;Sit to/from stand       Functional mobility during ADLs: Passenger transport manager     Praxis      Cognition Arousal/Alertness: Awake/alert Behavior During Therapy: WFL for tasks assessed/performed Overall Cognitive Status: Within Functional Limits for tasks assessed                                          Exercises     Shoulder Instructions       General Comments      Pertinent Vitals/ Pain       Pain Assessment: No/denies pain  Home Living                                          Prior Functioning/Environment              Frequency  Min 2X/week        Progress Toward Goals  OT Goals(current goals can now be found in the care plan section)  Progress towards OT goals: Progressing toward goals  Acute Rehab OT Goals Patient Stated Goal: "Return home  when I can" OT Goal Formulation: With patient Time For Goal Achievement: 08/04/18 Potential to Achieve Goals: Good  Plan Discharge plan remains appropriate    Co-evaluation                 AM-PAC OT "6 Clicks" Daily Activity     Outcome Measure   Help from another person eating meals?: None Help from another person taking care of personal grooming?: A Little Help from another person toileting, which includes using toliet, bedpan, or urinal?: A Little Help from another person bathing (including washing, rinsing, drying)?: A Little Help from another person to put on and taking off regular upper body clothing?: None Help from another person to put on and taking off regular lower body clothing?: A Little 6 Click Score: 20    End of Session Equipment Utilized During Treatment: Gait belt;Rolling walker  OT Visit Diagnosis: Unsteadiness on feet (R26.81);Other abnormalities of gait and mobility (R26.89);Muscle weakness (generalized) (M62.81);History of falling (Z91.81)   Activity  Tolerance Patient tolerated treatment well   Patient Left in bed;with call bell/phone within reach(going to cath lab)   Nurse Communication          Time: 7564-3329 OT Time Calculation (min): 15 min  Charges:  1self care  Nestor Lewandowsky, OTR/L Acute Rehabilitation Services Pager: [phone] Office: [phone]  Kim Mullins 07/24/2018, 12:28 PM

## 2018-07-24 NOTE — Progress Notes (Signed)
PROGRESS NOTE  Kim Mullins  OBS:[id number] DOB: 1930-07-14 DOA: 07/18/2018 PCP: Lin Landsman, MD   Brief Narrative: Kim Mullins is an 84 year old female with history of chronic systolic CHF, nonischemic cardiomyopathy with myocardial amyloid imaging in October 2019 suggestive of transthyretin amyloidosis supposed to start on tafamidis, who was recently evaluated heart failure at the end of October for fluid overload and her Bumex was increased at that time.  She was admitted to the hospital now with worsening renal failure with her creatinine increasing to 3.7 from a baseline of 1.5-1.7. Cardiology was consulted and directed diuresis in concert with nephrology. RHC demonstrated R > L left failure. Suspicion remains that HD may be required for volume removal.  Assessment & Plan: Principal Problem:   AKI (acute kidney injury) ([place]) Active Problems:   Pulmonary HTN ([place])   Acute on chronic systolic CHF (congestive heart failure) (HCC)   Pacemaker   Atrial fibrillation (H[facility])   Chronic anticoagulation  Acute on chronic systolic CHF, NICM, cardiac TTR amyloidosis, severe TR: Limited response to diuretics despite milrinone. Echo 07/21/2018 showed EF of 35 to 40%, with PA peak pressure of 39 mmHg - Per HF team, continue lasix 120mg  IV BID + metolazone today, continuing milrinone.  - Continue strict I/O, fluid restriction, daily weights - Plan to ultimately start tafamidis for amyloidosis.   AKI on stage III CKD: Baseline SCr presumed ~1.5, up to 3.7 on admission.  - Nephrology following - Avoid nephrotoxic medications as able.  Hyponatremia: Due to CHF and possibly impaired renal handling.  - Monitor with diuresis, fluid restriction.   CHB, PAF: Maintaining NSR. Intermittent AFib on device interrogation.  - Continue coreg, has PPM, continue low dose eliquis.   Hypertension:  - Medications as above, continue to monitor  Controlled T2DM:  - Needs updated HbA1c,  suspect still low based on CBGs - Continue SSI for tight control  DVT prophylaxis: Eliquis Code Status: Full Family Communication: Granddaughter by phone this evening Disposition Plan: Uncertain at this time. HH-OT recommended by OT.   Consultants:   Cardiology, HF team  Nephrology  Procedures:   [place] 07/24/2018: [place] Procedural Findings: Hemodynamics (mmHg) RA mean 16 RV 42/16 PA 41/19, mean 29 PCWP mean 22 Oxygen saturations: PA 66% AO 99% Cardiac Output (Fick) 4.87  Cardiac Index (Fick) 2.74 PVR 1.4 WU  Antimicrobials:  None   Subjective: Sleeping, tired from a long day. Feels breathing is ok. Doesn't think she's been urinating much today. No pain. Leg swelling is stable and moderate and constant, only modestly improved with elevation.  Objective: Vitals:   07/24/18 0459 07/24/18 0742 07/24/18 0938 07/24/18 1345  BP: (!) 85/66 (!) 97/59 99/63 93/65   Pulse: 60 60 (!) 59 66  Resp: 20   16  Temp: 97.7 F (36.5 C)   (!) 97.5 F (36.4 C)  TempSrc: Oral   Oral  SpO2: 100%   100%  Weight: 74.3 kg     Height:        Intake/Output Summary (Last 24 hours) at 07/24/2018 1701 Last data filed at 07/24/2018 1029 Gross per 24 hour  Intake 540.78 ml  Output 1350 ml  Net -809.22 ml   Filed Weights   07/22/18 0637 07/23/18 0300 07/24/18 0459  Weight: 75.5 kg 74.9 kg 74.3 kg    Gen: Elderly female in no distress Pulm: Non-labored breathing.  CV: Regular rate and rhythm. +distended neck veins 2+ symmetric bilateral LE edema. GI: Abdomen soft, non-tender, non-distended Ext: Warm, no deformities.  TED hose on.  Skin: No rashes, lesions or ulcers Neuro: Alert and oriented. No focal neurological deficits. Psych: Judgement and insight appear normal. Mood & affect appropriate.   Data Reviewed: I have personally reviewed following labs and imaging studies  CBC: Recent Labs  Lab 07/19/18 0310 07/21/18 0521 07/22/18 0606 07/23/18 0408 07/24/18 0447  WBC 3.8* 3.3*  3.7* 3.9* 4.1  HGB 12.1 12.0 10.9* 10.8* 11.8*  HCT 37.9 39.5 34.2* 34.0* 36.9  MCV 92.4 92.7 91.2 90.9 90.9  PLT 130* 123* 108* 122* 287*   Basic Metabolic Panel: Recent Labs  Lab 07/20/18 0459 07/21/18 0521 07/22/18 0606 07/23/18 0408 07/24/18 0447  NA 133* 133* 129* 133* 133*  K 3.8 3.5 3.8 3.2* 4.1  CL 99 98 95* 99 97*  CO2 20* 24 23 25 24   GLUCOSE 76 90 125* 108* 83  BUN 45* 44* 46* 44* 47*  CREATININE 3.65* 3.40* 3.34* 2.94* 2.99*  CALCIUM 8.7* 8.7* 8.2* 8.5* 8.5*   GFR: Estimated Creatinine Clearance: 13.3 mL/min (A) (by C-G formula based on SCr of 2.99 mg/dL (H)). Liver Function Tests: No results for input(s): AST, ALT, ALKPHOS, BILITOT, PROT, ALBUMIN in the last 168 hours. No results for input(s): LIPASE, AMYLASE in the last 168 hours. No results for input(s): AMMONIA in the last 168 hours. Coagulation Profile: No results for input(s): INR, PROTIME in the last 168 hours. Cardiac Enzymes: Recent Labs  Lab 07/18/18 2140 07/19/18 0310 07/19/18 0805  TROPONINI 0.25* 0.24* 0.25*   BNP (last 3 results) No results for input(s): PROBNP in the last 8760 hours. HbA1C: No results for input(s): HGBA1C in the last 72 hours. CBG: Recent Labs  Lab 07/23/18 1702 07/23/18 2129 07/24/18 0736 07/24/18 1348 07/24/18 1638  GLUCAP 93 102* 89 76 138*   Lipid Profile: No results for input(s): CHOL, HDL, LDLCALC, TRIG, CHOLHDL, LDLDIRECT in the last 72 hours. Thyroid Function Tests: No results for input(s): TSH, T4TOTAL, FREET4, T3FREE, THYROIDAB in the last 72 hours. Anemia Panel: No results for input(s): VITAMINB12, FOLATE, FERRITIN, TIBC, IRON, RETICCTPCT in the last 72 hours. Urine analysis:    Component Value Date/Time   COLORURINE YELLOW 06/24/2018 2314   APPEARANCEUR HAZY (A) 06/24/2018 2314   LABSPEC 1.013 06/24/2018 2314   PHURINE 6.0 06/24/2018 2314   GLUCOSEU NEGATIVE 06/24/2018 2314   GLUCOSEU NEGATIVE 07/10/2014 1517   HGBUR LARGE (A) 06/24/2018 2314     BILIRUBINUR NEGATIVE 06/24/2018 [place] 06/24/2018 2314   PROTEINUR NEGATIVE 06/24/2018 2314   UROBILINOGEN 0.2 10/06/2014 1552   NITRITE NEGATIVE 06/24/2018 2314   LEUKOCYTESUR MODERATE (A) 06/24/2018 2314   No results found for this or any previous visit (from the past 240 hour(s)).    Radiology Studies: No results found.  Scheduled Meds: . apixaban  2.5 mg Oral BID  . carvedilol  3.125 mg Oral BID WC  . insulin aspart  0-9 Units Subcutaneous TID WC  . latanoprost  1 drop Both Eyes QHS  . metolazone  2.5 mg Oral Once  . sodium chloride flush  3 mL Intravenous Q12H  . timolol  1 drop Both Eyes Daily   Continuous Infusions: . sodium chloride 250 mL (07/22/18 2058)  . furosemide 120 mg (07/24/18 0800)  . milrinone 0.25 mcg/kg/min (07/24/18 1450)     LOS: 6 days   Time spent: 25 minutes.  Patrecia Pour, MD [facility] [email or web address] Password [email or web address] 07/24/2018, 5:01 PM

## 2018-07-24 NOTE — Progress Notes (Signed)
CARDIAC REHAB PHASE I   PRE:  Rate/Rhythm: 65 paced  BP:  Sitting: 96/61      SaO2: 92 RA  MODE:  Ambulation: 200 ft   POST:  Rate/Rhythm: 86 paced  BP:  Sitting: 93/73    SaO2: 92 RA   Pt ambulated 230ft in hallway assist of one with rolling walker and gait belt. Pt denied dizziness, SOB, or pain. Pt returned to recliner, call bell and phone within reach. Will continue to follow.  [id number] Rufina Falco, RN BSN 07/24/2018 10:59 AM

## 2018-07-24 NOTE — Interval H&P Note (Signed)
History and Physical Interval Note:  07/24/2018 12:53 PM  Kim Mullins  has presented today for surgery, with the diagnosis of hf  The various methods of treatment have been discussed with the patient and family. After consideration of risks, benefits and other options for treatment, the patient has consented to  Procedure(s): RIGHT HEART CATH (N/A) as a surgical intervention .  The patient's history has been reviewed, patient examined, no change in status, stable for surgery.  I have reviewed the patient's chart and labs.  Questions were answered to the patient's satisfaction.     Asa Fath [employer]

## 2018-07-25 LAB — BASIC METABOLIC PANEL
ANION GAP: 10 (ref 5–15)
Anion gap: 12 (ref 5–15)
BUN: 44 mg/dL — ABNORMAL HIGH (ref 8–23)
BUN: 46 mg/dL — ABNORMAL HIGH (ref 8–23)
CALCIUM: 8.3 mg/dL — AB (ref 8.9–10.3)
CHLORIDE: 96 mmol/L — AB (ref 98–111)
CHLORIDE: 94 mmol/L — AB (ref 98–111)
CO2: 24 mmol/L (ref 22–32)
CO2: 27 mmol/L (ref 22–32)
Calcium: 8.5 mg/dL — ABNORMAL LOW (ref 8.9–10.3)
Creatinine, Ser: 2.76 mg/dL — ABNORMAL HIGH (ref 0.44–1.00)
Creatinine, Ser: 2.9 mg/dL — ABNORMAL HIGH (ref 0.44–1.00)
GFR calc Af Amer: 16 mL/min — ABNORMAL LOW (ref >60)
GFR calc non Af Amer: 15 mL/min — ABNORMAL LOW (ref >60)
GFR, EST AFRICAN AMERICAN: 17 mL/min — AB (ref >60)
GFR, EST NON AFRICAN AMERICAN: 14 mL/min — AB (ref >60)
GLUCOSE: 110 mg/dL — AB (ref 70–99)
Glucose, Bld: 172 mg/dL — ABNORMAL HIGH (ref 70–99)
POTASSIUM: 3.5 mmol/L (ref 3.5–5.1)
Potassium: 2.7 mmol/L — CL (ref 3.5–5.1)
SODIUM: 131 mmol/L — AB (ref 135–145)
Sodium: 132 mmol/L — ABNORMAL LOW (ref 135–145)

## 2018-07-25 LAB — GLUCOSE, CAPILLARY
GLUCOSE-CAPILLARY: 84 mg/dL (ref 70–99)
GLUCOSE-CAPILLARY: 143 mg/dL — AB (ref 70–99)
GLUCOSE-CAPILLARY: 141 mg/dL — AB (ref 70–99)

## 2018-07-25 LAB — HEMOGLOBIN A1C
Hgb A1c MFr Bld: 5.3 % (ref 4.8–5.6)
MEAN PLASMA GLUCOSE: 105.41 mg/dL

## 2018-07-25 LAB — CBC
HEMATOCRIT: 33.7 % — AB (ref 36.0–46.0)
HEMOGLOBIN: 10.9 g/dL — AB (ref 12.0–15.0)
MCH: 29.1 pg (ref 26.0–34.0)
MCHC: 32.3 g/dL (ref 30.0–36.0)
MCV: 90.1 fL (ref 80.0–100.0)
Platelets: 123 10*3/uL — ABNORMAL LOW (ref 150–400)
RBC: 3.74 MIL/uL — ABNORMAL LOW (ref 3.87–5.11)
RDW: 15.7 % — AB (ref 11.5–15.5)
WBC: 3.7 10*3/uL — AB (ref 4.0–10.5)
nRBC: 0 % (ref 0.0–0.2)

## 2018-07-25 MED ORDER — TORSEMIDE 20 MG PO TABS
60.0000 mg | ORAL_TABLET | Freq: Two times a day (BID) | ORAL | Status: DC
  Administered 2018-07-25 (×2): 60 mg via ORAL
  Filled 2018-07-25 (×2): qty 3

## 2018-07-25 MED ORDER — POTASSIUM CHLORIDE CRYS ER 20 MEQ PO TBCR
40.0000 meq | EXTENDED_RELEASE_TABLET | Freq: Once | ORAL | Status: AC
  Administered 2018-07-25: 40 meq via ORAL
  Filled 2018-07-25: qty 2

## 2018-07-25 MED ORDER — POTASSIUM CHLORIDE 10 MEQ/100ML IV SOLN
10.0000 meq | INTRAVENOUS | Status: AC
  Administered 2018-07-25 (×2): 10 meq via INTRAVENOUS
  Filled 2018-07-25: qty 100

## 2018-07-25 MED ORDER — TORSEMIDE 20 MG PO TABS: 40.0000 mg | ORAL_TABLET | Freq: Two times a day (BID) | ORAL | Status: DC

## 2018-07-25 MED ORDER — TORSEMIDE 20 MG PO TABS: 60.0000 mg | ORAL_TABLET | Freq: Two times a day (BID) | ORAL | Status: DC

## 2018-07-25 MED ORDER — POTASSIUM CHLORIDE 10 MEQ/100ML IV SOLN
10.0000 meq | INTRAVENOUS | Status: AC
  Administered 2018-07-25: 10 meq via INTRAVENOUS
  Filled 2018-07-25 (×2): qty 100

## 2018-07-25 NOTE — Progress Notes (Addendum)
Patient ID: Kim Mullins, female   DOB: [birth year], 84 y.o.   MRN: [id number]     Advanced Heart Failure Rounding Note  PCP-Cardiologist: No primary care provider on file.   Subjective:    Cr 2.99 -> 2.76. K 2.7. Negative 640 cc and weight down 1 lb on lasix 120 mg IV BID + metolazone.   Feeling OK this am. Denies SOB. No lightheadedness or dizziness transferring from bed to chair and back.   Echo: EF 35-40%, mild MR, mildly dilated RV, severe TR  RHC 07/24/18 RA mean 16 RV 42/16 PA 41/19, mean 29 PCWP mean 22  Oxygen saturations: PA 66% AO 99%  Cardiac Output (Fick) 4.87  Cardiac Index (Fick) 2.74 PVR 1.4 WU  Objective:   Weight Range: 73.6 kg Body mass index is 28.75 kg/m.   Vital Signs:   Temp:  [97.4 F (36.3 C)-98 F (36.7 C)] 98 F (36.7 C) (11/26 0525) Pulse Rate:  [0-115] 60 (11/26 0525) Resp:  [0-45] 20 (11/26 0525) BP: (91-126)/(57-72) 100/72 (11/26 0525) SpO2:  [0 %-100 %] 100 % (11/26 0525) Weight:  [73.6 kg] 73.6 kg (11/26 0216) Last BM Date: 07/23/18  Weight change: Filed Weights   07/23/18 0300 07/24/18 0459 07/25/18 0216  Weight: 74.9 kg 74.3 kg 73.6 kg    Intake/Output:   Intake/Output Summary (Last 24 hours) at 07/25/2018 0745 Last data filed at 07/25/2018 0700 Gross per 24 hour  Intake 508.89 ml  Output 1150 ml  Net -641.11 ml    Physical Exam    General: NAD HEENT: Normal Neck: Supple. JVP ~11-12 cm, prominent CV waves. Carotids 2+ bilat; no bruits. No thyromegaly or nodule noted. Cor: PMI nondisplaced. RRR, 2/6 HSM LLSB. Trace to 1+ BLE edema.  Lungs: CTAB, normal effort. Abdomen: Soft, non-tender, non-distended, no HSM. No bruits or masses. +BS  Extremities: No cyanosis, clubbing, or rash. R and LLE no edema.  Neuro: Alert & orientedx3, cranial nerves grossly intact. moves all 4 extremities w/o difficulty. Affect pleasant   Telemetry   NSR with BiV pacing, personally reviewed.   EKG    No new tracings.     Labs    CBC Recent Labs    07/24/18 0447 07/25/18 0546  WBC 4.1 3.7*  HGB 11.8* 10.9*  HCT 36.9 33.7*  MCV 90.9 90.1  PLT 106* 034*   Basic Metabolic Panel Recent Labs    07/24/18 0447 07/25/18 0546  NA 133* 132*  K 4.1 2.7*  CL 97* 96*  CO2 24 24  GLUCOSE 83 110*  BUN 47* 44*  CREATININE 2.99* 2.76*  CALCIUM 8.5* 8.3*   Liver Function Tests No results for input(s): AST, ALT, ALKPHOS, BILITOT, PROT, ALBUMIN in the last 72 hours. No results for input(s): LIPASE, AMYLASE in the last 72 hours. Cardiac Enzymes No results for input(s): CKTOTAL, CKMB, CKMBINDEX, TROPONINI in the last 72 hours.  BNP: BNP (last 3 results) Recent Labs    07/18/18 1721  BNP 1,897.3*    ProBNP (last 3 results) No results for input(s): PROBNP in the last 8760 hours.   D-Dimer No results for input(s): DDIMER in the last 72 hours. Hemoglobin A1C Recent Labs    07/25/18 0546  HGBA1C 5.3   Fasting Lipid Panel No results for input(s): CHOL, HDL, LDLCALC, TRIG, CHOLHDL, LDLDIRECT in the last 72 hours. Thyroid Function Tests No results for input(s): TSH, T4TOTAL, T3FREE, THYROIDAB in the last 72 hours.  Invalid input(s): FREET3  Other results:   Imaging  No results found.   Medications:     Scheduled Medications: . apixaban  2.5 mg Oral BID  . carvedilol  3.125 mg Oral BID WC  . insulin aspart  0-9 Units Subcutaneous TID WC  . latanoprost  1 drop Both Eyes QHS  . sodium chloride flush  3 mL Intravenous Q12H  . timolol  1 drop Both Eyes Daily    Infusions: . sodium chloride 250 mL (07/22/18 2058)  . furosemide 120 mg (07/24/18 1853)  . milrinone 0.247 mcg/kg/min (07/25/18 0700)    PRN Medications: sodium chloride, simethicone, sodium chloride flush    Patient Profile   Ms Spaziani is a 84 year old with a history of chronic systolic heart failure, NICM, CHB, OSA, DMII TIA, and PAF.   Myocardial Amyloid Imaging --> 06/08/2018 strongly suggestive of TTR  amyloidosis. Visual 2 or 3 with ratio >1.5. She has not started tafamidis.   Admitted with AKI. Creatinine up 3.6 on admit   Assessment/Plan   1. Acute on chronic systolic CHF: NICM, EF 77-82% with severe TR on echo this admission. + Cardiac Amyloid TTR.  Medtronic CRT-P. Device interrogation has shown minimal atrial fibrillation and 99% BiV pacing. She appears volume overloaded on exam, has not diuresed well.  Severe TR will make interpretation of JVD difficult.  She is currently on empiric milrinone to try to promote better renal function.  Got metolazone + Lasix yesterday, creatinine improved but UOP still not vigorous. Not short of breath at rest.   - Stop lasix gtt. Was on bumex 2 mg daily at home. Will discuss regimen with Dr. Aundra Dubin  - Continue milrinone 0.25.  - RHC 07/24/18 with elevated filling pressures and severe TR. Good CO on milrinone. + PVH.  - If volume overload proves intractable, we think HD would be an option (have discussed with Dr. Posey Pronto).    2. AKI on CKD Stage III: Creatinine improved somewhat today.  Will continue to try to facilitate diuresis.   - Creatinine improving. May end up needing HD for volume control. Poor UOP over weekend.  3. Cardiac amyloidosis: PYP scan positive.  - Needs to start tafamidis depending on goals of care. No change.  4. CHB: Medtronic CRT-P.  - Needs EP follow up. Last seen by EP in 2017. No change.  5. DMII - Per primary.  6. PAF: Device interrogation showed intermittent  A fib.  - Remains in NSR today.  - Continue eliquis at lower dose with age > 6 and Cr > 1.5.  7. Hypokalemia - K 2.7 this am. Will give 3 runs of 10 meq, then 40 meq po.  Will recheck this afternoon.   Medication concerns reviewed with patient and pharmacy team. Barriers identified: None at this time.   Length of Stay: [place], [place]  07/25/2018, 7:45 AM  Advanced Heart Failure Team Pager [phone] (M-F; 7a - 4p)  Please contact [facility]  for night-coverage after hours (4p -7a ) and weekends on [email or web address]  Patient seen with PA, agree with the above note.   She had IV Lasix + metolazone again yesterday.  Symptomatically feels better, has not had vigorous UOP.  Creatinine starting to come down, 2.76 today.  JVP still elevated but has severe TR so has prominent CV waves.  RHC yesterday showed ongoing mild volume overload with PCWP 22.  - I will stop IV Lasix today and transition to torsemide 60 mg po bid.  - Decrease milrinone to 0.125 mcg/kg/min. Will  stop this afternoon.  - Replace K as above.  - Will start tafamidis as outpatient.  - Will need close followup with nephrology.   [name] 07/25/2018 9:15 AM

## 2018-07-25 NOTE — Progress Notes (Signed)
PROGRESS NOTE  Kim Mullins  AJG:[id number] DOB: [birth year] DOA: 07/18/2018 PCP: Lin Landsman, MD   Brief Narrative: Kim Mullins is an 84 year old female with history of chronic systolic CHF, nonischemic cardiomyopathy with myocardial amyloid imaging in October 2019 suggestive of transthyretin amyloidosis supposed to start on tafamidis, who was recently evaluated heart failure at the end of October for fluid overload and her Bumex was increased at that time.  She was admitted to the hospital now with worsening renal failure with her creatinine increasing to 3.7 from a baseline of 1.5-1.7. Cardiology was consulted and directed diuresis in concert with nephrology. RHC demonstrated R > L left failure. Suspicion remains that HD may be required for volume removal.  Assessment & Plan: Principal Problem:   AKI (acute kidney injury) ([place]) Active Problems:   Pulmonary HTN ([place])   Acute on chronic systolic CHF (congestive heart failure) (H[facility])   Pacemaker   Atrial fibrillation ([facility])   Chronic anticoagulation  Acute on chronic systolic CHF, NICM, cardiac TTR amyloidosis, severe TR: Limited response to diuretics despite milrinone. Echo 07/21/2018 showed EF of 35 to 40%, with PA peak pressure of 39 mmHg - Per HF team, transition to po diuresis today; torsemide. Tapering milrinone with plans to stop.  - Continue strict I/O, fluid restriction, daily weights - Plan to ultimately start tafamidis for amyloidosis.   AKI on stage III CKD: Baseline SCr presumed ~1.5, up to 3.7 on admission.  - Nephrology following, will need outpatient follow up. - Avoid nephrotoxic medications as able.  Hypokalemia:  - Supplement  Hyponatremia: Due to CHF and possibly impaired renal handling.  - Monitor with diuresis, fluid restriction.   CHB, PAF: Maintaining NSR. Intermittent AFib on device interrogation.  - Continue coreg, has PPM, continue low dose eliquis.   Hypertension:  - Medications  as above, continue to monitor  Controlled T2DM: HbA1c 5.3% - DC CBGs, SSI.   DVT prophylaxis: Eliquis Code Status: Full Family Communication: None at bedside this AM Disposition Plan: Home with [facility] services pending clinical improvement. Transitioning to PO, possible DC in next 24 hours.  Consultants:   Cardiology, HF team  Nephrology  Procedures:   [place] 07/24/2018: [place] Procedural Findings: Hemodynamics (mmHg) RA mean 16 RV 42/16 PA 41/19, mean 29 PCWP mean 22 Oxygen saturations: PA 66% AO 99% Cardiac Output (Fick) 4.87  Cardiac Index (Fick) 2.74 PVR 1.4 WU  Antimicrobials:  None   Subjective: Feels better this morning. Eating breakfast, encouraged by her progress. Working with PT. Dyspnea is improved Denies chest pain or orthostatic symptoms. Objective: Vitals:   07/25/18 0216 07/25/18 0525 07/25/18 1001 07/25/18 1130  BP:  100/72 (!) 84/56 91/62  Pulse:  60 63 61  Resp:  20  18  Temp:  98 F (36.7 C)  98.6 F (37 C)  TempSrc:  Oral    SpO2:  100% 100% 100%  Weight: 73.6 kg     Height:        Intake/Output Summary (Last 24 hours) at 07/25/2018 1712 Last data filed at 07/25/2018 1341 Gross per 24 hour  Intake 856.59 ml  Output 250 ml  Net 606.59 ml   Filed Weights   07/23/18 0300 07/24/18 0459 07/25/18 0216  Weight: 74.9 kg 74.3 kg 73.6 kg   Gen: Pleasant elderly female in no distress sitting in chair eating breakfast Pulm: Nonlabored breathing, no crackles or wheezes. CV: Regular rate and rhythm. II/VI holosystolic murmur throughout precordium greatest at left sternal border. No rub or gallop.  Distended neck veins with pulsation, trace LE edema.  GI: Abdomen soft, non-tender, non-distended, with normoactive bowel sounds.  Ext: Warm, no deformities Skin: No rashes, lesions or ulcers on visualized skin. Marland Kitchen  Neuro: Alert and oriented. No focal neurological deficits. Psych: Judgement and insight appear fair. Mood euthymic & affect congruent. Behavior is  appropriate.    Data Reviewed: I have personally reviewed following labs and imaging studies  CBC: Recent Labs  Lab 07/21/18 0521 07/22/18 0606 07/23/18 0408 07/24/18 0447 07/25/18 0546  WBC 3.3* 3.7* 3.9* 4.1 3.7*  HGB 12.0 10.9* 10.8* 11.8* 10.9*  HCT 39.5 34.2* 34.0* 36.9 33.7*  MCV 92.7 91.2 90.9 90.9 90.1  PLT 123* 108* 122* 106* 740*   Basic Metabolic Panel: Recent Labs  Lab 07/22/18 0606 07/23/18 0408 07/24/18 0447 07/25/18 0546 07/25/18 1400  NA 129* 133* 133* 132* 131*  K 3.8 3.2* 4.1 2.7* 3.5  CL 95* 99 97* 96* 94*  CO2 23 25 24 24 27   GLUCOSE 125* 108* 83 110* 172*  BUN 46* 44* 47* 44* 46*  CREATININE 3.34* 2.94* 2.99* 2.76* 2.90*  CALCIUM 8.2* 8.5* 8.5* 8.3* 8.5*   Recent Labs  Lab 07/18/18 2140 07/19/18 0310 07/19/18 0805  TROPONINI 0.25* 0.24* 0.25*    LOS: 7 days   Time spent: 25 minutes.  Patrecia Pour, MD [facility] [email or web address] Password [facility] 07/25/2018, 5:12 PM

## 2018-07-25 NOTE — Progress Notes (Signed)
Admit: 07/18/2018 LOS: 7  4F AoCKD3, AoC sCHF exacerbation  Subjective:  . Inc UOP . SCr improving . Feels well, friends visiting . K 2.7, repleted . Milrinone weening . Moving to PO Torsemide per AHF . Updated granddaughter on phone  11/25 0701 - 11/26 0700 In: 508.9 [P.O.:240; I.V.:144.9; IV Piggyback:124] Out: 1150 [Urine:1150]  Filed Weights   07/23/18 0300 07/24/18 0459 07/25/18 0216  Weight: 74.9 kg 74.3 kg 73.6 kg    Scheduled Meds: . apixaban  2.5 mg Oral BID  . carvedilol  3.125 mg Oral BID WC  . insulin aspart  0-9 Units Subcutaneous TID WC  . latanoprost  1 drop Both Eyes QHS  . sodium chloride flush  3 mL Intravenous Q12H  . timolol  1 drop Both Eyes Daily  . torsemide  60 mg Oral BID   Continuous Infusions: . sodium chloride 250 mL (07/22/18 2058)  . milrinone 0.125 mcg/kg/min (07/25/18 1000)  . potassium chloride Stopped (07/25/18 1136)  . potassium chloride 10 mEq (07/25/18 1139)   PRN Meds:.sodium chloride, simethicone, sodium chloride flush  Current Labs: reviewed   Physical Exam:  Blood pressure 91/62, pulse 61, temperature 98.6 F (37 C), resp. rate 18, height 5\' 3"  (1.6 m), weight 73.6 kg, SpO2 100 %. RRR, CTAB Trace LEE, compression hose on Nonfocal AAO x3  A 1. AoCKD3, likley cardiorenal; appears recent BL SCr around 1.5 2. AoC sCHF exacerbation; on milrinone; NICM, ? Cardiac amyloid 3. DM2 4. Paroxysmal AFib  P . Diuretics converted to PO . Will cont to follow . Will arrange f/u appt at [place] . Daily weights, Daily Renal Panel, Strict I/Os, Avoid nephrotoxins (NSAIDs, judicious IV Contrast)   Pearson Grippe MD 07/25/2018, 11:49 AM  Recent Labs  Lab 07/23/18 0408 07/24/18 0447 07/25/18 0546  NA 133* 133* 132*  K 3.2* 4.1 2.7*  CL 99 97* 96*  CO2 25 24 24   GLUCOSE 108* 83 110*  BUN 44* 47* 44*  CREATININE 2.94* 2.99* 2.76*  CALCIUM 8.5* 8.5* 8.3*   Recent Labs  Lab 07/23/18 0408 07/24/18 0447 07/25/18 0546  WBC 3.9*  4.1 3.7*  HGB 10.8* 11.8* 10.9*  HCT 34.0* 36.9 33.7*  MCV 90.9 90.9 90.1  PLT 122* 106* 123*

## 2018-07-25 NOTE — Progress Notes (Signed)
Patient placed on her home CPAP. Cords in good condition.

## 2018-07-25 NOTE — Progress Notes (Signed)
Physical Therapy Treatment Patient Details Name: Kim Mullins MRN: [id number] DOB: 01-17-1930 Today's Date: 07/25/2018    History of Present Illness Pt is an 84 y.o. female admitted 07/18/18 from follow-up with cardiologist who noticed increased creatinine; worked up for worsening renal failure in setting of acute CHF exacerbation and recent increase in diuretic use. S/p RHC 11/25. PMH includes HF, CKD, CVA, HTN, a-fib, OSA, OA.   PT Comments    Pt progressing with mobility. Stability improved ambulating with RW versus SPC. Pt agreeable to use RW or rollator at home. Pt with some difficulty standing from lower surface heights; performed repeated sit<>stands, and pt able to progress to performing these without any physical assist. Pt motivated to return home tomorrow if able.     Follow Up Recommendations  Home health PT;Supervision - Intermittent     Equipment Recommendations  None recommended by PT    Recommendations for Other Services       Precautions / Restrictions Precautions Precautions: Fall Restrictions Weight Bearing Restrictions: No    Mobility  Bed Mobility               General bed mobility comments: Received sitting in recliner  Transfers Overall transfer level: Needs assistance Equipment used: Rolling walker (2 wheeled) Transfers: Sit to/from Stand           General transfer comment: Able to stand on third attempt when cued to use BUEs on arm rests, reliant on momentum, no physical assist required. After ambulating, performed 5x sit<>stand with supervision; increased time and effort  Ambulation/Gait Ambulation/Gait assistance: Supervision Gait Distance (Feet): 120 Feet Assistive device: Rolling walker (2 wheeled) Gait Pattern/deviations: Step-through pattern;Decreased stride length;Trunk flexed Gait velocity: Decreased Gait velocity interpretation: 1.31 - 2.62 ft/sec, indicative of limited community ambulator General Gait Details:  Stability improved wtih use of RW; supervision for safety. DOE 3/4, pt not taking any standing rest breaks. SpO2 98% on RA   Stairs             Wheelchair Mobility    Modified Rankin (Stroke Patients Only)       Balance Overall balance assessment: Needs assistance   Sitting balance-Leahy Scale: Good       Standing balance-Leahy Scale: Fair Standing balance comment: Can static stand without UE support                            Cognition Arousal/Alertness: Awake/alert Behavior During Therapy: WFL for tasks assessed/performed Overall Cognitive Status: Within Functional Limits for tasks assessed                                        Exercises      General Comments General comments (skin integrity, edema, etc.): Friends present during session      Pertinent Vitals/Pain Pain Assessment: No/denies pain    Home Living                      Prior Function            PT Goals (current goals can now be found in the care plan section) Acute Rehab PT Goals Patient Stated Goal: "Return home when I can" PT Goal Formulation: With patient Time For Goal Achievement: 08/03/18 Potential to Achieve Goals: Good Progress towards PT goals: Progressing toward goals    Frequency  Min 3X/week      PT Plan Current plan remains appropriate    Co-evaluation              AM-PAC PT "6 Clicks" Mobility   Outcome Measure  Help needed turning from your back to your side while in a flat bed without using bedrails?: None Help needed moving from lying on your back to sitting on the side of a flat bed without using bedrails?: None Help needed moving to and from a bed to a chair (including a wheelchair)?: A Little Help needed standing up from a chair using your arms (e.g., wheelchair or bedside chair)?: A Little Help needed to walk in hospital room?: A Little Help needed climbing 3-5 steps with a railing? : A Little 6 Click Score:  20    End of Session Equipment Utilized During Treatment: Gait belt Activity Tolerance: Patient tolerated treatment well Patient left: in chair;with call bell/phone within reach;with family/visitor present Nurse Communication: Mobility status PT Visit Diagnosis: Other abnormalities of gait and mobility (R26.89);Repeated falls (R29.6)     Time: 8875-7972 PT Time Calculation (min) (ACUTE ONLY): 19 min  Charges:  $Gait Training: 8-22 mins                    Mabeline Caras, PT, DPT Acute Rehabilitation Services  Pager [phone] Office [place] 07/25/2018, 11:39 AM

## 2018-07-26 LAB — CBC
HCT: 36.3 % (ref 36.0–46.0)
HEMOGLOBIN: 11.3 g/dL — AB (ref 12.0–15.0)
MCH: 28.2 pg (ref 26.0–34.0)
MCHC: 31.1 g/dL (ref 30.0–36.0)
MCV: 90.5 fL (ref 80.0–100.0)
NRBC: 0 % (ref 0.0–0.2)
PLATELETS: 135 10*3/uL — AB (ref 150–400)
RBC: 4.01 MIL/uL (ref 3.87–5.11)
RDW: 15.6 % — ABNORMAL HIGH (ref 11.5–15.5)
WBC: 3.6 10*3/uL — ABNORMAL LOW (ref 4.0–10.5)

## 2018-07-26 LAB — BASIC METABOLIC PANEL
Anion gap: 10 (ref 5–15)
BUN: 42 mg/dL — AB (ref 8–23)
CO2: 28 mmol/L (ref 22–32)
CREATININE: 2.7 mg/dL — AB (ref 0.44–1.00)
Calcium: 8.6 mg/dL — ABNORMAL LOW (ref 8.9–10.3)
Chloride: 96 mmol/L — ABNORMAL LOW (ref 98–111)
GFR calc Af Amer: 18 mL/min — ABNORMAL LOW (ref >60)
GFR, EST NON AFRICAN AMERICAN: 15 mL/min — AB (ref >60)
Glucose, Bld: 88 mg/dL (ref 70–99)
POTASSIUM: 3.2 mmol/L — AB (ref 3.5–5.1)
SODIUM: 134 mmol/L — AB (ref 135–145)

## 2018-07-26 MED ORDER — POTASSIUM CHLORIDE CRYS ER 20 MEQ PO TBCR: 20.0000 meq | EXTENDED_RELEASE_TABLET | Freq: Every day | ORAL | 0 refills | Status: DC

## 2018-07-26 MED ORDER — BUMETANIDE 2 MG PO TABS
3.0000 mg | ORAL_TABLET | Freq: Two times a day (BID) | ORAL | Status: DC
  Administered 2018-07-26: 3 mg via ORAL
  Filled 2018-07-26 (×2): qty 1

## 2018-07-26 MED ORDER — POTASSIUM CHLORIDE CRYS ER 20 MEQ PO TBCR: 20.0000 meq | EXTENDED_RELEASE_TABLET | Freq: Once | ORAL | Status: DC

## 2018-07-26 MED ORDER — APIXABAN 2.5 MG PO TABS: 2.5000 mg | ORAL_TABLET | Freq: Two times a day (BID) | ORAL | 0 refills | Status: DC

## 2018-07-26 MED ORDER — CARVEDILOL 3.125 MG PO TABS: 3.1250 mg | ORAL_TABLET | Freq: Two times a day (BID) | ORAL | 0 refills | Status: DC

## 2018-07-26 MED ORDER — POTASSIUM CHLORIDE CRYS ER 20 MEQ PO TBCR
40.0000 meq | EXTENDED_RELEASE_TABLET | Freq: Once | ORAL | Status: AC
  Administered 2018-07-26: 40 meq via ORAL
  Filled 2018-07-26: qty 2

## 2018-07-26 MED ORDER — BUMETANIDE 1 MG PO TABS: 3.0000 mg | ORAL_TABLET | Freq: Two times a day (BID) | ORAL | 0 refills | Status: DC

## 2018-07-26 NOTE — Care Management Important Message (Signed)
Important Message  Patient Details  Name: Kim Mullins MRN: [id number] Date of Birth: October 30, 1929   Medicare Important Message Given:  Yes    Kenyotta Dorfman P Sebastian Lurz 07/26/2018, 3:48 PM

## 2018-07-26 NOTE — Progress Notes (Signed)
Admit: 07/18/2018 LOS: 8  38F AoCKD3, AoC sCHF exacerbation  Subjective:  . No events overnight, for discharge later today . Serum creatinine is stable  . Incomplete measurement of urine output . Off milrinone . Feels great   11/26 0701 - 11/27 0700 In: 484.2 [P.O.:200; I.V.:63.1; IV Piggyback:221.2] Out: 400 [Urine:400]  Filed Weights   07/24/18 0459 07/25/18 0216 07/26/18 0451  Weight: 74.3 kg 73.6 kg 73.2 kg    Scheduled Meds: . apixaban  2.5 mg Oral BID  . bumetanide  3 mg Oral BID  . carvedilol  3.125 mg Oral BID WC  . latanoprost  1 drop Both Eyes QHS  . potassium chloride  20 mEq Oral Once  . sodium chloride flush  3 mL Intravenous Q12H  . timolol  1 drop Both Eyes Daily   Continuous Infusions: . sodium chloride 250 mL (07/22/18 2058)   PRN Meds:.sodium chloride, simethicone, sodium chloride flush  Current Labs: reviewed   Physical Exam:  Blood pressure 110/80, pulse 62, temperature (!) 97.4 F (36.3 C), resp. rate 18, height 5\' 3"  (1.6 m), weight 73.2 kg, SpO2 96 %. RRR, CTAB Trace LEE, compression hose on Nonfocal AAO x3  A 1. AoCKD3, likley cardiorenal; appears recent BL SCr around 1.5; stable 2. AoC sCHF exacerbation; off milrinone; NICM, ? Cardiac amyloid 3. DM2 4. Paroxysmal AFib on noac  P . Will arrange f/u appt at [place] with myself or Dr. Posey Pronto . Reviewed dietary Na restriction, NSAID avoidance . Daily weights, Daily Renal Panel, Strict I/Os, Avoid nephrotoxins (NSAIDs, judicious IV Contrast)   Pearson Grippe MD 07/26/2018, 11:22 AM  Recent Labs  Lab 07/25/18 0546 07/25/18 1400 07/26/18 0545  NA 132* 131* 134*  K 2.7* 3.5 3.2*  CL 96* 94* 96*  CO2 24 27 28   GLUCOSE 110* 172* 88  BUN 44* 46* 42*  CREATININE 2.76* 2.90* 2.70*  CALCIUM 8.3* 8.5* 8.6*   Recent Labs  Lab 07/24/18 0447 07/25/18 0546 07/26/18 0545  WBC 4.1 3.7* 3.6*  HGB 11.8* 10.9* 11.3*  HCT 36.9 33.7* 36.3  MCV 90.9 90.1 90.5  PLT 106* 123* 135*

## 2018-07-26 NOTE — Progress Notes (Signed)
   Unable to obtain torsemide due to cost.    She will need to continue bumex 3 mg twice a day.   We will attempt to get approval in the outpatient setting.   Sharonica Kraszewski NP-C  10:56 AM

## 2018-07-26 NOTE — Progress Notes (Addendum)
Patient ID: Kim Mullins, female   DOB: [birth year], 84 y.o.   MRN: [id number]     Advanced Heart Failure Rounding Note  PCP-Cardiologist: No primary care provider on file.   Subjective:    Yesterday IV lasix and milrinone stopped. Started on torsemide. Creatinine  Down form 2.9>2.7.   Feeling pretty good. Able walk in her room.   Echo: EF 35-40%, mild MR, mildly dilated RV, severe TR  RHC 07/24/18 RA mean 16 RV 42/16 PA 41/19, mean 29 PCWP mean 22  Oxygen saturations: PA 66% AO 99%  Cardiac Output (Fick) 4.87  Cardiac Index (Fick) 2.74 PVR 1.4 WU  Objective:   Weight Range: 73.2 kg Body mass index is 28.57 kg/m.   Vital Signs:   Temp:  [97.4 F (36.3 C)-98.6 F (37 C)] 97.4 F (36.3 C) (11/27 0451) Pulse Rate:  [61-64] 62 (11/27 0451) Resp:  [18-19] 18 (11/27 0451) BP: (82-110)/(54-80) 110/80 (11/27 0451) SpO2:  [96 %-100 %] 96 % (11/27 0451) Weight:  [73.2 kg] 73.2 kg (11/27 0451) Last BM Date: 07/20/18  Weight change: Filed Weights   07/24/18 0459 07/25/18 0216 07/26/18 0451  Weight: 74.3 kg 73.6 kg 73.2 kg    Intake/Output:   Intake/Output Summary (Last 24 hours) at 07/26/2018 0842 Last data filed at 07/26/2018 5885 Gross per 24 hour  Intake 484.24 ml  Output 400 ml  Net 84.24 ml    Physical Exam    General:  NAD.  No resp difficulty HEENT: normal Neck: supple. JVP 9-10 . Carotids 2+ bilat; no bruits. No lymphadenopathy or thryomegaly appreciated. Cor: PMI nondisplaced. Regular rate & rhythm. No rubs, gallops. 2/6 HSM LLSB Lungs: clear Abdomen: soft, nontender, nondistended. No hepatosplenomegaly. No bruits or masses. Good bowel sounds. Extremities: no cyanosis, clubbing, rash, trace deam. Ted hose on bilaterally.  Neuro: alert & orientedx3, cranial nerves grossly intact. moves all 4 extremities w/o difficulty. Affect pleasant   Telemetry   NSR with BiV pacing, personally reviewed.   EKG    No new tracings.    Labs     CBC Recent Labs    07/25/18 0546 07/26/18 0545  WBC 3.7* 3.6*  HGB 10.9* 11.3*  HCT 33.7* 36.3  MCV 90.1 90.5  PLT 123* 027*   Basic Metabolic Panel Recent Labs    07/25/18 1400 07/26/18 0545  NA 131* 134*  K 3.5 3.2*  CL 94* 96*  CO2 27 28  GLUCOSE 172* 88  BUN 46* 42*  CREATININE 2.90* 2.70*  CALCIUM 8.5* 8.6*   Liver Function Tests No results for input(s): AST, ALT, ALKPHOS, BILITOT, PROT, ALBUMIN in the last 72 hours. No results for input(s): LIPASE, AMYLASE in the last 72 hours. Cardiac Enzymes No results for input(s): CKTOTAL, CKMB, CKMBINDEX, TROPONINI in the last 72 hours.  BNP: BNP (last 3 results) Recent Labs    07/18/18 1721  BNP 1,897.3*    ProBNP (last 3 results) No results for input(s): PROBNP in the last 8760 hours.   D-Dimer No results for input(s): DDIMER in the last 72 hours. Hemoglobin A1C Recent Labs    07/25/18 0546  HGBA1C 5.3   Fasting Lipid Panel No results for input(s): CHOL, HDL, LDLCALC, TRIG, CHOLHDL, LDLDIRECT in the last 72 hours. Thyroid Function Tests No results for input(s): TSH, T4TOTAL, T3FREE, THYROIDAB in the last 72 hours.  Invalid input(s): FREET3  Other results:   Imaging    No results found.   Medications:     Scheduled Medications: . apixaban  2.5 mg Oral BID  . carvedilol  3.125 mg Oral BID WC  . latanoprost  1 drop Both Eyes QHS  . sodium chloride flush  3 mL Intravenous Q12H  . timolol  1 drop Both Eyes Daily  . torsemide  60 mg Oral BID    Infusions: . sodium chloride 250 mL (07/22/18 2058)    PRN Medications: sodium chloride, simethicone, sodium chloride flush    Patient Profile   Kim Mullins is a 84 year old with a history of chronic systolic heart failure, NICM, CHB, OSA, DMII TIA, and PAF.   Myocardial Amyloid Imaging --> 06/08/2018 strongly suggestive of TTR amyloidosis. Visual 2 or 3 with ratio >1.5. She has not started tafamidis.   Admitted with AKI. Creatinine up 3.6  on admit   Assessment/Plan   1. Acute on chronic systolic CHF: NICM, EF 49-17% with severe TR on echo this admission. + Cardiac Amyloid TTR.  Medtronic CRT-P. Device interrogation has shown minimal atrial fibrillation and 99% BiV pacing. Severe TR will make interpretation of JVD difficult.    [place] 07/24/18 with elevated filling pressures and severe TR. Good CO on milrinone. + PVH.  -  Yesterday milrinone and lasix stopped. She was started on torsemide but after talking to he she was switched to bumex due to cost.  - Stop torsemide and start bumex 3 mg twice a day.  2. AKI on CKD Stage III:  Creatinine continues to improve.   She will need follow up with nephrology.  3. Cardiac amyloidosis: PYP scan positive.  - Needs to start tafamidis depending on goals of care.  - She signed patient assistance for tafamidis.  4. CHB: Medtronic CRT-P.  - Needs EP follow up. Last seen by EP in 2017. No change.  5. DMII - Per primary.  6. PAF: Device interrogation showed intermittent  A fib.  -Remains in NSR.  - Continue eliquis at lower dose with age > 80 and Cr > 1.5.  7. Hypokalemia - Replace K.   HF follow up set for 08/10/18 at 11:30.   Medication concerns reviewed with patient and pharmacy team. Barriers identified: None at this time.   Length of Stay: [place], NP  07/26/2018, 8:42 AM  Advanced Heart Failure Team Pager [phone] (M-F; 7a - 4p)  Please contact [facility] for night-coverage after hours (4p -7a ) and weekends on [email or web address]  Patient seen with NP, agree with the above note.    She is stable today, now off milrinone.  Creatinine down to 2.7.  Feels about the same but able to walk around room without dyspnea.  She still has some volume overload but renal function has limited out ability to aggressively diurese her.  - I think she can go home today. Would prefer her to be on torsemide 60 mg bid but her insurance may not cover.  If she cannot get torsemide, will use  bumetanide 3 mg bid.   - She will need close followup in nephrology and CHF clinics.  - Will work on starting her on tafamidis for TTR amyloidosis.   [name] 07/26/2018 9:42 AM

## 2018-07-26 NOTE — Care Management Note (Signed)
Case Management Note  Patient Details  Name: Kim Mullins MRN: [id number] Date of Birth: 1930-03-14  Subjective/Objective:      AKI, CHF, hyponatremia, hypokalemia              Action/Plan: Pt active with [facility] for HHPT. [facility] will add [place] OT when services become available.   Expected Discharge Date:  07/26/18               Expected Discharge Plan:  [place]  In-House Referral:  NA  Discharge planning Services  CM Consult  Post Acute Care Choice:  Home Health, Resumption of Svcs/PTA Provider Choice offered to:  Patient  DME Arranged:  N/A DME Agency:  NA  HH Arranged:  PT, OT HH Agency:  [place]  Status of Service:  Completed, signed off  If discussed at [place] of Stay Meetings, dates discussed:    Additional Comments:  Erenest Rasher, RN 07/26/2018, 3:23 PM

## 2018-07-26 NOTE — Progress Notes (Signed)
CARDIAC REHAB PHASE I   PRE:  Rate/Rhythm: 60 Paced  BP:  Sitting: 85/58        SaO2: 99 RA  MODE:  Ambulation: 175 ft   POST:  Rate/Rhythm: 88 Paced  BP:  Sitting: 98/71        SaO2: 97 RA  1012 - 1119  Pt ambulated 175 ft with assistance x1, walker, and gait belt. Tolerated well. Gait was slow and steady. Pt was given HF booklet and educated on the contents of the book. Ed complete on nutrition (HH, DM, Low NA) and exercise guidelines. Pt voices understanding. Pt was referred to CRPII at [facility].  Philis Kendall, MS 07/26/2018 11:11 AM

## 2018-07-26 NOTE — Discharge Summary (Signed)
Physician Discharge Summary  Kim Mullins NTZ:[id number] DOB: 1929/10/02 DOA: 07/18/2018  PCP: Lin Landsman, MD  Admit date: 07/18/2018 Discharge date: 07/26/2018  Admitted From: Home Disposition: Home   Recommendations for Outpatient Follow-up:  1. Follow up with PCP in 1-2 weeks 2. Follow up with heart failure clinic as arranged by their clinic.  3. Follow up with nephrology, [facility], to be arranged by that clinic.  4. Please obtain BMP and CBC at follow up.  Home Health: PT, OT Equipment/Devices: None new Discharge Condition: Stable CODE STATUS: Full Diet recommendation: Heart healthy, renal  Brief/Interim Summary: Kim Mullins is an 84 year old female with history of chronic systolic CHF, nonischemic cardiomyopathy with myocardial amyloid imaging in October 2019 suggestive of transthyretin amyloidosis supposed to start on tafamidis, who was recently evaluated heart failure at the end of October for fluid overload and her bumex was increased at that time. She was admitted to the hospital from cardiology clinic now with worsening renal failure with her creatinine increasing to 3.7 from a baseline of 1.5-1.7. Cardiology was consulted and directed diuresis in concert with nephrology. RHC demonstrated R > L left failure. Milrinone started and the patient has had modest improvement in volume status. Creatinine seems to have stabilized with milrinone having been weaned and recommendation is for discharge with follow up with cardiology and nephrology.  Discharge Diagnoses:  Principal Problem:   AKI (acute kidney injury) ([place]) Active Problems:   Pulmonary HTN ([place])   Acute on chronic systolic CHF (congestive heart failure) (HCC)   Pacemaker   Atrial fibrillation (HCC)   Chronic anticoagulation  Acute on chronic systolic CHF, NICM, cardiac TTR amyloidosis, severe TR: Limited response to diuretics despite milrinone. Echo 07/21/2018 showed EF of 35 to 40%, with  PA peak pressure of 39 mmHg - Per HF team, transitioned to po torsemide which will require approval as outpatient. For now recommendation is to prescribe bumex 3mg  BID.  Stopped spironolactone. - Continue strict I/O, fluid restriction, daily weights - Plan to ultimately start tafamidis for amyloidosis.   AKI on stage III CKD: Baseline SCr presumed ~1.5, up to 3.7 on admission. Creatinine improved to 2.7 at discharge. - Nephrology following, will need outpatient follow up. - Avoid nephrotoxic medications as able.  Hypokalemia:  - Supplemented, discussed with HF team, will discharge on 61mEq daily.   Hyponatremia: Due to CHF and possibly impaired renal handling.  - Monitor with diuresis, fluid restriction. Improved, Na 134 at discharge.  CHB, PAF: Maintaining NSR. Intermittent AFib on device interrogation.  - Continue coreg, has PPM, continue low dose eliquis.   Hypertension:  - Medications as above, continue to monitor  Controlled T2DM: HbA1c 5.3%. No medications at discharge.  Discharge Instructions Discharge Instructions    (HEART FAILURE PATIENTS) Call MD:  Anytime you have any of the following symptoms: 1) 3 pound weight gain in 24 hours or 5 pounds in 1 week 2) shortness of breath, with or without a dry hacking cough 3) swelling in the hands, feet or stomach 4) if you have to sleep on extra pillows at night in order to breathe.   Complete by:  As directed    Amb Referral to Cardiac Rehabilitation   Complete by:  As directed    Diagnosis:  Heart Failure (see criteria below if ordering Phase II)   Heart Failure Type:  Chronic Systolic   Call MD for:  difficulty breathing, headache or visual disturbances   Complete by:  As directed  Call MD for:  extreme fatigue   Complete by:  As directed    Call MD for:  persistant dizziness or light-headedness   Complete by:  As directed    Call MD for:  persistant nausea and vomiting   Complete by:  As directed    Call MD for:   temperature >100.4   Complete by:  As directed    Diet - low sodium heart healthy   Complete by:  As directed    Discharge instructions   Complete by:  As directed    You were admitted with kidney failure which has improved. Your kidneys are still very impaired but stable and you have been cleared for discharge with the following recommendations:  - Take bumex 3mg  twice daily (up from prior dose) - Decrease coreg to 3.125mg  twice daily (down from 6.25mg  twice daily) - Take potassium 49mEq daily - Stop taking spironolactone - Decrease the dose of eliquis from 5mg  twice daily to 2.5mg  twice daily - Follow up with the heart failure clinic as scheduled on 12/12 and follow up with the nephrologist at [facility] in the next month. They will reach out to you with an appointment.   Increase activity slowly   Complete by:  As directed      Allergies as of 07/26/2018      Reactions   Hydrocodone Other (See Comments)   Took away all energy and made her stop eating food for short time   Percocet [oxycodone-acetaminophen] Other (See Comments)   Too away all energy and made her stop eating food for short time   Eggs Or Egg-derived Products Hives, Rash   Mercurial Derivatives Itching, Rash   Penicillins Hives, Rash   Has patient had a PCN reaction causing immediate rash, facial/tongue/throat swelling, SOB or lightheadedness with hypotension:  Has patient had a PCN reaction causing severe rash involving mucus membranes or skin necrosis:  Has patient had a PCN reaction that required hospitalization  Has patient had a PCN reaction occurring within the last 10 years:  If all of the above answers are "NO", then may proceed with Cephalosporin use.   Quinine Derivatives Hives, Rash      Medication List    STOP taking these medications   spironolactone 25 MG tablet Commonly known as:  ALDACTONE     TAKE these medications   apixaban 2.5 MG Tabs tablet Commonly known as:  ELIQUIS Take 1  tablet (2.5 mg total) by mouth 2 (two) times daily. What changed:    medication strength  how much to take   bumetanide 1 MG tablet Commonly known as:  BUMEX Take 3 tablets (3 mg total) by mouth 2 (two) times daily. What changed:    medication strength  See the new instructions.   carvedilol 3.125 MG tablet Commonly known as:  COREG Take 1 tablet (3.125 mg total) by mouth 2 (two) times daily with a meal. What changed:    medication strength  how much to take  how to take this  when to take this  additional instructions   diclofenac sodium 1 % Gel Commonly known as:  VOLTAREN Apply 2 g topically 3 (three) times daily as needed (pain, inflammation).   glucose blood test strip Use to test blood sugar twice daily ([employer] Smartview)   latanoprost 0.005 % ophthalmic solution Commonly known as:  XALATAN Place 1 drop into both eyes at bedtime.   LAXATIVE PO Take 1 tablet by mouth daily as needed (for constipation).  potassium chloride SA 20 MEQ tablet Commonly known as:  K-DUR,KLOR-CON Take 1 tablet (20 mEq total) by mouth daily.   timolol 0.5 % ophthalmic solution Commonly known as:  TIMOPTIC Place 1 drop into both eyes daily.   TYLENOL 325 MG Caps Generic drug:  Acetaminophen Take 650 mg by mouth daily as needed (for shoulder pain).      Follow-up Information    [facility] Follow up in 1 month(s).   Why:  We will call with appointment time and date Contact information: [place] [address] [phone]        Lin Landsman, MD. Schedule an appointment as soon as possible for a visit in 1 week(s).   Specialty:  Family Medicine Contact information: [place] [address] [place] Follow up on 08/10/2018.   Specialty:  Cardiology Contact information: [address] [id number] [place] [phone]          Allergies  Allergen Reactions  . Hydrocodone Other (See Comments)    Took away all energy and made her stop eating food for short time  . Percocet [Oxycodone-Acetaminophen] Other (See Comments)    Too away all energy and made her stop eating food for short time  . Eggs Or Egg-Derived Products Hives and Rash  . Mercurial Derivatives Itching and Rash  . Penicillins Hives and Rash    Has patient had a PCN reaction causing immediate rash, facial/tongue/throat swelling, SOB or lightheadedness with hypotension:  Has patient had a PCN reaction causing severe rash involving mucus membranes or skin necrosis:  Has patient had a PCN reaction that required hospitalization  Has patient had a PCN reaction occurring within the last 10 years:  If all of the above answers are "NO", then may proceed with Cephalosporin use.  . Quinine Derivatives Hives and Rash    Consultations:  Heart failure team  Nephrology  Procedures/Studies: Dg Chest 2 View  Result Date: 07/18/2018 CLINICAL DATA:  Chronic shortness of breath. History of asthma and CHF. EXAM: CHEST - 2 VIEW COMPARISON:  Chest radiograph June 25, 2017 FINDINGS: Stable cardiomegaly. Mildly calcified aortic arch. No pleural effusion or focal consolidation. No pneumothorax. 3 lead LEFT cardiac pacemaker in situ. Severe LEFT glenohumeral osteoarthrosis. RIGHT shoulder arthroplasty. IMPRESSION: Stable cardiomegaly.  No acute pulmonary process. Aortic Atherosclerosis (ICD10-I70.0). Electronically Signed   By: Elon Alas M.D.   On: 07/18/2018 20:14    RHC 07/24/2018: RHC Procedural Findings: Hemodynamics (mmHg) RA mean 16 RV 42/16 PA 41/19, mean 29 PCWP mean 22 Oxygen saturations: PA 66% AO 99% Cardiac Output (Fick) 4.87  Cardiac Index (Fick) 2.74 PVR 1.4 WU  Subjective: Feels great, wants to go home. No dyspnea, some leg swelling which is stable. No new complaints.  Discharge Exam: Vitals:   07/25/18 2316 07/26/18 0451  BP:   110/80  Pulse: 64 62  Resp: 18 18  Temp:  (!) 97.4 F (36.3 C)  SpO2: 98% 96%   General: Pt is alert, awake, not in acute distress Neck: Supple, +distended neck veins Cardiovascular: RRR, U2/V2 +, II/VI systolic murmur at LLSB Respiratory: Nonlabored on room air. Clear. Abdominal: Soft, NT, ND, bowel sounds + Extremities: 1+ dependent edema, no cyanosis  Labs: BNP (last 3 results) Recent Labs    07/18/18 1721  BNP 5,366.4*   Basic Metabolic Panel: Recent Labs  Lab 07/23/18 0408 07/24/18 0447 07/25/18 0546  07/25/18 1400 07/26/18 0545  NA 133* 133* 132* 131* 134*  K 3.2* 4.1 2.7* 3.5 3.2*  CL 99 97* 96* 94* 96*  CO2 25 24 24 27 28   GLUCOSE 108* 83 110* 172* 88  BUN 44* 47* 44* 46* 42*  CREATININE 2.94* 2.99* 2.76* 2.90* 2.70*  CALCIUM 8.5* 8.5* 8.3* 8.5* 8.6*   Liver Function Tests: No results for input(s): AST, ALT, ALKPHOS, BILITOT, PROT, ALBUMIN in the last 168 hours. No results for input(s): LIPASE, AMYLASE in the last 168 hours. No results for input(s): AMMONIA in the last 168 hours. CBC: Recent Labs  Lab 07/22/18 0606 07/23/18 0408 07/24/18 0447 07/25/18 0546 07/26/18 0545  WBC 3.7* 3.9* 4.1 3.7* 3.6*  HGB 10.9* 10.8* 11.8* 10.9* 11.3*  HCT 34.2* 34.0* 36.9 33.7* 36.3  MCV 91.2 90.9 90.9 90.1 90.5  PLT 108* 122* 106* 123* 135*   Cardiac Enzymes: No results for input(s): CKTOTAL, CKMB, CKMBINDEX, TROPONINI in the last 168 hours. BNP: Invalid input(s): POCBNP CBG: Recent Labs  Lab 07/24/18 1638 07/24/18 2143 07/25/18 0822 07/25/18 1130 07/25/18 1647  GLUCAP 138* 160* 84 143* 141*   D-Dimer No results for input(s): DDIMER in the last 72 hours. Hgb A1c Recent Labs    07/25/18 0546  HGBA1C 5.3   Lipid Profile No results for input(s): CHOL, HDL, LDLCALC, TRIG, CHOLHDL, LDLDIRECT in the last 72 hours. Thyroid function studies No results for input(s): TSH, T4TOTAL, T3FREE, THYROIDAB in the last 72 hours.  Invalid input(s): FREET3 Anemia  work up No results for input(s): VITAMINB12, FOLATE, FERRITIN, TIBC, IRON, RETICCTPCT in the last 72 hours. Urinalysis    Component Value Date/Time   COLORURINE YELLOW 06/24/2018 2314   APPEARANCEUR HAZY (A) 06/24/2018 2314   LABSPEC 1.013 06/24/2018 2314   PHURINE 6.0 06/24/2018 2314   GLUCOSEU NEGATIVE 06/24/2018 2314   GLUCOSEU NEGATIVE 07/10/2014 1517   HGBUR LARGE (A) 06/24/2018 2314   BILIRUBINUR NEGATIVE 06/24/2018 [place] 06/24/2018 2314   PROTEINUR NEGATIVE 06/24/2018 2314   UROBILINOGEN 0.2 10/06/2014 1552   NITRITE NEGATIVE 06/24/2018 2314   LEUKOCYTESUR MODERATE (A) 06/24/2018 2314    Microbiology No results found for this or any previous visit (from the past 240 hour(s)).  Time coordinating discharge: Approximately 40 minutes  Patrecia Pour, MD  [facility] 07/26/2018, 12:04 PM Pager [phone]

## 2018-08-02 ENCOUNTER — Telehealth (HOSPITAL_COMMUNITY): Payer: Self-pay | Admitting: Pharmacist

## 2018-08-02 NOTE — Telephone Encounter (Signed)
Torsemide PA approved by [place] Part D through 08/01/19.   Ruta Hinds. Velva Harman, PharmD, BCPS, CPP Clinical Pharmacist Phone: [phone] 08/02/2018 8:51 AM

## 2018-08-03 ENCOUNTER — Telehealth (HOSPITAL_COMMUNITY): Payer: Self-pay

## 2018-08-03 NOTE — Telephone Encounter (Signed)
Called patient to see if she is interested in the Cardiac Rehab Program. Patient expressed interest. Explained scheduling process and went over insurance, patient verbalized understanding. Will contact patient for scheduling once f/u has been completed. 

## 2018-08-03 NOTE — Telephone Encounter (Signed)
Pt insurance is active and benefits verified through [place]. Co-pay $0.00, DED $0.00/$0.00 met, out of pocket $5,800.00/$3,239.91 met, co-insurance 20%. No pre-authorization required. Passport, 08/03/18 @ 10:51AM, REF# [id number]  Will contact patient to see if she is interested in the Cardiac Rehab Program. If interested, patient will need to complete follow up appt. Once completed, patient will be contacted for scheduling upon review by the RN Navigator.

## 2018-08-10 ENCOUNTER — Ambulatory Visit (HOSPITAL_COMMUNITY)
Admission: RE | Admit: 2018-08-10 | Discharge: 2018-08-10 | Disposition: A | Discharge status: Home / Self Care | Payer: Medicare Other | Source: Ambulatory Visit | Attending: Cardiology | Admitting: Cardiology

## 2018-08-10 VITALS — BP 100/80 | HR 63 | Wt 152.2 lb

## 2018-08-10 DIAGNOSIS — G4733 Obstructive sleep apnea (adult) (pediatric): Secondary | ICD-10-CM | POA: Diagnosis not present

## 2018-08-10 DIAGNOSIS — E1122 Type 2 diabetes mellitus with diabetic chronic kidney disease: Secondary | ICD-10-CM | POA: Diagnosis not present

## 2018-08-10 DIAGNOSIS — Z79899 Other long term (current) drug therapy: Secondary | ICD-10-CM | POA: Diagnosis not present

## 2018-08-10 DIAGNOSIS — K219 Gastro-esophageal reflux disease without esophagitis: Secondary | ICD-10-CM | POA: Diagnosis not present

## 2018-08-10 DIAGNOSIS — I442 Atrioventricular block, complete: Secondary | ICD-10-CM | POA: Insufficient documentation

## 2018-08-10 DIAGNOSIS — Z950 Presence of cardiac pacemaker: Secondary | ICD-10-CM | POA: Insufficient documentation

## 2018-08-10 DIAGNOSIS — N183 Chronic kidney disease, stage 3 (moderate): Secondary | ICD-10-CM | POA: Diagnosis not present

## 2018-08-10 DIAGNOSIS — I428 Other cardiomyopathies: Secondary | ICD-10-CM | POA: Insufficient documentation

## 2018-08-10 DIAGNOSIS — I43 Cardiomyopathy in diseases classified elsewhere: Secondary | ICD-10-CM | POA: Diagnosis not present

## 2018-08-10 DIAGNOSIS — E876 Hypokalemia: Secondary | ICD-10-CM | POA: Diagnosis not present

## 2018-08-10 DIAGNOSIS — N179 Acute kidney failure, unspecified: Secondary | ICD-10-CM | POA: Insufficient documentation

## 2018-08-10 DIAGNOSIS — Z87891 Personal history of nicotine dependence: Secondary | ICD-10-CM | POA: Insufficient documentation

## 2018-08-10 DIAGNOSIS — Z7901 Long term (current) use of anticoagulants: Secondary | ICD-10-CM | POA: Diagnosis not present

## 2018-08-10 DIAGNOSIS — I5022 Chronic systolic (congestive) heart failure: Secondary | ICD-10-CM | POA: Diagnosis present

## 2018-08-10 DIAGNOSIS — I480 Paroxysmal atrial fibrillation: Secondary | ICD-10-CM

## 2018-08-10 DIAGNOSIS — J45909 Unspecified asthma, uncomplicated: Secondary | ICD-10-CM | POA: Insufficient documentation

## 2018-08-10 DIAGNOSIS — E854 Organ-limited amyloidosis: Secondary | ICD-10-CM | POA: Diagnosis not present

## 2018-08-10 DIAGNOSIS — Z8673 Personal history of transient ischemic attack (TIA), and cerebral infarction without residual deficits: Secondary | ICD-10-CM | POA: Insufficient documentation

## 2018-08-10 LAB — BASIC METABOLIC PANEL
Anion gap: 17 — ABNORMAL HIGH (ref 5–15)
BUN: 35 mg/dL — ABNORMAL HIGH (ref 8–23)
CO2: 24 mmol/L (ref 22–32)
CREATININE: 2.08 mg/dL — AB (ref 0.44–1.00)
Calcium: 9.1 mg/dL (ref 8.9–10.3)
Chloride: 99 mmol/L (ref 98–111)
GFR calc non Af Amer: 21 mL/min — ABNORMAL LOW (ref >60)
GFR, EST AFRICAN AMERICAN: 25 mL/min — AB (ref >60)
GLUCOSE: 146 mg/dL — AB (ref 70–99)
Potassium: 3.2 mmol/L — ABNORMAL LOW (ref 3.5–5.1)
Sodium: 140 mmol/L (ref 135–145)

## 2018-08-10 NOTE — Patient Instructions (Signed)
Labs today We will only contact you if something comes back abnormal or we need to make some changes. Otherwise no news is good news!  A referral has been placed for Electrophysiology.  They will contact you to schedule your follow up.  Please call in January to update [place] with your insurance information.   Your physician recommends that you schedule a follow-up appointment in: 4-6 weeks.

## 2018-08-10 NOTE — Progress Notes (Signed)
Advanced Heart Failure Clinic Note   Patient ID: Kim Mullins, female   DOB: [birth year], 84 y.o.   MRN: [id number] PCP: Dr. Ayesha Rumpf Cardiology: Dr. Radford Pax HF Cardiology: Dr. Nickolas Madrid Kim Mullins is a 84 y.o. female with history of chronic systolic CHF/nonischemic cardiomyopathy, prior complete heart block, OSA, and paroxysmal atrial fibrillation presents for CHF clinic evaluation.  She was diagnosed with a cardiomyopathy back in 2015.  She had right and left heart cath in 12/15, showing no significant coronary disease.  EF was in the 30-35% range.  She was admitted in 2/16 with symptomatic bradycardia/complete heart block and had Medtronic CRT-P placed.  In 4/16, EF was back up to 50-55%.  However, repeat echo in 6/17 showed EF down to 15-20% with RV dysfunction.    She was admitted in 8/17 with suspected TIA => dysarthria and blurred vision.  She had been on Eliquis 2.5 mg bid, this was increased to 5 mg bid.  Lisinopril was stopped and Coreg was decreased due to soft BP.  Echo was done that admission, showing improvement in EF to 40-45%.    Echo in 5/19 showed EF 40%, moderate LVH, moderate diastolic dysfunction, mild to moderately decreased RV systolic function, dilated IVC.   Admitted 11/19 - 07/26/18 with worsening renal failure with Cr up to 3.7 from baseline 1.5 - 1.7. RHC demonstrated R>L failure as below. Transiently on milrinone, but weaned off prior to discharge. Cr remained elevated but stable at 2.7 on day of discharge.  She presents today for post hospital follow up. She is feeling OK since her recent admission. Her main complaint remains fatigue. She states it takes her much longer to get things done, but she is coming to terms with her age. It takes her several days to do the laundry, one step at a time. Remains SOB walking between rooms, and occasionally changing clothes. Grandaughter, whom she raised, helps with her appointments and medications. Taking all her  medications as directed.   PMH: 1. Type II diabetes: Diet-controlled.  2. GERD: s/p esophageal dilatation.  3. Chronic systolic CHF: 1st noted in 2015. Nonischemic cardiomyopathy.  - Echo (11/15) with EF 40-45%.  - LHC/RHC (12/15): Normal coronaries, EF 30-35%, mean RA 9, PA 44/16 mean 31, unable to obtain PCWP, CI 2.5.  - Developed CHB and had Medtronic CRT-P placed in 2/16. - Echo (4/16) with EF 50-55%.  - Echo (6/17): EF 15-20%, moderate LVh, restrictive diastolic function, mild MR, RV moderately dilated and moderately decreased in function, moderate TR, PASP 46 mmHg.  - Echo (8/17): EF 40-45%, diffuse hypokinesis, mild MR.  - Echo (5/19): EF 40%, moderate LVH, moderate diastolic dysfunction, mild to moderately decreased RV systolic function, dilated IVC. 4. Complete heart block: Medtronic CRT-P in 2/16.  5. OSA: Uses CPAP 6. Asthma 7. Atrial fibrillation: Paroxysmal.  8. H/o CVA.  Possible TIA in 8/17.    - Carotid dopplers (8/17) with 1-39% BICA stenosis.   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   FH: No cardiac problems that she knows of.   Review of systems complete and found to be negative unless listed in HPI.    Current Outpatient Medications  Medication Sig Dispense Refill  . Acetaminophen (TYLENOL) 325 MG CAPS Take 650 mg by mouth daily as needed (for shoulder pain).     Marland Kitchen apixaban (ELIQUIS) 2.5 MG TABS tablet Take 1 tablet (2.5 mg total)  by mouth 2 (two) times daily. 60 tablet 0  . Bisacodyl (LAXATIVE PO) Take 1 tablet by mouth daily as needed (for constipation).     . bumetanide (BUMEX) 1 MG tablet Take 3 tablets (3 mg total) by mouth 2 (two) times daily. 180 tablet 0  . carvedilol (COREG) 3.125 MG tablet Take 1 tablet (3.125 mg total) by mouth 2 (two) times daily with a meal. 60 tablet 0  . diclofenac sodium (VOLTAREN) 1 % GEL Apply 2 g topically 3 (three) times daily as needed (pain,  inflammation). 100 g 0  . glucose blood test strip Use to test blood sugar twice daily (Accuchek Smartview) 100 each 0  . latanoprost (XALATAN) 0.005 % ophthalmic solution Place 1 drop into both eyes at bedtime.   11  . potassium chloride SA (K-DUR,KLOR-CON) 20 MEQ tablet Take 1 tablet (20 mEq total) by mouth daily. 30 tablet 0  . timolol (TIMOPTIC) 0.5 % ophthalmic solution Place 1 drop into both eyes daily.   11   No current facility-administered medications for this encounter.    Vitals:   08/10/18 1156  BP: 100/80  Pulse: 63  SpO2: 99%  Weight: 69 kg (152 lb 3.2 oz)    Wt Readings from Last 3 Encounters:  08/10/18 69 kg (152 lb 3.2 oz)  07/26/18 73.2 kg (161 lb 4.8 oz)  07/18/18 74.5 kg (164 lb 3.2 oz)    Physical Exam General: Elderly appearing. NAD.  HEENT: Normal Neck: Supple. JVP 6-7 cm. Carotids 2+ bilat; no bruits. No thyromegaly or nodule noted. Cor: PMI nondisplaced. RRR, No M/G/R noted Lungs: CTAB, normal effort. Abdomen: Soft, non-tender, non-distended, no HSM. No bruits or masses. +BS  Extremities: No cyanosis, clubbing, or rash. R and LLE no edema.  Neuro: Alert & orientedx3, cranial nerves grossly intact. moves all 4 extremities w/o difficulty. Affect pleasant   Assessment/Plan: 1.Chronic systolic CHF: NICM, EF 14-78% with severe TR on echo this admission. + Cardiac Amyloid TTR.  Medtronic CRT-P. Device interrogation has shown minimal atrial fibrillation and 99% BiV pacing. Severe TR will make interpretation of JVD difficult.    [place] 07/24/18 with elevated filling pressures and severe TR. Good CO on milrinone. + PVH.  - Required milrinone during recent admission.  - She was started on torsemide but after talking to pharmacy she was switched to bumex due to cost.  - Continue bumex 3 mg twice a day. Will have our pharm-D inquire into torsemide pricing and possibility of switching.  - Reinforced fluid restriction to < 2 L daily, sodium restriction to less than 2000  mg daily, and the importance of daily weights.   2. AKI on CKD Stage III:  - BMET today.  - Will need renal follow up. She sees them next week, on her birthday.  3. Cardiac amyloidosis: PYP scan positive.  - Awaiting tafamidis, after some paperwork in January once her insurance flips over.  - Genetic testing today.  4. CHB: Medtronic CRT-P.  - Needs EP follow up. Last seen by EP in 2017. No change.  5. DMII - Per PCP.  6. PAF: Device interrogation showed intermittent A fib.  - Regular on exam.  - Continue eliquis at lower dose with age > 35 and Cr > 1.5.  7. Hypokalemia - BMET today.   Doing well overall after recent admit. Volume status stable. BMET today. RTC 4-6 weeks. Sooner with symptoms.   Shirley Friar, PA-C  08/10/2018   Greater than 50% of the 25  minute visit was spent in counseling/coordination of care regarding disease state education, salt/fluid restriction, sliding scale diuretics, and medication compliance.

## 2018-08-11 ENCOUNTER — Encounter (HOSPITAL_COMMUNITY): Payer: Self-pay

## 2018-08-11 NOTE — Addendum Note (Signed)
Encounter addended by: Jorge Ny, LCSW on: 08/11/2018 8:40 AM  Actions taken: Visit Navigator Flowsheet section accepted

## 2018-08-14 ENCOUNTER — Telehealth (HOSPITAL_COMMUNITY): Payer: Self-pay | Admitting: Cardiology

## 2018-08-14 DIAGNOSIS — I509 Heart failure, unspecified: Secondary | ICD-10-CM

## 2018-08-14 MED ORDER — POTASSIUM CHLORIDE CRYS ER 20 MEQ PO TBCR: 20.0000 meq | EXTENDED_RELEASE_TABLET | Freq: Two times a day (BID) | ORAL | 3 refills | Status: DC

## 2018-08-14 NOTE — Telephone Encounter (Signed)
-----   Message from Shirley Friar, PA-C sent at 08/11/2018  7:43 AM EST ----- Please call today.

## 2018-08-14 NOTE — Telephone Encounter (Signed)
Notes recorded by Kerry Dory, CMA on 08/14/2018 at 5:01 PM EST Patient aware. Patient voiced understanding repeat labs 12/24   ------  Notes recorded by Kerry Dory, CMA on 08/11/2018 at 1:33 PM EST Left message for patient to call back.  [phone] (M) ------  Notes recorded by Shirley Friar, PA-C on 08/11/2018 at 7:43 AM EST Please call today. ------  Notes recorded by Shirley Friar, PA-C on 08/10/2018 at 1:39 PM EST Needs to take an extra 60 meq of K today *(total of 80)* and then increase daily K to 20 meq BID. Needs repeat BMET 7-10 days.    Legrand Como [address] [place], PA-C 08/10/2018 1:38 PM

## 2018-08-17 ENCOUNTER — Emergency Department (HOSPITAL_COMMUNITY): Payer: Medicare Other

## 2018-08-17 ENCOUNTER — Observation Stay (HOSPITAL_COMMUNITY): Payer: Medicare Other

## 2018-08-17 ENCOUNTER — Inpatient Hospital Stay (HOSPITAL_COMMUNITY)
Admission: EM | Admit: 2018-08-17 | Discharge: 2018-08-20 | DRG: 291 | Disposition: A | Discharge status: Home Health | Payer: Medicare Other | Attending: Internal Medicine | Admitting: Internal Medicine

## 2018-08-17 ENCOUNTER — Encounter (HOSPITAL_COMMUNITY): Payer: Self-pay

## 2018-08-17 ENCOUNTER — Other Ambulatory Visit: Payer: Self-pay

## 2018-08-17 DIAGNOSIS — Z7189 Other specified counseling: Secondary | ICD-10-CM

## 2018-08-17 DIAGNOSIS — R7989 Other specified abnormal findings of blood chemistry: Secondary | ICD-10-CM

## 2018-08-17 DIAGNOSIS — H409 Unspecified glaucoma: Secondary | ICD-10-CM | POA: Diagnosis present

## 2018-08-17 DIAGNOSIS — Z96641 Presence of right artificial hip joint: Secondary | ICD-10-CM | POA: Diagnosis present

## 2018-08-17 DIAGNOSIS — I4891 Unspecified atrial fibrillation: Secondary | ICD-10-CM | POA: Diagnosis present

## 2018-08-17 DIAGNOSIS — I5043 Acute on chronic combined systolic (congestive) and diastolic (congestive) heart failure: Secondary | ICD-10-CM | POA: Diagnosis not present

## 2018-08-17 DIAGNOSIS — J9690 Respiratory failure, unspecified, unspecified whether with hypoxia or hypercapnia: Secondary | ICD-10-CM

## 2018-08-17 DIAGNOSIS — J45909 Unspecified asthma, uncomplicated: Secondary | ICD-10-CM | POA: Diagnosis present

## 2018-08-17 DIAGNOSIS — I480 Paroxysmal atrial fibrillation: Secondary | ICD-10-CM | POA: Diagnosis present

## 2018-08-17 DIAGNOSIS — N184 Chronic kidney disease, stage 4 (severe): Secondary | ICD-10-CM | POA: Diagnosis present

## 2018-08-17 DIAGNOSIS — I5023 Acute on chronic systolic (congestive) heart failure: Secondary | ICD-10-CM

## 2018-08-17 DIAGNOSIS — I10 Essential (primary) hypertension: Secondary | ICD-10-CM | POA: Diagnosis present

## 2018-08-17 DIAGNOSIS — E1122 Type 2 diabetes mellitus with diabetic chronic kidney disease: Secondary | ICD-10-CM | POA: Diagnosis present

## 2018-08-17 DIAGNOSIS — E854 Organ-limited amyloidosis: Secondary | ICD-10-CM | POA: Diagnosis present

## 2018-08-17 DIAGNOSIS — I509 Heart failure, unspecified: Secondary | ICD-10-CM

## 2018-08-17 DIAGNOSIS — K219 Gastro-esophageal reflux disease without esophagitis: Secondary | ICD-10-CM | POA: Diagnosis present

## 2018-08-17 DIAGNOSIS — Z8249 Family history of ischemic heart disease and other diseases of the circulatory system: Secondary | ICD-10-CM

## 2018-08-17 DIAGNOSIS — Z515 Encounter for palliative care: Secondary | ICD-10-CM

## 2018-08-17 DIAGNOSIS — I482 Chronic atrial fibrillation, unspecified: Secondary | ICD-10-CM | POA: Diagnosis present

## 2018-08-17 DIAGNOSIS — N185 Chronic kidney disease, stage 5: Secondary | ICD-10-CM

## 2018-08-17 DIAGNOSIS — I130 Hypertensive heart and chronic kidney disease with heart failure and stage 1 through stage 4 chronic kidney disease, or unspecified chronic kidney disease: Secondary | ICD-10-CM | POA: Diagnosis not present

## 2018-08-17 DIAGNOSIS — I248 Other forms of acute ischemic heart disease: Secondary | ICD-10-CM | POA: Diagnosis present

## 2018-08-17 DIAGNOSIS — Z7901 Long term (current) use of anticoagulants: Secondary | ICD-10-CM

## 2018-08-17 DIAGNOSIS — Z90710 Acquired absence of both cervix and uterus: Secondary | ICD-10-CM

## 2018-08-17 DIAGNOSIS — G4733 Obstructive sleep apnea (adult) (pediatric): Secondary | ICD-10-CM | POA: Diagnosis present

## 2018-08-17 DIAGNOSIS — Z79899 Other long term (current) drug therapy: Secondary | ICD-10-CM

## 2018-08-17 DIAGNOSIS — Z87891 Personal history of nicotine dependence: Secondary | ICD-10-CM

## 2018-08-17 DIAGNOSIS — I272 Pulmonary hypertension, unspecified: Secondary | ICD-10-CM | POA: Diagnosis present

## 2018-08-17 DIAGNOSIS — Z96611 Presence of right artificial shoulder joint: Secondary | ICD-10-CM | POA: Diagnosis present

## 2018-08-17 DIAGNOSIS — K760 Fatty (change of) liver, not elsewhere classified: Secondary | ICD-10-CM | POA: Diagnosis present

## 2018-08-17 DIAGNOSIS — R778 Other specified abnormalities of plasma proteins: Secondary | ICD-10-CM | POA: Diagnosis present

## 2018-08-17 DIAGNOSIS — I420 Dilated cardiomyopathy: Secondary | ICD-10-CM | POA: Diagnosis present

## 2018-08-17 DIAGNOSIS — I5082 Biventricular heart failure: Secondary | ICD-10-CM | POA: Diagnosis present

## 2018-08-17 DIAGNOSIS — I472 Ventricular tachycardia: Secondary | ICD-10-CM | POA: Diagnosis present

## 2018-08-17 DIAGNOSIS — J9601 Acute respiratory failure with hypoxia: Secondary | ICD-10-CM | POA: Diagnosis present

## 2018-08-17 DIAGNOSIS — E859 Amyloidosis, unspecified: Secondary | ICD-10-CM

## 2018-08-17 DIAGNOSIS — I43 Cardiomyopathy in diseases classified elsewhere: Secondary | ICD-10-CM | POA: Diagnosis present

## 2018-08-17 DIAGNOSIS — Z8673 Personal history of transient ischemic attack (TIA), and cerebral infarction without residual deficits: Secondary | ICD-10-CM

## 2018-08-17 DIAGNOSIS — N189 Chronic kidney disease, unspecified: Secondary | ICD-10-CM

## 2018-08-17 DIAGNOSIS — E876 Hypokalemia: Secondary | ICD-10-CM | POA: Diagnosis not present

## 2018-08-17 DIAGNOSIS — Z950 Presence of cardiac pacemaker: Secondary | ICD-10-CM

## 2018-08-17 DIAGNOSIS — Z833 Family history of diabetes mellitus: Secondary | ICD-10-CM

## 2018-08-17 DIAGNOSIS — R0602 Shortness of breath: Secondary | ICD-10-CM | POA: Diagnosis not present

## 2018-08-17 HISTORY — DX: Dyspnea, unspecified: R06.00

## 2018-08-17 HISTORY — DX: Unspecified glaucoma: H40.9

## 2018-08-17 LAB — CBC WITH DIFFERENTIAL/PLATELET
Abs Immature Granulocytes: 0.02 10*3/uL (ref 0.00–0.07)
BASOS ABS: 0.1 10*3/uL (ref 0.0–0.1)
BASOS PCT: 1 %
EOS PCT: 3 %
Eosinophils Absolute: 0.1 10*3/uL (ref 0.0–0.5)
HEMATOCRIT: 42.8 % (ref 36.0–46.0)
Hemoglobin: 13.4 g/dL (ref 12.0–15.0)
IMMATURE GRANULOCYTES: 1 %
Lymphocytes Relative: 34 %
Lymphs Abs: 1.2 10*3/uL (ref 0.7–4.0)
MCH: 28.5 pg (ref 26.0–34.0)
MCHC: 31.3 g/dL (ref 30.0–36.0)
MCV: 91.1 fL (ref 80.0–100.0)
MONOS PCT: 9 %
Monocytes Absolute: 0.3 10*3/uL (ref 0.1–1.0)
NEUTROS PCT: 52 %
NRBC: 0 % (ref 0.0–0.2)
Neutro Abs: 1.9 10*3/uL (ref 1.7–7.7)
PLATELETS: 138 10*3/uL — AB (ref 150–400)
RBC: 4.7 MIL/uL (ref 3.87–5.11)
RDW: 17.1 % — AB (ref 11.5–15.5)
WBC: 3.5 10*3/uL — ABNORMAL LOW (ref 4.0–10.5)

## 2018-08-17 LAB — I-STAT TROPONIN, ED: TROPONIN I, POC: 0.29 ng/mL — AB (ref 0.00–0.08)

## 2018-08-17 LAB — COMPREHENSIVE METABOLIC PANEL
ALT: 13 U/L (ref 0–44)
ANION GAP: 14 (ref 5–15)
AST: 31 U/L (ref 15–41)
Albumin: 3.9 g/dL (ref 3.5–5.0)
Alkaline Phosphatase: 83 U/L (ref 38–126)
BILIRUBIN TOTAL: 3.4 mg/dL — AB (ref 0.3–1.2)
BUN: 27 mg/dL — ABNORMAL HIGH (ref 8–23)
CHLORIDE: 101 mmol/L (ref 98–111)
CO2: 21 mmol/L — ABNORMAL LOW (ref 22–32)
Calcium: 9.2 mg/dL (ref 8.9–10.3)
Creatinine, Ser: 1.86 mg/dL — ABNORMAL HIGH (ref 0.44–1.00)
GFR calc Af Amer: 28 mL/min — ABNORMAL LOW (ref >60)
GFR, EST NON AFRICAN AMERICAN: 24 mL/min — AB (ref >60)
Glucose, Bld: 105 mg/dL — ABNORMAL HIGH (ref 70–99)
Potassium: 4.6 mmol/L (ref 3.5–5.1)
Sodium: 136 mmol/L (ref 135–145)
TOTAL PROTEIN: 7.2 g/dL (ref 6.5–8.1)

## 2018-08-17 LAB — PROTIME-INR
INR: 1.71
Prothrombin Time: 19.9 seconds — ABNORMAL HIGH (ref 11.4–15.2)

## 2018-08-17 LAB — GLUCOSE, CAPILLARY: Glucose-Capillary: 162 mg/dL — ABNORMAL HIGH (ref 70–99)

## 2018-08-17 LAB — D-DIMER, QUANTITATIVE (NOT AT ARMC): D DIMER QUANT: 1.94 ug{FEU}/mL — AB (ref 0.00–0.50)

## 2018-08-17 LAB — BRAIN NATRIURETIC PEPTIDE: B NATRIURETIC PEPTIDE 5: 2006 pg/mL — AB (ref 0.0–100.0)

## 2018-08-17 LAB — APTT: APTT: 39 s — AB (ref 24–36)

## 2018-08-17 LAB — TROPONIN I: Troponin I: 0.29 ng/mL (ref <0.03)

## 2018-08-17 MED ORDER — TECHNETIUM TO 99M ALBUMIN AGGREGATED
4.1900 | Freq: Once | INTRAVENOUS | Status: AC | PRN
  Administered 2018-08-17: 4.19 via INTRAVENOUS

## 2018-08-17 MED ORDER — ACETAMINOPHEN 325 MG PO TABS: 650.0000 mg | ORAL_TABLET | Freq: Four times a day (QID) | ORAL | Status: DC | PRN

## 2018-08-17 MED ORDER — FUROSEMIDE 10 MG/ML IJ SOLN
80.0000 mg | Freq: Once | INTRAMUSCULAR | Status: AC
  Administered 2018-08-17: 80 mg via INTRAVENOUS
  Filled 2018-08-17: qty 8

## 2018-08-17 MED ORDER — INSULIN ASPART 100 UNIT/ML ~~LOC~~ SOLN
0.0000 [IU] | Freq: Three times a day (TID) | SUBCUTANEOUS | Status: DC
  Administered 2018-08-18 – 2018-08-19 (×2): 1 [IU] via SUBCUTANEOUS

## 2018-08-17 MED ORDER — TECHNETIUM TC 99M DIETHYLENETRIAME-PENTAACETIC ACID
31.2000 | Freq: Once | INTRAVENOUS | Status: AC | PRN
  Administered 2018-08-17: 31.2 via RESPIRATORY_TRACT

## 2018-08-17 MED ORDER — APIXABAN 2.5 MG PO TABS
2.5000 mg | ORAL_TABLET | Freq: Two times a day (BID) | ORAL | Status: DC
  Administered 2018-08-17 – 2018-08-20 (×6): 2.5 mg via ORAL
  Filled 2018-08-17 (×6): qty 1

## 2018-08-17 MED ORDER — CYPROHEPTADINE HCL 4 MG PO TABS
4.0000 mg | ORAL_TABLET | Freq: Every day | ORAL | Status: DC
  Administered 2018-08-17 – 2018-08-19 (×3): 4 mg via ORAL
  Filled 2018-08-17 (×3): qty 1

## 2018-08-17 MED ORDER — ACETAMINOPHEN 650 MG RE SUPP: 650.0000 mg | Freq: Four times a day (QID) | RECTAL | Status: DC | PRN

## 2018-08-17 MED ORDER — PANTOPRAZOLE SODIUM 40 MG PO TBEC
40.0000 mg | DELAYED_RELEASE_TABLET | Freq: Every day | ORAL | Status: DC
  Administered 2018-08-17 – 2018-08-19 (×3): 40 mg via ORAL
  Filled 2018-08-17 (×3): qty 1

## 2018-08-17 MED ORDER — ONDANSETRON HCL 4 MG/2ML IJ SOLN: 4.0000 mg | Freq: Four times a day (QID) | INTRAMUSCULAR | Status: DC | PRN

## 2018-08-17 MED ORDER — CARVEDILOL 3.125 MG PO TABS
3.1250 mg | ORAL_TABLET | Freq: Two times a day (BID) | ORAL | Status: DC
  Administered 2018-08-17 – 2018-08-19 (×5): 3.125 mg via ORAL
  Filled 2018-08-17 (×7): qty 1

## 2018-08-17 MED ORDER — FUROSEMIDE 10 MG/ML IJ SOLN
40.0000 mg | Freq: Two times a day (BID) | INTRAMUSCULAR | Status: DC
  Administered 2018-08-18: 40 mg via INTRAVENOUS
  Filled 2018-08-17: qty 4

## 2018-08-17 MED ORDER — ONDANSETRON HCL 4 MG PO TABS: 4.0000 mg | ORAL_TABLET | Freq: Four times a day (QID) | ORAL | Status: DC | PRN

## 2018-08-17 NOTE — H&P (Signed)
History and Physical    Kim Mullins ZOX:[id number] DOB: 12-Jul-1930 DOA: 08/17/2018  PCP: Lin Landsman, MD   Patient coming from: Home.  I have personally briefly reviewed patient's old medical records in [place]  Chief Complaint: Shortness of breath.  HPI: Kim Mullins is a 84 y.o. female with medical history significant of anemia, osteoarthritis, asthma, chronic atrial fibrillation, history of bowel obstruction, chronic combined systolic and diastolic heart failure, history of dilated cardiomyopathy, type 2 diabetes, GERD, glaucoma, hypertension, history of left tibial fracture, OSA on CPAP, history of CVA who is coming to the emergency department due to progressively worse dyspnea for the past few days.  However, she states her dyspnea became a lot worse this morning while she was lying down in bed.  Her granddaughter called her PCP and made an appointment for 1500, but the patient stated that she was going to be that by that time.  So they decided to bring her to the emergency department.  She denies chest pain, palpitations, diaphoresis, dizziness, but there is positive PND, frequent orthopnea and mild lower extremities edema.  She denies fever, chills, sore throat, but complains of frequent rhinorrhea.  She states that she has a history of seasonal allergies.  No wheezing, hemoptysis, abdominal pain, nausea, emesis, diarrhea, constipation, melena or hematochezia.  She denies dysuria, frequency or hematuria.  ED Course: Initial vital signs temperature 97.7 F, pulse 65, respirations 32, blood pressure 121/91 mmHg and O2 sat 89% on room air.  The patient received supplemental oxygen and 80 mg of furosemide in the emergency department.  White count was 3.5, hemoglobin 13.4 g/dL and platelets 138.  D-dimer was 1.94 mcg/mL.  Troponin was 0.29 ng/mL.  BNP was 2006.0 pg/mL.  CMP shows CO2 of 21, glucose 105, BUN 27, creatinine 1.86 and total bilirubin 3.4 mg/dL.  The rest  of the LFTs and electrolytes are within normal limits.  Review of Systems: As per HPI otherwise 10 point review of systems negative.   Past Medical History:  Diagnosis Date  . Anemia    occassionally  . Arthritis    all over.  . Asthma    Allergixc reaction to cats.  . Atrial fibrillation ([place])   . Bowel obstruction ([place])   . Chronic systolic CHF (congestive heart failure) (HCC)    EF 30-35% by cath, echo 2016 EF 50%  . Complete heart block (HCC)    a. s/p MDT CRTP pacemaker  . DCM (dilated cardiomyopathy) ([place]) 08/16/2014   normal coronary arteries on cath with EF 30-45%.  EF now 50% by echo 11/2014  . Diabetes mellitus 2006   Diet and exercise controlled.  Marland Kitchen GERD (gastroesophageal reflux disease)    occ  . Glaucoma 08/17/2018  . Hypertension   . Left tibial fracture 2007  . OSA (obstructive sleep apnea) 10/23/2014   Moderate with AHI 21/hr  . Osteoarthritis of right shoulder region 06/26/2013  . Stroke [facility])     Past Surgical History:  Procedure Laterality Date  . ABDOMINAL HYSTERECTOMY  1969  . BI-VENTRICULAR PACEMAKER INSERTION N/A 10/07/2014   MDT CRTP implanted by Dr Lovena Le  . BRAVO [place] STUDY N/A 06/11/2014   Procedure: BRAVO [place] STUDY;  Surgeon: Inda Castle, MD;  Location: [facility] ENDOSCOPY;  Service: Endoscopy;  Laterality: N/A;  . BUNIONECTOMY  1984   Bilateral  . CARDIAC CATHETERIZATION     normal coronary arteries  . Carpal [place]  2004   Bilateral  . CATARACT EXTRACTION Right  early 2015  . corn removal  1999   Bilateral feet  . DILATION AND CURETTAGE OF UTERUS  1968  . ESOPHAGOGASTRODUODENOSCOPY (EGD) WITH PROPOFOL N/A 06/11/2014   Procedure: ESOPHAGOGASTRODUODENOSCOPY (EGD) WITH PROPOFOL;  Surgeon: Inda Castle, MD;  Location: [facility] ENDOSCOPY;  Service: Endoscopy;  Laterality: N/A;  . [place]   Surgery to fix Retainal detachment, bilateral  . JOINT REPLACEMENT    . LEFT AND RIGHT HEART CATHETERIZATION WITH CORONARY ANGIOGRAM N/A 08/29/2014     Procedure: LEFT AND RIGHT HEART CATHETERIZATION WITH CORONARY ANGIOGRAM;  Surgeon: Peter M Martinique, MD;  Location[facility] CATH LAB;  Service: Cardiovascular;  Laterality: N/A;  . [place] DILATION  06/11/2014   Procedure: Venia Minks DILATION;  Surgeon: Inda Castle, MD;  Location: [facility] ENDOSCOPY;  Service: Endoscopy;;  . PILONIDAL CYST EXCISION  1959  . RIGHT HEART CATH N/A 07/24/2018   Procedure: RIGHT HEART CATH;  Surgeon: Larey Dresser, MD;  Location: [place] CV LAB;  Service: Cardiovascular;  Laterality: N/A;  . TONSILLECTOMY  1942  . TOTAL HIP ARTHROPLASTY  11/16/2011   Procedure: TOTAL HIP ARTHROPLASTY ANTERIOR APPROACH;  Surgeon: Hessie Dibble, MD;  Location: [place];  Service: Orthopedics;  Laterality: Right;  DEPUY  . TOTAL SHOULDER ARTHROPLASTY Right 06/26/2013   Procedure: TOTAL SHOULDER ARTHROPLASTY;  Surgeon: Johnny Bridge, MD;  Location: [place];  Service: Orthopedics;  Laterality: Right;     reports that she quit smoking about 40 years ago. Her smoking use included cigarettes. She has a 45.00 pack-year smoking history. She has never used smokeless tobacco. She reports current alcohol use of about 7.0 standard drinks of alcohol per week. She reports that she does not use drugs.  Allergies  Allergen Reactions  . Hydrocodone Other (See Comments)    Took away all energy and made her stop eating food for short time  . Percocet [Oxycodone-Acetaminophen] Other (See Comments)    Too away all energy and made her stop eating food for short time  . Eggs Or Egg-Derived Products Hives and Rash  . Mercurial Derivatives Itching and Rash  . Penicillins Hives and Rash    Has patient had a PCN reaction causing immediate rash, facial/tongue/throat swelling, SOB or lightheadedness with hypotension: Yes Has patient had a PCN reaction causing severe rash involving mucus membranes or skin necrosis: No Has patient had a PCN reaction that required hospitalization: No Has patient had a PCN reaction  occurring within the last 10 years: Yes If all of the above answers are "NO", then may proceed with Cephalosporin use.  . Quinine Derivatives Hives and Rash    Family History  Problem Relation Age of Onset  . Dementia Mother   . Coronary artery disease Mother   . Cancer Father        blood ? type  . Diabetes Maternal Grandfather   . Anuerysm Daughter        brain  . Colon cancer Neg Hx    Prior to Admission medications   Medication Sig Start Date End Date Taking? Authorizing Provider  Acetaminophen (TYLENOL) 325 MG CAPS Take 650 mg by mouth daily as needed (for shoulder pain).     [provider]  apixaban (ELIQUIS) 2.5 MG TABS tablet Take 1 tablet (2.5 mg total) by mouth 2 (two) times daily. 07/26/18   Patrecia Pour, MD  Bisacodyl (LAXATIVE PO) Take 1 tablet by mouth daily as needed (for constipation).     [provider]  bumetanide (BUMEX) 1 MG tablet Take 3 tablets (3 mg total) by mouth 2 (two) times daily. 07/26/18   Patrecia Pour, MD  carvedilol (COREG) 3.125 MG tablet Take 1 tablet (3.125 mg total) by mouth 2 (two) times daily with a meal. 07/26/18   Patrecia Pour, MD  diclofenac sodium (VOLTAREN) 1 % GEL Apply 2 g topically 3 (three) times daily as needed (pain, inflammation). 04/16/16   Roxan Hockey, MD  glucose blood test strip Use to test blood sugar twice daily (Accuchek Smartview) 07/19/16   Larey Dresser, MD  latanoprost (XALATAN) 0.005 % ophthalmic solution Place 1 drop into both eyes at bedtime.  10/20/16   [provider]  potassium chloride SA (K-DUR,KLOR-CON) 20 MEQ tablet Take 1 tablet (20 mEq total) by mouth 2 (two) times daily. 08/14/18   Shirley Friar, PA-C  timolol (TIMOPTIC) 0.5 % ophthalmic solution Place 1 drop into both eyes daily.  07/29/15   [provider]    Physical Exam: Vitals:   08/17/18 1300 08/17/18 1430 08/17/18 1445 08/17/18 1530  BP: 102/80     Pulse: 85 (!) 59    Resp: 15 (!) 29 19   Temp:       TempSrc:      SpO2: 100% 98%    Weight:    66.2 kg  Height:    5\' 3"  (1.6 m)    Constitutional: NAD, calm, comfortable Eyes: PERRL, lids and conjunctivae look normal.  Sclera is icteric. ENMT: Mucous membranes are moist. Posterior pharynx clear of any exudate or lesions. Neck: normal, supple, no masses, no thyromegaly Respiratory: bibasilar rales, no wheezing, no crackles. Normal respiratory effort. No accessory muscle use.  Cardiovascular: Regular rate and rhythm, no murmurs / rubs / gallops.  1+ lower extremities pitting edema. 2+ pedal pulses. No carotid bruits.  Abdomen: Soft, no tenderness, no masses palpated. No hepatosplenomegaly. Bowel sounds positive.  Musculoskeletal: no clubbing / cyanosis.  Good ROM, no contractures. Normal muscle tone.  Skin: no rashes, lesions, ulcers on limited dermatological examination. Neurologic: CN 2-12 grossly intact. Sensation intact, DTR normal. Strength 5/5 in all 4.  Psychiatric: Normal judgment and insight. Alert and oriented x 3. Normal mood.   Labs on Admission: I have personally reviewed following labs and imaging studies  CBC: Recent Labs  Lab 08/17/18 1332  WBC 3.5*  NEUTROABS 1.9  HGB 13.4  HCT 42.8  MCV 91.1  PLT 676*   Basic Metabolic Panel: Recent Labs  Lab 08/17/18 1332  NA 136  K 4.6  CL 101  CO2 21*  GLUCOSE 105*  BUN 27*  CREATININE 1.86*  CALCIUM 9.2   GFR: Estimated Creatinine Clearance: 19.8 mL/min (A) (by C-G formula based on SCr of 1.86 mg/dL (H)). Liver Function Tests: Recent Labs  Lab 08/17/18 1332  AST 31  ALT 13  ALKPHOS 83  BILITOT 3.4*  PROT 7.2  ALBUMIN 3.9   No results for input(s): LIPASE, AMYLASE in the last 168 hours. No results for input(s): AMMONIA in the last 168 hours. Coagulation Profile: No results for input(s): INR, PROTIME in the last 168 hours. Cardiac Enzymes: No results for input(s): CKTOTAL, CKMB, CKMBINDEX, TROPONINI in the last 168 hours. BNP (last 3 results) No  results for input(s): PROBNP in the last 8760 hours. HbA1C: No results for input(s): HGBA1C in the last 72 hours. CBG: No results for input(s): GLUCAP in the last 168 hours. Lipid Profile: No results for input(s): CHOL, HDL, LDLCALC, TRIG, CHOLHDL, LDLDIRECT  in the last 72 hours. Thyroid Function Tests: No results for input(s): TSH, T4TOTAL, FREET4, T3FREE, THYROIDAB in the last 72 hours. Anemia Panel: No results for input(s): VITAMINB12, FOLATE, FERRITIN, TIBC, IRON, RETICCTPCT in the last 72 hours. Urine analysis:    Component Value Date/Time   COLORURINE YELLOW 06/24/2018 2314   APPEARANCEUR HAZY (A) 06/24/2018 2314   LABSPEC 1.013 06/24/2018 2314   PHURINE 6.0 06/24/2018 2314   GLUCOSEU NEGATIVE 06/24/2018 2314   GLUCOSEU NEGATIVE 07/10/2014 1517   HGBUR LARGE (A) 06/24/2018 2314   BILIRUBINUR NEGATIVE 06/24/2018 [place] 06/24/2018 2314   PROTEINUR NEGATIVE 06/24/2018 2314   UROBILINOGEN 0.2 10/06/2014 1552   NITRITE NEGATIVE 06/24/2018 2314   LEUKOCYTESUR MODERATE (A) 06/24/2018 2314    Radiological Exams on Admission: Dg Chest 2 View  Result Date: 08/17/2018 CLINICAL DATA:  Short of breath.  History of CHF EXAM: CHEST - 2 VIEW COMPARISON:  07/18/2018 FINDINGS: Cardiac enlargement. Three lead pacemaker unchanged. Negative for heart failure. Negative for pneumonia or effusion. Right shoulder replacement. Advanced degenerative change left shoulder. IMPRESSION: No active cardiopulmonary disease. Electronically Signed   By: Franchot Gallo M.D.   On: 08/17/2018 15:11   07/24/2018 right heart cath  RIGHT HEART CATH  Conclusion   1. Elevated filling pressures, R>L.  Severe TR may make right-sided failure worse.  2. Good cardiac output on milrinone.  3. Pulmonary venous hypertension.    07/21/2018 echocardiogram complete ------------------------------------------------------------------- LV EF: 35% -    40%  ------------------------------------------------------------------- Indications:      CHF - 428.0.  ------------------------------------------------------------------- History:   PMH:   Atrial fibrillation.  Risk factors: Hypertension. Diabetes mellitus.  ------------------------------------------------------------------- Study Conclusions  - Left ventricle: The cavity size was normal. Wall thickness was   increased in a pattern of moderate LVH. Systolic function was   moderately reduced. The estimated ejection fraction was in the   range of 35% to 40%. There is akinesis of the   basal-midinferolateral myocardium. There was no evidence of   elevated ventricular filling pressure by Doppler parameters. - Mitral valve: There was mild regurgitation. - Left atrium: The atrium was moderately dilated. - Right ventricle: The cavity size was mildly dilated. Wall   thickness was normal. - Right atrium: The atrium was severely dilated. - Tricuspid valve: There was severe regurgitation. - Pulmonic valve: There was moderate regurgitation. - Pulmonary arteries: Systolic pressure was mildly increased. PA   peak pressure: 39 mm Hg (S).  EKG: Independently reviewed.  Vent. rate 71 BPM PR interval * ms QRS duration 136 ms QT/QTc 483/525 ms P-R-T axes 211 -28 -24 Sinus or ectopic atrial rhythm Sinus pause Prolonged PR interval Right bundle branch block Probable anteroseptal infarct, old  Assessment/Plan Principal Problem:   Acute on chronic combined systolic and diastolic heart failure (HCC) Observation/telemetry. Continue supplemental oxygen. Fluid and sodium restriction. Furosemide 40 mg IVP twice a day. Monitor daily weights, renal function and electrolytes. Continue carvedilol 3.125 mg p.o. twice daily.  Active Problems:   Benign essential HTN On carvedilol and furosemide. Monitor BP, HR, renal function electrolytes.    OSA (obstructive sleep apnea) CPAP at  bedtime.    Atrial fibrillation (HCC) CHA?DS?-VASc Score of at least 7. Continue apixaban. Continue carvedilol 3.125 mg p.o. twice daily for rate control.    GERD (gastroesophageal reflux disease) Protonix 40 mg p.o. daily    Elevated troponin Trend troponin level.    Glaucoma Continue Xalatan drops at bedtime. Continue timolol drops daily.  Hyperbilirubinemia Worsen  from October when he was 2.7.  Today is 3.4 mg/dL.  Fatty liver on [place] in October. No gallstones or CBD obstruction on RUQ US done in October. Follow-up hepatic function tests while in the hospital.   DVT prophylaxis: On apixaban. Code Status: Full code. Family Communication: Her granddaughter was present in the ED room. Disposition Plan: Observation for CHF exacerbation treatment. Consults called: Admission status: Observation/telemetry.   Reubin Milan MD [facility]   If 7PM-7AM, please contact night-coverage [email or web address] Password [facility]  08/17/2018, 5:47 PM

## 2018-08-17 NOTE — ED Notes (Signed)
Pt placed on 2L of O2 to ease work of breathing. Difficulty obtaining accurate pulse oximetry reading at this time, both hands, earlobe, and forehead locations tried.

## 2018-08-17 NOTE — Addendum Note (Signed)
Encounter addended by: Shirley Friar, PA-C on: 08/17/2018 4:20 PM  Actions taken: LOS modified

## 2018-08-17 NOTE — Progress Notes (Signed)
Troponin 0.29, no s/s, MD notified, will continue to monitor, Thanks Arvella Nigh RN.

## 2018-08-17 NOTE — ED Triage Notes (Signed)
Pt arrives to ED with complaints of "not being able to catch her breath" for a few days, worse today. Pt's granddaughter bedside states the pt does not tolerate laying flat r/t difficulty breathing. Pt has CHF, recent increase of potassium at home. Pt was seen here recenty for fluid diureses for a week.Pt placed in position of comfort with call bell in reach, bed locked and lowered.

## 2018-08-17 NOTE — ED Notes (Signed)
Report given to floor; pt is ready for transport upon return to ED after completion of nuclear medicine study.

## 2018-08-17 NOTE — ED Notes (Signed)
Lab to add on d-dimer 

## 2018-08-17 NOTE — ED Provider Notes (Signed)
[facility] EMERGENCY DEPARTMENT Provider Note   CSN: [id number] Arrival date & time: 08/17/18  1227     History   Chief Complaint Chief Complaint  Patient presents with  . Shortness of Breath    HPI Kim Mullins is a 84 y.o. female.  Patient with history of systolic heart failure on bumetanide, status post pacemaker placement, atrial fibrillation, pulmonary hypertension --presents the emergency department for progressive shortness of breath.  Patient was admitted in November 2019 for a heart failure exacerbation.  She states that her breathing has not been back to baseline since her hospitalization but became much worse this morning.  She is not on home oxygen.  Her granddaughter called PCP for an appointment early this afternoon, obtained one at [facility], and told her granddaughter "I will be dead by 3pm".  She decided to come to the emergency department immediately.  Patient has been having trouble lying flat.  She states she has had ongoing but not worsening lower extremity swelling.  She denies any chest pain, abdominal pain.  Sometimes she states "it feels like there are worms in my stomach".  States compliance with her medications.  She wears CPAP at night.       Past Medical History:  Diagnosis Date  . Anemia    occassionally  . Arthritis    all over.  . Asthma    Allergixc reaction to cats.  . Atrial fibrillation ([place])   . Bowel obstruction ([place])   . Chronic systolic CHF (congestive heart failure) (HCC)    EF 30-35% by cath, echo 2016 EF 50%  . Complete heart block (HCC)    a. s/p MDT CRTP pacemaker  . DCM (dilated cardiomyopathy) ([place]) 08/16/2014   normal coronary arteries on cath with EF 30-45%.  EF now 50% by echo 11/2014  . Diabetes mellitus 2006   Diet and exercise controlled.  Marland Kitchen GERD (gastroesophageal reflux disease)    occ  . Hypertension   . Left tibial fracture 2007  . OSA (obstructive sleep apnea) 10/23/2014   Moderate with AHI  21/hr  . Osteoarthritis of right shoulder region 06/26/2013  . Stroke [facility])     Patient Active Problem List   Diagnosis Date Noted  . CHF (congestive heart failure) ([place]) 07/18/2018  . Gait abnormality 05/03/2017  . Hypotension 04/14/2016  . Hypotension due to drugs   . Chronic anticoagulation   . TIA (transient ischemic attack) 04/12/2016  . UTI (lower urinary tract infection) 04/12/2016  . AKI (acute kidney injury) ([place]) 04/12/2016  . Atrial fibrillation ([place]) 09/12/2015  . Occipital infarction ([place])   . History of stroke 06/18/2015  . Homonymous hemianopsia   . Pacemaker 01/21/2015  . OSA (obstructive sleep apnea) 10/23/2014  . Dizziness 10/21/2014  . Complete heart block ([place]) 10/06/2014  . Benign essential HTN 10/04/2014  . Acute on chronic systolic CHF (congestive heart failure) ([place]) 10/04/2014  . Abnormal cardiovascular stress test 08/27/2014  . Pulmonary HTN ([place]) 08/16/2014  . DCM (dilated cardiomyopathy) ([place]) 08/16/2014  . Cough 07/13/2014  . Esophageal stricture 06/11/2014  . Nonspecific (abnormal) findings on radiological and other examination of gastrointestinal tract 05/29/2014  . Dyspnea 04/29/2014  . Osteoarthritis of right shoulder region 06/26/2013  . DJD (degenerative joint disease) of hip 11/16/2011    Class: Present on Admission    Past Surgical History:  Procedure Laterality Date  . ABDOMINAL HYSTERECTOMY  1969  . BI-VENTRICULAR PACEMAKER INSERTION N/A 10/07/2014   MDT CRTP implanted by Dr  Lovena Le  . BRAVO [place] STUDY N/A 06/11/2014   Procedure: BRAVO [place] STUDY;  Surgeon: Inda Castle, MD;  Location: [facility] ENDOSCOPY;  Service: Endoscopy;  Laterality: N/A;  . BUNIONECTOMY  1984   Bilateral  . CARDIAC CATHETERIZATION     normal coronary arteries  . Carpal [place]  2004   Bilateral  . CATARACT EXTRACTION Right    early 2015  . corn removal  1999   Bilateral feet  . DILATION AND CURETTAGE OF UTERUS  1968  . ESOPHAGOGASTRODUODENOSCOPY (EGD) WITH  PROPOFOL N/A 06/11/2014   Procedure: ESOPHAGOGASTRODUODENOSCOPY (EGD) WITH PROPOFOL;  Surgeon: Inda Castle, MD;  Location: [facility] ENDOSCOPY;  Service: Endoscopy;  Laterality: N/A;  . [place]   Surgery to fix Retainal detachment, bilateral  . JOINT REPLACEMENT    . LEFT AND RIGHT HEART CATHETERIZATION WITH CORONARY ANGIOGRAM N/A 08/29/2014   Procedure: LEFT AND RIGHT HEART CATHETERIZATION WITH CORONARY ANGIOGRAM;  Surgeon: Peter M Martinique, MD;  Location[facility] C[facility] LAB;  Service: Cardiovascular;  Laterality: N/A;  . MALONEY DILATION  06/11/2014   Procedure: Venia Minks DILATION;  Surgeon: Inda Castle, MD;  Location: [facility] ENDOSCOPY;  Service: Endoscopy;;  . PILONIDAL CYST EXCISION  1959  . RIGHT HEART CATH N/A 07/24/2018   Procedure: RIGHT HEART CATH;  Surgeon: Larey Dresser, MD;  Location: [facility] CV LAB;  Service: Cardiovascular;  Laterality: N/A;  . TONSILLECTOMY  1942  . TOTAL HIP ARTHROPLASTY  11/16/2011   Procedure: TOTAL HIP ARTHROPLASTY ANTERIOR APPROACH;  Surgeon: Hessie Dibble, MD;  Location: [facility];  Service: Orthopedics;  Laterality: Right;  DEPUY  . TOTAL SHOULDER ARTHROPLASTY Right 06/26/2013   Procedure: TOTAL SHOULDER ARTHROPLASTY;  Surgeon: Johnny Bridge, MD;  Location: [facility];  Service: Orthopedics;  Laterality: Right;     OB History   No obstetric history on file.      Home Medications    Prior to Admission medications   Medication Sig Start Date End Date Taking? Authorizing Provider  Acetaminophen (TYLENOL) 325 MG CAPS Take 650 mg by mouth daily as needed (for shoulder pain).     [provider]  apixaban (ELIQUIS) 2.5 MG TABS tablet Take 1 tablet (2.5 mg total) by mouth 2 (two) times daily. 07/26/18   Patrecia Pour, MD  Bisacodyl (LAXATIVE PO) Take 1 tablet by mouth daily as needed (for constipation).     [provider]  bumetanide (BUMEX) 1 MG tablet Take 3 tablets (3 mg total) by mouth 2 (two) times daily. 07/26/18   Patrecia Pour,  MD  carvedilol (COREG) 3.125 MG tablet Take 1 tablet (3.125 mg total) by mouth 2 (two) times daily with a meal. 07/26/18   Patrecia Pour, MD  diclofenac sodium (VOLTAREN) 1 % GEL Apply 2 g topically 3 (three) times daily as needed (pain, inflammation). 04/16/16   Roxan Hockey, MD  glucose blood test strip Use to test blood sugar twice daily ([employer]) 07/19/16   Larey Dresser, MD  latanoprost (XALATAN) 0.005 % ophthalmic solution Place 1 drop into both eyes at bedtime.  10/20/16   [provider]  potassium chloride SA (K-DUR,KLOR-CON) 20 MEQ tablet Take 1 tablet (20 mEq total) by mouth 2 (two) times daily. 08/14/18   Shirley Friar, PA-C  timolol (TIMOPTIC) 0.5 % ophthalmic solution Place 1 drop into both eyes daily.  07/29/15   [provider]    Family History Family History  Problem Relation Age of Onset  .  Dementia Mother   . Coronary artery disease Mother   . Cancer Father        blood ? type  . Diabetes Maternal Grandfather   . Anuerysm Daughter        brain  . Colon cancer Neg Hx     Social History Social History   Tobacco Use  . Smoking status: Former Smoker    Packs/day: 1.00    Years: 45.00    Pack years: 45.00    Types: Cigarettes    Last attempt to quit: 07/24/1978    Years since quitting: 40.0  . Smokeless tobacco: Never Used  Substance Use Topics  . Alcohol use: Yes    Alcohol/week: 7.0 standard drinks    Types: 7 Glasses of wine per week    Comment: every day  . Drug use: No     Allergies   Hydrocodone; Percocet [oxycodone-acetaminophen]; Eggs or egg-derived products; Mercurial derivatives; Penicillins; and Quinine derivatives   Review of Systems Review of Systems  Constitutional: Negative for fever.  HENT: Negative for rhinorrhea and sore throat.   Eyes: Negative for redness.  Respiratory: Positive for shortness of breath. Negative for cough.   Cardiovascular: Positive for leg swelling. Negative for chest  pain.  Gastrointestinal: Negative for abdominal pain, diarrhea, nausea and vomiting.  Genitourinary: Negative for dysuria.  Musculoskeletal: Negative for myalgias.  Skin: Negative for rash.  Neurological: Negative for headaches.     Physical Exam Updated Vital Signs BP (!) 121/91 (BP Location: Right Arm)   Pulse 65   Temp 97.7 F (36.5 C) (Oral)   Resp (!) 32   Ht 5\' 3"  (1.6 m)   Wt 69 kg   SpO2 (!) 89%   BMI 26.95 kg/m   Physical Exam Vitals signs and nursing note reviewed.  Constitutional:      Appearance: She is well-developed.  HENT:     Head: Normocephalic and atraumatic.  Eyes:     General:        Right eye: No discharge.        Left eye: No discharge.     Conjunctiva/sclera: Conjunctivae normal.  Neck:     Musculoskeletal: Normal range of motion and neck supple.     Vascular: JVD present.  Cardiovascular:     Rate and Rhythm: Normal rate and regular rhythm.     Comments: Distant heart sounds. I cannot hear a murmur.  Pulmonary:     Effort: Pulmonary effort is normal.     Breath sounds: Normal breath sounds. No decreased breath sounds, wheezing or rhonchi.  Abdominal:     Palpations: Abdomen is soft.     Tenderness: There is no abdominal tenderness.  Musculoskeletal:     Right lower leg: She exhibits no tenderness. Edema (1+ to mid-ankles) present.     Left lower leg: She exhibits no tenderness. Edema (1+ to mid-ankles) present.  Skin:    General: Skin is warm and dry.  Neurological:     Mental Status: She is alert.      ED Treatments / Results  Labs (all labs ordered are listed, but only abnormal results are displayed) Labs Reviewed  CBC WITH DIFFERENTIAL/PLATELET - Abnormal; Notable for the following components:      Result Value   WBC 3.5 (*)    RDW 17.1 (*)    Platelets 138 (*)    All other components within normal limits  COMPREHENSIVE METABOLIC PANEL - Abnormal; Notable for the following components:   CO2 21 (*)  Glucose, Bld 105 (*)      BUN 27 (*)    Creatinine, Ser 1.86 (*)    Total Bilirubin 3.4 (*)    GFR calc non Af Amer 24 (*)    GFR calc Af Amer 28 (*)    All other components within normal limits  BRAIN NATRIURETIC PEPTIDE - Abnormal; Notable for the following components:   B Natriuretic Peptide 2,006.0 (*)    All other components within normal limits  D-DIMER, QUANTITATIVE (NOT AT [facility]) - Abnormal; Notable for the following components:   D-Dimer, Quant 1.94 (*)    All other components within normal limits  I-STAT TROPONIN, ED - Abnormal; Notable for the following components:   Troponin i, poc 0.29 (*)    All other components within normal limits    EKG EKG Interpretation  Date/Time:  Thursday August 17 2018 12:34:43 EST Ventricular Rate:  71 PR Interval:    QRS Duration: 136 QT Interval:  483 QTC Calculation: 525 R Axis:   -28 Text Interpretation:  Sinus or ectopic atrial rhythm Sinus pause Prolonged PR interval Right bundle branch block Probable anteroseptal infarct, old Confirmed by Quintella Reichert [id number]) on 08/17/2018 2:42:06 PM   Radiology Dg Chest 2 View  Result Date: 08/17/2018 CLINICAL DATA:  Short of breath.  History of CHF EXAM: CHEST - 2 VIEW COMPARISON:  07/18/2018 FINDINGS: Cardiac enlargement. Three lead pacemaker unchanged. Negative for heart failure. Negative for pneumonia or effusion. Right shoulder replacement. Advanced degenerative change left shoulder. IMPRESSION: No active cardiopulmonary disease. Electronically Signed   By: Franchot Gallo M.D.   On: 08/17/2018 15:11    Procedures Procedures (including critical care time)  Medications Ordered in ED Medications  furosemide (LASIX) injection 80 mg (80 mg Intravenous Given 08/17/18 1555)     Initial Impression / Assessment and Plan / ED Course  I have reviewed the triage vital signs and the nursing notes.  Pertinent labs & imaging results that were available during my care of the patient were reviewed by me and  considered in my medical decision making (see chart for details).     Patient seen and examined. Work-up initiated.  Patient on supplemental oxygen.  Her oxygen on arrival was 89% on room air.    Vital signs reviewed and are as follows: BP (!) 121/91 (BP Location: Right Arm)   Pulse 65   Temp 97.7 F (36.5 C) (Oral)   Resp (!) 32   Ht 5\' 3"  (1.6 m)   Wt 69 kg   SpO2 (!) 89%   BMI 26.95 kg/m   3:30 PM patient rechecked.  Her breathing appears more comfortable.  BNP is elevated.  Kidney function is better than at previous admission.  Patient was treated for heart failure exacerbation with Lasix 120 mg twice daily during that admission and was actually on milrinone.   Last documented weight was 152 pounds on 12/12 during her cardiology appointment follow-up. Will re-weigh.  Patient will be given Lasix here.  D-dimer added.  Chest x-ray reviewed and appears clear, however it did at time of last admission as well.  Will admit for further work-up.  3:46 PM D-dimer elevated. V/Q scan ordered.   3:58 PM Spoke with Dr. Olevia Bowens who will see patient.   Final Clinical Impressions(s) / ED Diagnoses   Final diagnoses:  Respiratory failure, unspecified chronicity, unspecified whether with hypoxia or hypercapnia (HCC)  Acute on chronic systolic congestive heart failure ([place])   Admit.   ED Discharge Orders  None       Carlisle Cater, PA-C 08/17/18 1558    Quintella Reichert, MD 08/18/18 [phone]

## 2018-08-18 ENCOUNTER — Encounter (HOSPITAL_COMMUNITY): Payer: Self-pay | Admitting: Internal Medicine

## 2018-08-18 ENCOUNTER — Other Ambulatory Visit: Payer: Self-pay | Admitting: Cardiology

## 2018-08-18 DIAGNOSIS — E119 Type 2 diabetes mellitus without complications: Secondary | ICD-10-CM | POA: Insufficient documentation

## 2018-08-18 DIAGNOSIS — I10 Essential (primary) hypertension: Secondary | ICD-10-CM | POA: Diagnosis not present

## 2018-08-18 DIAGNOSIS — J9601 Acute respiratory failure with hypoxia: Secondary | ICD-10-CM | POA: Diagnosis present

## 2018-08-18 DIAGNOSIS — E859 Amyloidosis, unspecified: Secondary | ICD-10-CM

## 2018-08-18 DIAGNOSIS — N189 Chronic kidney disease, unspecified: Secondary | ICD-10-CM

## 2018-08-18 DIAGNOSIS — I5043 Acute on chronic combined systolic (congestive) and diastolic (congestive) heart failure: Secondary | ICD-10-CM | POA: Diagnosis not present

## 2018-08-18 DIAGNOSIS — N185 Chronic kidney disease, stage 5: Secondary | ICD-10-CM

## 2018-08-18 LAB — BASIC METABOLIC PANEL
Anion gap: 12 (ref 5–15)
BUN: 27 mg/dL — ABNORMAL HIGH (ref 8–23)
CALCIUM: 9.1 mg/dL (ref 8.9–10.3)
CO2: 24 mmol/L (ref 22–32)
CREATININE: 1.79 mg/dL — AB (ref 0.44–1.00)
Chloride: 103 mmol/L (ref 98–111)
GFR calc Af Amer: 29 mL/min — ABNORMAL LOW (ref >60)
GFR calc non Af Amer: 25 mL/min — ABNORMAL LOW (ref >60)
Glucose, Bld: 94 mg/dL (ref 70–99)
Potassium: 3.8 mmol/L (ref 3.5–5.1)
Sodium: 139 mmol/L (ref 135–145)

## 2018-08-18 LAB — CBC WITH DIFFERENTIAL/PLATELET
Abs Immature Granulocytes: 0.02 10*3/uL (ref 0.00–0.07)
Basophils Absolute: 0.1 10*3/uL (ref 0.0–0.1)
Basophils Relative: 2 %
EOS PCT: 4 %
Eosinophils Absolute: 0.1 10*3/uL (ref 0.0–0.5)
HCT: 40.3 % (ref 36.0–46.0)
Hemoglobin: 12.6 g/dL (ref 12.0–15.0)
Immature Granulocytes: 1 %
Lymphocytes Relative: 32 %
Lymphs Abs: 1 10*3/uL (ref 0.7–4.0)
MCH: 28.3 pg (ref 26.0–34.0)
MCHC: 31.3 g/dL (ref 30.0–36.0)
MCV: 90.6 fL (ref 80.0–100.0)
Monocytes Absolute: 0.2 10*3/uL (ref 0.1–1.0)
Monocytes Relative: 7 %
Neutro Abs: 1.8 10*3/uL (ref 1.7–7.7)
Neutrophils Relative %: 54 %
Platelets: 121 10*3/uL — ABNORMAL LOW (ref 150–400)
RBC: 4.45 MIL/uL (ref 3.87–5.11)
RDW: 17.1 % — AB (ref 11.5–15.5)
WBC: 3.2 10*3/uL — ABNORMAL LOW (ref 4.0–10.5)
nRBC: 0 % (ref 0.0–0.2)

## 2018-08-18 LAB — GLUCOSE, CAPILLARY
Glucose-Capillary: 67 mg/dL — ABNORMAL LOW (ref 70–99)
Glucose-Capillary: 100 mg/dL — ABNORMAL HIGH (ref 70–99)
Glucose-Capillary: 135 mg/dL — ABNORMAL HIGH (ref 70–99)
Glucose-Capillary: 107 mg/dL — ABNORMAL HIGH (ref 70–99)
Glucose-Capillary: 148 mg/dL — ABNORMAL HIGH (ref 70–99)

## 2018-08-18 LAB — HEPATIC FUNCTION PANEL
ALT: 12 U/L (ref 0–44)
AST: 29 U/L (ref 15–41)
Albumin: 3.3 g/dL — ABNORMAL LOW (ref 3.5–5.0)
Alkaline Phosphatase: 78 U/L (ref 38–126)
BILIRUBIN DIRECT: 1 mg/dL — AB (ref 0.0–0.2)
Indirect Bilirubin: 2.1 mg/dL — ABNORMAL HIGH (ref 0.3–0.9)
Total Bilirubin: 3.1 mg/dL — ABNORMAL HIGH (ref 0.3–1.2)
Total Protein: 6.9 g/dL (ref 6.5–8.1)

## 2018-08-18 LAB — TROPONIN I
Troponin I: 0.28 ng/mL (ref <0.03)
Troponin I: 0.25 ng/mL (ref <0.03)

## 2018-08-18 MED ORDER — FUROSEMIDE 10 MG/ML IJ SOLN
120.0000 mg | Freq: Two times a day (BID) | INTRAVENOUS | Status: DC
  Administered 2018-08-18 – 2018-08-19 (×3): 120 mg via INTRAVENOUS
  Filled 2018-08-18: qty 12
  Filled 2018-08-18 (×3): qty 10
  Filled 2018-08-18: qty 12

## 2018-08-18 MED ORDER — DOCUSATE SODIUM 100 MG PO CAPS
100.0000 mg | ORAL_CAPSULE | Freq: Every day | ORAL | Status: DC
  Administered 2018-08-18 – 2018-08-19 (×2): 100 mg via ORAL
  Filled 2018-08-18 (×5): qty 1

## 2018-08-18 MED ORDER — FUROSEMIDE 10 MG/ML IJ SOLN
120.0000 mg | Freq: Two times a day (BID) | INTRAVENOUS | Status: DC
  Filled 2018-08-18: qty 12

## 2018-08-18 NOTE — Progress Notes (Addendum)
Advanced Heart Failure Team Consult Note   Primary Physician: Lin Landsman, MD PCP-Cardiologist:  Dr Radford Pax HF MD: Dr Aundra Dubin   Reason for Consultation: Heart Failure   HPI:    Kim Mullins is seen today for evaluation of heart failure at the request of Dr Eliseo Squires.   Kim Mullins is an 84 year old with history of chronic systolic heart failure, NICM, complete heart block with medtronic CRT-P, OSA, PAF, TIA, DMII, GERD, CKD Stage IV, and TTR amyloidosis. She has been on bumex because torsemide was to expensive.   She was diagnosed with TTR amyloidosis in October 2019. PYP scan strongly suggestive with contralateral lung ratio >1.5 and visual score 3. She has not started tafamidis but has completed the approval process.   Recently admitted 11/19- 07/26/2018 with volume overload. She was diuresed with IV lasix and placed on milrinone to augment diuresis Diuresed with high dose lasix 120 mg twice a day. As she improved milrinone was stopped. She underwent RHC that showed worsening r sided failure, severe TR and good cardiac output on milrinone.  She was discharged on bumex 3 mg twice a day.   She has been followed closely in the HF clinic and was last seen 08/10/2018. At that time she was stable and continued on bumex 3 mg twice a day.  Yesterday she presented to [facility] with increased shortness of breath. Over the last few days she had gradual worsening dyspnea. She had been taking all medications but had felt worse. Her granddaughter had called her PCP and had planned to see her at 3:00 but she felt like she was going to die so she came to the ED.  O2 sats on arrival were 89% on room air. In the ED she was given 80 mg IV lasix. CXR was negative for edema. CTA was negative for PE. Pertinent admission labs included: BNP 2006, creatinine 1.8, troponin 0.29, hgb 13.4, and WBC 3.5.   Admitted by the hospitalist service. She has received with 40 mg of lasix today. Sluggish diuresis noted. She  remains SOB with exertion. Denies chest pain.   Medtronic Interrogation  Acitivity < 1 hour per day. Optivol well above threshold. < 0.1% AF/AT  RHC 07/24/2018  RA mean 16 RV 42/16 PA 41/19, mean 29 PCWP mean 22 Oxygen saturations: PA 66% AO 99% Cardiac Output (Fick) 4.87  Cardiac Index (Fick) 2.74 PVR 1.4 WU   Echo 07/21/2018 Left ventricle: The cavity size was normal. Wall thickness was   increased in a pattern of moderate LVH. Systolic function was   moderately reduced. The estimated ejection fraction was in the   range of 35% to 40%. There is akinesis of the   basal-midinferolateral myocardium. There was no evidence of   elevated ventricular filling pressure by Doppler parameters. - Mitral valve: There was mild regurgitation. - Left atrium: The atrium was moderately dilated. - Right ventricle: The cavity size was mildly dilated. Wall   thickness was normal. - Right atrium: The atrium was severely dilated. - Tricuspid valve: There was severe regurgitation. - Pulmonic valve: There was moderate regurgitation. - Pulmonary arteries: Systolic pressure was mildly increased. PA   peak pressure: 39 mm Hg (S). Review of Systems: [y] = yes, [ ]  = no   General: Weight gain [ ] ; Weight loss [ ] ; Anorexia [ ] ; Fatigue [ Y]; Fever [ ] ; Chills [ ] ; Weakness [ ]   Cardiac: Chest pain/pressure [ ] ; Resting SOB [Y ]; Exertional SOB [Y ];  Orthopnea [Y ]; Pedal Edema [ ] ; Palpitations [ ] ; Syncope [ ] ; Presyncope [ ] ; Paroxysmal nocturnal dyspnea[ ]   Pulmonary: Cough [ ] ; Wheezing[ ] ; Hemoptysis[ ] ; Sputum [ ] ; Snoring [ ]   GI: Vomiting[ ] ; Dysphagia[ ] ; Melena[ ] ; Hematochezia [ ] ; Heartburn[ ] ; Abdominal pain [ ] ; Constipation [ ] ; Diarrhea [ ] ; BRBPR [ ]   GU: Hematuria[ ] ; Dysuria [ ] ; Nocturia[ ]   Vascular: Pain in legs with walking [ ] ; Pain in feet with lying flat [ ] ; Non-healing sores [ ] ; Stroke [ ] ; TIA [Y ]; Slurred speech [ ] ;  Neuro: Headaches[ ] ; Vertigo[ ] ; Seizures[ ] ;  Paresthesias[ ] ;Blurred vision [ ] ; Diplopia [ ] ; Vision changes [ ]   Ortho/Skin: Arthritis [ ] ; Joint pain [Y ]; Muscle pain [ ] ; Joint swelling [ ] ; Back Pain [Y ]; Rash [ ]   Psych: Depression[ ] ; Anxiety[ ]   Heme: Bleeding problems [ ] ; Clotting disorders [ ] ; Anemia [ ]   Endocrine: Diabetes [Y ]; Thyroid dysfunction[ ]   Home Medications Prior to Admission medications   Medication Sig Start Date End Date Taking? Authorizing Provider  Acetaminophen (TYLENOL) 325 MG CAPS Take 650 mg by mouth daily as needed (for shoulder pain).     [provider]  apixaban (ELIQUIS) 2.5 MG TABS tablet Take 1 tablet (2.5 mg total) by mouth 2 (two) times daily. 07/26/18   Patrecia Pour, MD  Bisacodyl (LAXATIVE PO) Take 1 tablet by mouth daily as needed (for constipation).     [provider]  bumetanide (BUMEX) 1 MG tablet Take 3 tablets (3 mg total) by mouth 2 (two) times daily. 07/26/18   Patrecia Pour, MD  carvedilol (COREG) 3.125 MG tablet Take 1 tablet (3.125 mg total) by mouth 2 (two) times daily with a meal. 07/26/18   Patrecia Pour, MD  diclofenac sodium (VOLTAREN) 1 % GEL Apply 2 g topically 3 (three) times daily as needed (pain, inflammation). 04/16/16   Roxan Hockey, MD  glucose blood test strip Use to test blood sugar twice daily ([employer] Smartview) 07/19/16   Larey Dresser, MD  latanoprost (XALATAN) 0.005 % ophthalmic solution Place 1 drop into both eyes at bedtime.  10/20/16   [provider]  potassium chloride SA (K-DUR,KLOR-CON) 20 MEQ tablet Take 1 tablet (20 mEq total) by mouth 2 (two) times daily. 08/14/18   Shirley Friar, PA-C  timolol (TIMOPTIC) 0.5 % ophthalmic solution Place 1 drop into both eyes daily.  07/29/15   [provider]    Past Medical History: Past Medical History:  Diagnosis Date  . Anemia    occassionally  . Arthritis    all over.  . Asthma    Allergixc reaction to cats.  . Atrial fibrillation ([place])   . Bowel  obstruction ([place])   . Chronic systolic CHF (congestive heart failure) (HCC)    EF 30-35% by cath, echo 2016 EF 50%  . Complete heart block (HCC)    a. s/p MDT CRTP pacemaker  . DCM (dilated cardiomyopathy) ([place]) 08/16/2014   normal coronary arteries on cath with EF 30-45%.  EF now 50% by echo 11/2014  . Diabetes mellitus 2006   Diet and exercise controlled.  Marland Kitchen GERD (gastroesophageal reflux disease)    occ  . Glaucoma 08/17/2018  . Hypertension   . Left tibial fracture 2007  . OSA (obstructive sleep apnea) 10/23/2014   Moderate with AHI 21/hr  . Osteoarthritis of right shoulder region 06/26/2013  . Stroke [facility])  Past Surgical History: Past Surgical History:  Procedure Laterality Date  . ABDOMINAL HYSTERECTOMY  1969  . BI-VENTRICULAR PACEMAKER INSERTION N/A 10/07/2014   MDT CRTP implanted by Dr Lovena Le  . BRAVO [place] STUDY N/A 06/11/2014   Procedure: BRAVO [place] STUDY;  Surgeon: Inda Castle, MD;  Location: [facility] ENDOSCOPY;  Service: Endoscopy;  Laterality: N/A;  . BUNIONECTOMY  1984   Bilateral  . CARDIAC CATHETERIZATION     normal coronary arteries  . Carpal Tunnell  2004   Bilateral  . CATARACT EXTRACTION Right    early 2015  . corn removal  1999   Bilateral feet  . DILATION AND CURETTAGE OF UTERUS  1968  . ESOPHAGOGASTRODUODENOSCOPY (EGD) WITH PROPOFOL N/A 06/11/2014   Procedure: ESOPHAGOGASTRODUODENOSCOPY (EGD) WITH PROPOFOL;  Surgeon: Inda Castle, MD;  Location: [facility] ENDOSCOPY;  Service: Endoscopy;  Laterality: N/A;  . [place]   Surgery to fix Retainal detachment, bilateral  . JOINT REPLACEMENT    . LEFT AND RIGHT HEART CATHETERIZATION WITH CORONARY ANGIOGRAM N/A 08/29/2014   Procedure: LEFT AND RIGHT HEART CATHETERIZATION WITH CORONARY ANGIOGRAM;  Surgeon: Peter M Martinique, MD;  Location: [facility] CATH LAB;  Service: Cardiovascular;  Laterality: N/A;  . MALONEY DILATION  06/11/2014   Procedure: Venia Minks DILATION;  Surgeon: Inda Castle, MD;  Location: [facility] ENDOSCOPY;   Service: Endoscopy;;  . PILONIDAL CYST EXCISION  1959  . RIGHT HEART CATH N/A 07/24/2018   Procedure: RIGHT HEART CATH;  Surgeon: Larey Dresser, MD;  Location: [place] CV LAB;  Service: Cardiovascular;  Laterality: N/A;  . TONSILLECTOMY  1942  . TOTAL HIP ARTHROPLASTY  11/16/2011   Procedure: TOTAL HIP ARTHROPLASTY ANTERIOR APPROACH;  Surgeon: Hessie Dibble, MD;  Location: [place];  Service: Orthopedics;  Laterality: Right;  DEPUY  . TOTAL SHOULDER ARTHROPLASTY Right 06/26/2013   Procedure: TOTAL SHOULDER ARTHROPLASTY;  Surgeon: Johnny Bridge, MD;  Location: [place];  Service: Orthopedics;  Laterality: Right;    Family History: Family History  Problem Relation Age of Onset  . Dementia Mother   . Coronary artery disease Mother   . Cancer Father        blood ? type  . Diabetes Maternal Grandfather   . Anuerysm Daughter        brain  . Colon cancer Neg Hx     Social History: Social History   Socioeconomic History  . Marital status: Widowed    Spouse name: Not on file  . Number of children: 0  . Years of education: Not on file  . Highest education level: Doctorate  Occupational History  . Occupation: retired  Scientific laboratory technician  . Financial resource strain: Not hard at all  . Food insecurity:    Worry: Never true    Inability: Never true  . Transportation needs:    Medical: No    Non-medical: No  Tobacco Use  . Smoking status: Former Smoker    Packs/day: 1.00    Years: 45.00    Pack years: 45.00    Types: Cigarettes    Last attempt to quit: 07/24/1978    Years since quitting: 40.0  . Smokeless tobacco: Never Used  Substance and Sexual Activity  . Alcohol use: Yes    Alcohol/week: 7.0 standard drinks    Types: 7 Glasses of wine per week    Comment: every day  . Drug use: No  . Sexual activity: Not Currently  Lifestyle  . Physical activity:    Days  per week: Not on file    Minutes per session: Not on file  . Stress: Not on file  Relationships  . Social  connections:    Talks on phone: Not on file    Gets together: Not on file    Attends religious service: Not on file    Active member of club or organization: Not on file    Attends meetings of clubs or organizations: Not on file    Relationship status: Not on file  Other Topics Concern  . Not on file  Social History Narrative   Lives alone    Allergies:  Allergies  Allergen Reactions  . Hydrocodone Other (See Comments)    Took away all energy and made her stop eating food for short time  . Percocet [Oxycodone-Acetaminophen] Other (See Comments)    Too away all energy and made her stop eating food for short time  . Eggs Or Egg-Derived Products Hives and Rash  . Mercurial Derivatives Itching and Rash  . Penicillins Hives and Rash    Has patient had a PCN reaction causing immediate rash, facial/tongue/throat swelling, SOB or lightheadedness with hypotension: Yes Has patient had a PCN reaction causing severe rash involving mucus membranes or skin necrosis: No Has patient had a PCN reaction that required hospitalization: No Has patient had a PCN reaction occurring within the last 10 years: Yes If all of the above answers are "NO", then may proceed with Cephalosporin use.  . Quinine Derivatives Hives and Rash    Objective:    Vital Signs:   Temp:  [97.3 F (36.3 C)-97.6 F (36.4 C)] 97.3 F (36.3 C) (12/20 1133) Pulse Rate:  [51-66] 62 (12/20 1133) Resp:  [18-29] 19 (12/20 1133) BP: (100-135)/(77-99) 103/77 (12/20 1133) SpO2:  [97 %-100 %] 100 % (12/20 1133) Weight:  [66.2 kg-69.8 kg] 69.8 kg (12/20 0415) Last BM Date: 08/16/18  Weight change: Filed Weights   08/17/18 1530 08/17/18 1910 08/18/18 0415  Weight: 66.2 kg 69.3 kg 69.8 kg    Intake/Output:   Intake/Output Summary (Last 24 hours) at 08/18/2018 1401 Last data filed at 08/18/2018 1312 Gross per 24 hour  Intake 800 ml  Output 500 ml  Net 300 ml      Physical Exam    General:  Appears chronically ill.  No resp difficulty HEENT: normal Neck: supple. JVP to the jaw  Carotids 2+ bilat; no bruits. No lymphadenopathy or thyromegaly appreciated. Cor: PMI nondisplaced. Regular rate & rhythm. No rubs, gallops. 2/6 HSM LLSB Lungs: clear Abdomen: soft, nontender, nondistended. No hepatosplenomegaly. No bruits or masses. Good bowel sounds. Extremities: no cyanosis, clubbing, rash, edema Neuro: alert & orientedx3, cranial nerves grossly intact. moves all 4 extremities w/o difficulty. Affect flat   Telemetry   A-V paced 62 bpm   EKG   Sinus Arrhythmia 71 bpm   Labs   Basic Metabolic Panel: Recent Labs  Lab 08/17/18 1332 08/18/18 0701  NA 136 139  K 4.6 3.8  CL 101 103  CO2 21* 24  GLUCOSE 105* 94  BUN 27* 27*  CREATININE 1.86* 1.79*  CALCIUM 9.2 9.1    Liver Function Tests: Recent Labs  Lab 08/17/18 1332 08/18/18 0204  AST 31 29  ALT 13 12  ALKPHOS 83 78  BILITOT 3.4* 3.1*  PROT 7.2 6.9  ALBUMIN 3.9 3.3*   No results for input(s): LIPASE, AMYLASE in the last 168 hours. No results for input(s): AMMONIA in the last 168 hours.  CBC: Recent Labs  Lab 08/17/18 1332 08/18/18 0701  WBC 3.5* 3.2*  NEUTROABS 1.9 1.8  HGB 13.4 12.6  HCT 42.8 40.3  MCV 91.1 90.6  PLT 138* 121*    Cardiac Enzymes: Recent Labs  Lab 08/17/18 12/18/15 08/18/18 0204 08/18/18 0701  TROPONINI 0.29* 0.28* 0.25*    BNP: BNP (last 3 results) Recent Labs    07/18/18 1721 08/17/18 1332  BNP 1,897.3* 2,006.0*    ProBNP (last 3 results) No results for input(s): PROBNP in the last 8760 hours.   CBG: Recent Labs  Lab 08/17/18 12/18/22 08/18/18 0814 08/18/18 0949 08/18/18 1130  GLUCAP 162* 67* 100* 135*    Coagulation Studies: Recent Labs    08/17/18 12-18-2015  LABPROT 19.9*  INR 1.71     Imaging   Dg Chest 2 View  Result Date: 08/17/2018 CLINICAL DATA:  Short of breath.  History of CHF EXAM: CHEST - 2 VIEW COMPARISON:  07/18/2018 FINDINGS: Cardiac enlargement. Three lead  pacemaker unchanged. Negative for heart failure. Negative for pneumonia or effusion. Right shoulder replacement. Advanced degenerative change left shoulder. IMPRESSION: No active cardiopulmonary disease. Electronically Signed   By: Franchot Gallo M.D.   On: 08/17/2018 15:11   Nm Pulmonary Vent And Perf (v/q Scan)  Result Date: 08/17/2018 CLINICAL DATA:  Shortness of breath. EXAM: NUCLEAR MEDICINE VENTILATION - PERFUSION LUNG SCAN TECHNIQUE: Ventilation images were obtained in multiple projections using inhaled aerosol Tc-16m DTPA. Perfusion images were obtained in multiple projections after intravenous injection of Tc-28m MAA. RADIOPHARMACEUTICALS:  31.2 mCi of Tc-48m DTPA aerosol inhalation and 4.19 mCi Tc25m MAA IV COMPARISON:  Chest x-ray August 17, 2018 FINDINGS: Ventilation: No focal ventilation defect. Perfusion: No wedge shaped peripheral perfusion defects to suggest acute pulmonary embolism. IMPRESSION: No unmatched defects.  Very low probability V/Q scan. Electronically Signed   By: Dorise Bullion III M.D   On: 08/17/2018 18:44      Medications:     Current Medications: . apixaban  2.5 mg Oral BID  . carvedilol  3.125 mg Oral BID WC  . cyproheptadine  4 mg Oral QHS  . furosemide  40 mg Intravenous BID  . insulin aspart  0-9 Units Subcutaneous TID WC  . pantoprazole  40 mg Oral QHS     Infusions:     Patient Profile   Kim Duty is an 84 year old with history of chronic systolic heart failure, NICM, complete heart block with medtronic CRT-P, OSA, PAF, TIA, DMII, GERD, CKD Stage IV, and TTR amyloidosis. She has been on bumex because torsemide was to expensive.    Assessment/Plan   1. A/C Diastolic/Systolic  Heart Failure-->Worsening RV Failure on recent RHC.  NICM. ECHO 07/21/2018 EF 35-40% severe TR  Volume overloaded. Sluggish diuresis with 40 mg IV lasix. Will increase to 120 mg IV lasix twice a day. This is what was effective last admit. If poor response will add  milrinone to augment diuresis.  Continue low dose carvedilol 3.125 mg twice a day.  No ARB/Spiro with elevated creatinine.   2. Cardiac Amyloid PYP highly suggestive of TTR. Process started for tafamidis.   3. PAF Device interrogation showed intermitttent Atrial Arrhythmia . Continue low dose carvedilol.  Continue eliquis 2.5  mg twice a day. Lower dose with age >30  and creatinine > 1.5..   4. CKD Stage IV  Creatinine baseline 1.8-2 Todays creatinine is 1.79 .   5. H/O CHB Has medtronic CRT-P  Biv pacing 98% of the time  She will likely benefit from  goals of care conversation with recent admits and biventricular heart failure.    Medication concerns reviewed with patient and pharmacy team. Barriers identified: none.   Length of Stay: 0  Darrick Grinder, NP  08/18/2018, 2:01 PM  Advanced Heart Failure Team Pager [phone] (M-F; 7a - 4p)  Please contact [facility] for night-coverage after hours (4p -7a ) and weekends on [email or web address]  Patient seen and examined with the above-signed Advanced Practice Provider and/or Housestaff. I personally reviewed laboratory data, imaging studies and relevant notes. I independently examined the patient and formulated the important aspects of the plan. I have edited the note to reflect any of my changes or salient points. I have personally discussed the plan with the patient and/or family.  84 y/o woman with systolic HF with EF 97-67% in setting of recently diagnosed TTR amyloid, CKD IV and CHB s/p BIV pacing. Now readmitted with recurrent volume overload.   On exam JVP to ear with prominent v waves.  Mild basilar crackles.  Ab soft NT Ext warm no edema  She has recurrent volume overload. Agree with IV lasix. She appears to have very narrow euvolemic window and I am concerned that we may continue to struggle with her volume status. Will assess response to IV lasix. May need occasional metolazone at home. Watch renal function closely.  Glori Bickers, MD  5:39 PM

## 2018-08-18 NOTE — Evaluation (Signed)
Physical Therapy Evaluation Patient Details Name: Kim Mullins MRN: [id number] DOB: [birth year] Today's Date: 08/18/2018   History of Present Illness  Pt is an 84 y.o. female admitted 07/18/18 from follow-up with cardiologist who noticed increased creatinine; worked up for worsening renal failure in setting of acute CHF exacerbation and recent increase in diuretic use. S/p RHC 11/25. PMH includes HF, CKD, CVA, HTN, a-fib, OSA, OA.  Clinical Impression  Patient presents with decreased independence with mobility due to generalized weakness from immobility and mild SOB.  Feel she will benefit from skilled PT in the acute setting to allow return home with family support and follow up HHPT. Encouraged use of BSC with nursing assistance and purewick at night only for more OOB mobility.      Follow Up Recommendations Home health PT;Supervision/Assistance - 24 hour    Equipment Recommendations  None recommended by PT    Recommendations for Other Services       Precautions / Restrictions Precautions Precautions: Fall      Mobility  Bed Mobility Overal bed mobility: Modified Independent                Transfers Overall transfer level: Needs assistance Equipment used: Rolling walker (2 wheeled) Transfers: Sit to/from Stand Sit to Stand: Supervision         General transfer comment: increased time, two attempts to stand, pushing up from bed appropriately, but going to sit on bed landed with uncontrolled dsecent  Ambulation/Gait Ambulation/Gait assistance: Supervision;Min guard Gait Distance (Feet): 90 Feet Assistive device: Rolling walker (2 wheeled) Gait Pattern/deviations: Step-through pattern;Antalgic;Decreased stride length     General Gait Details: slower pace, some hesitancy, no LOB this session, fall risk noted with some mild antalgia on L  Stairs            Wheelchair Mobility    Modified Rankin (Stroke Patients Only)       Balance Overall  balance assessment: Needs assistance   Sitting balance-Leahy Scale: Good     Standing balance support: No upper extremity supported;During functional activity Standing balance-Leahy Scale: Poor Standing balance comment: some posterior bias noted when squatting to pull up briefs in standing after toileting                             Pertinent Vitals/Pain Pain Assessment: Faces Faces Pain Scale: Hurts little more Pain Location: L knee with certain movements, like sit to stand Pain Descriptors / Indicators: Grimacing Pain Intervention(s): Monitored during session;Repositioned    Home Living Family/patient expects to be discharged to:: Private residence Living Arrangements: Alone Available Help at Discharge: Family;Available 24 hours/day(initially) Type of Home: House Home Access: Stairs to enter   [employer] of Steps: 1 Home Layout: One level Home Equipment: Walker - 2 wheels;Walker - 4 wheels;Cane - single point;Bedside commode;Shower seat;Toilet riser      Prior Function Level of Independence: Independent with assistive device(s)         Comments: uses rollator versus walker, drives still and grocery shops with help of a friend.  Grandaughter reports pt does cook, etc, but difficult and tires easliy.  Has had multiple falls at home     Hand Dominance   Dominant Hand: Right    Extremity/Trunk Assessment   Upper Extremity Assessment Upper Extremity Assessment: Generalized weakness    Lower Extremity Assessment Lower Extremity Assessment: Generalized weakness    Cervical / Trunk Assessment Cervical / Trunk Assessment: Kyphotic  Communication  Communication: HOH  Cognition Arousal/Alertness: Awake/alert Behavior During Therapy: WFL for tasks assessed/performed Overall Cognitive Status: History of cognitive impairments - at baseline                                 General Comments: grandaughter in room and shaking her head  when pt answering for herself about difficulty with daily tasks as pt relates no difficulties       General Comments General comments (skin integrity, edema, etc.): Grandaughter in room reports she will be with her for a while and may go visit out of state family if MD will allow. Great difficulty getting SpO2 reading on pt's finger but ambulated on RA and no increased SOB noted, but mildly dyspneic at rest.  RN placed dynamap on finger and over time read 92% with HR correlating better with telemetry, but still not consistent wave form.     Exercises     Assessment/Plan    PT Assessment Patient needs continued PT services  PT Problem List Decreased balance;Decreased activity tolerance;Decreased mobility;Decreased knowledge of precautions       PT Treatment Interventions DME instruction;Functional mobility training;Balance training;Patient/family education;Gait training;Therapeutic activities;Therapeutic exercise    PT Goals (Current goals can be found in the Care Plan section)  Acute Rehab PT Goals Patient Stated Goal: to go home PT Goal Formulation: With patient/family Time For Goal Achievement: 08/25/18 Potential to Achieve Goals: Good    Frequency Min 3X/week   Barriers to discharge        Co-evaluation               AM-PAC PT "6 Clicks" Mobility  Outcome Measure   Help needed moving from lying on your back to sitting on the side of a flat bed without using bedrails?: A Little Help needed moving to and from a bed to a chair (including a wheelchair)?: A Little Help needed standing up from a chair using your arms (e.g., wheelchair or bedside chair)?: A Little Help needed to walk in hospital room?: A Little Help needed climbing 3-5 steps with a railing? : A Little 6 Click Score: 15    End of Session Equipment Utilized During Treatment: Gait belt Activity Tolerance: Patient tolerated treatment well Patient left: with call bell/phone within reach;in bed(sitting EOB  with grandaughter in room)   PT Visit Diagnosis: Other abnormalities of gait and mobility (R26.89);History of falling (Z91.81)    Time: 5790-3833 PT Time Calculation (min) (ACUTE ONLY): 41 min   Charges:   PT Evaluation $PT Eval Moderate Complexity: 1 Mod PT Treatments $Gait Training: 8-22 mins $Therapeutic Activity: 8-22 mins        Magda Kiel, PT Acute Rehabilitation Services [phone] 08/18/2018   Reginia Naas 08/18/2018, 2:12 PM

## 2018-08-18 NOTE — Progress Notes (Addendum)
[facility] PROGRESS NOTE  Kim Mullins CHY:[id number] DOB: [birth year] DOA: 08/17/2018 PCP: Lin Landsman, MD  Assessment/Plan:  Acute respiratory failure with hypoxia secondary to acute on chronic systolic and diastolic heart failure. Oxygen saturation level 89% on room air. Chest xray with cardiac enlargement, negative for heart failure or effusion or pneumonia. BNP 2006. Physical exam consistent -improved this am -oxygen saturation level greater than 90% on 2L -wean oxygen as able -continue IV lasix -mobilize patient   Acute on chronic combined systolic and diastolic heart failure (HCC) in setting of Cardiac Amyloid TTR. RHV 11/19 -echo 11/19 with moderate LVH, ef 35% and akinesis of basal midinferolateral myocardium, severe TR -Volume status -400 -continue IV lasix -Monitor daily weights, renal function and electrolytes.  -Continue carvedilol 3.125 mg p.o. twice daily. -of note, last hospitalization she required milrinone which was discontinued before discharge secondary to worsening renal function. Also required assistance with cards and nephrology to diurese well.   Active Problems:   Benign essential HTN -fair control -continue carvedilol and furosemide -Monitor BP, HR, renal function electrolytes.    OSA (obstructive sleep apnea) CPAP at bedtime.    Atrial fibrillation (HCC) CHA?DS?-VASc Score of at least 7. Continue apixaban. Continue carvedilol 3.125 mg p.o. twice daily for rate control.    GERD (gastroesophageal reflux disease) Protonix 40 mg p.o. daily    Elevated troponin -likely related to demand in setting of recent cardiac catheterization. -elevated but flat -ekg without acute changes    Glaucoma Continue Xalatan drops at bedtime. Continue timolol drops daily.  Hyperbilirubinemia Worsen from October when he was 2.7.  -trending down today -  Fatty liver on [place] in October. No gallstones or CBD obstruction on RUQ US done in  October.  Chronic kidney disease III. Baseline creatinine 1.5-1.7. currently 1.79. chart review indicates nephrology recently evaluated and opined cadiorenal/hemodynamically mediated with diuretic resistance.  -will monitor closely  Cardiac amyloidosis. Chart review indicates plan for starting Tafamidis depending on goals of care. -Palliative cared consult  Code Status: full Family Communication: none present Disposition Plan: home when ready   Consultants:  none  Procedures:  none  Antibiotics:    HPI/Subjective: Pt with hx/o CHF, CKD, HTN presented with  sudden onset shortness of breath since last night.  Concern for CHF exacerbation. Respiratory distress, hypoxia, tachypnea, elevated BNP.   Improving this am with IV lasix and supplemental oxygen but remains with some increased work of breathing  Objective: Vitals:   08/18/18 0955 08/18/18 0956  BP: (!) 135/99   Pulse: (!) 51 66  Resp:    Temp:    SpO2: 99%     Intake/Output Summary (Last 24 hours) at 08/18/2018 1126 Last data filed at 08/18/2018 1009 Gross per 24 hour  Intake 680 ml  Output 500 ml  Net 180 ml   Filed Weights   08/17/18 1530 08/17/18 1910 08/18/18 0415  Weight: 66.2 kg 69.3 kg 69.8 kg    Exam:   General:  Well nourished alert oriented  Cardiovascular: RRR no mgr 1+ LE edema   Respiratory: mild increased work of breathing with conversation. BS somewhat diminished with fine crackles bases no wheeze  Abdomen: soft +BS no guarding or rebounding  Musculoskeletal: joints without swelling/erythema   Data Reviewed: Basic Metabolic Panel: Recent Labs  Lab 08/17/18 1332 08/18/18 0701  NA 136 139  K 4.6 3.8  CL 101 103  CO2 21* 24  GLUCOSE 105* 94  BUN 27* 27*  CREATININE 1.86* 1.79*  CALCIUM 9.2  9.1   Liver Function Tests: Recent Labs  Lab 08/17/18 1332 08/18/18 0204  AST 31 29  ALT 13 12  ALKPHOS 83 78  BILITOT 3.4* 3.1*  PROT 7.2 6.9  ALBUMIN 3.9 3.3*   No  results for input(s): LIPASE, AMYLASE in the last 168 hours. No results for input(s): AMMONIA in the last 168 hours. CBC: Recent Labs  Lab 08/17/18 1332 08/18/18 0701  WBC 3.5* 3.2*  NEUTROABS 1.9 1.8  HGB 13.4 12.6  HCT 42.8 40.3  MCV 91.1 90.6  PLT 138* 121*   Cardiac Enzymes: Recent Labs  Lab 08/17/18 2017 08/18/18 0204 08/18/18 0701  TROPONINI 0.29* 0.28* 0.25*   BNP (last 3 results) Recent Labs    07/18/18 1721 08/17/18 1332  BNP 1,897.3* 2,006.0*    ProBNP (last 3 results) No results for input(s): PROBNP in the last 8760 hours.  CBG: Recent Labs  Lab 08/17/18 2124 08/18/18 0949  GLUCAP 162* 100*    No results found for this or any previous visit (from the past 240 hour(s)).   Studies: Dg Chest 2 View  Result Date: 08/17/2018 CLINICAL DATA:  Short of breath.  History of CHF EXAM: CHEST - 2 VIEW COMPARISON:  07/18/2018 FINDINGS: Cardiac enlargement. Three lead pacemaker unchanged. Negative for heart failure. Negative for pneumonia or effusion. Right shoulder replacement. Advanced degenerative change left shoulder. IMPRESSION: No active cardiopulmonary disease. Electronically Signed   By: Franchot Gallo M.D.   On: 08/17/2018 15:11   Nm Pulmonary Vent And Perf (v/q Scan)  Result Date: 08/17/2018 CLINICAL DATA:  Shortness of breath. EXAM: NUCLEAR MEDICINE VENTILATION - PERFUSION LUNG SCAN TECHNIQUE: Ventilation images were obtained in multiple projections using inhaled aerosol Tc-67m DTPA. Perfusion images were obtained in multiple projections after intravenous injection of Tc-64m MAA. RADIOPHARMACEUTICALS:  31.2 mCi of Tc-12m DTPA aerosol inhalation and 4.19 mCi Tc43m MAA IV COMPARISON:  Chest x-ray August 17, 2018 FINDINGS: Ventilation: No focal ventilation defect. Perfusion: No wedge shaped peripheral perfusion defects to suggest acute pulmonary embolism. IMPRESSION: No unmatched defects.  Very low probability V/Q scan. Electronically Signed   By: Dorise Bullion III M.D   On: 08/17/2018 18:44    Scheduled Meds: . apixaban  2.5 mg Oral BID  . carvedilol  3.125 mg Oral BID WC  . cyproheptadine  4 mg Oral QHS  . furosemide  40 mg Intravenous BID  . insulin aspart  0-9 Units Subcutaneous TID WC  . pantoprazole  40 mg Oral QHS   Continuous Infusions:  Principal Problem:   Acute respiratory failure with hypoxia (HCC) Active Problems:   Benign essential HTN   Atrial fibrillation (HCC)   Acute on chronic combined systolic and diastolic heart failure (HCC)   Elevated troponin   Hyperbilirubinemia   OSA (obstructive sleep apnea)   GERD (gastroesophageal reflux disease)   Glaucoma    Time spent: 50 minutes    [facility]  If 7PM-7AM, please contact night-coverage at [email or web address], password [facility] 08/18/2018, 11:26 AM  LOS: 0 days

## 2018-08-18 NOTE — Care Management Note (Signed)
Case Management Note  Patient Details  Name: Kim Mullins MRN: [id number] Date of Birth: 1929-10-06  Subjective/Objective:      CHF             Action/Plan: Patient lives at home; PCP: Lin Landsman, MD; has private insurance with BCBS with prescription drug coverage; patient is active with [facility] as prior to admission; CM will continue to follow for progression of care.  Expected Discharge Date:    possibly 08/22/2018              Expected Discharge Plan:  [place]  Discharge planning Services  CM Consult  HH Arranged:  OT, RN, PT [facility] Agency:  [place]  Status of Service:  In process, will continue to follow  Sherrilyn Rist [phone] 08/18/2018, 1:02 PM

## 2018-08-19 DIAGNOSIS — J9601 Acute respiratory failure with hypoxia: Secondary | ICD-10-CM | POA: Diagnosis not present

## 2018-08-19 DIAGNOSIS — Z515 Encounter for palliative care: Secondary | ICD-10-CM

## 2018-08-19 DIAGNOSIS — I482 Chronic atrial fibrillation, unspecified: Secondary | ICD-10-CM | POA: Diagnosis present

## 2018-08-19 DIAGNOSIS — Z7189 Other specified counseling: Secondary | ICD-10-CM | POA: Diagnosis not present

## 2018-08-19 DIAGNOSIS — G4733 Obstructive sleep apnea (adult) (pediatric): Secondary | ICD-10-CM | POA: Diagnosis present

## 2018-08-19 DIAGNOSIS — Z7901 Long term (current) use of anticoagulants: Secondary | ICD-10-CM | POA: Diagnosis not present

## 2018-08-19 DIAGNOSIS — I509 Heart failure, unspecified: Secondary | ICD-10-CM

## 2018-08-19 DIAGNOSIS — Z833 Family history of diabetes mellitus: Secondary | ICD-10-CM | POA: Diagnosis not present

## 2018-08-19 DIAGNOSIS — E859 Amyloidosis, unspecified: Secondary | ICD-10-CM | POA: Diagnosis not present

## 2018-08-19 DIAGNOSIS — J45909 Unspecified asthma, uncomplicated: Secondary | ICD-10-CM | POA: Diagnosis present

## 2018-08-19 DIAGNOSIS — Z79899 Other long term (current) drug therapy: Secondary | ICD-10-CM | POA: Diagnosis not present

## 2018-08-19 DIAGNOSIS — I472 Ventricular tachycardia: Secondary | ICD-10-CM | POA: Diagnosis present

## 2018-08-19 DIAGNOSIS — N189 Chronic kidney disease, unspecified: Secondary | ICD-10-CM

## 2018-08-19 DIAGNOSIS — E854 Organ-limited amyloidosis: Secondary | ICD-10-CM | POA: Diagnosis present

## 2018-08-19 DIAGNOSIS — R0602 Shortness of breath: Secondary | ICD-10-CM | POA: Diagnosis present

## 2018-08-19 DIAGNOSIS — J9690 Respiratory failure, unspecified, unspecified whether with hypoxia or hypercapnia: Secondary | ICD-10-CM

## 2018-08-19 DIAGNOSIS — Z8249 Family history of ischemic heart disease and other diseases of the circulatory system: Secondary | ICD-10-CM | POA: Diagnosis not present

## 2018-08-19 DIAGNOSIS — I248 Other forms of acute ischemic heart disease: Secondary | ICD-10-CM | POA: Diagnosis present

## 2018-08-19 DIAGNOSIS — I43 Cardiomyopathy in diseases classified elsewhere: Secondary | ICD-10-CM | POA: Diagnosis present

## 2018-08-19 DIAGNOSIS — K760 Fatty (change of) liver, not elsewhere classified: Secondary | ICD-10-CM | POA: Diagnosis present

## 2018-08-19 DIAGNOSIS — Z96641 Presence of right artificial hip joint: Secondary | ICD-10-CM | POA: Diagnosis present

## 2018-08-19 DIAGNOSIS — Z90710 Acquired absence of both cervix and uterus: Secondary | ICD-10-CM | POA: Diagnosis not present

## 2018-08-19 DIAGNOSIS — E1122 Type 2 diabetes mellitus with diabetic chronic kidney disease: Secondary | ICD-10-CM | POA: Diagnosis present

## 2018-08-19 DIAGNOSIS — I420 Dilated cardiomyopathy: Secondary | ICD-10-CM | POA: Diagnosis present

## 2018-08-19 DIAGNOSIS — I5043 Acute on chronic combined systolic (congestive) and diastolic (congestive) heart failure: Secondary | ICD-10-CM | POA: Diagnosis not present

## 2018-08-19 DIAGNOSIS — Z96611 Presence of right artificial shoulder joint: Secondary | ICD-10-CM | POA: Diagnosis present

## 2018-08-19 DIAGNOSIS — N184 Chronic kidney disease, stage 4 (severe): Secondary | ICD-10-CM | POA: Diagnosis present

## 2018-08-19 DIAGNOSIS — I5023 Acute on chronic systolic (congestive) heart failure: Secondary | ICD-10-CM | POA: Diagnosis not present

## 2018-08-19 DIAGNOSIS — H409 Unspecified glaucoma: Secondary | ICD-10-CM | POA: Diagnosis present

## 2018-08-19 DIAGNOSIS — Z87891 Personal history of nicotine dependence: Secondary | ICD-10-CM | POA: Diagnosis not present

## 2018-08-19 DIAGNOSIS — Z8673 Personal history of transient ischemic attack (TIA), and cerebral infarction without residual deficits: Secondary | ICD-10-CM | POA: Diagnosis not present

## 2018-08-19 DIAGNOSIS — I130 Hypertensive heart and chronic kidney disease with heart failure and stage 1 through stage 4 chronic kidney disease, or unspecified chronic kidney disease: Secondary | ICD-10-CM | POA: Diagnosis present

## 2018-08-19 LAB — BASIC METABOLIC PANEL
ANION GAP: 11 (ref 5–15)
BUN: 25 mg/dL — ABNORMAL HIGH (ref 8–23)
CO2: 25 mmol/L (ref 22–32)
Calcium: 9 mg/dL (ref 8.9–10.3)
Chloride: 103 mmol/L (ref 98–111)
Creatinine, Ser: 1.81 mg/dL — ABNORMAL HIGH (ref 0.44–1.00)
GFR calc Af Amer: 29 mL/min — ABNORMAL LOW (ref >60)
GFR calc non Af Amer: 25 mL/min — ABNORMAL LOW (ref >60)
Glucose, Bld: 94 mg/dL (ref 70–99)
Potassium: 3.4 mmol/L — ABNORMAL LOW (ref 3.5–5.1)
Sodium: 139 mmol/L (ref 135–145)

## 2018-08-19 LAB — GLUCOSE, CAPILLARY
GLUCOSE-CAPILLARY: 87 mg/dL (ref 70–99)
Glucose-Capillary: 80 mg/dL (ref 70–99)
Glucose-Capillary: 126 mg/dL — ABNORMAL HIGH (ref 70–99)

## 2018-08-19 LAB — HEPATIC FUNCTION PANEL
ALBUMIN: 3.2 g/dL — AB (ref 3.5–5.0)
ALT: 12 U/L (ref 0–44)
AST: 25 U/L (ref 15–41)
Alkaline Phosphatase: 71 U/L (ref 38–126)
Bilirubin, Direct: 0.9 mg/dL — ABNORMAL HIGH (ref 0.0–0.2)
Indirect Bilirubin: 2.7 mg/dL — ABNORMAL HIGH (ref 0.3–0.9)
Total Bilirubin: 3.6 mg/dL — ABNORMAL HIGH (ref 0.3–1.2)
Total Protein: 6.6 g/dL (ref 6.5–8.1)

## 2018-08-19 MED ORDER — POTASSIUM CHLORIDE CRYS ER 20 MEQ PO TBCR
40.0000 meq | EXTENDED_RELEASE_TABLET | Freq: Once | ORAL | Status: AC
  Administered 2018-08-19: 40 meq via ORAL
  Filled 2018-08-19: qty 2

## 2018-08-19 MED ORDER — POLYETHYLENE GLYCOL 3350 17 G PO PACK
17.0000 g | PACK | Freq: Every day | ORAL | Status: DC
  Filled 2018-08-19: qty 1

## 2018-08-19 MED ORDER — SODIUM CHLORIDE 0.9 % IV SOLN
INTRAVENOUS | Status: DC | PRN
  Administered 2018-08-19: 250 mL via INTRAVENOUS

## 2018-08-19 NOTE — Progress Notes (Signed)
Advanced Heart Failure Rounding Note   Subjective:     Diuresing well with IV lasix. Says she thinks she is breathing better. Denies orthopnea or PND.   Creatinine stable at 1.8  Several brief runs NSVT on tele. Asymptomatic.   Objective:   Weight Range:  Vital Signs:   Temp:  [97.3 F (36.3 C)-98.3 F (36.8 C)] 97.5 F (36.4 C) (12/21 0417) Pulse Rate:  [59-74] 74 (12/21 0919) Resp:  [18-19] 18 (12/21 0417) BP: (92-148)/(66-78) 148/78 (12/21 0919) SpO2:  [92 %-100 %] 97 % (12/21 0417) Weight:  [68.3 kg] 68.3 kg (12/21 0417) Last BM Date: 08/16/18  Weight change: Filed Weights   08/17/18 1910 08/18/18 0415 08/19/18 0417  Weight: 69.3 kg 69.8 kg 68.3 kg    Intake/Output:   Intake/Output Summary (Last 24 hours) at 08/19/2018 1129 Last data filed at 08/19/2018 0900 Gross per 24 hour  Intake 902 ml  Output 1000 ml  Net -98 ml     Physical Exam: General:  Elderly woman lying flat in bed  No resp difficulty HEENT: normal Neck: supple. JVP 9-10  Carotids 2+ bilat; no bruits. No lymphadenopathy or thryomegaly appreciated. Cor: PMI nondisplaced. Regular rate & rhythm. No rubs, gallops or murmurs. Lungs: clear Abdomen: soft, nontender, nondistended. No hepatosplenomegaly. No bruits or masses. Good bowel sounds. Extremities: no cyanosis, clubbing, rash, edema Neuro: alert & orientedx3, cranial nerves grossly intact. moves all 4 extremities w/o difficulty. Affect pleasant   Telemetry: NSR 70-80s several brief runs NSVT. Personally reviewed   Labs: Basic Metabolic Panel: Recent Labs  Lab 08/17/18 1332 08/18/18 0701 08/19/18 0440  NA 136 139 139  K 4.6 3.8 3.4*  CL 101 103 103  CO2 21* 24 25  GLUCOSE 105* 94 94  BUN 27* 27* 25*  CREATININE 1.86* 1.79* 1.81*  CALCIUM 9.2 9.1 9.0    Liver Function Tests: Recent Labs  Lab 08/17/18 1332 08/18/18 0204 08/19/18 0440  AST 31 29 25   ALT 13 12 12   ALKPHOS 83 78 71  BILITOT 3.4* 3.1* 3.6*  PROT 7.2  6.9 6.6  ALBUMIN 3.9 3.3* 3.2*   No results for input(s): LIPASE, AMYLASE in the last 168 hours. No results for input(s): AMMONIA in the last 168 hours.  CBC: Recent Labs  Lab 08/17/18 1332 08/18/18 0701  WBC 3.5* 3.2*  NEUTROABS 1.9 1.8  HGB 13.4 12.6  HCT 42.8 40.3  MCV 91.1 90.6  PLT 138* 121*    Cardiac Enzymes: Recent Labs  Lab 08/17/18 2017 08/18/18 0204 08/18/18 0701  TROPONINI 0.29* 0.28* 0.25*    BNP: BNP (last 3 results) Recent Labs    07/18/18 1721 08/17/18 1332  BNP 1,897.3* 2,006.0*    ProBNP (last 3 results) No results for input(s): PROBNP in the last 8760 hours.    Other results:  Imaging: Dg Chest 2 View  Result Date: 08/17/2018 CLINICAL DATA:  Short of breath.  History of CHF EXAM: CHEST - 2 VIEW COMPARISON:  07/18/2018 FINDINGS: Cardiac enlargement. Three lead pacemaker unchanged. Negative for heart failure. Negative for pneumonia or effusion. Right shoulder replacement. Advanced degenerative change left shoulder. IMPRESSION: No active cardiopulmonary disease. Electronically Signed   By: Franchot Gallo M.D.   On: 08/17/2018 15:11   Nm Pulmonary Vent And Perf (v/q Scan)  Result Date: 08/17/2018 CLINICAL DATA:  Shortness of breath. EXAM: NUCLEAR MEDICINE VENTILATION - PERFUSION LUNG SCAN TECHNIQUE: Ventilation images were obtained in multiple projections using inhaled aerosol Tc-67m DTPA. Perfusion images were obtained  in multiple projections after intravenous injection of Tc-12m MAA. RADIOPHARMACEUTICALS:  31.2 mCi of Tc-20m DTPA aerosol inhalation and 4.19 mCi Tc40m MAA IV COMPARISON:  Chest x-ray August 17, 2018 FINDINGS: Ventilation: No focal ventilation defect. Perfusion: No wedge shaped peripheral perfusion defects to suggest acute pulmonary embolism. IMPRESSION: No unmatched defects.  Very low probability V/Q scan. Electronically Signed   By: Dorise Bullion III M.D   On: 08/17/2018 18:44      Medications:     Scheduled  Medications: . apixaban  2.5 mg Oral BID  . carvedilol  3.125 mg Oral BID WC  . cyproheptadine  4 mg Oral QHS  . docusate sodium  100 mg Oral Daily  . insulin aspart  0-9 Units Subcutaneous TID WC  . pantoprazole  40 mg Oral QHS     Infusions: . sodium chloride 250 mL (08/19/18 1036)  . furosemide 120 mg (08/19/18 1037)     PRN Medications:  sodium chloride, acetaminophen **OR** acetaminophen, ondansetron **OR** ondansetron (ZOFRAN) IV   Assessment:   Kim Mullins is an 84 year old with history of chronic systolic heart failure, NICM, complete heart block with medtronic CRT-P, OSA, PAF, TIA, DMII, GERD, CKD Stage IV, and TTR amyloidosis. She has been on bumex because torsemide was to expensive.   Plan/Discussion:    1. A/C Diastolic/Systolic  Heart Failure-->Worsening RV Failure on recent RHC.  - NICM in setting of TTR cardiac amyloidosis. ECHO 07/21/2018 EF 35-40% severe TR  - Remains volume overloaded but improving with alsix 120 IV bid. Will continue for one more day - Recent RHC with adequate output so do not think there is any role for inotropes currently -Continue low dose carvedilol 3.125 mg twice a day.  -No ARB/Spiro with elevated creatinine.  - watch renal function closely.  - seems to have very narrow euvolemic window and I am concerned that we may continue to struggle with her volume status. May need occasional metolazone at home. - agree with Palliative Care Consult for [place]. I spoke with their team today as well.    2. 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 Process started for tafamidis.   3. PAF -Device interrogation showed intermitttent Atrial Arrhythmia . -Continue low dose carvedilol.  - Continue eliquis 2.5  mg twice a day. Lower dose with age >62  and creatinine > 1.5..   4. CKD Stage IV  -Creatinine baseline 1.8-2 -Todays creatinine is 1.8. Follow with diuresis  5. H/O CHB Has medtronic CRT-P  Biv pacing 98% of the time  6. NSVT -  keep K > 4.0, Mg > 2.0  7. Hypokalemia - supp   Length of Stay: 0   Glori Bickers MD 08/19/2018, 11:29 AM  Advanced Heart Failure Team Pager [phone] (M-F; [place])  Please contact [facility] for night-coverage after hours (4p -7a ) and weekends on [email or web address]

## 2018-08-19 NOTE — Consult Note (Signed)
Consultation Note Date: 08/19/2018   Patient Name: Kim Mullins  DOB: 06/12/1930  MRN: [id number]  Age / Sex: 84 y.o., female  PCP: Lin Landsman, MD Referring Physician: Geradine Girt, DO  Reason for Consultation: Establishing goals of care and Psychosocial/spiritual support  HPI/Patient Profile: 84 y.o. female  with past medical history of combined heart failure ( EF 30-35%), amyloidosis, diabetes type 2, GERD, chronic kidney disease stage IV, obstructive sleep apnea on CPAP, atrial fib, pacemaker admitted on 08/17/2018 with abdominal pain and shortness of breath.  Patient is followed in the heart failure clinic and has recently had difficulty with fluid volume balance.  Consult ordered for goals of care..   Clinical Assessment and Goals of Care: Patient seen, chart reviewed.  Patient's granddaughter Lillia Corporal is at the bedside.  She is her healthcare power of attorney.  She is in from [place].  Patient is currently denying any shortness of breath but states she has had "a weird feeling in my stomach that scared me".  She seems more focused on what is going on in her abdomen than her breathing at the moment.  She describes the feeling as intermittent.  She is unable to articulate whether eating makes the pain better or worse; no vomiting.  We also discussed the role of palliative medicine as an additional resource and source of support in the hospital.  Provided introductory information on congestive heart failure.  Compared and contrasted palliative medicine services with hospice.  Also discussed aggressive treatment trajectories versus symptom/comfort based care trajectories.  Patient reports that she does have advanced directives but has not brought them to the hospital.  Discussed and explained terms of full code versus DNR.  Patient is a full code by choice  Patient is able to speak for  herself at this point.  Where she is unable to, her granddaughter, Lillia Corporal at [phone], would be her healthcare proxy  SUMMARY OF RECOMMENDATIONS   Patient is a full code by choice Introduction to goals of care discussion started by palliative medicine representative Hard choices for loving People booklet left with granddaughter and patient Patient would benefit from ongoing goals of care discussion in the outpatient setting.  Those can be accessed either through [facility] at [phone] or [facility] at [phone].  Code Status/Advance Care Planning:  Full code    Symptom Management:   Abdominal discomfort: Not sure what to make of her reports as they were vague but apparently of significance that she described them as frightening.  She does not report nausea or vomiting or pain.  Continue to monitor  Palliative Prophylaxis:   Aspiration, Bowel Regimen, Delirium Protocol, Eye Care, Frequent Pain Assessment, Oral Care and Turn Reposition  Additional Recommendations (Limitations, Scope, Preferences):  Full Scope Treatment  Psycho-social/Spiritual:   Desire for further Chaplaincy support:no  Additional Recommendations: Referral to Community Resources   Prognosis:   Unable to determine  Discharge Planning: Home with [place]  Primary Diagnoses: Present on Admission: . Acute on chronic combined systolic and diastolic heart failure ([place]) . Atrial fibrillation ([place]) . Benign essential HTN . OSA (obstructive sleep apnea) . GERD (gastroesophageal reflux disease) . Elevated troponin . Glaucoma . Hyperbilirubinemia . Acute respiratory failure with hypoxia ([place])   I have reviewed the medical record, interviewed the patient and family, and examined the patient. The following aspects are pertinent.  Past Medical History:  Diagnosis Date  . Anemia    occassionally  . Arthritis     all over.  . Asthma    Allergixc reaction to cats.  . Atrial fibrillation ([place])   . Bowel obstruction ([place])   . Chronic systolic CHF (congestive heart failure) (HCC)    EF 30-35% by cath, echo 2016 EF 50%  . Complete heart block (HCC)    a. s/p MDT CRTP pacemaker  . DCM (dilated cardiomyopathy) ([place]) 08/16/2014   normal coronary arteries on cath with EF 30-45%.  EF now 50% by echo 11/2014  . Diabetes mellitus 2006   Diet and exercise controlled.  Marland Kitchen Dyspnea   . GERD (gastroesophageal reflux disease)    occ  . Glaucoma 08/17/2018  . Hypertension   . Left tibial fracture 2007  . OSA (obstructive sleep apnea) 10/23/2014   Moderate with AHI 21/hr  . Osteoarthritis of right shoulder region 06/26/2013  . Stroke [facility])    Social History   Socioeconomic History  . Marital status: Widowed    Spouse name: Not on file  . Number of children: 0  . Years of education: Not on file  . Highest education level: Doctorate  Occupational History  . Occupation: retired  Scientific laboratory technician  . Financial resource strain: Not hard at all  . Food insecurity:    Worry: Never true    Inability: Never true  . Transportation needs:    Medical: No    Non-medical: No  Tobacco Use  . Smoking status: Former Smoker    Packs/day: 1.00    Years: 45.00    Pack years: 45.00    Types: Cigarettes    Last attempt to quit: 07/24/1978    Years since quitting: 40.0  . Smokeless tobacco: Never Used  Substance and Sexual Activity  . Alcohol use: Yes    Alcohol/week: 7.0 standard drinks    Types: 7 Glasses of wine per week    Comment: every day  . Drug use: No  . Sexual activity: Not Currently  Lifestyle  . Physical activity:    Days per week: Not on file    Minutes per session: Not on file  . Stress: Not on file  Relationships  . Social connections:    Talks on phone: Not on file    Gets together: Not on file    Attends religious service: Not on file    Active member of club or organization: Not on file     Attends meetings of clubs or organizations: Not on file    Relationship status: Not on file  Other Topics Concern  . Not on file  Social History Narrative   Lives alone   Family History  Problem Relation Age of Onset  . Dementia Mother   . Coronary artery disease Mother   . Cancer Father        blood ? type  . Diabetes Maternal Grandfather   . Anuerysm Daughter        brain  . Colon cancer Neg Hx    Scheduled Meds: .  apixaban  2.5 mg Oral BID  . carvedilol  3.125 mg Oral BID WC  . cyproheptadine  4 mg Oral QHS  . docusate sodium  100 mg Oral Daily  . insulin aspart  0-9 Units Subcutaneous TID WC  . pantoprazole  40 mg Oral QHS   Continuous Infusions: . sodium chloride 250 mL (08/19/18 1036)  . furosemide 120 mg (08/19/18 1037)   PRN Meds:.sodium chloride, acetaminophen **OR** acetaminophen, ondansetron **OR** ondansetron (ZOFRAN) IV Medications Prior to Admission:  Prior to Admission medications   Medication Sig Start Date End Date Taking? Authorizing Provider  Acetaminophen (TYLENOL) 325 MG CAPS Take 650 mg by mouth daily as needed (for shoulder pain).    Yes [provider]  apixaban (ELIQUIS) 2.5 MG TABS tablet Take 1 tablet (2.5 mg total) by mouth 2 (two) times daily. 07/26/18  Yes Patrecia Pour, MD  bumetanide (BUMEX) 1 MG tablet Take 3 tablets (3 mg total) by mouth 2 (two) times daily. Patient taking differently: Take 3 mg by mouth 3 (three) times daily.  07/26/18  Yes Patrecia Pour, MD  carvedilol (COREG) 3.125 MG tablet Take 1 tablet (3.125 mg total) by mouth 2 (two) times daily with a meal. 07/26/18  Yes Patrecia Pour, MD  diclofenac sodium (VOLTAREN) 1 % GEL Apply 2 g topically 3 (three) times daily as needed (pain, inflammation). 04/16/16  Yes Emokpae, Courage, MD  latanoprost (XALATAN) 0.005 % ophthalmic solution Place 1 drop into both eyes at bedtime.  10/20/16  Yes [provider]  potassium chloride SA (K-DUR,KLOR-CON) 20 MEQ tablet Take 1  tablet (20 mEq total) by mouth 2 (two) times daily. Patient taking differently: Take 20 mEq by mouth 3 (three) times daily.  08/14/18  Yes Shirley Friar, PA-C  timolol (TIMOPTIC) 0.5 % ophthalmic solution Place 1 drop into both eyes daily.  07/29/15  Yes [provider]  glucose blood test strip Use to test blood sugar twice daily ([employer] Smart[facility]) 07/19/16   Larey Dresser, MD   Allergies  Allergen Reactions  . Hydrocodone Other (See Comments)    Took away all energy and made her stop eating food for short time  . Percocet [Oxycodone-Acetaminophen] Other (See Comments)    Too away all energy and made her stop eating food for short time  . Eggs Or Egg-Derived Products Hives and Rash  . Mercurial Derivatives Itching and Rash  . Penicillins Hives and Rash    Has patient had a PCN reaction causing immediate rash, facial/tongue/throat swelling, SOB or lightheadedness with hypotension: Yes Has patient had a PCN reaction causing severe rash involving mucus membranes or skin necrosis: No Has patient had a PCN reaction that required hospitalization: No Has patient had a PCN reaction occurring within the last 10 years: Yes If all of the above answers are "NO", then may proceed with Cephalosporin use.  . Quinine Derivatives Hives and Rash   Review of Systems  Unable to perform ROS: Other    Physical Exam Vitals signs and nursing note reviewed.  Constitutional:      Appearance: She is well-developed.  HENT:     Head: Normocephalic and atraumatic.  Neck:     Musculoskeletal: Normal range of motion.  Pulmonary:     Effort: Pulmonary effort is normal.  Skin:    General: Skin is warm and dry.  Neurological:     General: No focal deficit present.     Mental Status: She is alert and oriented to person,  place, and time.  Psychiatric:        Mood and Affect: Mood normal.     Vital Signs: BP 96/69 (BP Location: Right Arm)   Pulse 63   Temp 98.4 F (36.9 C) (Oral)    Resp 20   Ht 5\' 3"  (1.6 m)   Wt 68.3 kg Comment: scale b  SpO2 97%   BMI 26.66 kg/m  Pain Scale: 0-10   Pain Score: Asleep   SpO2: SpO2: 97 % O2 Device:SpO2: 97 % O2 Flow Rate: .O2 Flow Rate (L/min): 3 L/min  IO: Intake/output summary:   Intake/Output Summary (Last 24 hours) at 08/19/2018 1507 Last data filed at 08/19/2018 0900 Gross per 24 hour  Intake 542 ml  Output 1000 ml  Net -458 ml    LBM: Last BM Date: 08/16/18 Baseline Weight: Weight: 69 kg Most recent weight: Weight: 68.3 kg(scale b)     Palliative Assessment/Data:   Flowsheet Rows     Most Recent Value  Intake Tab  Referral Department  Hospitalist  Unit at Time of Referral  Med/Surg Unit  Palliative Care Primary Diagnosis  Cardiac  Date Notified  08/18/18  Palliative Care Type  New Palliative care  Reason for referral  Clarify Goals of Care, Psychosocial or Spiritual support  Date of Admission  08/17/18  # of days IP prior to Palliative referral  1  Clinical Assessment  Palliative Performance Scale Score  50%  Pain Max last 24 hours  0  Pain Min Last 24 hours  0  Dyspnea Max Last 24 Hours  0  Dyspnea Min Last 24 hours  0  Nausea Min Last 24 Hours  0  Anxiety Max Last 24 Hours  0  Anxiety Min Last 24 Hours  0  Other Max Last 24 Hours  0  Psychosocial & Spiritual Assessment  Palliative Care Outcomes  Patient/Family meeting held?  Yes  Who was at the meeting?  pt, and pt's granddaughter  Palliative Care Outcomes  Clarified goals of care, Provided advance care planning      Time In: 1400 Time Out: 1510 Time Total: 70 min Greater than 50%  of this time was spent counseling and coordinating care related to the above assessment and plan.  Signed by: Dory Horn, NP   Please contact Palliative Medicine Team phone at [phone] for questions and concerns.  For individual provider: See Shea Evans

## 2018-08-19 NOTE — Progress Notes (Signed)
Pt. Refused cpap. 

## 2018-08-19 NOTE — Progress Notes (Signed)
[facility] PROGRESS NOTE  Mikia Delaluz VOH:[id number] DOB: [birth year] DOA: 08/17/2018 PCP: Lin Landsman, MD  Assessment/Plan:   Acute respiratory failure with hypoxia secondary to acute on chronic systolic and diastolic heart failure. Oxygen saturation level 98% on 2L. Chest xray with cardiac enlargement, negative for heart failure or effusion or pneumonia. BNP 2006.  -continues to improve -oxygen saturation level greater than 92% on room air -wean oxygen as able -continue IV lasix dose increased per cards -mobilize patient  Acute on chronic combined systolic and diastolic heart failure ([place]) in setting of Cardiac Amyloid TTR. RHV 11/19. Volume status +142. Weight 68.3kg from 69.8kg. echo 11/19 with moderate LVH, ef 35% and akinesis of basal midinferolateral myocardium, severe TR -appreciated assistance from cardiology -continue IV lasix at higher dose per cards -Monitor daily weights, renal function and electrolytes.  -Continue carvedilol 3.125 mg p.o. twice daily. -of note, last hospitalization she required milrinone which was discontinued before discharge secondary to worsening renal function. Also required assistance with cards and nephrology to diurese well.   Active Problems: Benign essential HTN -fair control -continue carvedilol and furosemide. Lasix dose increased per cards -Monitor BP, HR, renal function electrolytes.  OSA (obstructive sleep apnea) CPAP at bedtime. refusing CPAP  Atrial fibrillation (HCC) CHA?DS?-VASc Scoreof at least 7. Continue apixaban. Continue carvedilol 3.125 mg p.o. twice daily for rate control.  GERD (gastroesophageal reflux disease) Protonix 40 mg p.o. daily  Elevated troponin -likely related to demand in setting of recent cardiac catheterization. -elevated but flat -ekg without acute changes  Glaucoma Continue Xalatan drops at bedtime. Continue timolol drops daily.  Hyperbilirubinemia -  Fatty liver on [place] in October. No gallstones or CBD obstruction on RUQ US done in October.  Chronic kidney disease III. Baseline creatinine 1.5-1.7. currently 1.8 chart review indicates nephrology recently evaluated and opined cadiorenal/hemodynamically mediated with diuretic resistance.  -will monitor closely  Cardiac amyloidosis. Chart review indicates plan for starting Tafamidis depending on goals of care. -Palliative cared consult  Code Status: full Family Communication: none present Disposition Plan: home when read   Consultants:  [email or web address]  Procedures:    Antibiotics:    HPI/Subjective: Pt with hx/o CHF, CKD, HTN presented with  sudden onset shortness of breath.  Concern for CHF exacerbation. Respiratory distress, hypoxia, tachypnea, elevated BNP.   Continues to improve slowly. Urine output remains sluggish.   Objective: Vitals:   08/19/18 0100 08/19/18 0417  BP: 99/66 103/77  Pulse: 61 65  Resp: 18 18  Temp: 97.8 F (36.6 C) (!) 97.5 F (36.4 C)  SpO2: 98% 97%    Intake/Output Summary (Last 24 hours) at 08/19/2018 0912 Last data filed at 08/19/2018 0715 Gross per 24 hour  Intake 1142 ml  Output 1000 ml  Net 142 ml   Filed Weights   08/17/18 1910 08/18/18 0415 08/19/18 0417  Weight: 69.3 kg 69.8 kg 68.3 kg    Exam:   General:  Eyes closed arouses to verbal stimuli no acute distress  Cardiovascular: rrr HS distant no mgr no LE edema +JVD  Respiratory: no increased work of breathing. resper somewhat shallow BS distant but clear  Abdomen: non-distended non-tender  Musculoskeletal: joints without swelling/erythema   Data Reviewed: Basic Metabolic Panel: Recent Labs  Lab 08/17/18 1332 08/18/18 0701 08/19/18 0440  NA 136 139 139  K 4.6 3.8 3.4*  CL 101 103 103  CO2 21* 24 25  GLUCOSE 105* 94 94  BUN 27* 27* 25*  CREATININE 1.86* 1.79* 1.81*  CALCIUM 9.2 9.1  9.0   Liver Function Tests: Recent Labs  Lab 08/17/18 1332  08/18/18 0204 08/19/18 0440  AST 31 29 25   ALT 13 12 12   ALKPHOS 83 78 71  BILITOT 3.4* 3.1* 3.6*  PROT 7.2 6.9 6.6  ALBUMIN 3.9 3.3* 3.2*   No results for input(s): LIPASE, AMYLASE in the last 168 hours. No results for input(s): AMMONIA in the last 168 hours. CBC: Recent Labs  Lab 08/17/18 1332 08/18/18 0701  WBC 3.5* 3.2*  NEUTROABS 1.9 1.8  HGB 13.4 12.6  HCT 42.8 40.3  MCV 91.1 90.6  PLT 138* 121*   Cardiac Enzymes: Recent Labs  Lab 08/17/18 2017 08/18/18 0204 08/18/18 0701  TROPONINI 0.29* 0.28* 0.25*   BNP (last 3 results) Recent Labs    07/18/18 1721 08/17/18 1332  BNP 1,897.3* 2,006.0*    ProBNP (last 3 results) No results for input(s): PROBNP in the last 8760 hours.  CBG: Recent Labs  Lab 08/18/18 0949 08/18/18 1130 08/18/18 1742 08/18/18 2159 08/19/18 0751  GLUCAP 100* 135* 107* 148* 80    No results found for this or any previous visit (from the past 240 hour(s)).   Studies: Dg Chest 2 View  Result Date: 08/17/2018 CLINICAL DATA:  Short of breath.  History of CHF EXAM: CHEST - 2 VIEW COMPARISON:  07/18/2018 FINDINGS: Cardiac enlargement. Three lead pacemaker unchanged. Negative for heart failure. Negative for pneumonia or effusion. Right shoulder replacement. Advanced degenerative change left shoulder. IMPRESSION: No active cardiopulmonary disease. Electronically Signed   By: Franchot Gallo M.D.   On: 08/17/2018 15:11   Nm Pulmonary Vent And Perf (v/q Scan)  Result Date: 08/17/2018 CLINICAL DATA:  Shortness of breath. EXAM: NUCLEAR MEDICINE VENTILATION - PERFUSION LUNG SCAN TECHNIQUE: Ventilation images were obtained in multiple projections using inhaled aerosol Tc-60m DTPA. Perfusion images were obtained in multiple projections after intravenous injection of Tc-50m MAA. RADIOPHARMACEUTICALS:  31.2 mCi of Tc-74m DTPA aerosol inhalation and 4.19 mCi Tc70m MAA IV COMPARISON:  Chest x-ray August 17, 2018 FINDINGS: Ventilation: No focal  ventilation defect. Perfusion: No wedge shaped peripheral perfusion defects to suggest acute pulmonary embolism. IMPRESSION: No unmatched defects.  Very low probability V/Q scan. Electronically Signed   By: Dorise Bullion III M.D   On: 08/17/2018 18:44    Scheduled Meds: . apixaban  2.5 mg Oral BID  . carvedilol  3.125 mg Oral BID WC  . cyproheptadine  4 mg Oral QHS  . docusate sodium  100 mg Oral Daily  . insulin aspart  0-9 Units Subcutaneous TID WC  . pantoprazole  40 mg Oral QHS  . potassium chloride  40 mEq Oral Once   Continuous Infusions: . furosemide Stopped (08/18/18 1801)    Principal Problem:   Acute on chronic combined systolic and diastolic heart failure (HCC) Active Problems:   Benign essential HTN   Atrial fibrillation (HCC)   Elevated troponin   Acute respiratory failure with hypoxia (HCC)   Hyperbilirubinemia   OSA (obstructive sleep apnea)   GERD (gastroesophageal reflux disease)   Glaucoma   CKD (chronic kidney disease)   Amyloidosis ([place])    Time spent: 20 minutes    [place] NP [facility] If 7PM-7AM, please contact night-coverage at [email or web address], password [facility] 08/19/2018, 9:12 AM  LOS: 0 days

## 2018-08-19 NOTE — Progress Notes (Signed)
Page sent to MD request Miralax.

## 2018-08-19 NOTE — Progress Notes (Signed)
Patient sleeping during shift report.      

## 2018-08-19 NOTE — Discharge Instructions (Signed)

## 2018-08-20 DIAGNOSIS — I5023 Acute on chronic systolic (congestive) heart failure: Secondary | ICD-10-CM

## 2018-08-20 DIAGNOSIS — I5043 Acute on chronic combined systolic (congestive) and diastolic (congestive) heart failure: Secondary | ICD-10-CM

## 2018-08-20 DIAGNOSIS — J9601 Acute respiratory failure with hypoxia: Secondary | ICD-10-CM

## 2018-08-20 LAB — BASIC METABOLIC PANEL
Anion gap: 11 (ref 5–15)
BUN: 24 mg/dL — ABNORMAL HIGH (ref 8–23)
CO2: 28 mmol/L (ref 22–32)
Calcium: 9.3 mg/dL (ref 8.9–10.3)
Chloride: 103 mmol/L (ref 98–111)
Creatinine, Ser: 1.75 mg/dL — ABNORMAL HIGH (ref 0.44–1.00)
GFR calc Af Amer: 30 mL/min — ABNORMAL LOW (ref >60)
GFR calc non Af Amer: 26 mL/min — ABNORMAL LOW (ref >60)
Glucose, Bld: 99 mg/dL (ref 70–99)
Potassium: 3.9 mmol/L (ref 3.5–5.1)
Sodium: 142 mmol/L (ref 135–145)

## 2018-08-20 LAB — GLUCOSE, CAPILLARY
Glucose-Capillary: 138 mg/dL — ABNORMAL HIGH (ref 70–99)
Glucose-Capillary: 81 mg/dL (ref 70–99)

## 2018-08-20 LAB — HEPATIC FUNCTION PANEL
ALT: 13 U/L (ref 0–44)
AST: 24 U/L (ref 15–41)
Albumin: 3.4 g/dL — ABNORMAL LOW (ref 3.5–5.0)
Alkaline Phosphatase: 78 U/L (ref 38–126)
Bilirubin, Direct: 1 mg/dL — ABNORMAL HIGH (ref 0.0–0.2)
Indirect Bilirubin: 2.4 mg/dL — ABNORMAL HIGH (ref 0.3–0.9)
Total Bilirubin: 3.4 mg/dL — ABNORMAL HIGH (ref 0.3–1.2)
Total Protein: 7.3 g/dL (ref 6.5–8.1)

## 2018-08-20 MED ORDER — CYPROHEPTADINE HCL 4 MG PO TABS: 4.0000 mg | ORAL_TABLET | Freq: Every day | ORAL | 0 refills | Status: DC

## 2018-08-20 MED ORDER — BUMETANIDE 1 MG PO TABS: 3.0000 mg | ORAL_TABLET | Freq: Two times a day (BID) | ORAL | 3 refills | Status: DC

## 2018-08-20 NOTE — Progress Notes (Signed)
Patient resting comfortably during shift report. Denies complaints.  

## 2018-08-20 NOTE — Progress Notes (Signed)
Advanced Heart Failure Rounding Note   Subjective:     Feels good. Denies SOB, orthopnea or PND. Ab pain resolved.  Has been seen by Palliative Care team and remains full code.   Objective:   Weight Range:  Vital Signs:   Temp:  [97.4 F (36.3 C)-98.4 F (36.9 C)] 97.4 F (36.3 C) (12/22 0551) Pulse Rate:  [60-63] 60 (12/22 0924) Resp:  [18-20] 18 (12/22 0551) BP: (93-111)/(69-79) 93/72 (12/22 0924) SpO2:  [98 %-100 %] 100 % (12/22 0551) Weight:  [65.9 kg] 65.9 kg (12/22 0551) Last BM Date: 08/19/18  Weight change: Filed Weights   08/19/18 0417 08/20/18 0500 08/20/18 0551  Weight: 68.3 kg 65.9 kg 65.9 kg    Intake/Output:   Intake/Output Summary (Last 24 hours) at 08/20/2018 1021 Last data filed at 08/20/2018 0715 Gross per 24 hour  Intake 647.48 ml  Output 1603 ml  Net -955.52 ml     Physical Exam: General:  Elderly woman lying flat in bed  No resp difficulty HEENT: normal Neck: supple. JVP 7. Carotids 2+ bilat; no bruits. No lymphadenopathy or thryomegaly appreciated. Cor: PMI nondisplaced. Regular rate & rhythm. No rubs, gallops or murmurs. Lungs: clear Abdomen: soft, nontender, nondistended. No hepatosplenomegaly. No bruits or masses. Good bowel sounds. Extremities: no cyanosis, clubbing, rash, edema Neuro: alert & orientedx3, cranial nerves grossly intact. moves all 4 extremities w/o difficulty. Affect pleasant    Telemetry: NSR 60-70s occasional brief run NSVT Personally reviewed   Labs: Basic Metabolic Panel: Recent Labs  Lab 08/17/18 1332 08/18/18 0701 08/19/18 0440 08/20/18 0546  NA 136 139 139 142  K 4.6 3.8 3.4* 3.9  CL 101 103 103 103  CO2 21* 24 25 28   GLUCOSE 105* 94 94 99  BUN 27* 27* 25* 24*  CREATININE 1.86* 1.79* 1.81* 1.75*  CALCIUM 9.2 9.1 9.0 9.3    Liver Function Tests: Recent Labs  Lab 08/17/18 1332 08/18/18 0204 08/19/18 0440 08/20/18 0546  AST 31 29 25 24   ALT 13 12 12 13   ALKPHOS 83 78 71 78  BILITOT  3.4* 3.1* 3.6* 3.4*  PROT 7.2 6.9 6.6 7.3  ALBUMIN 3.9 3.3* 3.2* 3.4*   No results for input(s): LIPASE, AMYLASE in the last 168 hours. No results for input(s): AMMONIA in the last 168 hours.  CBC: Recent Labs  Lab 08/17/18 1332 08/18/18 0701  WBC 3.5* 3.2*  NEUTROABS 1.9 1.8  HGB 13.4 12.6  HCT 42.8 40.3  MCV 91.1 90.6  PLT 138* 121*    Cardiac Enzymes: Recent Labs  Lab 08/17/18 2017 08/18/18 0204 08/18/18 0701  TROPONINI 0.29* 0.28* 0.25*    BNP: BNP (last 3 results) Recent Labs    07/18/18 1721 08/17/18 1332  BNP 1,897.3* 2,006.0*    ProBNP (last 3 results) No results for input(s): PROBNP in the last 8760 hours.    Other results:  Imaging: No results found.   Medications:     Scheduled Medications: . apixaban  2.5 mg Oral BID  . carvedilol  3.125 mg Oral BID WC  . cyproheptadine  4 mg Oral QHS  . docusate sodium  100 mg Oral Daily  . insulin aspart  0-9 Units Subcutaneous TID WC  . pantoprazole  40 mg Oral QHS  . polyethylene glycol  17 g Oral Daily    Infusions: . sodium chloride 10 mL/hr at 08/19/18 1806  . furosemide 120 mg (08/19/18 1808)    PRN Medications: sodium chloride, acetaminophen **OR** acetaminophen, ondansetron **OR**  ondansetron (ZOFRAN) IV   Assessment:   Ms Hagmann is an 84 year old with history of chronic systolic heart failure, NICM, complete heart block with medtronic CRT-P, OSA, PAF, TIA, DMII, GERD, CKD Stage IV, and TTR amyloidosis. She has been on bumex because torsemide was to expensive.   Plan/Discussion:    1. A/C Diastolic/Systolic  Heart Failure-->Worsening RV Failure on recent RHC.  - NICM in setting of TTR cardiac amyloidosis. ECHO 07/21/2018 EF 35-40% severe TR  - Volume status much improved.Creatinine stable  - Recent RHC with adequate output so do not think there is any role for inotropes currently -Continue low dose carvedilol 3.125 mg twice a day.  -No ARB/Spiro with elevated creatinine.  - seems  to have very narrow euvolemic window and I am concerned that we may continue to struggle with her volume status.  - will add metolazone 2.5 mg once a week (Wednesday) + KCL 40. If weight goes to 149 or greater can take a second dose of metolazone on Sundays (D/w he granddaughter and Dr. Tana Coast who will be doing d/c meds)  2. 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 Process started for tafamidis.   3. PAF -Device interrogation showed intermitttent Atrial Arrhythmia . -Continue low dose carvedilol.  - Continue eliquis 2.5  mg twice a day. Lower dose with age >25  and creatinine > 1.5..   4. CKD Stage IV  -Creatinine baseline 1.8-2 -Todays creatinine is 1.75.   5. H/O CHB Has medtronic CRT-P  Biv pacing 98% of the time  6. NSVT - keep K > 4.0, Mg > 2.0 - continue b-blocker  7. Hypokalemia - supp  Ok for d/c today. Her granddaughter will take her back to [place] for a bit but will then arrange f/u in HF Clinic.   Length of Stay: 1   Glori Bickers MD 08/20/2018, 10:21 AM  Advanced Heart Failure Team Pager [phone] (M-F; [place])  Please contact [facility] for night-coverage after hours (4p -7a ) and weekends on [email or web address]

## 2018-08-20 NOTE — Plan of Care (Signed)
  Problem: Pain Managment: Goal: General experience of comfort will improve Outcome: Completed/Met   Problem: Skin Integrity: Goal: Risk for impaired skin integrity will decrease Outcome: Completed/Met

## 2018-08-20 NOTE — Progress Notes (Signed)
Call placed to [facility] to notify of telemetry monitoring d/c.   

## 2018-08-20 NOTE — Discharge Summary (Signed)
Physician Discharge Summary   Patient ID: Kim Mullins MRN: [id number] DOB/AGE: May 07, 1930 84 y.o.  Admit date: 08/17/2018 Discharge date: 08/20/2018  Primary Care Physician:  Lin Landsman, MD   Recommendations for Outpatient Follow-up:  1. Follow up with PCP in 1-2 weeks 2. Please obtain be met in 1 week  Home Health: None  Equipment/Devices:   Discharge Condition: stable  CODE STATUS: FULL   Diet recommendation: Heart healthy diet   Discharge Diagnoses:    . Acute on chronic combined systolic and diastolic heart failure ([place]) . Paroxysmal atrial fibrillation (HCC) . Benign essential HTN . OSA (obstructive sleep apnea) . GERD (gastroesophageal reflux disease) . Elevated troponin likely due to demand ischemia . Glaucoma . Hyperbilirubinemia . Acute respiratory failure with hypoxia (HCC) secondary to acute on chronic CHF exacerbation Cardiac amyloid CKD stage IV baseline creatinine 1.8-2 History of complete heart block with by V pacing   Consults: Cardiology    Allergies:   Allergies  Allergen Reactions  . Hydrocodone Other (See Comments)    Took away all energy and made her stop eating food for short time  . Percocet [Oxycodone-Acetaminophen] Other (See Comments)    Too away all energy and made her stop eating food for short time  . Eggs Or Egg-Derived Products Hives and Rash  . Mercurial Derivatives Itching and Rash  . Penicillins Hives and Rash    Has patient had a PCN reaction causing immediate rash, facial/tongue/throat swelling, SOB or lightheadedness with hypotension: Yes Has patient had a PCN reaction causing severe rash involving mucus membranes or skin necrosis: No Has patient had a PCN reaction that required hospitalization: No Has patient had a PCN reaction occurring within the last 10 years: Yes If all of the above answers are "NO", then may proceed with Cephalosporin use.  . Quinine Derivatives Hives and Rash     DISCHARGE  MEDICATIONS: Allergies as of 08/20/2018      Reactions   Hydrocodone Other (See Comments)   Took away all energy and made her stop eating food for short time   Percocet [oxycodone-acetaminophen] Other (See Comments)   Too away all energy and made her stop eating food for short time   Eggs Or Egg-derived Products Hives, Rash   Mercurial Derivatives Itching, Rash   Penicillins Hives, Rash   Has patient had a PCN reaction causing immediate rash, facial/tongue/throat swelling, SOB or lightheadedness with hypotension: Yes Has patient had a PCN reaction causing severe rash involving mucus membranes or skin necrosis: No Has patient had a PCN reaction that required hospitalization: No Has patient had a PCN reaction occurring within the last 10 years: Yes If all of the above answers are "NO", then may proceed with Cephalosporin use.   Quinine Derivatives Hives, Rash      Medication List    TAKE these medications   apixaban 2.5 MG Tabs tablet Commonly known as:  ELIQUIS Take 1 tablet (2.5 mg total) by mouth 2 (two) times daily.   bumetanide 1 MG tablet Commonly known as:  BUMEX Take 3 tablets (3 mg total) by mouth 2 (two) times daily. Take additional dose on Sunday if weight 149lbs or greater. What changed:  additional instructions   carvedilol 3.125 MG tablet Commonly known as:  COREG Take 1 tablet (3.125 mg total) by mouth 2 (two) times daily with a meal.   cyproheptadine 4 MG tablet Commonly known as:  PERIACTIN Take 1 tablet (4 mg total) by mouth at bedtime.   diclofenac sodium  1 % Gel Commonly known as:  VOLTAREN Apply 2 g topically 3 (three) times daily as needed (pain, inflammation).   glucose blood test strip Use to test blood sugar twice daily ([employer]uc[employer] Smartview)   latanoprost 0.005 % ophthalmic solution Commonly known as:  XALATAN Place 1 drop into both eyes at bedtime.   potassium chloride SA 20 MEQ tablet Commonly known as:  K-DUR,KLOR-CON Take 1 tablet (20 mEq  total) by mouth 2 (two) times daily. What changed:  when to take this   timolol 0.5 % ophthalmic solution Commonly known as:  TIMOPTIC Place 1 drop into both eyes daily.   TYLENOL 325 MG Caps Generic drug:  Acetaminophen Take 650 mg by mouth daily as needed (for shoulder pain).        Brief H and P: For complete details please refer to admission H and P, but in briefPatient is a 84 year old female with anemia, osteoarthritis, asthma, chronic A. fib, history of bowel obstruction, chronic combined systolic and diastolic CHF, history of dilated cardiomyopathy, type 2 diabetes, hypertension, OSA on CPAP presented to ED with progressively worsening dyspnea for the past few days.  Patient reported orthopnea, PND otherwise no chest pain, palpitations or diaphoresis.  Patient was admitted for further work-up.   Hospital Course:  Acute on chronic combined systolic and diastolic heart failure (HCC) -Recent right heart cath showed worsening RV failure -Cardiology following, patient was placed on IV Lasix , low-dose Coreg -No ACE/ARB/spironolactone secondary to elevated creatinine -Palliative care consult was obtained, full scope of treatment -Patient requested to be discharged to home today, clarified discharge medications with Dr. Haroldine Laws on phone.  Recommended metolazone twice daily with potassium replacement, extra dose on Sunday if weight greater than 149 or above.   Paroxysmal atrial fibrillation -Device interrogated showed intermittent atrial arrhythmia -Heart rate controlled, continue low-dose Coreg -[place]ali VASC2 atleast7, On anticoagulation Eliquis 2.5 mg twice daily  Cardiac amyloidosis -Cardiology following, process started for tafamidis    Benign essential HTN -Fair control, continue Coreg, Bumex    OSA (obstructive sleep apnea) -Continue CPAP, refusing  CKD stage IV: Baseline creatinine 1.8-2 -Monitor creatinine function closely with diuresis, currently at  baseline     GERD (gastroesophageal reflux disease) -Continue PPI    Elevated troponin likely due to demand ischemia in the setting of acute on chronic CHF -Continue current management, seen by cardiology  Hyperbilirubinemia -Ultrasound in 05/2018 showed fatty liver, no gallstones or CBD obstruction    Day of Discharge S: Feels better, wants to go home  BP 93/72   Pulse 60   Temp (!) 97.4 F (36.3 C) (Oral)   Resp 18   Ht 5' 3"  (1.6 m)   Wt 65.9 kg   SpO2 100%   BMI 25.72 kg/m   Physical Exam: General: Alert and awake oriented x3 not in any acute distress. HEENT: anicteric sclera, pupils reactive to light and accommodation CVS: S1-S2 clear no murmur rubs or gallops Chest: clear to auscultation bilaterally, no wheezing rales or rhonchi Abdomen: soft nontender, nondistended, normal bowel sounds Extremities: no cyanosis, clubbing or edema noted bilaterally Neuro: Cranial nerves II-XII intact, no focal neurological deficits   The results of significant diagnostics from this hospitalization (including imaging, microbiology, ancillary and laboratory) are listed below for reference.      Procedures/Studies:  Dg Chest 2 View  Result Date: 08/17/2018 CLINICAL DATA:  Short of breath.  History of CHF EXAM: CHEST - 2 VIEW COMPARISON:  07/18/2018 FINDINGS: Cardiac enlargement. Three lead pacemaker  unchanged. Negative for heart failure. Negative for pneumonia or effusion. Right shoulder replacement. Advanced degenerative change left shoulder. IMPRESSION: No active cardiopulmonary disease. Electronically Signed   By: Franchot Gallo M.D.   On: 08/17/2018 15:11   Nm Pulmonary Vent And Perf (v/q Scan)  Result Date: 08/17/2018 CLINICAL DATA:  Shortness of breath. EXAM: NUCLEAR MEDICINE VENTILATION - PERFUSION LUNG SCAN TECHNIQUE: Ventilation images were obtained in multiple projections using inhaled aerosol Tc-52mDTPA. Perfusion images were obtained in multiple projections  after intravenous injection of Tc-960mAA. RADIOPHARMACEUTICALS:  31.2 mCi of Tc-9926mPA aerosol inhalation and 4.19 mCi Tc99m44m IV COMPARISON:  Chest x-ray August 17, 2018 FINDINGS: Ventilation: No focal ventilation defect. Perfusion: No wedge shaped peripheral perfusion defects to suggest acute pulmonary embolism. IMPRESSION: No unmatched defects.  Very low probability V/Q scan. Electronically Signed   By: DaviDorise Bullion M.D   On: 08/17/2018 18:44       LAB RESULTS: Basic Metabolic Panel: Recent Labs  Lab 08/19/18 0440 08/20/18 0546  NA 139 142  K 3.4* 3.9  CL 103 103  CO2 25 28  GLUCOSE 94 99  BUN 25* 24*  CREATININE 1.81* 1.75*  CALCIUM 9.0 9.3   Liver Function Tests: Recent Labs  Lab 08/19/18 0440 08/20/18 0546  AST 25 24  ALT 12 13  ALKPHOS 71 78  BILITOT 3.6* 3.4*  PROT 6.6 7.3  ALBUMIN 3.2* 3.4*   No results for input(s): LIPASE, AMYLASE in the last 168 hours. No results for input(s): AMMONIA in the last 168 hours. CBC: Recent Labs  Lab 08/17/18 1332 08/18/18 0701  WBC 3.5* 3.2*  NEUTROABS 1.9 1.8  HGB 13.4 12.6  HCT 42.8 40.3  MCV 91.1 90.6  PLT 138* 121*   Cardiac Enzymes: Recent Labs  Lab 08/18/18 0204 08/18/18 0701  TROPONINI 0.28* 0.25*   BNP: Invalid input(s): POCBNP CBG: Recent Labs  Lab 08/19/18 2106 08/20/18 0734  GLUCAP 87 81      Disposition and Follow-up: Discharge Instructions    (HEART FAILURE PATIENTS) Call MD:  Anytime you have any of the following symptoms: 1) 3 pound weight gain in 24 hours or 5 pounds in 1 week 2) shortness of breath, with or without a dry hacking cough 3) swelling in the hands, feet or stomach 4) if you have to sleep on extra pillows at night in order to breathe.   Complete by:  As directed    Diet - low sodium heart healthy   Complete by:  As directed    Increase activity slowly   Complete by:  As directed        DISPOSITION: [facility]   Why:  They will do your home health care at your home Contact information: [phone]        ReesLin Landsman. Schedule an appointment as soon as possible for a visit in 2 week(s).   Specialty:  Family Medicine Contact information: [phone]        Bensimhon, DaniShaune Pascal. Schedule an appointment as soon as possible for a visit in 2 week(s).   Specialty:  Cardiology Contact information: [phone]        Time coordinating discharge:  35 minutes  Signed:   Alline Pio  M.D. [facility] 08/20/2018, 10:46 AM Pager: [phone]

## 2018-08-22 ENCOUNTER — Other Ambulatory Visit (HOSPITAL_COMMUNITY): Payer: Medicare Other

## 2018-09-04 ENCOUNTER — Other Ambulatory Visit: Payer: Self-pay | Admitting: Internal Medicine

## 2018-09-21 ENCOUNTER — Encounter (HOSPITAL_COMMUNITY): Payer: Medicare Other

## 2018-09-28 ENCOUNTER — Ambulatory Visit (INDEPENDENT_AMBULATORY_CARE_PROVIDER_SITE_OTHER): Payer: Medicare HMO | Admitting: Internal Medicine

## 2018-09-28 ENCOUNTER — Encounter: Payer: Self-pay | Admitting: Internal Medicine

## 2018-09-28 VITALS — BP 102/60 | HR 76 | Ht 63.0 in | Wt 145.0 lb

## 2018-09-28 DIAGNOSIS — I10 Essential (primary) hypertension: Secondary | ICD-10-CM | POA: Diagnosis not present

## 2018-09-28 DIAGNOSIS — I5022 Chronic systolic (congestive) heart failure: Secondary | ICD-10-CM

## 2018-09-28 DIAGNOSIS — I480 Paroxysmal atrial fibrillation: Secondary | ICD-10-CM

## 2018-09-28 DIAGNOSIS — Z950 Presence of cardiac pacemaker: Secondary | ICD-10-CM

## 2018-09-28 NOTE — Patient Instructions (Addendum)
Medication Instructions:  Your physician has recommended you make the following change in your medication:   1.  Increase your Eliquis 2.5 mg--Take one tablet by mouth TWICE a day.  Labwork: None ordered.  Testing/Procedures: None ordered.  Follow-Up: Your physician wants you to follow-up in: one year with Dr. Lovena Le.   You will receive a reminder letter in the mail two months in advance. If you don't receive a letter, please call our office to schedule the follow-up appointment.  Remote monitoring is used to monitor your Pacemaker from home. This monitoring reduces the number of office visits required to check your device to one time per year. It allows [place] to keep an eye on the functioning of your device to ensure it is working properly. You are scheduled for a device check from home on 12/28/2018. You may send your transmission at any time that day. If you have a wireless device, the transmission will be sent automatically. After your physician reviews your transmission, you will receive a postcard with your next transmission date.  Any Other Special Instructions Will Be Listed Below (If Applicable).  If you need a refill on your cardiac medications before your next appointment, please call your pharmacy.

## 2018-09-28 NOTE — Progress Notes (Signed)
HPI [name] returns today for followup. She is a pleasant 85 yo woman with a h/o HTN, chronic systolic heart failure, s/p BiV PPM, and PAF. She denies chest pain but has had some sob. She. She has tolerated her anti-coagulation though she is taking only once daily. She has a reduced level of energy.  Allergies  Allergen Reactions  . Hydrocodone Other (See Comments)    Took away all energy and made her stop eating food for short time  . Percocet [Oxycodone-Acetaminophen] Other (See Comments)    Too away all energy and made her stop eating food for short time  . Eggs Or Egg-Derived Products Hives and Rash  . Mercurial Derivatives Itching and Rash  . Penicillins Hives and Rash    Has patient had a PCN reaction causing immediate rash, facial/tongue/throat swelling, SOB or lightheadedness with hypotension: Yes Has patient had a PCN reaction causing severe rash involving mucus membranes or skin necrosis: No Has patient had a PCN reaction that required hospitalization: No Has patient had a PCN reaction occurring within the last 10 years: Yes If all of the above answers are "NO", then may proceed with Cephalosporin use.  . Quinine Derivatives Hives and Rash     Current Outpatient Medications  Medication Sig Dispense Refill  . Acetaminophen (TYLENOL) 325 MG CAPS Take 650 mg by mouth daily as needed (for shoulder pain).     Marland Kitchen apixaban (ELIQUIS) 2.5 MG TABS tablet Take 1 tablet (2.5 mg total) by mouth 2 (two) times daily. 60 tablet 0  . bumetanide (BUMEX) 1 MG tablet Take 3 tablets (3 mg total) by mouth 2 (two) times daily. Take additional dose on Sunday if weight 149lbs or greater. 180 tablet 3  . carvedilol (COREG) 3.125 MG tablet Take 1 tablet (3.125 mg total) by mouth 2 (two) times daily with a meal. 60 tablet 0  . cyproheptadine (PERIACTIN) 4 MG tablet Take 1 tablet (4 mg total) by mouth at bedtime. 30 tablet 0  . diclofenac sodium (VOLTAREN) 1 % GEL Apply 2 g topically 3 (three)  times daily as needed (pain, inflammation). 100 g 0  . glucose blood test strip Use to test blood sugar twice daily (Accuchek Smartview) 100 each 0  . latanoprost (XALATAN) 0.005 % ophthalmic solution Place 1 drop into both eyes at bedtime.   11  . potassium chloride SA (K-DUR,KLOR-CON) 20 MEQ tablet Take 1 tablet (20 mEq total) by mouth 2 (two) times daily. (Patient taking differently: Take 20 mEq by mouth 3 (three) times daily. ) 60 tablet 3  . timolol (TIMOPTIC) 0.5 % ophthalmic solution Place 1 drop into both eyes daily.   11   No current facility-administered medications for this visit.      Past Medical History:  Diagnosis Date  . Anemia    occassionally  . Arthritis    all over.  . Asthma    Allergixc reaction to cats.  . Atrial fibrillation ([place])   . Bowel obstruction ([place])   . Chronic systolic CHF (congestive heart failure) (HCC)    EF 30-35% by cath, echo 2016 EF 50%  . Complete heart block (HCC)    a. s/p MDT CRTP pacemaker  . DCM (dilated cardiomyopathy) ([place]) 08/16/2014   normal coronary arteries on cath with EF 30-45%.  EF now 50% by echo 11/2014  . Diabetes mellitus 2006   Diet and exercise controlled.  Marland Kitchen Dyspnea   . GERD (gastroesophageal reflux disease)  occ  . Glaucoma 08/17/2018  . Hypertension   . Left tibial fracture 2007  . OSA (obstructive sleep apnea) 10/23/2014   Moderate with AHI 21/hr  . Osteoarthritis of right shoulder region 06/26/2013  . Stroke ([place])     ROS:   All systems reviewed and negative except as noted in the HPI.   Past Surgical History:  Procedure Laterality Date  . ABDOMINAL HYSTERECTOMY  1969  . BI-VENTRICULAR PACEMAKER INSERTION N/A 10/07/2014   MDT CRTP implanted by Dr Lovena Le  . BRAVO [place] STUDY N/A 06/11/2014   Procedure: BRAVO [place] STUDY;  Surgeon: Inda Castle, MD;  Location: [facility] ENDOSCOPY;  Service: Endoscopy;  Laterality: N/A;  . BUNIONECTOMY  1984   Bilateral  . CARDIAC CATHETERIZATION     normal coronary arteries    . Carpal [place]  2004   Bilateral  . CATARACT EXTRACTION Right    early 2015  . corn removal  1999   Bilateral feet  . DILATION AND CURETTAGE OF UTERUS  1968  . ESOPHAGOGASTRODUODENOSCOPY (EGD) WITH PROPOFOL N/A 06/11/2014   Procedure: ESOPHAGOGASTRODUODENOSCOPY (EGD) WITH PROPOFOL;  Surgeon: Inda Castle, MD;  Location: [facility] ENDOSCOPY;  Service: Endoscopy;  Laterality: N/A;  . [place]   Surgery to fix Retainal detachment, bilateral  . JOINT REPLACEMENT    . LEFT AND RIGHT HEART CATHETERIZATION WITH CORONARY ANGIOGRAM N/A 08/29/2014   Procedure: LEFT AND RIGHT HEART CATHETERIZATION WITH CORONARY ANGIOGRAM;  Surgeon: Peter M Martinique, MD;  Location: [facility] CATH LAB;  Service: Cardiovascular;  Laterality: N/A;  . MALONEY DILATION  06/11/2014   Procedure: Venia Minks DILATION;  Surgeon: Inda Castle, MD;  Location: [facility] ENDOSCOPY;  Service: Endoscopy;;  . PILONIDAL CYST EXCISION  1959  . RIGHT HEART CATH N/A 07/24/2018   Procedure: RIGHT HEART CATH;  Surgeon: Larey Dresser, MD;  Location: [facility] CV LAB;  Service: Cardiovascular;  Laterality: N/A;  . TONSILLECTOMY  1942  . TOTAL HIP ARTHROPLASTY  11/16/2011   Procedure: TOTAL HIP ARTHROPLASTY ANTERIOR APPROACH;  Surgeon: Hessie Dibble, MD;  Location: [facility];  Service: Orthopedics;  Laterality: Right;  DEPUY  . TOTAL SHOULDER ARTHROPLASTY Right 06/26/2013   Procedure: TOTAL SHOULDER ARTHROPLASTY;  Surgeon: Johnny Bridge, MD;  Location: [facility];  Service: Orthopedics;  Laterality: Right;     Family History  Problem Relation Age of Onset  . Dementia Mother   . Coronary artery disease Mother   . Cancer Father        blood ? type  . Diabetes Maternal Grandfather   . Anuerysm Daughter        brain  . Colon cancer Neg Hx      Social History   Socioeconomic History  . Marital status: Widowed    Spouse name: Not on file  . Number of children: 0  . Years of education: Not on file  . Highest education level: Doctorate   Occupational History  . Occupation: retired  Scientific laboratory technician  . Financial resource strain: Not hard at all  . Food insecurity:    Worry: Never true    Inability: Never true  . Transportation needs:    Medical: No    Non-medical: No  Tobacco Use  . Smoking status: Former Smoker    Packs/day: 1.00    Years: 45.00    Pack years: 45.00    Types: Cigarettes    Last attempt to quit: 07/24/1978    Years since quitting: 40.2  . Smokeless  tobacco: Never Used  Substance and Sexual Activity  . Alcohol use: Yes    Alcohol/week: 7.0 standard drinks    Types: 7 Glasses of wine per week    Comment: every day  . Drug use: No  . Sexual activity: Not Currently  Lifestyle  . Physical activity:    Days per week: Not on file    Minutes per session: Not on file  . Stress: Not on file  Relationships  . Social connections:    Talks on phone: Not on file    Gets together: Not on file    Attends religious service: Not on file    Active member of club or organization: Not on file    Attends meetings of clubs or organizations: Not on file    Relationship status: Not on file  . Intimate partner violence:    Fear of current or ex partner: Not on file    Emotionally abused: Not on file    Physically abused: Not on file    Forced sexual activity: Not on file  Other Topics Concern  . Not on file  Social History Narrative   Lives alone     BP 102/60   Pulse 76   Ht 5\' 3"  (1.6 m)   Wt 145 lb (65.8 kg)   SpO2 99%   BMI 25.69 kg/m   Physical Exam:  Well appearing NAD HEENT: Unremarkable Neck:  No JVD, no thyromegally Lymphatics:  No adenopathy Back:  No CVA tenderness Lungs:  Clear with no wheezes. HEART:  Regular rate rhythm, no murmurs, no rubs, no clicks Abd:  soft, positive bowel sounds, no organomegally, no rebound, no guarding Ext:  2 plus pulses, no edema, no cyanosis, no clubbing Skin:  No rashes no nodules Neuro:  CN II through XII intact, motor grossly intact  EKG - nsr  with biv pacing  DEVICE  Normal device function.  See PaceArt for details.   Assess/Plan: 1. Chronic systolic heart failure - she has class 2 symptoms.  She will continue her current meds. 2. Atrial tachy - she is asymptomatic. No indication to add an AA drug at this time.  3. Chronic systolic heart failure - she will continue her current medical therapy. Her fluid index is up and she is encouraged to reduce her sodium intake. 4. PPM- her medtronic Biv PPM is working normally. We will recheck in several months.  Mikle Bosworth.D.

## 2018-09-29 NOTE — Addendum Note (Signed)
Addended by: Rose Phi on: 09/29/2018 05:48 PM   Modules accepted: Orders

## 2018-10-02 ENCOUNTER — Other Ambulatory Visit (HOSPITAL_COMMUNITY): Payer: Self-pay

## 2018-10-02 DIAGNOSIS — I10 Essential (primary) hypertension: Secondary | ICD-10-CM | POA: Diagnosis not present

## 2018-10-02 DIAGNOSIS — E119 Type 2 diabetes mellitus without complications: Secondary | ICD-10-CM | POA: Diagnosis not present

## 2018-10-02 DIAGNOSIS — Z8673 Personal history of transient ischemic attack (TIA), and cerebral infarction without residual deficits: Secondary | ICD-10-CM | POA: Diagnosis not present

## 2018-10-02 DIAGNOSIS — R2681 Unsteadiness on feet: Secondary | ICD-10-CM | POA: Diagnosis not present

## 2018-10-02 DIAGNOSIS — R634 Abnormal weight loss: Secondary | ICD-10-CM | POA: Diagnosis not present

## 2018-10-02 DIAGNOSIS — I509 Heart failure, unspecified: Secondary | ICD-10-CM | POA: Diagnosis not present

## 2018-10-02 DIAGNOSIS — R42 Dizziness and giddiness: Secondary | ICD-10-CM | POA: Diagnosis not present

## 2018-10-02 LAB — CUP PACEART INCLINIC DEVICE CHECK
Battery Remaining Longevity: 28 mo
Battery Voltage: 2.96 V
Brady Statistic AP VP Percent: 84.17 %
Brady Statistic AP VS Percent: 0.03 %
Brady Statistic AS VP Percent: 15.74 %
Brady Statistic AS VS Percent: 0.06 %
Brady Statistic RA Percent Paced: 83.05 %
Brady Statistic RV Percent Paced: 99.28 %
Date Time Interrogation Session: 20200130202317
Implantable Lead Implant Date: 20160208
Implantable Lead Implant Date: 20160208
Implantable Lead Implant Date: 20160208
Implantable Lead Location: 753858
Implantable Lead Location: 753859
Implantable Lead Model: 4396
Implantable Lead Model: 5076
Implantable Lead Model: 5076
Implantable Pulse Generator Implant Date: 20160208
Lead Channel Impedance Value: 323 Ohm
Lead Channel Impedance Value: 323 Ohm
Lead Channel Impedance Value: 323 Ohm
Lead Channel Impedance Value: 399 Ohm
Lead Channel Impedance Value: 418 Ohm
Lead Channel Impedance Value: 494 Ohm
Lead Channel Impedance Value: 494 Ohm
Lead Channel Pacing Threshold Amplitude: 0.75 V
Lead Channel Pacing Threshold Amplitude: 0.75 V
Lead Channel Pacing Threshold Amplitude: 1.625 V
Lead Channel Pacing Threshold Pulse Width: 0.4 ms
Lead Channel Pacing Threshold Pulse Width: 0.4 ms
Lead Channel Pacing Threshold Pulse Width: 0.8 ms
Lead Channel Sensing Intrinsic Amplitude: 2.125 mV
Lead Channel Sensing Intrinsic Amplitude: 2.5 mV
Lead Channel Sensing Intrinsic Amplitude: 4.625 mV
Lead Channel Setting Pacing Amplitude: 2 V
Lead Channel Setting Pacing Amplitude: 2.5 V
Lead Channel Setting Pacing Amplitude: 2.5 V
Lead Channel Setting Pacing Pulse Width: 0.4 ms
Lead Channel Setting Pacing Pulse Width: 0.8 ms
Lead Channel Setting Sensing Sensitivity: 2.8 mV
MDC IDC LEAD LOCATION: 753860
MDC IDC MSMT LEADCHNL LV IMPEDANCE VALUE: 399 Ohm
MDC IDC MSMT LEADCHNL LV IMPEDANCE VALUE: 608 Ohm
MDC IDC MSMT LEADCHNL RV SENSING INTR AMPL: 4.625 mV

## 2018-10-02 MED ORDER — APIXABAN 2.5 MG PO TABS: 2.5000 mg | ORAL_TABLET | Freq: Two times a day (BID) | ORAL | 3 refills | Status: DC

## 2018-10-04 ENCOUNTER — Other Ambulatory Visit (HOSPITAL_COMMUNITY): Payer: Self-pay

## 2018-10-04 MED ORDER — CARVEDILOL 3.125 MG PO TABS: 3.1250 mg | ORAL_TABLET | Freq: Two times a day (BID) | ORAL | 6 refills | Status: DC

## 2018-10-09 ENCOUNTER — Encounter (HOSPITAL_COMMUNITY): Payer: Self-pay

## 2018-10-09 ENCOUNTER — Ambulatory Visit (HOSPITAL_COMMUNITY)
Admission: RE | Admit: 2018-10-09 | Discharge: 2018-10-09 | Disposition: A | Discharge status: Home / Self Care | Payer: Medicare HMO | Source: Ambulatory Visit | Attending: Cardiology | Admitting: Cardiology

## 2018-10-09 ENCOUNTER — Telehealth (HOSPITAL_COMMUNITY): Payer: Self-pay | Admitting: Surgery

## 2018-10-09 VITALS — BP 86/64 | HR 72 | Wt 130.6 lb

## 2018-10-09 DIAGNOSIS — Z7901 Long term (current) use of anticoagulants: Secondary | ICD-10-CM | POA: Insufficient documentation

## 2018-10-09 DIAGNOSIS — Z79899 Other long term (current) drug therapy: Secondary | ICD-10-CM | POA: Diagnosis not present

## 2018-10-09 DIAGNOSIS — I420 Dilated cardiomyopathy: Secondary | ICD-10-CM

## 2018-10-09 DIAGNOSIS — Z950 Presence of cardiac pacemaker: Secondary | ICD-10-CM

## 2018-10-09 DIAGNOSIS — Z8673 Personal history of transient ischemic attack (TIA), and cerebral infarction without residual deficits: Secondary | ICD-10-CM | POA: Insufficient documentation

## 2018-10-09 DIAGNOSIS — G4733 Obstructive sleep apnea (adult) (pediatric): Secondary | ICD-10-CM | POA: Diagnosis not present

## 2018-10-09 DIAGNOSIS — Z87891 Personal history of nicotine dependence: Secondary | ICD-10-CM | POA: Diagnosis not present

## 2018-10-09 DIAGNOSIS — J45909 Unspecified asthma, uncomplicated: Secondary | ICD-10-CM | POA: Insufficient documentation

## 2018-10-09 DIAGNOSIS — N184 Chronic kidney disease, stage 4 (severe): Secondary | ICD-10-CM | POA: Diagnosis not present

## 2018-10-09 DIAGNOSIS — I959 Hypotension, unspecified: Secondary | ICD-10-CM | POA: Diagnosis not present

## 2018-10-09 DIAGNOSIS — K219 Gastro-esophageal reflux disease without esophagitis: Secondary | ICD-10-CM | POA: Insufficient documentation

## 2018-10-09 DIAGNOSIS — I5042 Chronic combined systolic (congestive) and diastolic (congestive) heart failure: Secondary | ICD-10-CM | POA: Insufficient documentation

## 2018-10-09 DIAGNOSIS — I10 Essential (primary) hypertension: Secondary | ICD-10-CM

## 2018-10-09 DIAGNOSIS — I5022 Chronic systolic (congestive) heart failure: Secondary | ICD-10-CM

## 2018-10-09 DIAGNOSIS — K5900 Constipation, unspecified: Secondary | ICD-10-CM | POA: Diagnosis not present

## 2018-10-09 DIAGNOSIS — I480 Paroxysmal atrial fibrillation: Secondary | ICD-10-CM | POA: Diagnosis not present

## 2018-10-09 DIAGNOSIS — I442 Atrioventricular block, complete: Secondary | ICD-10-CM | POA: Diagnosis not present

## 2018-10-09 DIAGNOSIS — I428 Other cardiomyopathies: Secondary | ICD-10-CM | POA: Diagnosis not present

## 2018-10-09 DIAGNOSIS — E1122 Type 2 diabetes mellitus with diabetic chronic kidney disease: Secondary | ICD-10-CM | POA: Diagnosis not present

## 2018-10-09 DIAGNOSIS — R630 Anorexia: Secondary | ICD-10-CM | POA: Diagnosis not present

## 2018-10-09 LAB — BASIC METABOLIC PANEL
Anion gap: 9 (ref 5–15)
BUN: 17 mg/dL (ref 8–23)
CO2: 25 mmol/L (ref 22–32)
Calcium: 8.9 mg/dL (ref 8.9–10.3)
Chloride: 103 mmol/L (ref 98–111)
Creatinine, Ser: 1.72 mg/dL — ABNORMAL HIGH (ref 0.44–1.00)
GFR calc Af Amer: 31 mL/min — ABNORMAL LOW (ref >60)
GFR calc non Af Amer: 26 mL/min — ABNORMAL LOW (ref >60)
Glucose, Bld: 170 mg/dL — ABNORMAL HIGH (ref 70–99)
Potassium: 3.7 mmol/L (ref 3.5–5.1)
Sodium: 137 mmol/L (ref 135–145)

## 2018-10-09 LAB — CBC
HCT: 43.9 % (ref 36.0–46.0)
Hemoglobin: 14 g/dL (ref 12.0–15.0)
MCH: 28.7 pg (ref 26.0–34.0)
MCHC: 31.9 g/dL (ref 30.0–36.0)
MCV: 90.1 fL (ref 80.0–100.0)
Platelets: 174 10*3/uL (ref 150–400)
RBC: 4.87 MIL/uL (ref 3.87–5.11)
RDW: 18.5 % — ABNORMAL HIGH (ref 11.5–15.5)
WBC: 3 10*3/uL — ABNORMAL LOW (ref 4.0–10.5)
nRBC: 0 % (ref 0.0–0.2)

## 2018-10-09 LAB — BRAIN NATRIURETIC PEPTIDE: B Natriuretic Peptide: 1424.7 pg/mL — ABNORMAL HIGH (ref 0.0–100.0)

## 2018-10-09 MED ORDER — APIXABAN 2.5 MG PO TABS: 2.5000 mg | ORAL_TABLET | Freq: Two times a day (BID) | ORAL | 6 refills | Status: DC

## 2018-10-09 NOTE — Progress Notes (Signed)
Samples for Eliquis 2.5mg  given to patient.Pt states understanding of change of dose and when to take.    4 boxes (14 tabs each box) Lot [id number] Exp 7/20

## 2018-10-09 NOTE — Telephone Encounter (Signed)
Patient has been referred to the HF [employer].  I sent all appropriate paperwork via secure email to the Paramedic team.

## 2018-10-09 NOTE — Patient Instructions (Addendum)
STOP Eliquis 5mg  daily  START Eliquis 2.5mg  (1 tab) twice a day  Labs today We will only contact you if something comes back abnormal or we need to make some changes. Otherwise no news is good news!  You have been referred to Palliative Care and Paramedicine. They will be in contact with you  Your physician recommends that you schedule a follow-up appointment in: 2-3 weeks with NP clinic

## 2018-10-09 NOTE — Progress Notes (Signed)
Advanced Heart Failure Clinic Note   Patient ID: Kim Mullins, female   DOB: 24-Apr-1930, 85 y.o.   MRN: [id number] PCP: Dr. Ayesha Rumpf Cardiology: Dr. Radford Pax HF Cardiology: Dr. Nickolas Madrid Kim Mullins is a 85 y.o. female with history of chronic systolic CHF/nonischemic cardiomyopathy, prior complete heart block, OSA, and paroxysmal atrial fibrillation presents for CHF clinic evaluation.  She was diagnosed with a cardiomyopathy back in 2015.  She had right and left heart cath in 12/15, showing no significant coronary disease.  EF was in the 30-35% range.  She was admitted in 2/16 with symptomatic bradycardia/complete heart block and had Medtronic CRT-P placed.  In 4/16, EF was back up to 50-55%.  However, repeat echo in 6/17 showed EF down to 15-20% with RV dysfunction.    She was admitted in 8/17 with suspected TIA => dysarthria and blurred vision.  She had been on Eliquis 2.5 mg bid, this was increased to 5 mg bid.  Lisinopril was stopped and Coreg was decreased due to soft BP.  Echo was done that admission, showing improvement in EF to 40-45%.    Echo in 5/19 showed EF 40%, moderate LVH, moderate diastolic dysfunction, mild to moderately decreased RV systolic function, dilated IVC.   Admitted 11/19 - 07/26/18 with worsening renal failure with Cr up to 3.7 from baseline 1.5 - 1.7. RHC demonstrated R>L failure as below. Transiently on milrinone, but weaned off prior to discharge. Cr remained elevated but stable at 2.7 on day of discharge.  Admitted to [facility] with increased shortness of breath. No reported chest pain or diaphoresis. She had been taking all medications but had felt worse. O2 sats on arrival were 89% on room air. In the ED she was given 80 mg IV lasix. CXR was negative for edema. CTA was negative for PE. Pertinent admission labs included: BNP 2006, creatinine 1.8, troponin 0.29, hgb 13.4, and WBC 3.5. Cardiology followed,patient was placed on IV Lasix, low-dose Coreg. No  ACE/ARB/spironolactone 2/2 elevated creatinine. Determined to have narrow euvolemic window. Dr. Haroldine Laws recommended added metolazone 2.5mg  once weekly (on Wednesdays) with an extra dose of KCL 40, and an additional dose of metolazone on Sundays if weight was >149lbs.   Patient returns to clinic today for hospital follow-up, arrives in wheelchair. Complains she has not had a BM in >1wk despite taking a laxative and stool softener at home. She also has poor appetite stating she "doesn't eat because she isn't hungry." Otherwise no complaints. She denies chest pain, lightheadedness, dizziness, edema, SOB or orthopnea. She has no understanding of current medication regimen. Judging pills based on color and pill shape, and not reading bottles. She could potentially be taking coreg three times daily. No bumex in bottles, but she states it is in pill boxes at home. She has been taking Eliquis 5 mg DAILY. Her cousin was helping her with pill boxes. She is overall a poor historian. Primary complaint is fatigue.   PMH: 1. Type II diabetes: Diet-controlled.  2. GERD: s/p esophageal dilatation.  3. Chronic systolic CHF: 1st noted in 2015. Nonischemic cardiomyopathy.  - Echo (11/15) with EF 40-45%.  - LHC/RHC (12/15): Normal coronaries, EF 30-35%, mean RA 9, PA 44/16 mean 31, unable to obtain PCWP, CI 2.5.  - Developed CHB and had Medtronic CRT-P placed in 2/16. - Echo (4/16) with EF 50-55%.  - Echo (6/17): EF 15-20%, moderate LVh, restrictive diastolic function, mild MR, RV moderately dilated and moderately decreased in function, moderate TR, PASP  46 mmHg.  - Echo (8/17): EF 40-45%, diffuse hypokinesis, mild MR.  - Echo (5/19): EF 40%, moderate LVH, moderate diastolic dysfunction, mild to moderately decreased RV systolic function, dilated IVC. 4. Complete heart block: Medtronic CRT-P in 2/16.  5. OSA: Uses CPAP 6. Asthma 7. Atrial fibrillation: Paroxysmal.  8. H/o CVA.  Possible TIA in 8/17.    - Carotid  dopplers (8/17) with 1-39% BICA stenosis.   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   FH: No cardiac problems that she knows of.   Review of systems complete and found to be negative unless listed in HPI.    Current Outpatient Medications  Medication Sig Dispense Refill  . Acetaminophen (TYLENOL) 325 MG CAPS Take 650 mg by mouth daily as needed (for shoulder pain).     . bumetanide (BUMEX) 1 MG tablet Take 3 tablets (3 mg total) by mouth 2 (two) times daily. Take additional dose on Sunday if weight 149lbs or greater. 180 tablet 3  . carvedilol (COREG) 3.125 MG tablet Take 1 tablet (3.125 mg total) by mouth 2 (two) times daily with a meal. 60 tablet 6  . cyproheptadine (PERIACTIN) 4 MG tablet Take 1 tablet (4 mg total) by mouth at bedtime. 30 tablet 0  . diclofenac sodium (VOLTAREN) 1 % GEL Apply 2 g topically 3 (three) times daily as needed (pain, inflammation). 100 g 0  . glucose blood test strip Use to test blood sugar twice daily (Accuchek Smartview) 100 each 0  . latanoprost (XALATAN) 0.005 % ophthalmic solution Place 1 drop into both eyes at bedtime.   11  . potassium chloride SA (K-DUR,KLOR-CON) 20 MEQ tablet Take 1 tablet (20 mEq total) by mouth 2 (two) times daily. (Patient taking differently: Take 20 mEq by mouth 3 (three) times daily. ) 60 tablet 3  . timolol (TIMOPTIC) 0.5 % ophthalmic solution Place 1 drop into both eyes daily.   11  . apixaban (ELIQUIS) 2.5 MG TABS tablet Take 1 tablet (2.5 mg total) by mouth 2 (two) times daily. 60 tablet 6   No current facility-administered medications for this encounter.    Vitals:   10/09/18 1158  BP: (!) 86/64  Pulse: 72  SpO2: 99%  Weight: 59.2 kg    Wt Readings from Last 3 Encounters:  10/09/18 59.2 kg  09/28/18 65.8 kg  08/20/18 65.9 kg    Physical Exam General: Elderly and chronically ill appearing. Cachectic.  HEENT: Normal Neck: Supple. JVP 6-7 cm.  Carotids 2+ bilat; no bruits. No thyromegaly or nodule noted. Cor: PMI nondisplaced. RRR, No M/G/R noted Lungs: CTAB, normal effort. Abdomen: Soft, non-tender, non-distended, no HSM. No bruits or masses. +BS  Extremities: No cyanosis, clubbing, or rash. R and LLE no edema.  Neuro: Alert & orientedx3, cranial nerves grossly intact. moves all 4 extremities w/o difficulty. Affect pleasant   Assessment/Plan: 1. Chronic Diastolic/Systolic  Heart Failure-->Worsening RV Failure on recent RHC.  - NICM. ECHO 07/21/2018 EF 35-40% severe TR  - NYHA III-IIIB symptoms. Confounded by deconditioning and general immobility.  - Volume status does not appear elevated, or dry. JVP clearly visible, though in setting of severe TR.   - No ARB/Spiro with elevated creatinine.  - Continue coreg 3.125 mg BID. She has 6.25 mg tabs in clinic and is not sure if she has different sizes at home, or how they are set up in her pill box.   2. 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  - Process started for Tafamidis, however, will discuss with MD as to whether or not to pursue treatment given advanced age and questionable benefit.  - Will refer to palliative care and see back in several weeks. At this juncture, I doubt she would receive benefit from starting on Tafamidis, and a new medicine may serve to further confuse her.   3. PAF - She saw Dr. Lovena Le a couple of weeks ago with only intermitttent Atrial Arrhythmia. - Regular on exam. Today.  - Continue coreg.  - Patient currently taking 5mg  Eliquis once daily. Explained to her that this is not the correct dose. Ordered corrected dose of Eliquis 2.5mg  BID given her age and elevated creatinine. Discussed with Pharm D via phone. Samples given for 2.5 mg BID.   4. CKD Stage IV  - Creatinine baseline 1.8-2 - BMET today.   5. H/O CHB - Has medtronic CRT-P. Stable at EP visit.   6. Anorexia - Poor oral intake. Down 20-30lbs over the last few months.  - Will  have Palliative Care team visit her at home. Suspect failure to thrive in setting of her end-stage cardiomyopathy.   7. Constipation - Lack of BM >1wk despite laxative and stool softener.  - Suspect this is due to reduced cardiac output and poor oral intake. Continue OTC regimen.   8. Hypotension - BP 86/64 today. She is not sure what medicines she is taking at home, with several discrepancies.  - Will ask Paramedicine to see her before adjusting her BP medications. May consider holding/adjusting Bumex.  Patient to have BMET, BNP and CBC today. Will ask Paramedicine and Palliative Care team to see her. Corrected Eliquis to 2.5mg  BID. Follow up w/ APP clinic in 3 weeks or sooner if needed.  Darla Lesches, Student-PA  10/09/2018   Patient seen and examined with the above-signed Student. I personally reviewed laboratory data, imaging studies and relevant notes. I independently examined the patient and formulated the important aspects of the plan. I have edited the note to reflect any of my changes or salient points. I have personally discussed the plan with the patient and/or family.  Ms. Lansdale has end-stage cardiomyopathy and likely failure to thrive. Discussed at length at today's visit, however, I worry she lacks insight into her disease process. She did discuss that she would want CPR, but would NOT want to be intubated. I encouraged her to continue these conversations with her family. She agrees to paramedicine and palliative care.   I have asked her to bring all of her medications and a family member to her next visit to continue discussions.   Greater than 50% of the 30 minute visit was spent in counseling/coordination of care regarding disease state education, salt/fluid restriction, sliding scale diuretics, and medication compliance.  Shirley Friar, PA-C  10/09/18 1:53 PM

## 2018-10-12 ENCOUNTER — Emergency Department (HOSPITAL_COMMUNITY)
Admission: EM | Admit: 2018-10-12 | Discharge: 2018-10-13 | Disposition: A | Discharge status: Home / Self Care | Payer: Medicare HMO | Attending: Emergency Medicine | Admitting: Emergency Medicine

## 2018-10-12 ENCOUNTER — Encounter (HOSPITAL_COMMUNITY): Payer: Self-pay

## 2018-10-12 ENCOUNTER — Other Ambulatory Visit: Payer: Self-pay

## 2018-10-12 DIAGNOSIS — I5043 Acute on chronic combined systolic (congestive) and diastolic (congestive) heart failure: Secondary | ICD-10-CM | POA: Diagnosis not present

## 2018-10-12 DIAGNOSIS — N189 Chronic kidney disease, unspecified: Secondary | ICD-10-CM | POA: Insufficient documentation

## 2018-10-12 DIAGNOSIS — Z79899 Other long term (current) drug therapy: Secondary | ICD-10-CM | POA: Insufficient documentation

## 2018-10-12 DIAGNOSIS — Z87891 Personal history of nicotine dependence: Secondary | ICD-10-CM | POA: Insufficient documentation

## 2018-10-12 DIAGNOSIS — E119 Type 2 diabetes mellitus without complications: Secondary | ICD-10-CM | POA: Diagnosis not present

## 2018-10-12 DIAGNOSIS — K5900 Constipation, unspecified: Secondary | ICD-10-CM | POA: Diagnosis present

## 2018-10-12 DIAGNOSIS — K5901 Slow transit constipation: Secondary | ICD-10-CM | POA: Insufficient documentation

## 2018-10-12 DIAGNOSIS — I130 Hypertensive heart and chronic kidney disease with heart failure and stage 1 through stage 4 chronic kidney disease, or unspecified chronic kidney disease: Secondary | ICD-10-CM | POA: Insufficient documentation

## 2018-10-12 NOTE — ED Triage Notes (Signed)
Pt arrives POV for eval of constipation x 2 weeks. Pt attempted to relieve it by using a suppository today and states that is has caused unbearable "burning in her bottom". Pt states she normally has a BM every 3-4 days.

## 2018-10-13 DIAGNOSIS — E119 Type 2 diabetes mellitus without complications: Secondary | ICD-10-CM | POA: Diagnosis not present

## 2018-10-13 DIAGNOSIS — Z79899 Other long term (current) drug therapy: Secondary | ICD-10-CM | POA: Diagnosis not present

## 2018-10-13 DIAGNOSIS — K5901 Slow transit constipation: Secondary | ICD-10-CM | POA: Diagnosis not present

## 2018-10-13 DIAGNOSIS — Z87891 Personal history of nicotine dependence: Secondary | ICD-10-CM | POA: Diagnosis not present

## 2018-10-13 DIAGNOSIS — N189 Chronic kidney disease, unspecified: Secondary | ICD-10-CM | POA: Diagnosis not present

## 2018-10-13 DIAGNOSIS — I5043 Acute on chronic combined systolic (congestive) and diastolic (congestive) heart failure: Secondary | ICD-10-CM | POA: Diagnosis not present

## 2018-10-13 DIAGNOSIS — I130 Hypertensive heart and chronic kidney disease with heart failure and stage 1 through stage 4 chronic kidney disease, or unspecified chronic kidney disease: Secondary | ICD-10-CM | POA: Diagnosis not present

## 2018-10-13 MED ORDER — MILK AND MOLASSES ENEMA
1.0000 | Freq: Once | RECTAL | Status: AC
  Administered 2018-10-13: 250 mL via RECTAL
  Filled 2018-10-13: qty 250

## 2018-10-13 MED ORDER — DOCUSATE SODIUM 100 MG PO CAPS: 100.0000 mg | ORAL_CAPSULE | Freq: Two times a day (BID) | ORAL | 0 refills | Status: DC

## 2018-10-13 NOTE — ED Notes (Signed)
Pt did not hold in enema

## 2018-10-13 NOTE — ED Notes (Signed)
Pt had large formed BM

## 2018-10-13 NOTE — ED Provider Notes (Signed)
[facility] EMERGENCY DEPARTMENT Provider Note   CSN: [id number] Arrival date & time: 10/12/18  1811     History   Chief Complaint Chief Complaint  Patient presents with  . Constipation   Patient gives permission to perform history and physical in front of family/friend HPI Kim Mullins is a 85 y.o. female.  The history is provided by the patient.  Constipation  Severity:  Moderate Time since last bowel movement:  1 week Timing:  Constant Progression:  Worsening Chronicity:  New Stool description:  Small Relieved by:  Nothing Worsened by:  Suppositories Associated symptoms: no abdominal pain, no diarrhea, no fever and no vomiting   Patient with history of anemia, asthma, atrial fibrillation on anticoagulation, history of heart block with pacemaker in place Resents with constipation.  She reports over a week ago she began having difficulty with bowel movements.  No fever/vomiting.  She denies any abdominal pain.  She reports that earlier in the day she used a suppository but since that time she has had "burning "in her buttocks She reports typical schedule for her has a BM every 3 days She has no other acute complaints  Past Medical History:  Diagnosis Date  . Anemia    occassionally  . Arthritis    all over.  . Asthma    Allergixc reaction to cats.  . Atrial fibrillation ([place])   . Bowel obstruction ([place])   . Chronic systolic CHF (congestive heart failure) (HCC)    EF 30-35% by cath, echo 2016 EF 50%  . Complete heart block (HCC)    a. s/p MDT CRTP pacemaker  . DCM (dilated cardiomyopathy) ([place]) 08/16/2014   normal coronary arteries on cath with EF 30-45%.  EF now 50% by echo 11/2014  . Diabetes mellitus 2006   Diet and exercise controlled.  Marland Kitchen Dyspnea   . GERD (gastroesophageal reflux disease)    occ  . Glaucoma 08/17/2018  . Hypertension   . Left tibial fracture 2007  . OSA (obstructive sleep apnea) 10/23/2014   Moderate with AHI  21/hr  . Osteoarthritis of right shoulder region 06/26/2013  . Stroke [facility])     Patient Active Problem List   Diagnosis Date Noted  . Acute on chronic heart failure ([place]) 08/19/2018  . Goals of care, counseling/discussion   . Palliative care by specialist   . Acute respiratory failure with hypoxia ([place]) 08/18/2018  . Diabetes ([place]) 08/18/2018  . CKD (chronic kidney disease) 08/18/2018  . Amyloidosis ([place]) 08/18/2018  . Acute on chronic combined systolic and diastolic heart failure ([place]) 08/17/2018  . GERD (gastroesophageal reflux disease) 08/17/2018  . Elevated troponin 08/17/2018  . Glaucoma 08/17/2018  . Hyperbilirubinemia 08/17/2018  . CHF (congestive heart failure) ([place]) 07/18/2018  . Gait abnormality 05/03/2017  . Hypotension 04/14/2016  . Hypotension due to drugs   . Chronic anticoagulation   . TIA (transient ischemic attack) 04/12/2016  . UTI (lower urinary tract infection) 04/12/2016  . AKI (acute kidney injury) ([place]) 04/12/2016  . Atrial fibrillation ([place]) 09/12/2015  . Occipital infarction ([place])   . History of stroke 06/18/2015  . Homonymous hemianopsia   . Pacemaker 01/21/2015  . OSA (obstructive sleep apnea) 10/23/2014  . Dizziness 10/21/2014  . Complete heart block ([place]) 10/06/2014  . Benign essential HTN 10/04/2014  . Acute on chronic systolic CHF (congestive heart failure) ([place]) 10/04/2014  . Abnormal cardiovascular stress test 08/27/2014  . Pulmonary HTN ([place]) 08/16/2014  . DCM (dilated cardiomyopathy) ([place] [address]) 08/16/2014  .  Cough 07/13/2014  . Esophageal stricture 06/11/2014  . Nonspecific (abnormal) findings on radiological and other examination of gastrointestinal tract 05/29/2014  . Dyspnea 04/29/2014  . Osteoarthritis of right shoulder region 06/26/2013  . DJD (degenerative joint disease) of hip 11/16/2011    Class: Present on Admission    Past Surgical History:  Procedure Laterality Date  . ABDOMINAL HYSTERECTOMY  1969  . BI-VENTRICULAR PACEMAKER  INSERTION N/A 10/07/2014   MDT CRTP implanted by Dr Lovena Le  . BRAVO [place] STUDY N/A 06/11/2014   Procedure: BRAVO [place] STUDY;  Surgeon: Inda Castle, MD;  Location: [facility] ENDOSCOPY;  Service: Endoscopy;  Laterality: N/A;  . BUNIONECTOMY  1984   Bilateral  . CARDIAC CATHETERIZATION     normal coronary arteries  . Carpal [place]  2004   Bilateral  . CATARACT EXTRACTION Right    early 2015  . corn removal  1999   Bilateral feet  . DILATION AND CURETTAGE OF UTERUS  1968  . ESOPHAGOGASTRODUODENOSCOPY (EGD) WITH PROPOFOL N/A 06/11/2014   Procedure: ESOPHAGOGASTRODUODENOSCOPY (EGD) WITH PROPOFOL;  Surgeon: Inda Castle, MD;  Location: [facility] ENDOSCOPY;  Service: Endoscopy;  Laterality: N/A;  . [place]   Surgery to fix Retainal detachment, bilateral  . JOINT REPLACEMENT    . LEFT AND RIGHT HEART CATHETERIZATION WITH CORONARY ANGIOGRAM N/A 08/29/2014   Procedure: LEFT AND RIGHT HEART CATHETERIZATION WITH CORONARY ANGIOGRAM;  Surgeon: Peter M Martinique, MD;  Location: [facility] CATH LAB;  Service: Cardiovascular;  Laterality: N/A;  . MALONEY DILATION  06/11/2014   Procedure: Venia Minks DILATION;  Surgeon: Inda Castle, MD;  Location: [facility] ENDOSCOPY;  Service: Endoscopy;;  . PILONIDAL CYST EXCISION  1959  . RIGHT HEART CATH N/A 07/24/2018   Procedure: RIGHT HEART CATH;  Surgeon: Larey Dresser, MD;  Location: [facility] CV LAB;  Service: Cardiovascular;  Laterality: N/A;  . TONSILLECTOMY  1942  . TOTAL HIP ARTHROPLASTY  11/16/2011   Procedure: TOTAL HIP ARTHROPLASTY ANTERIOR APPROACH;  Surgeon: Hessie Dibble, MD;  Location: [facility];  Service: Orthopedics;  Laterality: Right;  DEPUY  . TOTAL SHOULDER ARTHROPLASTY Right 06/26/2013   Procedure: TOTAL SHOULDER ARTHROPLASTY;  Surgeon: Johnny Bridge, MD;  Location: [facility];  Service: Orthopedics;  Laterality: Right;     OB History   No obstetric history on file.      Home Medications    Prior to Admission medications   Medication Sig Start Date  End Date Taking? Authorizing Provider  Acetaminophen (TYLENOL) 325 MG CAPS Take 650 mg by mouth daily as needed (for shoulder pain).     [provider]  apixaban (ELIQUIS) 2.5 MG TABS tablet Take 1 tablet (2.5 mg total) by mouth 2 (two) times daily. 10/09/18   Shirley Friar, PA-C  bumetanide (BUMEX) 1 MG tablet Take 3 tablets (3 mg total) by mouth 2 (two) times daily. Take additional dose on Sunday if weight 149lbs or greater. 08/20/18   Rai, Vernelle Emerald, MD  carvedilol (COREG) 3.125 MG tablet Take 1 tablet (3.125 mg total) by mouth 2 (two) times daily with a meal. 10/04/18   Larey Dresser, MD  cyproheptadine (PERIACTIN) 4 MG tablet Take 1 tablet (4 mg total) by mouth at bedtime. 08/20/18   Rai, Vernelle Emerald, MD  diclofenac sodium (VOLTAREN) 1 % GEL Apply 2 g topically 3 (three) times daily as needed (pain, inflammation). 04/16/16   Emokpae, Courage, MD  glucose blood test strip Use to test blood sugar twice daily (Accuchek  Smartview) 07/19/16   Larey Dresser, MD  latanoprost (XALATAN) 0.005 % ophthalmic solution Place 1 drop into both eyes at bedtime.  10/20/16   [provider]  potassium chloride SA (K-DUR,KLOR-CON) 20 MEQ tablet Take 1 tablet (20 mEq total) by mouth 2 (two) times daily. Patient taking differently: Take 20 mEq by mouth 3 (three) times daily.  08/14/18   Shirley Friar, PA-C  timolol (TIMOPTIC) 0.5 % ophthalmic solution Place 1 drop into both eyes daily.  07/29/15   [provider]    Family History Family History  Problem Relation Age of Onset  . Dementia Mother   . Coronary artery disease Mother   . Cancer Father        blood ? type  . Diabetes Maternal Grandfather   . Anuerysm Daughter        brain  . Colon cancer Neg Hx     Social History Social History   Tobacco Use  . Smoking status: Former Smoker    Packs/day: 1.00    Years: 45.00    Pack years: 45.00    Types: Cigarettes    Last attempt to quit: 07/24/1978     Years since quitting: 40.2  . Smokeless tobacco: Never Used  Substance Use Topics  . Alcohol use: Yes    Alcohol/week: 7.0 standard drinks    Types: 7 Glasses of wine per week    Comment: every day  . Drug use: No     Allergies   Hydrocodone; Percocet [oxycodone-acetaminophen]; Eggs or egg-derived products; Mercurial derivatives; Penicillins; and Quinine derivatives   Review of Systems Review of Systems  Constitutional: Negative for fever.  Gastrointestinal: Positive for constipation. Negative for abdominal pain, diarrhea and vomiting.  All other systems reviewed and are negative.    Physical Exam Updated Vital Signs BP (!) 152/125 (BP Location: Right Arm)   Pulse 65   Resp 18   Ht 1.6 m (5\' 3" )   Wt 58.5 kg   SpO2 100%   BMI 22.85 kg/m   Physical Exam CONSTITUTIONAL: Elderly, well-appearing, no acute distress HEAD: Normocephalic/atraumatic EYES: EOMI ENMT: Mucous membranes moist NECK: supple no meningeal signs SPINE/BACK:entire spine nontender CV: No loud murmurs LUNGS: Lungs are clear to auscultation bilaterally, no apparent distress ABDOMEN: soft, nontender, no rebound or guarding, bowel sounds noted throughout abdomen GU:no cva tenderness NEURO: Pt is awake/alert/appropriate, moves all extremitiesx4.  No facial droop.  Patient is ambulatory with assistance EXTREMITIES: pulses normal/equal, full ROM SKIN: warm, color normal PSYCH: no abnormalities of mood noted, alert and oriented to situation   ED Treatments / Results  Labs (all labs ordered are listed, but only abnormal results are displayed) Labs Reviewed - No data to display  EKG None  Radiology No results found.  Procedures Fecal disimpaction Date/Time: 10/13/2018 1:21 AM Performed by: Ripley Fraise, MD Authorized by: Ripley Fraise, MD  Consent: Verbal consent obtained. Consent given by: patient Local anesthesia used: no  Anesthesia: Local anesthesia used: no  Sedation: Patient  sedated: no  Patient tolerance: Patient tolerated the procedure well with no immediate complications Comments: Pt with large amount of stool impaction.  Multiple attempts were performed to disimpact patient.  Patient had discomfort, but overall tolerated the procedure.  No acute complications noted.      Medications Ordered in ED Medications  milk and molasses enema (250 mLs Rectal Given 10/13/18 0234)     Initial Impression / Assessment and Plan / ED Course  I have reviewed the triage  vital signs and the nursing notes.    12:18 AM Patient reports constipation for over a week. She is in no acute distress.  She has no focal abdominal tenderness After my exam she reports that she feels like she can have a large bowel movement. PT was walked to the bathroom.  Will reassess after bowel movement 1:21 AM Patient underwent a stool impaction with large amount of stool extracted.  However she still has stool impaction.  Patient agrees to have an enema.  She denies any abdominal pain.  There is no focal abdominal tenderness Friend Mr. Elgie Congo [phone] - call when ready 6:17 AM Patient had large bowel movement and feels improved.  She is awake alert in no acute distress Abdomen is soft and nontender. Will discharge home.  We will start her on a stool softener. No Signs of any bowel obstruction on clinical exam Final Clinical Impressions(s) / ED Diagnoses   Final diagnoses:  Slow transit constipation    ED Discharge Orders         Ordered    docusate sodium (COLACE) 100 MG capsule  Every 12 hours     10/13/18 0549           Ripley Fraise, MD 10/13/18 [phone]

## 2018-10-16 ENCOUNTER — Other Ambulatory Visit (HOSPITAL_COMMUNITY): Payer: Self-pay

## 2018-10-16 ENCOUNTER — Telehealth (HOSPITAL_COMMUNITY): Payer: Self-pay

## 2018-10-16 NOTE — Telephone Encounter (Signed)
Spoke with Sherwood Gambler confirming appointment for today at 12:00 noon. Patient agreed upon time and visit.

## 2018-10-16 NOTE — Progress Notes (Signed)
Paramedicine Encounter    Patient ID: Kim Mullins, female    DOB: [birth year], 85 y.o.   MRN: [id number]   Patient Care Team: Lin Landsman, MD as PCP - General (Family Medicine) Jorge Ny, LCSW as Social Worker (Licensed Clinical Social Worker)  Patient Active Problem List   Diagnosis Date Noted  . Acute on chronic heart failure ([place]) 08/19/2018  . Goals of care, counseling/discussion   . Palliative care by specialist   . Acute respiratory failure with hypoxia ([place]) 08/18/2018  . Diabetes ([place]) 08/18/2018  . CKD (chronic kidney disease) 08/18/2018  . Amyloidosis ([place]) 08/18/2018  . Acute on chronic combined systolic and diastolic heart failure ([place]) 08/17/2018  . GERD (gastroesophageal reflux disease) 08/17/2018  . Elevated troponin 08/17/2018  . Glaucoma 08/17/2018  . Hyperbilirubinemia 08/17/2018  . CHF (congestive heart failure) ([place]) 07/18/2018  . Gait abnormality 05/03/2017  . Hypotension 04/14/2016  . Hypotension due to drugs   . Chronic anticoagulation   . TIA (transient ischemic attack) 04/12/2016  . UTI (lower urinary tract infection) 04/12/2016  . AKI (acute kidney injury) ([place]) 04/12/2016  . Atrial fibrillation ([place]) 09/12/2015  . Occipital infarction ([place])   . History of stroke 06/18/2015  . Homonymous hemianopsia   . Pacemaker 01/21/2015  . OSA (obstructive sleep apnea) 10/23/2014  . Dizziness 10/21/2014  . Complete heart block ([place]) 10/06/2014  . Benign essential HTN 10/04/2014  . Acute on chronic systolic CHF (congestive heart failure) ([place]) 10/04/2014  . Abnormal cardiovascular stress test 08/27/2014  . Pulmonary HTN ([place]) 08/16/2014  . DCM (dilated cardiomyopathy) ([place]) 08/16/2014  . Cough 07/13/2014  . Esophageal stricture 06/11/2014  . Nonspecific (abnormal) findings on radiological and other examination of gastrointestinal tract 05/29/2014  . Dyspnea 04/29/2014  . Osteoarthritis of right shoulder region 06/26/2013  . DJD (degenerative  joint disease) of hip 11/16/2011    Class: Present on Admission    Current Outpatient Medications:  .  Acetaminophen (TYLENOL) 325 MG CAPS, Take 650 mg by mouth daily as needed (for shoulder pain). , Disp: , Rfl:  .  apixaban (ELIQUIS) 2.5 MG TABS tablet, Take 1 tablet (2.5 mg total) by mouth 2 (two) times daily., Disp: 60 tablet, Rfl: 6 .  bumetanide (BUMEX) 1 MG tablet, Take 3 tablets (3 mg total) by mouth 2 (two) times daily. Take additional dose on Sunday if weight 149lbs or greater., Disp: 180 tablet, Rfl: 3 .  carvedilol (COREG) 3.125 MG tablet, Take 1 tablet (3.125 mg total) by mouth 2 (two) times daily with a meal., Disp: 60 tablet, Rfl: 6 .  cyproheptadine (PERIACTIN) 4 MG tablet, Take 1 tablet (4 mg total) by mouth at bedtime., Disp: 30 tablet, Rfl: 0 .  diclofenac sodium (VOLTAREN) 1 % GEL, Apply 2 g topically 3 (three) times daily as needed (pain, inflammation)., Disp: 100 g, Rfl: 0 .  docusate sodium (COLACE) 100 MG capsule, Take 1 capsule (100 mg total) by mouth every 12 (twelve) hours., Disp: 60 capsule, Rfl: 0 .  glucose blood test strip, Use to test blood sugar twice daily ([employer]uchek Smartview), Disp: 100 each, Rfl: 0 .  latanoprost (XALATAN) 0.005 % ophthalmic solution, Place 1 drop into both eyes at bedtime. , Disp: , Rfl: 11 .  potassium chloride SA (K-DUR,KLOR-CON) 20 MEQ tablet, Take 1 tablet (20 mEq total) by mouth 2 (two) times daily. (Patient taking differently: Take 20 mEq by mouth 3 (three) times daily. ), Disp: 60 tablet, Rfl: 3 .  timolol (TIMOPTIC) 0.5 %  ophthalmic solution, Place 1 drop into both eyes daily. , Disp: , Rfl: 11 Allergies  Allergen Reactions  . Hydrocodone Other (See Comments)    Took away all energy and made her stop eating food for short time  . Percocet [Oxycodone-Acetaminophen] Other (See Comments)    Too away all energy and made her stop eating food for short time  . Eggs Or Egg-Derived Products Hives and Rash  . Mercurial Derivatives Itching  and Rash  . Penicillins Hives and Rash    Has patient had a PCN reaction causing immediate rash, facial/tongue/throat swelling, SOB or lightheadedness with hypotension: Yes Has patient had a PCN reaction causing severe rash involving mucus membranes or skin necrosis: No Has patient had a PCN reaction that required hospitalization: No Has patient had a PCN reaction occurring within the last 10 years: Yes If all of the above answers are "NO", then may proceed with Cephalosporin use.  . Quinine Derivatives Hives and Rash     Social History   Socioeconomic History  . Marital status: Widowed    Spouse name: Not on file  . Number of children: 0  . Years of education: Not on file  . Highest education level: Doctorate  Occupational History  . Occupation: retired  Scientific laboratory technician  . Financial resource strain: Not hard at all  . Food insecurity:    Worry: Never true    Inability: Never true  . Transportation needs:    Medical: No    Non-medical: No  Tobacco Use  . Smoking status: Former Smoker    Packs/day: 1.00    Years: 45.00    Pack years: 45.00    Types: Cigarettes    Last attempt to quit: 07/24/1978    Years since quitting: 40.2  . Smokeless tobacco: Never Used  Substance and Sexual Activity  . Alcohol use: Yes    Alcohol/week: 7.0 standard drinks    Types: 7 Glasses of wine per week    Comment: every day  . Drug use: No  . Sexual activity: Not Currently  Lifestyle  . Physical activity:    Days per week: Not on file    Minutes per session: Not on file  . Stress: Not on file  Relationships  . Social connections:    Talks on phone: Not on file    Gets together: Not on file    Attends religious service: Not on file    Active member of club or organization: Not on file    Attends meetings of clubs or organizations: Not on file    Relationship status: Not on file  . Intimate partner violence:    Fear of current or ex partner: Not on file    Emotionally abused: Not on  file    Physically abused: Not on file    Forced sexual activity: Not on file  Other Topics Concern  . Not on file  Social History Narrative   Lives alone    Physical Exam Vitals signs reviewed.  Constitutional:      Comments: Rapid weight loss per patient and granddaughter.   HENT:     Head: Normocephalic.  Eyes:     Pupils: Pupils are equal, round, and reactive to light.  Neck:     Musculoskeletal: Normal range of motion and neck supple. No neck rigidity or muscular tenderness.  Cardiovascular:     Rate and Rhythm: Normal rate and regular rhythm.     Pulses: Normal pulses.     Heart  sounds: Normal heart sounds.  Pulmonary:     Effort: Pulmonary effort is normal. No respiratory distress.     Breath sounds: Normal breath sounds. No stridor.  Abdominal:     General: Abdomen is flat. There is no distension.     Palpations: Abdomen is soft. There is no mass.     Tenderness: There is no abdominal tenderness. There is no guarding or rebound.     Comments: Complains of constipation.   Musculoskeletal: Normal range of motion.        General: No swelling.     Right lower leg: No edema.     Left lower leg: No edema.  Skin:    General: Skin is warm and dry.     Capillary Refill: Capillary refill takes less than 2 seconds.  Neurological:     Mental Status: She is alert.  Psychiatric:        Mood and Affect: Mood normal.     Comments: Complains of weakness and feeling tired.      Last weight as of 09/28/18: 145lbs  Weight today: 128lbs CBG: 83     Today was an initial visit with Mrs. Hankin. Patient resides in a three bedroom home alone with her closest relative being her granddaughter who lives in [place]. Granddaughter was present today in town for the week and was there for appointment. Patient was sitting in a chair CAOX4, cool and dry. Patient appeared to be in no obvious distress. Patient stated she was just waking up around 1130 and was feeling tired and weak today.  Patient's granddaughter and patient expressed a sincere concern with the patient's rapid weight loss over the last few months and noticed her complaining of feeling cold and having a decrease in appetite as well as a decrease in energy to complete daily tasks. Patient's granddaughter mentioned an interest in getting the patient home health care and home therapy to assist in getting her active and having an increase in her energy. Granddaughter stated the patient is able to walk and get herself food in the home and take her medications but has no energy or drive in doing so. Patient also complained of constant sinus drainage and needing a replacement for her CPAP face mask. (Same was reported to [facility] and replacement will be ordered via [facility]). Medications reviewed and organized in pill box. Patient education provided on medications. Physcial Exam completed. Patient weighed and noted her being able to walk without difficulty but lost her balance while weighing on the scale. Patient able to ambulate back to chair without difficulty. Visit completed and next visit scheduled for one week at Clinic visit. Patient will be reminded of same and to bring along pill boxes and pills.   Future Appointments  Date Time Provider [place]  10/23/2018 12:00 PM [facility] PA/NP [facility] None  12/28/2018  7:05 AM [facility] DEVICE REMOTES [facility]     ACTION: Home visit completed Next visit planned for One Week.

## 2018-10-16 NOTE — Progress Notes (Unsigned)
Paramedicine Encounter    Patient ID: Kim Mullins, female    DOB: 21-Aug-1930, 85 y.o.   MRN: [id number]   Patient Care Team: Lin Landsman, MD as PCP - General (Family Medicine) Jorge Ny, LCSW as Social Worker (Licensed Clinical Social Worker)  Patient Active Problem List   Diagnosis Date Noted  . Acute on chronic heart failure ([place]) 08/19/2018  . Goals of care, counseling/discussion   . Palliative care by specialist   . Acute respiratory failure with hypoxia ([place]) 08/18/2018  . Diabetes ([place]) 08/18/2018  . CKD (chronic kidney disease) 08/18/2018  . Amyloidosis ([place]) 08/18/2018  . Acute on chronic combined systolic and diastolic heart failure ([place]) 08/17/2018  . GERD (gastroesophageal reflux disease) 08/17/2018  . Elevated troponin 08/17/2018  . Glaucoma 08/17/2018  . Hyperbilirubinemia 08/17/2018  . CHF (congestive heart failure) ([place]) 07/18/2018  . Gait abnormality 05/03/2017  . Hypotension 04/14/2016  . Hypotension due to drugs   . Chronic anticoagulation   . TIA (transient ischemic attack) 04/12/2016  . UTI (lower urinary tract infection) 04/12/2016  . AKI (acute kidney injury) ([place]) 04/12/2016  . Atrial fibrillation ([place]) 09/12/2015  . Occipital infarction ([place])   . History of stroke 06/18/2015  . Homonymous hemianopsia   . Pacemaker 01/21/2015  . OSA (obstructive sleep apnea) 10/23/2014  . Dizziness 10/21/2014  . Complete heart block ([place]) 10/06/2014  . Benign essential HTN 10/04/2014  . Acute on chronic systolic CHF (congestive heart failure) ([place]) 10/04/2014  . Abnormal cardiovascular stress test 08/27/2014  . Pulmonary HTN ([place]) 08/16/2014  . DCM (dilated cardiomyopathy) ([place]) 08/16/2014  . Cough 07/13/2014  . Esophageal stricture 06/11/2014  . Nonspecific (abnormal) findings on radiological and other examination of gastrointestinal tract 05/29/2014  . Dyspnea 04/29/2014  . Osteoarthritis of right shoulder region 06/26/2013  . DJD (degenerative  joint disease) of hip 11/16/2011    Class: Present on Admission    Current Outpatient Medications:  .  Acetaminophen (TYLENOL) 325 MG CAPS, Take 650 mg by mouth daily as needed (for shoulder pain). , Disp: , Rfl:  .  apixaban (ELIQUIS) 2.5 MG TABS tablet, Take 1 tablet (2.5 mg total) by mouth 2 (two) times daily., Disp: 60 tablet, Rfl: 6 .  bumetanide (BUMEX) 1 MG tablet, Take 3 tablets (3 mg total) by mouth 2 (two) times daily. Take additional dose on Sunday if weight 149lbs or greater., Disp: 180 tablet, Rfl: 3 .  carvedilol (COREG) 3.125 MG tablet, Take 1 tablet (3.125 mg total) by mouth 2 (two) times daily with a meal., Disp: 60 tablet, Rfl: 6 .  cyproheptadine (PERIACTIN) 4 MG tablet, Take 1 tablet (4 mg total) by mouth at bedtime., Disp: 30 tablet, Rfl: 0 .  diclofenac sodium (VOLTAREN) 1 % GEL, Apply 2 g topically 3 (three) times daily as needed (pain, inflammation)., Disp: 100 g, Rfl: 0 .  docusate sodium (COLACE) 100 MG capsule, Take 1 capsule (100 mg total) by mouth every 12 (twelve) hours., Disp: 60 capsule, Rfl: 0 .  glucose blood test strip, Use to test blood sugar twice daily (Accuchek Smartview), Disp: 100 each, Rfl: 0 .  latanoprost (XALATAN) 0.005 % ophthalmic solution, Place 1 drop into both eyes at bedtime. , Disp: , Rfl: 11 .  potassium chloride SA (K-DUR,KLOR-CON) 20 MEQ tablet, Take 1 tablet (20 mEq total) by mouth 2 (two) times daily. (Patient taking differently: Take 20 mEq by mouth 3 (three) times daily. ), Disp: 60 tablet, Rfl: 3 .  timolol (TIMOPTIC) 0.5 %  ophthalmic solution, Place 1 drop into both eyes daily. , Disp: , Rfl: 11 Allergies  Allergen Reactions  . Hydrocodone Other (See Comments)    Took away all energy and made her stop eating food for short time  . Percocet [Oxycodone-Acetaminophen] Other (See Comments)    Too away all energy and made her stop eating food for short time  . Eggs Or Egg-Derived Products Hives and Rash  . Mercurial Derivatives Itching  and Rash  . Penicillins Hives and Rash    Has patient had a PCN reaction causing immediate rash, facial/tongue/throat swelling, SOB or lightheadedness with hypotension: Yes Has patient had a PCN reaction causing severe rash involving mucus membranes or skin necrosis: No Has patient had a PCN reaction that required hospitalization: No Has patient had a PCN reaction occurring within the last 10 years: Yes If all of the above answers are "NO", then may proceed with Cephalosporin use.  . Quinine Derivatives Hives and Rash     Social History   Socioeconomic History  . Marital status: Widowed    Spouse name: Not on file  . Number of children: 0  . Years of education: Not on file  . Highest education level: Doctorate  Occupational History  . Occupation: retired  Scientific laboratory technician  . Financial resource strain: Not hard at all  . Food insecurity:    Worry: Never true    Inability: Never true  . Transportation needs:    Medical: No    Non-medical: No  Tobacco Use  . Smoking status: Former Smoker    Packs/day: 1.00    Years: 45.00    Pack years: 45.00    Types: Cigarettes    Last attempt to quit: 07/24/1978    Years since quitting: 40.2  . Smokeless tobacco: Never Used  Substance and Sexual Activity  . Alcohol use: Yes    Alcohol/week: 7.0 standard drinks    Types: 7 Glasses of wine per week    Comment: every day  . Drug use: No  . Sexual activity: Not Currently  Lifestyle  . Physical activity:    Days per week: Not on file    Minutes per session: Not on file  . Stress: Not on file  Relationships  . Social connections:    Talks on phone: Not on file    Gets together: Not on file    Attends religious service: Not on file    Active member of club or organization: Not on file    Attends meetings of clubs or organizations: Not on file    Relationship status: Not on file  . Intimate partner violence:    Fear of current or ex partner: Not on file    Emotionally abused: Not on  file    Physically abused: Not on file    Forced sexual activity: Not on file  Other Topics Concern  . Not on file  Social History Narrative   Lives alone    Physical Exam Vitals signs reviewed.  HENT:     Head: Normocephalic.  Eyes:     Pupils: Pupils are equal, round, and reactive to light.  Neck:     Musculoskeletal: Normal range of motion and neck supple.  Cardiovascular:     Rate and Rhythm: Normal rate and regular rhythm.     Pulses: Normal pulses.  Pulmonary:     Effort: Pulmonary effort is normal. No respiratory distress.  Abdominal:     General: Abdomen is flat.  Palpations: Abdomen is soft.  Musculoskeletal: Normal range of motion.        General: No swelling.  Skin:    General: Skin is warm.     Capillary Refill: Capillary refill takes less than 2 seconds.  Neurological:     General: No focal deficit present.     Mental Status: She is alert.  Psychiatric:        Mood and Affect: Mood normal.      Weight: 128lbs  CBG: 84   Arrived to find    Future Appointments  Date Time Provider [place]  10/23/2018 12:00 PM [facility] PA/NP [facility] None  12/28/2018  7:05 AM [facility] DEVICE REMOTES [facility]     ACTION: {Paramed [phone]}

## 2018-10-17 ENCOUNTER — Other Ambulatory Visit (HOSPITAL_COMMUNITY): Payer: Self-pay

## 2018-10-17 NOTE — Progress Notes (Signed)
Error

## 2018-10-18 ENCOUNTER — Telehealth: Payer: Self-pay | Admitting: *Deleted

## 2018-10-18 NOTE — Telephone Encounter (Signed)
Patient called back and made an appointment for Friday 10/20/18.

## 2018-10-18 NOTE — Addendum Note (Signed)
Encounter addended by: Shirley Friar, PA-C on: 10/18/2018 10:29 AM  Actions taken: LOS modified

## 2018-10-18 NOTE — Telephone Encounter (Signed)
Late Entry: Tammy Sours ([facility]) called on 10/18/18 to ask if Dr Radford Pax could write a prescription for supplies for this patient. Patient has not been seen in our office sine 01/21/2016 and has to make an appointment to see Dr Radford Pax before she can get a prescription for her supplies. Reached out to [place] but she is out of the office from 10/18/18-10/24/18, then I reached out to the patient and lmtcb.

## 2018-10-20 ENCOUNTER — Ambulatory Visit: Payer: Medicare HMO | Admitting: Cardiology

## 2018-10-20 ENCOUNTER — Encounter: Payer: Self-pay | Admitting: Cardiology

## 2018-10-20 VITALS — BP 88/68 | HR 68 | Ht 63.0 in | Wt 131.2 lb

## 2018-10-20 DIAGNOSIS — E669 Obesity, unspecified: Secondary | ICD-10-CM

## 2018-10-20 DIAGNOSIS — G4733 Obstructive sleep apnea (adult) (pediatric): Secondary | ICD-10-CM | POA: Diagnosis not present

## 2018-10-20 DIAGNOSIS — I10 Essential (primary) hypertension: Secondary | ICD-10-CM | POA: Diagnosis not present

## 2018-10-20 NOTE — Progress Notes (Signed)
Cardiology Office Note:    Date:  10/20/2018   ID:  Kim Mullins, DOB [birth year], MRN [id number]  PCP:  Lin Landsman, MD  Cardiologist:  No primary care provider on file.    Referring MD: Lin Landsman, MD   Chief Complaint  Patient presents with  . Sleep Apnea  . Hypertension    History of Present Illness:    Kim Mullins is a 85 y.o. female with a hx of moderate OSA with an AHI of 21/hr and underwent BIPAP titration to 10/6cm H2O. I have not seen her in almost 3 years.  She is now here for sleep follow-up.  She is doing well with her CPAP device.  She tolerates the mask and feels the pressure is adequate.  Since going on CPAP she feels rested in the am and has no significant daytime sleepiness.  She denies any significant mouth or nasal dryness or nasal congestion.  She does not think that he snores.     Past Medical History:  Diagnosis Date  . Anemia    occassionally  . Arthritis    all over.  . Asthma    Allergixc reaction to cats.  . Atrial fibrillation ([place])   . Bowel obstruction ([place])   . Chronic systolic CHF (congestive heart failure) (HCC)    EF 30-35% by cath, echo 2016 EF 50%  . Complete heart block (HCC)    a. s/p MDT CRTP pacemaker  . DCM (dilated cardiomyopathy) ([place]) 08/16/2014   normal coronary arteries on cath with EF 30-45%.  EF now 50% by echo 11/2014  . Diabetes mellitus 2006   Diet and exercise controlled.  Marland Kitchen Dyspnea   . GERD (gastroesophageal reflux disease)    occ  . Glaucoma 08/17/2018  . Hypertension   . Left tibial fracture 2007  . OSA (obstructive sleep apnea) 10/23/2014   Moderate with AHI 21/hr  . Osteoarthritis of right shoulder region 06/26/2013  . Stroke [facility])     Past Surgical History:  Procedure Laterality Date  . ABDOMINAL HYSTERECTOMY  1969  . BI-VENTRICULAR PACEMAKER INSERTION N/A 10/07/2014   MDT CRTP implanted by Dr Lovena Le  . BRAVO [place] STUDY N/A 06/11/2014   Procedure: BRAVO [place] STUDY;  Surgeon: Inda Castle, MD;  Location: [facility] ENDOSCOPY;  Service: Endoscopy;  Laterality: N/A;  . BUNIONECTOMY  1984   Bilateral  . CARDIAC CATHETERIZATION     normal coronary arteries  . Carpal [place]  2004   Bilateral  . CATARACT EXTRACTION Right    early 2015  . corn removal  1999   Bilateral feet  . DILATION AND CURETTAGE OF UTERUS  1968  . ESOPHAGOGASTRODUODENOSCOPY (EGD) WITH PROPOFOL N/A 06/11/2014   Procedure: ESOPHAGOGASTRODUODENOSCOPY (EGD) WITH PROPOFOL;  Surgeon: Inda Castle, MD;  Location: [facility] END[facility];  Service: Endoscopy;  Laterality: N/A;  . [place]   Surgery to fix Retainal detachment, bilateral  . JOINT REPLACEMENT    . LEFT AND RIGHT HEART CATHETERIZATION WITH CORONARY ANGIOGRAM N/A 08/29/2014   Procedure: LEFT AND RIGHT HEART CATHETERIZATION WITH CORONARY ANGIOGRAM;  Surgeon: Peter M Martinique, MD;  Location: [facility] CATH LAB;  Service: Cardiovascular;  Laterality: N/A;  . MALONEY DILATION  06/11/2014   Procedure: Venia Minks DILATION;  Surgeon: Inda Castle, MD;  Location: [facility] ENDOSCOPY;  Service: Endoscopy;;  . PILONIDAL CYST EXCISION  1959  . RIGHT HEART CATH N/A 07/24/2018   Procedure: RIGHT HEART CATH;  Surgeon: Larey Dresser, MD;  Location: [facility]  INVASIVE CV LAB;  Service: Cardiovascular;  Laterality: N/A;  . TONSILLECTOMY  1942  . TOTAL HIP ARTHROPLASTY  11/16/2011   Procedure: TOTAL HIP ARTHROPLASTY ANTERIOR APPROACH;  Surgeon: Hessie Dibble, MD;  Location: [place];  Service: Orthopedics;  Laterality: Right;  DEPUY  . TOTAL SHOULDER ARTHROPLASTY Right 06/26/2013   Procedure: TOTAL SHOULDER ARTHROPLASTY;  Surgeon: Johnny Bridge, MD;  Location: [place];  Service: Orthopedics;  Laterality: Right;    Current Medications: No outpatient medications have been marked as taking for the 10/20/18 encounter (Office Visit) with Sueanne Margarita, MD.     Allergies:   Hydrocodone; Percocet [oxycodone-acetaminophen]; Eggs or egg-derived products; Mercurial derivatives; Penicillins; and  Quinine derivatives   Social History   Socioeconomic History  . Marital status: Widowed    Spouse name: Not on file  . Number of children: 0  . Years of education: Not on file  . Highest education level: Doctorate  Occupational History  . Occupation: retired  Scientific laboratory technician  . Financial resource strain: Not hard at all  . Food insecurity:    Worry: Never true    Inability: Never true  . Transportation needs:    Medical: No    Non-medical: No  Tobacco Use  . Smoking status: Former Smoker    Packs/day: 1.00    Years: 45.00    Pack years: 45.00    Types: Cigarettes    Last attempt to quit: 07/24/1978    Years since quitting: 40.2  . Smokeless tobacco: Never Used  Substance and Sexual Activity  . Alcohol use: Yes    Alcohol/week: 7.0 standard drinks    Types: 7 Glasses of wine per week    Comment: every day  . Drug use: No  . Sexual activity: Not Currently  Lifestyle  . Physical activity:    Days per week: Not on file    Minutes per session: Not on file  . Stress: Not on file  Relationships  . Social connections:    Talks on phone: Not on file    Gets together: Not on file    Attends religious service: Not on file    Active member of club or organization: Not on file    Attends meetings of clubs or organizations: Not on file    Relationship status: Not on file  Other Topics Concern  . Not on file  Social History Narrative   Lives alone     Family History: The patient's family history includes Anuerysm in her daughter; Cancer in her father; Coronary artery disease in her mother; Dementia in her mother; Diabetes in her maternal grandfather. There is no history of Colon cancer.  ROS:   Please see the history of present illness.    ROS  All other systems reviewed and negative.   EKGs/Labs/Other Studies Reviewed:    The following studies were reviewed today: none  EKG:  EKG is not ordered today.   Recent Labs: 08/20/2018: ALT 13 10/09/2018: B Natriuretic  Peptide 1,424.7; BUN 17; Creatinine, Ser 1.72; Hemoglobin 14.0; Platelets 174; Potassium 3.7; Sodium 137   Recent Lipid Panel    Component Value Date/Time   CHOL 90 04/13/2016 0438   TRIG 69 04/13/2016 0438   HDL 30 (L) 04/13/2016 0438   CHOLHDL 3.0 04/13/2016 0438   VLDL 14 04/13/2016 0438   LDLCALC 46 04/13/2016 0438    Physical Exam:    VS:  There were no vitals taken for this visit.    Wt Readings  from Last 3 Encounters:  10/16/18 128 lb (58.1 kg)  10/12/18 129 lb (58.5 kg)  10/09/18 130 lb 9.6 oz (59.2 kg)     GEN:  Well nourished, well developed in no acute distress HEENT: Normal NECK: No JVD; No carotid bruits LYMPHATICS: No lymphadenopathy CARDIAC: RRR, no murmurs, rubs, gallops RESPIRATORY:  Clear to auscultation without rales, wheezing or rhonchi  ABDOMEN: Soft, non-tender, non-distended MUSCULOSKELETAL:  No edema; No deformity  SKIN: Warm and dry NEUROLOGIC:  Alert and oriented x 3 PSYCHIATRIC:  Normal affect   ASSESSMENT:    1. OSA (obstructive sleep apnea)   2. Benign essential HTN   3. Obesity (BMI 30-39.9)    PLAN:    In order of problems listed above:  1.  OSA - the patient is tolerating PAP therapy well without any problems. The PAP download was reviewed today and showed an AHI of 20.9/hr on auto BIPAP  with 7% compliance in using more than 4 hours nightly.  The patient has been using and benefiting from PAP use and will continue to benefit from therapy. I have encouraged her to be more compliant with her device.  She only used it 4/30 days this past month.  She needs new supplies which I will order.  I am also ordering a new mask since her is leaking a lot and may be the reason for the high AHI.  I have encouraged her to avoid sleeping supine and I will get a download in 4 weeks.   2.  HTN - BP is controlled on current meds.  She will continue on Carvedilol 3.125mg  BID.  3.  Obesity - I have encouraged him to get into a routine exercise program and  cut back on carbs and portions.     Medication Adjustments/Labs and Tests Ordered: Current medicines are reviewed at length with the patient today.  Concerns regarding medicines are outlined above.  No orders of the defined types were placed in this encounter.  No orders of the defined types were placed in this encounter.   Signed, Fransico Him, MD  10/20/2018 4:12 PM    [place]

## 2018-10-20 NOTE — Patient Instructions (Signed)

## 2018-10-23 ENCOUNTER — Telehealth (HOSPITAL_COMMUNITY): Payer: Self-pay

## 2018-10-23 ENCOUNTER — Telehealth: Payer: Self-pay | Admitting: *Deleted

## 2018-10-23 ENCOUNTER — Encounter (HOSPITAL_COMMUNITY): Payer: Self-pay

## 2018-10-23 ENCOUNTER — Other Ambulatory Visit (HOSPITAL_COMMUNITY): Payer: Self-pay

## 2018-10-23 ENCOUNTER — Ambulatory Visit (HOSPITAL_COMMUNITY)
Admission: RE | Admit: 2018-10-23 | Discharge: 2018-10-23 | Disposition: A | Discharge status: Home / Self Care | Payer: Medicare HMO | Source: Ambulatory Visit | Attending: Cardiology | Admitting: Cardiology

## 2018-10-23 ENCOUNTER — Encounter (HOSPITAL_COMMUNITY): Payer: Self-pay | Admitting: *Deleted

## 2018-10-23 VITALS — BP 100/68 | HR 80 | Wt 129.2 lb

## 2018-10-23 DIAGNOSIS — I5022 Chronic systolic (congestive) heart failure: Secondary | ICD-10-CM

## 2018-10-23 DIAGNOSIS — E854 Organ-limited amyloidosis: Secondary | ICD-10-CM

## 2018-10-23 DIAGNOSIS — I480 Paroxysmal atrial fibrillation: Secondary | ICD-10-CM | POA: Diagnosis not present

## 2018-10-23 DIAGNOSIS — I428 Other cardiomyopathies: Secondary | ICD-10-CM | POA: Diagnosis not present

## 2018-10-23 DIAGNOSIS — E1122 Type 2 diabetes mellitus with diabetic chronic kidney disease: Secondary | ICD-10-CM | POA: Diagnosis not present

## 2018-10-23 DIAGNOSIS — Z7901 Long term (current) use of anticoagulants: Secondary | ICD-10-CM | POA: Diagnosis not present

## 2018-10-23 DIAGNOSIS — K5900 Constipation, unspecified: Secondary | ICD-10-CM | POA: Insufficient documentation

## 2018-10-23 DIAGNOSIS — R630 Anorexia: Secondary | ICD-10-CM | POA: Insufficient documentation

## 2018-10-23 DIAGNOSIS — I43 Cardiomyopathy in diseases classified elsewhere: Secondary | ICD-10-CM

## 2018-10-23 DIAGNOSIS — N184 Chronic kidney disease, stage 4 (severe): Secondary | ICD-10-CM | POA: Insufficient documentation

## 2018-10-23 DIAGNOSIS — Z950 Presence of cardiac pacemaker: Secondary | ICD-10-CM

## 2018-10-23 DIAGNOSIS — G4733 Obstructive sleep apnea (adult) (pediatric): Secondary | ICD-10-CM | POA: Diagnosis not present

## 2018-10-23 DIAGNOSIS — I5042 Chronic combined systolic (congestive) and diastolic (congestive) heart failure: Secondary | ICD-10-CM | POA: Insufficient documentation

## 2018-10-23 DIAGNOSIS — Z79899 Other long term (current) drug therapy: Secondary | ICD-10-CM | POA: Diagnosis not present

## 2018-10-23 DIAGNOSIS — J45909 Unspecified asthma, uncomplicated: Secondary | ICD-10-CM | POA: Diagnosis not present

## 2018-10-23 DIAGNOSIS — Z87891 Personal history of nicotine dependence: Secondary | ICD-10-CM | POA: Diagnosis not present

## 2018-10-23 MED ORDER — BUMETANIDE 1 MG PO TABS: 2.0000 mg | ORAL_TABLET | Freq: Two times a day (BID) | ORAL | 3 refills | Status: DC

## 2018-10-23 NOTE — Progress Notes (Signed)
Referral for palliative care faxed to [place] per Oda Kilts, [place]

## 2018-10-23 NOTE — Telephone Encounter (Signed)
Spoke with Garen Lah and reminded her of her appointment today at 15. Reminded patient to bring pills and pill box. Call completed.

## 2018-10-23 NOTE — Addendum Note (Signed)
Encounter addended by: Kerry Dory, CMA on: 10/23/2018 1:57 PM  Actions taken: Order list changed

## 2018-10-23 NOTE — Progress Notes (Signed)
Paramedicine Encounter    Patient ID: Kim Mullins, female    DOB: 1929-11-14, 85 y.o.   MRN: [id number]   Patient Care Team: Lin Landsman, MD as PCP - General (Family Medicine) Jorge Ny, LCSW as Social Worker (Licensed Clinical Social Worker)  Patient Active Problem List   Diagnosis Date Noted  . Obesity (BMI 30-39.9) 10/20/2018  . Acute on chronic heart failure ([place]) 08/19/2018  . Goals of care, counseling/discussion   . Palliative care by specialist   . Acute respiratory failure with hypoxia ([place]) 08/18/2018  . Diabetes ([place]) 08/18/2018  . CKD (chronic kidney disease) 08/18/2018  . Amyloidosis ([place]) 08/18/2018  . Acute on chronic combined systolic and diastolic heart failure ([place]) 08/17/2018  . GERD (gastroesophageal reflux disease) 08/17/2018  . Elevated troponin 08/17/2018  . Glaucoma 08/17/2018  . Hyperbilirubinemia 08/17/2018  . CHF (congestive heart failure) ([place]) 07/18/2018  . Gait abnormality 05/03/2017  . Hypotension 04/14/2016  . Hypotension due to drugs   . Chronic anticoagulation   . TIA (transient ischemic attack) 04/12/2016  . UTI (lower urinary tract infection) 04/12/2016  . AKI (acute kidney injury) ([place]) 04/12/2016  . Atrial fibrillation ([place]) 09/12/2015  . Occipital infarction ([place])   . History of stroke 06/18/2015  . Homonymous hemianopsia   . Pacemaker 01/21/2015  . OSA (obstructive sleep apnea) 10/23/2014  . Dizziness 10/21/2014  . Complete heart block ([place]) 10/06/2014  . Benign essential HTN 10/04/2014  . Acute on chronic systolic CHF (congestive heart failure) ([place]) 10/04/2014  . Abnormal cardiovascular stress test 08/27/2014  . Pulmonary HTN ([place]) 08/16/2014  . DCM (dilated cardiomyopathy) ([place]) 08/16/2014  . Cough 07/13/2014  . Esophageal stricture 06/11/2014  . Nonspecific (abnormal) findings on radiological and other examination of gastrointestinal tract 05/29/2014  . Dyspnea 04/29/2014  . Osteoarthritis of right shoulder  region 06/26/2013  . DJD (degenerative joint disease) of hip 11/16/2011    Class: Present on Admission    Current Outpatient Medications:  .  Acetaminophen (TYLENOL) 325 MG CAPS, Take 650 mg by mouth daily as needed (for shoulder pain). , Disp: , Rfl:  .  apixaban (ELIQUIS) 2.5 MG TABS tablet, Take 1 tablet (2.5 mg total) by mouth 2 (two) times daily., Disp: 60 tablet, Rfl: 6 .  cyproheptadine (PERIACTIN) 4 MG tablet, Take 1 tablet (4 mg total) by mouth at bedtime., Disp: 30 tablet, Rfl: 0 .  diclofenac sodium (VOLTAREN) 1 % GEL, Apply 2 g topically 3 (three) times daily as needed (pain, inflammation)., Disp: 100 g, Rfl: 0 .  docusate sodium (COLACE) 100 MG capsule, Take 1 capsule (100 mg total) by mouth every 12 (twelve) hours., Disp: 60 capsule, Rfl: 0 .  glucose blood test strip, Use to test blood sugar twice daily (Accuchek Smartview), Disp: 100 each, Rfl: 0 .  latanoprost (XALATAN) 0.005 % ophthalmic solution, Place 1 drop into both eyes at bedtime. , Disp: , Rfl: 11 .  potassium chloride SA (K-DUR,KLOR-CON) 20 MEQ tablet, Take 1 tablet (20 mEq total) by mouth 2 (two) times daily. (Patient taking differently: Take 20 mEq by mouth 3 (three) times daily. ), Disp: 60 tablet, Rfl: 3 .  timolol (TIMOPTIC) 0.5 % ophthalmic solution, Place 1 drop into both eyes daily. , Disp: , Rfl: 11 .  bumetanide (BUMEX) 1 MG tablet, Take 2 tablets (2 mg total) by mouth 2 (two) times daily. Take additional dose on Sunday if weight 149lbs or greater., Disp: 180 tablet, Rfl: 3 .  NON FORMULARY, impax 4 mg  daily at bedtime, Disp: , Rfl:  Allergies  Allergen Reactions  . Hydrocodone Other (See Comments)    Took away all energy and made her stop eating food for short time  . Percocet [Oxycodone-Acetaminophen] Other (See Comments)    Too away all energy and made her stop eating food for short time  . Eggs Or Egg-Derived Products Hives and Rash  . Mercurial Derivatives Itching and Rash  . Penicillins Hives and  Rash    Has patient had a PCN reaction causing immediate rash, facial/tongue/throat swelling, SOB or lightheadedness with hypotension: Yes Has patient had a PCN reaction causing severe rash involving mucus membranes or skin necrosis: No Has patient had a PCN reaction that required hospitalization: No Has patient had a PCN reaction occurring within the last 10 years: Yes If all of the above answers are "NO", then may proceed with Cephalosporin use.  . Quinine Derivatives Hives and Rash     Social History   Socioeconomic History  . Marital status: Widowed    Spouse name: Not on file  . Number of children: 0  . Years of education: Not on file  . Highest education level: Doctorate  Occupational History  . Occupation: retired  Scientific laboratory technician  . Financial resource strain: Not hard at all  . Food insecurity:    Worry: Never true    Inability: Never true  . Transportation needs:    Medical: No    Non-medical: No  Tobacco Use  . Smoking status: Former Smoker    Packs/day: 1.00    Years: 45.00    Pack years: 45.00    Types: Cigarettes    Last attempt to quit: 07/24/1978    Years since quitting: 40.2  . Smokeless tobacco: Never Used  Substance and Sexual Activity  . Alcohol use: Yes    Alcohol/week: 7.0 standard drinks    Types: 7 Glasses of wine per week    Comment: every day  . Drug use: No  . Sexual activity: Not Currently  Lifestyle  . Physical activity:    Days per week: Not on file    Minutes per session: Not on file  . Stress: Not on file  Relationships  . Social connections:    Talks on phone: Not on file    Gets together: Not on file    Attends religious service: Not on file    Active member of club or organization: Not on file    Attends meetings of clubs or organizations: Not on file    Relationship status: Not on file  . Intimate partner violence:    Fear of current or ex partner: Not on file    Emotionally abused: Not on file    Physically abused: Not on  file    Forced sexual activity: Not on file  Other Topics Concern  . Not on file  Social History Narrative   Lives alone    Physical Exam Vitals signs reviewed.  Eyes:     Pupils: Pupils are equal, round, and reactive to light.  Neck:     Musculoskeletal: Normal range of motion.  Cardiovascular:     Rate and Rhythm: Normal rate and regular rhythm.  Pulmonary:     Effort: Pulmonary effort is normal. No respiratory distress.     Breath sounds: No stridor. No wheezing, rhonchi or rales.  Abdominal:     General: Abdomen is flat.  Musculoskeletal: Normal range of motion.        General: No  swelling.     Right lower leg: No edema.     Left lower leg: No edema.  Skin:    General: Skin is warm and dry.     Capillary Refill: Capillary refill takes less than 2 seconds.  Neurological:     Mental Status: She is alert.  Psychiatric:        Mood and Affect: Mood normal.     Today was a follow up with Kim Mullins in the clinic. Amy Clegg saw Kim Mullins today and discussed her issues with constipation and weight loss. Amy suggested palliative care options for them to come in and do an evaluation on Kim Mullins. Kim Mullins agreed to same. Amy also told the patient to continue over the counter options with constipation issues such as Ducolax, Colace and Enemas. Patient agreed and understood same. Amy reviewed medications and reduced her Bumex to 2mg  twice daily and removed Carvedilol from her medication list. Confirmed Potassium 71meq's twice daily. Patient's vitals were obtained by staff and weight was as noted. Patient stated she was feeling tired but was in no pain. Patient denied any feelings of dizziness, shortness of breath or chest pain recently. Amy scheduled one month out visit for a follow up for Kim Mullins. I confirmed and reviewed medications filling pill box as ordered. I confirmed visit for next week around noon. Visit completed.   BP:100/68  Last week's weight: 128lbs Today's  weight: 129lbs  Future Appointments  Date Time Provider [place]  11/20/2018 11:30 AM [facility] PA/NP [facility] None  12/28/2018  7:05 AM [facility] DEVICE REMOTES [facility]     ACTION: Home visit completed Next visit planned for 1 WEEK

## 2018-10-23 NOTE — Progress Notes (Signed)
Advanced Heart Failure Clinic Note   Patient ID: Kim Mullins, female   DOB: 1930/01/02, 85 y.o.   MRN: [id number] PCP: Dr. Ayesha Rumpf Cardiology: Dr. Radford Pax HF Cardiology: Dr. Nickolas Madrid Kim Mullins is a 85 y.o. female with history of chronic systolic CHF/nonischemic cardiomyopathy, prior complete heart block, OSA, and paroxysmal atrial fibrillation presents for CHF clinic evaluation.  She was diagnosed with a cardiomyopathy back in 2015.  She had right and left heart cath in 12/15, showing no significant coronary disease.  EF was in the 30-35% range.  She was admitted in 2/16 with symptomatic bradycardia/complete heart block and had Medtronic CRT-P placed.  In 4/16, EF was back up to 50-55%.  However, repeat echo in 6/17 showed EF down to 15-20% with RV dysfunction.    She was admitted in 8/17 with suspected TIA => dysarthria and blurred vision.  She had been on Eliquis 2.5 mg bid, this was increased to 5 mg bid.  Lisinopril was stopped and Coreg was decreased due to soft BP.  Echo was done that admission, showing improvement in EF to 40-45%.    Echo in 5/19 showed EF 40%, moderate LVH, moderate diastolic dysfunction, mild to moderately decreased RV systolic function, dilated IVC.   Admitted 11/19 - 07/26/18 with worsening renal failure with Cr up to 3.7 from baseline 1.5 - 1.7. RHC demonstrated R>L failure as below. Transiently on milrinone, but weaned off prior to discharge. Cr remained elevated but stable at 2.7 on day of discharge.  Admitted to [facility] with increased shortness of breath. No reported chest pain or diaphoresis. She had been taking all medications but had felt worse. O2 sats on arrival were 89% on room air. In the ED she was given 80 mg IV lasix. CXR was negative for edema. CTA was negative for PE. Pertinent admission labs included: BNP 2006, creatinine 1.8, troponin 0.29, hgb 13.4, and WBC 3.5. Cardiology followed,patient was placed on IV Lasix, low-dose Coreg. No  ACE/ARB/spironolactone 2/2 elevated creatinine. Determined to have narrow euvolemic window. Dr. Haroldine Laws recommended added metolazone 2.5mg  once weekly (on Wednesdays) with an extra dose of KCL 40, and an additional dose of metolazone on Sundays if weight was >149lbs.   Today she returns for HF follow up. Last visit she was referred to HF paramedicine. Overall feeling ok. Having ongoing issues with constipation. Poor po intake. No appetite.  Denies PND/Orthopnea. No fever or chills. Weight at home around 125 pounds. Uses an electric cart in the grocery store. Taking all medications but did not take them today. BP per Paramedicine have been low. Lives alone. Followed by HF paramedicine.    PMH: 1. Type II diabetes: Diet-controlled.  2. GERD: s/p esophageal dilatation.  3. Chronic systolic CHF: 1st noted in 2015. Nonischemic cardiomyopathy.  - Echo (11/15) with EF 40-45%.  - LHC/RHC (12/15): Normal coronaries, EF 30-35%, mean RA 9, PA 44/16 mean 31, unable to obtain PCWP, CI 2.5.  - Developed CHB and had Medtronic CRT-P placed in 2/16. - Echo (4/16) with EF 50-55%.  - Echo (6/17): EF 15-20%, moderate LVh, restrictive diastolic function, mild MR, RV moderately dilated and moderately decreased in function, moderate TR, PASP 46 mmHg.  - Echo (8/17): EF 40-45%, diffuse hypokinesis, mild MR.  - Echo (5/19): EF 40%, moderate LVH, moderate diastolic dysfunction, mild to moderately decreased RV systolic function, dilated IVC. 4. Complete heart block: Medtronic CRT-P in 2/16.  5. OSA: Uses CPAP 6. Asthma 7. Atrial fibrillation: Paroxysmal.  8.  H/o CVA.  Possible TIA in 8/17.    - Carotid dopplers (8/17) with 1-39% BICA stenosis.   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   FH: No cardiac problems that she knows of.   Review of systems complete and found to be negative unless listed in HPI.    Current Outpatient Medications    Medication Sig Dispense Refill  . Acetaminophen (TYLENOL) 325 MG CAPS Take 650 mg by mouth daily as needed (for shoulder pain).     Marland Kitchen apixaban (ELIQUIS) 2.5 MG TABS tablet Take 1 tablet (2.5 mg total) by mouth 2 (two) times daily. 60 tablet 6  . bumetanide (BUMEX) 1 MG tablet Take 3 tablets (3 mg total) by mouth 2 (two) times daily. Take additional dose on Sunday if weight 149lbs or greater. 180 tablet 3  . carvedilol (COREG) 3.125 MG tablet Take 1 tablet (3.125 mg total) by mouth 2 (two) times daily with a meal. 60 tablet 6  . cyproheptadine (PERIACTIN) 4 MG tablet Take 1 tablet (4 mg total) by mouth at bedtime. 30 tablet 0  . diclofenac sodium (VOLTAREN) 1 % GEL Apply 2 g topically 3 (three) times daily as needed (pain, inflammation). 100 g 0  . docusate sodium (COLACE) 100 MG capsule Take 1 capsule (100 mg total) by mouth every 12 (twelve) hours. 60 capsule 0  . glucose blood test strip Use to test blood sugar twice daily (Accuchek Smartview) 100 each 0  . latanoprost (XALATAN) 0.005 % ophthalmic solution Place 1 drop into both eyes at bedtime.   11  . NON FORMULARY impax 4 mg daily at bedtime    . potassium chloride SA (K-DUR,KLOR-CON) 20 MEQ tablet Take 1 tablet (20 mEq total) by mouth 2 (two) times daily. (Patient taking differently: Take 20 mEq by mouth 3 (three) times daily. ) 60 tablet 3  . timolol (TIMOPTIC) 0.5 % ophthalmic solution Place 1 drop into both eyes daily.   11   No current facility-administered medications for this encounter.    Vitals:   10/23/18 1154  BP: 100/68  Pulse: 80  SpO2: 100%  Weight: 58.6 kg (129 lb 3.2 oz)    Wt Readings from Last 3 Encounters:  10/23/18 58.6 kg (129 lb 3.2 oz)  10/20/18 59.5 kg (131 lb 3.2 oz)  10/16/18 58.1 kg (128 lb)    Physical Exam General:  Elderly. No resp difficulty. In a wheel chair.  HEENT: normal Neck: supple. no JVD. Carotids 2+ bilat; no bruits. No lymphadenopathy or thryomegaly appreciated. Cor: PMI nondisplaced.  Regular rate & rhythm. No rubs, gallops or murmurs. Lungs: clear Abdomen: soft, nontender, nondistended. No hepatosplenomegaly. No bruits or masses. Good bowel sounds. Extremities: no cyanosis, clubbing, rash, edema Neuro: alert & orientedx3, cranial nerves grossly intact. moves all 4 extremities w/o difficulty. Affect pleasant  Assessment/Plan: 1. Chronic Diastolic/Systolic  Heart Failure-->Worsening RV Failure on recent RHC.  - NICM. ECHO 07/21/2018 EF 35-40% severe TR  -NYHA IIIb.  Due to soft BP I am going to stop carvedilol.  - Due to poor po intake bumex was cut back to 2 mg twice a day.  - No ARB/Spiro with elevated creatinine.   - 2. 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  - Process started for Tafamidis, however, will discuss with MD as to whether or not to pursue treatment given advanced age and questionable benefit. Given ongoing weight loss would hold off.  3. PAF - Regular pulse.  - Stop coreg with low BP as above - Eliquis 2.5mg  BID given her age and elevated creatinine.   4. CKD Stage IV  - Creatinine baseline 1.8-2 - I reviewed recent BMET from 2/10. Renal function was stable.   5. H/O CHB - Has medtronic CRT-P. Stable at EP visit.   6. Anorexia  7. Constipation Continue OTC. Follow up with PCP.   Follow up in 4 weeks. Referred to Palliative Care for goals of care. We discussed today and she agreed.   Greater than 50% of the (total minutes 25*) visit spent in counseling/coordination of care regarding the above. Darrick Grinder, NP  10/23/2018

## 2018-10-23 NOTE — Telephone Encounter (Signed)
-----   Message from Sarina Ill, RN sent at 10/20/2018  5:31 PM EST ----- Regarding: Sleep Hello, Dr. Radford Pax ordered a Resmed N30i mask and chin strap and a download in 4 weeks and CPAP supplies. Thanks, [employer]

## 2018-10-23 NOTE — Telephone Encounter (Signed)
Order placed today to [facility] via fax

## 2018-10-23 NOTE — Patient Instructions (Addendum)
STOP Carveidolol  DECREASE Bumex to 2 mg, twice daily  You have been referred to [facility] Phone: [phone] They will be in contact with you arrange a home visit     Your physician recommends that you schedule a follow-up appointment in: 4 weeks  in the Advanced Practitioners (PA/NP) Clinic    Do the following things EVERYDAY: 1) Weigh yourself in the morning before breakfast. Write it down and keep it in a log. 2) Take your medicines as prescribed 3) Eat low salt foods-Limit salt (sodium) to 2000 mg per day.  4) Stay as active as you can everyday 5) Limit all fluids for the day to less than 2 liters

## 2018-10-26 ENCOUNTER — Other Ambulatory Visit: Payer: Medicare HMO | Admitting: Internal Medicine

## 2018-10-26 DIAGNOSIS — Z515 Encounter for palliative care: Secondary | ICD-10-CM

## 2018-10-30 ENCOUNTER — Encounter (HOSPITAL_COMMUNITY): Payer: Self-pay

## 2018-10-30 ENCOUNTER — Other Ambulatory Visit (HOSPITAL_COMMUNITY): Payer: Self-pay

## 2018-10-30 NOTE — Progress Notes (Signed)
    [facility] Consult Note Telephone: [phone]  Fax: [phone]  PATIENT NAME: Kim Mullins DOB: 11/18/1929 MRN: [id number]  PRIMARY CARE PROVIDER:   Lin Landsman, MD  REFERRING PROVIDER:  Darrick Grinder, NP  Heart Failure Clinic  RESPONSIBLE PARTY:   Self [phone] and Lillia Corporal (grand daughter/POA) [phone]  RECOMMENDATIONS and PLAN:   Palliative Care Encounter  Z51.1  1. Fatigue:  Related to chronic heart failure (NYHA IIIb), CKD, inadequate nutritional intake.  Chronic state.  Add nutritional supplement BID in addition to heart healthy meals. Appropriate wake sleep cycles.  Weigh daily and report as recommended.  2. Constipation:  Chronic. Continue Colace.  Improve hydration and dietary fiber. Miralax qod if increase in hydration.  3.  Advanced care planning:  Introduced Palliative vs Hospice Care.  Reviewed pt's goals which are to increase energy to resume social activities.(exercise, card games), continue to manage CHF and live independently. MOST form was reviewed with pt. And grand daughter.  Selections made were attempt CPR, Full scope of treatment, antibiotics if indicated, IVs if indicated. Palliative Care will F/U with pt in aprox 4 weeks  I spent 60 minutes providing this consultation,  from 1100 to 1200 at pt's home. More than 50% of the time in this consultation was spent coordinating communication with patient and grand-daughter via phone per pt's request.   HISTORY OF PRESENT ILLNESS:  Kim Mullins is a 85 y.o. year old female with multiple medical problems including CHF(EF 35-40% in Nov 2019), pulmonary HTN, CKD.  [place] daughter(via phone) reports weight loss and increased sleeping. Pt. May drink Ensure or snack for meals.  No reports of recent falls but reports fatigue with ambulation. Pt. normally lives alonePalliative Care was asked to help address goals of care.   CODE STATUS: FULL  CODE  PPS: 50% HOSPICE ELIGIBILITY/DIAGNOSIS: TBD  PAST MEDICAL HISTORY:  Past Medical History:  Diagnosis Date  . Anemia    occassionally  . Arthritis    all over.  . Asthma    Allergixc reaction to cats.  . Atrial fibrillation ([place])   . Bowel obstruction ([place])   . Chronic systolic CHF (congestive heart failure) (HCC)    EF 30-35% by cath, echo 2016 EF 50%  . Complete heart block (HCC)    a. s/p MDT CRTP pacemaker  . DCM (dilated cardiomyopathy) ([place]) 08/16/2014   normal coronary arteries on cath with EF 30-45%.  EF now 50% by echo 11/2014  . Diabetes mellitus 2006   Diet and exercise controlled.  Marland Kitchen Dyspnea   . GERD (gastroesophageal reflux disease)    occ  . Glaucoma 08/17/2018  . Hypertension   . Left tibial fracture 2007  . OSA (obstructive sleep apnea) 10/23/2014   Moderate with AHI 21/hr  . Osteoarthritis of right shoulder region 06/26/2013  . Stroke [facility])      PHYSICAL EXAM:   General:  frail appearing, thin elderly female sitting on rollator. No NAD Cardiovascular: regular rate and rhythm Pulmonary: clear ant fields Abdomen: soft, nontender, + bowel sounds Extremities: Trace edema BLE Skin: exposed skin is intact Neurological: Weakness but otherwise nonfocal. Requires walker for ambulation  Gonzella Lex, NP

## 2018-10-30 NOTE — Progress Notes (Signed)
Paramedicine Encounter    Patient ID: Kim Mullins, female    DOB: Jan 21, 1930, 85 y.o.   MRN: [id number]   Patient Care Team: Lin Landsman, MD as PCP - General (Family Medicine) Jorge Ny, LCSW as Social Worker (Licensed Clinical Social Worker)  Patient Active Problem List   Diagnosis Date Noted  . Obesity (BMI 30-39.9) 10/20/2018  . Acute on chronic heart failure ([place]) 08/19/2018  . Goals of care, counseling/discussion   . Palliative care by specialist   . Acute respiratory failure with hypoxia ([place]) 08/18/2018  . Diabetes ([place]) 08/18/2018  . CKD (chronic kidney disease) 08/18/2018  . Amyloidosis ([place]) 08/18/2018  . Acute on chronic combined systolic and diastolic heart failure ([place]) 08/17/2018  . GERD (gastroesophageal reflux disease) 08/17/2018  . Elevated troponin 08/17/2018  . Glaucoma 08/17/2018  . Hyperbilirubinemia 08/17/2018  . CHF (congestive heart failure) ([place]) 07/18/2018  . Gait abnormality 05/03/2017  . Hypotension 04/14/2016  . Hypotension due to drugs   . Chronic anticoagulation   . TIA (transient ischemic attack) 04/12/2016  . UTI (lower urinary tract infection) 04/12/2016  . AKI (acute kidney injury) ([place]) 04/12/2016  . Atrial fibrillation ([place]) 09/12/2015  . Occipital infarction ([place])   . History of stroke 06/18/2015  . Homonymous hemianopsia   . Pacemaker 01/21/2015  . OSA (obstructive sleep apnea) 10/23/2014  . Dizziness 10/21/2014  . Complete heart block ([place]) 10/06/2014  . Benign essential HTN 10/04/2014  . Acute on chronic systolic CHF (congestive heart failure) ([place]) 10/04/2014  . Abnormal cardiovascular stress test 08/27/2014  . Pulmonary HTN ([place]) 08/16/2014  . DCM (dilated cardiomyopathy) ([place]) 08/16/2014  . Cough 07/13/2014  . Esophageal stricture 06/11/2014  . Nonspecific (abnormal) findings on radiological and other examination of gastrointestinal tract 05/29/2014  . Dyspnea 04/29/2014  . Osteoarthritis of right shoulder  region 06/26/2013  . DJD (degenerative joint disease) of hip 11/16/2011    Class: Present on Admission    Current Outpatient Medications:  .  Acetaminophen (TYLENOL) 325 MG CAPS, Take 650 mg by mouth daily as needed (for shoulder pain). , Disp: , Rfl:  .  apixaban (ELIQUIS) 2.5 MG TABS tablet, Take 1 tablet (2.5 mg total) by mouth 2 (two) times daily., Disp: 60 tablet, Rfl: 6 .  bumetanide (BUMEX) 1 MG tablet, Take 2 tablets (2 mg total) by mouth 2 (two) times daily. Take additional dose on Sunday if weight 149lbs or greater., Disp: 180 tablet, Rfl: 3 .  cyproheptadine (PERIACTIN) 4 MG tablet, Take 1 tablet (4 mg total) by mouth at bedtime., Disp: 30 tablet, Rfl: 0 .  diclofenac sodium (VOLTAREN) 1 % GEL, Apply 2 g topically 3 (three) times daily as needed (pain, inflammation)., Disp: 100 g, Rfl: 0 .  docusate sodium (COLACE) 100 MG capsule, Take 1 capsule (100 mg total) by mouth every 12 (twelve) hours., Disp: 60 capsule, Rfl: 0 .  glucose blood test strip, Use to test blood sugar twice daily ([employer]uchek Smartview), Disp: 100 each, Rfl: 0 .  latanoprost (XALATAN) 0.005 % ophthalmic solution, Place 1 drop into both eyes at bedtime. , Disp: , Rfl: 11 .  NON FORMULARY, impax 4 mg daily at bedtime, Disp: , Rfl:  .  potassium chloride SA (K-DUR,KLOR-CON) 20 MEQ tablet, Take 1 tablet (20 mEq total) by mouth 2 (two) times daily. (Patient taking differently: Take 20 mEq by mouth 3 (three) times daily. ), Disp: 60 tablet, Rfl: 3 .  timolol (TIMOPTIC) 0.5 % ophthalmic solution, Place 1 drop into both  eyes daily. , Disp: , Rfl: 11 Allergies  Allergen Reactions  . Hydrocodone Other (See Comments)    Took away all energy and made her stop eating food for short time  . Percocet [Oxycodone-Acetaminophen] Other (See Comments)    Too away all energy and made her stop eating food for short time  . Eggs Or Egg-Derived Products Hives and Rash  . Mercurial Derivatives Itching and Rash  . Penicillins Hives and  Rash    Has patient had a PCN reaction causing immediate rash, facial/tongue/throat swelling, SOB or lightheadedness with hypotension: Yes Has patient had a PCN reaction causing severe rash involving mucus membranes or skin necrosis: No Has patient had a PCN reaction that required hospitalization: No Has patient had a PCN reaction occurring within the last 10 years: Yes If all of the above answers are "NO", then may proceed with Cephalosporin use.  . Quinine Derivatives Hives and Rash     Social History   Socioeconomic History  . Marital status: Widowed    Spouse name: Not on file  . Number of children: 0  . Years of education: Not on file  . Highest education level: Doctorate  Occupational History  . Occupation: retired  Scientific laboratory technician  . Financial resource strain: Not hard at all  . Food insecurity:    Worry: Never true    Inability: Never true  . Transportation needs:    Medical: No    Non-medical: No  Tobacco Use  . Smoking status: Former Smoker    Packs/day: 1.00    Years: 45.00    Pack years: 45.00    Types: Cigarettes    Last attempt to quit: 07/24/1978    Years since quitting: 40.2  . Smokeless tobacco: Never Used  Substance and Sexual Activity  . Alcohol use: Yes    Alcohol/week: 7.0 standard drinks    Types: 7 Glasses of wine per week    Comment: every day  . Drug use: No  . Sexual activity: Not Currently  Lifestyle  . Physical activity:    Days per week: Not on file    Minutes per session: Not on file  . Stress: Not on file  Relationships  . Social connections:    Talks on phone: Not on file    Gets together: Not on file    Attends religious service: Not on file    Active member of club or organization: Not on file    Attends meetings of clubs or organizations: Not on file    Relationship status: Not on file  . Intimate partner violence:    Fear of current or ex partner: Not on file    Emotionally abused: Not on file    Physically abused: Not on  file    Forced sexual activity: Not on file  Other Topics Concern  . Not on file  Social History Narrative   Lives alone    Physical Exam Vitals signs reviewed.  Eyes:     Pupils: Pupils are equal, round, and reactive to light.  Neck:     Musculoskeletal: Normal range of motion.  Cardiovascular:     Rate and Rhythm: Normal rate.     Pulses: Normal pulses.  Pulmonary:     Effort: Pulmonary effort is normal.     Breath sounds: Normal breath sounds.  Abdominal:     General: Abdomen is flat. There is no distension.     Palpations: Abdomen is soft.  Musculoskeletal: Normal range of motion.  Right lower leg: No edema.     Left lower leg: No edema.  Skin:    General: Skin is dry.     Capillary Refill: Capillary refill takes less than 2 seconds.  Neurological:     General: No focal deficit present.     Mental Status: She is alert.  Psychiatric:        Mood and Affect: Mood normal.    Today Lauraann was alert and oriented seated on her walker upon my arrival. Berthe stated she was feeling okay today and that she woke up not too long ago and had some sausage, almond milk and toast for breakfast. Patient weight noted to be 129lbs. Patient mentioned her constipation was still present but she had not yet started the [facility] which was prescribed. Patient's medications reviewed and verified, pill box filled. Patient was educated on medications, purpose of medications and when to take them. Patient understood same. I wrote the names of the medication down along with what their taken for on paper for her to keep with her pill bottles and box. Vitals were obtained and as reviewed.  Patient stated Palliative Care came out to see her, I will be making contact with same to follow up. Home visit completed.     cbg: 172  Medications Ordered: Impax   Future Appointments  Date Time Provider [place]  11/20/2018 11:30 AM [facility] PA/NP [facility] None  12/28/2018  7:05 AM  [facility] DEVICE REMOTES [facility]     ACTION: Home visit completed Next visit planned for 3/9 @ 12pm

## 2018-10-31 DIAGNOSIS — R69 Illness, unspecified: Secondary | ICD-10-CM | POA: Diagnosis not present

## 2018-10-31 DIAGNOSIS — I10 Essential (primary) hypertension: Secondary | ICD-10-CM | POA: Diagnosis not present

## 2018-10-31 DIAGNOSIS — I951 Orthostatic hypotension: Secondary | ICD-10-CM | POA: Diagnosis not present

## 2018-10-31 DIAGNOSIS — H409 Unspecified glaucoma: Secondary | ICD-10-CM | POA: Diagnosis not present

## 2018-10-31 DIAGNOSIS — Z833 Family history of diabetes mellitus: Secondary | ICD-10-CM | POA: Diagnosis not present

## 2018-10-31 DIAGNOSIS — M25519 Pain in unspecified shoulder: Secondary | ICD-10-CM | POA: Diagnosis not present

## 2018-10-31 DIAGNOSIS — K5900 Constipation, unspecified: Secondary | ICD-10-CM | POA: Diagnosis not present

## 2018-10-31 DIAGNOSIS — Z7901 Long term (current) use of anticoagulants: Secondary | ICD-10-CM | POA: Diagnosis not present

## 2018-10-31 DIAGNOSIS — L299 Pruritus, unspecified: Secondary | ICD-10-CM | POA: Diagnosis not present

## 2018-10-31 DIAGNOSIS — R269 Unspecified abnormalities of gait and mobility: Secondary | ICD-10-CM | POA: Diagnosis not present

## 2018-11-06 ENCOUNTER — Other Ambulatory Visit (HOSPITAL_COMMUNITY): Payer: Self-pay

## 2018-11-06 NOTE — Progress Notes (Signed)
Paramedicine Encounter    Patient ID: Kim Mullins, female    DOB: 05/13/1930, 85 y.o.   MRN: [id number]   Patient Care Team: Lin Landsman, MD as PCP - General (Family Medicine) Jorge Ny, LCSW as Social Worker (Licensed Clinical Social Worker)  Patient Active Problem List   Diagnosis Date Noted  . Obesity (BMI 30-39.9) 10/20/2018  . Acute on chronic heart failure ([place]) 08/19/2018  . Goals of care, counseling/discussion   . Palliative care by specialist   . Acute respiratory failure with hypoxia ([place]) 08/18/2018  . Diabetes ([place]) 08/18/2018  . CKD (chronic kidney disease) 08/18/2018  . Amyloidosis ([place]) 08/18/2018  . Acute on chronic combined systolic and diastolic heart failure ([place]) 08/17/2018  . GERD (gastroesophageal reflux disease) 08/17/2018  . Elevated troponin 08/17/2018  . Glaucoma 08/17/2018  . Hyperbilirubinemia 08/17/2018  . CHF (congestive heart failure) ([place]) 07/18/2018  . Gait abnormality 05/03/2017  . Hypotension 04/14/2016  . Hypotension due to drugs   . Chronic anticoagulation   . TIA (transient ischemic attack) 04/12/2016  . UTI (lower urinary tract infection) 04/12/2016  . AKI (acute kidney injury) ([place]) 04/12/2016  . Atrial fibrillation ([place]) 09/12/2015  . Occipital infarction ([place])   . History of stroke 06/18/2015  . Homonymous hemianopsia   . Pacemaker 01/21/2015  . OSA (obstructive sleep apnea) 10/23/2014  . Dizziness 10/21/2014  . Complete heart block ([place]) 10/06/2014  . Benign essential HTN 10/04/2014  . Acute on chronic systolic CHF (congestive heart failure) ([place]) 10/04/2014  . Abnormal cardiovascular stress test 08/27/2014  . Pulmonary HTN ([place]) 08/16/2014  . DCM (dilated cardiomyopathy) ([place]) 08/16/2014  . Cough 07/13/2014  . Esophageal stricture 06/11/2014  . Nonspecific (abnormal) findings on radiological and other examination of gastrointestinal tract 05/29/2014  . Dyspnea 04/29/2014  . Osteoarthritis of right shoulder  region 06/26/2013  . DJD (degenerative joint disease) of hip 11/16/2011    Class: Present on Admission    Current Outpatient Medications:  .  Acetaminophen (TYLENOL) 325 MG CAPS, Take 650 mg by mouth daily as needed (for shoulder pain). , Disp: , Rfl:  .  apixaban (ELIQUIS) 2.5 MG TABS tablet, Take 1 tablet (2.5 mg total) by mouth 2 (two) times daily., Disp: 60 tablet, Rfl: 6 .  bumetanide (BUMEX) 1 MG tablet, Take 2 tablets (2 mg total) by mouth 2 (two) times daily. Take additional dose on Sunday if weight 149lbs or greater., Disp: 180 tablet, Rfl: 3 .  diclofenac sodium (VOLTAREN) 1 % GEL, Apply 2 g topically 3 (three) times daily as needed (pain, inflammation)., Disp: 100 g, Rfl: 0 .  docusate sodium (COLACE) 100 MG capsule, Take 1 capsule (100 mg total) by mouth every 12 (twelve) hours., Disp: 60 capsule, Rfl: 0 .  glucose blood test strip, Use to test blood sugar twice daily (Accuchek Smartview), Disp: 100 each, Rfl: 0 .  latanoprost (XALATAN) 0.005 % ophthalmic solution, Place 1 drop into both eyes at bedtime. , Disp: , Rfl: 11 .  potassium chloride SA (K-DUR,KLOR-CON) 20 MEQ tablet, Take 1 tablet (20 mEq total) by mouth 2 (two) times daily. (Patient taking differently: Take 20 mEq by mouth 3 (three) times daily. ), Disp: 60 tablet, Rfl: 3 .  timolol (TIMOPTIC) 0.5 % ophthalmic solution, Place 1 drop into both eyes daily. , Disp: , Rfl: 11 .  cyproheptadine (PERIACTIN) 4 MG tablet, Take 1 tablet (4 mg total) by mouth at bedtime. (Patient not taking: Reported on 10/30/2018), Disp: 30 tablet, Rfl: 0 .  NON FORMULARY, impax 4 mg daily at bedtime, Disp: , Rfl:  Allergies  Allergen Reactions  . Hydrocodone Other (See Comments)    Took away all energy and made her stop eating food for short time  . Percocet [Oxycodone-Acetaminophen] Other (See Comments)    Too away all energy and made her stop eating food for short time  . Eggs Or Egg-Derived Products Hives and Rash  . Mercurial Derivatives  Itching and Rash  . Penicillins Hives and Rash    Has patient had a PCN reaction causing immediate rash, facial/tongue/throat swelling, SOB or lightheadedness with hypotension: Yes Has patient had a PCN reaction causing severe rash involving mucus membranes or skin necrosis: No Has patient had a PCN reaction that required hospitalization: No Has patient had a PCN reaction occurring within the last 10 years: Yes If all of the above answers are "NO", then may proceed with Cephalosporin use.  . Quinine Derivatives Hives and Rash      Social History   Socioeconomic History  . Marital status: Widowed    Spouse name: Not on file  . Number of children: 0  . Years of education: Not on file  . Highest education level: Doctorate  Occupational History  . Occupation: retired  Scientific laboratory technician  . Financial resource strain: Not hard at all  . Food insecurity:    Worry: Never true    Inability: Never true  . Transportation needs:    Medical: No    Non-medical: No  Tobacco Use  . Smoking status: Former Smoker    Packs/day: 1.00    Years: 45.00    Pack years: 45.00    Types: Cigarettes    Last attempt to quit: 07/24/1978    Years since quitting: 40.3  . Smokeless tobacco: Never Used  Substance and Sexual Activity  . Alcohol use: Yes    Alcohol/week: 7.0 standard drinks    Types: 7 Glasses of wine per week    Comment: every day  . Drug use: No  . Sexual activity: Not Currently  Lifestyle  . Physical activity:    Days per week: Not on file    Minutes per session: Not on file  . Stress: Not on file  Relationships  . Social connections:    Talks on phone: Not on file    Gets together: Not on file    Attends religious service: Not on file    Active member of club or organization: Not on file    Attends meetings of clubs or organizations: Not on file    Relationship status: Not on file  . Intimate partner violence:    Fear of current or ex partner: Not on file    Emotionally abused:  Not on file    Physically abused: Not on file    Forced sexual activity: Not on file  Other Topics Concern  . Not on file  Social History Narrative   Lives alone    Physical Exam      Future Appointments  Date Time Provider [place]  11/20/2018 11:30 AM [facility] PA/NP [facility] None  12/28/2018  7:05 AM [facility] DEVICE REMOTES [facility]    BP 98/78   Pulse 76   Resp 15   Wt 127 lb (57.6 kg)   SpO2 94%   BMI 22.50 kg/m   Weight yesterday-? Last visit weight-129 CBG EMS-178  Pt reports feeling ok, she has missed multiple doses of her meds this week. She reports she gets tired  sometimes and just doesn't get out of bed but tries to take at least one dose of her meds. It seems she has trouble taking meds multiple times a day.  She had trazadone in her pill box but she denies knowing what that is, she hasnt taken it this week-denies issues sleeping and its not listed in Epic. So that was removed and placed in a baggie in her med bag.  I placed her meds for twice a day--morn and bedtime and advised her to keep it by her bed since she doesn't always get up much and doesn't take all her doses.  meds verified and pill bxo refilled.    Marylouise Stacks, [facility] Paramedic  11/06/18

## 2018-11-13 ENCOUNTER — Telehealth: Payer: Self-pay

## 2018-11-13 ENCOUNTER — Other Ambulatory Visit (HOSPITAL_COMMUNITY): Payer: Self-pay

## 2018-11-13 NOTE — Progress Notes (Signed)
Today I went by to see [name]. She called ahead of time to request that I only come by to do her pill box and to not come inside of the home to do so due to the virus and her taking extra precautions. I told [name] to place her pill box out on the porch with her medications and that I would fill her pill box outside. On arrival items were placed on the porch, I filled the pill box appropriately. Patient talked to me through the storm door of her home. Patient denied any pain, stated she is "feeling good". Patient denied pain or shortness of breath. She did mention she has continued to have some problems with constipation but has had an increase in her appetite lately and has passed some bowel movements but still not normal. Patient did not want me to assess her vitals today. Patient did weigh and checked her own CBG to report to me. Next visit planned for 3/23 at 12.   CBG- 111 Weight- 125lbs

## 2018-11-13 NOTE — Telephone Encounter (Signed)
Kim Mullins called me in regards to today's visit, she states she does not want me coming to her house in light of the "virus". Patient was reassured I would come in appropriate PPE so I could come fill her pill box and check her vital signs. Patient continued to refuse and stated she didn't want me there. I am going to call back today to advise her it would be in her best interest to allow me to properly fill her medications in her pill box.

## 2018-11-15 ENCOUNTER — Telehealth (HOSPITAL_COMMUNITY): Payer: Self-pay

## 2018-11-15 NOTE — Telephone Encounter (Signed)
Triage Appointment Phone Calls  NOTE : At this time, do not cancel POST HOSP Appointments.   Introduction: We are calling today to discuss your upcoming appointment. As you know we are dealing with the Coronavirus COVID-19 and want to take all precautions necessary to protect our patients and staff.   Questions 1. Have you or any one in your house had a fever in the past 2 weeks? No  2. How are you feeling/doing?  a. Is your weight stable? yes BP stable if checking at home?  Yes. Pt checks at home.  Is their breathing at baseline? no     -> If asymptomatic, #3. 3. Do you have enough of your cardiac medications to last you for the next 2 months, or do you need refills? yes 4. Do you have enough food and supplies to last for at least 2 weeks? yes    5. Reschedule their appointment with the following recommendations: 01/01/2019 1:30p               -> We will remain open, so please call right away if they are developing any worsening symptoms   -> Their appointment may be out 2 months, but depending on the course of the virus, we may open up appointments sooner   -> Encouraged them to sign up for my-chart and let them know we are looking at the possibility of virtual or telephone appointments  Spoke to pt. Pt denies symptoms of worsening heart failure. She denies sob, or worse swelling. She denies the need for any refills at this time. She says she is fine on groceries as she just went to the store. Advise pt to call if she experiences increased shortness of breath or weight gain.

## 2018-11-17 ENCOUNTER — Telehealth (HOSPITAL_COMMUNITY): Payer: Self-pay | Admitting: Licensed Clinical Social Worker

## 2018-11-17 NOTE — Telephone Encounter (Signed)
Patient identified as a candidate to receive 14 heart healthy meals per week for 3 months through [facility] partnership with [employer] program.   Completed referral sent in for review.  Anticipate patient will receive first shipment of food in 1-3 business days.  Tamiya Colello H. Ameir Faria, LCSW Clinical Social Worker [facility] Desk#: [phone] Cell#: [phone]   

## 2018-11-20 ENCOUNTER — Other Ambulatory Visit (HOSPITAL_COMMUNITY): Payer: Self-pay

## 2018-11-20 ENCOUNTER — Telehealth: Payer: Self-pay

## 2018-11-20 ENCOUNTER — Encounter (HOSPITAL_COMMUNITY): Payer: Medicare HMO

## 2018-11-20 NOTE — Telephone Encounter (Signed)
Spoke with Kim Mullins who states she is feeling good today. She reports no fever or cough with no other symptoms at present. Patient confirmed visit for medication pick up, review and pill box fill today.

## 2018-11-20 NOTE — Progress Notes (Signed)
Paramedicine Encounter    Patient ID: Kim Mullins, female    DOB: 01-03-1930, 85 y.o.   MRN: [id number]   Patient Care Team: Lin Landsman, MD as PCP - General (Family Medicine) Jorge Ny, LCSW as Social Worker (Licensed Clinical Social Worker)  Patient Active Problem List   Diagnosis Date Noted  . Obesity (BMI 30-39.9) 10/20/2018  . Acute on chronic heart failure ([place]) 08/19/2018  . Palliative care encounter   . Palliative care by specialist   . Acute respiratory failure with hypoxia ([place]) 08/18/2018  . Diabetes ([place]) 08/18/2018  . CKD (chronic kidney disease) 08/18/2018  . Amyloidosis ([place]) 08/18/2018  . Acute on chronic combined systolic and diastolic heart failure ([place]) 08/17/2018  . GERD (gastroesophageal reflux disease) 08/17/2018  . Elevated troponin 08/17/2018  . Glaucoma 08/17/2018  . Hyperbilirubinemia 08/17/2018  . CHF (congestive heart failure) ([place]) 07/18/2018  . Gait abnormality 05/03/2017  . Hypotension 04/14/2016  . Hypotension due to drugs   . Chronic anticoagulation   . TIA (transient ischemic attack) 04/12/2016  . UTI (lower urinary tract infection) 04/12/2016  . AKI (acute kidney injury) ([place]) 04/12/2016  . Atrial fibrillation ([place]) 09/12/2015  . Occipital infarction ([place])   . History of stroke 06/18/2015  . Homonymous hemianopsia   . Pacemaker 01/21/2015  . OSA (obstructive sleep apnea) 10/23/2014  . Dizziness 10/21/2014  . Complete heart block ([place]) 10/06/2014  . Benign essential HTN 10/04/2014  . Acute on chronic systolic CHF (congestive heart failure) ([place]) 10/04/2014  . Abnormal cardiovascular stress test 08/27/2014  . Pulmonary HTN ([place]) 08/16/2014  . DCM (dilated cardiomyopathy) ([place]) 08/16/2014  . Cough 07/13/2014  . Esophageal stricture 06/11/2014  . Nonspecific (abnormal) findings on radiological and other examination of gastrointestinal tract 05/29/2014  . Dyspnea 04/29/2014  . Osteoarthritis of right shoulder region  06/26/2013  . DJD (degenerative joint disease) of hip 11/16/2011    Class: Present on Admission    Current Outpatient Medications:  .  Acetaminophen (TYLENOL) 325 MG CAPS, Take 650 mg by mouth daily as needed (for shoulder pain). , Disp: , Rfl:  .  apixaban (ELIQUIS) 2.5 MG TABS tablet, Take 1 tablet (2.5 mg total) by mouth 2 (two) times daily., Disp: 60 tablet, Rfl: 6 .  bumetanide (BUMEX) 1 MG tablet, Take 2 tablets (2 mg total) by mouth 2 (two) times daily. Take additional dose on Sunday if weight 149lbs or greater., Disp: 180 tablet, Rfl: 3 .  diclofenac sodium (VOLTAREN) 1 % GEL, Apply 2 g topically 3 (three) times daily as needed (pain, inflammation)., Disp: 100 g, Rfl: 0 .  docusate sodium (COLACE) 100 MG capsule, Take 1 capsule (100 mg total) by mouth every 12 (twelve) hours., Disp: 60 capsule, Rfl: 0 .  glucose blood test strip, Use to test blood sugar twice daily (Accuchek Smartview), Disp: 100 each, Rfl: 0 .  latanoprost (XALATAN) 0.005 % ophthalmic solution, Place 1 drop into both eyes at bedtime. , Disp: , Rfl: 11 .  potassium chloride SA (K-DUR,KLOR-CON) 20 MEQ tablet, Take 1 tablet (20 mEq total) by mouth 2 (two) times daily. (Patient taking differently: Take 20 mEq by mouth 3 (three) times daily. ), Disp: 60 tablet, Rfl: 3 .  timolol (TIMOPTIC) 0.5 % ophthalmic solution, Place 1 drop into both eyes daily. , Disp: , Rfl: 11 .  cyproheptadine (PERIACTIN) 4 MG tablet, Take 1 tablet (4 mg total) by mouth at bedtime. (Patient not taking: Reported on 10/30/2018), Disp: 30 tablet, Rfl: 0 .  NON FORMULARY, impax 4 mg daily at bedtime, Disp: , Rfl:  Allergies  Allergen Reactions  . Hydrocodone Other (See Comments)    Took away all energy and made her stop eating food for short time  . Percocet [Oxycodone-Acetaminophen] Other (See Comments)    Too away all energy and made her stop eating food for short time  . Eggs Or Egg-Derived Products Hives and Rash  . Mercurial Derivatives Itching  and Rash  . Penicillins Hives and Rash    Has patient had a PCN reaction causing immediate rash, facial/tongue/throat swelling, SOB or lightheadedness with hypotension: Yes Has patient had a PCN reaction causing severe rash involving mucus membranes or skin necrosis: No Has patient had a PCN reaction that required hospitalization: No Has patient had a PCN reaction occurring within the last 10 years: Yes If all of the above answers are "NO", then may proceed with Cephalosporin use.  . Quinine Derivatives Hives and Rash     Social History   Socioeconomic History  . Marital status: Widowed    Spouse name: Not on file  . Number of children: 0  . Years of education: Not on file  . Highest education level: Doctorate  Occupational History  . Occupation: retired  Scientific laboratory technician  . Financial resource strain: Not hard at all  . Food insecurity:    Worry: Never true    Inability: Never true  . Transportation needs:    Medical: No    Non-medical: No  Tobacco Use  . Smoking status: Former Smoker    Packs/day: 1.00    Years: 45.00    Pack years: 45.00    Types: Cigarettes    Last attempt to quit: 07/24/1978    Years since quitting: 40.3  . Smokeless tobacco: Never Used  Substance and Sexual Activity  . Alcohol use: Yes    Alcohol/week: 7.0 standard drinks    Types: 7 Glasses of wine per week    Comment: every day  . Drug use: No  . Sexual activity: Not Currently  Lifestyle  . Physical activity:    Days per week: Not on file    Minutes per session: Not on file  . Stress: Not on file  Relationships  . Social connections:    Talks on phone: Not on file    Gets together: Not on file    Attends religious service: Not on file    Active member of club or organization: Not on file    Attends meetings of clubs or organizations: Not on file    Relationship status: Not on file  . Intimate partner violence:    Fear of current or ex partner: Not on file    Emotionally abused: Not on  file    Physically abused: Not on file    Forced sexual activity: Not on file  Other Topics Concern  . Not on file  Social History Narrative   Lives alone    Physical Exam Constitutional:      Appearance: Normal appearance.  HENT:     Nose: No congestion.  Eyes:     Pupils: Pupils are equal, round, and reactive to light.  Neurological:     Mental Status: She is alert. Mental status is at baseline.  Psychiatric:        Mood and Affect: Mood normal.     Visited with Mrs. Sylvan today who allowed be to come by to fill her pill box and talk through her front door about  how she was feeling. Patient asked me to pick up her medications for her at [facility]. I picked up medications for patient and went back to her home filling her pill box and reviewing medications. Patient has enough medications however, Eliquis will only last her until next week but I called in the prescription to [facility] and they stated they would fill the medication and I could pick up next Monday 3/30. Patient complained of "itching" all over and she stated this was because of the [facility] trees in her neighborhood, she reports this happening every Spring. Patient assessed her vitals in front of me and I documented same. Patient reports normal breathing, no shortness of breath or pain. Patient had no obvious edema. Patient stated she is having better bowel movements and is continuing to use Mag. Citrate and prescribed medicines for same. Patient was reminded her Mom's Meals would be delivered this week. Patient signed Eliquis patient assistance form and I will be dropping this off to the clinic today. Visit completed. Patient reminded to call me if any issues arise. Next visit planned for 3/30.   CBG: 128 WEIGHT: 123lbs LAST WEIGHT: 125lbs   Future Appointments  Date Time Provider [place]  12/28/2018  7:05 AM [facility] DEVICE REMOTES [facility]  01/01/2019  1:30 PM [facility] PA/NP [facility] None      ACTION: Home visit completed Next visit planned for 3/30

## 2018-11-21 ENCOUNTER — Other Ambulatory Visit (HOSPITAL_COMMUNITY): Payer: Self-pay | Admitting: Cardiology

## 2018-11-21 MED ORDER — APIXABAN 2.5 MG PO TABS: 2.5000 mg | ORAL_TABLET | Freq: Two times a day (BID) | ORAL | 11 refills | Status: DC

## 2018-11-21 NOTE — Telephone Encounter (Signed)
rx printed for PAP application

## 2018-11-23 ENCOUNTER — Other Ambulatory Visit: Payer: Medicare HMO | Admitting: Internal Medicine

## 2018-11-23 ENCOUNTER — Other Ambulatory Visit: Payer: Self-pay

## 2018-11-24 ENCOUNTER — Other Ambulatory Visit: Payer: Self-pay

## 2018-11-27 ENCOUNTER — Other Ambulatory Visit (HOSPITAL_COMMUNITY): Payer: Self-pay

## 2018-11-27 ENCOUNTER — Telehealth (HOSPITAL_COMMUNITY): Payer: Self-pay

## 2018-11-27 NOTE — Progress Notes (Signed)
Paramedicine Encounter    Patient ID: Kim Mullins, female    DOB: [birth year], 85 y.o.   MRN: [id number]   Patient Care Team: Lin Landsman, MD as PCP - General (Family Medicine) Larey Dresser, MD as PCP - Advanced Heart Failure (Cardiology) Jorge Ny, LCSW as Social Worker (Licensed Clinical Social Worker)  Patient Active Problem List   Diagnosis Date Noted  . Obesity (BMI 30-39.9) 10/20/2018  . Acute on chronic heart failure ([place]) 08/19/2018  . Palliative care encounter   . Palliative care by specialist   . Acute respiratory failure with hypoxia ([place]) 08/18/2018  . Diabetes ([place]) 08/18/2018  . CKD (chronic kidney disease) 08/18/2018  . Amyloidosis ([place]) 08/18/2018  . Acute on chronic combined systolic and diastolic heart failure ([place]) 08/17/2018  . GERD (gastroesophageal reflux disease) 08/17/2018  . Elevated troponin 08/17/2018  . Glaucoma 08/17/2018  . Hyperbilirubinemia 08/17/2018  . CHF (congestive heart failure) ([place]) 07/18/2018  . Gait abnormality 05/03/2017  . Hypotension 04/14/2016  . Hypotension due to drugs   . Chronic anticoagulation   . TIA (transient ischemic attack) 04/12/2016  . UTI (lower urinary tract infection) 04/12/2016  . AKI (acute kidney injury) ([place]) 04/12/2016  . Atrial fibrillation ([place]) 09/12/2015  . Occipital infarction ([place])   . History of stroke 06/18/2015  . Homonymous hemianopsia   . Pacemaker 01/21/2015  . OSA (obstructive sleep apnea) 10/23/2014  . Dizziness 10/21/2014  . Complete heart block ([place]) 10/06/2014  . Benign essential HTN 10/04/2014  . Acute on chronic systolic CHF (congestive heart failure) ([place]) 10/04/2014  . Abnormal cardiovascular stress test 08/27/2014  . Pulmonary HTN ([place]) 08/16/2014  . DCM (dilated cardiomyopathy) ([place]) 08/16/2014  . Cough 07/13/2014  . Esophageal stricture 06/11/2014  . Nonspecific (abnormal) findings on radiological and other examination of gastrointestinal tract 05/29/2014  .  Dyspnea 04/29/2014  . Osteoarthritis of right shoulder region 06/26/2013  . DJD (degenerative joint disease) of hip 11/16/2011    Class: Present on Admission    Current Outpatient Medications:  .  apixaban (ELIQUIS) 2.5 MG TABS tablet, Take 1 tablet (2.5 mg total) by mouth 2 (two) times daily., Disp: 60 tablet, Rfl: 11 .  bumetanide (BUMEX) 1 MG tablet, Take 2 tablets (2 mg total) by mouth 2 (two) times daily. Take additional dose on Sunday if weight 149lbs or greater., Disp: 180 tablet, Rfl: 3 .  diclofenac sodium (VOLTAREN) 1 % GEL, Apply 2 g topically 3 (three) times daily as needed (pain, inflammation)., Disp: 100 g, Rfl: 0 .  docusate sodium (COLACE) 100 MG capsule, Take 1 capsule (100 mg total) by mouth every 12 (twelve) hours., Disp: 60 capsule, Rfl: 0 .  glucose blood test strip, Use to test blood sugar twice daily (Accuchek Smartview), Disp: 100 each, Rfl: 0 .  latanoprost (XALATAN) 0.005 % ophthalmic solution, Place 1 drop into both eyes at bedtime. , Disp: , Rfl: 11 .  potassium chloride SA (K-DUR,KLOR-CON) 20 MEQ tablet, Take 1 tablet (20 mEq total) by mouth 2 (two) times daily. (Patient taking differently: Take 20 mEq by mouth 3 (three) times daily. ), Disp: 60 tablet, Rfl: 3 .  timolol (TIMOPTIC) 0.5 % ophthalmic solution, Place 1 drop into both eyes daily. , Disp: , Rfl: 11 .  Acetaminophen (TYLENOL) 325 MG CAPS, Take 650 mg by mouth daily as needed (for shoulder pain). , Disp: , Rfl:  .  cyproheptadine (PERIACTIN) 4 MG tablet, Take 1 tablet (4 mg total) by mouth at bedtime. (Patient not  taking: Reported on 10/30/2018), Disp: 30 tablet, Rfl: 0 .  NON FORMULARY, impax 4 mg daily at bedtime, Disp: , Rfl:  Allergies  Allergen Reactions  . Hydrocodone Other (See Comments)    Took away all energy and made her stop eating food for short time  . Percocet [Oxycodone-Acetaminophen] Other (See Comments)    Too away all energy and made her stop eating food for short time  . Eggs Or  Egg-Derived Products Hives and Rash  . Mercurial Derivatives Itching and Rash  . Penicillins Hives and Rash    Has patient had a PCN reaction causing immediate rash, facial/tongue/throat swelling, SOB or lightheadedness with hypotension: Yes Has patient had a PCN reaction causing severe rash involving mucus membranes or skin necrosis: No Has patient had a PCN reaction that required hospitalization: No Has patient had a PCN reaction occurring within the last 10 years: Yes If all of the above answers are "NO", then may proceed with Cephalosporin use.  . Quinine Derivatives Hives and Rash     Social History   Socioeconomic History  . Marital status: Widowed    Spouse name: Not on file  . Number of children: 0  . Years of education: Not on file  . Highest education level: Doctorate  Occupational History  . Occupation: retired  Scientific laboratory technician  . Financial resource strain: Not hard at all  . Food insecurity:    Worry: Never true    Inability: Never true  . Transportation needs:    Medical: No    Non-medical: No  Tobacco Use  . Smoking status: Former Smoker    Packs/day: 1.00    Years: 45.00    Pack years: 45.00    Types: Cigarettes    Last attempt to quit: 07/24/1978    Years since quitting: 40.3  . Smokeless tobacco: Never Used  Substance and Sexual Activity  . Alcohol use: Yes    Alcohol/week: 7.0 standard drinks    Types: 7 Glasses of wine per week    Comment: every day  . Drug use: No  . Sexual activity: Not Currently  Lifestyle  . Physical activity:    Days per week: Not on file    Minutes per session: Not on file  . Stress: Not on file  Relationships  . Social connections:    Talks on phone: Not on file    Gets together: Not on file    Attends religious service: Not on file    Active member of club or organization: Not on file    Attends meetings of clubs or organizations: Not on file    Relationship status: Not on file  . Intimate partner violence:    Fear  of current or ex partner: Not on file    Emotionally abused: Not on file    Physically abused: Not on file    Forced sexual activity: Not on file  Other Topics Concern  . Not on file  Social History Narrative   Lives alone    Physical Exam Vitals signs reviewed.  Pulmonary:     Effort: Pulmonary effort is normal. No respiratory distress.  Abdominal:     General: Abdomen is flat.     Palpations: Abdomen is soft.  Musculoskeletal:     Right lower leg: No edema.     Left lower leg: No edema.  Skin:    General: Skin is warm and dry.     Capillary Refill: Capillary refill takes less than 2 seconds.  Neurological:     Mental Status: She is alert. Mental status is at baseline.      Visit with Bich today was limited to medication pick up, review and pill box and a modified assessment was completed. Patient received her Mom's Meals delivery. Patient stated that she is only eating half of the meal due to lack of appetite. Patient continues to have problems with her bowel movements but states they have gotten better in that she is passing a soft bowel movement every other day. Patient reports only taking the stool softener when needed. Patient denied any pain and stated she is feeling fine other than a lack of appetite which seems to be an ongoing issue. Patient took her own blood sugar and reported it to be 179. Patient stated she would call me if she began to have any issues. Visit complete.     WEIGHT THIS VISIT: 120LBS  WEIGHT LAST VISIT: 123LBS   CBG: 179 (AFTER EATING)   Future Appointments  Date Time Provider [address]  12/28/2018  7:05 AM [facility] DEVICE REMOTES [facility]  01/01/2019  1:30 PM [facility] PA/NP [facility] None     ACTION: Home visit completed Next visit planned for ONE WEEK OUT

## 2018-11-27 NOTE — Telephone Encounter (Signed)
[place], but had no answer. Voicemail box is full. Awaiting call back for appointment confirmation today.

## 2018-11-29 DIAGNOSIS — G4733 Obstructive sleep apnea (adult) (pediatric): Secondary | ICD-10-CM | POA: Diagnosis not present

## 2018-12-04 ENCOUNTER — Other Ambulatory Visit (HOSPITAL_COMMUNITY): Payer: Self-pay

## 2018-12-04 NOTE — Progress Notes (Signed)
Paramedicine Encounter    Patient ID: Kim Mullins, female    DOB: 01-30-1930, 85 y.o.   MRN: [id number]   Patient Care Team: Lin Landsman, MD as PCP - General (Family Medicine) Larey Dresser, MD as PCP - Advanced Heart Failure (Cardiology) Jorge Ny, LCSW as Social Worker (Licensed Clinical Social Worker)  Patient Active Problem List   Diagnosis Date Noted  . Obesity (BMI 30-39.9) 10/20/2018  . Acute on chronic heart failure ([place]) 08/19/2018  . Palliative care encounter   . Palliative care by specialist   . Acute respiratory failure with hypoxia ([place]) 08/18/2018  . Diabetes ([place]) 08/18/2018  . CKD (chronic kidney disease) 08/18/2018  . Amyloidosis ([place]) 08/18/2018  . Acute on chronic combined systolic and diastolic heart failure ([place]) 08/17/2018  . GERD (gastroesophageal reflux disease) 08/17/2018  . Elevated troponin 08/17/2018  . Glaucoma 08/17/2018  . Hyperbilirubinemia 08/17/2018  . CHF (congestive heart failure) ([place]) 07/18/2018  . Gait abnormality 05/03/2017  . Hypotension 04/14/2016  . Hypotension due to drugs   . Chronic anticoagulation   . TIA (transient ischemic attack) 04/12/2016  . UTI (lower urinary tract infection) 04/12/2016  . AKI (acute kidney injury) ([place]) 04/12/2016  . Atrial fibrillation ([place]) 09/12/2015  . Occipital infarction ([place])   . History of stroke 06/18/2015  . Homonymous hemianopsia   . Pacemaker 01/21/2015  . OSA (obstructive sleep apnea) 10/23/2014  . Dizziness 10/21/2014  . Complete heart block ([place]) 10/06/2014  . Benign essential HTN 10/04/2014  . Acute on chronic systolic CHF (congestive heart failure) ([place]) 10/04/2014  . Abnormal cardiovascular stress test 08/27/2014  . Pulmonary HTN ([place]) 08/16/2014  . DCM (dilated cardiomyopathy) ([place]) 08/16/2014  . Cough 07/13/2014  . Esophageal stricture 06/11/2014  . Nonspecific (abnormal) findings on radiological and other examination of gastrointestinal tract 05/29/2014  .  Dyspnea 04/29/2014  . Osteoarthritis of right shoulder region 06/26/2013  . DJD (degenerative joint disease) of hip 11/16/2011    Class: Present on Admission    Current Outpatient Medications:  .  Acetaminophen (TYLENOL) 325 MG CAPS, Take 650 mg by mouth daily as needed (for shoulder pain). , Disp: , Rfl:  .  apixaban (ELIQUIS) 2.5 MG TABS tablet, Take 1 tablet (2.5 mg total) by mouth 2 (two) times daily., Disp: 60 tablet, Rfl: 11 .  bumetanide (BUMEX) 1 MG tablet, Take 2 tablets (2 mg total) by mouth 2 (two) times daily. Take additional dose on Sunday if weight 149lbs or greater., Disp: 180 tablet, Rfl: 3 .  cyproheptadine (PERIACTIN) 4 MG tablet, Take 1 tablet (4 mg total) by mouth at bedtime. (Patient not taking: Reported on 10/30/2018), Disp: 30 tablet, Rfl: 0 .  diclofenac sodium (VOLTAREN) 1 % GEL, Apply 2 g topically 3 (three) times daily as needed (pain, inflammation)., Disp: 100 g, Rfl: 0 .  docusate sodium (COLACE) 100 MG capsule, Take 1 capsule (100 mg total) by mouth every 12 (twelve) hours., Disp: 60 capsule, Rfl: 0 .  glucose blood test strip, Use to test blood sugar twice daily (Accuchek Smartview), Disp: 100 each, Rfl: 0 .  latanoprost (XALATAN) 0.005 % ophthalmic solution, Place 1 drop into both eyes at bedtime. , Disp: , Rfl: 11 .  NON FORMULARY, impax 4 mg daily at bedtime, Disp: , Rfl:  .  potassium chloride SA (K-DUR,KLOR-CON) 20 MEQ tablet, Take 1 tablet (20 mEq total) by mouth 2 (two) times daily. (Patient taking differently: Take 20 mEq by mouth 3 (three) times daily. ), Disp: 60  tablet, Rfl: 3 .  timolol (TIMOPTIC) 0.5 % ophthalmic solution, Place 1 drop into both eyes daily. , Disp: , Rfl: 11 Allergies  Allergen Reactions  . Hydrocodone Other (See Comments)    Took away all energy and made her stop eating food for short time  . Percocet [Oxycodone-Acetaminophen] Other (See Comments)    Too away all energy and made her stop eating food for short time  . Eggs Or  Egg-Derived Products Hives and Rash  . Mercurial Derivatives Itching and Rash  . Penicillins Hives and Rash    Has patient had a PCN reaction causing immediate rash, facial/tongue/throat swelling, SOB or lightheadedness with hypotension: Yes Has patient had a PCN reaction causing severe rash involving mucus membranes or skin necrosis: No Has patient had a PCN reaction that required hospitalization: No Has patient had a PCN reaction occurring within the last 10 years: Yes If all of the above answers are "NO", then may proceed with Cephalosporin use.  . Quinine Derivatives Hives and Rash     Social History   Socioeconomic History  . Marital status: Widowed    Spouse name: Not on file  . Number of children: 0  . Years of education: Not on file  . Highest education level: Doctorate  Occupational History  . Occupation: retired  Scientific laboratory technician  . Financial resource strain: Not hard at all  . Food insecurity:    Worry: Never true    Inability: Never true  . Transportation needs:    Medical: No    Non-medical: No  Tobacco Use  . Smoking status: Former Smoker    Packs/day: 1.00    Years: 45.00    Pack years: 45.00    Types: Cigarettes    Last attempt to quit: 07/24/1978    Years since quitting: 40.3  . Smokeless tobacco: Never Used  Substance and Sexual Activity  . Alcohol use: Yes    Alcohol/week: 7.0 standard drinks    Types: 7 Glasses of wine per week    Comment: every day  . Drug use: No  . Sexual activity: Not Currently  Lifestyle  . Physical activity:    Days per week: Not on file    Minutes per session: Not on file  . Stress: Not on file  Relationships  . Social connections:    Talks on phone: Not on file    Gets together: Not on file    Attends religious service: Not on file    Active member of club or organization: Not on file    Attends meetings of clubs or organizations: Not on file    Relationship status: Not on file  . Intimate partner violence:    Fear  of current or ex partner: Not on file    Emotionally abused: Not on file    Physically abused: Not on file    Forced sexual activity: Not on file  Other Topics Concern  . Not on file  Social History Narrative   Lives alone    Physical Exam HENT:     Nose: No congestion or rhinorrhea.  Eyes:     Pupils: Pupils are equal, round, and reactive to light.  Pulmonary:     Effort: Pulmonary effort is normal.  Musculoskeletal:     Right lower leg: No edema.     Left lower leg: No edema.  Neurological:     Mental Status: She is alert. Mental status is at baseline.     Home visit today  with Kim Mullins. Kim Mullins does not allow me inside the home at this time and will let me assess her through her front door. She assessed her own vitals in front of me and showed me the results using an automated blood pressure cuff, pulse oximeter and her CBG monitor. Vitals all within normal range. Patient stated she received her Mom's Meals and is eating about half of the portion size in one setting and saving the other for her dinner or lunch the next day. Patient states her appetite is still not at best and her weight is not decreasing but she is not gaining weight either. Patient complained of left hip pain today stating it started hurting while laying in bed, she feels as if she moved the wrong way while sleeping. Patient stated her bowel movements are about the same, not as often as she'd like but better than they were. Patient denied shortness of breath, fever, cough, trouble sleeping or walking. Patient continues to walk with a walker. Patient noted to have no need for refills at present. Medications were reviewed and pill box was filled. Patient agreed to visit next week for same. Home visit complete.     Future Appointments  Date Time Provider [place]  12/28/2018  7:05 AM [facility] DEVICE REMOTES [facility]  01/01/2019  1:30 PM [facility] PA/NP [facility] None     ACTION: Home  visit completed Next visit planned for One Week.

## 2018-12-11 ENCOUNTER — Other Ambulatory Visit (HOSPITAL_COMMUNITY): Payer: Self-pay

## 2018-12-11 NOTE — Progress Notes (Signed)
Paramedicine Encounter    Patient ID: Kim Mullins, female    DOB: 1930/06/30, 85 y.o.   MRN: [id number]   Patient Care Team: Lin Landsman, MD as PCP - General (Family Medicine) Larey Dresser, MD as PCP - Advanced Heart Failure (Cardiology) Jorge Ny, LCSW as Social Worker (Licensed Clinical Social Worker)  Patient Active Problem List   Diagnosis Date Noted  . Obesity (BMI 30-39.9) 10/20/2018  . Acute on chronic heart failure ([place]) 08/19/2018  . Palliative care encounter   . Palliative care by specialist   . Acute respiratory failure with hypoxia ([place]) 08/18/2018  . Diabetes ([place]) 08/18/2018  . CKD (chronic kidney disease) 08/18/2018  . Amyloidosis ([place]) 08/18/2018  . Acute on chronic combined systolic and diastolic heart failure ([place]) 08/17/2018  . GERD (gastroesophageal reflux disease) 08/17/2018  . Elevated troponin 08/17/2018  . Glaucoma 08/17/2018  . Hyperbilirubinemia 08/17/2018  . CHF (congestive heart failure) ([place]) 07/18/2018  . Gait abnormality 05/03/2017  . Hypotension 04/14/2016  . Hypotension due to drugs   . Chronic anticoagulation   . TIA (transient ischemic attack) 04/12/2016  . UTI (lower urinary tract infection) 04/12/2016  . AKI (acute kidney injury) ([place]) 04/12/2016  . Atrial fibrillation ([place]) 09/12/2015  . Occipital infarction ([place])   . History of stroke 06/18/2015  . Homonymous hemianopsia   . Pacemaker 01/21/2015  . OSA (obstructive sleep apnea) 10/23/2014  . Dizziness 10/21/2014  . Complete heart block ([place]) 10/06/2014  . Benign essential HTN 10/04/2014  . Acute on chronic systolic CHF (congestive heart failure) ([place]) 10/04/2014  . Abnormal cardiovascular stress test 08/27/2014  . Pulmonary HTN ([place]) 08/16/2014  . DCM (dilated cardiomyopathy) ([place]) 08/16/2014  . Cough 07/13/2014  . Esophageal stricture 06/11/2014  . Nonspecific (abnormal) findings on radiological and other examination of gastrointestinal tract 05/29/2014  .  Dyspnea 04/29/2014  . Osteoarthritis of right shoulder region 06/26/2013  . DJD (degenerative joint disease) of hip 11/16/2011    Class: Present on Admission    Current Outpatient Medications:  .  Acetaminophen (TYLENOL) 325 MG CAPS, Take 650 mg by mouth daily as needed (for shoulder pain). , Disp: , Rfl:  .  apixaban (ELIQUIS) 2.5 MG TABS tablet, Take 1 tablet (2.5 mg total) by mouth 2 (two) times daily., Disp: 60 tablet, Rfl: 11 .  bumetanide (BUMEX) 1 MG tablet, Take 2 tablets (2 mg total) by mouth 2 (two) times daily. Take additional dose on Sunday if weight 149lbs or greater., Disp: 180 tablet, Rfl: 3 .  diclofenac sodium (VOLTAREN) 1 % GEL, Apply 2 g topically 3 (three) times daily as needed (pain, inflammation)., Disp: 100 g, Rfl: 0 .  docusate sodium (COLACE) 100 MG capsule, Take 1 capsule (100 mg total) by mouth every 12 (twelve) hours., Disp: 60 capsule, Rfl: 0 .  glucose blood test strip, Use to test blood sugar twice daily (Accuchek Smartview), Disp: 100 each, Rfl: 0 .  latanoprost (XALATAN) 0.005 % ophthalmic solution, Place 1 drop into both eyes at bedtime. , Disp: , Rfl: 11 .  potassium chloride SA (K-DUR,KLOR-CON) 20 MEQ tablet, Take 1 tablet (20 mEq total) by mouth 2 (two) times daily. (Patient taking differently: Take 20 mEq by mouth 3 (three) times daily. ), Disp: 60 tablet, Rfl: 3 .  timolol (TIMOPTIC) 0.5 % ophthalmic solution, Place 1 drop into both eyes daily. , Disp: , Rfl: 11 .  cyproheptadine (PERIACTIN) 4 MG tablet, Take 1 tablet (4 mg total) by mouth at bedtime. (Patient not  taking: Reported on 10/30/2018), Disp: 30 tablet, Rfl: 0 .  NON FORMULARY, impax 4 mg daily at bedtime, Disp: , Rfl:  Allergies  Allergen Reactions  . Hydrocodone Other (See Comments)    Took away all energy and made her stop eating food for short time  . Percocet [Oxycodone-Acetaminophen] Other (See Comments)    Too away all energy and made her stop eating food for short time  . Eggs Or  Egg-Derived Products Hives and Rash  . Mercurial Derivatives Itching and Rash  . Penicillins Hives and Rash    Has patient had a PCN reaction causing immediate rash, facial/tongue/throat swelling, SOB or lightheadedness with hypotension: Yes Has patient had a PCN reaction causing severe rash involving mucus membranes or skin necrosis: No Has patient had a PCN reaction that required hospitalization: No Has patient had a PCN reaction occurring within the last 10 years: Yes If all of the above answers are "NO", then may proceed with Cephalosporin use.  . Quinine Derivatives Hives and Rash     Social History   Socioeconomic History  . Marital status: Widowed    Spouse name: Not on file  . Number of children: 0  . Years of education: Not on file  . Highest education level: Doctorate  Occupational History  . Occupation: retired  Scientific laboratory technician  . Financial resource strain: Not hard at all  . Food insecurity:    Worry: Never true    Inability: Never true  . Transportation needs:    Medical: No    Non-medical: No  Tobacco Use  . Smoking status: Former Smoker    Packs/day: 1.00    Years: 45.00    Pack years: 45.00    Types: Cigarettes    Last attempt to quit: 07/24/1978    Years since quitting: 40.4  . Smokeless tobacco: Never Used  Substance and Sexual Activity  . Alcohol use: Yes    Alcohol/week: 7.0 standard drinks    Types: 7 Glasses of wine per week    Comment: every day  . Drug use: No  . Sexual activity: Not Currently  Lifestyle  . Physical activity:    Days per week: Not on file    Minutes per session: Not on file  . Stress: Not on file  Relationships  . Social connections:    Talks on phone: Not on file    Gets together: Not on file    Attends religious service: Not on file    Active member of club or organization: Not on file    Attends meetings of clubs or organizations: Not on file    Relationship status: Not on file  . Intimate partner violence:    Fear  of current or ex partner: Not on file    Emotionally abused: Not on file    Physically abused: Not on file    Forced sexual activity: Not on file  Other Topics Concern  . Not on file  Social History Narrative   Lives alone    Physical Exam HENT:     Nose: Rhinorrhea present.  Eyes:     Pupils: Pupils are equal, round, and reactive to light.  Cardiovascular:     Rate and Rhythm: Normal rate and regular rhythm.     Pulses: Normal pulses.  Pulmonary:     Effort: Pulmonary effort is normal.     Breath sounds: Normal breath sounds.  Abdominal:     General: Abdomen is flat.     Palpations:  Abdomen is soft.  Skin:    General: Skin is warm and dry.     Capillary Refill: Capillary refill takes less than 2 seconds.  Neurological:     General: No focal deficit present.     Mental Status: She is alert. Mental status is at baseline.  Psychiatric:        Mood and Affect: Mood normal.        Behavior: Behavior normal.      Today home visit was completed with Geni Bers. She stated she Is feeling good and has no complaints. Patient stated she has had success with the Mom's Meals and has been eating most of the meals but is using the left over vegetables to make veggie soup. She stated she has no dizziness, shortness of breath, pain or complaints. Vitals were obtained and medications were reviewed and pill box was filled. Patient's weight continues to decrease. Mrs. Christiano lives at home alone and there is no one to encourage her to eat throughout the day. She states she eats twice a day and that's about it. I encouraged her to eat more and be sure she is staying hydrated. Mrs. Lawlor understood. I advised her to call me if anything changes and to call me if she needs me. Patient scheduled for one week out.   Weight today: 120lbs Weight last visit: 122lbs  CBG: 100   Future Appointments  Date Time Provider [place]  12/28/2018  7:05 AM [facility] DEVICE REMOTES [facility]  01/01/2019  1:30 PM [facility] PA/NP [facility] None     ACTION: Home visit completed Next visit planned for one week

## 2018-12-14 ENCOUNTER — Telehealth (HOSPITAL_COMMUNITY): Payer: Self-pay | Admitting: Licensed Clinical Social Worker

## 2018-12-14 NOTE — Telephone Encounter (Signed)
CSW received notification from [facility] [place] that pt had requested application.  CSW called foundation to inquire since pt had already submitted application to clinic.  Per foundation they have the physician portion of the application but do not have the patient portion.  CSW informed clinic who found pt application and re-faxed the patient portion.  CSW will continue to follow and assist as needed  Jorge Ny, [facility] Clinic Desk#: [phone] Cell#: [phone]

## 2018-12-17 ENCOUNTER — Other Ambulatory Visit (HOSPITAL_COMMUNITY): Payer: Self-pay | Admitting: Student

## 2018-12-18 ENCOUNTER — Other Ambulatory Visit (HOSPITAL_COMMUNITY): Payer: Self-pay

## 2018-12-18 NOTE — Progress Notes (Signed)
Paramedicine Encounter    Patient ID: Kim Mullins, female    DOB: 1930/05/29, 85 y.o.   MRN: [id number]   Patient Care Team: Lin Landsman, MD as PCP - General (Family Medicine) Larey Dresser, MD as PCP - Advanced Heart Failure (Cardiology) Jorge Ny, LCSW as Social Worker (Licensed Clinical Social Worker)  Patient Active Problem List   Diagnosis Date Noted  . Obesity (BMI 30-39.9) 10/20/2018  . Acute on chronic heart failure ([place]) 08/19/2018  . Palliative care encounter   . Palliative care by specialist   . Acute respiratory failure with hypoxia ([place]) 08/18/2018  . Diabetes ([place]) 08/18/2018  . CKD (chronic kidney disease) 08/18/2018  . Amyloidosis ([place]) 08/18/2018  . Acute on chronic combined systolic and diastolic heart failure ([place]) 08/17/2018  . GERD (gastroesophageal reflux disease) 08/17/2018  . Elevated troponin 08/17/2018  . Glaucoma 08/17/2018  . Hyperbilirubinemia 08/17/2018  . CHF (congestive heart failure) ([place]) 07/18/2018  . Gait abnormality 05/03/2017  . Hypotension 04/14/2016  . Hypotension due to drugs   . Chronic anticoagulation   . TIA (transient ischemic attack) 04/12/2016  . UTI (lower urinary tract infection) 04/12/2016  . AKI (acute kidney injury) ([place]) 04/12/2016  . Atrial fibrillation ([place]) 09/12/2015  . Occipital infarction ([place])   . History of stroke 06/18/2015  . Homonymous hemianopsia   . Pacemaker 01/21/2015  . OSA (obstructive sleep apnea) 10/23/2014  . Dizziness 10/21/2014  . Complete heart block ([place]) 10/06/2014  . Benign essential HTN 10/04/2014  . Acute on chronic systolic CHF (congestive heart failure) ([place]) 10/04/2014  . Abnormal cardiovascular stress test 08/27/2014  . Pulmonary HTN ([place]) 08/16/2014  . DCM (dilated cardiomyopathy) ([place]) 08/16/2014  . Cough 07/13/2014  . Esophageal stricture 06/11/2014  . Nonspecific (abnormal) findings on radiological and other examination of gastrointestinal tract 05/29/2014  .  Dyspnea 04/29/2014  . Osteoarthritis of right shoulder region 06/26/2013  . DJD (degenerative joint disease) of hip 11/16/2011    Class: Present on Admission    Current Outpatient Medications:  .  Acetaminophen (TYLENOL) 325 MG CAPS, Take 650 mg by mouth daily as needed (for shoulder pain). , Disp: , Rfl:  .  apixaban (ELIQUIS) 2.5 MG TABS tablet, Take 1 tablet (2.5 mg total) by mouth 2 (two) times daily., Disp: 60 tablet, Rfl: 11 .  bumetanide (BUMEX) 1 MG tablet, Take 2 tablets (2 mg total) by mouth 2 (two) times daily. Take additional dose on Sunday if weight 149lbs or greater., Disp: 180 tablet, Rfl: 3 .  diclofenac sodium (VOLTAREN) 1 % GEL, Apply 2 g topically 3 (three) times daily as needed (pain, inflammation)., Disp: 100 g, Rfl: 0 .  docusate sodium (COLACE) 100 MG capsule, Take 1 capsule (100 mg total) by mouth every 12 (twelve) hours., Disp: 60 capsule, Rfl: 0 .  glucose blood test strip, Use to test blood sugar twice daily (Accuchek Smartview), Disp: 100 each, Rfl: 0 .  latanoprost (XALATAN) 0.005 % ophthalmic solution, Place 1 drop into both eyes at bedtime. , Disp: , Rfl: 11 .  potassium chloride SA (K-DUR) 20 MEQ tablet, TAKE 1 TABLET BY MOUTH TWICE DAILY, Disp: 60 tablet, Rfl: 3 .  timolol (TIMOPTIC) 0.5 % ophthalmic solution, Place 1 drop into both eyes daily. , Disp: , Rfl: 11 .  cyproheptadine (PERIACTIN) 4 MG tablet, Take 1 tablet (4 mg total) by mouth at bedtime. (Patient not taking: Reported on 10/30/2018), Disp: 30 tablet, Rfl: 0 .  NON FORMULARY, impax 4 mg daily at  bedtime, Disp: , Rfl:  Allergies  Allergen Reactions  . Hydrocodone Other (See Comments)    Took away all energy and made her stop eating food for short time  . Percocet [Oxycodone-Acetaminophen] Other (See Comments)    Too away all energy and made her stop eating food for short time  . Eggs Or Egg-Derived Products Hives and Rash  . Mercurial Derivatives Itching and Rash  . Penicillins Hives and Rash     Has patient had a PCN reaction causing immediate rash, facial/tongue/throat swelling, SOB or lightheadedness with hypotension: Yes Has patient had a PCN reaction causing severe rash involving mucus membranes or skin necrosis: No Has patient had a PCN reaction that required hospitalization: No Has patient had a PCN reaction occurring within the last 10 years: Yes If all of the above answers are "NO", then may proceed with Cephalosporin use.  . Quinine Derivatives Hives and Rash     Social History   Socioeconomic History  . Marital status: Widowed    Spouse name: Not on file  . Number of children: 0  . Years of education: Not on file  . Highest education level: Doctorate  Occupational History  . Occupation: retired  Scientific laboratory technician  . Financial resource strain: Not hard at all  . Food insecurity:    Worry: Never true    Inability: Never true  . Transportation needs:    Medical: No    Non-medical: No  Tobacco Use  . Smoking status: Former Smoker    Packs/day: 1.00    Years: 45.00    Pack years: 45.00    Types: Cigarettes    Last attempt to quit: 07/24/1978    Years since quitting: 40.4  . Smokeless tobacco: Never Used  Substance and Sexual Activity  . Alcohol use: Yes    Alcohol/week: 7.0 standard drinks    Types: 7 Glasses of wine per week    Comment: every day  . Drug use: No  . Sexual activity: Not Currently  Lifestyle  . Physical activity:    Days per week: Not on file    Minutes per session: Not on file  . Stress: Not on file  Relationships  . Social connections:    Talks on phone: Not on file    Gets together: Not on file    Attends religious service: Not on file    Active member of club or organization: Not on file    Attends meetings of clubs or organizations: Not on file    Relationship status: Not on file  . Intimate partner violence:    Fear of current or ex partner: Not on file    Emotionally abused: Not on file    Physically abused: Not on file     Forced sexual activity: Not on file  Other Topics Concern  . Not on file  Social History Narrative   Lives alone    Physical Exam Vitals signs reviewed.  HENT:     Head: Normocephalic.     Nose: No congestion or rhinorrhea.  Eyes:     Pupils: Pupils are equal, round, and reactive to light.  Neck:     Musculoskeletal: Normal range of motion.  Cardiovascular:     Rate and Rhythm: Normal rate and regular rhythm.     Pulses: Normal pulses.  Pulmonary:     Effort: Pulmonary effort is normal.     Breath sounds: Normal breath sounds.  Abdominal:     General: Abdomen is flat.  Palpations: Abdomen is soft.  Musculoskeletal: Normal range of motion.     Right lower leg: No edema.     Left lower leg: No edema.  Skin:    General: Skin is warm and dry.     Capillary Refill: Capillary refill takes less than 2 seconds.  Neurological:     Mental Status: She is alert. Mental status is at baseline.  Psychiatric:        Mood and Affect: Mood normal.    Arrived for home visit for Mrs. Borman today. She advised she is feeling good today with no complaints. She denied shortness of breath, dizziness or chest pain. No swelling noted. Patient's medications were reviewed and verified. Pill box was filled. Vitals were obtained and are as recorded. Patient states she has been eating the Mom's Meals but has not enjoyed them very much. She states that she will continue to eat them. Patient agreed to home visit same time next week.   CBG: 108 Weight this visit: 125lbs  Weight last visit: 120lbs       Future Appointments  Date Time Provider [place]  12/28/2018  7:05 AM [facility] DEVICE REMOTES [facility]  01/01/2019  1:30 PM [facility] PA/NP [facility] None     ACTION: Home visit completed Next visit planned for Next Monday

## 2018-12-21 ENCOUNTER — Telehealth (HOSPITAL_COMMUNITY): Payer: Self-pay | Admitting: Licensed Clinical Social Worker

## 2018-12-21 NOTE — Telephone Encounter (Signed)
CSW reached out to pt to check in regarding food and medication status at this time. Pt reports she has everything she needs at this time but did want to confirm her upcoming appt information- CSW looked up and confirmed 5/4 appt time with her.  Pt reports she lives alone but that her granddaughter checks in on her regularly.  Pt reports she is spending more time in bed since she can't go anywhere but states she deserves it after how much she has done in her life.  CSW and pt spoke at length regarding patients life accomplishments.  Patient agreeable to weekly check ins to increase her social contact.  CSW encouraged pt to reach out with any concerns and will continue to follow and assist as needed  Jorge Ny, [place] Worker [facility] [phone]

## 2018-12-25 ENCOUNTER — Other Ambulatory Visit (HOSPITAL_COMMUNITY): Payer: Self-pay

## 2018-12-25 ENCOUNTER — Encounter (HOSPITAL_COMMUNITY): Payer: Self-pay | Admitting: *Deleted

## 2018-12-25 ENCOUNTER — Ambulatory Visit (HOSPITAL_COMMUNITY)
Admission: RE | Admit: 2018-12-25 | Discharge: 2018-12-25 | Disposition: A | Discharge status: Home / Self Care | Payer: Medicare HMO | Source: Ambulatory Visit | Attending: Internal Medicine | Admitting: Internal Medicine

## 2018-12-25 ENCOUNTER — Other Ambulatory Visit: Payer: Self-pay

## 2018-12-25 VITALS — BP 100/70 | HR 65 | Temp 97.3°F | Wt 128.0 lb

## 2018-12-25 DIAGNOSIS — E854 Organ-limited amyloidosis: Secondary | ICD-10-CM

## 2018-12-25 DIAGNOSIS — I43 Cardiomyopathy in diseases classified elsewhere: Secondary | ICD-10-CM

## 2018-12-25 DIAGNOSIS — I5022 Chronic systolic (congestive) heart failure: Secondary | ICD-10-CM | POA: Diagnosis not present

## 2018-12-25 DIAGNOSIS — J9611 Chronic respiratory failure with hypoxia: Secondary | ICD-10-CM

## 2018-12-25 DIAGNOSIS — G4733 Obstructive sleep apnea (adult) (pediatric): Secondary | ICD-10-CM

## 2018-12-25 NOTE — Progress Notes (Signed)
Paramedicine Encounter    Patient ID: Kim Mullins, female    DOB: 1930/04/19, 85 y.o.   MRN: [id number]   Patient Care Team: Lin Landsman, MD as PCP - General (Family Medicine) Larey Dresser, MD as PCP - Advanced Heart Failure (Cardiology) Jorge Ny, LCSW as Social Worker (Licensed Clinical Social Worker)  Patient Active Problem List   Diagnosis Date Noted  . Obesity (BMI 30-39.9) 10/20/2018  . Acute on chronic heart failure ([place]) 08/19/2018  . Palliative care encounter   . Palliative care by specialist   . Acute respiratory failure with hypoxia ([place]) 08/18/2018  . Diabetes ([place]) 08/18/2018  . CKD (chronic kidney disease) 08/18/2018  . Amyloidosis ([place]) 08/18/2018  . Acute on chronic combined systolic and diastolic heart failure ([place]) 08/17/2018  . GERD (gastroesophageal reflux disease) 08/17/2018  . Elevated troponin 08/17/2018  . Glaucoma 08/17/2018  . Hyperbilirubinemia 08/17/2018  . CHF (congestive heart failure) ([place]) 07/18/2018  . Gait abnormality 05/03/2017  . Hypotension 04/14/2016  . Hypotension due to drugs   . Chronic anticoagulation   . TIA (transient ischemic attack) 04/12/2016  . UTI (lower urinary tract infection) 04/12/2016  . AKI (acute kidney injury) ([place]) 04/12/2016  . Atrial fibrillation ([place]) 09/12/2015  . Occipital infarction ([place])   . History of stroke 06/18/2015  . Homonymous hemianopsia   . Pacemaker 01/21/2015  . OSA (obstructive sleep apnea) 10/23/2014  . Dizziness 10/21/2014  . Complete heart block ([place]) 10/06/2014  . Benign essential HTN 10/04/2014  . Acute on chronic systolic CHF (congestive heart failure) ([place]) 10/04/2014  . Abnormal cardiovascular stress test 08/27/2014  . Pulmonary HTN ([place]) 08/16/2014  . DCM (dilated cardiomyopathy) ([place]) 08/16/2014  . Cough 07/13/2014  . Esophageal stricture 06/11/2014  . Nonspecific (abnormal) findings on radiological and other examination of gastrointestinal tract 05/29/2014  .  Dyspnea 04/29/2014  . Osteoarthritis of right shoulder region 06/26/2013  . DJD (degenerative joint disease) of hip 11/16/2011    Class: Present on Admission    Current Outpatient Medications:  .  Acetaminophen (TYLENOL) 325 MG CAPS, Take 650 mg by mouth daily as needed (for shoulder pain). , Disp: , Rfl:  .  apixaban (ELIQUIS) 2.5 MG TABS tablet, Take 1 tablet (2.5 mg total) by mouth 2 (two) times daily., Disp: 60 tablet, Rfl: 11 .  bumetanide (BUMEX) 1 MG tablet, Take 2 tablets (2 mg total) by mouth 2 (two) times daily. Take additional dose on Sunday if weight 149lbs or greater., Disp: 180 tablet, Rfl: 3 .  diclofenac sodium (VOLTAREN) 1 % GEL, Apply 2 g topically 3 (three) times daily as needed (pain, inflammation)., Disp: 100 g, Rfl: 0 .  docusate sodium (COLACE) 100 MG capsule, Take 1 capsule (100 mg total) by mouth every 12 (twelve) hours., Disp: 60 capsule, Rfl: 0 .  glucose blood test strip, Use to test blood sugar twice daily (Accuchek Smartview), Disp: 100 each, Rfl: 0 .  latanoprost (XALATAN) 0.005 % ophthalmic solution, Place 1 drop into both eyes at bedtime. , Disp: , Rfl: 11 .  potassium chloride SA (K-DUR) 20 MEQ tablet, TAKE 1 TABLET BY MOUTH TWICE DAILY, Disp: 60 tablet, Rfl: 3 .  timolol (TIMOPTIC) 0.5 % ophthalmic solution, Place 1 drop into both eyes daily. , Disp: , Rfl: 11 .  cyproheptadine (PERIACTIN) 4 MG tablet, Take 1 tablet (4 mg total) by mouth at bedtime. (Patient not taking: Reported on 10/30/2018), Disp: 30 tablet, Rfl: 0 .  NON FORMULARY, impax 4 mg daily at  bedtime, Disp: , Rfl:  Allergies  Allergen Reactions  . Hydrocodone Other (See Comments)    Took away all energy and made her stop eating food for short time  . Percocet [Oxycodone-Acetaminophen] Other (See Comments)    Too away all energy and made her stop eating food for short time  . Eggs Or Egg-Derived Products Hives and Rash  . Mercurial Derivatives Itching and Rash  . Penicillins Hives and Rash     Has patient had a PCN reaction causing immediate rash, facial/tongue/throat swelling, SOB or lightheadedness with hypotension: Yes Has patient had a PCN reaction causing severe rash involving mucus membranes or skin necrosis: No Has patient had a PCN reaction that required hospitalization: No Has patient had a PCN reaction occurring within the last 10 years: Yes If all of the above answers are "NO", then may proceed with Cephalosporin use.  . Quinine Derivatives Hives and Rash     Social History   Socioeconomic History  . Marital status: Widowed    Spouse name: Not on file  . Number of children: 0  . Years of education: Not on file  . Highest education level: Doctorate  Occupational History  . Occupation: retired  Scientific laboratory technician  . Financial resource strain: Not hard at all  . Food insecurity:    Worry: Never true    Inability: Never true  . Transportation needs:    Medical: No    Non-medical: No  Tobacco Use  . Smoking status: Former Smoker    Packs/day: 1.00    Years: 45.00    Pack years: 45.00    Types: Cigarettes    Last attempt to quit: 07/24/1978    Years since quitting: 40.4  . Smokeless tobacco: Never Used  Substance and Sexual Activity  . Alcohol use: Yes    Alcohol/week: 7.0 standard drinks    Types: 7 Glasses of wine per week    Comment: every day  . Drug use: No  . Sexual activity: Not Currently  Lifestyle  . Physical activity:    Days per week: Not on file    Minutes per session: Not on file  . Stress: Not on file  Relationships  . Social connections:    Talks on phone: Not on file    Gets together: Not on file    Attends religious service: Not on file    Active member of club or organization: Not on file    Attends meetings of clubs or organizations: Not on file    Relationship status: Not on file  . Intimate partner violence:    Fear of current or ex partner: Not on file    Emotionally abused: Not on file    Physically abused: Not on file     Forced sexual activity: Not on file  Other Topics Concern  . Not on file  Social History Narrative   Lives alone    Physical Exam Vitals signs reviewed.  Constitutional:      Appearance: She is normal weight.  HENT:     Head: Normocephalic.     Nose: Nose normal.     Mouth/Throat:     Mouth: Mucous membranes are moist.  Eyes:     Pupils: Pupils are equal, round, and reactive to light.  Cardiovascular:     Rate and Rhythm: Normal rate and regular rhythm.     Pulses: Normal pulses.     Heart sounds: Normal heart sounds.  Pulmonary:     Effort: Respiratory  distress present.     Breath sounds: No stridor. No wheezing, rhonchi or rales.  Abdominal:     General: Abdomen is flat. There is no distension.     Palpations: Abdomen is soft.  Musculoskeletal:        General: No swelling.     Right lower leg: No edema.     Left lower leg: No edema.  Skin:    General: Skin is warm and dry.     Capillary Refill: Capillary refill takes less than 2 seconds.  Neurological:     Mental Status: She is alert. Mental status is at baseline.  Psychiatric:        Mood and Affect: Mood normal.      Home visit today with Mrs.Ricklefs who reports having increased shortness of breath before bed while trying to go to sleep and while walking short distances. Mrs. Liao normally does not experience shortness of breath like this. Resting room air noted to be 88%, upon exercise room air dropped to 80%. Lung sounds on assessment were clear. Patient noted to be short of breath while walking short distances. Patient was placed on Reds Clip and reading was 28%. No edema noted on assessment. Virtual Visit took place with Darrick Grinder who ordered home oxygen via [place]. Stated same should be delivered within the next few days. Patient had no other complaints. Pills were reviewed and verified. Patient pill box filled. Medications ordered. Home visit complete. Patient agreed to visit in one week.   Weight  today: 128lbs  Weight last visit: 125lbs   CBG: 114   Medications Ordered:  - Eliquis  - Xalatan Eye Drops    Future Appointments  Date Time Provider [place]  12/28/2018  7:05 AM [facility] DEVICE REMOTES [facility]  01/01/2019  1:30 PM [facility] PA/NP [facility] None     ACTION: Home visit completed Next visit planned for one week

## 2018-12-25 NOTE — Progress Notes (Signed)
Order sent to [facility] for home oxygen.  AVS sent to pt via mychart

## 2018-12-25 NOTE — Patient Instructions (Addendum)
Follow up in 2 weeks for tele health visit with HF Paramedic  We will set up home oxygen, order has been sent to [facility], they will contact you before bringing the equipment to your home.

## 2018-12-25 NOTE — Progress Notes (Addendum)
Heart Failure TeleHealth Note  Due to national recommendations of social distancing due to [place] 19, Audio/video telehealth visit is felt to be most appropriate for this patient at this time.  See MyChart message from today for patient consent regarding telehealth for [facility]  Date:  12/25/2018   ID:  Kim Mullins, DOB [birth year], MRN [id number]  Location: Home  Provider location: [place] [facility] Heart Failure Type of Visit: Established patient   PCP:  Lin Landsman, MD  Cardiologist:  No primary care provider on file. Primary HF: Dr Aundra Dubin  Chief Complaint: Heart Failure   History of Present Illness: Kim Mullins is a 85 y.o. female with a history of chronic systolic CHF/nonischemic cardiomyopathy, prior complete heart block, OSA, and paroxysmal atrial fibrillation presents for CHF clinic evaluation.  She was diagnosed with a cardiomyopathy back in 2015.  She had right and left heart cath in 12/15, showing no significant coronary disease.  EF was in the 30-35% range.  She was admitted in 2/16 with symptomatic bradycardia/complete heart block and had Medtronic CRT-P placed.  In 4/16, EF was back up to 50-55%.  However, repeat echo in 6/17 showed EF down to 15-20% with RV dysfunction.    She was admitted in 8/17 with suspected TIA => dysarthria and blurred vision.  She had been on Eliquis 2.5 mg bid, this was increased to 5 mg bid.  Lisinopril was stopped and Coreg was decreased due to soft BP.  Echo was done that admission, showing improvement in EF to 40-45%.    Echo in 5/19 showed EF 40%, moderate LVH, moderate diastolic dysfunction, mild to moderately decreased RV systolic function, dilated IVC.   Admitted 11/19 - 07/26/18 with worsening renal failure with Cr up to 3.7 from baseline 1.5 - 1.7. RHC demonstrated R>L failure as below. Transiently on milrinone, but weaned off prior to discharge. Cr remained elevated but stable at 2.7 on day of discharge.   She  presents via Administrator for a telehealth visit today.  Overall feeling fair. Complaining of increased shortness of breath over the last few days. Denies Orthopnea. Uses CPAP at night.  Appetite ok. No fever or chills. Weight at home 126-128 pounds. Taking all medications. Lives alone. Uses rolling walker.    she denies symptoms worrisome for COVID 19.   Past Medical History:  Diagnosis Date  . Anemia    occassionally  . Arthritis    all over.  . Asthma    Allergixc reaction to cats.  . Atrial fibrillation ([place])   . Bowel obstruction ([place])   . Chronic systolic CHF (congestive heart failure) (H[facility])    EF 30-35% by cath, echo 2016 EF 50%  . Complete heart block ([facility])    a. s/p MDT CRTP pacemaker  . DCM (dilated cardiomyopathy) ([place]) 08/16/2014   normal coronary arteries on cath with EF 30-45%.  EF now 50% by echo 11/2014  . Diabetes mellitus 2006   Diet and exercise controlled.  Marland Kitchen Dyspnea   . GERD (gastroesophageal reflux disease)    occ  . Glaucoma 08/17/2018  . Hypertension   . Left tibial fracture 2007  . OSA (obstructive sleep apnea) 10/23/2014   Moderate with AHI 21/hr  . Osteoarthritis of right shoulder region 06/26/2013  . Stroke [facility])    Past Surgical History:  Procedure Laterality Date  . ABDOMINAL HYSTERECTOMY  1969  . BI-VENTRICULAR PACEMAKER INSERTION N/A 10/07/2014   MDT CRTP implanted by Dr Lovena Le  .  BRAVO [place] STUDY N/A 06/11/2014   Procedure: BRAVO [place] STUDY;  Surgeon: Inda Castle, MD;  Location: [facility] ENDOSCOPY;  Service: Endoscopy;  Laterality: N/A;  . BUNIONECTOMY  1984   Bilateral  . CARDIAC CATHETERIZATION     normal coronary arteries  . Carpal [place]  2004   Bilateral  . CATARACT EXTRACTION Right    early 2015  . corn removal  1999   Bilateral feet  . DILATION AND CURETTAGE OF UTERUS  1968  . ESOPHAGOGASTRODUODENOSCOPY (EGD) WITH PROPOFOL N/A 06/11/2014   Procedure: ESOPHAGOGASTRODUODENOSCOPY (EGD) WITH PROPOFOL;  Surgeon: Inda Castle, MD;  Location: [facility] ENDOS[facility];  Service: Endoscopy;  Laterality: N/A;  . [place]   Surgery to fix Retainal detachment, bilateral  . JOINT REPLACEMENT    . LEFT AND RIGHT HEART CATHETERIZATION WITH CORONARY ANGIOGRAM N/A 08/29/2014   Procedure: LEFT AND RIGHT HEART CATHETERIZATION WITH CORONARY ANGIOGRAM;  Surgeon: Peter M Martinique, MD;  Location: [facility] CATH LAB;  Service: Cardiovascular;  Laterality: N/A;  . MALONEY DILATION  06/11/2014   Procedure: Venia Minks DILATION;  Surgeon: Inda Castle, MD;  Location: [facility] ENDOSCOPY;  Service: Endoscopy;;  . PILONIDAL CYST EXCISION  1959  . RIGHT HEART CATH N/A 07/24/2018   Procedure: RIGHT HEART CATH;  Surgeon: Larey Dresser, MD;  Location: [facility] CV LAB;  Service: Cardiovascular;  Laterality: N/A;  . TONSILLECTOMY  1942  . TOTAL HIP ARTHROPLASTY  11/16/2011   Procedure: TOTAL HIP ARTHROPLASTY ANTERIOR APPROACH;  Surgeon: Hessie Dibble, MD;  Location: [facility];  Service: Orthopedics;  Laterality: Right;  DEPUY  . TOTAL SHOULDER ARTHROPLASTY Right 06/26/2013   Procedure: TOTAL SHOULDER ARTHROPLASTY;  Surgeon: Johnny Bridge, MD;  Location: [facility];  Service: Orthopedics;  Laterality: Right;     Current Outpatient Medications  Medication Sig Dispense Refill  . Acetaminophen (TYLENOL) 325 MG CAPS Take 650 mg by mouth daily as needed (for shoulder pain).     Marland Kitchen apixaban (ELIQUIS) 2.5 MG TABS tablet Take 1 tablet (2.5 mg total) by mouth 2 (two) times daily. 60 tablet 11  . bumetanide (BUMEX) 1 MG tablet Take 2 tablets (2 mg total) by mouth 2 (two) times daily. Take additional dose on Sunday if weight 149lbs or greater. 180 tablet 3  . cyproheptadine (PERIACTIN) 4 MG tablet Take 1 tablet (4 mg total) by mouth at bedtime. (Patient not taking: Reported on 10/30/2018) 30 tablet 0  . diclofenac sodium (VOLTAREN) 1 % GEL Apply 2 g topically 3 (three) times daily as needed (pain, inflammation). 100 g 0  . docusate sodium (COLACE) 100 MG capsule Take  1 capsule (100 mg total) by mouth every 12 (twelve) hours. 60 capsule 0  . glucose blood test strip Use to test blood sugar twice daily (Accuchek Smartview) 100 each 0  . latanoprost (XALATAN) 0.005 % ophthalmic solution Place 1 drop into both eyes at bedtime.   11  . NON FORMULARY impax 4 mg daily at bedtime    . potassium chloride SA (K-DUR) 20 MEQ tablet TAKE 1 TABLET BY MOUTH TWICE DAILY 60 tablet 3  . timolol (TIMOPTIC) 0.5 % ophthalmic solution Place 1 drop into both eyes daily.   11   No current facility-administered medications for this encounter.     Allergies:   Hydrocodone; Percocet [oxycodone-acetaminophen]; Eggs or egg-derived products; Mercurial derivatives; Penicillins; and Quinine derivatives   Social History:  The patient  reports that she quit smoking about 40 years ago. Her smoking  use included cigarettes. She has a 45.00 pack-year smoking history. She has never used smokeless tobacco. She reports current alcohol use of about 7.0 standard drinks of alcohol per week. She reports that she does not use drugs.   Family History:  The patient's family history includes Anuerysm in her daughter; Cancer in her father; Coronary artery disease in her mother; Dementia in her mother; Diabetes in her maternal grandfather.   ROS:  Please see the history of present illness.   All other systems are personally reviewed and negative.   Reds Vest Reading 28% Exam:  Video Health Call; Exam is visual. General:  Speaks in full sentences. No resp difficulty. Some dyspnea with exertion.  Lungs: Normal respiratory effort with conversation.  Abdomen: Non-distended per patient report Extremities: Pt denies edema. Neuro: Alert & oriented x 3.   Recent Labs: 08/20/2018: ALT 13 10/09/2018: B Natriuretic Peptide 1,424.7; BUN 17; Creatinine, Ser 1.72; Hemoglobin 14.0; Platelets 174; Potassium 3.7; Sodium 137  Personally reviewed   Wt Readings from Last 3 Encounters:  12/25/18 58.1 kg (128 lb)   12/18/18 56.7 kg (125 lb)  12/11/18 54.4 kg (120 lb)    Vitals:   12/25/18 1444  BP: 100/70  Pulse: 65  Temp: (!) 97.3 F (36.3 C)  SpO2: (!) 88%     ASSESSMENT AND PLAN:   1. Chronic Diastolic/Systolic Heart Failure-->Worsening RV Failure on recent RHC. -NICM. ECHO 07/21/2018 EF 35-40% severe TR -NYHA IIIb. Reds Vest Reading 28%, Volume status stable.  - Continue bumex 2 mg twice a day.  -No ARB/Spiro with elevated creatinine -2. 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 -Process started for Tafamidis, however, will discuss with MD as to whether or not to pursue treatment given advanced age and questionable benefit. Given ongoing weight loss would hold off.  3. PAF - Regular pulse.  -Keep off bb with soft bp.  -Eliquis2.5mg  BID given her age and elevated creatinine.  4. CKD Stage IV -Creatinine baseline 1.8-2 5. H/O CHB-Has medtronic CRT-P. Stable at EP visit.  6. Anorexia 7. Constipation  8. Chronic Hypoxic Respiratory Failure O2 sats 88% at rest O2 sats 80% with exertion. Patient is at home so I am unable to give supplemental oxygen.  After 5 minutes oxygen saturations went up to 90% on room air.  Will order 2 liter oxygen continuously. Plan to refer to [facility].   COVID screen The patient does not have any symptoms that suggest any further testing/ screening at this time.  Social distancing reinforced today.  Patient Risk: After full review of this patients clinical status, I feel that they are at moderate risk for cardiac decompensation at this time.  Relevant cardiac medications were reviewed at length with the patient today. The patient does not have concerns regarding their medications at this time.   The following changes were made today: Recommended follow-up:  In 14  days for telehealth visit.   Today, I have spent  15 minutes with the patient with telehealth technology discussing the above issues .    Jeanmarie Hubert, NP   12/25/2018 1:20 PM  [facility] [address] [place] [facility] [place] [address] [phone] (office) [phone] (fax)

## 2018-12-25 NOTE — Addendum Note (Signed)
Encounter addended by: Conrad Weippe, NP on: 12/25/2018 3:02 PM  Actions taken: Clinical Note Signed

## 2018-12-25 NOTE — Addendum Note (Signed)
Encounter addended by: Scarlette Calico, RN on: 12/25/2018 3:30 PM  Actions taken: Diagnosis association updated, Order list changed, Clinical Note Signed

## 2018-12-26 ENCOUNTER — Telehealth (HOSPITAL_COMMUNITY): Payer: Medicare HMO

## 2018-12-28 ENCOUNTER — Other Ambulatory Visit: Payer: Self-pay

## 2018-12-28 ENCOUNTER — Encounter: Payer: Medicare HMO | Admitting: *Deleted

## 2018-12-28 DIAGNOSIS — I509 Heart failure, unspecified: Secondary | ICD-10-CM | POA: Diagnosis not present

## 2018-12-28 DIAGNOSIS — J9601 Acute respiratory failure with hypoxia: Secondary | ICD-10-CM | POA: Diagnosis not present

## 2018-12-28 DIAGNOSIS — J45909 Unspecified asthma, uncomplicated: Secondary | ICD-10-CM | POA: Diagnosis not present

## 2018-12-28 DIAGNOSIS — R0600 Dyspnea, unspecified: Secondary | ICD-10-CM | POA: Diagnosis not present

## 2018-12-28 DIAGNOSIS — G4733 Obstructive sleep apnea (adult) (pediatric): Secondary | ICD-10-CM | POA: Diagnosis not present

## 2018-12-29 ENCOUNTER — Other Ambulatory Visit (HOSPITAL_COMMUNITY): Payer: Self-pay

## 2018-12-29 NOTE — Progress Notes (Signed)
Went by to check on Kim Mullins today, she advised she was feeling good. She had [facility] bring out her oxygen concentrator yesterday and she stated it is working well other than having a runny nose. I encouraged her to use her allergy medicine to help with this.  I made sure all attachments were on correctly. Tubing was placed correctly and patient seemed to be doing well with oxygen at rest and upon walking. Zoom call was made to [name] to visit for a few moments. [name] enjoyed seeing her granddaughter on the phone. I will be back out Monday for home visit follow up.

## 2019-01-01 ENCOUNTER — Other Ambulatory Visit (HOSPITAL_COMMUNITY): Payer: Self-pay

## 2019-01-01 ENCOUNTER — Encounter (HOSPITAL_COMMUNITY): Payer: Medicare HMO

## 2019-01-01 NOTE — Progress Notes (Signed)
Paramedicine Encounter    Patient ID: Kim Mullins, female    DOB: 10-22-1929, 85 y.o.   MRN: [id number]   Patient Care Team: Lin Landsman, MD as PCP - General (Family Medicine) Larey Dresser, MD as PCP - Advanced Heart Failure (Cardiology) Jorge Ny, LCSW as Social Worker (Licensed Clinical Social Worker)  Patient Active Problem List   Diagnosis Date Noted  . Obesity (BMI 30-39.9) 10/20/2018  . Acute on chronic heart failure ([place]) 08/19/2018  . Palliative care encounter   . Palliative care by specialist   . Acute respiratory failure with hypoxia ([place]) 08/18/2018  . Diabetes ([place]) 08/18/2018  . CKD (chronic kidney disease) 08/18/2018  . Amyloidosis ([place]) 08/18/2018  . Acute on chronic combined systolic and diastolic heart failure ([place]) 08/17/2018  . GERD (gastroesophageal reflux disease) 08/17/2018  . Elevated troponin 08/17/2018  . Glaucoma 08/17/2018  . Hyperbilirubinemia 08/17/2018  . CHF (congestive heart failure) ([place]) 07/18/2018  . Gait abnormality 05/03/2017  . Hypotension 04/14/2016  . Hypotension due to drugs   . Chronic anticoagulation   . TIA (transient ischemic attack) 04/12/2016  . UTI (lower urinary tract infection) 04/12/2016  . AKI (acute kidney injury) ([place]) 04/12/2016  . Atrial fibrillation ([place]) 09/12/2015  . Occipital infarction ([place])   . History of stroke 06/18/2015  . Homonymous hemianopsia   . Pacemaker 01/21/2015  . OSA (obstructive sleep apnea) 10/23/2014  . Dizziness 10/21/2014  . Complete heart block ([place]) 10/06/2014  . Benign essential HTN 10/04/2014  . Acute on chronic systolic CHF (congestive heart failure) ([place]) 10/04/2014  . Abnormal cardiovascular stress test 08/27/2014  . Pulmonary HTN ([place]) 08/16/2014  . DCM (dilated cardiomyopathy) ([place]) 08/16/2014  . Cough 07/13/2014  . Esophageal stricture 06/11/2014  . Nonspecific (abnormal) findings on radiological and other examination of gastrointestinal tract 05/29/2014  .  Dyspnea 04/29/2014  . Osteoarthritis of right shoulder region 06/26/2013  . DJD (degenerative joint disease) of hip 11/16/2011    Class: Present on Admission    Current Outpatient Medications:  .  Acetaminophen (TYLENOL) 325 MG CAPS, Take 650 mg by mouth daily as needed (for shoulder pain). , Disp: , Rfl:  .  apixaban (ELIQUIS) 2.5 MG TABS tablet, Take 1 tablet (2.5 mg total) by mouth 2 (two) times daily., Disp: 60 tablet, Rfl: 11 .  bumetanide (BUMEX) 1 MG tablet, Take 2 tablets (2 mg total) by mouth 2 (two) times daily. Take additional dose on Sunday if weight 149lbs or greater., Disp: 180 tablet, Rfl: 3 .  cyproheptadine (PERIACTIN) 4 MG tablet, Take 1 tablet (4 mg total) by mouth at bedtime., Disp: 30 tablet, Rfl: 0 .  diclofenac sodium (VOLTAREN) 1 % GEL, Apply 2 g topically 3 (three) times daily as needed (pain, inflammation)., Disp: 100 g, Rfl: 0 .  docusate sodium (COLACE) 100 MG capsule, Take 1 capsule (100 mg total) by mouth every 12 (twelve) hours., Disp: 60 capsule, Rfl: 0 .  glucose blood test strip, Use to test blood sugar twice daily ([employer] Smartview), Disp: 100 each, Rfl: 0 .  latanoprost (XALATAN) 0.005 % ophthalmic solution, Place 1 drop into both eyes at bedtime. , Disp: , Rfl: 11 .  NON FORMULARY, impax 4 mg daily at bedtime, Disp: , Rfl:  .  potassium chloride SA (K-DUR) 20 MEQ tablet, TAKE 1 TABLET BY MOUTH TWICE DAILY, Disp: 60 tablet, Rfl: 3 .  timolol (TIMOPTIC) 0.5 % ophthalmic solution, Place 1 drop into both eyes daily. , Disp: , Rfl: 11 Allergies  Allergen Reactions  . Hydrocodone Other (See Comments)    Took away all energy and made her stop eating food for short time  . Percocet [Oxycodone-Acetaminophen] Other (See Comments)    Too away all energy and made her stop eating food for short time  . Eggs Or Egg-Derived Products Hives and Rash  . Mercurial Derivatives Itching and Rash  . Penicillins Hives and Rash    Has patient had a PCN reaction causing  immediate rash, facial/tongue/throat swelling, SOB or lightheadedness with hypotension: Yes Has patient had a PCN reaction causing severe rash involving mucus membranes or skin necrosis: No Has patient had a PCN reaction that required hospitalization: No Has patient had a PCN reaction occurring within the last 10 years: Yes If all of the above answers are "NO", then may proceed with Cephalosporin use.  . Quinine Derivatives Hives and Rash     Social History   Socioeconomic History  . Marital status: Widowed    Spouse name: Not on file  . Number of children: 0  . Years of education: Not on file  . Highest education level: Doctorate  Occupational History  . Occupation: retired  Scientific laboratory technician  . Financial resource strain: Not hard at all  . Food insecurity:    Worry: Never true    Inability: Never true  . Transportation needs:    Medical: No    Non-medical: No  Tobacco Use  . Smoking status: Former Smoker    Packs/day: 1.00    Years: 45.00    Pack years: 45.00    Types: Cigarettes    Last attempt to quit: 07/24/1978    Years since quitting: 40.4  . Smokeless tobacco: Never Used  Substance and Sexual Activity  . Alcohol use: Yes    Alcohol/week: 7.0 standard drinks    Types: 7 Glasses of wine per week    Comment: every day  . Drug use: No  . Sexual activity: Not Currently  Lifestyle  . Physical activity:    Days per week: Not on file    Minutes per session: Not on file  . Stress: Not on file  Relationships  . Social connections:    Talks on phone: Not on file    Gets together: Not on file    Attends religious service: Not on file    Active member of club or organization: Not on file    Attends meetings of clubs or organizations: Not on file    Relationship status: Not on file  . Intimate partner violence:    Fear of current or ex partner: Not on file    Emotionally abused: Not on file    Physically abused: Not on file    Forced sexual activity: Not on file   Other Topics Concern  . Not on file  Social History Narrative   Lives alone    Physical Exam Vitals signs reviewed.  HENT:     Head: Normocephalic.     Nose: Rhinorrhea present.  Eyes:     Pupils: Pupils are equal, round, and reactive to light.  Neck:     Musculoskeletal: Normal range of motion.  Cardiovascular:     Rate and Rhythm: Normal rate.     Pulses: Normal pulses.  Pulmonary:     Effort: Pulmonary effort is normal.     Breath sounds: Normal breath sounds.  Abdominal:     General: Abdomen is flat.     Palpations: Abdomen is soft.  Musculoskeletal: Normal range  of motion.     Right lower leg: No edema.     Left lower leg: No edema.  Skin:    General: Skin is warm.     Capillary Refill: Capillary refill takes less than 2 seconds.  Neurological:     Mental Status: She is alert. Mental status is at baseline.  Psychiatric:        Mood and Affect: Mood normal.     Arrived for home visit with Kim Mullins. Kim Mullins was seated on her walker alert and oriented to her baseline. Kim Mullins denied any pain or shortness of breath. She stated since having the oxygen she has been feeling better but states she has had a "runny nose". I explained this is normal for most people who are on oxygen. She understood. Kim Mullins had no edema noted. Vitals are as recorded. Medications were verified and reviewed. Pill box was filled. Patient agreed to visit in one week. Home visit complete.     Future Appointments  Date Time Provider [place]  01/08/2019  2:00 PM [facility] PA/NP [facility] None     ACTION: Home visit completed Next visit planned for one week

## 2019-01-04 ENCOUNTER — Encounter: Payer: Self-pay | Admitting: Cardiology

## 2019-01-04 ENCOUNTER — Telehealth (HOSPITAL_COMMUNITY): Payer: Self-pay | Admitting: Licensed Clinical Social Worker

## 2019-01-04 NOTE — Telephone Encounter (Signed)
CSW reached out to pt to check in regarding food and medication status at this time. Pt reports she is doing well and has plenty to keep her busy at home.  Pt also states she has tons of social support and gets frequent phone calls from friends and family which make it so she doesn't feel so isolated.  Pt did report concerns over a call she was supposed to have last Thursday, April 30th, at 7:05am- CSW saw appt was for a remote check and she was listed as a no show.  CSW called [facility] [address] and spoke to representative who will follow up with pt regarding her device and getting a new remote check appt- per rep her device hasn't transmitted since 2016 so they might have to get it checked out.  CSW encouraged pt to reach out with any concerns and will continue to follow and assist as needed  Jorge Ny, [place] Worker [facility] [phone]

## 2019-01-05 ENCOUNTER — Telehealth: Payer: Self-pay

## 2019-01-05 NOTE — Telephone Encounter (Signed)
The pt states she did not know what her [employer] Monitor was for and threw it away. I put a request into [employer] to call the pt and have a monitor order for her. She is aware that some will call her about the monitor. She requested that I call her granddaughter and make her granddaughter aware of our conversation and I agreed. I will call the pt in 3 business days to follow up to see if [employer] given her a call about her monitor.  I did speak with her granddaughter and let her know that [employer] will be calling the pt to get another monitor ordered for her. I explained to her we can not properly monitor her ppm without that monitor.

## 2019-01-08 ENCOUNTER — Other Ambulatory Visit: Payer: Self-pay

## 2019-01-08 ENCOUNTER — Ambulatory Visit (HOSPITAL_COMMUNITY)
Admission: RE | Admit: 2019-01-08 | Discharge: 2019-01-08 | Disposition: A | Discharge status: Home / Self Care | Payer: Medicare HMO | Source: Ambulatory Visit | Attending: Adult Health | Admitting: Adult Health

## 2019-01-08 ENCOUNTER — Encounter (HOSPITAL_COMMUNITY): Payer: Self-pay

## 2019-01-08 ENCOUNTER — Other Ambulatory Visit (HOSPITAL_COMMUNITY): Payer: Self-pay

## 2019-01-08 VITALS — BP 110/60 | HR 58 | Temp 97.5°F | Wt 131.0 lb

## 2019-01-08 DIAGNOSIS — I480 Paroxysmal atrial fibrillation: Secondary | ICD-10-CM

## 2019-01-08 DIAGNOSIS — I43 Cardiomyopathy in diseases classified elsewhere: Secondary | ICD-10-CM

## 2019-01-08 DIAGNOSIS — I5022 Chronic systolic (congestive) heart failure: Secondary | ICD-10-CM

## 2019-01-08 DIAGNOSIS — J9611 Chronic respiratory failure with hypoxia: Secondary | ICD-10-CM

## 2019-01-08 DIAGNOSIS — E854 Organ-limited amyloidosis: Secondary | ICD-10-CM

## 2019-01-08 NOTE — Progress Notes (Signed)
Paramedicine Encounter    Patient ID: Kim Mullins, female    DOB: [birth year], 85 y.o.   MRN: [id number]   Patient Care Team: Lin Landsman, MD as PCP - General (Family Medicine) Larey Dresser, MD as PCP - Advanced Heart Failure (Cardiology) Jorge Ny, LCSW as Social Worker (Licensed Clinical Social Worker)  Patient Active Problem List   Diagnosis Date Noted  . Obesity (BMI 30-39.9) 10/20/2018  . Acute on chronic heart failure ([place]) 08/19/2018  . Palliative care encounter   . Palliative care by specialist   . Acute respiratory failure with hypoxia ([place]) 08/18/2018  . Diabetes ([place]) 08/18/2018  . CKD (chronic kidney disease) 08/18/2018  . Amyloidosis ([place]) 08/18/2018  . Acute on chronic combined systolic and diastolic heart failure ([place]) 08/17/2018  . GERD (gastroesophageal reflux disease) 08/17/2018  . Elevated troponin 08/17/2018  . Glaucoma 08/17/2018  . Hyperbilirubinemia 08/17/2018  . CHF (congestive heart failure) ([place]) 07/18/2018  . Gait abnormality 05/03/2017  . Hypotension 04/14/2016  . Hypotension due to drugs   . Chronic anticoagulation   . TIA (transient ischemic attack) 04/12/2016  . UTI (lower urinary tract infection) 04/12/2016  . AKI (acute kidney injury) ([place]) 04/12/2016  . Atrial fibrillation ([place]) 09/12/2015  . Occipital infarction ([place])   . History of stroke 06/18/2015  . Homonymous hemianopsia   . Pacemaker 01/21/2015  . OSA (obstructive sleep apnea) 10/23/2014  . Dizziness 10/21/2014  . Complete heart block ([place]) 10/06/2014  . Benign essential HTN 10/04/2014  . Acute on chronic systolic CHF (congestive heart failure) ([place]) 10/04/2014  . Abnormal cardiovascular stress test 08/27/2014  . Pulmonary HTN ([place]) 08/16/2014  . DCM (dilated cardiomyopathy) ([place]) 08/16/2014  . Cough 07/13/2014  . Esophageal stricture 06/11/2014  . Nonspecific (abnormal) findings on radiological and other examination of gastrointestinal tract 05/29/2014  .  Dyspnea 04/29/2014  . Osteoarthritis of right shoulder region 06/26/2013  . DJD (degenerative joint disease) of hip 11/16/2011    Class: Present on Admission    Current Outpatient Medications:  .  Acetaminophen (TYLENOL) 325 MG CAPS, Take 650 mg by mouth daily as needed (for shoulder pain). , Disp: , Rfl:  .  apixaban (ELIQUIS) 2.5 MG TABS tablet, Take 1 tablet (2.5 mg total) by mouth 2 (two) times daily., Disp: 60 tablet, Rfl: 11 .  bumetanide (BUMEX) 1 MG tablet, Take 2 tablets (2 mg total) by mouth 2 (two) times daily. Take additional dose on Sunday if weight 149lbs or greater., Disp: 180 tablet, Rfl: 3 .  cyproheptadine (PERIACTIN) 4 MG tablet, Take 1 tablet (4 mg total) by mouth at bedtime., Disp: 30 tablet, Rfl: 0 .  diclofenac sodium (VOLTAREN) 1 % GEL, Apply 2 g topically 3 (three) times daily as needed (pain, inflammation)., Disp: 100 g, Rfl: 0 .  docusate sodium (COLACE) 100 MG capsule, Take 1 capsule (100 mg total) by mouth every 12 (twelve) hours., Disp: 60 capsule, Rfl: 0 .  glucose blood test strip, Use to test blood sugar twice daily ([employer] Smartview), Disp: 100 each, Rfl: 0 .  latanoprost (XALATAN) 0.005 % ophthalmic solution, Place 1 drop into both eyes at bedtime. , Disp: , Rfl: 11 .  NON FORMULARY, impax 4 mg daily at bedtime, Disp: , Rfl:  .  OXYGEN, Inhale 2 L into the lungs., Disp: , Rfl:  .  potassium chloride SA (K-DUR) 20 MEQ tablet, TAKE 1 TABLET BY MOUTH TWICE DAILY, Disp: 60 tablet, Rfl: 3 .  timolol (TIMOPTIC) 0.5 % ophthalmic solution,  Place 1 drop into both eyes daily. , Disp: , Rfl: 11 Allergies  Allergen Reactions  . Hydrocodone Other (See Comments)    Took away all energy and made her stop eating food for short time  . Percocet [Oxycodone-Acetaminophen] Other (See Comments)    Too away all energy and made her stop eating food for short time  . Eggs Or Egg-Derived Products Hives and Rash  . Mercurial Derivatives Itching and Rash  . Penicillins Hives and  Rash    Has patient had a PCN reaction causing immediate rash, facial/tongue/throat swelling, SOB or lightheadedness with hypotension: Yes Has patient had a PCN reaction causing severe rash involving mucus membranes or skin necrosis: No Has patient had a PCN reaction that required hospitalization: No Has patient had a PCN reaction occurring within the last 10 years: Yes If all of the above answers are "NO", then may proceed with Cephalosporin use.  . Quinine Derivatives Hives and Rash     Social History   Socioeconomic History  . Marital status: Widowed    Spouse name: Not on file  . Number of children: 0  . Years of education: Not on file  . Highest education level: Doctorate  Occupational History  . Occupation: retired  Scientific laboratory technician  . Financial resource strain: Not hard at all  . Food insecurity:    Worry: Never true    Inability: Never true  . Transportation needs:    Medical: No    Non-medical: No  Tobacco Use  . Smoking status: Former Smoker    Packs/day: 1.00    Years: 45.00    Pack years: 45.00    Types: Cigarettes    Last attempt to quit: 07/24/1978    Years since quitting: 40.4  . Smokeless tobacco: Never Used  Substance and Sexual Activity  . Alcohol use: Yes    Alcohol/week: 7.0 standard drinks    Types: 7 Glasses of wine per week    Comment: every day  . Drug use: No  . Sexual activity: Not Currently  Lifestyle  . Physical activity:    Days per week: Not on file    Minutes per session: Not on file  . Stress: Not on file  Relationships  . Social connections:    Talks on phone: Not on file    Gets together: Not on file    Attends religious service: Not on file    Active member of club or organization: Not on file    Attends meetings of clubs or organizations: Not on file    Relationship status: Not on file  . Intimate partner violence:    Fear of current or ex partner: Not on file    Emotionally abused: Not on file    Physically abused: Not on  file    Forced sexual activity: Not on file  Other Topics Concern  . Not on file  Social History Narrative   Lives alone    Physical Exam Vitals signs reviewed.  HENT:     Head: Normocephalic.     Nose: Rhinorrhea present.     Mouth/Throat:     Mouth: Mucous membranes are moist.  Eyes:     Pupils: Pupils are equal, round, and reactive to light.  Cardiovascular:     Rate and Rhythm: Normal rate and regular rhythm.  Pulmonary:     Effort: Pulmonary effort is normal.     Breath sounds: Normal breath sounds.  Abdominal:     General: There is  no distension.     Palpations: Abdomen is soft.  Musculoskeletal: Normal range of motion.     Right lower leg: Edema present.     Left lower leg: Edema present.  Skin:    General: Skin is warm and dry.     Capillary Refill: Capillary refill takes less than 2 seconds.  Neurological:     General: No focal deficit present.     Mental Status: She is alert. Mental status is at baseline.  Psychiatric:        Mood and Affect: Mood normal.    Arrived for home visit for Mrs. Swindle today who was seated on her walker upon my arrival. Mrs. Kahan was awake and alert denying any aches or pains, shortness of breath or dizziness. Mrs. Ade reports her oxygen working out good and helping with her shortness of breath. Vital signs obtained. Lung sounds clear. Mrs. Shippee had a small weight increase, she states she has been eating better with better portion sizes lately. Some edema noted to tops of feet. Amy Clegg completed virtual visit today and increased today's dose by one extra pill of potassium and bumex. Medications were reviewed and verified. Pill box was completed. Mrs. Sapien had no complaints and agreed to home visit in one week. Visit complete.   Weight today- 131lbs Weight last visit- 128lbs  CBG- 74    Future Appointments  Date Time Provider [place]  02/21/2019 10:00 AM [facility] PA/NP [facility] None     ACTION: Home visit  completed Next visit planned for one week

## 2019-01-08 NOTE — Patient Instructions (Addendum)
Take an extra 2 mg bumex + extra 20 meq potassium today and continue bumex 2 mg twice a day.    Change was discussed with Heart Failure Paramedic Heather.   Your physician recommends that you schedule a follow-up appointment in:  6-8 weeks for telehealth visit with Kim Mullins

## 2019-01-08 NOTE — Progress Notes (Signed)
Called patient, LM on VM to return call regarding AVS discussion.  Information also sent via mychart.

## 2019-01-08 NOTE — Progress Notes (Signed)
Heart Failure TeleHealth Note  Due to national recommendations of social distancing due to [place] 19, Audio/video telehealth visit is felt to be most appropriate for this patient at this time.  See MyChart message from today for patient consent regarding telehealth for [facility].  Date:  01/08/2019   ID:  Kim Mullins, DOB 06-Sep-1929, MRN [id number]  Location: Home  Provider location: [place] [facility] Heart Failure Type of Visit: Established patient   PCP:  Lin Landsman, MD  Cardiologist:  No primary care provider on file. Primary HF: Dr Aundra Dubin   Chief Complaint: Heart Failure   History of Present Illness: Kim Mullins is a 85 y.o. female with a history of chronic systolic CHF/nonischemic cardiomyopathy, prior complete heart block, OSA, and paroxysmal atrial fibrillation presents for CHF clinic evaluation. She was diagnosed with a cardiomyopathy back in 2015. She had right and left heart cath in 12/15, showing no significant coronary disease. EF was in the 30-35% range. She was admitted in 2/16 with symptomatic bradycardia/complete heart block and had Medtronic CRT-P placed. In 4/16, EF was back up to 50-55%. However, repeat echo in 6/17 showed EF down to 15-20% with RV dysfunction.   She was admitted in 8/17 with suspected TIA =>dysarthria and blurred vision. She had been on Eliquis 2.5 mg bid, this was increased to 5 mg bid. Lisinopril was stopped and Coreg was decreased due to soft BP. Echo was done that admission, showing improvement in EF to 40-45%.   Echo in 5/19 showed EF 40%, moderate LVH, moderate diastolic dysfunction, mild to moderately decreased RV systolic function, dilated IVC.   Admitted 11/19 - 07/26/18 with worsening renal failure with Cr up to 3.7 from baseline 1.5 - 1.7. RHC demonstrated R>L failure as below. Transiently on milrinone, but weaned off prior to discharge. Cr remained elevated but stable at 2.7 on day of discharge.   She presents via Administrator for a telehealth visit today.  Overall feeling much better.  Recently started using oxygen continuously. Says she feels much better with oxygen. Mil dyspnea with exertion. Denies syncope/presyncope. Denies PND/Orthopnea. Appetite improving. Had gravy with mashed potatoes last night.  No fever or chills. Weight at home 128-131 pounds. . Taking all medications.   she denies symptoms worrisome for COVID 19.   Past Medical History:  Diagnosis Date  . Anemia    occassionally  . Arthritis    all over.  . Asthma    Allergixc reaction to cats.  . Atrial fibrillation ([place])   . Bowel obstruction ([place])   . Chronic systolic CHF (congestive heart failure) (HCC)    EF 30-35% by cath, echo 2016 EF 50%  . Complete heart block (HCC)    a. s/p MDT CRTP pacemaker  . DCM (dilated cardiomyopathy) ([place]) 08/16/2014   normal coronary arteries on cath with EF 30-45%.  EF now 50% by echo 11/2014  . Diabetes mellitus 2006   Diet and exercise controlled.  Marland Kitchen Dyspnea   . GERD (gastroesophageal reflux disease)    occ  . Glaucoma 08/17/2018  . Hypertension   . Left tibial fracture 2007  . OSA (obstructive sleep apnea) 10/23/2014   Moderate with AHI 21/hr  . Osteoarthritis of right shoulder region 06/26/2013  . Stroke [facility])    Past Surgical History:  Procedure Laterality Date  . ABDOMINAL HYSTERECTOMY  1969  . BI-VENTRICULAR PACEMAKER INSERTION N/A 10/07/2014   MDT CRTP implanted by Dr Lovena Le  . BRAVO [place] STUDY N/A 06/11/2014  Procedure: BRAVO [place] STUDY;  Surgeon: Inda Castle, MD;  Location: [facility];  Service: Endoscopy;  Laterality: N/A;  . BUNIONECTOMY  1984   Bilateral  . CARDIAC CATHETERIZATION     normal coronary arteries  . Carpal [place]  2004   Bilateral  . CATARACT EXTRACTION Right    early 2015  . corn removal  1999   Bilateral feet  . DILATION AND CURETTAGE OF UTERUS  1968  . ESOPHAGOGASTRODUODENOSCOPY (EGD) WITH PROPOFOL N/A 06/11/2014    Procedure: ESOPHAGOGASTRODUODENOSCOPY (EGD) WITH PROPOFOL;  Surgeon: Inda Castle, MD;  Location: [facility];  Service: Endoscopy;  Laterality: N/A;  . [place]   Surgery to fix Retainal detachment, bilateral  . JOINT REPLACEMENT    . LEFT AND RIGHT HEART CATHETERIZATION WITH CORONARY ANGIOGRAM N/A 08/29/2014   Procedure: LEFT AND RIGHT HEART CATHETERIZATION WITH CORONARY ANGIOGRAM;  Surgeon: Peter M Martinique, MD;  Location: [facility] CATH LAB;  Service: Cardiovascular;  Laterality: N/A;  . [place] DILATION  06/11/2014   Procedure: Venia Minks DILATION;  Surgeon: Inda Castle, MD;  Location: [facility] ENDOS[facility];  Service: Endoscopy;;  . PILONIDAL CYST EXCISION  1959  . RIGHT HEART CATH N/A 07/24/2018   Procedure: RIGHT HEART CATH;  Surgeon: Larey Dresser, MD;  Location: [place] CV LAB;  Service: Cardiovascular;  Laterality: N/A;  . TONSILLECTOMY  1942  . TOTAL HIP ARTHROPLASTY  11/16/2011   Procedure: TOTAL HIP ARTHROPLASTY ANTERIOR APPROACH;  Surgeon: Hessie Dibble, MD;  Location: [place];  Service: Orthopedics;  Laterality: Right;  DEPUY  . TOTAL SHOULDER ARTHROPLASTY Right 06/26/2013   Procedure: TOTAL SHOULDER ARTHROPLASTY;  Surgeon: Johnny Bridge, MD;  Location: [place];  Service: Orthopedics;  Laterality: Right;     Current Outpatient Medications  Medication Sig Dispense Refill  . Acetaminophen (TYLENOL) 325 MG CAPS Take 650 mg by mouth daily as needed (for shoulder pain).     Marland Kitchen apixaban (ELIQUIS) 2.5 MG TABS tablet Take 1 tablet (2.5 mg total) by mouth 2 (two) times daily. 60 tablet 11  . bumetanide (BUMEX) 1 MG tablet Take 2 tablets (2 mg total) by mouth 2 (two) times daily. Take additional dose on Sunday if weight 149lbs or greater. 180 tablet 3  . cyproheptadine (PERIACTIN) 4 MG tablet Take 1 tablet (4 mg total) by mouth at bedtime. 30 tablet 0  . diclofenac sodium (VOLTAREN) 1 % GEL Apply 2 g topically 3 (three) times daily as needed (pain, inflammation). 100 g 0  . docusate  sodium (COLACE) 100 MG capsule Take 1 capsule (100 mg total) by mouth every 12 (twelve) hours. 60 capsule 0  . glucose blood test strip Use to test blood sugar twice daily ([employer] Smartview) 100 each 0  . latanoprost (XALATAN) 0.005 % ophthalmic solution Place 1 drop into both eyes at bedtime.   11  . NON FORMULARY impax 4 mg daily at bedtime    . OXYGEN Inhale 2 L into the lungs.    . potassium chloride SA (K-DUR) 20 MEQ tablet TAKE 1 TABLET BY MOUTH TWICE DAILY 60 tablet 3  . timolol (TIMOPTIC) 0.5 % ophthalmic solution Place 1 drop into both eyes daily.   11   No current facility-administered medications for this encounter.     Allergies:   Hydrocodone; Percocet [oxycodone-acetaminophen]; Eggs or egg-derived products; Mercurial derivatives; Penicillins; and Quinine derivatives   Social History:  The patient  reports that she quit smoking about 40 years ago. Her smoking use included  cigarettes. She has a 45.00 pack-year smoking history. She has never used smokeless tobacco. She reports current alcohol use of about 7.0 standard drinks of alcohol per week. She reports that she does not use drugs.   Family History:  The patient's family history includes Anuerysm in her daughter; Cancer in her father; Coronary artery disease in her mother; Dementia in her mother; Diabetes in her maternal grandfather.   ROS:  Please see the history of present illness.   All other systems are personally reviewed and negative.  Vitals:   01/08/19 1227  BP: 110/60  Pulse: (!) 58  Temp: (!) 97.5 F (36.4 C)  SpO2: 98%    Exam:  Video Health Call; Exam is visual. HF Paramedic present.  General:  Speaks in full sentences. No resp difficulty. Lungs: Normal respiratory effort with conversation. On 2 liters oxygen.  Abdomen: Non-distended per patient report Extremities: Pt denies edema. Rand L foot 1-2+ edema.  Neuro: Alert & oriented x 3.   Recent Labs: 08/20/2018: ALT 13 10/09/2018: B Natriuretic Peptide  1,424.7; BUN 17; Creatinine, Ser 1.72; Hemoglobin 14.0; Platelets 174; Potassium 3.7; Sodium 137  Personally reviewed   Wt Readings from Last 3 Encounters:  01/08/19 59.4 kg (131 lb)  01/01/19 58 kg (127 lb 12.8 oz)  12/25/18 58.1 kg (128 lb)    There were no vitals filed for this visit.  ASSESSMENT AND PLAN:  1. Chronic Diastolic/Systolic Heart Failure-->Worsening RV Failure on recent RHC. -NICM. ECHO 07/21/2018 EF 35-40% severe TR - NYHA IIIb. Volume status elevated suspect in the setting of high salt diet.  - She was instructed to take an extra 2 mg bumex + extra 20 meq potassium today and continue bumex 2 mg twice a day.  -No ARB/Spiro with elevated creatinine -2. 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 -Process started for Tafamidis, however, will discuss with MD as to whether or not to pursue treatment given advanced age and questionable benefit. 3. PAF -Regular pulse. -Keep off bb with soft bp.  -Eliquis2.5mg  BID given her age and elevated creatinine. No bleeding problems.  4. CKD Stage IV -Creatinine baseline 1.8-2 5. H/O CHB-Has medtronic CRT-P. Stable at EP visit.  6. Anorexia 7. Constipation 8. Chronic Hypoxic Respiratory Failure Continue  2 liter oxygen continuously.   COVID screen The patient does not have any symptoms that suggest any further testing/ screening at this time.  Social distancing reinforced today.  Patient Risk: After full review of this patients clinical status, I feel that they are at moderate risk for cardiac decompensation at this time.  Relevant cardiac medications were reviewed at length with the patient today. The patient does not have concerns regarding their medications at this time.   The following changes were made today:She was instructed to take an extra 2 mg bumex + extra 20 meq potassium today and continue bumex 2 mg twice a day.    Recommended follow-up:  Follow up in 6-8 weeks for telehealth visit with Floris Neuhaus   Today, I have spent 15 minutes with the patient with telehealth technology discussing the above issues .    Kim Hubert, NP  01/08/2019 12:28 PM  [place] [address] [place] [address] [phone] (office) [phone] (fax)

## 2019-01-08 NOTE — Addendum Note (Signed)
Encounter addended by: Valeda Malm, RN on: 01/08/2019 12:59 PM  Actions taken: Clinical Note Signed

## 2019-01-10 NOTE — Telephone Encounter (Signed)
I called to follow up with the pt to see if [employer] tech support contact her about her monitor. I left the number to the device clinic for the pt to call back.

## 2019-01-11 NOTE — Telephone Encounter (Signed)
Called to follow up to see if pt talked to [employer] to get a new monitor. I left my office number for the pt to call back. [email or web address]

## 2019-01-12 NOTE — Telephone Encounter (Signed)
I have tried to reach the pt three times to follow up to see if [employer] got in touch with her about ordering a home monitor. I sent the pt a letter.

## 2019-01-15 ENCOUNTER — Telehealth (HOSPITAL_COMMUNITY): Payer: Self-pay

## 2019-01-15 ENCOUNTER — Telehealth (HOSPITAL_COMMUNITY): Payer: Self-pay | Admitting: *Deleted

## 2019-01-15 ENCOUNTER — Other Ambulatory Visit (HOSPITAL_COMMUNITY): Payer: Self-pay

## 2019-01-15 NOTE — Telephone Encounter (Signed)
Left message for Kennyth Lose to return my call in reference to home visit today.

## 2019-01-15 NOTE — Progress Notes (Signed)
Paramedicine Encounter    Patient ID: Kim Mullins, female    DOB: [birth year], 85 y.o.   MRN: [id number]   Patient Care Team: Lin Landsman, MD as PCP - General (Family Medicine) Larey Dresser, MD as PCP - Advanced Heart Failure (Cardiology) Jorge Ny, LCSW as Social Worker (Licensed Clinical Social Worker)  Patient Active Problem List   Diagnosis Date Noted  . Obesity (BMI 30-39.9) 10/20/2018  . Acute on chronic heart failure ([place]) 08/19/2018  . Palliative care encounter   . Palliative care by specialist   . Acute respiratory failure with hypoxia ([place]) 08/18/2018  . Diabetes ([place]) 08/18/2018  . CKD (chronic kidney disease) 08/18/2018  . Amyloidosis ([place]) 08/18/2018  . Acute on chronic combined systolic and diastolic heart failure ([place]) 08/17/2018  . GERD (gastroesophageal reflux disease) 08/17/2018  . Elevated troponin 08/17/2018  . Glaucoma 08/17/2018  . Hyperbilirubinemia 08/17/2018  . CHF (congestive heart failure) ([place]) 07/18/2018  . Gait abnormality 05/03/2017  . Hypotension 04/14/2016  . Hypotension due to drugs   . Chronic anticoagulation   . TIA (transient ischemic attack) 04/12/2016  . UTI (lower urinary tract infection) 04/12/2016  . AKI (acute kidney injury) ([place]) 04/12/2016  . Atrial fibrillation ([place]) 09/12/2015  . Occipital infarction ([place])   . History of stroke 06/18/2015  . Homonymous hemianopsia   . Pacemaker 01/21/2015  . OSA (obstructive sleep apnea) 10/23/2014  . Dizziness 10/21/2014  . Complete heart block ([place]) 10/06/2014  . Benign essential HTN 10/04/2014  . Acute on chronic systolic CHF (congestive heart failure) ([place]) 10/04/2014  . Abnormal cardiovascular stress test 08/27/2014  . Pulmonary HTN ([place]) 08/16/2014  . DCM (dilated cardiomyopathy) ([place]) 08/16/2014  . Cough 07/13/2014  . Esophageal stricture 06/11/2014  . Nonspecific (abnormal) findings on radiological and other examination of gastrointestinal tract 05/29/2014  .  Dyspnea 04/29/2014  . Osteoarthritis of right shoulder region 06/26/2013  . DJD (degenerative joint disease) of hip 11/16/2011    Class: Present on Admission    Current Outpatient Medications:  .  Acetaminophen (TYLENOL) 325 MG CAPS, Take 650 mg by mouth daily as needed (for shoulder pain). , Disp: , Rfl:  .  apixaban (ELIQUIS) 2.5 MG TABS tablet, Take 1 tablet (2.5 mg total) by mouth 2 (two) times daily., Disp: 60 tablet, Rfl: 11 .  diclofenac sodium (VOLTAREN) 1 % GEL, Apply 2 g topically 3 (three) times daily as needed (pain, inflammation)., Disp: 100 g, Rfl: 0 .  glucose blood test strip, Use to test blood sugar twice daily (Accuchek Smartview), Disp: 100 each, Rfl: 0 .  latanoprost (XALATAN) 0.005 % ophthalmic solution, Place 1 drop into both eyes at bedtime. , Disp: , Rfl: 11 .  OXYGEN, Inhale 2 L into the lungs., Disp: , Rfl:  .  potassium chloride SA (K-DUR) 20 MEQ tablet, TAKE 1 TABLET BY MOUTH TWICE DAILY, Disp: 60 tablet, Rfl: 3 .  timolol (TIMOPTIC) 0.5 % ophthalmic solution, Place 1 drop into both eyes daily. , Disp: , Rfl: 11 .  bumetanide (BUMEX) 1 MG tablet, Take 2 tablets (2 mg total) by mouth 2 (two) times daily. Take additional dose on Sunday if weight 149lbs or greater., Disp: 180 tablet, Rfl: 3 .  cyproheptadine (PERIACTIN) 4 MG tablet, Take 1 tablet (4 mg total) by mouth at bedtime. (Patient not taking: Reported on 01/15/2019), Disp: 30 tablet, Rfl: 0 .  docusate sodium (COLACE) 100 MG capsule, Take 1 capsule (100 mg total) by mouth every 12 (twelve) hours. (Patient  not taking: Reported on 01/15/2019), Disp: 60 capsule, Rfl: 0 .  NON FORMULARY, impax 4 mg daily at bedtime, Disp: , Rfl:  Allergies  Allergen Reactions  . Hydrocodone Other (See Comments)    Took away all energy and made her stop eating food for short time  . Percocet [Oxycodone-Acetaminophen] Other (See Comments)    Too away all energy and made her stop eating food for short time  . Eggs Or Egg-Derived  Products Hives and Rash  . Mercurial Derivatives Itching and Rash  . Penicillins Hives and Rash    Has patient had a PCN reaction causing immediate rash, facial/tongue/throat swelling, SOB or lightheadedness with hypotension: Yes Has patient had a PCN reaction causing severe rash involving mucus membranes or skin necrosis: No Has patient had a PCN reaction that required hospitalization: No Has patient had a PCN reaction occurring within the last 10 years: Yes If all of the above answers are "NO", then may proceed with Cephalosporin use.  . Quinine Derivatives Hives and Rash     Social History   Socioeconomic History  . Marital status: Widowed    Spouse name: Not on file  . Number of children: 0  . Years of education: Not on file  . Highest education level: Doctorate  Occupational History  . Occupation: retired  Scientific laboratory technician  . Financial resource strain: Not hard at all  . Food insecurity:    Worry: Never true    Inability: Never true  . Transportation needs:    Medical: No    Non-medical: No  Tobacco Use  . Smoking status: Former Smoker    Packs/day: 1.00    Years: 45.00    Pack years: 45.00    Types: Cigarettes    Last attempt to quit: 07/24/1978    Years since quitting: 40.5  . Smokeless tobacco: Never Used  Substance and Sexual Activity  . Alcohol use: Yes    Alcohol/week: 7.0 standard drinks    Types: 7 Glasses of wine per week    Comment: every day  . Drug use: No  . Sexual activity: Not Currently  Lifestyle  . Physical activity:    Days per week: Not on file    Minutes per session: Not on file  . Stress: Not on file  Relationships  . Social connections:    Talks on phone: Not on file    Gets together: Not on file    Attends religious service: Not on file    Active member of club or organization: Not on file    Attends meetings of clubs or organizations: Not on file    Relationship status: Not on file  . Intimate partner violence:    Fear of current or  ex partner: Not on file    Emotionally abused: Not on file    Physically abused: Not on file    Forced sexual activity: Not on file  Other Topics Concern  . Not on file  Social History Narrative   Lives alone    Physical Exam Vitals signs reviewed.  Constitutional:      Appearance: She is normal weight.  HENT:     Head: Normocephalic.     Nose: Rhinorrhea present. No congestion.     Mouth/Throat:     Mouth: Mucous membranes are moist.  Neck:     Musculoskeletal: Normal range of motion.  Cardiovascular:     Rate and Rhythm: Normal rate and regular rhythm.     Pulses: Normal pulses.  Heart sounds: Normal heart sounds.  Pulmonary:     Effort: Pulmonary effort is normal.     Breath sounds: Normal breath sounds.  Abdominal:     General: Abdomen is flat.     Palpations: Abdomen is soft.  Musculoskeletal:     Right lower leg: Edema present.     Left lower leg: Edema present.  Skin:    General: Skin is warm and dry.     Capillary Refill: Capillary refill takes less than 2 seconds.  Neurological:     Mental Status: She is alert. Mental status is at baseline.    Arrived for home visit today with Kennyth Lose, who was in good spirits stating she felt good today. Kennyth Lose had taken all of her medications as prescribed over the week except for one PM dose. Vitals were obtained and as recorded. Patient stated she has had no trouble sleeping or getting around.  I noticed on exam patient's ankles were swollen along with tops of feet. Patient advised she has been eating moms meals and resting often. Patient denied increased shortness of breath but complained of swelling to ankles and feet.  Spoke with HF Clinic who advised to increase with 2 more Bumex and 1 more Potassium today. I advised patient I eould call back later this week to see how she is feeling and how her swelling was. Home visit complete.    Future Appointments  Date Time Provider [place]  02/21/2019 10:00 AM [facility]  PA/NP [facility] None     ACTION: Home visit completed Next visit planned for one week

## 2019-01-15 NOTE — Telephone Encounter (Signed)
Kim Mullins called to report pt has increased LE edema.  She states pt's wt is stable 128-131.  She states pt is on Bumex 4 mg BID and took extra 2 mg for 2 days last week, this helped with edema but it has returned now.    Discussed w/Kim Tamala Julian, NP she advised pt should take an extra 2 mg of bumex and an extra 20 meq of KCL for today and tomorrow.  She may take this extra bumex prn after that.    Kim Mullins is aware and agreeable, she will advise pt.

## 2019-01-22 ENCOUNTER — Other Ambulatory Visit (HOSPITAL_COMMUNITY): Payer: Self-pay

## 2019-01-22 NOTE — Progress Notes (Signed)
Paramedicine Encounter    Patient ID: Kim Mullins, female    DOB: Jun 17, 1930, 85 y.o.   MRN: [id number]   Patient Care Team: Lin Landsman, MD as PCP - General (Family Medicine) Larey Dresser, MD as PCP - Advanced Heart Failure (Cardiology) Jorge Ny, LCSW as Social Worker (Licensed Clinical Social Worker)  Patient Active Problem List   Diagnosis Date Noted  . Obesity (BMI 30-39.9) 10/20/2018  . Acute on chronic heart failure ([place]) 08/19/2018  . Palliative care encounter   . Palliative care by specialist   . Acute respiratory failure with hypoxia ([place]) 08/18/2018  . Diabetes ([place]) 08/18/2018  . CKD (chronic kidney disease) 08/18/2018  . Amyloidosis ([place]) 08/18/2018  . Acute on chronic combined systolic and diastolic heart failure ([place]) 08/17/2018  . GERD (gastroesophageal reflux disease) 08/17/2018  . Elevated troponin 08/17/2018  . Glaucoma 08/17/2018  . Hyperbilirubinemia 08/17/2018  . CHF (congestive heart failure) ([place]) 07/18/2018  . Gait abnormality 05/03/2017  . Hypotension 04/14/2016  . Hypotension due to drugs   . Chronic anticoagulation   . TIA (transient ischemic attack) 04/12/2016  . UTI (lower urinary tract infection) 04/12/2016  . AKI (acute kidney injury) ([place]) 04/12/2016  . Atrial fibrillation ([place]) 09/12/2015  . Occipital infarction ([place])   . History of stroke 06/18/2015  . Homonymous hemianopsia   . Pacemaker 01/21/2015  . OSA (obstructive sleep apnea) 10/23/2014  . Dizziness 10/21/2014  . Complete heart block ([place]) 10/06/2014  . Benign essential HTN 10/04/2014  . Acute on chronic systolic CHF (congestive heart failure) ([place]) 10/04/2014  . Abnormal cardiovascular stress test 08/27/2014  . Pulmonary HTN ([place]) 08/16/2014  . DCM (dilated cardiomyopathy) ([place]) 08/16/2014  . Cough 07/13/2014  . Esophageal stricture 06/11/2014  . Nonspecific (abnormal) findings on radiological and other examination of gastrointestinal tract 05/29/2014  .  Dyspnea 04/29/2014  . Osteoarthritis of right shoulder region 06/26/2013  . DJD (degenerative joint disease) of hip 11/16/2011    Class: Present on Admission    Current Outpatient Medications:  .  Acetaminophen (TYLENOL) 325 MG CAPS, Take 650 mg by mouth daily as needed (for shoulder pain). , Disp: , Rfl:  .  apixaban (ELIQUIS) 2.5 MG TABS tablet, Take 1 tablet (2.5 mg total) by mouth 2 (two) times daily., Disp: 60 tablet, Rfl: 11 .  bumetanide (BUMEX) 1 MG tablet, Take 2 tablets (2 mg total) by mouth 2 (two) times daily. Take additional dose on Sunday if weight 149lbs or greater., Disp: 180 tablet, Rfl: 3 .  cyproheptadine (PERIACTIN) 4 MG tablet, Take 1 tablet (4 mg total) by mouth at bedtime. (Patient not taking: Reported on 01/15/2019), Disp: 30 tablet, Rfl: 0 .  diclofenac sodium (VOLTAREN) 1 % GEL, Apply 2 g topically 3 (three) times daily as needed (pain, inflammation)., Disp: 100 g, Rfl: 0 .  docusate sodium (COLACE) 100 MG capsule, Take 1 capsule (100 mg total) by mouth every 12 (twelve) hours. (Patient not taking: Reported on 01/15/2019), Disp: 60 capsule, Rfl: 0 .  glucose blood test strip, Use to test blood sugar twice daily (Accuchek Smartview), Disp: 100 each, Rfl: 0 .  latanoprost (XALATAN) 0.005 % ophthalmic solution, Place 1 drop into both eyes at bedtime. , Disp: , Rfl: 11 .  NON FORMULARY, impax 4 mg daily at bedtime, Disp: , Rfl:  .  OXYGEN, Inhale 2 L into the lungs., Disp: , Rfl:  .  potassium chloride SA (K-DUR) 20 MEQ tablet, TAKE 1 TABLET BY MOUTH TWICE DAILY, Disp:  60 tablet, Rfl: 3 .  timolol (TIMOPTIC) 0.5 % ophthalmic solution, Place 1 drop into both eyes daily. , Disp: , Rfl: 11 Allergies  Allergen Reactions  . Hydrocodone Other (See Comments)    Took away all energy and made her stop eating food for short time  . Percocet [Oxycodone-Acetaminophen] Other (See Comments)    Too away all energy and made her stop eating food for short time  . Eggs Or Egg-Derived  Products Hives and Rash  . Mercurial Derivatives Itching and Rash  . Penicillins Hives and Rash    Has patient had a PCN reaction causing immediate rash, facial/tongue/throat swelling, SOB or lightheadedness with hypotension: Yes Has patient had a PCN reaction causing severe rash involving mucus membranes or skin necrosis: No Has patient had a PCN reaction that required hospitalization: No Has patient had a PCN reaction occurring within the last 10 years: Yes If all of the above answers are "NO", then may proceed with Cephalosporin use.  . Quinine Derivatives Hives and Rash     Social History   Socioeconomic History  . Marital status: Widowed    Spouse name: Not on file  . Number of children: 0  . Years of education: Not on file  . Highest education level: Doctorate  Occupational History  . Occupation: retired  Scientific laboratory technician  . Financial resource strain: Not hard at all  . Food insecurity:    Worry: Never true    Inability: Never true  . Transportation needs:    Medical: No    Non-medical: No  Tobacco Use  . Smoking status: Former Smoker    Packs/day: 1.00    Years: 45.00    Pack years: 45.00    Types: Cigarettes    Last attempt to quit: 07/24/1978    Years since quitting: 40.5  . Smokeless tobacco: Never Used  Substance and Sexual Activity  . Alcohol use: Yes    Alcohol/week: 7.0 standard drinks    Types: 7 Glasses of wine per week    Comment: every day  . Drug use: No  . Sexual activity: Not Currently  Lifestyle  . Physical activity:    Days per week: Not on file    Minutes per session: Not on file  . Stress: Not on file  Relationships  . Social connections:    Talks on phone: Not on file    Gets together: Not on file    Attends religious service: Not on file    Active member of club or organization: Not on file    Attends meetings of clubs or organizations: Not on file    Relationship status: Not on file  . Intimate partner violence:    Fear of current or  ex partner: Not on file    Emotionally abused: Not on file    Physically abused: Not on file    Forced sexual activity: Not on file  Other Topics Concern  . Not on file  Social History Narrative   Lives alone    Physical Exam Vitals signs reviewed.  Constitutional:      Appearance: She is normal weight.  HENT:     Head: Normocephalic.     Nose: Nose normal.     Mouth/Throat:     Mouth: Mucous membranes are moist.  Eyes:     Pupils: Pupils are equal, round, and reactive to light.  Neck:     Musculoskeletal: Normal range of motion and neck supple.  Cardiovascular:  Rate and Rhythm: Normal rate and regular rhythm.     Pulses: Normal pulses.     Heart sounds: Normal heart sounds.  Pulmonary:     Effort: Pulmonary effort is normal.  Abdominal:     General: Abdomen is flat.     Palpations: Abdomen is soft.  Musculoskeletal: Normal range of motion.        General: Swelling present.     Right lower leg: Edema present.     Left lower leg: Edema present.  Skin:    General: Skin is warm and dry.     Capillary Refill: Capillary refill takes less than 2 seconds.  Neurological:     General: No focal deficit present.     Mental Status: She is alert.  Psychiatric:        Mood and Affect: Mood normal.    Arrived for home visit with Kim Mullins today, who appeared alert and oriented. Kim Mullins was ambulating within her home appearing short of breath while walking. Patient was seated and she regained her normal breathing effort. Patient's leg edema was present but not as bad as last week's. Patient stated she has been feeling okay but when she's walking she feels short of breath so she has been resting a lot. Patient's vitals were obtained and are as recorded. Lung sounds were clear and equal on assessment. Medications were reviewed and verified and placed in pill box. Bumex was kept at 3mg  twice daily due to shortness of breath and edema. Medications recorded for refill. Patient  stated her bowel movement and urination have been normal. Patient states her appetite has been better. Patient reports her granddaughter coming into town next week. Patient agreed to home visit next Monday. Home visit completed.   CBG- 178 WT- 132.2lbs WT last- 130lbs   Medications to order -Eliquis    Future Appointments  Date Time Provider [place]  02/21/2019 10:00 AM [facility] PA/NP [facility] None     ACTION: Home visit completed Next visit planned for one week

## 2019-01-26 ENCOUNTER — Telehealth (HOSPITAL_COMMUNITY): Payer: Self-pay | Admitting: Licensed Clinical Social Worker

## 2019-01-26 NOTE — Telephone Encounter (Signed)
CSW reached out to pt to check in regarding food and medication status at this time. Pt reports she has everything she needs at this time.  Did express some concerns about her legs getting weak last night and her being unable to walk for about 15 minutes- states this happened a few months ago and she got it checked by a MD and they could not tell why it happened.  Patient has a life alert and a walker at home so if she were to fall she feels confident she could get help.  Also states her granddaughter is coming to stay with her for awhile starting this Sunday so she will have her to help out with things at home.  CSW encouraged pt to reach out with any concerns and will continue to follow and assist as needed  Jorge Ny, [place] Worker [facility] [phone]

## 2019-01-27 DIAGNOSIS — R0600 Dyspnea, unspecified: Secondary | ICD-10-CM | POA: Diagnosis not present

## 2019-01-27 DIAGNOSIS — I509 Heart failure, unspecified: Secondary | ICD-10-CM | POA: Diagnosis not present

## 2019-01-27 DIAGNOSIS — G4733 Obstructive sleep apnea (adult) (pediatric): Secondary | ICD-10-CM | POA: Diagnosis not present

## 2019-01-27 DIAGNOSIS — J45909 Unspecified asthma, uncomplicated: Secondary | ICD-10-CM | POA: Diagnosis not present

## 2019-01-27 DIAGNOSIS — J9601 Acute respiratory failure with hypoxia: Secondary | ICD-10-CM | POA: Diagnosis not present

## 2019-01-29 ENCOUNTER — Other Ambulatory Visit (HOSPITAL_COMMUNITY): Payer: Self-pay

## 2019-01-29 ENCOUNTER — Telehealth (HOSPITAL_COMMUNITY): Payer: Self-pay

## 2019-01-29 DIAGNOSIS — I10 Essential (primary) hypertension: Secondary | ICD-10-CM | POA: Diagnosis not present

## 2019-01-29 DIAGNOSIS — I509 Heart failure, unspecified: Secondary | ICD-10-CM | POA: Diagnosis not present

## 2019-01-29 DIAGNOSIS — R2681 Unsteadiness on feet: Secondary | ICD-10-CM | POA: Diagnosis not present

## 2019-01-29 DIAGNOSIS — E119 Type 2 diabetes mellitus without complications: Secondary | ICD-10-CM | POA: Diagnosis not present

## 2019-01-29 DIAGNOSIS — R001 Bradycardia, unspecified: Secondary | ICD-10-CM | POA: Diagnosis not present

## 2019-01-29 NOTE — Telephone Encounter (Signed)
Left message for Kim Mullins confirming our appointment today at noon and to remind her, her medications are ready for pick up at [facility].

## 2019-01-29 NOTE — Progress Notes (Signed)
Paramedicine Encounter    Patient ID: Anah Billard, female    DOB: 27-Oct-1929, 85 y.o.   MRN: [id number]   Patient Care Team: Lin Landsman, MD as PCP - General (Family Medicine) Larey Dresser, MD as PCP - Advanced Heart Failure (Cardiology) Jorge Ny, LCSW as Social Worker (Licensed Clinical Social Worker)  Patient Active Problem List   Diagnosis Date Noted  . Obesity (BMI 30-39.9) 10/20/2018  . Acute on chronic heart failure ([place]) 08/19/2018  . Palliative care encounter   . Palliative care by specialist   . Acute respiratory failure with hypoxia ([place]) 08/18/2018  . Diabetes ([place]) 08/18/2018  . CKD (chronic kidney disease) 08/18/2018  . Amyloidosis ([place]) 08/18/2018  . Acute on chronic combined systolic and diastolic heart failure ([place]) 08/17/2018  . GERD (gastroesophageal reflux disease) 08/17/2018  . Elevated troponin 08/17/2018  . Glaucoma 08/17/2018  . Hyperbilirubinemia 08/17/2018  . CHF (congestive heart failure) ([place]) 07/18/2018  . Gait abnormality 05/03/2017  . Hypotension 04/14/2016  . Hypotension due to drugs   . Chronic anticoagulation   . TIA (transient ischemic attack) 04/12/2016  . UTI (lower urinary tract infection) 04/12/2016  . AKI (acute kidney injury) ([place]) 04/12/2016  . Atrial fibrillation ([place]) 09/12/2015  . Occipital infarction ([place])   . History of stroke 06/18/2015  . Homonymous hemianopsia   . Pacemaker 01/21/2015  . OSA (obstructive sleep apnea) 10/23/2014  . Dizziness 10/21/2014  . Complete heart block ([place]) 10/06/2014  . Benign essential HTN 10/04/2014  . Acute on chronic systolic CHF (congestive heart failure) ([place]) 10/04/2014  . Abnormal cardiovascular stress test 08/27/2014  . Pulmonary HTN ([place]) 08/16/2014  . DCM (dilated cardiomyopathy) ([place]) 08/16/2014  . Cough 07/13/2014  . Esophageal stricture 06/11/2014  . Nonspecific (abnormal) findings on radiological and other examination of gastrointestinal tract 05/29/2014  .  Dyspnea 04/29/2014  . Osteoarthritis of right shoulder region 06/26/2013  . DJD (degenerative joint disease) of hip 11/16/2011    Class: Present on Admission    Current Outpatient Medications:  .  Acetaminophen (TYLENOL) 325 MG CAPS, Take 650 mg by mouth daily as needed (for shoulder pain). , Disp: , Rfl:  .  apixaban (ELIQUIS) 2.5 MG TABS tablet, Take 1 tablet (2.5 mg total) by mouth 2 (two) times daily., Disp: 60 tablet, Rfl: 11 .  bumetanide (BUMEX) 1 MG tablet, Take 2 tablets (2 mg total) by mouth 2 (two) times daily. Take additional dose on Sunday if weight 149lbs or greater., Disp: 180 tablet, Rfl: 3 .  diclofenac sodium (VOLTAREN) 1 % GEL, Apply 2 g topically 3 (three) times daily as needed (pain, inflammation)., Disp: 100 g, Rfl: 0 .  docusate sodium (COLACE) 100 MG capsule, Take 1 capsule (100 mg total) by mouth every 12 (twelve) hours., Disp: 60 capsule, Rfl: 0 .  glucose blood test strip, Use to test blood sugar twice daily (Accuchek Smartview), Disp: 100 each, Rfl: 0 .  latanoprost (XALATAN) 0.005 % ophthalmic solution, Place 1 drop into both eyes at bedtime. , Disp: , Rfl: 11 .  OXYGEN, Inhale 2 L into the lungs., Disp: , Rfl:  .  potassium chloride SA (K-DUR) 20 MEQ tablet, TAKE 1 TABLET BY MOUTH TWICE DAILY, Disp: 60 tablet, Rfl: 3 .  timolol (TIMOPTIC) 0.5 % ophthalmic solution, Place 1 drop into both eyes daily. , Disp: , Rfl: 11 .  cyproheptadine (PERIACTIN) 4 MG tablet, Take 1 tablet (4 mg total) by mouth at bedtime. (Patient not taking: Reported on 01/15/2019), Disp:  30 tablet, Rfl: 0 .  NON FORMULARY, impax 4 mg daily at bedtime, Disp: , Rfl:  Allergies  Allergen Reactions  . Hydrocodone Other (See Comments)    Took away all energy and made her stop eating food for short time  . Percocet [Oxycodone-Acetaminophen] Other (See Comments)    Too away all energy and made her stop eating food for short time  . Eggs Or Egg-Derived Products Hives and Rash  . Mercurial Derivatives  Itching and Rash  . Penicillins Hives and Rash    Has patient had a PCN reaction causing immediate rash, facial/tongue/throat swelling, SOB or lightheadedness with hypotension: Yes Has patient had a PCN reaction causing severe rash involving mucus membranes or skin necrosis: No Has patient had a PCN reaction that required hospitalization: No Has patient had a PCN reaction occurring within the last 10 years: Yes If all of the above answers are "NO", then may proceed with Cephalosporin use.  . Quinine Derivatives Hives and Rash     Social History   Socioeconomic History  . Marital status: Widowed    Spouse name: Not on file  . Number of children: 0  . Years of education: Not on file  . Highest education level: Doctorate  Occupational History  . Occupation: retired  Scientific laboratory technician  . Financial resource strain: Not hard at all  . Food insecurity:    Worry: Never true    Inability: Never true  . Transportation needs:    Medical: No    Non-medical: No  Tobacco Use  . Smoking status: Former Smoker    Packs/day: 1.00    Years: 45.00    Pack years: 45.00    Types: Cigarettes    Last attempt to quit: 07/24/1978    Years since quitting: 40.5  . Smokeless tobacco: Never Used  Substance and Sexual Activity  . Alcohol use: Yes    Alcohol/week: 7.0 standard drinks    Types: 7 Glasses of wine per week    Comment: every day  . Drug use: No  . Sexual activity: Not Currently  Lifestyle  . Physical activity:    Days per week: Not on file    Minutes per session: Not on file  . Stress: Not on file  Relationships  . Social connections:    Talks on phone: Not on file    Gets together: Not on file    Attends religious service: Not on file    Active member of club or organization: Not on file    Attends meetings of clubs or organizations: Not on file    Relationship status: Not on file  . Intimate partner violence:    Fear of current or ex partner: Not on file    Emotionally abused:  Not on file    Physically abused: Not on file    Forced sexual activity: Not on file  Other Topics Concern  . Not on file  Social History Narrative   Lives alone    Physical Exam Vitals signs reviewed.  HENT:     Head: Normocephalic.     Nose: Nose normal.     Mouth/Throat:     Mouth: Mucous membranes are moist.  Eyes:     Pupils: Pupils are equal, round, and reactive to light.  Neck:     Musculoskeletal: Normal range of motion.  Cardiovascular:     Rate and Rhythm: Normal rate and regular rhythm.     Pulses: Normal pulses.     Heart  sounds: Normal heart sounds.  Pulmonary:     Effort: Pulmonary effort is normal.     Breath sounds: Normal breath sounds.  Abdominal:     General: Abdomen is flat. There is no distension.  Musculoskeletal: Normal range of motion.     Right lower leg: No edema.     Left lower leg: No edema.  Skin:    General: Skin is warm and dry.     Capillary Refill: Capillary refill takes less than 2 seconds.  Neurological:     General: No focal deficit present.     Mental Status: She is alert. Mental status is at baseline.  Psychiatric:        Mood and Affect: Mood normal.     Arrived for home visit, Kennyth Lose was seated on her walker alert and oriented X4, warm and dry. Kennyth Lose stated she had no complaints on assessment. She mentioned to me she has had some issues with her right knee "giving" out over the weekend and she has a televisit with her PCP for same today. Patient denied falling but states she has had weakness with that leg. Patient had full ROM of extremities and has no issues with strength. No neurological deficits noted. Patient's vitals were obtained and are as recorded. Patient's medications were reviewed and verified. Pill box was filled appropriately. Patient's edema was minimal in both lower extremities, patient stated they have been "down" and reports no recent problems with same. Patient had no other questions or concerns. Home visit  complete. Patient agreed to visit in one week.   Medications Ordered: Potassium    Weight today- 136lbs Weight last week- 132lbs   CBG- 168 (after drinking two boost)     Future Appointments  Date Time Provider [place]  02/21/2019 10:00 AM [facility] PA/NP [facility] None     ACTION: Home visit completed Next visit planned for one week

## 2019-02-05 ENCOUNTER — Other Ambulatory Visit (HOSPITAL_COMMUNITY): Payer: Self-pay

## 2019-02-05 ENCOUNTER — Telehealth (HOSPITAL_COMMUNITY): Payer: Self-pay | Admitting: *Deleted

## 2019-02-05 MED ORDER — METOLAZONE 2.5 MG PO TABS: 2.5000 mg | ORAL_TABLET | ORAL | 3 refills | Status: DC

## 2019-02-05 NOTE — Telephone Encounter (Signed)
Nira Conn is out seeing pt today, she states pt's wt is up to 140 lb today, she was 132 lb last week.  She states pt has a lot of LE edema, lungs CTA and pt denies SOB.  She states pt increased her Bumex to 3 mg BID last week but it has not helped and LE edema has only gotten worse.  Pt is getting MOMS meals which is low sodium but does admit to drinking quite a bit of juice.  Discussed all with Dr Aundra Dubin, he would like pt to take Metolazone 2.5 mg today and Wed along with an extra 20 meq of KCL and have an in-person OV later this week.  Nira Conn is aware of med changes and relayed info to pt and her granddaughter who states she can p/u rx for Metolazone and will make sure pt gets it and can bring her to an appt on Thur 6/11 at 11 am.

## 2019-02-05 NOTE — Progress Notes (Signed)
Paramedicine Encounter    Patient ID: Kim Mullins, female    DOB: [birth year], 85 y.o.   MRN: [id number]   Patient Care Team: Lin Landsman, MD as PCP - General (Family Medicine) Larey Dresser, MD as PCP - Advanced Heart Failure (Cardiology) Jorge Ny, LCSW as Social Worker (Licensed Clinical Social Worker)  Patient Active Problem List   Diagnosis Date Noted  . Obesity (BMI 30-39.9) 10/20/2018  . Acute on chronic heart failure ([place]) 08/19/2018  . Palliative care encounter   . Palliative care by specialist   . Acute respiratory failure with hypoxia ([place]) 08/18/2018  . Diabetes ([place]) 08/18/2018  . CKD (chronic kidney disease) 08/18/2018  . Amyloidosis ([place]) 08/18/2018  . Acute on chronic combined systolic and diastolic heart failure ([place]) 08/17/2018  . GERD (gastroesophageal reflux disease) 08/17/2018  . Elevated troponin 08/17/2018  . Glaucoma 08/17/2018  . Hyperbilirubinemia 08/17/2018  . CHF (congestive heart failure) ([place]) 07/18/2018  . Gait abnormality 05/03/2017  . Hypotension 04/14/2016  . Hypotension due to drugs   . Chronic anticoagulation   . TIA (transient ischemic attack) 04/12/2016  . UTI (lower urinary tract infection) 04/12/2016  . AKI (acute kidney injury) ([place]) 04/12/2016  . Atrial fibrillation ([place]) 09/12/2015  . Occipital infarction ([place])   . History of stroke 06/18/2015  . Homonymous hemianopsia   . Pacemaker 01/21/2015  . OSA (obstructive sleep apnea) 10/23/2014  . Dizziness 10/21/2014  . Complete heart block ([place]) 10/06/2014  . Benign essential HTN 10/04/2014  . Acute on chronic systolic CHF (congestive heart failure) ([place]) 10/04/2014  . Abnormal cardiovascular stress test 08/27/2014  . Pulmonary HTN ([place]) 08/16/2014  . DCM (dilated cardiomyopathy) ([place]) 08/16/2014  . Cough 07/13/2014  . Esophageal stricture 06/11/2014  . Nonspecific (abnormal) findings on radiological and other examination of gastrointestinal tract 05/29/2014  .  Dyspnea 04/29/2014  . Osteoarthritis of right shoulder region 06/26/2013  . DJD (degenerative joint disease) of hip 11/16/2011    Class: Present on Admission    Current Outpatient Medications:  .  Acetaminophen (TYLENOL) 325 MG CAPS, Take 650 mg by mouth daily as needed (for shoulder pain). , Disp: , Rfl:  .  apixaban (ELIQUIS) 2.5 MG TABS tablet, Take 1 tablet (2.5 mg total) by mouth 2 (two) times daily., Disp: 60 tablet, Rfl: 11 .  bumetanide (BUMEX) 1 MG tablet, Take 2 tablets (2 mg total) by mouth 2 (two) times daily. Take additional dose on Sunday if weight 149lbs or greater., Disp: 180 tablet, Rfl: 3 .  glucose blood test strip, Use to test blood sugar twice daily (Accuchek Smartview), Disp: 100 each, Rfl: 0 .  latanoprost (XALATAN) 0.005 % ophthalmic solution, Place 1 drop into both eyes at bedtime. , Disp: , Rfl: 11 .  OXYGEN, Inhale 2 L into the lungs., Disp: , Rfl:  .  potassium chloride SA (K-DUR) 20 MEQ tablet, TAKE 1 TABLET BY MOUTH TWICE DAILY, Disp: 60 tablet, Rfl: 3 .  timolol (TIMOPTIC) 0.5 % ophthalmic solution, Place 1 drop into both eyes daily. , Disp: , Rfl: 11 .  cyproheptadine (PERIACTIN) 4 MG tablet, Take 1 tablet (4 mg total) by mouth at bedtime. (Patient not taking: Reported on 01/15/2019), Disp: 30 tablet, Rfl: 0 .  diclofenac sodium (VOLTAREN) 1 % GEL, Apply 2 g topically 3 (three) times daily as needed (pain, inflammation). (Patient not taking: Reported on 02/05/2019), Disp: 100 g, Rfl: 0 .  docusate sodium (COLACE) 100 MG capsule, Take 1 capsule (100 mg total) by  mouth every 12 (twelve) hours. (Patient not taking: Reported on 02/05/2019), Disp: 60 capsule, Rfl: 0 .  metolazone (ZAROXOLYN) 2.5 MG tablet, Take 1 tablet (2.5 mg total) by mouth as directed., Disp: 5 tablet, Rfl: 3 .  NON FORMULARY, impax 4 mg daily at bedtime, Disp: , Rfl:  Allergies  Allergen Reactions  . Hydrocodone Other (See Comments)    Took away all energy and made her stop eating food for short time   . Percocet [Oxycodone-Acetaminophen] Other (See Comments)    Too away all energy and made her stop eating food for short time  . Eggs Or Egg-Derived Products Hives and Rash  . Mercurial Derivatives Itching and Rash  . Penicillins Hives and Rash    Has patient had a PCN reaction causing immediate rash, facial/tongue/throat swelling, SOB or lightheadedness with hypotension: Yes Has patient had a PCN reaction causing severe rash involving mucus membranes or skin necrosis: No Has patient had a PCN reaction that required hospitalization: No Has patient had a PCN reaction occurring within the last 10 years: Yes If all of the above answers are "NO", then may proceed with Cephalosporin use.  . Quinine Derivatives Hives and Rash     Social History   Socioeconomic History  . Marital status: Widowed    Spouse name: Not on file  . Number of children: 0  . Years of education: Not on file  . Highest education level: Doctorate  Occupational History  . Occupation: retired  Scientific laboratory technician  . Financial resource strain: Not hard at all  . Food insecurity:    Worry: Never true    Inability: Never true  . Transportation needs:    Medical: No    Non-medical: No  Tobacco Use  . Smoking status: Former Smoker    Packs/day: 1.00    Years: 45.00    Pack years: 45.00    Types: Cigarettes    Last attempt to quit: 07/24/1978    Years since quitting: 40.5  . Smokeless tobacco: Never Used  Substance and Sexual Activity  . Alcohol use: Yes    Alcohol/week: 7.0 standard drinks    Types: 7 Glasses of wine per week    Comment: every day  . Drug use: No  . Sexual activity: Not Currently  Lifestyle  . Physical activity:    Days per week: Not on file    Minutes per session: Not on file  . Stress: Not on file  Relationships  . Social connections:    Talks on phone: Not on file    Gets together: Not on file    Attends religious service: Not on file    Active member of club or organization: Not on  file    Attends meetings of clubs or organizations: Not on file    Relationship status: Not on file  . Intimate partner violence:    Fear of current or ex partner: Not on file    Emotionally abused: Not on file    Physically abused: Not on file    Forced sexual activity: Not on file  Other Topics Concern  . Not on file  Social History Narrative   Lives alone    Physical Exam Vitals signs reviewed.  HENT:     Head: Normocephalic.     Nose: Nose normal.     Mouth/Throat:     Mouth: Mucous membranes are moist.  Eyes:     Pupils: Pupils are equal, round, and reactive to light.  Neck:  Musculoskeletal: Normal range of motion.  Cardiovascular:     Rate and Rhythm: Normal rate and regular rhythm.     Pulses: Normal pulses.  Pulmonary:     Effort: Pulmonary effort is normal.     Breath sounds: Normal breath sounds.  Abdominal:     General: Abdomen is flat.  Musculoskeletal: Normal range of motion.     Right lower leg: Edema present.     Left lower leg: Edema present.  Skin:    General: Skin is warm and dry.     Capillary Refill: Capillary refill takes less than 2 seconds.  Neurological:     Mental Status: She is alert. Mental status is at baseline.  Psychiatric:        Mood and Affect: Mood normal.      Arrived for home visit for [facility], she was seated in her walker upright alert and oriented. Kim Mullins stated she was feeling good today but her feet were swollen. Upon assessment Kim Mullins had +2 edema in both feet. Patient has been taking medications as prescribed only missing one dose Friday PM due to having an episode of vomiting and trouble resting. Patient stated she has been sitting up in her walker more than normal with her feet "dangling down". Patient also expressed her increase in appetite lately as well as her eating more often. She stated she eats Mom's Meals but she has to add "flavor" to them. I advised her adding salt defeats a "low sodium" diet. She also stated she  has been drinking a lot of juice, I advised her that was against her diet and she should be drinking water and low sugar/sodium fluids. She agreed and understood. I contacted HF clinic which provider suggested patient continuing 3mg  of Bumex twice daily as well as an added Metolazone today and Weds with an extra potassium with same. Patient and granddaughter agreed and understood same. Granddaughter states she would pick up medications and place in pill box for appropriate day/time. Patient was also instructed to pick up Colace to help improve painful constipation. Patient agreed and understood. Vitals obtained and reviewed. Medications verified and pill box.  Patient has appointment scheduled for Thursday at 11am at HF clinic. Patient understood. Home visit scheduled Monday next week.  CBG- 133 WT today- 140lbs    Future Appointments  Date Time Provider [place]  02/08/2019 11:00 AM Larey Dresser, MD [facility] None  02/21/2019  9:00 AM [facility] PA/NP [facility] None     ACTION: Home visit completed Next visit planned for one week.

## 2019-02-06 ENCOUNTER — Telehealth (HOSPITAL_COMMUNITY): Payer: Self-pay | Admitting: Licensed Clinical Social Worker

## 2019-02-06 NOTE — Telephone Encounter (Signed)
CSW called [facility] to get an update regarding patients application for Eliquis assistance.  Foundation reports that they are missing patients Medicare A&B number which has prevented them from moving forward- CSW provided with requested number.  [facility] to make a determination tomorrow and will update office by fax.  CSW will continue to follow and assist as needed  Jorge Ny, [facility] Desk#: [phone] Cell#: [phone]

## 2019-02-07 ENCOUNTER — Telehealth (HOSPITAL_COMMUNITY): Payer: Self-pay

## 2019-02-07 NOTE — Telephone Encounter (Signed)
COVID-19 pre-appointment screening questions:   Do you have a history of COVID-19 or a positive test result in the past 7-10 days? No  To the best of your knowledge, have you been in close contact with anyone with a confirmed diagnosis of COVID 19? No  Have you had any one or more of the following: Fever, chills, cough, shortness of breath (out of the normal for you) or any flu-like symptoms? No  Are you experiencing any of the following symptoms that is new or out of usual for you: No  . Ear, nose or throat discomfort . Sore throat . Headache . Muscle Pain . Diarrhea . Loss of taste or smell   Reviewed all the following with patient: . Use of hand sanitizer when entering the building . Everyone is required to wear a mask in the building, if you do not have a mask we are happy to provide you with one when you arrive . NO Visitor guidelines   If patient answers YES to any of questions they must change to a virtual visit and place note in comments about symptoms  

## 2019-02-08 ENCOUNTER — Ambulatory Visit (HOSPITAL_COMMUNITY)
Admission: RE | Admit: 2019-02-08 | Discharge: 2019-02-08 | Disposition: A | Discharge status: Home / Self Care | Payer: Medicare HMO | Source: Ambulatory Visit | Attending: Cardiology | Admitting: Cardiology

## 2019-02-08 ENCOUNTER — Encounter (HOSPITAL_COMMUNITY): Payer: Self-pay | Admitting: Cardiology

## 2019-02-08 ENCOUNTER — Other Ambulatory Visit: Payer: Self-pay

## 2019-02-08 VITALS — BP 96/56 | HR 74 | Wt 131.4 lb

## 2019-02-08 DIAGNOSIS — Z8673 Personal history of transient ischemic attack (TIA), and cerebral infarction without residual deficits: Secondary | ICD-10-CM | POA: Insufficient documentation

## 2019-02-08 DIAGNOSIS — E854 Organ-limited amyloidosis: Secondary | ICD-10-CM | POA: Insufficient documentation

## 2019-02-08 DIAGNOSIS — N184 Chronic kidney disease, stage 4 (severe): Secondary | ICD-10-CM | POA: Insufficient documentation

## 2019-02-08 DIAGNOSIS — Z885 Allergy status to narcotic agent status: Secondary | ICD-10-CM | POA: Diagnosis not present

## 2019-02-08 DIAGNOSIS — G4733 Obstructive sleep apnea (adult) (pediatric): Secondary | ICD-10-CM | POA: Diagnosis not present

## 2019-02-08 DIAGNOSIS — J9611 Chronic respiratory failure with hypoxia: Secondary | ICD-10-CM | POA: Insufficient documentation

## 2019-02-08 DIAGNOSIS — Z79899 Other long term (current) drug therapy: Secondary | ICD-10-CM | POA: Diagnosis not present

## 2019-02-08 DIAGNOSIS — I5022 Chronic systolic (congestive) heart failure: Secondary | ICD-10-CM

## 2019-02-08 DIAGNOSIS — N179 Acute kidney failure, unspecified: Secondary | ICD-10-CM | POA: Diagnosis not present

## 2019-02-08 DIAGNOSIS — Z950 Presence of cardiac pacemaker: Secondary | ICD-10-CM | POA: Insufficient documentation

## 2019-02-08 DIAGNOSIS — E1122 Type 2 diabetes mellitus with diabetic chronic kidney disease: Secondary | ICD-10-CM | POA: Insufficient documentation

## 2019-02-08 DIAGNOSIS — J45909 Unspecified asthma, uncomplicated: Secondary | ICD-10-CM | POA: Diagnosis not present

## 2019-02-08 DIAGNOSIS — Z7901 Long term (current) use of anticoagulants: Secondary | ICD-10-CM | POA: Insufficient documentation

## 2019-02-08 DIAGNOSIS — Z880 Allergy status to penicillin: Secondary | ICD-10-CM | POA: Diagnosis not present

## 2019-02-08 DIAGNOSIS — I480 Paroxysmal atrial fibrillation: Secondary | ICD-10-CM | POA: Insufficient documentation

## 2019-02-08 DIAGNOSIS — I428 Other cardiomyopathies: Secondary | ICD-10-CM | POA: Diagnosis not present

## 2019-02-08 DIAGNOSIS — Z87891 Personal history of nicotine dependence: Secondary | ICD-10-CM | POA: Diagnosis not present

## 2019-02-08 DIAGNOSIS — K219 Gastro-esophageal reflux disease without esophagitis: Secondary | ICD-10-CM | POA: Diagnosis not present

## 2019-02-08 DIAGNOSIS — Z9981 Dependence on supplemental oxygen: Secondary | ICD-10-CM | POA: Diagnosis not present

## 2019-02-08 LAB — BASIC METABOLIC PANEL
Anion gap: 14 (ref 5–15)
BUN: 49 mg/dL — ABNORMAL HIGH (ref 8–23)
CO2: 28 mmol/L (ref 22–32)
Calcium: 9 mg/dL (ref 8.9–10.3)
Chloride: 92 mmol/L — ABNORMAL LOW (ref 98–111)
Creatinine, Ser: 1.35 mg/dL — ABNORMAL HIGH (ref 0.44–1.00)
GFR calc Af Amer: 41 mL/min — ABNORMAL LOW (ref >60)
GFR calc non Af Amer: 35 mL/min — ABNORMAL LOW (ref >60)
Glucose, Bld: 168 mg/dL — ABNORMAL HIGH (ref 70–99)
Potassium: 3 mmol/L — ABNORMAL LOW (ref 3.5–5.1)
Sodium: 134 mmol/L — ABNORMAL LOW (ref 135–145)

## 2019-02-08 LAB — CBC
HCT: 39.9 % (ref 36.0–46.0)
Hemoglobin: 12.7 g/dL (ref 12.0–15.0)
MCH: 29.3 pg (ref 26.0–34.0)
MCHC: 31.8 g/dL (ref 30.0–36.0)
MCV: 91.9 fL (ref 80.0–100.0)
Platelets: 140 10*3/uL — ABNORMAL LOW (ref 150–400)
RBC: 4.34 MIL/uL (ref 3.87–5.11)
RDW: 15.6 % — ABNORMAL HIGH (ref 11.5–15.5)
WBC: 2.9 10*3/uL — ABNORMAL LOW (ref 4.0–10.5)
nRBC: 0.7 % — ABNORMAL HIGH (ref 0.0–0.2)

## 2019-02-08 LAB — PROTEIN / CREATININE RATIO, URINE
Creatinine, Urine: 28.63 mg/dL
Total Protein, Urine: <6 mg/dL

## 2019-02-08 MED ORDER — BUMETANIDE 2 MG PO TABS: 4.0000 mg | ORAL_TABLET | Freq: Two times a day (BID) | ORAL | 6 refills | Status: DC

## 2019-02-08 MED ORDER — POTASSIUM CHLORIDE CRYS ER 20 MEQ PO TBCR: EXTENDED_RELEASE_TABLET | ORAL | 5 refills | Status: DC

## 2019-02-08 NOTE — Progress Notes (Signed)
ID:  Kim Mullins, DOB 03-27-1930, MRN [id number]  Location: Home  Provider location: [facility] Type of Visit: Established patient   PCP:  Lin Landsman, MD  Cardiologist:   Dr Aundra Dubin   History of Present Illness: Kim Mullins is a 85 y.o. female with a history of chronic systolic CHF/nonischemic cardiomyopathy, prior complete heart block, OSA, and paroxysmal atrial fibrillation who presents for followup of CHF. She was diagnosed with a cardiomyopathy back in 2015. She had right and left heart cath in 12/15, showing no significant coronary disease. EF was in the 30-35% range. She was admitted in 2/16 with symptomatic bradycardia/complete heart block and had Medtronic CRT-P placed. In 4/16, EF was back up to 50-55%. However, repeat echo in 6/17 showed EF down to 15-20% with RV dysfunction.   She was admitted in 8/17 with suspected TIA =>dysarthria and blurred vision. She had been on Eliquis 2.5 mg bid, this was increased to 5 mg bid. Lisinopril was stopped and Coreg was decreased due to soft BP. Echo was done that admission, showing improvement in EF to 40-45%.  Echo in 5/19 showed EF 40%, moderate LVH, moderate diastolic dysfunction, mild to moderately decreased RV systolic function, dilated IVC.   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   Admitted 11/19 - 07/26/18 with worsening renal failure with Cr up to 3.7 from baseline 1.5 - 1.7. RHC demonstrated R>L failure as below. Transiently on milrinone, but weaned off prior to discharge. Cr remained elevated but stable at 2.7 on day of discharge. Echo in 11/19 showed EF 35-40%, severe TR, mildly dilated RV.   Patient has been steadily worsening in terms of her symptoms.  She is now on oxygen.  She is short of breath walking around her house.  Occasional orthopnea.  She has nausea at times and poor appetite.  Weight is up 2 lbs.  She is short of breath with most  activities but is able to get her ADLs done.  Her grand-daughter now lives with her. She took metolazone on Monday and Wednesday this week due to weight gain. Weight is trending back down.    ECG (personally reviewed): NSR, BiV pacing  Labs (2/20): K 3.7, creatinine 1.72, hgb 14, BNP 1425  PMH: 1. Type II diabetes: Diet-controlled.  2. GERD: s/p esophageal dilatation.  3. Chronic systolic CHF: 1st noted in 2015. Nonischemic cardiomyopathy.  - Echo (11/15) with EF 40-45%.  - LHC/RHC (12/15): Normal coronaries, EF 30-35%, mean RA 9, PA 44/16 mean 31, unable to obtain PCWP, CI 2.5.  - Developed CHB and had Medtronic CRT-P placed in 2/16. - Echo (4/16) with EF 50-55%.  - Echo (6/17): EF 15-20%, moderate LVh, restrictive diastolic function, mild MR, RV moderately dilated and moderately decreased in function, moderate TR, PASP 46 mmHg.  - Echo (8/17): EF 40-45%, diffuse hypokinesis, mild MR.  - Echo (5/19): EF 40%, moderate LVH, moderate diastolic dysfunction, mild to moderately decreased RV systolic function, dilated IVC. - RHC (11/19): mean RA 16, PA 41/19, mean PCWP 22, CI 2.74, PVR 1.4 WU.  - Echo (11/19): EF 35-40%, moderate LVH, mild MR, severe TR, mildly dilated RV.  4. Complete heart block: Medtronic CRT-P in 2/16.  5. OSA: Uses CPAP 6. Asthma 7. Atrial fibrillation: Paroxysmal.  8. H/o CVA.  Possible TIA in 8/17.    - Carotid dopplers (8/17) with 1-39% BICA stenosis.  9. 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  10. 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   FH: No cardiac problems that she knows of.   Current Outpatient Medications  Medication Sig Dispense Refill   Acetaminophen (TYLENOL) 325 MG CAPS Take 650 mg by mouth daily as needed (for shoulder pain).      apixaban (ELIQUIS) 2.5 MG TABS tablet Take 1 tablet (2.5 mg total) by mouth 2 (two) times  daily. 60 tablet 11   bumetanide (BUMEX) 2 MG tablet Take 2 tablets (4 mg total) by mouth 2 (two) times daily. Take additional dose on Sunday if weight 149lbs or greater. 60 tablet 6   diclofenac sodium (VOLTAREN) 1 % GEL Apply 2 g topically 3 (three) times daily as needed (pain, inflammation). 100 g 0   docusate sodium (COLACE) 100 MG capsule Take 1 capsule (100 mg total) by mouth every 12 (twelve) hours. 60 capsule 0   glucose blood test strip Use to test blood sugar twice daily (Accuchek Smartview) 100 each 0   latanoprost (XALATAN) 0.005 % ophthalmic solution Place 1 drop into both eyes at bedtime.   11   metolazone (ZAROXOLYN) 2.5 MG tablet Take 1 tablet (2.5 mg total) by mouth as directed. 5 tablet 3   NON FORMULARY impax 4 mg daily at bedtime     OXYGEN Inhale 2 L into the lungs.     potassium chloride SA (K-DUR) 20 MEQ tablet Take 2 tablets (40 mEq total) by mouth every morning AND 1 tablet (20 mEq total) every evening. 90 tablet 5   timolol (TIMOPTIC) 0.5 % ophthalmic solution Place 1 drop into both eyes daily.   11   No current facility-administered medications for this encounter.     Allergies:   Hydrocodone, Percocet [oxycodone-acetaminophen], Eggs or egg-derived products, Mercurial derivatives, Penicillins, and Quinine derivatives   ROS:  Please see the history of present illness.   All other systems are personally reviewed and negative.   Exam:   BP (!) 96/56    Pulse 74    Wt 59.6 kg (131 lb 6.4 oz)    SpO2 96%    BMI 23.28 kg/m  General: NAD Neck: JVP 12-14 cm, no thyromegaly or thyroid nodule.  Lungs: Clear to auscultation bilaterally with normal respiratory effort. CV: Nondisplaced PMI.  Heart regular S1/S2, no S3/S4, 1/6 HSM LLSB.  2+ edema 1/2 to knees bilaterally.  No carotid bruit.  Normal pedal pulses.  Abdomen: Soft, nontender, no hepatosplenomegaly, no distention.  Skin: Intact without lesions or rashes.  Neurologic: Alert and oriented x 3.  Psych:  Normal affect. Extremities: No clubbing or cyanosis.  HEENT: Normal.   Recent Labs: 08/20/2018: ALT 13 10/09/2018: B Natriuretic Peptide 1,424.7 02/08/2019: BUN 49; Creatinine, Ser 1.35; Hemoglobin 12.7; Platelets 140; Potassium 3.0; Sodium 134  Personally reviewed   Wt Readings from Last 3 Encounters:  02/08/19 59.6 kg (131 lb 6.4 oz)  02/05/19 63.5 kg (140 lb)  01/22/19 60 kg (132 lb 3.2 oz)    Vitals:   02/08/19 1110  BP: (!) 96/56  Pulse: 74  SpO2: 96%    ASSESSMENT AND PLAN:  1. Chronic systolic CHF: Nonischemic cardiomyopathy.  MDT CRT-P device.  ECHO 07/21/2018 with EF 35-40%, severe TR.  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  Patient has had progressive worsening of dyspnea and volume overload. This is complicated by CKD stage IV. She  took metolazone twice this week already. She remains volume overloaded.  NYHA class IIIb-IV.  - Increase bumetanide from 3 mg bid to 4 mg bid.  BMET today and again in 1 week.   -No ACEI/ARB/spironolactone/ARNI/beta blocker with CKD stage IV and marginal BP.  2. 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  I think that with advanced age and advanced HF, she is unlikely to benefit significantly from tafamidis. 3. Atrial fibrillation: Paroxysmal.  She is in NSR today.  -Eliquis2.5 g BID given her age and elevated creatinine. No bleeding problems.  4. CKD Stage IV: Creatinine baseline 1.7 - 2.  Check BMET today.  5. CHB: Medtronic CRT-P.   6. Chronic Hypoxic Respiratory Failure: Continue  2 liter oxygen continuously.   Poor prognosis with advanced HF in setting of nonischemic cardiomyopathy/cardiac amyloidosis.   Signed, [name], MD  02/08/2019  [place] [address] [facility] and [place] [address] [phone] (office) [phone] (fax)

## 2019-02-08 NOTE — Patient Instructions (Addendum)
INCREASE BUMEX to 4mg  (2 tabs) twice a day  INCREASE Potassium 83meq (2 tabs) in the morning AND 40meq (1 tab) in the evening.  Labs today We will only contact you if something comes back abnormal or we need to make some changes. Otherwise no news is good news!  REPEAT labs in 1 week. June 18th, 2020 at 10am   We will f/u with patient assistance regarding Eliquis  Your physician recommends that you schedule a follow-up appointment in: 1 month with Dr Aundra Dubin. You will get a call to schedule this appointment.

## 2019-02-08 NOTE — Progress Notes (Signed)
Labs added per Dr Aundra Dubin for [facility]. Once resulted, will be faxed to [facility] [phone]

## 2019-02-09 ENCOUNTER — Telehealth (HOSPITAL_COMMUNITY): Payer: Self-pay

## 2019-02-09 LAB — MICROALBUMIN / CREATININE URINE RATIO
Creatinine, Urine: 25.2 mg/dL
Microalb Creat Ratio: 23 mg/g creat (ref 0–29)
Microalb, Ur: 5.9 ug/mL — ABNORMAL HIGH

## 2019-02-09 LAB — PTH, INTACT AND CALCIUM
Calcium, Total (PTH): 9 mg/dL (ref 8.7–10.3)
PTH: 15 pg/mL (ref 15–65)

## 2019-02-09 MED ORDER — POTASSIUM CHLORIDE CRYS ER 20 MEQ PO TBCR: 40.0000 meq | EXTENDED_RELEASE_TABLET | Freq: Two times a day (BID) | ORAL | 5 refills | Status: DC

## 2019-02-09 NOTE — Telephone Encounter (Signed)
Attempted to call patient again, no answer, vm full

## 2019-02-09 NOTE — Telephone Encounter (Signed)
Attempted to call patient, no answer and mail box full. Will make another attempt

## 2019-02-09 NOTE — Telephone Encounter (Signed)
Pt made aware of lab work and to increase potassium to 45m BID. Pt reports understanding. Has an appt for labs 6/18

## 2019-02-09 NOTE — Telephone Encounter (Signed)
-----   Message from Larey Dresser, MD sent at 02/08/2019  4:53 PM EDT ----- She has been taking metolazone.  Would increase KCl to 40 bid with repeat BMET 1 week.

## 2019-02-12 ENCOUNTER — Other Ambulatory Visit (HOSPITAL_COMMUNITY): Payer: Self-pay

## 2019-02-12 ENCOUNTER — Telehealth (HOSPITAL_COMMUNITY): Payer: Self-pay

## 2019-02-12 NOTE — Progress Notes (Signed)
Paramedicine Encounter    Patient ID: Kim Mullins, female    DOB: 15-Jul-1930, 85 y.o.   MRN: [id number]   Patient Care Team: Lin Landsman, MD as PCP - General (Family Medicine) Larey Dresser, MD as PCP - Advanced Heart Failure (Cardiology) Jorge Ny, LCSW as Social Worker (Licensed Clinical Social Worker)  Patient Active Problem List   Diagnosis Date Noted  . Obesity (BMI 30-39.9) 10/20/2018  . Acute on chronic heart failure ([place]) 08/19/2018  . Palliative care encounter   . Palliative care by specialist   . Acute respiratory failure with hypoxia ([place]) 08/18/2018  . Diabetes ([place]) 08/18/2018  . CKD (chronic kidney disease) 08/18/2018  . Amyloidosis ([place]) 08/18/2018  . Acute on chronic combined systolic and diastolic heart failure ([place]) 08/17/2018  . GERD (gastroesophageal reflux disease) 08/17/2018  . Elevated troponin 08/17/2018  . Glaucoma 08/17/2018  . Hyperbilirubinemia 08/17/2018  . CHF (congestive heart failure) ([place]) 07/18/2018  . Gait abnormality 05/03/2017  . Hypotension 04/14/2016  . Hypotension due to drugs   . Chronic anticoagulation   . TIA (transient ischemic attack) 04/12/2016  . UTI (lower urinary tract infection) 04/12/2016  . AKI (acute kidney injury) ([place]) 04/12/2016  . Atrial fibrillation ([place]) 09/12/2015  . Occipital infarction ([place])   . History of stroke 06/18/2015  . Homonymous hemianopsia   . Pacemaker 01/21/2015  . OSA (obstructive sleep apnea) 10/23/2014  . Dizziness 10/21/2014  . Complete heart block ([place]) 10/06/2014  . Benign essential HTN 10/04/2014  . Acute on chronic systolic CHF (congestive heart failure) ([place]) 10/04/2014  . Abnormal cardiovascular stress test 08/27/2014  . Pulmonary HTN ([place]) 08/16/2014  . DCM (dilated cardiomyopathy) ([place]) 08/16/2014  . Cough 07/13/2014  . Esophageal stricture 06/11/2014  . Nonspecific (abnormal) findings on radiological and other examination of gastrointestinal tract 05/29/2014  .  Dyspnea 04/29/2014  . Osteoarthritis of right shoulder region 06/26/2013  . DJD (degenerative joint disease) of hip 11/16/2011    Class: Present on Admission    Current Outpatient Medications:  .  apixaban (ELIQUIS) 2.5 MG TABS tablet, Take 1 tablet (2.5 mg total) by mouth 2 (two) times daily., Disp: 60 tablet, Rfl: 11 .  bumetanide (BUMEX) 2 MG tablet, Take 2 tablets (4 mg total) by mouth 2 (two) times daily. Take additional dose on Sunday if weight 149lbs or greater., Disp: 60 tablet, Rfl: 6 .  diclofenac sodium (VOLTAREN) 1 % GEL, Apply 2 g topically 3 (three) times daily as needed (pain, inflammation)., Disp: 100 g, Rfl: 0 .  glucose blood test strip, Use to test blood sugar twice daily (Accuchek Smartview), Disp: 100 each, Rfl: 0 .  latanoprost (XALATAN) 0.005 % ophthalmic solution, Place 1 drop into both eyes at bedtime. , Disp: , Rfl: 11 .  OXYGEN, Inhale 2 L into the lungs., Disp: , Rfl:  .  potassium chloride SA (K-DUR) 20 MEQ tablet, Take 2 tablets (40 mEq total) by mouth 2 (two) times daily., Disp: 120 tablet, Rfl: 5 .  timolol (TIMOPTIC) 0.5 % ophthalmic solution, Place 1 drop into both eyes daily. , Disp: , Rfl: 11 .  Acetaminophen (TYLENOL) 325 MG CAPS, Take 650 mg by mouth daily as needed (for shoulder pain). , Disp: , Rfl:  .  docusate sodium (COLACE) 100 MG capsule, Take 1 capsule (100 mg total) by mouth every 12 (twelve) hours. (Patient not taking: Reported on 02/12/2019), Disp: 60 capsule, Rfl: 0 .  metolazone (ZAROXOLYN) 2.5 MG tablet, Take 1 tablet (2.5 mg total)  by mouth as directed. (Patient not taking: Reported on 02/12/2019), Disp: 5 tablet, Rfl: 3 .  NON FORMULARY, impax 4 mg daily at bedtime, Disp: , Rfl:  Allergies  Allergen Reactions  . Hydrocodone Other (See Comments)    Took away all energy and made her stop eating food for short time  . Percocet [Oxycodone-Acetaminophen] Other (See Comments)    Too away all energy and made her stop eating food for short time  .  Eggs Or Egg-Derived Products Hives and Rash  . Mercurial Derivatives Itching and Rash  . Penicillins Hives and Rash    Has patient had a PCN reaction causing immediate rash, facial/tongue/throat swelling, SOB or lightheadedness with hypotension: Yes Has patient had a PCN reaction causing severe rash involving mucus membranes or skin necrosis: No Has patient had a PCN reaction that required hospitalization: No Has patient had a PCN reaction occurring within the last 10 years: Yes If all of the above answers are "NO", then may proceed with Cephalosporin use.  . Quinine Derivatives Hives and Rash     Social History   Socioeconomic History  . Marital status: Widowed    Spouse name: Not on file  . Number of children: 0  . Years of education: Not on file  . Highest education level: Doctorate  Occupational History  . Occupation: retired  Scientific laboratory technician  . Financial resource strain: Not hard at all  . Food insecurity    Worry: Never true    Inability: Never true  . Transportation needs    Medical: No    Non-medical: No  Tobacco Use  . Smoking status: Former Smoker    Packs/day: 1.00    Years: 45.00    Pack years: 45.00    Types: Cigarettes    Quit date: 07/24/1978    Years since quitting: 40.5  . Smokeless tobacco: Never Used  Substance and Sexual Activity  . Alcohol use: Yes    Alcohol/week: 7.0 standard drinks    Types: 7 Glasses of wine per week    Comment: every day  . Drug use: No  . Sexual activity: Not Currently  Lifestyle  . Physical activity    Days per week: Not on file    Minutes per session: Not on file  . Stress: Not on file  Relationships  . Social Herbalist on phone: Not on file    Gets together: Not on file    Attends religious service: Not on file    Active member of club or organization: Not on file    Attends meetings of clubs or organizations: Not on file    Relationship status: Not on file  . Intimate partner violence    Fear of  current or ex partner: Not on file    Emotionally abused: Not on file    Physically abused: Not on file    Forced sexual activity: Not on file  Other Topics Concern  . Not on file  Social History Narrative   Lives alone    Physical Exam Vitals signs reviewed.  HENT:     Head: Normocephalic.     Nose: Nose normal.     Mouth/Throat:     Mouth: Mucous membranes are moist.  Eyes:     Pupils: Pupils are equal, round, and reactive to light.  Neck:     Musculoskeletal: Normal range of motion.  Cardiovascular:     Rate and Rhythm: Normal rate and regular rhythm.  Pulses: Normal pulses.     Heart sounds: Normal heart sounds.  Pulmonary:     Effort: Pulmonary effort is normal.     Breath sounds: Normal breath sounds.  Musculoskeletal: Normal range of motion.     Right lower leg: No edema.     Left lower leg: No edema.  Skin:    General: Skin is warm and dry.     Capillary Refill: Capillary refill takes less than 2 seconds.  Neurological:     General: No focal deficit present.     Mental Status: She is alert. Mental status is at baseline.  Psychiatric:        Mood and Affect: Mood normal.     Arrived on scene for Kim Mullins's home visit, Kim Mullins was seated on her walker alert and oriented with no complaints today. Kim Mullins stated she has had no difficulty breathing over the last week, swelling in ankles and feet have decreased with medications changes. Patient states she has had no changes in appetite. Patient did state she is continuing to have difficulty with constipation. Patient was encouraged to try Metamucil daily, patient and granddaughter agreed to same. Vitals were obtained and reviewed. Medications were reviewed and verified. Pill box filled accordingly. Kim Mullins agreed to visit in one week.   cbg- [id number]    Future Appointments  Date Time Provider [place]  02/15/2019 11:30 AM [facility] None     ACTION: Home visit completed Next visit planned for one  week

## 2019-02-12 NOTE — Telephone Encounter (Signed)
Spoke to Kim Mullins who confirmed appointment this morning at 10. Also notified her I called in her test strips and they will be sent to [facility] on [address] today.

## 2019-02-14 DIAGNOSIS — N184 Chronic kidney disease, stage 4 (severe): Secondary | ICD-10-CM | POA: Diagnosis not present

## 2019-02-14 DIAGNOSIS — I5020 Unspecified systolic (congestive) heart failure: Secondary | ICD-10-CM | POA: Diagnosis not present

## 2019-02-14 DIAGNOSIS — N179 Acute kidney failure, unspecified: Secondary | ICD-10-CM | POA: Diagnosis not present

## 2019-02-15 ENCOUNTER — Other Ambulatory Visit (HOSPITAL_COMMUNITY): Payer: Medicare HMO

## 2019-02-17 DIAGNOSIS — R69 Illness, unspecified: Secondary | ICD-10-CM | POA: Diagnosis not present

## 2019-02-19 ENCOUNTER — Other Ambulatory Visit (HOSPITAL_COMMUNITY): Payer: Self-pay

## 2019-02-19 ENCOUNTER — Other Ambulatory Visit: Payer: Self-pay

## 2019-02-19 ENCOUNTER — Ambulatory Visit (HOSPITAL_COMMUNITY)
Admission: RE | Admit: 2019-02-19 | Discharge: 2019-02-19 | Disposition: A | Discharge status: Home / Self Care | Payer: Medicare HMO | Source: Ambulatory Visit | Attending: Cardiology | Admitting: Cardiology

## 2019-02-19 DIAGNOSIS — I5022 Chronic systolic (congestive) heart failure: Secondary | ICD-10-CM | POA: Insufficient documentation

## 2019-02-19 LAB — BASIC METABOLIC PANEL
Anion gap: 9 (ref 5–15)
BUN: 27 mg/dL — ABNORMAL HIGH (ref 8–23)
CO2: 24 mmol/L (ref 22–32)
Calcium: 8.7 mg/dL — ABNORMAL LOW (ref 8.9–10.3)
Chloride: 103 mmol/L (ref 98–111)
Creatinine, Ser: 1.5 mg/dL — ABNORMAL HIGH (ref 0.44–1.00)
GFR calc Af Amer: 36 mL/min — ABNORMAL LOW (ref >60)
GFR calc non Af Amer: 31 mL/min — ABNORMAL LOW (ref >60)
Glucose, Bld: 167 mg/dL — ABNORMAL HIGH (ref 70–99)
Potassium: 4.3 mmol/L (ref 3.5–5.1)
Sodium: 136 mmol/L (ref 135–145)

## 2019-02-19 NOTE — Progress Notes (Signed)
Paramedicine Encounter    Patient ID: Kim Mullins, female    DOB: 27-Mar-1930, 85 y.o.   MRN: [id number]   Patient Care Team: Lin Landsman, MD as PCP - General (Family Medicine) Larey Dresser, MD as PCP - Advanced Heart Failure (Cardiology) Jorge Ny, LCSW as Social Worker (Licensed Clinical Social Worker)  Patient Active Problem List   Diagnosis Date Noted  . Obesity (BMI 30-39.9) 10/20/2018  . Acute on chronic heart failure ([place]) 08/19/2018  . Palliative care encounter   . Palliative care by specialist   . Acute respiratory failure with hypoxia ([place]) 08/18/2018  . Diabetes ([place]) 08/18/2018  . CKD (chronic kidney disease) 08/18/2018  . Amyloidosis ([place]) 08/18/2018  . Acute on chronic combined systolic and diastolic heart failure ([place]) 08/17/2018  . GERD (gastroesophageal reflux disease) 08/17/2018  . Elevated troponin 08/17/2018  . Glaucoma 08/17/2018  . Hyperbilirubinemia 08/17/2018  . CHF (congestive heart failure) ([place]) 07/18/2018  . Gait abnormality 05/03/2017  . Hypotension 04/14/2016  . Hypotension due to drugs   . Chronic anticoagulation   . TIA (transient ischemic attack) 04/12/2016  . UTI (lower urinary tract infection) 04/12/2016  . AKI (acute kidney injury) ([place]) 04/12/2016  . Atrial fibrillation ([place]) 09/12/2015  . Occipital infarction ([place])   . History of stroke 06/18/2015  . Homonymous hemianopsia   . Pacemaker 01/21/2015  . OSA (obstructive sleep apnea) 10/23/2014  . Dizziness 10/21/2014  . Complete heart block ([place]) 10/06/2014  . Benign essential HTN 10/04/2014  . Acute on chronic systolic CHF (congestive heart failure) ([place]) 10/04/2014  . Abnormal cardiovascular stress test 08/27/2014  . Pulmonary HTN ([place]) 08/16/2014  . DCM (dilated cardiomyopathy) ([place]) 08/16/2014  . Cough 07/13/2014  . Esophageal stricture 06/11/2014  . Nonspecific (abnormal) findings on radiological and other examination of gastrointestinal tract 05/29/2014  .  Dyspnea 04/29/2014  . Osteoarthritis of right shoulder region 06/26/2013  . DJD (degenerative joint disease) of hip 11/16/2011    Class: Present on Admission    Current Outpatient Medications:  .  Acetaminophen (TYLENOL) 325 MG CAPS, Take 650 mg by mouth daily as needed (for shoulder pain). , Disp: , Rfl:  .  apixaban (ELIQUIS) 2.5 MG TABS tablet, Take 1 tablet (2.5 mg total) by mouth 2 (two) times daily., Disp: 60 tablet, Rfl: 11 .  bumetanide (BUMEX) 2 MG tablet, Take 2 tablets (4 mg total) by mouth 2 (two) times daily. Take additional dose on Sunday if weight 149lbs or greater., Disp: 60 tablet, Rfl: 6 .  diclofenac sodium (VOLTAREN) 1 % GEL, Apply 2 g topically 3 (three) times daily as needed (pain, inflammation)., Disp: 100 g, Rfl: 0 .  glucose blood test strip, Use to test blood sugar twice daily (Accuchek Smartview), Disp: 100 each, Rfl: 0 .  latanoprost (XALATAN) 0.005 % ophthalmic solution, Place 1 drop into both eyes at bedtime. , Disp: , Rfl: 11 .  OXYGEN, Inhale 2 L into the lungs., Disp: , Rfl:  .  potassium chloride SA (K-DUR) 20 MEQ tablet, Take 2 tablets (40 mEq total) by mouth 2 (two) times daily., Disp: 120 tablet, Rfl: 5 .  timolol (TIMOPTIC) 0.5 % ophthalmic solution, Place 1 drop into both eyes daily. , Disp: , Rfl: 11 .  docusate sodium (COLACE) 100 MG capsule, Take 1 capsule (100 mg total) by mouth every 12 (twelve) hours. (Patient not taking: Reported on 02/12/2019), Disp: 60 capsule, Rfl: 0 .  metolazone (ZAROXOLYN) 2.5 MG tablet, Take 1 tablet (2.5 mg total)  by mouth as directed. (Patient not taking: Reported on 02/12/2019), Disp: 5 tablet, Rfl: 3 .  NON FORMULARY, impax 4 mg daily at bedtime, Disp: , Rfl:  Allergies  Allergen Reactions  . Hydrocodone Other (See Comments)    Took away all energy and made her stop eating food for short time  . Percocet [Oxycodone-Acetaminophen] Other (See Comments)    Too away all energy and made her stop eating food for short time  .  Eggs Or Egg-Derived Products Hives and Rash  . Mercurial Derivatives Itching and Rash  . Penicillins Hives and Rash    Has patient had a PCN reaction causing immediate rash, facial/tongue/throat swelling, SOB or lightheadedness with hypotension: Yes Has patient had a PCN reaction causing severe rash involving mucus membranes or skin necrosis: No Has patient had a PCN reaction that required hospitalization: No Has patient had a PCN reaction occurring within the last 10 years: Yes If all of the above answers are "NO", then may proceed with Cephalosporin use.  . Quinine Derivatives Hives and Rash     Social History   Socioeconomic History  . Marital status: Widowed    Spouse name: Not on file  . Number of children: 0  . Years of education: Not on file  . Highest education level: Doctorate  Occupational History  . Occupation: retired  Scientific laboratory technician  . Financial resource strain: Not hard at all  . Food insecurity    Worry: Never true    Inability: Never true  . Transportation needs    Medical: No    Non-medical: No  Tobacco Use  . Smoking status: Former Smoker    Packs/day: 1.00    Years: 45.00    Pack years: 45.00    Types: Cigarettes    Quit date: 07/24/1978    Years since quitting: 40.6  . Smokeless tobacco: Never Used  Substance and Sexual Activity  . Alcohol use: Yes    Alcohol/week: 7.0 standard drinks    Types: 7 Glasses of wine per week    Comment: every day  . Drug use: No  . Sexual activity: Not Currently  Lifestyle  . Physical activity    Days per week: Not on file    Minutes per session: Not on file  . Stress: Not on file  Relationships  . Social Herbalist on phone: Not on file    Gets together: Not on file    Attends religious service: Not on file    Active member of club or organization: Not on file    Attends meetings of clubs or organizations: Not on file    Relationship status: Not on file  . Intimate partner violence    Fear of  current or ex partner: Not on file    Emotionally abused: Not on file    Physically abused: Not on file    Forced sexual activity: Not on file  Other Topics Concern  . Not on file  Social History Narrative   Lives alone    Physical Exam Vitals signs reviewed.  Constitutional:      Appearance: She is normal weight.  HENT:     Head: Normocephalic.     Nose: Nose normal.     Mouth/Throat:     Mouth: Mucous membranes are moist.     Pharynx: Oropharynx is clear.  Eyes:     Pupils: Pupils are equal, round, and reactive to light.  Neck:     Musculoskeletal: Normal  range of motion.  Cardiovascular:     Rate and Rhythm: Normal rate.     Pulses: Normal pulses.  Pulmonary:     Effort: Pulmonary effort is normal.  Abdominal:     Palpations: Abdomen is soft.  Musculoskeletal: Normal range of motion.     Right lower leg: No edema.     Left lower leg: No edema.  Skin:    General: Skin is warm and dry.     Capillary Refill: Capillary refill takes less than 2 seconds.  Neurological:     Mental Status: She is alert. Mental status is at baseline.  Psychiatric:        Mood and Affect: Mood normal.    Arrived today for Jackie's home for home visit, she was seated in her walker alert and oriented appearing to be short of breath. Her oxygen was noted to be off and hanging on her walker. I asked her why she didn't have it on and she said she forgot to put it back on after returning home from her Lab visit. I obtained her pulse oximeter reading without oxygen and it reads 86%. I placed patient on her oxygen and her shortness of breath improved and her pulse oximeter reading improved to 94%. Lung sounds clear. Other vitals as recorded. I called [facility] RN to confirm any changes with medications and they confirmed no changes made after labs today. Medications were reviewed and verified. 2 pill boxes were filled due to my absence next week. Patient's granddaughter stated she would place her  Potassium in second pill box which is ready for pick up at [facility]. I left written instructions for same. I also advised them to contact my co-worker Joellen Jersey if any they needed anything. Patient and granddaughter understood and agreed. Patient stated the Metamucil was helping with her bowel movements but her granddaughter stated they were going to research getting a GI doctor for further evaluation. I suggested reaching out to her PCP and they stated they would take care of it. Patient also expressed she felt like she was getting a lot of labs drawn lately and would rather only one office take her labs and share information with other offices reference same. I advised patient I would make a note of same. Patient agreed to future visit on July 6th. Home visit complete.    CBG- 111   - Will be reaching out for portable O2 tank suggestions to [employer].     No future appointments.   ACTION: Home visit completed Next visit planned for July 6th at 12:00

## 2019-02-20 DIAGNOSIS — R69 Illness, unspecified: Secondary | ICD-10-CM | POA: Diagnosis not present

## 2019-02-21 ENCOUNTER — Telehealth (HOSPITAL_COMMUNITY): Payer: Self-pay | Admitting: Licensed Clinical Social Worker

## 2019-02-21 ENCOUNTER — Telehealth (HOSPITAL_COMMUNITY): Payer: Medicare HMO

## 2019-02-21 NOTE — Telephone Encounter (Signed)
Pt granddaughter inquiring about pt [place] foundation status.   CSW called foundation that reports pt is missing her signed consent form from her application.  CSW called and informed granddaughter- they will come by the clinic to sign it and turn it in sometime this morning.  CSW will continue to follow and assist as needed  Jorge Ny, [facility] Desk#: [phone] Cell#: [phone]

## 2019-02-21 NOTE — Telephone Encounter (Signed)
Pt enrolled in Moms Meals 3 month study through [facility]- patient's 3 month period has now ended so CSW spoke with pt to complete an exit interview regarding patient's experience:  1. Since being on Moms Meals have you noticed any changes in your health or the way you feel physically? no Weight loss? Caused weight gain which pt granddaughter stated was needed. Improvement in BP? unsure  2. What do you like about the program? Liked its convenience What would you like to change/add? Did not like the way the food tasted and thought that the meals had too much sodium (150mg  per meal) and carbs to be considered healthy  3. How did you get food prior to [place]? a. Did you get food stamps? no b. Did someone help financially? No but had family help obtaining food c. Did you access food banks? no d. Do you anticipate any issues transitioning back to getting your own food after this program ends? No- has granddaughter with her now to help her obtain food when needed.  4. Would you be interested in future food pilots? yes  5. Would you like a dietary consult? yes  6. Do you have internet access? Tablet/Smart Phone? Yes- has computer access.   Jorge Ny, LCSW Clinical Social Worker [facility] Desk#: [phone] Cell#: [phone]

## 2019-02-22 ENCOUNTER — Telehealth (HOSPITAL_COMMUNITY): Payer: Self-pay | Admitting: Cardiology

## 2019-02-22 DIAGNOSIS — I509 Heart failure, unspecified: Secondary | ICD-10-CM

## 2019-02-22 NOTE — Telephone Encounter (Signed)
Signed consent form faxed to [place].

## 2019-02-22 NOTE — Telephone Encounter (Signed)
Caregiver called to request portable oxygen order, patient would like to bring oxygen with her to doctors appts etc.  Orders placed

## 2019-02-22 NOTE — Addendum Note (Signed)
Addended by: Kerry Dory on: 02/22/2019 12:11 PM   Modules accepted: Orders

## 2019-02-27 ENCOUNTER — Telehealth (HOSPITAL_COMMUNITY): Payer: Self-pay | Admitting: Licensed Clinical Social Worker

## 2019-02-27 DIAGNOSIS — G4733 Obstructive sleep apnea (adult) (pediatric): Secondary | ICD-10-CM | POA: Diagnosis not present

## 2019-02-27 DIAGNOSIS — I509 Heart failure, unspecified: Secondary | ICD-10-CM | POA: Diagnosis not present

## 2019-02-27 DIAGNOSIS — J9601 Acute respiratory failure with hypoxia: Secondary | ICD-10-CM | POA: Diagnosis not present

## 2019-02-27 DIAGNOSIS — R0600 Dyspnea, unspecified: Secondary | ICD-10-CM | POA: Diagnosis not present

## 2019-02-27 DIAGNOSIS — J45909 Unspecified asthma, uncomplicated: Secondary | ICD-10-CM | POA: Diagnosis not present

## 2019-02-27 NOTE — Telephone Encounter (Signed)
Per Levada Dy with [facility]     She has a portable system already, the homefill is a portable system. She fills the tanks at home and takes them with her when she goes out. If they are not sure how to fill the tanks they can call into [place] at [phone] and speak to the call center. Our management advised this is the best system we can offer her at this time. She is 2liters continuous so she shouldn't be coming off the o2. She has tanks they fill to take her o2 with her.    Pt and patients care giver aware and voiced understanding

## 2019-02-27 NOTE — Telephone Encounter (Signed)
CSW informed that pt has been denied for Eliquis assistance through the [employer].  CSW called foundation who states pt is currently denied but would be eligible once her copay costs for the year have reached $636.55  CSW called pt pharmacy and they reported pt has currently spent $603.13 on medications for 2020  CSW called pt and updated- requested she have pharmacy print off expense sheet once she has reached the limit and we could fax it in for them at the office to get her approved for assistance.  CSW will continue to follow and assist as needed  Jorge Ny, [facility] Desk#: [phone] Cell#: [phone]

## 2019-02-27 NOTE — Telephone Encounter (Signed)
[facility] patient assistance foundation application received  Application denied    RE: product covered by insurance

## 2019-03-01 DIAGNOSIS — I509 Heart failure, unspecified: Secondary | ICD-10-CM | POA: Diagnosis not present

## 2019-03-05 ENCOUNTER — Other Ambulatory Visit (HOSPITAL_COMMUNITY): Payer: Self-pay

## 2019-03-05 ENCOUNTER — Telehealth (HOSPITAL_COMMUNITY): Payer: Self-pay

## 2019-03-05 NOTE — Telephone Encounter (Signed)
Spoke to [place], confirmed appointment for today.

## 2019-03-05 NOTE — Progress Notes (Signed)
Paramedicine Encounter    Patient ID: Kim Mullins, female    DOB: 1929-12-25, 85 y.o.   MRN: [id number]   Patient Care Team: Lin Landsman, MD as PCP - General (Family Medicine) Larey Dresser, MD as PCP - Advanced Heart Failure (Cardiology) Jorge Ny, LCSW as Social Worker (Licensed Clinical Social Worker)  Patient Active Problem List   Diagnosis Date Noted  . Obesity (BMI 30-39.9) 10/20/2018  . Acute on chronic heart failure ([place]) 08/19/2018  . Palliative care encounter   . Palliative care by specialist   . Acute respiratory failure with hypoxia ([place]) 08/18/2018  . Diabetes ([place]) 08/18/2018  . CKD (chronic kidney disease) 08/18/2018  . Amyloidosis ([place]) 08/18/2018  . Acute on chronic combined systolic and diastolic heart failure ([place]) 08/17/2018  . GERD (gastroesophageal reflux disease) 08/17/2018  . Elevated troponin 08/17/2018  . Glaucoma 08/17/2018  . Hyperbilirubinemia 08/17/2018  . CHF (congestive heart failure) ([place]) 07/18/2018  . Gait abnormality 05/03/2017  . Hypotension 04/14/2016  . Hypotension due to drugs   . Chronic anticoagulation   . TIA (transient ischemic attack) 04/12/2016  . UTI (lower urinary tract infection) 04/12/2016  . AKI (acute kidney injury) ([place]) 04/12/2016  . Atrial fibrillation ([place]) 09/12/2015  . Occipital infarction ([place])   . History of stroke 06/18/2015  . Homonymous hemianopsia   . Pacemaker 01/21/2015  . OSA (obstructive sleep apnea) 10/23/2014  . Dizziness 10/21/2014  . Complete heart block ([place]) 10/06/2014  . Benign essential HTN 10/04/2014  . Acute on chronic systolic CHF (congestive heart failure) ([place]) 10/04/2014  . Abnormal cardiovascular stress test 08/27/2014  . Pulmonary HTN ([place]) 08/16/2014  . DCM (dilated cardiomyopathy) ([place]) 08/16/2014  . Cough 07/13/2014  . Esophageal stricture 06/11/2014  . Nonspecific (abnormal) findings on radiological and other examination of gastrointestinal tract 05/29/2014  .  Dyspnea 04/29/2014  . Osteoarthritis of right shoulder region 06/26/2013  . DJD (degenerative joint disease) of hip 11/16/2011    Class: Present on Admission    Current Outpatient Medications:  .  Acetaminophen (TYLENOL) 325 MG CAPS, Take 650 mg by mouth daily as needed (for shoulder pain). , Disp: , Rfl:  .  apixaban (ELIQUIS) 2.5 MG TABS tablet, Take 1 tablet (2.5 mg total) by mouth 2 (two) times daily., Disp: 60 tablet, Rfl: 11 .  bumetanide (BUMEX) 2 MG tablet, Take 2 tablets (4 mg total) by mouth 2 (two) times daily. Take additional dose on Sunday if weight 149lbs or greater., Disp: 60 tablet, Rfl: 6 .  diclofenac sodium (VOLTAREN) 1 % GEL, Apply 2 g topically 3 (three) times daily as needed (pain, inflammation)., Disp: 100 g, Rfl: 0 .  glucose blood test strip, Use to test blood sugar twice daily (Accuchek Smartview), Disp: 100 each, Rfl: 0 .  latanoprost (XALATAN) 0.005 % ophthalmic solution, Place 1 drop into both eyes at bedtime. , Disp: , Rfl: 11 .  OXYGEN, Inhale 2 L into the lungs., Disp: , Rfl:  .  potassium chloride SA (K-DUR) 20 MEQ tablet, Take 2 tablets (40 mEq total) by mouth 2 (two) times daily., Disp: 120 tablet, Rfl: 5 .  timolol (TIMOPTIC) 0.5 % ophthalmic solution, Place 1 drop into both eyes daily. , Disp: , Rfl: 11 .  docusate sodium (COLACE) 100 MG capsule, Take 1 capsule (100 mg total) by mouth every 12 (twelve) hours. (Patient not taking: Reported on 02/12/2019), Disp: 60 capsule, Rfl: 0 .  metolazone (ZAROXOLYN) 2.5 MG tablet, Take 1 tablet (2.5 mg total)  by mouth as directed. (Patient not taking: Reported on 02/12/2019), Disp: 5 tablet, Rfl: 3 .  NON FORMULARY, impax 4 mg daily at bedtime, Disp: , Rfl:  Allergies  Allergen Reactions  . Hydrocodone Other (See Comments)    Took away all energy and made her stop eating food for short time  . Percocet [Oxycodone-Acetaminophen] Other (See Comments)    Too away all energy and made her stop eating food for short time  .  Eggs Or Egg-Derived Products Hives and Rash  . Mercurial Derivatives Itching and Rash  . Penicillins Hives and Rash    Has patient had a PCN reaction causing immediate rash, facial/tongue/throat swelling, SOB or lightheadedness with hypotension: Yes Has patient had a PCN reaction causing severe rash involving mucus membranes or skin necrosis: No Has patient had a PCN reaction that required hospitalization: No Has patient had a PCN reaction occurring within the last 10 years: Yes If all of the above answers are "NO", then may proceed with Cephalosporin use.  . Quinine Derivatives Hives and Rash     Social History   Socioeconomic History  . Marital status: Widowed    Spouse name: Not on file  . Number of children: 0  . Years of education: Not on file  . Highest education level: Doctorate  Occupational History  . Occupation: retired  Scientific laboratory technician  . Financial resource strain: Not hard at all  . Food insecurity    Worry: Never true    Inability: Never true  . Transportation needs    Medical: No    Non-medical: No  Tobacco Use  . Smoking status: Former Smoker    Packs/day: 1.00    Years: 45.00    Pack years: 45.00    Types: Cigarettes    Quit date: 07/24/1978    Years since quitting: 40.6  . Smokeless tobacco: Never Used  Substance and Sexual Activity  . Alcohol use: Yes    Alcohol/week: 7.0 standard drinks    Types: 7 Glasses of wine per week    Comment: every day  . Drug use: No  . Sexual activity: Not Currently  Lifestyle  . Physical activity    Days per week: Not on file    Minutes per session: Not on file  . Stress: Not on file  Relationships  . Social Herbalist on phone: Not on file    Gets together: Not on file    Attends religious service: Not on file    Active member of club or organization: Not on file    Attends meetings of clubs or organizations: Not on file    Relationship status: Not on file  . Intimate partner violence    Fear of  current or ex partner: Not on file    Emotionally abused: Not on file    Physically abused: Not on file    Forced sexual activity: Not on file  Other Topics Concern  . Not on file  Social History Narrative   Lives alone    Physical Exam Vitals signs reviewed.  HENT:     Head: Normocephalic.     Nose: Nose normal.     Mouth/Throat:     Mouth: Mucous membranes are moist.  Eyes:     Pupils: Pupils are equal, round, and reactive to light.  Neck:     Musculoskeletal: Normal range of motion.  Cardiovascular:     Rate and Rhythm: Normal rate. Rhythm irregular.  Pulses: Normal pulses.     Heart sounds: Normal heart sounds.  Pulmonary:     Effort: Pulmonary effort is normal.  Abdominal:     General: Abdomen is flat.     Palpations: Abdomen is soft.  Musculoskeletal: Normal range of motion.     Right lower leg: Edema present.     Left lower leg: Edema present.     Comments: Pain in left shoulder due to recent fall.   Skin:    General: Skin is warm and dry.     Capillary Refill: Capillary refill takes less than 2 seconds.  Neurological:     Mental Status: She is alert. Mental status is at baseline.  Psychiatric:        Mood and Affect: Mood normal.     Arrived for home visit with Kim Mullins who was alert seated in her chair style walker. Patient stated she was feeling good, she stated she fell Saturday while in the bathroom causing some pain to the left shoulder. Same was assessed and noted to have no deformity to shoulder, no tenderness on palpation, patient had range of motion present but did have some pain upon ROM. Patient had no other complaints or injuries noted related to fall. Patient did state her swelling in her feet was increased along with her shortness of breath upon exertion. Upon reviewing pill boxes patient missed multiple doses over the last week. Patient stated she has been getting forgetful when remembering to take her medicines. She agreed to have her granddaughter  assist in reminding her to take her medications. Granddaughter agreed to same. Medications were reviewed and confirmed. Pill box was filled accordingly.   Patient was reminded about Kim Mullins declining her request for medicaiton assistance with Eliquis. Patient was advised once she hits the quota for same she could be approved. Patient is going to pick up Eliquis refill this week and have pharmacy send over copay totals to HF clinic to have Kim Mullins review same and re-apply Kim Mullins for the [employer]. I will be informing Kim Mullins of same.   Vitals were obtained and reviewed. Patient agreed to home visit in one week.   Medications Ordered: Eliquis    CBG- [id number]   No future appointments.   ACTION: Home visit completed Next visit planned for one week

## 2019-03-06 DIAGNOSIS — R2681 Unsteadiness on feet: Secondary | ICD-10-CM | POA: Diagnosis not present

## 2019-03-06 DIAGNOSIS — I509 Heart failure, unspecified: Secondary | ICD-10-CM | POA: Diagnosis not present

## 2019-03-06 DIAGNOSIS — D649 Anemia, unspecified: Secondary | ICD-10-CM | POA: Diagnosis not present

## 2019-03-06 DIAGNOSIS — I10 Essential (primary) hypertension: Secondary | ICD-10-CM | POA: Diagnosis not present

## 2019-03-06 DIAGNOSIS — E119 Type 2 diabetes mellitus without complications: Secondary | ICD-10-CM | POA: Diagnosis not present

## 2019-03-06 DIAGNOSIS — R0602 Shortness of breath: Secondary | ICD-10-CM | POA: Diagnosis not present

## 2019-03-06 DIAGNOSIS — Z8673 Personal history of transient ischemic attack (TIA), and cerebral infarction without residual deficits: Secondary | ICD-10-CM | POA: Diagnosis not present

## 2019-03-06 DIAGNOSIS — R001 Bradycardia, unspecified: Secondary | ICD-10-CM | POA: Diagnosis not present

## 2019-03-08 ENCOUNTER — Telehealth (HOSPITAL_COMMUNITY): Payer: Self-pay | Admitting: Licensed Clinical Social Worker

## 2019-03-08 NOTE — Telephone Encounter (Signed)
CSW received fax with pharmacy records for 2020 copays for patient.  CSW faxed in to [facility] for review- fax confirmation received.  Pt updated  CSW will continue to follow and assist as needed  Jorge Ny, [facility] Desk#: [phone] Cell#: [phone]

## 2019-03-12 ENCOUNTER — Telehealth (HOSPITAL_COMMUNITY): Payer: Self-pay | Admitting: *Deleted

## 2019-03-12 ENCOUNTER — Other Ambulatory Visit (HOSPITAL_COMMUNITY): Payer: Self-pay

## 2019-03-12 ENCOUNTER — Other Ambulatory Visit (HOSPITAL_COMMUNITY): Payer: Self-pay | Admitting: *Deleted

## 2019-03-12 MED ORDER — METOLAZONE 2.5 MG PO TABS: ORAL_TABLET | ORAL | 3 refills | Status: DC

## 2019-03-12 NOTE — Telephone Encounter (Signed)
Heather w/paramedicine called to report patients weight is up, she is short of breath, and edema in both legs. Per Dr.McLean take Metolazone today may take another one tomorrow if weight is not down.   Heather aware and agreeable with plan

## 2019-03-12 NOTE — Progress Notes (Signed)
Paramedicine Encounter    Patient ID: Kim Mullins, female    DOB: 04/13/1930, 85 y.o.   MRN: [id number]   Patient Care Team: Lin Landsman, MD as PCP - General (Family Medicine) Larey Dresser, MD as PCP - Advanced Heart Failure (Cardiology) Jorge Ny, LCSW as Social Worker (Licensed Clinical Social Worker)  Patient Active Problem List   Diagnosis Date Noted  . Obesity (BMI 30-39.9) 10/20/2018  . Acute on chronic heart failure ([place]) 08/19/2018  . Palliative care encounter   . Palliative care by specialist   . Acute respiratory failure with hypoxia ([place]) 08/18/2018  . Diabetes ([place]) 08/18/2018  . CKD (chronic kidney disease) 08/18/2018  . Amyloidosis ([place]) 08/18/2018  . Acute on chronic combined systolic and diastolic heart failure ([place]) 08/17/2018  . GERD (gastroesophageal reflux disease) 08/17/2018  . Elevated troponin 08/17/2018  . Glaucoma 08/17/2018  . Hyperbilirubinemia 08/17/2018  . CHF (congestive heart failure) ([place]) 07/18/2018  . Gait abnormality 05/03/2017  . Hypotension 04/14/2016  . Hypotension due to drugs   . Chronic anticoagulation   . TIA (transient ischemic attack) 04/12/2016  . UTI (lower urinary tract infection) 04/12/2016  . AKI (acute kidney injury) ([place]) 04/12/2016  . Atrial fibrillation ([place]) 09/12/2015  . Occipital infarction ([place])   . History of stroke 06/18/2015  . Homonymous hemianopsia   . Pacemaker 01/21/2015  . OSA (obstructive sleep apnea) 10/23/2014  . Dizziness 10/21/2014  . Complete heart block ([place]) 10/06/2014  . Benign essential HTN 10/04/2014  . Acute on chronic systolic CHF (congestive heart failure) ([place]) 10/04/2014  . Abnormal cardiovascular stress test 08/27/2014  . Pulmonary HTN ([place]) 08/16/2014  . DCM (dilated cardiomyopathy) ([place]) 08/16/2014  . Cough 07/13/2014  . Esophageal stricture 06/11/2014  . Nonspecific (abnormal) findings on radiological and other examination of gastrointestinal tract 05/29/2014  .  Dyspnea 04/29/2014  . Osteoarthritis of right shoulder region 06/26/2013  . DJD (degenerative joint disease) of hip 11/16/2011    Class: Present on Admission    Current Outpatient Medications:  .  apixaban (ELIQUIS) 2.5 MG TABS tablet, Take 1 tablet (2.5 mg total) by mouth 2 (two) times daily., Disp: 60 tablet, Rfl: 11 .  bumetanide (BUMEX) 2 MG tablet, Take 2 tablets (4 mg total) by mouth 2 (two) times daily. Take additional dose on Sunday if weight 149lbs or greater., Disp: 60 tablet, Rfl: 6 .  glucose blood test strip, Use to test blood sugar twice daily (Accuchek Smartview), Disp: 100 each, Rfl: 0 .  latanoprost (XALATAN) 0.005 % ophthalmic solution, Place 1 drop into both eyes at bedtime. , Disp: , Rfl: 11 .  metolazone (ZAROXOLYN) 2.5 MG tablet, Take only as directed by the CHF clinic., Disp: 5 tablet, Rfl: 3 .  OXYGEN, Inhale 2 L into the lungs., Disp: , Rfl:  .  potassium chloride SA (K-DUR) 20 MEQ tablet, Take 2 tablets (40 mEq total) by mouth 2 (two) times daily., Disp: 120 tablet, Rfl: 5 .  timolol (TIMOPTIC) 0.5 % ophthalmic solution, Place 1 drop into both eyes daily. , Disp: , Rfl: 11 .  Acetaminophen (TYLENOL) 325 MG CAPS, Take 650 mg by mouth daily as needed (for shoulder pain). , Disp: , Rfl:  .  diclofenac sodium (VOLTAREN) 1 % GEL, Apply 2 g topically 3 (three) times daily as needed (pain, inflammation). (Patient not taking: Reported on 03/12/2019), Disp: 100 g, Rfl: 0 .  docusate sodium (COLACE) 100 MG capsule, Take 1 capsule (100 mg total) by mouth every 12 (  twelve) hours. (Patient not taking: Reported on 02/12/2019), Disp: 60 capsule, Rfl: 0 .  NON FORMULARY, impax 4 mg daily at bedtime, Disp: , Rfl:  Allergies  Allergen Reactions  . Hydrocodone Other (See Comments)    Took away all energy and made her stop eating food for short time  . Percocet [Oxycodone-Acetaminophen] Other (See Comments)    Too away all energy and made her stop eating food for short time  . Eggs Or  Egg-Derived Products Hives and Rash  . Mercurial Derivatives Itching and Rash  . Penicillins Hives and Rash    Has patient had a PCN reaction causing immediate rash, facial/tongue/throat swelling, SOB or lightheadedness with hypotension: Yes Has patient had a PCN reaction causing severe rash involving mucus membranes or skin necrosis: No Has patient had a PCN reaction that required hospitalization: No Has patient had a PCN reaction occurring within the last 10 years: Yes If all of the above answers are "NO", then may proceed with Cephalosporin use.  . Quinine Derivatives Hives and Rash     Social History   Socioeconomic History  . Marital status: Widowed    Spouse name: Not on file  . Number of children: 0  . Years of education: Not on file  . Highest education level: Doctorate  Occupational History  . Occupation: retired  Scientific laboratory technician  . Financial resource strain: Not hard at all  . Food insecurity    Worry: Never true    Inability: Never true  . Transportation needs    Medical: No    Non-medical: No  Tobacco Use  . Smoking status: Former Smoker    Packs/day: 1.00    Years: 45.00    Pack years: 45.00    Types: Cigarettes    Quit date: 07/24/1978    Years since quitting: 40.6  . Smokeless tobacco: Never Used  Substance and Sexual Activity  . Alcohol use: Yes    Alcohol/week: 7.0 standard drinks    Types: 7 Glasses of wine per week    Comment: every day  . Drug use: No  . Sexual activity: Not Currently  Lifestyle  . Physical activity    Days per week: Not on file    Minutes per session: Not on file  . Stress: Not on file  Relationships  . Social Herbalist on phone: Not on file    Gets together: Not on file    Attends religious service: Not on file    Active member of club or organization: Not on file    Attends meetings of clubs or organizations: Not on file    Relationship status: Not on file  . Intimate partner violence    Fear of current or ex  partner: Not on file    Emotionally abused: Not on file    Physically abused: Not on file    Forced sexual activity: Not on file  Other Topics Concern  . Not on file  Social History Narrative   Lives alone    Physical Exam Vitals signs reviewed.  HENT:     Head: Normocephalic.     Nose: Nose normal.     Mouth/Throat:     Mouth: Mucous membranes are moist.  Eyes:     Pupils: Pupils are equal, round, and reactive to light.  Neck:     Musculoskeletal: Normal range of motion.  Cardiovascular:     Rate and Rhythm: Normal rate and regular rhythm.     Pulses:  Normal pulses.     Heart sounds: Normal heart sounds.  Pulmonary:     Effort: Respiratory distress present.     Breath sounds: Normal breath sounds.  Abdominal:     General: Abdomen is flat.     Palpations: Abdomen is soft.  Musculoskeletal: Normal range of motion.     Right lower leg: Edema present.     Left lower leg: Edema present.  Skin:    General: Skin is warm and dry.     Capillary Refill: Capillary refill takes less than 2 seconds.  Neurological:     General: No focal deficit present.     Mental Status: She is alert.  Psychiatric:        Mood and Affect: Mood normal.    Arrived for home visit today for Kennyth Lose who was seated in her walker alert but seemed "sleepy". Jackie's granddaughter explained that Kennyth Lose has been more tired lately as well as having more trouble breathing even with her oxygen on. Granddaughter explained Kennyth Lose would be sitting in her walker resting and be showing obvious signs of labored breathing. Kennyth Lose has been wearing her oxygen but admits when going to the bathroom she takes it off or sometimes it falls off while she is sleeping. Granddaughter advised she has been trying to encourage Kennyth Lose to wear the oxygen continuously. Jackie's granddaughter stated she had been giving Kennyth Lose her medications but 3 doses throughout the week were missed. Patient had obvious pitting edema to both lower  extremities up to the top's of her tib/fib area. Patient also noted to have gained a few pounds since our last visit. Vitals were obtained. I contacted the HF clinic in which [place] stated Dr. Aundra Dubin wanted patient to take one Metolazone today and take another tomorrow if no urine output noted or weight decrease noted. Patient and granddaughter understood same. Patient's granddaughter stated she would follow up with me on same. Medications verified and confirmed, pill box filled accordingly. Patient agreed to visit next Monday but understood to call me with updates regarding Metolazone and fluid retention. Home visit complete.      No future appointments.   ACTION: Home visit completed Next visit planned for one week.

## 2019-03-15 ENCOUNTER — Telehealth (HOSPITAL_COMMUNITY): Payer: Self-pay | Admitting: Licensed Clinical Social Worker

## 2019-03-15 NOTE — Telephone Encounter (Signed)
CSW called [place] [facility] to check on application status.  Per foundation, pharmacy records sent in did not show that she had reached the out of pocket requirement of $636.  CSW called pt pharmacy to inquire and they state she has currently spent $622 out of pocket so she is $14 short of qualifying.  CSW called pt granddaughter and left message explaining the delay in Eliquis assistance- CSW will continue to follow and assist with completing application once patient has reached out of pocket requirement.  Jorge Ny, LCSW Clinical Social Worker [facility] Desk#: [phone] Cell#: [phone]

## 2019-03-19 ENCOUNTER — Other Ambulatory Visit (HOSPITAL_COMMUNITY): Payer: Self-pay

## 2019-03-19 NOTE — Progress Notes (Signed)
Paramedicine Encounter    Patient ID: Kim Mullins, female    DOB: [birth year], 85 y.o.   MRN: [id number]   Patient Care Team: Lin Landsman, MD as PCP - General (Family Medicine) Larey Dresser, MD as PCP - Advanced Heart Failure (Cardiology) Jorge Ny, LCSW as Social Worker (Licensed Clinical Social Worker)  Patient Active Problem List   Diagnosis Date Noted  . Obesity (BMI 30-39.9) 10/20/2018  . Acute on chronic heart failure ([place]) 08/19/2018  . Palliative care encounter   . Palliative care by specialist   . Acute respiratory failure with hypoxia ([place]) 08/18/2018  . Diabetes ([place]) 08/18/2018  . CKD (chronic kidney disease) 08/18/2018  . Amyloidosis ([place]) 08/18/2018  . Acute on chronic combined systolic and diastolic heart failure ([place]) 08/17/2018  . GERD (gastroesophageal reflux disease) 08/17/2018  . Elevated troponin 08/17/2018  . Glaucoma 08/17/2018  . Hyperbilirubinemia 08/17/2018  . CHF (congestive heart failure) ([place]) 07/18/2018  . Gait abnormality 05/03/2017  . Hypotension 04/14/2016  . Hypotension due to drugs   . Chronic anticoagulation   . TIA (transient ischemic attack) 04/12/2016  . UTI (lower urinary tract infection) 04/12/2016  . AKI (acute kidney injury) ([place]) 04/12/2016  . Atrial fibrillation ([place]) 09/12/2015  . Occipital infarction ([place])   . History of stroke 06/18/2015  . Homonymous hemianopsia   . Pacemaker 01/21/2015  . OSA (obstructive sleep apnea) 10/23/2014  . Dizziness 10/21/2014  . Complete heart block ([place]) 10/06/2014  . Benign essential HTN 10/04/2014  . Acute on chronic systolic CHF (congestive heart failure) ([place]) 10/04/2014  . Abnormal cardiovascular stress test 08/27/2014  . Pulmonary HTN ([place]) 08/16/2014  . DCM (dilated cardiomyopathy) ([place]) 08/16/2014  . Cough 07/13/2014  . Esophageal stricture 06/11/2014  . Nonspecific (abnormal) findings on radiological and other examination of gastrointestinal tract 05/29/2014  .  Dyspnea 04/29/2014  . Osteoarthritis of right shoulder region 06/26/2013  . DJD (degenerative joint disease) of hip 11/16/2011    Class: Present on Admission    Current Outpatient Medications:  .  apixaban (ELIQUIS) 2.5 MG TABS tablet, Take 1 tablet (2.5 mg total) by mouth 2 (two) times daily., Disp: 60 tablet, Rfl: 11 .  bumetanide (BUMEX) 2 MG tablet, Take 2 tablets (4 mg total) by mouth 2 (two) times daily. Take additional dose on Sunday if weight 149lbs or greater., Disp: 60 tablet, Rfl: 6 .  fexofenadine (ALLEGRA) 180 MG tablet, Take 180 mg by mouth daily., Disp: , Rfl:  .  glucose blood test strip, Use to test blood sugar twice daily ([employer]uchek Smartview), Disp: 100 each, Rfl: 0 .  latanoprost (XALATAN) 0.005 % ophthalmic solution, Place 1 drop into both eyes at bedtime. , Disp: , Rfl: 11 .  metolazone (ZAROXOLYN) 2.5 MG tablet, Take only as directed by the CHF clinic., Disp: 5 tablet, Rfl: 3 .  OXYGEN, Inhale 2 L into the lungs., Disp: , Rfl:  .  potassium chloride SA (K-DUR) 20 MEQ tablet, Take 2 tablets (40 mEq total) by mouth 2 (two) times daily., Disp: 120 tablet, Rfl: 5 .  timolol (TIMOPTIC) 0.5 % ophthalmic solution, Place 1 drop into both eyes daily. , Disp: , Rfl: 11 .  Acetaminophen (TYLENOL) 325 MG CAPS, Take 650 mg by mouth daily as needed (for shoulder pain). , Disp: , Rfl:  .  diclofenac sodium (VOLTAREN) 1 % GEL, Apply 2 g topically 3 (three) times daily as needed (pain, inflammation). (Patient not taking: Reported on 03/12/2019), Disp: 100 g, Rfl: 0 .  docusate sodium (COLACE) 100 MG capsule, Take 1 capsule (100 mg total) by mouth every 12 (twelve) hours. (Patient not taking: Reported on 02/12/2019), Disp: 60 capsule, Rfl: 0 .  NON FORMULARY, impax 4 mg daily at bedtime, Disp: , Rfl:  Allergies  Allergen Reactions  . Hydrocodone Other (See Comments)    Took away all energy and made her stop eating food for short time  . Percocet [Oxycodone-Acetaminophen] Other (See  Comments)    Too away all energy and made her stop eating food for short time  . Eggs Or Egg-Derived Products Hives and Rash  . Mercurial Derivatives Itching and Rash  . Penicillins Hives and Rash    Has patient had a PCN reaction causing immediate rash, facial/tongue/throat swelling, SOB or lightheadedness with hypotension: Yes Has patient had a PCN reaction causing severe rash involving mucus membranes or skin necrosis: No Has patient had a PCN reaction that required hospitalization: No Has patient had a PCN reaction occurring within the last 10 years: Yes If all of the above answers are "NO", then may proceed with Cephalosporin use.  . Quinine Derivatives Hives and Rash     Social History   Socioeconomic History  . Marital status: Widowed    Spouse name: Not on file  . Number of children: 0  . Years of education: Not on file  . Highest education level: Doctorate  Occupational History  . Occupation: retired  Scientific laboratory technician  . Financial resource strain: Not hard at all  . Food insecurity    Worry: Never true    Inability: Never true  . Transportation needs    Medical: No    Non-medical: No  Tobacco Use  . Smoking status: Former Smoker    Packs/day: 1.00    Years: 45.00    Pack years: 45.00    Types: Cigarettes    Quit date: 07/24/1978    Years since quitting: 40.6  . Smokeless tobacco: Never Used  Substance and Sexual Activity  . Alcohol use: Yes    Alcohol/week: 7.0 standard drinks    Types: 7 Glasses of wine per week    Comment: every day  . Drug use: No  . Sexual activity: Not Currently  Lifestyle  . Physical activity    Days per week: Not on file    Minutes per session: Not on file  . Stress: Not on file  Relationships  . Social Herbalist on phone: Not on file    Gets together: Not on file    Attends religious service: Not on file    Active member of club or organization: Not on file    Attends meetings of clubs or organizations: Not on file     Relationship status: Not on file  . Intimate partner violence    Fear of current or ex partner: Not on file    Emotionally abused: Not on file    Physically abused: Not on file    Forced sexual activity: Not on file  Other Topics Concern  . Not on file  Social History Narrative   Lives alone    Physical Exam Vitals signs reviewed.  HENT:     Head: Normocephalic.     Nose: Nose normal.     Mouth/Throat:     Mouth: Mucous membranes are moist.  Eyes:     Pupils: Pupils are equal, round, and reactive to light.  Neck:     Musculoskeletal: Normal range of motion.  Cardiovascular:  Rate and Rhythm: Normal rate.     Pulses: Normal pulses.  Pulmonary:     Effort: Pulmonary effort is normal.  Abdominal:     Palpations: Abdomen is soft.  Musculoskeletal: Normal range of motion.     Right lower leg: Edema present.     Left lower leg: Edema present.  Skin:    General: Skin is warm and dry.     Capillary Refill: Capillary refill takes less than 2 seconds.  Neurological:     General: No focal deficit present.     Mental Status: She is alert. Mental status is at baseline.  Psychiatric:        Mood and Affect: Mood normal.    Arrived for home visit for Kim Mullins who was seated in her wheelchair alert and oriented with no complaints. She stated she was feeling good just "sleepy". Patient stated she has been feeling much better this week after taking the one dose of metolazone. Patient still had some swelling noted to her feet but her breathing efforts were normal with no exertion. Patient was using her oxygen and stated she has been using it constantly. Patient's lung sounds were clear and equal. Patient only missed on PM dose in her pill box all other medications were taken as prescribed. Pills were verified and confirmed. Pill box was filled accordingly. Patient stated she is still having on and off trouble with her bowel movements but has not been using the Benefiber daily as told. She  stated she will try to do better about using it daily. Patient agreed to home visit in one week. Home visit complete.   NO MEDS ORDERED.   Weight- 125lbs CBG- 164    No future appointments.   ACTION: Home visit completed Next visit planned for one week

## 2019-03-21 ENCOUNTER — Other Ambulatory Visit: Payer: Self-pay

## 2019-03-21 ENCOUNTER — Ambulatory Visit (INDEPENDENT_AMBULATORY_CARE_PROVIDER_SITE_OTHER): Payer: Medicare HMO

## 2019-03-21 ENCOUNTER — Encounter: Payer: Self-pay | Admitting: Podiatry

## 2019-03-21 ENCOUNTER — Ambulatory Visit: Payer: Medicare HMO | Admitting: Podiatry

## 2019-03-21 ENCOUNTER — Other Ambulatory Visit: Payer: Self-pay | Admitting: Podiatry

## 2019-03-21 DIAGNOSIS — R600 Localized edema: Secondary | ICD-10-CM

## 2019-03-21 DIAGNOSIS — M79671 Pain in right foot: Secondary | ICD-10-CM | POA: Diagnosis not present

## 2019-03-21 DIAGNOSIS — M79676 Pain in unspecified toe(s): Secondary | ICD-10-CM | POA: Diagnosis not present

## 2019-03-21 DIAGNOSIS — R0989 Other specified symptoms and signs involving the circulatory and respiratory systems: Secondary | ICD-10-CM | POA: Diagnosis not present

## 2019-03-21 DIAGNOSIS — B351 Tinea unguium: Secondary | ICD-10-CM | POA: Diagnosis not present

## 2019-03-21 DIAGNOSIS — M79672 Pain in left foot: Secondary | ICD-10-CM

## 2019-03-22 ENCOUNTER — Other Ambulatory Visit: Payer: Self-pay

## 2019-03-22 DIAGNOSIS — M79672 Pain in left foot: Secondary | ICD-10-CM

## 2019-03-22 DIAGNOSIS — R0989 Other specified symptoms and signs involving the circulatory and respiratory systems: Secondary | ICD-10-CM

## 2019-03-22 DIAGNOSIS — R600 Localized edema: Secondary | ICD-10-CM

## 2019-03-22 DIAGNOSIS — M79671 Pain in right foot: Secondary | ICD-10-CM

## 2019-03-23 ENCOUNTER — Other Ambulatory Visit: Payer: Self-pay

## 2019-03-23 ENCOUNTER — Ambulatory Visit (HOSPITAL_COMMUNITY)
Admission: RE | Admit: 2019-03-23 | Discharge: 2019-03-23 | Disposition: A | Discharge status: Home / Self Care | Payer: Medicare HMO | Source: Ambulatory Visit | Attending: Internal Medicine | Admitting: Internal Medicine

## 2019-03-23 DIAGNOSIS — R0989 Other specified symptoms and signs involving the circulatory and respiratory systems: Secondary | ICD-10-CM | POA: Diagnosis not present

## 2019-03-23 DIAGNOSIS — M79671 Pain in right foot: Secondary | ICD-10-CM | POA: Diagnosis not present

## 2019-03-23 DIAGNOSIS — M79672 Pain in left foot: Secondary | ICD-10-CM

## 2019-03-23 DIAGNOSIS — R600 Localized edema: Secondary | ICD-10-CM | POA: Diagnosis not present

## 2019-03-26 ENCOUNTER — Other Ambulatory Visit (HOSPITAL_COMMUNITY): Payer: Self-pay

## 2019-03-26 ENCOUNTER — Other Ambulatory Visit (HOSPITAL_COMMUNITY): Payer: Self-pay | Admitting: Podiatry

## 2019-03-26 DIAGNOSIS — I739 Peripheral vascular disease, unspecified: Secondary | ICD-10-CM

## 2019-03-26 NOTE — Progress Notes (Signed)
Paramedicine Encounter    Patient ID: Kim Mullins, female    DOB: 24-Jul-1930, 85 y.o.   MRN: [id number]   Patient Care Team: Lin Landsman, MD as PCP - General (Family Medicine) Larey Dresser, MD as PCP - Advanced Heart Failure (Cardiology) Jorge Ny, LCSW as Social Worker (Licensed Clinical Social Worker)  Patient Active Problem List   Diagnosis Date Noted  . Obesity (BMI 30-39.9) 10/20/2018  . Acute on chronic heart failure ([place]) 08/19/2018  . Palliative care encounter   . Palliative care by specialist   . Acute respiratory failure with hypoxia ([place]) 08/18/2018  . Diabetes ([place]) 08/18/2018  . CKD (chronic kidney disease) 08/18/2018  . Amyloidosis ([place]) 08/18/2018  . Acute on chronic combined systolic and diastolic heart failure ([place]) 08/17/2018  . GERD (gastroesophageal reflux disease) 08/17/2018  . Elevated troponin 08/17/2018  . Glaucoma 08/17/2018  . Hyperbilirubinemia 08/17/2018  . CHF (congestive heart failure) ([place]) 07/18/2018  . Gait abnormality 05/03/2017  . Hypotension 04/14/2016  . Hypotension due to drugs   . Chronic anticoagulation   . TIA (transient ischemic attack) 04/12/2016  . UTI (lower urinary tract infection) 04/12/2016  . AKI (acute kidney injury) ([place]) 04/12/2016  . Atrial fibrillation ([place]) 09/12/2015  . Occipital infarction ([place])   . History of stroke 06/18/2015  . Homonymous hemianopsia   . Pacemaker 01/21/2015  . OSA (obstructive sleep apnea) 10/23/2014  . Dizziness 10/21/2014  . Complete heart block ([place]) 10/06/2014  . Benign essential HTN 10/04/2014  . Acute on chronic systolic CHF (congestive heart failure) ([place]) 10/04/2014  . Abnormal cardiovascular stress test 08/27/2014  . Pulmonary HTN ([place]) 08/16/2014  . DCM (dilated cardiomyopathy) ([place]) 08/16/2014  . Cough 07/13/2014  . Esophageal stricture 06/11/2014  . Nonspecific (abnormal) findings on radiological and other examination of gastrointestinal tract 05/29/2014  .  Dyspnea 04/29/2014  . Osteoarthritis of right shoulder region 06/26/2013  . DJD (degenerative joint disease) of hip 11/16/2011    Class: Present on Admission    Current Outpatient Medications:  .  apixaban (ELIQUIS) 2.5 MG TABS tablet, Take 1 tablet (2.5 mg total) by mouth 2 (two) times daily., Disp: 60 tablet, Rfl: 11 .  bumetanide (BUMEX) 2 MG tablet, Take 2 tablets (4 mg total) by mouth 2 (two) times daily. Take additional dose on Sunday if weight 149lbs or greater., Disp: 60 tablet, Rfl: 6 .  fexofenadine (ALLEGRA) 180 MG tablet, Take 180 mg by mouth daily., Disp: , Rfl:  .  glucose blood test strip, Use to test blood sugar twice daily (Accuchek Smartview), Disp: 100 each, Rfl: 0 .  latanoprost (XALATAN) 0.005 % ophthalmic solution, Place 1 drop into both eyes at bedtime. , Disp: , Rfl: 11 .  OXYGEN, Inhale 2 L into the lungs., Disp: , Rfl:  .  potassium chloride SA (K-DUR) 20 MEQ tablet, Take 2 tablets (40 mEq total) by mouth 2 (two) times daily., Disp: 120 tablet, Rfl: 5 .  timolol (TIMOPTIC) 0.5 % ophthalmic solution, Place 1 drop into both eyes daily. , Disp: , Rfl: 11 .  Acetaminophen (TYLENOL) 325 MG CAPS, Take 650 mg by mouth daily as needed (for shoulder pain). , Disp: , Rfl:  .  diclofenac sodium (VOLTAREN) 1 % GEL, Apply 2 g topically 3 (three) times daily as needed (pain, inflammation). (Patient not taking: Reported on 03/26/2019), Disp: 100 g, Rfl: 0 .  docusate sodium (COLACE) 100 MG capsule, Take 1 capsule (100 mg total) by mouth every 12 (twelve) hours. (Patient  not taking: Reported on 03/26/2019), Disp: 60 capsule, Rfl: 0 .  metolazone (ZAROXOLYN) 2.5 MG tablet, Take only as directed by the CHF clinic. (Patient not taking: Reported on 03/26/2019), Disp: 5 tablet, Rfl: 3 .  NON FORMULARY, impax 4 mg daily at bedtime, Disp: , Rfl:  Allergies  Allergen Reactions  . Hydrocodone Other (See Comments)    Took away all energy and made her stop eating food for short time  . Percocet  [Oxycodone-Acetaminophen] Other (See Comments)    Too away all energy and made her stop eating food for short time  . Eggs Or Egg-Derived Products Hives and Rash  . Mercurial Derivatives Itching and Rash  . Penicillins Hives and Rash    Has patient had a PCN reaction causing immediate rash, facial/tongue/throat swelling, SOB or lightheadedness with hypotension: Yes Has patient had a PCN reaction causing severe rash involving mucus membranes or skin necrosis: No Has patient had a PCN reaction that required hospitalization: No Has patient had a PCN reaction occurring within the last 10 years: Yes If all of the above answers are "NO", then may proceed with Cephalosporin use.  . Quinine Derivatives Hives and Rash     Social History   Socioeconomic History  . Marital status: Widowed    Spouse name: Not on file  . Number of children: 0  . Years of education: Not on file  . Highest education level: Doctorate  Occupational History  . Occupation: retired  Scientific laboratory technician  . Financial resource strain: Not hard at all  . Food insecurity    Worry: Never true    Inability: Never true  . Transportation needs    Medical: No    Non-medical: No  Tobacco Use  . Smoking status: Former Smoker    Packs/day: 1.00    Years: 45.00    Pack years: 45.00    Types: Cigarettes    Quit date: 07/24/1978    Years since quitting: 40.6  . Smokeless tobacco: Never Used  Substance and Sexual Activity  . Alcohol use: Yes    Alcohol/week: 7.0 standard drinks    Types: 7 Glasses of wine per week    Comment: every day  . Drug use: No  . Sexual activity: Not Currently  Lifestyle  . Physical activity    Days per week: Not on file    Minutes per session: Not on file  . Stress: Not on file  Relationships  . Social Herbalist on phone: Not on file    Gets together: Not on file    Attends religious service: Not on file    Active member of club or organization: Not on file    Attends meetings of  clubs or organizations: Not on file    Relationship status: Not on file  . Intimate partner violence    Fear of current or ex partner: Not on file    Emotionally abused: Not on file    Physically abused: Not on file    Forced sexual activity: Not on file  Other Topics Concern  . Not on file  Social History Narrative   Lives alone    Physical Exam Vitals signs reviewed.  HENT:     Head: Normocephalic.     Nose: Nose normal.     Mouth/Throat:     Mouth: Mucous membranes are moist.  Eyes:     Pupils: Pupils are equal, round, and reactive to light.  Neck:     Musculoskeletal:  Normal range of motion.  Cardiovascular:     Rate and Rhythm: Normal rate.     Pulses: Normal pulses.  Pulmonary:     Effort: Pulmonary effort is normal.     Breath sounds: Normal breath sounds.  Abdominal:     General: Abdomen is flat.     Palpations: Abdomen is soft.  Musculoskeletal: Normal range of motion.     Right lower leg: Edema present.     Left lower leg: Edema present.  Skin:    General: Skin is warm and dry.     Capillary Refill: Capillary refill takes less than 2 seconds.  Neurological:     Mental Status: She is alert. Mental status is at baseline.  Psychiatric:        Mood and Affect: Mood normal.    Arrived for home visit for [facility]. Kennyth Lose reports feeling "good" with no complaints today. Kennyth Lose tells me she has not been short of breath and has been maintaining her weight a long with having semi-regular bowel movements. Kennyth Lose states she has been doing well. She stated she had an appointment with her foot doctor and she goes tomorrow for a circulation exam. Medications were reviewed and verified. Pill box was filled. Vitals were obtained and are as recorded. Patient agreed to home visit in one week. Home visit complete.    MEDS ORDERED: POTASSIUM  Future Appointments  Date Time Provider [place]  03/28/2019  1:00 PM [facility] VASC 2 [facility]  04/02/2019 10:45 AM Amalia Hailey  Dorathy Daft, DPM [facility]     ACTION: Home visit completed Next visit planned for ONE WEEK

## 2019-03-27 ENCOUNTER — Ambulatory Visit (HOSPITAL_COMMUNITY)
Admission: RE | Admit: 2019-03-27 | Payer: Medicare HMO | Source: Ambulatory Visit | Attending: Podiatry | Admitting: Podiatry

## 2019-03-28 ENCOUNTER — Ambulatory Visit (HOSPITAL_COMMUNITY)
Admission: RE | Admit: 2019-03-28 | Discharge: 2019-03-28 | Disposition: A | Discharge status: Home / Self Care | Payer: Medicare HMO | Source: Ambulatory Visit | Attending: Internal Medicine | Admitting: Internal Medicine

## 2019-03-28 ENCOUNTER — Other Ambulatory Visit: Payer: Self-pay

## 2019-03-28 DIAGNOSIS — M79672 Pain in left foot: Secondary | ICD-10-CM | POA: Diagnosis not present

## 2019-03-28 DIAGNOSIS — R0989 Other specified symptoms and signs involving the circulatory and respiratory systems: Secondary | ICD-10-CM | POA: Diagnosis not present

## 2019-03-28 DIAGNOSIS — I739 Peripheral vascular disease, unspecified: Secondary | ICD-10-CM

## 2019-03-28 DIAGNOSIS — M79671 Pain in right foot: Secondary | ICD-10-CM

## 2019-03-28 DIAGNOSIS — R600 Localized edema: Secondary | ICD-10-CM | POA: Diagnosis not present

## 2019-03-28 NOTE — Progress Notes (Signed)
   SUBJECTIVE Patient with a history of diabetes mellitus presents to office today complaining of elongated, thickened nails that cause pain while ambulating in shoes. She reports associated swelling of the lower extremities. She is unable to trim her own nails. Patient is here for further evaluation and treatment.   Past Medical History:  Diagnosis Date  . Anemia    occassionally  . Arthritis    all over.  . Asthma    Allergixc reaction to cats.  . Atrial fibrillation ([place])   . Bowel obstruction ([place])   . Chronic systolic CHF (congestive heart failure) (HCC)    EF 30-35% by cath, echo 2016 EF 50%  . Complete heart block (HCC)    a. s/p MDT CRTP pacemaker  . DCM (dilated cardiomyopathy) ([place]) 08/16/2014   normal coronary arteries on cath with EF 30-45%.  EF now 50% by echo 11/2014  . Diabetes mellitus 2006   Diet and exercise controlled.  Marland Kitchen Dyspnea   . GERD (gastroesophageal reflux disease)    occ  . Glaucoma 08/17/2018  . Hypertension   . Left tibial fracture 2007  . OSA (obstructive sleep apnea) 10/23/2014   Moderate with AHI 21/hr  . Osteoarthritis of right shoulder region 06/26/2013  . Stroke [facility])     OBJECTIVE General Patient is awake, alert, and oriented x 3 and in no acute distress. Derm Skin is dry and supple bilateral. Negative open lesions or macerations. Remaining integument unremarkable. Nails are tender, long, thickened and dystrophic with subungual debris, consistent with onychomycosis, 1-5 bilateral. No signs of infection noted. Vasc  Bilateral lower extremity edema noted. DP and PT pedal pulses diminished bilaterally. Skin is cool to the touch to bilateral lower extremities.  Neuro Epicritic and protective threshold sensation diminished bilaterally.  Musculoskeletal Exam No symptomatic pedal deformities noted bilateral. Muscular strength within normal limits.  Radiographic Exam:  Normal osseous mineralization. Joint spaces preserved. No  fracture/dislocation/boney destruction.    ASSESSMENT 1. Diabetes Mellitus w/ peripheral neuropathy 2. Onychomycosis of nail due to dermatophyte bilateral 3. BLE edema   PLAN OF CARE 1. Patient evaluated today. X-Rays reviewed.  2. Instructed to maintain good pedal hygiene and foot care. Stressed importance of controlling blood sugar.  3. Mechanical debridement of nails 1-5 bilaterally performed using a nail nipper. Filed with dremel without incident.  4. Arterial dopplers bilaterally.  5. Return to clinic in 2 weeks.     Edrick Kins, DPM [facility]  Dr. Edrick Kins, [address]                                        [place], [place] [address]                Office [phone]  Fax [phone]

## 2019-03-29 ENCOUNTER — Telehealth (HOSPITAL_COMMUNITY): Payer: Self-pay | Admitting: Licensed Clinical Social Worker

## 2019-03-29 DIAGNOSIS — I509 Heart failure, unspecified: Secondary | ICD-10-CM | POA: Diagnosis not present

## 2019-03-29 DIAGNOSIS — J9601 Acute respiratory failure with hypoxia: Secondary | ICD-10-CM | POA: Diagnosis not present

## 2019-03-29 DIAGNOSIS — G4733 Obstructive sleep apnea (adult) (pediatric): Secondary | ICD-10-CM | POA: Diagnosis not present

## 2019-03-29 DIAGNOSIS — J45909 Unspecified asthma, uncomplicated: Secondary | ICD-10-CM | POA: Diagnosis not present

## 2019-03-29 DIAGNOSIS — R0600 Dyspnea, unspecified: Secondary | ICD-10-CM | POA: Diagnosis not present

## 2019-03-29 NOTE — Telephone Encounter (Signed)
CSW called patient pharmacy to check if she has now reached out of pocket requirement for [employer].  Pharmacy reports that pt has now spent $642 out of pocket which is over the copay requirement- pharmacy faxed CSW documentation of out of pocket expenses and CSW faxed to [place] for review.  CSW updated pt granddaughter and will inform when we hear from [place].  CSW will continue to follow and assist as needed  Jorge Ny, [facility] Desk#: [phone] Cell#: [phone]

## 2019-04-02 ENCOUNTER — Other Ambulatory Visit: Payer: Self-pay

## 2019-04-02 ENCOUNTER — Ambulatory Visit: Payer: Medicare HMO | Admitting: Podiatry

## 2019-04-02 ENCOUNTER — Other Ambulatory Visit (HOSPITAL_COMMUNITY): Payer: Self-pay

## 2019-04-02 VITALS — Temp 98.2°F

## 2019-04-02 DIAGNOSIS — M79672 Pain in left foot: Secondary | ICD-10-CM

## 2019-04-02 DIAGNOSIS — R0989 Other specified symptoms and signs involving the circulatory and respiratory systems: Secondary | ICD-10-CM | POA: Diagnosis not present

## 2019-04-02 DIAGNOSIS — M79671 Pain in right foot: Secondary | ICD-10-CM

## 2019-04-02 DIAGNOSIS — R600 Localized edema: Secondary | ICD-10-CM | POA: Diagnosis not present

## 2019-04-02 NOTE — Progress Notes (Signed)
Paramedicine Encounter    Patient ID: Kim Mullins, female    DOB: 05-22-1930, 85 y.o.   MRN: [id number]   Patient Care Team: Lin Landsman, MD as PCP - General (Family Medicine) Larey Dresser, MD as PCP - Advanced Heart Failure (Cardiology) Jorge Ny, LCSW as Social Worker (Licensed Clinical Social Worker)  Patient Active Problem List   Diagnosis Date Noted  . Obesity (BMI 30-39.9) 10/20/2018  . Acute on chronic heart failure ([place]) 08/19/2018  . Palliative care encounter   . Palliative care by specialist   . Acute respiratory failure with hypoxia ([place]) 08/18/2018  . Diabetes ([place]) 08/18/2018  . CKD (chronic kidney disease) 08/18/2018  . Amyloidosis ([place]) 08/18/2018  . Acute on chronic combined systolic and diastolic heart failure ([place]) 08/17/2018  . GERD (gastroesophageal reflux disease) 08/17/2018  . Elevated troponin 08/17/2018  . Glaucoma 08/17/2018  . Hyperbilirubinemia 08/17/2018  . CHF (congestive heart failure) ([place]) 07/18/2018  . Gait abnormality 05/03/2017  . Hypotension 04/14/2016  . Hypotension due to drugs   . Chronic anticoagulation   . TIA (transient ischemic attack) 04/12/2016  . UTI (lower urinary tract infection) 04/12/2016  . AKI (acute kidney injury) ([place]) 04/12/2016  . Atrial fibrillation ([place]) 09/12/2015  . Occipital infarction ([place])   . History of stroke 06/18/2015  . Homonymous hemianopsia   . Pacemaker 01/21/2015  . OSA (obstructive sleep apnea) 10/23/2014  . Dizziness 10/21/2014  . Complete heart block ([place]) 10/06/2014  . Benign essential HTN 10/04/2014  . Acute on chronic systolic CHF (congestive heart failure) ([place]) 10/04/2014  . Abnormal cardiovascular stress test 08/27/2014  . Pulmonary HTN ([place]) 08/16/2014  . DCM (dilated cardiomyopathy) ([place]) 08/16/2014  . Cough 07/13/2014  . Esophageal stricture 06/11/2014  . Nonspecific (abnormal) findings on radiological and other examination of gastrointestinal tract 05/29/2014  .  Dyspnea 04/29/2014  . Osteoarthritis of right shoulder region 06/26/2013  . DJD (degenerative joint disease) of hip 11/16/2011    Class: Present on Admission    Current Outpatient Medications:  .  apixaban (ELIQUIS) 2.5 MG TABS tablet, Take 1 tablet (2.5 mg total) by mouth 2 (two) times daily., Disp: 60 tablet, Rfl: 11 .  bumetanide (BUMEX) 2 MG tablet, Take 2 tablets (4 mg total) by mouth 2 (two) times daily. Take additional dose on Sunday if weight 149lbs or greater., Disp: 60 tablet, Rfl: 6 .  fexofenadine (ALLEGRA) 180 MG tablet, Take 180 mg by mouth daily., Disp: , Rfl:  .  glucose blood test strip, Use to test blood sugar twice daily (Accuchek Smartview), Disp: 100 each, Rfl: 0 .  latanoprost (XALATAN) 0.005 % ophthalmic solution, Place 1 drop into both eyes at bedtime. , Disp: , Rfl: 11 .  OXYGEN, Inhale 2 L into the lungs., Disp: , Rfl:  .  potassium chloride SA (K-DUR) 20 MEQ tablet, Take 2 tablets (40 mEq total) by mouth 2 (two) times daily., Disp: 120 tablet, Rfl: 5 .  timolol (TIMOPTIC) 0.5 % ophthalmic solution, Place 1 drop into both eyes daily. , Disp: , Rfl: 11 .  Acetaminophen (TYLENOL) 325 MG CAPS, Take 650 mg by mouth daily as needed (for shoulder pain). , Disp: , Rfl:  .  diclofenac sodium (VOLTAREN) 1 % GEL, Apply 2 g topically 3 (three) times daily as needed (pain, inflammation). (Patient not taking: Reported on 03/26/2019), Disp: 100 g, Rfl: 0 .  docusate sodium (COLACE) 100 MG capsule, Take 1 capsule (100 mg total) by mouth every 12 (twelve) hours. (Patient  not taking: Reported on 03/26/2019), Disp: 60 capsule, Rfl: 0 .  metolazone (ZAROXOLYN) 2.5 MG tablet, Take only as directed by the CHF clinic. (Patient not taking: Reported on 03/26/2019), Disp: 5 tablet, Rfl: 3 .  NON FORMULARY, impax 4 mg daily at bedtime, Disp: , Rfl:  Allergies  Allergen Reactions  . Hydrocodone Other (See Comments)    Took away all energy and made her stop eating food for short time  . Percocet  [Oxycodone-Acetaminophen] Other (See Comments)    Too away all energy and made her stop eating food for short time  . Eggs Or Egg-Derived Products Hives and Rash  . Mercurial Derivatives Itching and Rash  . Penicillins Hives and Rash    Has patient had a PCN reaction causing immediate rash, facial/tongue/throat swelling, SOB or lightheadedness with hypotension: Yes Has patient had a PCN reaction causing severe rash involving mucus membranes or skin necrosis: No Has patient had a PCN reaction that required hospitalization: No Has patient had a PCN reaction occurring within the last 10 years: Yes If all of the above answers are "NO", then may proceed with Cephalosporin use.  . Quinine Derivatives Hives and Rash     Social History   Socioeconomic History  . Marital status: Widowed    Spouse name: Not on file  . Number of children: 0  . Years of education: Not on file  . Highest education level: Doctorate  Occupational History  . Occupation: retired  Scientific laboratory technician  . Financial resource strain: Not hard at all  . Food insecurity    Worry: Never true    Inability: Never true  . Transportation needs    Medical: No    Non-medical: No  Tobacco Use  . Smoking status: Former Smoker    Packs/day: 1.00    Years: 45.00    Pack years: 45.00    Types: Cigarettes    Quit date: 07/24/1978    Years since quitting: 40.7  . Smokeless tobacco: Never Used  Substance and Sexual Activity  . Alcohol use: Yes    Alcohol/week: 7.0 standard drinks    Types: 7 Glasses of wine per week    Comment: every day  . Drug use: No  . Sexual activity: Not Currently  Lifestyle  . Physical activity    Days per week: Not on file    Minutes per session: Not on file  . Stress: Not on file  Relationships  . Social Herbalist on phone: Not on file    Gets together: Not on file    Attends religious service: Not on file    Active member of club or organization: Not on file    Attends meetings of  clubs or organizations: Not on file    Relationship status: Not on file  . Intimate partner violence    Fear of current or ex partner: Not on file    Emotionally abused: Not on file    Physically abused: Not on file    Forced sexual activity: Not on file  Other Topics Concern  . Not on file  Social History Narrative   Lives alone    Physical Exam Vitals signs reviewed.  HENT:     Head: Normocephalic.     Nose: Nose normal.     Mouth/Throat:     Mouth: Mucous membranes are moist.  Eyes:     Pupils: Pupils are equal, round, and reactive to light.  Neck:     Musculoskeletal:  Normal range of motion.  Cardiovascular:     Rate and Rhythm: Normal rate and regular rhythm.     Pulses: Normal pulses.     Heart sounds: Normal heart sounds.  Pulmonary:     Effort: Pulmonary effort is normal.     Breath sounds: Normal breath sounds.  Abdominal:     General: Abdomen is flat.     Palpations: Abdomen is soft.  Musculoskeletal: Normal range of motion.     Right lower leg: No edema.     Left lower leg: No edema.  Skin:    General: Skin is warm and dry.     Capillary Refill: Capillary refill takes less than 2 seconds.  Neurological:     Mental Status: She is alert. Mental status is at baseline.  Psychiatric:        Mood and Affect: Mood normal.     Arrived for home visit for [facility]. Kennyth Lose is currently staying at the [facility] [place] due to her A/C being out at home. She says it's projected to be done in a day or two. Kennyth Lose reports feeling good. I noticed she did not have her oxygen with her or her CPAP machine. She stated she felt fine without it and thinks she can wait to get it when she gets back home. Patient did not appear to be short of breath, lung sounds were clear. Patient was advised I would be contacting the SW in regards to getting her portable oxygen. Patient's medications were reviewed and verified. Pill box was filled. Patient needing her night time Bumex in pill box.  Granddaughter made aware of same she stated she would place them in her pill box as well as placing next weeks in pill box due to me being gone next Monday. Patient's vitals as noted. Patient understood to contact me if she had any questions or concerns. Home visit complete.   CBG- [id number]     Future Appointments  Date Time Provider [place]  07/04/2019 11:15 AM Gardiner Barefoot, DPM [facility]     ACTION: Home visit completed

## 2019-04-04 NOTE — Progress Notes (Signed)
   SUBJECTIVE Patient with a history of diabetes mellitus presents to office today for follow up evaluation of bilateral lower extremity edema. She states she had the lower venous study done bilaterally and is doing a little better today. There are no modifying factors noted. Patient is here for further evaluation and treatment.   Past Medical History:  Diagnosis Date  . Anemia    occassionally  . Arthritis    all over.  . Asthma    Allergixc reaction to cats.  . Atrial fibrillation ([place])   . Bowel obstruction ([place])   . Chronic systolic CHF (congestive heart failure) (HCC)    EF 30-35% by cath, echo 2016 EF 50%  . Complete heart block ([facility])    a. s/p MDT CRTP pacemaker  . DCM (dilated cardiomyopathy) ([place]) 08/16/2014   normal coronary arteries on cath with EF 30-45%.  EF now 50% by echo 11/2014  . Diabetes mellitus 2006   Diet and exercise controlled.  [place] Kitchen Dyspnea   . GERD (gastroesophageal reflux disease)    occ  . Glaucoma 08/17/2018  . Hypertension   . Left tibial fracture 2007  . OSA (obstructive sleep apnea) 10/23/2014   Moderate with AHI 21/hr  . Osteoarthritis of right shoulder region 06/26/2013  . Stroke [facility])     OBJECTIVE General Patient is awake, alert, and oriented x 3 and in no acute distress. Derm Skin is dry and supple bilateral. Negative open lesions or macerations. Remaining integument unremarkable. Nails are tender, long, thickened and dystrophic with subungual debris, consistent with onychomycosis, 1-5 bilateral. No signs of infection noted. Vasc  Bilateral lower extremity edema noted. DP and PT pedal pulses diminished bilaterally. Skin is cool to the touch to bilateral lower extremities.  Neuro Epicritic and protective threshold sensation diminished bilaterally.  Musculoskeletal Exam No symptomatic pedal deformities noted bilateral. Muscular strength within normal limits.  ASSESSMENT 1. Diabetes Mellitus w/ peripheral neuropathy 2. BLE edema   PLAN OF  CARE 1. Patient evaluated today.  2. Vascular dopplers reviewed bilaterally.  3. Compression anklets dispensed bilaterally.  4. Return to clinic in 3 months for routine care.    Edrick Kins, DPM [facility]  Dr. Edrick Kins, [address]                                        [place], [place] [address]                Office [phone]  Fax [phone]

## 2019-04-05 ENCOUNTER — Telehealth (HOSPITAL_COMMUNITY): Payer: Self-pay | Admitting: Pharmacy Technician

## 2019-04-05 NOTE — Telephone Encounter (Signed)
Patient was approved to receive Eliquis from [employer] through [employer]. Called and [facility] was unable to reach patient. Gave them granddaughter's phone number for future reference as primary number.  Called and spoke with granddaughter. Gave her phone number to [place] [phone]) for her to call and set up first shipment of Eliquis.  Charlann Boxer, CPhT

## 2019-04-09 ENCOUNTER — Telehealth: Payer: Self-pay | Admitting: Podiatry

## 2019-04-09 NOTE — Telephone Encounter (Signed)
Pt's granddaughter called saying she was given compression hose. Stated she needs help getting them on and off but someone is not always with her.

## 2019-04-10 ENCOUNTER — Telehealth: Payer: Self-pay | Admitting: *Deleted

## 2019-04-10 NOTE — Telephone Encounter (Signed)
sent to to Dr Prudence Davidson in error

## 2019-04-11 ENCOUNTER — Telehealth: Payer: Self-pay

## 2019-04-11 NOTE — Telephone Encounter (Signed)
Called pt in regards to compression sleeves dispensed last visit; "I'm having a hard time putting them on myself and I don't have anybody to help me. I seen some compression stockings with zipper on them, can I use those?"; Informed patient to discontinue compression sleeves, could either come in to get wrapped by doctor or to talk to doctor about other solutions or to try other socks until next appt in November.; "I will try other socks, thank you"

## 2019-04-16 ENCOUNTER — Other Ambulatory Visit (HOSPITAL_COMMUNITY): Payer: Self-pay

## 2019-04-16 NOTE — Progress Notes (Signed)
Paramedicine Encounter    Patient ID: Kim Mullins, female    DOB: 02-03-1930, 85 y.o.   MRN: [id number]   Patient Care Team: Lin Landsman, MD as PCP - General (Family Medicine) Larey Dresser, MD as PCP - Advanced Heart Failure (Cardiology) Jorge Ny, LCSW as Social Worker (Licensed Clinical Social Worker)  Patient Active Problem List   Diagnosis Date Noted  . Obesity (BMI 30-39.9) 10/20/2018  . Acute on chronic heart failure ([place]) 08/19/2018  . Palliative care encounter   . Palliative care by specialist   . Acute respiratory failure with hypoxia ([place]) 08/18/2018  . Diabetes ([place]) 08/18/2018  . CKD (chronic kidney disease) 08/18/2018  . Amyloidosis ([place]) 08/18/2018  . Acute on chronic combined systolic and diastolic heart failure ([place]) 08/17/2018  . GERD (gastroesophageal reflux disease) 08/17/2018  . Elevated troponin 08/17/2018  . Glaucoma 08/17/2018  . Hyperbilirubinemia 08/17/2018  . CHF (congestive heart failure) ([place]) 07/18/2018  . Gait abnormality 05/03/2017  . Hypotension 04/14/2016  . Hypotension due to drugs   . Chronic anticoagulation   . TIA (transient ischemic attack) 04/12/2016  . UTI (lower urinary tract infection) 04/12/2016  . AKI (acute kidney injury) ([place]) 04/12/2016  . Atrial fibrillation ([place]) 09/12/2015  . Occipital infarction ([place])   . History of stroke 06/18/2015  . Homonymous hemianopsia   . Pacemaker 01/21/2015  . OSA (obstructive sleep apnea) 10/23/2014  . Dizziness 10/21/2014  . Complete heart block ([place]) 10/06/2014  . Benign essential HTN 10/04/2014  . Acute on chronic systolic CHF (congestive heart failure) ([place]) 10/04/2014  . Abnormal cardiovascular stress test 08/27/2014  . Pulmonary HTN ([place]) 08/16/2014  . DCM (dilated cardiomyopathy) ([place]) 08/16/2014  . Cough 07/13/2014  . Esophageal stricture 06/11/2014  . Nonspecific (abnormal) findings on radiological and other examination of gastrointestinal tract 05/29/2014  .  Dyspnea 04/29/2014  . Osteoarthritis of right shoulder region 06/26/2013  . DJD (degenerative joint disease) of hip 11/16/2011    Class: Present on Admission    Current Outpatient Medications:  .  apixaban (ELIQUIS) 2.5 MG TABS tablet, Take 1 tablet (2.5 mg total) by mouth 2 (two) times daily., Disp: 60 tablet, Rfl: 11 .  bumetanide (BUMEX) 2 MG tablet, Take 2 tablets (4 mg total) by mouth 2 (two) times daily. Take additional dose on Sunday if weight 149lbs or greater., Disp: 60 tablet, Rfl: 6 .  fexofenadine (ALLEGRA) 180 MG tablet, Take 180 mg by mouth daily., Disp: , Rfl:  .  glucose blood test strip, Use to test blood sugar twice daily (Accuchek Smartview), Disp: 100 each, Rfl: 0 .  latanoprost (XALATAN) 0.005 % ophthalmic solution, Place 1 drop into both eyes at bedtime. , Disp: , Rfl: 11 .  metolazone (ZAROXOLYN) 2.5 MG tablet, Take only as directed by the CHF clinic., Disp: 5 tablet, Rfl: 3 .  potassium chloride SA (K-DUR) 20 MEQ tablet, Take 2 tablets (40 mEq total) by mouth 2 (two) times daily., Disp: 120 tablet, Rfl: 5 .  timolol (TIMOPTIC) 0.5 % ophthalmic solution, Place 1 drop into both eyes daily. , Disp: , Rfl: 11 .  Acetaminophen (TYLENOL) 325 MG CAPS, Take 650 mg by mouth daily as needed (for shoulder pain). , Disp: , Rfl:  .  diclofenac sodium (VOLTAREN) 1 % GEL, Apply 2 g topically 3 (three) times daily as needed (pain, inflammation). (Patient not taking: Reported on 03/26/2019), Disp: 100 g, Rfl: 0 .  docusate sodium (COLACE) 100 MG capsule, Take 1 capsule (100 mg total)  by mouth every 12 (twelve) hours. (Patient not taking: Reported on 03/26/2019), Disp: 60 capsule, Rfl: 0 .  NON FORMULARY, impax 4 mg daily at bedtime, Disp: , Rfl:  .  OXYGEN, Inhale 2 L into the lungs., Disp: , Rfl:  Allergies  Allergen Reactions  . Hydrocodone Other (See Comments)    Took away all energy and made her stop eating food for short time  . Percocet [Oxycodone-Acetaminophen] Other (See  Comments)    Too away all energy and made her stop eating food for short time  . Eggs Or Egg-Derived Products Hives and Rash  . Mercurial Derivatives Itching and Rash  . Penicillins Hives and Rash    Has patient had a PCN reaction causing immediate rash, facial/tongue/throat swelling, SOB or lightheadedness with hypotension: Yes Has patient had a PCN reaction causing severe rash involving mucus membranes or skin necrosis: No Has patient had a PCN reaction that required hospitalization: No Has patient had a PCN reaction occurring within the last 10 years: Yes If all of the above answers are "NO", then may proceed with Cephalosporin use.  . Quinine Derivatives Hives and Rash     Social History   Socioeconomic History  . Marital status: Widowed    Spouse name: Not on file  . Number of children: 0  . Years of education: Not on file  . Highest education level: Doctorate  Occupational History  . Occupation: retired  Scientific laboratory technician  . Financial resource strain: Not hard at all  . Food insecurity    Worry: Never true    Inability: Never true  . Transportation needs    Medical: No    Non-medical: No  Tobacco Use  . Smoking status: Former Smoker    Packs/day: 1.00    Years: 45.00    Pack years: 45.00    Types: Cigarettes    Quit date: 07/24/1978    Years since quitting: 40.7  . Smokeless tobacco: Never Used  Substance and Sexual Activity  . Alcohol use: Yes    Alcohol/week: 7.0 standard drinks    Types: 7 Glasses of wine per week    Comment: every day  . Drug use: No  . Sexual activity: Not Currently  Lifestyle  . Physical activity    Days per week: Not on file    Minutes per session: Not on file  . Stress: Not on file  Relationships  . Social Herbalist on phone: Not on file    Gets together: Not on file    Attends religious service: Not on file    Active member of club or organization: Not on file    Attends meetings of clubs or organizations: Not on file     Relationship status: Not on file  . Intimate partner violence    Fear of current or ex partner: Not on file    Emotionally abused: Not on file    Physically abused: Not on file    Forced sexual activity: Not on file  Other Topics Concern  . Not on file  Social History Narrative   Lives alone    Physical Exam Vitals signs reviewed.  Constitutional:      Appearance: She is normal weight.  HENT:     Head: Normocephalic.     Nose: Nose normal.     Mouth/Throat:     Mouth: Mucous membranes are moist.  Neck:     Musculoskeletal: Normal range of motion.  Cardiovascular:  Rate and Rhythm: Normal rate.     Comments: Faint pulse Musculoskeletal: Normal range of motion.  Neurological:     Mental Status: She is alert.      Arrived for home visit with Kennyth Lose who was seated in her walker alert and oriented with no complaints. Patient was not wearing her oxygen upon assessment. She reports she doesn't need it all the time and that she feels good today. Kennyth Lose did not appear short of breath and lung sounds were clear on assessment. Vitals as recorded. Pills were reviewed and verified. Pill box filled. Patient's granddaughter reports patient having a weight gain last week and was having increased shortness of breath so she elected to give Kennyth Lose a Metolazone and Jackie's weight decreased over night and she had no issues with shortness of breath on the next day. Today patient's weight was 127lbs. Granddaughter reports patient's weight being up to 138lbs then after metolazone 132lbs. Patient reports having a normal diet and normal bowel movements. Patient noted to have some swelling. Patient reports having been given compression socks by her podiatrist. Same were placed on patient. Patient and family was advised to contact me if any changes were noted and to contact me if weight gain happened again. Patient and granddaughter understood same. Patient agreed to home visit in one week.    Meds  ordered: Bumex 2mg     Weight- 127lbs CBG- 173    Future Appointments  Date Time Provider [place]  07/04/2019 11:15 AM Gardiner Barefoot, DPM [facility]     ACTION: Home visit completed Next visit planned for one week

## 2019-04-23 ENCOUNTER — Other Ambulatory Visit (HOSPITAL_COMMUNITY): Payer: Self-pay

## 2019-04-23 ENCOUNTER — Telehealth (HOSPITAL_COMMUNITY): Payer: Self-pay | Admitting: *Deleted

## 2019-04-23 NOTE — Progress Notes (Signed)
Paramedicine Encounter    Patient ID: Kim Gerstel, female    DOB: 01-20-1930, 85 y.o.   MRN: [id number]   Patient Care Team: Lin Landsman, MD as PCP - General (Family Medicine) Larey Dresser, MD as PCP - Advanced Heart Failure (Cardiology) Jorge Ny, LCSW as Social Worker (Licensed Clinical Social Worker)  Patient Active Problem List   Diagnosis Date Noted  . Obesity (BMI 30-39.9) 10/20/2018  . Acute on chronic heart failure ([place]) 08/19/2018  . Palliative care encounter   . Palliative care by specialist   . Acute respiratory failure with hypoxia ([place]) 08/18/2018  . Diabetes ([place]) 08/18/2018  . CKD (chronic kidney disease) 08/18/2018  . Amyloidosis ([place]) 08/18/2018  . Acute on chronic combined systolic and diastolic heart failure ([place]) 08/17/2018  . GERD (gastroesophageal reflux disease) 08/17/2018  . Elevated troponin 08/17/2018  . Glaucoma 08/17/2018  . Hyperbilirubinemia 08/17/2018  . CHF (congestive heart failure) ([place]) 07/18/2018  . Gait abnormality 05/03/2017  . Hypotension 04/14/2016  . Hypotension due to drugs   . Chronic anticoagulation   . TIA (transient ischemic attack) 04/12/2016  . UTI (lower urinary tract infection) 04/12/2016  . AKI (acute kidney injury) ([place]) 04/12/2016  . Atrial fibrillation ([place]) 09/12/2015  . Occipital infarction ([place])   . History of stroke 06/18/2015  . Homonymous hemianopsia   . Pacemaker 01/21/2015  . OSA (obstructive sleep apnea) 10/23/2014  . Dizziness 10/21/2014  . Complete heart block ([place]) 10/06/2014  . Benign essential HTN 10/04/2014  . Acute on chronic systolic CHF (congestive heart failure) ([place]) 10/04/2014  . Abnormal cardiovascular stress test 08/27/2014  . Pulmonary HTN ([place]) 08/16/2014  . DCM (dilated cardiomyopathy) ([place]) 08/16/2014  . Cough 07/13/2014  . Esophageal stricture 06/11/2014  . Nonspecific (abnormal) findings on radiological and other examination of gastrointestinal tract 05/29/2014  .  Dyspnea 04/29/2014  . Osteoarthritis of right shoulder region 06/26/2013  . DJD (degenerative joint disease) of hip 11/16/2011    Class: Present on Admission    Current Outpatient Medications:  .  apixaban (ELIQUIS) 2.5 MG TABS tablet, Take 1 tablet (2.5 mg total) by mouth 2 (two) times daily., Disp: 60 tablet, Rfl: 11 .  bumetanide (BUMEX) 2 MG tablet, Take 2 tablets (4 mg total) by mouth 2 (two) times daily. Take additional dose on Sunday if weight 149lbs or greater., Disp: 60 tablet, Rfl: 6 .  latanoprost (XALATAN) 0.005 % ophthalmic solution, Place 1 drop into both eyes at bedtime. , Disp: , Rfl: 11 .  metolazone (ZAROXOLYN) 2.5 MG tablet, Take only as directed by the CHF clinic., Disp: 5 tablet, Rfl: 3 .  OXYGEN, Inhale 2 L into the lungs., Disp: , Rfl:  .  timolol (TIMOPTIC) 0.5 % ophthalmic solution, Place 1 drop into both eyes daily. , Disp: , Rfl: 11 .  Acetaminophen (TYLENOL) 325 MG CAPS, Take 650 mg by mouth daily as needed (for shoulder pain). , Disp: , Rfl:  .  diclofenac sodium (VOLTAREN) 1 % GEL, Apply 2 g topically 3 (three) times daily as needed (pain, inflammation). (Patient not taking: Reported on 03/26/2019), Disp: 100 g, Rfl: 0 .  docusate sodium (COLACE) 100 MG capsule, Take 1 capsule (100 mg total) by mouth every 12 (twelve) hours. (Patient not taking: Reported on 03/26/2019), Disp: 60 capsule, Rfl: 0 .  fexofenadine (ALLEGRA) 180 MG tablet, Take 180 mg by mouth daily., Disp: , Rfl:  .  glucose blood test strip, Use to test blood sugar twice daily (Accuchek Smartview) (Patient  not taking: Reported on 04/23/2019), Disp: 100 each, Rfl: 0 .  NON FORMULARY, impax 4 mg daily at bedtime, Disp: , Rfl:  .  potassium chloride SA (K-DUR) 20 MEQ tablet, Take 2 tablets (40 mEq total) by mouth 2 (two) times daily., Disp: 120 tablet, Rfl: 5 Allergies  Allergen Reactions  . Hydrocodone Other (See Comments)    Took away all energy and made her stop eating food for short time  . Percocet  [Oxycodone-Acetaminophen] Other (See Comments)    Too away all energy and made her stop eating food for short time  . Eggs Or Egg-Derived Products Hives and Rash  . Mercurial Derivatives Itching and Rash  . Penicillins Hives and Rash    Has patient had a PCN reaction causing immediate rash, facial/tongue/throat swelling, SOB or lightheadedness with hypotension: Yes Has patient had a PCN reaction causing severe rash involving mucus membranes or skin necrosis: No Has patient had a PCN reaction that required hospitalization: No Has patient had a PCN reaction occurring within the last 10 years: Yes If all of the above answers are "NO", then may proceed with Cephalosporin use.  . Quinine Derivatives Hives and Rash     Social History   Socioeconomic History  . Marital status: Widowed    Spouse name: Not on file  . Number of children: 0  . Years of education: Not on file  . Highest education level: Doctorate  Occupational History  . Occupation: retired  Scientific laboratory technician  . Financial resource strain: Not hard at all  . Food insecurity    Worry: Never true    Inability: Never true  . Transportation needs    Medical: No    Non-medical: No  Tobacco Use  . Smoking status: Former Smoker    Packs/day: 1.00    Years: 45.00    Pack years: 45.00    Types: Cigarettes    Quit date: 07/24/1978    Years since quitting: 40.7  . Smokeless tobacco: Never Used  Substance and Sexual Activity  . Alcohol use: Yes    Alcohol/week: 7.0 standard drinks    Types: 7 Glasses of wine per week    Comment: every day  . Drug use: No  . Sexual activity: Not Currently  Lifestyle  . Physical activity    Days per week: Not on file    Minutes per session: Not on file  . Stress: Not on file  Relationships  . Social Herbalist on phone: Not on file    Gets together: Not on file    Attends religious service: Not on file    Active member of club or organization: Not on file    Attends meetings of  clubs or organizations: Not on file    Relationship status: Not on file  . Intimate partner violence    Fear of current or ex partner: Not on file    Emotionally abused: Not on file    Physically abused: Not on file    Forced sexual activity: Not on file  Other Topics Concern  . Not on file  Social History Narrative   Lives alone    Physical Exam Vitals signs reviewed.  HENT:     Head: Normocephalic.     Nose: Nose normal.     Mouth/Throat:     Mouth: Mucous membranes are moist.  Eyes:     Pupils: Pupils are equal, round, and reactive to light.  Neck:     Musculoskeletal:  Normal range of motion.  Cardiovascular:     Rate and Rhythm: Normal rate.     Pulses: Normal pulses.  Pulmonary:     Effort: Pulmonary effort is normal.     Breath sounds: Normal breath sounds.  Abdominal:     General: Abdomen is flat.     Palpations: Abdomen is soft.  Musculoskeletal:     Right lower leg: Edema present.     Left lower leg: Edema present.  Skin:    General: Skin is warm and dry.     Capillary Refill: Capillary refill takes less than 2 seconds.  Neurological:     Mental Status: She is alert. Mental status is at baseline.  Psychiatric:        Mood and Affect: Mood normal.     Arrived for home visit for [facility], who was seated in her wheel chair wearing her oxygen. Colletta Maryland reports feeling good today but having persistent swelling. Colletta Maryland noted to have +3 edema in both legs below the knee's. Vitals obtained and are as recorded. I spoke with Jasmine in the clinic who stated Dr. Aundra Dubin is having the patient take a dose of Metolzaone today and come in for a clinic visit on Monday. Colletta Maryland agreed and took metolazone. Medications were confirmed and pill box was filled. Patient's sugar noted to be elevated today. Colletta Maryland reports eating breakfast around 0900 and her sugar being 170 at 1200 upon assessment. I reached out to Dr. Zenia Resides nurse for follow up and left voicemail I am awaiting a follow up call  from same. Will update patient and granddaughter. Home visit complete.   Weight- 128lbs CBG- 170       Future Appointments  Date Time Provider [place]  04/30/2019  1:40 PM Larey Dresser, MD [facility] None  07/04/2019 11:15 AM Gardiner Barefoot, DPM [facility]     ACTION: Home visit completed Next visit planned for one week

## 2019-04-23 NOTE — Telephone Encounter (Signed)
Heather called to report swelling in patients legs up to her knees. Pt has compression stockings but still swollen. On 8/13 pt took prn metolazone because of weight being 136lbs weight dropped to 121lbs but no decrease in swelling.  Pt has no other symptoms and taking all medications as prescribed. Per Amy take metolazone 2.5mg  weekly and work in with Dr.McLean in 7-10 days with bmet. Per Dr.McLean  ok to work in on 8/31. Pt and Heather aware and agreeable with plan.

## 2019-04-24 ENCOUNTER — Telehealth (HOSPITAL_COMMUNITY): Payer: Self-pay

## 2019-04-24 NOTE — Telephone Encounter (Signed)
Received call from granddaughter who advised Kim Mullins is having increased leg swelling. Left leg double in size compared to right leg. Patient took metolazone yesterday with no improvements. Called HF clinic awaiting call back.

## 2019-04-29 DIAGNOSIS — R0600 Dyspnea, unspecified: Secondary | ICD-10-CM | POA: Diagnosis not present

## 2019-04-29 DIAGNOSIS — J45909 Unspecified asthma, uncomplicated: Secondary | ICD-10-CM | POA: Diagnosis not present

## 2019-04-29 DIAGNOSIS — J9601 Acute respiratory failure with hypoxia: Secondary | ICD-10-CM | POA: Diagnosis not present

## 2019-04-29 DIAGNOSIS — G4733 Obstructive sleep apnea (adult) (pediatric): Secondary | ICD-10-CM | POA: Diagnosis not present

## 2019-04-29 DIAGNOSIS — I509 Heart failure, unspecified: Secondary | ICD-10-CM | POA: Diagnosis not present

## 2019-04-30 ENCOUNTER — Other Ambulatory Visit (HOSPITAL_COMMUNITY): Payer: Self-pay | Admitting: Cardiology

## 2019-04-30 ENCOUNTER — Ambulatory Visit (HOSPITAL_COMMUNITY)
Admission: RE | Admit: 2019-04-30 | Discharge: 2019-04-30 | Disposition: A | Discharge status: Home / Self Care | Payer: Medicare HMO | Source: Ambulatory Visit | Attending: Cardiology | Admitting: Cardiology

## 2019-04-30 ENCOUNTER — Other Ambulatory Visit: Payer: Self-pay

## 2019-04-30 ENCOUNTER — Other Ambulatory Visit (HOSPITAL_COMMUNITY): Payer: Self-pay

## 2019-04-30 ENCOUNTER — Encounter (HOSPITAL_COMMUNITY): Payer: Self-pay | Admitting: Cardiology

## 2019-04-30 VITALS — BP 99/74 | HR 69 | Wt 121.8 lb

## 2019-04-30 DIAGNOSIS — I428 Other cardiomyopathies: Secondary | ICD-10-CM | POA: Insufficient documentation

## 2019-04-30 DIAGNOSIS — K219 Gastro-esophageal reflux disease without esophagitis: Secondary | ICD-10-CM | POA: Insufficient documentation

## 2019-04-30 DIAGNOSIS — I5042 Chronic combined systolic (congestive) and diastolic (congestive) heart failure: Secondary | ICD-10-CM

## 2019-04-30 DIAGNOSIS — Z87891 Personal history of nicotine dependence: Secondary | ICD-10-CM | POA: Insufficient documentation

## 2019-04-30 DIAGNOSIS — N184 Chronic kidney disease, stage 4 (severe): Secondary | ICD-10-CM | POA: Diagnosis not present

## 2019-04-30 DIAGNOSIS — I43 Cardiomyopathy in diseases classified elsewhere: Secondary | ICD-10-CM | POA: Diagnosis not present

## 2019-04-30 DIAGNOSIS — Z885 Allergy status to narcotic agent status: Secondary | ICD-10-CM | POA: Diagnosis not present

## 2019-04-30 DIAGNOSIS — I5022 Chronic systolic (congestive) heart failure: Secondary | ICD-10-CM | POA: Diagnosis not present

## 2019-04-30 DIAGNOSIS — J9611 Chronic respiratory failure with hypoxia: Secondary | ICD-10-CM | POA: Diagnosis not present

## 2019-04-30 DIAGNOSIS — E854 Organ-limited amyloidosis: Secondary | ICD-10-CM | POA: Insufficient documentation

## 2019-04-30 DIAGNOSIS — Z880 Allergy status to penicillin: Secondary | ICD-10-CM | POA: Diagnosis not present

## 2019-04-30 DIAGNOSIS — Z7901 Long term (current) use of anticoagulants: Secondary | ICD-10-CM | POA: Insufficient documentation

## 2019-04-30 DIAGNOSIS — Z79899 Other long term (current) drug therapy: Secondary | ICD-10-CM | POA: Diagnosis not present

## 2019-04-30 DIAGNOSIS — G4733 Obstructive sleep apnea (adult) (pediatric): Secondary | ICD-10-CM | POA: Diagnosis not present

## 2019-04-30 DIAGNOSIS — E1122 Type 2 diabetes mellitus with diabetic chronic kidney disease: Secondary | ICD-10-CM | POA: Insufficient documentation

## 2019-04-30 DIAGNOSIS — I480 Paroxysmal atrial fibrillation: Secondary | ICD-10-CM | POA: Diagnosis not present

## 2019-04-30 DIAGNOSIS — Z9981 Dependence on supplemental oxygen: Secondary | ICD-10-CM | POA: Insufficient documentation

## 2019-04-30 DIAGNOSIS — J45909 Unspecified asthma, uncomplicated: Secondary | ICD-10-CM | POA: Insufficient documentation

## 2019-04-30 DIAGNOSIS — Z8673 Personal history of transient ischemic attack (TIA), and cerebral infarction without residual deficits: Secondary | ICD-10-CM | POA: Diagnosis not present

## 2019-04-30 LAB — BASIC METABOLIC PANEL
Anion gap: 14 (ref 5–15)
BUN: 41 mg/dL — ABNORMAL HIGH (ref 8–23)
CO2: 23 mmol/L (ref 22–32)
Calcium: 8.9 mg/dL (ref 8.9–10.3)
Chloride: 95 mmol/L — ABNORMAL LOW (ref 98–111)
Creatinine, Ser: 1.26 mg/dL — ABNORMAL HIGH (ref 0.44–1.00)
GFR calc Af Amer: 45 mL/min — ABNORMAL LOW (ref >60)
GFR calc non Af Amer: 39 mL/min — ABNORMAL LOW (ref >60)
Glucose, Bld: 102 mg/dL — ABNORMAL HIGH (ref 70–99)
Potassium: 4.1 mmol/L (ref 3.5–5.1)
Sodium: 132 mmol/L — ABNORMAL LOW (ref 135–145)

## 2019-04-30 MED ORDER — POTASSIUM CHLORIDE POWD: 40.0000 meq | Freq: Two times a day (BID) | 0 refills | Status: DC

## 2019-04-30 MED ORDER — METOLAZONE 2.5 MG PO TABS: ORAL_TABLET | ORAL | 1 refills | Status: DC

## 2019-04-30 NOTE — Progress Notes (Signed)
ID:  Kim Mullins, DOB [birth year], MRN [id number]  Location: Home  Provider location: [facility] Advanced Heart Failure Type of Visit: Established patient   PCP:  Lin Landsman, MD  Cardiologist:   Dr Aundra Dubin   History of Present Illness: Kim Mullins is a 85 y.o. female with a history of chronic systolic CHF/nonischemic cardiomyopathy, prior complete heart block, OSA, and paroxysmal atrial fibrillation who presents for followup of CHF. She was diagnosed with a cardiomyopathy back in 2015. She had right and left heart cath in 12/15, showing no significant coronary disease. EF was in the 30-35% range. She was admitted in 2/16 with symptomatic bradycardia/complete heart block and had Medtronic CRT-P placed. In 4/16, EF was back up to 50-55%. However, repeat echo in 6/17 showed EF down to 15-20% with RV dysfunction.   She was admitted in 8/17 with suspected TIA =>dysarthria and blurred vision. She had been on Eliquis 2.5 mg bid, this was increased to 5 mg bid. Lisinopril was stopped and Coreg was decreased due to soft BP. Echo was done that admission, showing improvement in EF to 40-45%.  Echo in 5/19 showed EF 40%, moderate LVH, moderate diastolic dysfunction, mild to moderately decreased RV systolic function, dilated IVC.   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   Admitted 11/19 - 07/26/18 with worsening renal failure with Cr up to 3.7 from baseline 1.5 - 1.7. RHC demonstrated R>L failure as below. Transiently on milrinone, but weaned off prior to discharge. Cr remained elevated but stable at 2.7 on day of discharge. Echo in 11/19 showed EF 35-40%, severe TR, mildly dilated RV.   Patient reports increased peripheral edema recently.  She has taken metolazone twice in the last month.  Weight has actually been trending down.  Breathing has been ok, she denies dyspnea walking around the house.  No orthopnea/PND.  Lives with her grand-daughter.   SBP generally in 90s, no lightheadedness.     ECG (personally reviewed): NSR, BiV pacing  Labs (2/20): K 3.7, creatinine 1.72, hgb 14, BNP 1425 Labs (6/20): K 4.3, creatinine 1.5  PMH: 1. Type II diabetes: Diet-controlled.  2. GERD: s/p esophageal dilatation.  3. Chronic systolic CHF: 1st noted in 2015. Nonischemic cardiomyopathy.  - Echo (11/15) with EF 40-45%.  - LHC/RHC (12/15): Normal coronaries, EF 30-35%, mean RA 9, PA 44/16 mean 31, unable to obtain PCWP, CI 2.5.  - Developed CHB and had Medtronic CRT-P placed in 2/16. - Echo (4/16) with EF 50-55%.  - Echo (6/17): EF 15-20%, moderate LVh, restrictive diastolic function, mild MR, RV moderately dilated and moderately decreased in function, moderate TR, PASP 46 mmHg.  - Echo (8/17): EF 40-45%, diffuse hypokinesis, mild MR.  - Echo (5/19): EF 40%, moderate LVH, moderate diastolic dysfunction, mild to moderately decreased RV systolic function, dilated IVC. - RHC (11/19): mean RA 16, PA 41/19, mean PCWP 22, CI 2.74, PVR 1.4 WU.  - Echo (11/19): EF 35-40%, moderate LVH, mild MR, severe TR, mildly dilated RV.  4. Complete heart block: Medtronic CRT-P in 2/16.  5. OSA: Uses CPAP 6. Asthma 7. Atrial fibrillation: Paroxysmal.  8. H/o CVA.  Possible TIA in 8/17.    - Carotid dopplers (8/17) with 1-39% BICA stenosis.  9. 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  10. CKD stage IV 11. Peripheral arterial dopplers (7/20): ABIs noncompressible, TBIs normal.   SH: Widow, prior smoker quit 1979, has a Geographical information systems officer  in [place] (closest relative), used to work for [employer].   FH: No cardiac problems that she knows of.   Current Outpatient Medications  Medication Sig Dispense Refill  . Acetaminophen (TYLENOL) 325 MG CAPS Take 650 mg by mouth daily as needed (for shoulder pain).     Marland Kitchen apixaban (ELIQUIS) 2.5 MG TABS tablet Take 1 tablet (2.5 mg total) by mouth 2 (two) times daily. 60 tablet 11  .  bumetanide (BUMEX) 2 MG tablet Take 2 tablets (4 mg total) by mouth 2 (two) times daily. Take additional dose on Sunday if weight 149lbs or greater. 60 tablet 6  . diclofenac sodium (VOLTAREN) 1 % GEL Apply 2 g topically 3 (three) times daily as needed (pain, inflammation). 100 g 0  . fexofenadine (ALLEGRA) 180 MG tablet Take 180 mg by mouth daily.    Marland Kitchen glucose blood test strip Use to test blood sugar twice daily (Accuchek Smartview) 100 each 0  . latanoprost (XALATAN) 0.005 % ophthalmic solution Place 1 drop into both eyes at bedtime.   11  . metolazone (ZAROXOLYN) 2.5 MG tablet Take one tablet by mouth every Tuesday 12 tablet 1  . OXYGEN Inhale 2 L into the lungs.    . timolol (TIMOPTIC) 0.5 % ophthalmic solution Place 1 drop into both eyes daily.   11  . KLOR-CON 20 MEQ packet TAKE 2 PACKETS AS DIRECTED TWICE DAILY AND 1 ADDITIONAL PACKET ON THE SAME DAY AS YOU TAKE METOLAZONE 270 each 3   No current facility-administered medications for this encounter.     Allergies:   Hydrocodone, Percocet [oxycodone-acetaminophen], Eggs or egg-derived products, Mercurial derivatives, Penicillins, and Quinine derivatives   ROS:  Please see the history of present illness.   All other systems are personally reviewed and negative.   Exam:   BP 99/74   Pulse 69   Wt 55.2 kg (121 lb 12.8 oz)   SpO2 96%   BMI 21.58 kg/m  General: NAD Neck: JVP 12 cm, no thyromegaly or thyroid nodule.  Lungs: Clear to auscultation bilaterally with normal respiratory effort. CV: Nondisplaced PMI.  Heart regular S1/S2, no S3/S4, 2/6 HSM LLSB. 2+ left ankle edema, 1+ right ankle edema.  No carotid bruit.  Normal pedal pulses.  Abdomen: Soft, nontender, no hepatosplenomegaly, no distention.  Skin: Intact without lesions or rashes.  Neurologic: Alert and oriented x 3.  Psych: Normal affect. Extremities: No clubbing or cyanosis.  HEENT: Normal.   Recent Labs: 08/20/2018: ALT 13 10/09/2018: B Natriuretic Peptide 1,424.7  02/08/2019: Hemoglobin 12.7; Platelets 140 04/30/2019: BUN 41; Creatinine, Ser 1.26; Potassium 4.1; Sodium 132  Personally reviewed   Wt Readings from Last 3 Encounters:  04/30/19 55.2 kg (121 lb 12.8 oz)  04/23/19 58.1 kg (128 lb)  04/16/19 57.6 kg (127 lb)    Vitals:   04/30/19 1358  BP: 99/74  Pulse: 69  SpO2: 96%    ASSESSMENT AND PLAN:  1. Chronic systolic CHF: Nonischemic cardiomyopathy.  MDT CRT-P device.  ECHO 07/21/2018 with EF 35-40%, severe TR.  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  Patient has had progressive worsening of dyspnea and volume overload. This is complicated by CKD stage IV. She took metolazone twice this month so far. Less short of breath today than at last appointment (NYHA class III) but still volume overloaded on exam.   - Continue bumetanide 4 mg bid.  She will take metolazone this week on Tuesday and Friday with am Bumex, then once a  week every Tuesday after that. She will take an extra 20 mEq KCl on metolazone days. BMET today and again in 10 days.  - Wear compression stockings daily.  -No ACEI/ARB/spironolactone/ARNI/beta blocker with CKD stage IV and marginal BP.  2. 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  I think that with advanced age and advanced HF, she is unlikely to benefit significantly from tafamidis. 3. Atrial fibrillation: Paroxysmal.  She is in NSR today.  -Eliquis2.5 g BID given her age and elevated creatinine. No bleeding problems.  4. CKD Stage IV: Creatinine baseline 1.5 - 2.  Check BMET today.  5. CHB: Medtronic CRT-P.   6. Chronic Hypoxic Respiratory Failure: Continue  2 liter oxygen continuously.   Poor prognosis with advanced HF in setting of nonischemic cardiomyopathy/cardiac amyloidosis.   Signed, [name], MD  04/30/2019  [facility] [address] [facility] [place] [address] [phone] (office) [phone] (fax)

## 2019-04-30 NOTE — Patient Instructions (Signed)
Labs done today. We will contact you only if your labs are abnormal.  START Metolazone 2.5mg (1 tablet) EVERY Tuesday. TAKE A DOSE TOMORROW MORNING(TUESDAY) THEN ON Friday morning of this week.   START potassium(powder form) 65meq by mouth two times daily. TAKE AN ADDITIONAL 30meq ON THE DAYS YOU TAKE THE METOLAZONE.   Your provider ask that you wear your compression stockings to help with swelling.  Your physician recommends that you schedule a follow-up appointment in: 10 days for a lab visit and in 1 month to see Amy Clegg,NP  At the [facility], you and your health needs are our priority. As part of our continuing mission to provide you with exceptional heart care, we have created designated Provider Care Teams. These Care Teams include your primary Cardiologist (physician) and Advanced Practice Providers (APPs- Physician Assistants and Nurse Practitioners) who all work together to provide you with the care you need, when you need it.   You may see any of the following providers on your designated Care Team at your next follow up: Marland Kitchen Dr Glori Bickers . Dr [name] . Darrick Grinder, NP   Please be sure to bring in all your medications bottles to every appointment.

## 2019-04-30 NOTE — Progress Notes (Signed)
Clinic visit complete today with Dr. Aundra Dubin. Dr. Aundra Dubin increased Metolazone to one pill Tuesday AM and Friday AM for this week and every Tuesday AM following this week. Dr. Aundra Dubin also increased one extra potassium on the days taking Metolazone. Patient and granddaughter understood. Medications were verified and pill box was filled appropriately. Medications noted for refill. Bumex and Potassium. Kim Mullins had no complaints of pain or shortness of breath. Edema noted with full neck veins. Lung sounds clear. Vitals as noted by CMA. Kim Mullins agreed to home visit next Monday. Visit completed.     Vitals- WT-121.4lbs  BP 99/74 O2- 96% HR- 68

## 2019-05-02 ENCOUNTER — Telehealth (HOSPITAL_COMMUNITY): Payer: Self-pay | Admitting: Licensed Clinical Social Worker

## 2019-05-02 NOTE — Telephone Encounter (Signed)
CSW reached out to pt to check in regarding food and medication status at this time. Pt reports no concerns at this time and has been feeling pretty good about her health.  Pt reports her granddaughter is still staying with her and that this has been a big help and a comfort.  CSW encouraged pt to reach out with any concerns and will continue to follow and assist as needed  Jorge Ny, [place] Worker [facility] [phone]

## 2019-05-07 ENCOUNTER — Other Ambulatory Visit (HOSPITAL_COMMUNITY): Payer: Self-pay

## 2019-05-07 NOTE — Telephone Encounter (Signed)
Created accidentally.

## 2019-05-07 NOTE — Progress Notes (Signed)
Paramedicine Encounter    Patient ID: Kim Mullins, female    DOB: July 23, 1930, 85 y.o.   MRN: [id number]   Patient Care Team: Lin Landsman, MD as PCP - General (Family Medicine) Larey Dresser, MD as PCP - Advanced Heart Failure (Cardiology) Jorge Ny, LCSW as Social Worker (Licensed Clinical Social Worker)  Patient Active Problem List   Diagnosis Date Noted  . Obesity (BMI 30-39.9) 10/20/2018  . Acute on chronic heart failure ([place]) 08/19/2018  . Palliative care encounter   . Palliative care by specialist   . Acute respiratory failure with hypoxia ([place]) 08/18/2018  . Diabetes ([place]) 08/18/2018  . CKD (chronic kidney disease) 08/18/2018  . Amyloidosis ([place]) 08/18/2018  . Acute on chronic combined systolic and diastolic heart failure ([place]) 08/17/2018  . GERD (gastroesophageal reflux disease) 08/17/2018  . Elevated troponin 08/17/2018  . Glaucoma 08/17/2018  . Hyperbilirubinemia 08/17/2018  . CHF (congestive heart failure) ([place]) 07/18/2018  . Gait abnormality 05/03/2017  . Hypotension 04/14/2016  . Hypotension due to drugs   . Chronic anticoagulation   . TIA (transient ischemic attack) 04/12/2016  . UTI (lower urinary tract infection) 04/12/2016  . AKI (acute kidney injury) ([place]) 04/12/2016  . Atrial fibrillation ([place]) 09/12/2015  . Occipital infarction ([place])   . History of stroke 06/18/2015  . Homonymous hemianopsia   . Pacemaker 01/21/2015  . OSA (obstructive sleep apnea) 10/23/2014  . Dizziness 10/21/2014  . Complete heart block ([place]) 10/06/2014  . Benign essential HTN 10/04/2014  . Acute on chronic systolic CHF (congestive heart failure) ([place]) 10/04/2014  . Abnormal cardiovascular stress test 08/27/2014  . Pulmonary HTN ([place]) 08/16/2014  . DCM (dilated cardiomyopathy) ([place]) 08/16/2014  . Cough 07/13/2014  . Esophageal stricture 06/11/2014  . Nonspecific (abnormal) findings on radiological and other examination of gastrointestinal tract 05/29/2014  .  Dyspnea 04/29/2014  . Osteoarthritis of right shoulder region 06/26/2013  . DJD (degenerative joint disease) of hip 11/16/2011    Class: Present on Admission    Current Outpatient Medications:  .  apixaban (ELIQUIS) 2.5 MG TABS tablet, Take 1 tablet (2.5 mg total) by mouth 2 (two) times daily., Disp: 60 tablet, Rfl: 11 .  bumetanide (BUMEX) 2 MG tablet, Take 2 tablets (4 mg total) by mouth 2 (two) times daily. Take additional dose on Sunday if weight 149lbs or greater., Disp: 60 tablet, Rfl: 6 .  fexofenadine (ALLEGRA) 180 MG tablet, Take 180 mg by mouth daily., Disp: , Rfl:  .  glucose blood test strip, Use to test blood sugar twice daily ([employer] Smartview), Disp: 100 each, Rfl: 0 .  KLOR-CON 20 MEQ packet, TAKE 2 PACKETS AS DIRECTED TWICE DAILY AND 1 ADDITIONAL PACKET ON THE SAME DAY AS YOU TAKE METOLAZONE, Disp: 270 each, Rfl: 3 .  latanoprost (XALATAN) 0.005 % ophthalmic solution, Place 1 drop into both eyes at bedtime. , Disp: , Rfl: 11 .  metolazone (ZAROXOLYN) 2.5 MG tablet, Take one tablet by mouth every Tuesday, Disp: 12 tablet, Rfl: 1 .  OXYGEN, Inhale 2 L into the lungs., Disp: , Rfl:  .  timolol (TIMOPTIC) 0.5 % ophthalmic solution, Place 1 drop into both eyes daily. , Disp: , Rfl: 11 .  Acetaminophen (TYLENOL) 325 MG CAPS, Take 650 mg by mouth daily as needed (for shoulder pain). , Disp: , Rfl:  .  diclofenac sodium (VOLTAREN) 1 % GEL, Apply 2 g topically 3 (three) times daily as needed (pain, inflammation). (Patient not taking: Reported on 05/07/2019), Disp: 100 g,  Rfl: 0 Allergies  Allergen Reactions  . Hydrocodone Other (See Comments)    Took away all energy and made her stop eating food for short time  . Percocet [Oxycodone-Acetaminophen] Other (See Comments)    Too away all energy and made her stop eating food for short time  . Eggs Or Egg-Derived Products Hives and Rash  . Mercurial Derivatives Itching and Rash  . Penicillins Hives and Rash    Has patient had a PCN  reaction causing immediate rash, facial/tongue/throat swelling, SOB or lightheadedness with hypotension: Yes Has patient had a PCN reaction causing severe rash involving mucus membranes or skin necrosis: No Has patient had a PCN reaction that required hospitalization: No Has patient had a PCN reaction occurring within the last 10 years: Yes If all of the above answers are "NO", then may proceed with Cephalosporin use.  . Quinine Derivatives Hives and Rash     Social History   Socioeconomic History  . Marital status: Widowed    Spouse name: Not on file  . Number of children: 0  . Years of education: Not on file  . Highest education level: Doctorate  Occupational History  . Occupation: retired  Scientific laboratory technician  . Financial resource strain: Not hard at all  . Food insecurity    Worry: Never true    Inability: Never true  . Transportation needs    Medical: No    Non-medical: No  Tobacco Use  . Smoking status: Former Smoker    Packs/day: 1.00    Years: 45.00    Pack years: 45.00    Types: Cigarettes    Quit date: 07/24/1978    Years since quitting: 40.8  . Smokeless tobacco: Never Used  Substance and Sexual Activity  . Alcohol use: Yes    Alcohol/week: 7.0 standard drinks    Types: 7 Glasses of wine per week    Comment: every day  . Drug use: No  . Sexual activity: Not Currently  Lifestyle  . Physical activity    Days per week: Not on file    Minutes per session: Not on file  . Stress: Not on file  Relationships  . Social Herbalist on phone: Not on file    Gets together: Not on file    Attends religious service: Not on file    Active member of club or organization: Not on file    Attends meetings of clubs or organizations: Not on file    Relationship status: Not on file  . Intimate partner violence    Fear of current or ex partner: Not on file    Emotionally abused: Not on file    Physically abused: Not on file    Forced sexual activity: Not on file   Other Topics Concern  . Not on file  Social History Narrative   Lives alone    Physical Exam Vitals signs reviewed.  HENT:     Head: Normocephalic.     Nose: Nose normal. No congestion or rhinorrhea.     Mouth/Throat:     Mouth: Mucous membranes are moist.  Eyes:     Pupils: Pupils are equal, round, and reactive to light.  Cardiovascular:     Rate and Rhythm: Normal rate and regular rhythm.     Pulses: Normal pulses.     Heart sounds: Normal heart sounds.  Pulmonary:     Effort: Pulmonary effort is normal.     Breath sounds: Normal breath sounds.  Abdominal:     General: Abdomen is flat.     Palpations: Abdomen is soft.  Musculoskeletal: Normal range of motion.     Right lower leg: No edema.     Left lower leg: No edema.  Skin:    General: Skin is warm and dry.     Capillary Refill: Capillary refill takes less than 2 seconds.  Neurological:     Mental Status: She is alert. Mental status is at baseline.  Psychiatric:        Mood and Affect: Mood normal.     Arrived for home visit for [facility]. Kim Mullins was alert and seated on her walker with granddaughter at her side. Kim Mullins reports feeling "good" today. Patient noted to not be wearing her oxygen but reports no trouble breathing with no obvious signs of respiratory distress. Lung sounds clear. Vitals obtained and are as noted in report. Patient's daughter drew a concern for [place] loosing weight and has a lack of appetite. Kim Mullins's sugar noted to be 197 fasting. PCP has been notified multiple times and we have had no return of call. Kim Mullins's granddaughter suggested a new PCP. I will be researching same for patient. Patient also expressed that she did not like the potassium powder and would like to go back to the pills for same. Patient's granddaughter stated she will be contacting the pharmacy for same. Patient reminded about lab follow up later this week. Patient agreed to same and follow up next week with me.    Weight- 116lbs   CBG-[id number]    Meds for order- [facility]     Future Appointments  Date Time Provider [place]  05/10/2019  1:45 PM [facility] LAB [facility] None  05/29/2019 11:30 AM [facility] PA/NP [facility] None  07/04/2019 11:15 AM Gardiner Barefoot, DPM [facility]     ACTION: Home visit completed Next visit planned for one week

## 2019-05-10 ENCOUNTER — Other Ambulatory Visit: Payer: Self-pay

## 2019-05-10 ENCOUNTER — Ambulatory Visit (HOSPITAL_COMMUNITY)
Admission: RE | Admit: 2019-05-10 | Discharge: 2019-05-10 | Disposition: A | Discharge status: Home / Self Care | Payer: Medicare HMO | Source: Ambulatory Visit | Attending: Internal Medicine | Admitting: Internal Medicine

## 2019-05-10 ENCOUNTER — Telehealth (HOSPITAL_COMMUNITY): Payer: Self-pay

## 2019-05-10 DIAGNOSIS — I5042 Chronic combined systolic (congestive) and diastolic (congestive) heart failure: Secondary | ICD-10-CM | POA: Insufficient documentation

## 2019-05-10 LAB — BASIC METABOLIC PANEL
Anion gap: 14 (ref 5–15)
BUN: 71 mg/dL — ABNORMAL HIGH (ref 8–23)
CO2: 29 mmol/L (ref 22–32)
Calcium: 9.3 mg/dL (ref 8.9–10.3)
Chloride: 88 mmol/L — ABNORMAL LOW (ref 98–111)
Creatinine, Ser: 1.3 mg/dL — ABNORMAL HIGH (ref 0.44–1.00)
GFR calc Af Amer: 43 mL/min — ABNORMAL LOW (ref >60)
GFR calc non Af Amer: 37 mL/min — ABNORMAL LOW (ref >60)
Glucose, Bld: 149 mg/dL — ABNORMAL HIGH (ref 70–99)
Potassium: 3.1 mmol/L — ABNORMAL LOW (ref 3.5–5.1)
Sodium: 131 mmol/L — ABNORMAL LOW (ref 135–145)

## 2019-05-10 MED ORDER — BUMETANIDE 1 MG PO TABS: 1.0000 mg | ORAL_TABLET | Freq: Two times a day (BID) | ORAL | 6 refills | Status: DC

## 2019-05-10 MED ORDER — BUMETANIDE 2 MG PO TABS: 2.0000 mg | ORAL_TABLET | Freq: Two times a day (BID) | ORAL | 6 refills | Status: DC

## 2019-05-10 NOTE — Telephone Encounter (Signed)
-----   Message from Larey Dresser, MD sent at 05/10/2019  2:28 PM EDT ----- Take an extra 20 mEq KCl today.  Hold bumetanide for a day, then decrease to 3 mg bid.

## 2019-05-10 NOTE — Telephone Encounter (Signed)
Pt and care taker aware of lab results and medication recommendations/changes.   They asked for separate prescription for bumex 1 mg tablets.  Advised of new dose after holding for 1 day. Will take extram potassium 1 tab today. Verbalized understanding.

## 2019-05-14 ENCOUNTER — Inpatient Hospital Stay (HOSPITAL_COMMUNITY)
Admission: EM | Admit: 2019-05-14 | Discharge: 2019-05-25 | DRG: 378 | Disposition: A | Discharge status: Skilled Nursing Facility | Payer: Medicare HMO | Attending: Internal Medicine | Admitting: Internal Medicine

## 2019-05-14 ENCOUNTER — Other Ambulatory Visit (HOSPITAL_COMMUNITY): Payer: Self-pay

## 2019-05-14 ENCOUNTER — Emergency Department (HOSPITAL_COMMUNITY): Payer: Medicare HMO

## 2019-05-14 DIAGNOSIS — W19XXXA Unspecified fall, initial encounter: Secondary | ICD-10-CM | POA: Diagnosis not present

## 2019-05-14 DIAGNOSIS — R627 Adult failure to thrive: Secondary | ICD-10-CM | POA: Diagnosis present

## 2019-05-14 DIAGNOSIS — K644 Residual hemorrhoidal skin tags: Secondary | ICD-10-CM | POA: Diagnosis present

## 2019-05-14 DIAGNOSIS — Z90710 Acquired absence of both cervix and uterus: Secondary | ICD-10-CM

## 2019-05-14 DIAGNOSIS — E860 Dehydration: Secondary | ICD-10-CM | POA: Diagnosis present

## 2019-05-14 DIAGNOSIS — R531 Weakness: Secondary | ICD-10-CM | POA: Diagnosis not present

## 2019-05-14 DIAGNOSIS — I442 Atrioventricular block, complete: Secondary | ICD-10-CM | POA: Diagnosis present

## 2019-05-14 DIAGNOSIS — G4733 Obstructive sleep apnea (adult) (pediatric): Secondary | ICD-10-CM | POA: Diagnosis present

## 2019-05-14 DIAGNOSIS — K219 Gastro-esophageal reflux disease without esophagitis: Secondary | ICD-10-CM

## 2019-05-14 DIAGNOSIS — Z20828 Contact with and (suspected) exposure to other viral communicable diseases: Secondary | ICD-10-CM | POA: Diagnosis present

## 2019-05-14 DIAGNOSIS — I5022 Chronic systolic (congestive) heart failure: Secondary | ICD-10-CM | POA: Diagnosis not present

## 2019-05-14 DIAGNOSIS — E854 Organ-limited amyloidosis: Secondary | ICD-10-CM | POA: Diagnosis present

## 2019-05-14 DIAGNOSIS — N183 Chronic kidney disease, stage 3 (moderate): Secondary | ICD-10-CM | POA: Diagnosis present

## 2019-05-14 DIAGNOSIS — K920 Hematemesis: Secondary | ICD-10-CM | POA: Diagnosis not present

## 2019-05-14 DIAGNOSIS — Z885 Allergy status to narcotic agent status: Secondary | ICD-10-CM

## 2019-05-14 DIAGNOSIS — H9190 Unspecified hearing loss, unspecified ear: Secondary | ICD-10-CM | POA: Diagnosis present

## 2019-05-14 DIAGNOSIS — R600 Localized edema: Secondary | ICD-10-CM | POA: Diagnosis not present

## 2019-05-14 DIAGNOSIS — Z8673 Personal history of transient ischemic attack (TIA), and cerebral infarction without residual deficits: Secondary | ICD-10-CM | POA: Diagnosis not present

## 2019-05-14 DIAGNOSIS — Z79899 Other long term (current) drug therapy: Secondary | ICD-10-CM

## 2019-05-14 DIAGNOSIS — I081 Rheumatic disorders of both mitral and tricuspid valves: Secondary | ICD-10-CM | POA: Diagnosis present

## 2019-05-14 DIAGNOSIS — I4891 Unspecified atrial fibrillation: Secondary | ICD-10-CM | POA: Diagnosis not present

## 2019-05-14 DIAGNOSIS — R0602 Shortness of breath: Secondary | ICD-10-CM | POA: Diagnosis not present

## 2019-05-14 DIAGNOSIS — K5900 Constipation, unspecified: Secondary | ICD-10-CM | POA: Diagnosis present

## 2019-05-14 DIAGNOSIS — Z96611 Presence of right artificial shoulder joint: Secondary | ICD-10-CM | POA: Diagnosis present

## 2019-05-14 DIAGNOSIS — N189 Chronic kidney disease, unspecified: Secondary | ICD-10-CM | POA: Diagnosis present

## 2019-05-14 DIAGNOSIS — K5790 Diverticulosis of intestine, part unspecified, without perforation or abscess without bleeding: Secondary | ICD-10-CM | POA: Diagnosis not present

## 2019-05-14 DIAGNOSIS — E875 Hyperkalemia: Secondary | ICD-10-CM | POA: Diagnosis not present

## 2019-05-14 DIAGNOSIS — E871 Hypo-osmolality and hyponatremia: Secondary | ICD-10-CM | POA: Diagnosis not present

## 2019-05-14 DIAGNOSIS — I420 Dilated cardiomyopathy: Secondary | ICD-10-CM | POA: Diagnosis present

## 2019-05-14 DIAGNOSIS — I10 Essential (primary) hypertension: Secondary | ICD-10-CM

## 2019-05-14 DIAGNOSIS — Z6823 Body mass index (BMI) 23.0-23.9, adult: Secondary | ICD-10-CM

## 2019-05-14 DIAGNOSIS — Z880 Allergy status to penicillin: Secondary | ICD-10-CM

## 2019-05-14 DIAGNOSIS — N179 Acute kidney failure, unspecified: Secondary | ICD-10-CM | POA: Diagnosis present

## 2019-05-14 DIAGNOSIS — Z8249 Family history of ischemic heart disease and other diseases of the circulatory system: Secondary | ICD-10-CM

## 2019-05-14 DIAGNOSIS — Z7901 Long term (current) use of anticoagulants: Secondary | ICD-10-CM

## 2019-05-14 DIAGNOSIS — I480 Paroxysmal atrial fibrillation: Secondary | ICD-10-CM | POA: Diagnosis present

## 2019-05-14 DIAGNOSIS — R51 Headache: Secondary | ICD-10-CM | POA: Diagnosis not present

## 2019-05-14 DIAGNOSIS — J9611 Chronic respiratory failure with hypoxia: Secondary | ICD-10-CM | POA: Diagnosis present

## 2019-05-14 DIAGNOSIS — I272 Pulmonary hypertension, unspecified: Secondary | ICD-10-CM | POA: Diagnosis present

## 2019-05-14 DIAGNOSIS — I130 Hypertensive heart and chronic kidney disease with heart failure and stage 1 through stage 4 chronic kidney disease, or unspecified chronic kidney disease: Secondary | ICD-10-CM | POA: Diagnosis present

## 2019-05-14 DIAGNOSIS — Z950 Presence of cardiac pacemaker: Secondary | ICD-10-CM

## 2019-05-14 DIAGNOSIS — I43 Cardiomyopathy in diseases classified elsewhere: Secondary | ICD-10-CM | POA: Diagnosis present

## 2019-05-14 DIAGNOSIS — Z9181 History of falling: Secondary | ICD-10-CM

## 2019-05-14 DIAGNOSIS — I428 Other cardiomyopathies: Secondary | ICD-10-CM | POA: Diagnosis present

## 2019-05-14 DIAGNOSIS — Z87891 Personal history of nicotine dependence: Secondary | ICD-10-CM

## 2019-05-14 DIAGNOSIS — Z818 Family history of other mental and behavioral disorders: Secondary | ICD-10-CM

## 2019-05-14 DIAGNOSIS — I5084 End stage heart failure: Secondary | ICD-10-CM | POA: Diagnosis present

## 2019-05-14 DIAGNOSIS — H409 Unspecified glaucoma: Secondary | ICD-10-CM | POA: Diagnosis present

## 2019-05-14 DIAGNOSIS — E11649 Type 2 diabetes mellitus with hypoglycemia without coma: Secondary | ICD-10-CM | POA: Diagnosis not present

## 2019-05-14 DIAGNOSIS — J45909 Unspecified asthma, uncomplicated: Secondary | ICD-10-CM | POA: Diagnosis present

## 2019-05-14 DIAGNOSIS — Z833 Family history of diabetes mellitus: Secondary | ICD-10-CM

## 2019-05-14 DIAGNOSIS — Z96641 Presence of right artificial hip joint: Secondary | ICD-10-CM | POA: Diagnosis present

## 2019-05-14 DIAGNOSIS — E876 Hypokalemia: Secondary | ICD-10-CM | POA: Diagnosis not present

## 2019-05-14 DIAGNOSIS — Z883 Allergy status to other anti-infective agents status: Secondary | ICD-10-CM

## 2019-05-14 DIAGNOSIS — Z9981 Dependence on supplemental oxygen: Secondary | ICD-10-CM

## 2019-05-14 DIAGNOSIS — E1122 Type 2 diabetes mellitus with diabetic chronic kidney disease: Secondary | ICD-10-CM | POA: Diagnosis present

## 2019-05-14 DIAGNOSIS — Z888 Allergy status to other drugs, medicaments and biological substances status: Secondary | ICD-10-CM

## 2019-05-14 LAB — CBC WITH DIFFERENTIAL/PLATELET
Abs Immature Granulocytes: 0.02 10*3/uL (ref 0.00–0.07)
Basophils Absolute: 0 10*3/uL (ref 0.0–0.1)
Basophils Relative: 1 %
Eosinophils Absolute: 0 10*3/uL (ref 0.0–0.5)
Eosinophils Relative: 1 %
HCT: 46.8 % — ABNORMAL HIGH (ref 36.0–46.0)
Hemoglobin: 15.5 g/dL — ABNORMAL HIGH (ref 12.0–15.0)
Immature Granulocytes: 1 %
Lymphocytes Relative: 21 %
Lymphs Abs: 0.7 10*3/uL (ref 0.7–4.0)
MCH: 30.3 pg (ref 26.0–34.0)
MCHC: 33.1 g/dL (ref 30.0–36.0)
MCV: 91.6 fL (ref 80.0–100.0)
Monocytes Absolute: 0.2 10*3/uL (ref 0.1–1.0)
Monocytes Relative: 7 %
Neutro Abs: 2.2 10*3/uL (ref 1.7–7.7)
Neutrophils Relative %: 69 %
Platelets: 162 10*3/uL (ref 150–400)
RBC: 5.11 MIL/uL (ref 3.87–5.11)
RDW: 17.8 % — ABNORMAL HIGH (ref 11.5–15.5)
WBC: 3.2 10*3/uL — ABNORMAL LOW (ref 4.0–10.5)
nRBC: 0 % (ref 0.0–0.2)

## 2019-05-14 LAB — URINALYSIS, ROUTINE W REFLEX MICROSCOPIC
Bilirubin Urine: NEGATIVE
Glucose, UA: NEGATIVE mg/dL
Hgb urine dipstick: NEGATIVE
Ketones, ur: NEGATIVE mg/dL
Nitrite: NEGATIVE
Protein, ur: NEGATIVE mg/dL
Specific Gravity, Urine: 1.01 (ref 1.005–1.030)
pH: 8 (ref 5.0–8.0)

## 2019-05-14 LAB — COMPREHENSIVE METABOLIC PANEL
ALT: 25 U/L (ref 0–44)
AST: 46 U/L — ABNORMAL HIGH (ref 15–41)
Albumin: 3.6 g/dL (ref 3.5–5.0)
Alkaline Phosphatase: 118 U/L (ref 38–126)
Anion gap: 12 (ref 5–15)
BUN: 60 mg/dL — ABNORMAL HIGH (ref 8–23)
CO2: 20 mmol/L — ABNORMAL LOW (ref 22–32)
Calcium: 9.3 mg/dL (ref 8.9–10.3)
Chloride: 98 mmol/L (ref 98–111)
Creatinine, Ser: 1.71 mg/dL — ABNORMAL HIGH (ref 0.44–1.00)
GFR calc Af Amer: 31 mL/min — ABNORMAL LOW (ref >60)
GFR calc non Af Amer: 27 mL/min — ABNORMAL LOW (ref >60)
Glucose, Bld: 112 mg/dL — ABNORMAL HIGH (ref 70–99)
Potassium: 5.7 mmol/L — ABNORMAL HIGH (ref 3.5–5.1)
Sodium: 130 mmol/L — ABNORMAL LOW (ref 135–145)
Total Bilirubin: 3.9 mg/dL — ABNORMAL HIGH (ref 0.3–1.2)
Total Protein: 8.2 g/dL — ABNORMAL HIGH (ref 6.5–8.1)

## 2019-05-14 LAB — POC OCCULT BLOOD, ED: Fecal Occult Bld: NEGATIVE

## 2019-05-14 LAB — MAGNESIUM: Magnesium: 2.8 mg/dL — ABNORMAL HIGH (ref 1.7–2.4)

## 2019-05-14 LAB — LIPASE, BLOOD: Lipase: 31 U/L (ref 11–51)

## 2019-05-14 MED ORDER — PANTOPRAZOLE SODIUM 40 MG IV SOLR
40.0000 mg | Freq: Once | INTRAVENOUS | Status: AC
  Administered 2019-05-14: 40 mg via INTRAVENOUS
  Filled 2019-05-14: qty 40

## 2019-05-14 MED ORDER — SODIUM CHLORIDE 0.9 % IV BOLUS
250.0000 mL | Freq: Once | INTRAVENOUS | Status: AC
  Administered 2019-05-15: 01:00:00 250 mL via INTRAVENOUS

## 2019-05-14 MED ORDER — SODIUM CHLORIDE 0.9 % IV SOLN
INTRAVENOUS | Status: DC
  Administered 2019-05-14: 23:00:00 via INTRAVENOUS

## 2019-05-14 NOTE — ED Provider Notes (Signed)
[facility] EMERGENCY DEPARTMENT Provider Note   CSN: [id number] Arrival date & time: 05/14/19  1557     History   Chief Complaint Chief Complaint  Patient presents with  . Weakness    HPI Kim Mullins is a 85 y.o. female with a past medical history of complete heart block s/p pacemaker, CHF (EF 40%), CKD, hypertension, glaucoma, CVA, atrial fibrillation on Eliquis who presents emergency department with 1 week of fatigue and several episodes of hematemesis.  Patient reports last week she started feeling more fatigued with decreased appetite.  Over the weekend patient had approximately 5 episodes of coffee-ground emesis.  Patient also reports constipation for several months with last bowel movement yesterday, patient denies any blood in her bowel movements.  Patient reports some nausea recently but is not currently having nausea.  Patient reports recent changes to her diuretic medication with improvement in lower leg swelling.  Patient reports feeling discomfort in her upper abdomen/a fluttering feeling there over the last several days however she is not currently feeling that abdominal discomfort.  Patient denies fever, cough, shortness of breath, or dysuria.  Patient reports she lives at home with her daughter.  Granddaughter is at bedside and helps provide history.  Patient reports she had a fall approximately week ago and denies any known injury from it.    The history is provided by the patient and a relative.    Past Medical History:  Diagnosis Date  . Anemia    occassionally  . Arthritis    all over.  . Asthma    Allergixc reaction to cats.  . Atrial fibrillation ([place])   . Bowel obstruction ([place])   . Chronic systolic CHF (congestive heart failure) (HCC)    EF 30-35% by cath, echo 2016 EF 50%  . Complete heart block (HCC)    a. s/p MDT CRTP pacemaker  . DCM (dilated cardiomyopathy) ([place]) 08/16/2014   normal coronary arteries on cath with EF 30-45%.   EF now 50% by echo 11/2014  . Diabetes mellitus 2006   Diet and exercise controlled.  Marland Kitchen Dyspnea   . GERD (gastroesophageal reflux disease)    occ  . Glaucoma 08/17/2018  . Hypertension   . Left tibial fracture 2007  . OSA (obstructive sleep apnea) 10/23/2014   Moderate with AHI 21/hr  . Osteoarthritis of right shoulder region 06/26/2013  . Stroke [facility])     Patient Active Problem List   Diagnosis Date Noted  . Hematemesis 05/14/2019  . Chronic systolic CHF (congestive heart failure) ([place]) 05/14/2019  . Acute renal failure superimposed on stage 3 chronic kidney disease ([place]) 05/14/2019  . Fall 05/14/2019  . Hyponatremia 05/14/2019  . Hyperkalemia 05/14/2019  . Obesity (BMI 30-39.9) 10/20/2018  . Acute on chronic heart failure ([place]) 08/19/2018  . Palliative care encounter   . Palliative care by specialist   . Acute respiratory failure with hypoxia ([place]) 08/18/2018  . Diabetes ([place]) 08/18/2018  . CKD (chronic kidney disease) 08/18/2018  . Amyloidosis ([place]) 08/18/2018  . Acute on chronic combined systolic and diastolic heart failure ([place]) 08/17/2018  . GERD (gastroesophageal reflux disease) 08/17/2018  . Elevated troponin 08/17/2018  . Glaucoma 08/17/2018  . Hyperbilirubinemia 08/17/2018  . CHF (congestive heart failure) ([place]) 07/18/2018  . Gait abnormality 05/03/2017  . Hypotension 04/14/2016  . Hypotension due to drugs   . Chronic anticoagulation   . TIA (transient ischemic attack) 04/12/2016  . UTI (lower urinary tract infection) 04/12/2016  . AKI (acute  kidney injury) ([place]) 04/12/2016  . Atrial fibrillation ([place]) 09/12/2015  . Occipital infarction ([place])   . History of stroke 06/18/2015  . Homonymous hemianopsia   . Pacemaker 01/21/2015  . OSA (obstructive sleep apnea) 10/23/2014  . Dizziness 10/21/2014  . Complete heart block ([place]) 10/06/2014  . Benign essential HTN 10/04/2014  . Acute on chronic systolic CHF (congestive heart failure) ([place]) 10/04/2014  . Abnormal  cardiovascular stress test 08/27/2014  . Pulmonary HTN ([place]) 08/16/2014  . DCM (dilated cardiomyopathy) ([place]) 08/16/2014  . Cough 07/13/2014  . Esophageal stricture 06/11/2014  . Nonspecific (abnormal) findings on radiological and other examination of gastrointestinal tract 05/29/2014  . Dyspnea 04/29/2014  . Osteoarthritis of right shoulder region 06/26/2013  . DJD (degenerative joint disease) of hip 11/16/2011    Class: Present on Admission    Past Surgical History:  Procedure Laterality Date  . ABDOMINAL HYSTERECTOMY  1969  . BI-VENTRICULAR PACEMAKER INSERTION N/A 10/07/2014   MDT CRTP implanted by Dr Lovena Le  . BRAVO [place] STUDY N/A 06/11/2014   Procedure: BRAVO [place] STUDY;  Surgeon: Inda Castle, MD;  Location: [facility] ENDOSCOPY;  Service: Endoscopy;  Laterality: N/A;  . BUNIONECTOMY  1984   Bilateral  . CARDIAC CATHETERIZATION     normal coronary arteries  . Carpal [place]  2004   Bilateral  . CATARACT EXTRACTION Right    early 2015  . corn removal  1999   Bilateral feet  . DILATION AND CURETTAGE OF UTERUS  1968  . ESOPHAGOGASTRODUODENOSCOPY (EGD) WITH PROPOFOL N/A 06/11/2014   Procedure: ESOPHAGOGASTRODUODENOSCOPY (EGD) WITH PROPOFOL;  Surgeon: Inda Castle, MD;  Location: [facility] ENDOSCOPY;  Service: Endoscopy;  Laterality: N/A;  . [place]   Surgery to fix Retainal detachment, bilateral  . JOINT REPLACEMENT    . LEFT AND RIGHT HEART CATHETERIZATION WITH CORONARY ANGIOGRAM N/A 08/29/2014   Procedure: LEFT AND RIGHT HEART CATHETERIZATION WITH CORONARY ANGIOGRAM;  Surgeon: Peter M Martinique, MD;  Location: [facility] CATH LAB;  Service: Cardiovascular;  Laterality: N/A;  . MALONEY DILATION  06/11/2014   Procedure: Venia Minks DILATION;  Surgeon: Inda Castle, MD;  Location: [facility] ENDOSCOPY;  Service: Endoscopy;;  . PILONIDAL CYST EXCISION  1959  . RIGHT HEART CATH N/A 07/24/2018   Procedure: RIGHT HEART CATH;  Surgeon: Larey Dresser, MD;  Location: [facility] CV LAB;  Service:  Cardiovascular;  Laterality: N/A;  . TONSILLECTOMY  1942  . TOTAL HIP ARTHROPLASTY  11/16/2011   Procedure: TOTAL HIP ARTHROPLASTY ANTERIOR APPROACH;  Surgeon: Hessie Dibble, MD;  Location: [facility];  Service: Orthopedics;  Laterality: Right;  DEPUY  . TOTAL SHOULDER ARTHROPLASTY Right 06/26/2013   Procedure: TOTAL SHOULDER ARTHROPLASTY;  Surgeon: Johnny Bridge, MD;  Location: [facility];  Service: Orthopedics;  Laterality: Right;     OB History   No obstetric history on file.      Home Medications    Prior to Admission medications   Medication Sig Start Date End Date Taking? Authorizing Provider  Acetaminophen (TYLENOL) 325 MG CAPS Take 650 mg by mouth daily as needed (for shoulder pain).     [provider]  apixaban (ELIQUIS) 2.5 MG TABS tablet Take 1 tablet (2.5 mg total) by mouth 2 (two) times daily. 11/21/18   Larey Dresser, MD  bumetanide (BUMEX) 1 MG tablet Take 1 tablet (1 mg total) by mouth 2 (two) times daily. Take with 2mg  bumex to equal 3mg  twice a day 05/10/19   Larey Dresser, MD  bumetanide (BUMEX) 2 MG tablet Take 1 tablet (2 mg total) by mouth 2 (two) times daily. Take with 1mg  tablet to equal 3mg  twice a day. Take additional dose on Sunday if weight 149lbs or greater. 05/10/19   Larey Dresser, MD  diclofenac sodium (VOLTAREN) 1 % GEL Apply 2 g topically 3 (three) times daily as needed (pain, inflammation). Patient not taking: Reported on 05/07/2019 04/16/16   Roxan Hockey, MD  fexofenadine (ALLEGRA) 180 MG tablet Take 180 mg by mouth daily. 03/12/19   [provider]  glucose blood test strip Use to test blood sugar twice daily ([employer] [facility]) 07/19/16   Larey Dresser, MD  KLOR-CON 20 MEQ packet TAKE 2 PACKETS AS DIRECTED TWICE DAILY AND 1 ADDITIONAL PACKET ON THE SAME DAY AS YOU TAKE METOLAZONE Patient not taking: Reported on 05/14/2019 04/30/19   Larey Dresser, MD  latanoprost (XALATAN) 0.005 % ophthalmic solution Place 1 drop into both  eyes at bedtime.  10/20/16   [provider]  metolazone (ZAROXOLYN) 2.5 MG tablet Take one tablet by mouth every Tuesday 04/30/19   Larey Dresser, MD  OXYGEN Inhale 2 L into the lungs.    [provider]  timolol (TIMOPTIC) 0.5 % ophthalmic solution Place 1 drop into both eyes daily.  07/29/15   [provider]    Family History Family History  Problem Relation Age of Onset  . Dementia Mother   . Coronary artery disease Mother   . Cancer Father        blood ? type  . Diabetes Maternal Grandfather   . Anuerysm Daughter        brain  . Colon cancer Neg Hx     Social History Social History   Tobacco Use  . Smoking status: Former Smoker    Packs/day: 1.00    Years: 45.00    Pack years: 45.00    Types: Cigarettes    Quit date: 07/24/1978    Years since quitting: 40.8  . Smokeless tobacco: Never Used  Substance Use Topics  . Alcohol use: Yes    Alcohol/week: 7.0 standard drinks    Types: 7 Glasses of wine per week    Comment: every day  . Drug use: No     Allergies   Hydrocodone, Percocet [oxycodone-acetaminophen], Eggs or egg-derived products, Mercurial derivatives, Penicillins, and Quinine derivatives   Review of Systems Review of Systems  Constitutional: Positive for appetite change, fatigue and unexpected weight change. Negative for fever.  HENT: Negative for congestion and trouble swallowing.   Respiratory: Negative for cough and shortness of breath.   Cardiovascular: Positive for leg swelling (improving). Negative for chest pain.  Gastrointestinal: Positive for constipation and vomiting. Negative for abdominal distention, abdominal pain, diarrhea and nausea.  Genitourinary: Negative for dysuria.  Musculoskeletal: Negative for back pain.  Skin: Negative for rash.  Neurological: Negative for speech difficulty, weakness, light-headedness, numbness and headaches.     Physical Exam Updated Vital Signs BP 95/83   Pulse 62   Temp (!)  97.3 F (36.3 C) (Oral)   Resp 11   SpO2 98%   Physical Exam Exam conducted with a chaperone present.  Constitutional:      Comments: Chronically ill appearing  HENT:     Head: Normocephalic and atraumatic.     Right Ear: External ear normal.     Left Ear: External ear normal.     Nose: Nose normal.     Mouth/Throat:  Mouth: Mucous membranes are dry.     Pharynx: Oropharynx is clear. No oropharyngeal exudate.  Eyes:     Pupils: Pupils are equal, round, and reactive to light.  Neck:     Musculoskeletal: Neck supple.  Cardiovascular:     Rate and Rhythm: Normal rate.     Comments: Cardiac device present in L chest Pulmonary:     Effort: Pulmonary effort is normal. No respiratory distress.     Breath sounds: Normal breath sounds. No wheezing, rhonchi or rales.  Chest:     Chest wall: No tenderness.  Abdominal:     General: There is no distension.     Palpations: Abdomen is soft.     Tenderness: There is no abdominal tenderness. There is no guarding or rebound.  Genitourinary:    Rectum: Guaiac result negative. External hemorrhoid present. Normal anal tone.     Comments: No gross blood on rectal exam Musculoskeletal:     Right lower leg: Edema (trace at ankle) present.     Left lower leg: Edema (trace at ankle) present.  Skin:    General: Skin is warm and dry.  Neurological:     General: No focal deficit present.     Mental Status: She is alert. She is disoriented.     Cranial Nerves: No cranial nerve deficit.     Sensory: No sensory deficit.     Motor: No weakness.     Coordination: Coordination normal.     Comments: Disorientated to place      ED Treatments / Results  Labs (all labs ordered are listed, but only abnormal results are displayed) Labs Reviewed  CBC WITH DIFFERENTIAL/PLATELET - Abnormal; Notable for the following components:      Result Value   WBC 3.2 (*)    Hemoglobin 15.5 (*)    HCT 46.8 (*)    RDW 17.8 (*)    All other components within  normal limits  URINALYSIS, ROUTINE W REFLEX MICROSCOPIC - Abnormal; Notable for the following components:   Leukocytes,Ua SMALL (*)    Bacteria, UA RARE (*)    All other components within normal limits  COMPREHENSIVE METABOLIC PANEL - Abnormal; Notable for the following components:   Sodium 130 (*)    Potassium 5.7 (*)    CO2 20 (*)    Glucose, Bld 112 (*)    BUN 60 (*)    Creatinine, Ser 1.71 (*)    Total Protein 8.2 (*)    AST 46 (*)    Total Bilirubin 3.9 (*)    GFR calc non Af Amer 27 (*)    GFR calc Af Amer 31 (*)    All other components within normal limits  MAGNESIUM - Abnormal; Notable for the following components:   Magnesium 2.8 (*)    All other components within normal limits  SARS CORONAVIRUS 2 (TAT 6-24 HRS)  LIPASE, BLOOD  BRAIN NATRIURETIC PEPTIDE  CBC  CBC  CBC  CBC  CBC  POC OCCULT BLOOD, ED    EKG EKG Interpretation  Date/Time:  Monday May 14 2019 16:03:48 EDT Ventricular Rate:  72 PR Interval:    QRS Duration: 134 QT Interval:  463 QTC Calculation: 507 R Axis:   -35 Text Interpretation:  Atrial-sensed ventricular-paced rhythm No further analysis attempted due to paced rhythm Confirmed by Gerlene Fee [phone]) on 05/14/2019 4:12:07 PM   Radiology Ct Abdomen Pelvis Wo Contrast  Result Date: 05/14/2019 CLINICAL DATA:  Nausea and vomiting EXAM: CT ABDOMEN AND  PELVIS WITHOUT CONTRAST TECHNIQUE: Multidetector CT imaging of the abdomen and pelvis was performed following the standard protocol without IV contrast. COMPARISON:  02/09/2014 FINDINGS: Lower chest: No focal infiltrate or sizable effusion is seen. Heart is enlarged in size. Pacing device is noted. Hepatobiliary: No focal liver abnormality is seen. No gallstones, gallbladder wall thickening, or biliary dilatation. Pancreas: Unremarkable. No pancreatic ductal dilatation or surrounding inflammatory changes. Spleen: Normal in size without focal abnormality. Adrenals/Urinary Tract: Adrenal glands  are within normal limits. Large upper pole renal cyst is noted on the right stable from the prior exam. No obstructive changes are seen. Atrophy of the lower pole is noted new from the prior exam likely related to prior infarct. Right kidney demonstrates no renal calculi or obstructive changes. The bladder is well distended. Stomach/Bowel: Scattered diverticular changes noted without evidence of diverticulitis. The appendix is not well visualized. No inflammatory changes to suggest appendicitis are noted. No gastric or small bowel abnormality is noted. Vascular/Lymphatic: Aortic atherosclerosis. No enlarged abdominal or pelvic lymph nodes. Reproductive: Status post hysterectomy. No adnexal masses. Other: No hernia is identified.  Mild changes of anasarca are noted. Musculoskeletal: Right hip replacement is noted. Degenerative changes of lumbar spine are seen. IMPRESSION: Lower pole renal atrophy on the left consistent with prior infarct. Mild changes of anasarca. Diverticulosis without diverticulitis. Electronically Signed   By: Inez Catalina M.D.   On: 05/14/2019 22:56   Ct Head Wo Contrast  Result Date: 05/14/2019 CLINICAL DATA:  Headache emesis EXAM: CT HEAD WITHOUT CONTRAST TECHNIQUE: Contiguous axial images were obtained from the base of the skull through the vertex without intravenous contrast. COMPARISON:  CT brain 01/07/2017 FINDINGS: Brain: No acute territorial infarction, hemorrhage or intracranial mass. Atrophy and mild small vessel ischemic changes of the white matter. Probable chronic lacunar infarcts in the basal ganglia. Stable ventricle size Vascular: No hyperdense vessels.  Carotid vascular calcification Skull: Normal. Negative for fracture or focal lesion. Sinuses/Orbits: Mucosal thickening in the ethmoid sinuses Other: None IMPRESSION: 1. No CT evidence for acute intracranial abnormality. 2. Atrophy and small vessel ischemic changes of the white matter Electronically Signed   By: Donavan Foil  M.D.   On: 05/14/2019 22:51   Dg Chest Portable 1 View  Result Date: 05/14/2019 CLINICAL DATA:  Shortness of breath EXAM: PORTABLE CHEST 1 VIEW COMPARISON:  08/17/2018 FINDINGS: Left-sided multi lead pacing device. Cardiomegaly with aortic atherosclerosis. No consolidation or effusion. No focal airspace disease. IMPRESSION: No active disease.  Stable cardiomegaly. Electronically Signed   By: Donavan Foil M.D.   On: 05/14/2019 17:00    Procedures Procedures (including critical care time)  Medications Ordered in ED Medications  0.9 %  sodium chloride infusion (has no administration in time range)  sodium chloride 0.9 % bolus 250 mL (has no administration in time range)  pantoprazole (PROTONIX) injection 40 mg (40 mg Intravenous Given 05/14/19 1714)     Initial Impression / Assessment and Plan / ED Course  I have reviewed the triage vital signs and the nursing notes.  Pertinent labs & imaging results that were available during my care of the patient were reviewed by me and considered in my medical decision making (see chart for details).        Given patient's history concern for upper GI bleed, possible electrolyte derangements, and possible injury from fall.  Patient given IV Protonix.  CT of the head unrevealing.  Laboratory studies concerning for AKI with elevated magnesium and potassium.  Patient does not have  EKG changes consistent with hyperkalemia.  Patient's AKI may be related to poor p.o. intake and increasing her diuretics recently.  CT of the abdomen without contrast obtained for further evaluation of patient's abdominal discomfort and reported constipation, final results are pending.  Guaiac negative stool on rectal exam without palpable stool ball noted.  Patient did not have any episodes of emesis while in the emergency department.  Patient admitted to hospitalist for further evaluation and management of her hematemesis and electrolyte abnormalities.  Patient seen and plan  discussed with Dr. Sedonia Small.  Final Clinical Impressions(s) / ED Diagnoses   Final diagnoses:  AKI (acute kidney injury) ([place])  Hyperkalemia  Hypermagnesemia  Fall, initial encounter  Hematemesis, presence of nausea not specified    ED Discharge Orders    None       Raziya Aveni, Missy Sabins, MD 05/15/19 0030    Maudie Flakes, MD 05/15/19 2106

## 2019-05-14 NOTE — H&P (Signed)
History and Physical    Kim Mullins YKZ:[id number] DOB: June 11, 1930 DOA: 05/14/2019  Referring MD/NP/PA:   PCP: Lin Landsman, MD   Patient coming from:  The patient is coming from home.  At baseline, pt is partially dependent for most of ADL.        Chief Complaint: Generalized weakness, coffee-ground emesis  HPI: Kim Mullins is a 85 y.o. female with medical history significant of hypertension, diet-controlled diabetes, asthma, on 2 to 3 L nasal cannula oxygen at home, stroke, GERD, OSA on CPAP, complete heart block, s/p of pacemaker placement, CHF with EF 35%, CKD-III, who presents with generalized weakness and coffee-ground emesis.  Per her granddaughter, pt has had approximately 5 episodes of coffee-ground emesis in the weekend.  Patient had nausea and generalized abdominal discomfort, which has resolved.  Currently patient denies nausea, vomiting, diarrhea.  She is constipated. Patient denies any chest pain, cough, shortness of breath.  She has chills, but no fever.  No symptoms of UTI.  Patient has generalized weakness, particularly in both legs.  She also has decreased appetite and poor oral intake recently. No unilateral numbness or tingling in extremities.  No facial droop or slurred speech. Her last dose of Eliquis was in this morning (9/14 AM). Per granddaughter, patient's mental status is at her baseline. She fell a week ago without significant injury per her granddaughter.  EGD by Dr. Levy Sjogren on 06/11/14:  1. There was a short stricture at the gastroesophageal junction; The stricture was dilated using a 52Fr Maloney dilator 2. EGD was otherwise normal 3. status post probable pH probe placement  ED Course: pt was found to have negative FOBT, pending COVID-19 test, WBC 3.2, hemoglobin 15.5, sodium 130, potassium 5.7, worsening renal function, temperature 97.3, soft blood pressure, heart rate 62, oxygen saturation 98% on room air.  Chest x-ray negative. CT-head  negative for acute intracranial abnormalities. Pt is placed on telemetry bed for observation.  CT-abd/pelbvis: 1. Lower pole renal atrophy on the left consistent with prior infarct. 2. Mild changes of anasarca. 3. Diverticulosis without diverticulitis.  Review of Systems:   General: no fevers, has chills, no body weight gain, has poor appetite, has fatigue HEENT: no blurry vision, hearing changes or sore throat Respiratory: no dyspnea, coughing, wheezing CV: no chest pain, no palpitations GI: no nausea, vomiting, abdominal pain, diarrhea, has constipation. Has coffee-ground emesis. GU: no dysuria, burning on urination, increased urinary frequency, hematuria  Ext: no leg edema Neuro: no unilateral weakness, numbness, or tingling, no vision change or hearing loss. Has fall. Skin: no rash, no skin tear. MSK: No muscle spasm, no deformity, no limitation of range of movement in spin Heme: No easy bruising.  Travel history: No recent long distant travel.  Allergy:  Allergies  Allergen Reactions   Hydrocodone Other (See Comments)    Took away all energy and made her stop eating food for short time   Percocet [Oxycodone-Acetaminophen] Other (See Comments)    Too away all energy and made her stop eating food for short time   Eggs Or Egg-Derived Products Hives and Rash   Mercurial Derivatives Itching and Rash   Penicillins Hives and Rash    Has patient had a PCN reaction causing immediate rash, facial/tongue/throat swelling, SOB or lightheadedness with hypotension: Yes Has patient had a PCN reaction causing severe rash involving mucus membranes or skin necrosis: No Has patient had a PCN reaction that required hospitalization: No Has patient had a PCN reaction occurring within the  last 10 years: Yes If all of the above answers are "NO", then may proceed with Cephalosporin use.   Quinine Derivatives Hives and Rash    Past Medical History:  Diagnosis Date   Anemia     occassionally   Arthritis    all over.   Asthma    Allergixc reaction to cats.   Atrial fibrillation (HCC)    Bowel obstruction (HCC)    Chronic systolic CHF (congestive heart failure) (HCC)    EF 30-35% by cath, echo 2016 EF 50%   Complete heart block ([place])    a. s/p MDT CRTP pacemaker   DCM (dilated cardiomyopathy) ([place]) 08/16/2014   normal coronary arteries on cath with EF 30-45%.  EF now 50% by echo 11/2014   Diabetes mellitus 2006   Diet and exercise controlled.   Dyspnea    GERD (gastroesophageal reflux disease)    occ   Glaucoma 08/17/2018   Hypertension    Left tibial fracture 2007   OSA (obstructive sleep apnea) 10/23/2014   Moderate with AHI 21/hr   Osteoarthritis of right shoulder region 06/26/2013   Stroke [facility])     Past Surgical History:  Procedure Laterality Date   ABDOMINAL HYSTERECTOMY  1969   BI-VENTRICULAR PACEMAKER INSERTION N/A 10/07/2014   MDT CRTP implanted by Dr Delanna Ahmadi [facility] STUDY N/A 06/11/2014   Procedure: [facility];  Surgeon: Inda Castle, MD;  Location: [facility] ENDOSCOPY;  Service: Endoscopy;  Laterality: N/A;   BUNIONECTOMY  1984   Bilateral   CARDIAC CATHETERIZATION     normal coronary arteries   [place]  2004   Bilateral   CATARACT EXTRACTION Right    early 2015   corn removal  1999   Bilateral feet   DILATION AND CURETTAGE OF UTERUS  1968   ESOPHAGOGASTRODUODENOSCOPY (EGD) WITH PROPOFOL N/A 06/11/2014   Procedure: ESOPHAGOGASTRODUODENOSCOPY (EGD) WITH PROPOFOL;  Surgeon: Inda Castle, MD;  Location: [facility] ENDOSCOPY;  Service: Endoscopy;  Laterality: N/A;   [place]   Surgery to fix Retainal detachment, bilateral   JOINT REPLACEMENT     LEFT AND RIGHT HEART CATHETERIZATION WITH CORONARY ANGIOGRAM N/A 08/29/2014   Procedure: LEFT AND RIGHT HEART CATHETERIZATION WITH CORONARY ANGIOGRAM;  Surgeon: Peter M Martinique, MD;  Location: [facility] CATH LAB;  Service: Cardiovascular;  Laterality: N/A;    MALONEY DILATION  06/11/2014   Procedure: Venia Minks DILATION;  Surgeon: Inda Castle, MD;  Location: [facility] ENDOSCOPY;  Service: Endoscopy;;   PILONIDAL CYST [place] CATH N/A 07/24/2018   Procedure: RIGHT HEART CATH;  Surgeon: Larey Dresser, MD;  Location: [facility] CV LAB;  Service: Cardiovascular;  Laterality: N/A;   TONSILLECTOMY  1942   TOTAL HIP ARTHROPLASTY  11/16/2011   Procedure: TOTAL HIP ARTHROPLASTY ANTERIOR APPROACH;  Surgeon: Hessie Dibble, MD;  Location: [facility];  Service: Orthopedics;  Laterality: Right;  DEPUY   TOTAL SHOULDER ARTHROPLASTY Right 06/26/2013   Procedure: TOTAL SHOULDER ARTHROPLASTY;  Surgeon: Johnny Bridge, MD;  Location: [place];  Service: Orthopedics;  Laterality: Right;    Social History:  reports that she quit smoking about 40 years ago. Her smoking use included cigarettes. She has a 45.00 pack-year smoking history. She has never used smokeless tobacco. She reports current alcohol use of about 7.0 standard drinks of alcohol per week. She reports that she does not use drugs.  Family History:  Family History  Problem Relation Age of Onset  Dementia Mother    Coronary artery disease Mother    Cancer Father        blood ? type   Diabetes Maternal Grandfather    Anuerysm Daughter        brain   Colon cancer Neg Hx      Prior to Admission medications   Medication Sig Start Date End Date Taking? Authorizing Provider  Acetaminophen (TYLENOL) 325 MG CAPS Take 650 mg by mouth daily as needed (for shoulder pain).     [provider]  apixaban (ELIQUIS) 2.5 MG TABS tablet Take 1 tablet (2.5 mg total) by mouth 2 (two) times daily. 11/21/18   Larey Dresser, MD  bumetanide (BUMEX) 1 MG tablet Take 1 tablet (1 mg total) by mouth 2 (two) times daily. Take with 2mg  bumex to equal 3mg  twice a day 05/10/19   Larey Dresser, MD  bumetanide (BUMEX) 2 MG tablet Take 1 tablet (2 mg total) by mouth 2 (two) times daily. Take with  1mg  tablet to equal 3mg  twice a day. Take additional dose on Sunday if weight 149lbs or greater. 05/10/19   Larey Dresser, MD  diclofenac sodium (VOLTAREN) 1 % GEL Apply 2 g topically 3 (three) times daily as needed (pain, inflammation). Patient not taking: Reported on 05/07/2019 04/16/16   Roxan Hockey, MD  fexofenadine (ALLEGRA) 180 MG tablet Take 180 mg by mouth daily. 03/12/19   [provider]  glucose blood test strip Use to test blood sugar twice daily ([employer] Smartview) 07/19/16   Larey Dresser, MD  KLOR-CON 20 MEQ packet TAKE 2 PACKETS AS DIRECTED TWICE DAILY AND 1 ADDITIONAL PACKET ON THE SAME DAY AS YOU TAKE METOLAZONE Patient not taking: Reported on 05/14/2019 04/30/19   Larey Dresser, MD  latanoprost (XALATAN) 0.005 % ophthalmic solution Place 1 drop into both eyes at bedtime.  10/20/16   [provider]  metolazone (ZAROXOLYN) 2.5 MG tablet Take one tablet by mouth every Tuesday 04/30/19   Larey Dresser, MD  OXYGEN Inhale 2 L into the lungs.    [provider]  timolol (TIMOPTIC) 0.5 % ophthalmic solution Place 1 drop into both eyes daily.  07/29/15   [provider]    Physical Exam: Vitals:   05/14/19 1730 05/14/19 2030 05/14/19 2106 05/14/19 2115  BP: 100/82 108/82 108/82 95/83  Pulse:   62   Resp: (!) 21 (!) 27 (!) 23 11  Temp:   (!) 97.3 F (36.3 C)   TempSrc:   Oral   SpO2:   98%    General: Not in acute distress.  Dry mucus and membrane HEENT:       Eyes: PERRL, EOMI, no scleral icterus.       ENT: No discharge from the ears and nose, no pharynx injection, no tonsillar enlargement.        Neck: No JVD, no bruit, no mass felt. Heme: No neck lymph node enlargement. Cardiac: S1/S2, RRR, No murmurs, No gallops or rubs. Respiratory: No rales, wheezing, rhonchi or rubs. GI: Soft, nondistended, nontender, no rebound pain, no organomegaly, BS present. GU: No hematuria Ext: No pitting leg edema bilaterally. 2+DP/PT pulse  bilaterally. Musculoskeletal: No joint deformities, No joint redness or warmth, no limitation of ROM in spin. Skin: No rashes.  Neuro: Alert, knows her own name and knows that she is in hospital, not oriented to time, cranial nerves II-XII grossly intact, moves all extremities. Psych: Patient is not psychotic, no suicidal or  hemocidal ideation.  Labs on Admission: I have personally reviewed following labs and imaging studies  CBC: Recent Labs  Lab 05/14/19 1615  WBC 3.2*  NEUTROABS 2.2  HGB 15.5*  HCT 46.8*  MCV 91.6  PLT 814   Basic Metabolic Panel: Recent Labs  Lab 05/10/19 1335 05/14/19 2112  NA 131* 130*  K 3.1* 5.7*  CL 88* 98  CO2 29 20*  GLUCOSE 149* 112*  BUN 71* 60*  CREATININE 1.30* 1.71*  CALCIUM 9.3 9.3  MG  --  2.8*   GFR: CrCl cannot be calculated (Unknown ideal weight.). Liver Function Tests: Recent Labs  Lab 05/14/19 2112  AST 46*  ALT 25  ALKPHOS 118  BILITOT 3.9*  PROT 8.2*  ALBUMIN 3.6   Recent Labs  Lab 05/14/19 2112  LIPASE 31   No results for input(s): AMMONIA in the last 168 hours. Coagulation Profile: No results for input(s): INR, PROTIME in the last 168 hours. Cardiac Enzymes: No results for input(s): CKTOTAL, CKMB, CKMBINDEX, TROPONINI in the last 168 hours. BNP (last 3 results) No results for input(s): PROBNP in the last 8760 hours. HbA1C: No results for input(s): HGBA1C in the last 72 hours. CBG: No results for input(s): GLUCAP in the last 168 hours. Lipid Profile: No results for input(s): CHOL, HDL, LDLCALC, TRIG, CHOLHDL, LDLDIRECT in the last 72 hours. Thyroid Function Tests: No results for input(s): TSH, T4TOTAL, FREET4, T3FREE, THYROIDAB in the last 72 hours. Anemia Panel: No results for input(s): VITAMINB12, FOLATE, FERRITIN, TIBC, IRON, RETICCTPCT in the last 72 hours. Urine analysis:    Component Value Date/Time   COLORURINE YELLOW 05/14/2019 2105   APPEARANCEUR CLEAR 05/14/2019 2105   LABSPEC 1.010  05/14/2019 2105   PHURINE 8.0 05/14/2019 2105   GLUCOSEU NEGATIVE 05/14/2019 2105   GLUCOSEU NEGATIVE 07/10/2014 1517   HGBUR NEGATIVE 05/14/2019 2105   BILIRUBINUR NEGATIVE 05/14/2019 2105   KETONESUR NEGATIVE 05/14/2019 2105   PROTEINUR NEGATIVE 05/14/2019 2105   UROBILINOGEN 0.2 10/06/2014 1552   NITRITE NEGATIVE 05/14/2019 2105   LEUKOCYTESUR SMALL (A) 05/14/2019 2105   Sepsis Labs: @LABRCNTIP (procalcitonin:4,lacticidven:4) ) Recent Results (from the past 240 hour(s))  SARS CORONAVIRUS 2 (TAT 6-24 HRS) Nasopharyngeal Nasopharyngeal Swab     Status: None   Collection Time: 05/14/19 10:10 PM   Specimen: Nasopharyngeal Swab  Result Value Ref Range Status   SARS Coronavirus 2 NEGATIVE NEGATIVE Final    Comment: (NOTE) SARS-CoV-2 target nucleic acids are NOT DETECTED. The SARS-CoV-2 RNA is generally detectable in upper and lower respiratory specimens during the acute phase of infection. Negative results do not preclude SARS-CoV-2 infection, do not rule out co-infections with other pathogens, and should not be used as the sole basis for treatment or other patient management decisions. Negative results must be combined with clinical observations, patient history, and epidemiological information. The expected result is Negative. Fact Sheet for Patients: SugarRoll.be Fact Sheet for Healthcare Providers: https://www.woods-mathews.com/ This test is not yet approved or cleared by the [place] FDA and  has been authorized for detection and/or diagnosis of SARS-CoV-2 by FDA under an Emergency Use Authorization (EUA). This EUA will remain  in effect (meaning this test can be used) for the duration of the COVID-19 declaration under Section 56 4(b)(1) of the Act, 21 U.S.C. section 360bbb-3(b)(1), unless the authorization is terminated or revoked sooner. Performed at [facility], [place] [address] [place], [place] [address]       Radiological Exams on Admission: Ct Abdomen Pelvis Wo Contrast  Result Date: 05/14/2019 CLINICAL DATA:  Nausea and vomiting EXAM: CT ABDOMEN AND PELVIS WITHOUT CONTRAST TECHNIQUE: Multidetector CT imaging of the abdomen and pelvis was performed following the standard protocol without IV contrast. COMPARISON:  02/09/2014 FINDINGS: Lower chest: No focal infiltrate or sizable effusion is seen. Heart is enlarged in size. Pacing device is noted. Hepatobiliary: No focal liver abnormality is seen. No gallstones, gallbladder wall thickening, or biliary dilatation. Pancreas: Unremarkable. No pancreatic ductal dilatation or surrounding inflammatory changes. Spleen: Normal in size without focal abnormality. Adrenals/Urinary Tract: Adrenal glands are within normal limits. Large upper pole renal cyst is noted on the right stable from the prior exam. No obstructive changes are seen. Atrophy of the lower pole is noted new from the prior exam likely related to prior infarct. Right kidney demonstrates no renal calculi or obstructive changes. The bladder is well distended. Stomach/Bowel: Scattered diverticular changes noted without evidence of diverticulitis. The appendix is not well visualized. No inflammatory changes to suggest appendicitis are noted. No gastric or small bowel abnormality is noted. Vascular/Lymphatic: Aortic atherosclerosis. No enlarged abdominal or pelvic lymph nodes. Reproductive: Status post hysterectomy. No adnexal masses. Other: No hernia is identified.  Mild changes of anasarca are noted. Musculoskeletal: Right hip replacement is noted. Degenerative changes of lumbar spine are seen. IMPRESSION: Lower pole renal atrophy on the left consistent with prior infarct. Mild changes of anasarca. Diverticulosis without diverticulitis. Electronically Signed   By: Inez Catalina M.D.   On: 05/14/2019 22:56   Ct Head Wo Contrast  Result Date: 05/14/2019 CLINICAL DATA:  Headache emesis EXAM: CT HEAD WITHOUT  CONTRAST TECHNIQUE: Contiguous axial images were obtained from the base of the skull through the vertex without intravenous contrast. COMPARISON:  CT brain 01/07/2017 FINDINGS: Brain: No acute territorial infarction, hemorrhage or intracranial mass. Atrophy and mild small vessel ischemic changes of the white matter. Probable chronic lacunar infarcts in the basal ganglia. Stable ventricle size Vascular: No hyperdense vessels.  Carotid vascular calcification Skull: Normal. Negative for fracture or focal lesion. Sinuses/Orbits: Mucosal thickening in the ethmoid sinuses Other: None IMPRESSION: 1. No CT evidence for acute intracranial abnormality. 2. Atrophy and small vessel ischemic changes of the white matter Electronically Signed   By: Donavan Foil M.D.   On: 05/14/2019 22:51   Dg Chest Portable 1 View  Result Date: 05/14/2019 CLINICAL DATA:  Shortness of breath EXAM: PORTABLE CHEST 1 VIEW COMPARISON:  08/17/2018 FINDINGS: Left-sided multi lead pacing device. Cardiomegaly with aortic atherosclerosis. No consolidation or effusion. No focal airspace disease. IMPRESSION: No active disease.  Stable cardiomegaly. Electronically Signed   By: Donavan Foil M.D.   On: 05/14/2019 17:00     EKG: Independently reviewed.  Paced rhythm, QTC 507  Assessment/Plan Principal Problem:   Hematemesis Active Problems:   Benign essential HTN   Pacemaker   History of stroke   Atrial fibrillation (HCC)   GERD (gastroesophageal reflux disease)   Chronic systolic CHF (congestive heart failure) (HCC)   Acute renal failure superimposed on stage 3 chronic kidney disease (HCC)   Fall   Hyponatremia   Hyperkalemia   Hematemesis: per her granddaughter, patient had 5 episodes of coffee-ground emesis.  Her hemoglobin is stable, 15.5. - will place in tele bed for obs - hold Eliquis - NPO  - IVF: 250L NS bolus, then at 75 mL/hr - Start IV pantoprazole gtt - Zofran IV for nausea - Avoid NSAIDs and SQ heparin - Maintain  IV access (2 large bore IVs if possible). - Monitor  closely and follow q6h cbc, transfuse as necessary, if Hgb<7.0 - LaB: INR, PTT and type screen  Hyperkalemia: K 5.7.  EKG showed paced rhythm, no T wave peaking. Due to hematemesis, will not give Kayexalate. -will treat with 5 units of NovoLog, D50, 50 mEq of sodium bicarbonate -IV fluid: 250 cc normal saline, followed by 75 cc/h -repeat BMP at 2:30 AM -f/u by BMP  HTN: Blood pressures are soft -hold home Bumex due to worsening renal function -IV hydralazine prn  Atrial Fibrillation: CHA2DS2-VASc Score is 8, needs oral anticoagulation. Patient is on Eliquis at home. Heart rate is well controlled. Pt is not no node blockers. -hold Eliquis  History of stroke: -hold eliquis due to hematemesis  GERD (gastroesophageal reflux disease): -on IV protonix  Chronic systolic CHF (congestive heart failure) ([place]): 2D echo 07/21/2018 showed EF of 35-40%.  Patient does not have leg edema JVD.  Clinically patient is dry. -Hold diuretics (Bumex and metolazone) due to worsening renal function  Acute renal failure superimposed on stage 3 chronic kidney disease ([place]): Baseline creatinine 1.2-1.3.  Her creatinine is 1.71, BUN 60. Likely due to prerenal secondary to dehydration and continuation of diuretics - IVF as above - Follow up renal function by BMP - Avoid using renal toxic medications, hypotension and contrast dye (or carefully use) - Hold Bumex and metolazone  Fall: CT-head negative for acute intracranial abnormalities. -pt/Ot  Hyponatremia: Na 130. Mental status is at her baseline.  Likely due to poor oral intake, and continuation of diuretics. -IVF NS as above -f/u by BMP     DVT ppx: SCD Code Status: Full code per her granddaughter. Family Communication: Yes, patient's granddaughter at bed side Disposition Plan:  Anticipate discharge back to previous home environment Consults called: none Admission status: Obs / tele    Date  of Service 05/15/2019    [facility]   If 7PM-7AM, please contact night-coverage [email or web address] Password [facility] 05/15/2019, 2:48 AM

## 2019-05-14 NOTE — ED Notes (Signed)
Granddaughter at bedside

## 2019-05-14 NOTE — ED Triage Notes (Signed)
Pt arrived via [facility] from home where pt lives with daughter, per EMS. Family told EMS pt has been increasingly getting weak and had 2 episodes of emesis over weekend with coffee ground texture. Pt is alert and oriented to self, DOB only. EMS v/s 107/63, hr88, 94%ra, T:98.2, cbg 201 (not DM). Hx of afib, CHF.

## 2019-05-14 NOTE — Progress Notes (Signed)
Paramedicine Encounter    Patient ID: Kim Mullins, female    DOB: November 27, 1929, 85 y.o.   MRN: [id number]   Patient Care Team: Lin Landsman, MD as PCP - General (Family Medicine) Larey Dresser, MD as PCP - Advanced Heart Failure (Cardiology) Jorge Ny, LCSW as Social Worker (Licensed Clinical Social Worker)  Patient Active Problem List   Diagnosis Date Noted  . Obesity (BMI 30-39.9) 10/20/2018  . Acute on chronic heart failure ([place]) 08/19/2018  . Palliative care encounter   . Palliative care by specialist   . Acute respiratory failure with hypoxia ([place]) 08/18/2018  . Diabetes ([place]) 08/18/2018  . CKD (chronic kidney disease) 08/18/2018  . Amyloidosis ([place]) 08/18/2018  . Acute on chronic combined systolic and diastolic heart failure ([place]) 08/17/2018  . GERD (gastroesophageal reflux disease) 08/17/2018  . Elevated troponin 08/17/2018  . Glaucoma 08/17/2018  . Hyperbilirubinemia 08/17/2018  . CHF (congestive heart failure) ([place]) 07/18/2018  . Gait abnormality 05/03/2017  . Hypotension 04/14/2016  . Hypotension due to drugs   . Chronic anticoagulation   . TIA (transient ischemic attack) 04/12/2016  . UTI (lower urinary tract infection) 04/12/2016  . AKI (acute kidney injury) ([place]) 04/12/2016  . Atrial fibrillation ([place]) 09/12/2015  . Occipital infarction ([place])   . History of stroke 06/18/2015  . Homonymous hemianopsia   . Pacemaker 01/21/2015  . OSA (obstructive sleep apnea) 10/23/2014  . Dizziness 10/21/2014  . Complete heart block ([place]) 10/06/2014  . Benign essential HTN 10/04/2014  . Acute on chronic systolic CHF (congestive heart failure) ([place]) 10/04/2014  . Abnormal cardiovascular stress test 08/27/2014  . Pulmonary HTN ([place]) 08/16/2014  . DCM (dilated cardiomyopathy) ([place]) 08/16/2014  . Cough 07/13/2014  . Esophageal stricture 06/11/2014  . Nonspecific (abnormal) findings on radiological and other examination of gastrointestinal tract 05/29/2014  .  Dyspnea 04/29/2014  . Osteoarthritis of right shoulder region 06/26/2013  . DJD (degenerative joint disease) of hip 11/16/2011    Class: Present on Admission    Current Outpatient Medications:  .  apixaban (ELIQUIS) 2.5 MG TABS tablet, Take 1 tablet (2.5 mg total) by mouth 2 (two) times daily., Disp: 60 tablet, Rfl: 11 .  bumetanide (BUMEX) 1 MG tablet, Take 1 tablet (1 mg total) by mouth 2 (two) times daily. Take with 2mg  bumex to equal 3mg  twice a day, Disp: 60 tablet, Rfl: 6 .  bumetanide (BUMEX) 2 MG tablet, Take 1 tablet (2 mg total) by mouth 2 (two) times daily. Take with 1mg  tablet to equal 3mg  twice a day. Take additional dose on Sunday if weight 149lbs or greater., Disp: 60 tablet, Rfl: 6 .  fexofenadine (ALLEGRA) 180 MG tablet, Take 180 mg by mouth daily., Disp: , Rfl:  .  glucose blood test strip, Use to test blood sugar twice daily ([employer] Smartview), Disp: 100 each, Rfl: 0 .  latanoprost (XALATAN) 0.005 % ophthalmic solution, Place 1 drop into both eyes at bedtime. , Disp: , Rfl: 11 .  metolazone (ZAROXOLYN) 2.5 MG tablet, Take one tablet by mouth every Tuesday, Disp: 12 tablet, Rfl: 1 .  OXYGEN, Inhale 2 L into the lungs., Disp: , Rfl:  .  timolol (TIMOPTIC) 0.5 % ophthalmic solution, Place 1 drop into both eyes daily. , Disp: , Rfl: 11 .  Acetaminophen (TYLENOL) 325 MG CAPS, Take 650 mg by mouth daily as needed (for shoulder pain). , Disp: , Rfl:  .  diclofenac sodium (VOLTAREN) 1 % GEL, Apply 2 g topically 3 (three) times  daily as needed (pain, inflammation). (Patient not taking: Reported on 05/07/2019), Disp: 100 g, Rfl: 0 .  KLOR-CON 20 MEQ packet, TAKE 2 PACKETS AS DIRECTED TWICE DAILY AND 1 ADDITIONAL PACKET ON THE SAME DAY AS YOU TAKE METOLAZONE (Patient not taking: Reported on 05/14/2019), Disp: 270 each, Rfl: 3 Allergies  Allergen Reactions  . Hydrocodone Other (See Comments)    Took away all energy and made her stop eating food for short time  . Percocet  [Oxycodone-Acetaminophen] Other (See Comments)    Too away all energy and made her stop eating food for short time  . Eggs Or Egg-Derived Products Hives and Rash  . Mercurial Derivatives Itching and Rash  . Penicillins Hives and Rash    Has patient had a PCN reaction causing immediate rash, facial/tongue/throat swelling, SOB or lightheadedness with hypotension: Yes Has patient had a PCN reaction causing severe rash involving mucus membranes or skin necrosis: No Has patient had a PCN reaction that required hospitalization: No Has patient had a PCN reaction occurring within the last 10 years: Yes If all of the above answers are "NO", then may proceed with Cephalosporin use.  . Quinine Derivatives Hives and Rash     Social History   Socioeconomic History  . Marital status: Widowed    Spouse name: Not on file  . Number of children: 0  . Years of education: Not on file  . Highest education level: Doctorate  Occupational History  . Occupation: retired  Scientific laboratory technician  . Financial resource strain: Not hard at all  . Food insecurity    Worry: Never true    Inability: Never true  . Transportation needs    Medical: No    Non-medical: No  Tobacco Use  . Smoking status: Former Smoker    Packs/day: 1.00    Years: 45.00    Pack years: 45.00    Types: Cigarettes    Quit date: 07/24/1978    Years since quitting: 40.8  . Smokeless tobacco: Never Used  Substance and Sexual Activity  . Alcohol use: Yes    Alcohol/week: 7.0 standard drinks    Types: 7 Glasses of wine per week    Comment: every day  . Drug use: No  . Sexual activity: Not Currently  Lifestyle  . Physical activity    Days per week: Not on file    Minutes per session: Not on file  . Stress: Not on file  Relationships  . Social Herbalist on phone: Not on file    Gets together: Not on file    Attends religious service: Not on file    Active member of club or organization: Not on file    Attends meetings of  clubs or organizations: Not on file    Relationship status: Not on file  . Intimate partner violence    Fear of current or ex partner: Not on file    Emotionally abused: Not on file    Physically abused: Not on file    Forced sexual activity: Not on file  Other Topics Concern  . Not on file  Social History Narrative   Lives alone    Physical Exam Vitals signs reviewed.  HENT:     Head: Normocephalic.     Nose: Nose normal.     Mouth/Throat:     Mouth: Mucous membranes are dry.     Pharynx: Oropharynx is clear.  Eyes:     Pupils: Pupils are equal, round, and  reactive to light.  Cardiovascular:     Rate and Rhythm: Normal rate. Rhythm irregular.     Pulses: Normal pulses.     Heart sounds: Normal heart sounds.  Pulmonary:     Effort: Pulmonary effort is normal.     Breath sounds: Normal breath sounds.  Abdominal:     General: Abdomen is flat. There is no distension.     Palpations: Abdomen is soft.  Musculoskeletal: Normal range of motion.     Right lower leg: No edema.     Left lower leg: No edema.  Skin:    General: Skin is warm and dry.     Capillary Refill: Capillary refill takes less than 2 seconds.  Neurological:     Mental Status: She is alert. Mental status is at baseline.  Psychiatric:        Mood and Affect: Mood normal.    Arrived for home visit for [facility]. Colletta Maryland was laying supine and complaining of increased fatigue and overall weakness with lack of appetite. Colletta Maryland also reports vomiting Friday and Saturday with coffee ground emesis. Patient has had increased issues with constipation over a number of months but has been using OTC meds for same with mild relief. Patient states she "does not feel good". I assessed patient and she appeared very lethargic. Vitals were obtained and are as noted. Patient expressed she felt the need to go to the hospital for further evaluation. I agreed with same and called a unit out for transport.Colletta Maryland was assisted up and moved to the  living room via walker. Just standing to sitting Colletta Maryland got very short of breath and fatigued. [facility] arrived to transport patient to [facility] for further evaluation.    Pills verified and medications placed in pill box. Visit planned for next week.   CBG- 201 (fasting 6 hours)  Weight- 115lbs       Future Appointments  Date Time Provider [place]  05/29/2019 11:30 AM [facility] PA/NP [facility] None  07/04/2019 11:15 AM Gardiner Barefoot, DPM [facility]     ACTION: Home visit completed Next visit planned for one wek

## 2019-05-14 NOTE — ED Notes (Addendum)
Pt has hx of DM, per family.

## 2019-05-15 DIAGNOSIS — N184 Chronic kidney disease, stage 4 (severe): Secondary | ICD-10-CM | POA: Diagnosis not present

## 2019-05-15 DIAGNOSIS — I428 Other cardiomyopathies: Secondary | ICD-10-CM | POA: Diagnosis not present

## 2019-05-15 DIAGNOSIS — I272 Pulmonary hypertension, unspecified: Secondary | ICD-10-CM | POA: Diagnosis present

## 2019-05-15 DIAGNOSIS — I480 Paroxysmal atrial fibrillation: Secondary | ICD-10-CM | POA: Diagnosis not present

## 2019-05-15 DIAGNOSIS — E1122 Type 2 diabetes mellitus with diabetic chronic kidney disease: Secondary | ICD-10-CM | POA: Diagnosis present

## 2019-05-15 DIAGNOSIS — J9611 Chronic respiratory failure with hypoxia: Secondary | ICD-10-CM | POA: Diagnosis not present

## 2019-05-15 DIAGNOSIS — I5084 End stage heart failure: Secondary | ICD-10-CM | POA: Diagnosis present

## 2019-05-15 DIAGNOSIS — I442 Atrioventricular block, complete: Secondary | ICD-10-CM | POA: Diagnosis not present

## 2019-05-15 DIAGNOSIS — I4819 Other persistent atrial fibrillation: Secondary | ICD-10-CM | POA: Diagnosis not present

## 2019-05-15 DIAGNOSIS — N183 Chronic kidney disease, stage 3 (moderate): Secondary | ICD-10-CM | POA: Diagnosis not present

## 2019-05-15 DIAGNOSIS — K920 Hematemesis: Secondary | ICD-10-CM | POA: Diagnosis not present

## 2019-05-15 DIAGNOSIS — I420 Dilated cardiomyopathy: Secondary | ICD-10-CM | POA: Diagnosis not present

## 2019-05-15 DIAGNOSIS — N179 Acute kidney failure, unspecified: Secondary | ICD-10-CM | POA: Diagnosis not present

## 2019-05-15 DIAGNOSIS — I4811 Longstanding persistent atrial fibrillation: Secondary | ICD-10-CM | POA: Diagnosis not present

## 2019-05-15 DIAGNOSIS — Z9981 Dependence on supplemental oxygen: Secondary | ICD-10-CM | POA: Diagnosis not present

## 2019-05-15 DIAGNOSIS — Z20828 Contact with and (suspected) exposure to other viral communicable diseases: Secondary | ICD-10-CM | POA: Diagnosis not present

## 2019-05-15 DIAGNOSIS — I4891 Unspecified atrial fibrillation: Secondary | ICD-10-CM | POA: Diagnosis not present

## 2019-05-15 DIAGNOSIS — I081 Rheumatic disorders of both mitral and tricuspid valves: Secondary | ICD-10-CM | POA: Diagnosis present

## 2019-05-15 DIAGNOSIS — E875 Hyperkalemia: Secondary | ICD-10-CM | POA: Diagnosis present

## 2019-05-15 DIAGNOSIS — I5022 Chronic systolic (congestive) heart failure: Secondary | ICD-10-CM | POA: Diagnosis not present

## 2019-05-15 DIAGNOSIS — E860 Dehydration: Secondary | ICD-10-CM | POA: Diagnosis present

## 2019-05-15 DIAGNOSIS — N170 Acute kidney failure with tubular necrosis: Secondary | ICD-10-CM | POA: Diagnosis not present

## 2019-05-15 DIAGNOSIS — E1129 Type 2 diabetes mellitus with other diabetic kidney complication: Secondary | ICD-10-CM | POA: Diagnosis not present

## 2019-05-15 DIAGNOSIS — Z515 Encounter for palliative care: Secondary | ICD-10-CM | POA: Diagnosis not present

## 2019-05-15 DIAGNOSIS — G4733 Obstructive sleep apnea (adult) (pediatric): Secondary | ICD-10-CM | POA: Diagnosis not present

## 2019-05-15 DIAGNOSIS — I130 Hypertensive heart and chronic kidney disease with heart failure and stage 1 through stage 4 chronic kidney disease, or unspecified chronic kidney disease: Secondary | ICD-10-CM | POA: Diagnosis not present

## 2019-05-15 DIAGNOSIS — E871 Hypo-osmolality and hyponatremia: Secondary | ICD-10-CM | POA: Diagnosis not present

## 2019-05-15 DIAGNOSIS — E854 Organ-limited amyloidosis: Secondary | ICD-10-CM | POA: Diagnosis not present

## 2019-05-15 DIAGNOSIS — I43 Cardiomyopathy in diseases classified elsewhere: Secondary | ICD-10-CM | POA: Diagnosis not present

## 2019-05-15 DIAGNOSIS — R627 Adult failure to thrive: Secondary | ICD-10-CM | POA: Diagnosis present

## 2019-05-15 DIAGNOSIS — R531 Weakness: Secondary | ICD-10-CM | POA: Diagnosis present

## 2019-05-15 DIAGNOSIS — I5023 Acute on chronic systolic (congestive) heart failure: Secondary | ICD-10-CM | POA: Diagnosis not present

## 2019-05-15 DIAGNOSIS — E11649 Type 2 diabetes mellitus with hypoglycemia without coma: Secondary | ICD-10-CM | POA: Diagnosis not present

## 2019-05-15 DIAGNOSIS — J45909 Unspecified asthma, uncomplicated: Secondary | ICD-10-CM | POA: Diagnosis not present

## 2019-05-15 DIAGNOSIS — I10 Essential (primary) hypertension: Secondary | ICD-10-CM | POA: Diagnosis not present

## 2019-05-15 LAB — BASIC METABOLIC PANEL
Anion gap: 14 (ref 5–15)
BUN: 55 mg/dL — ABNORMAL HIGH (ref 8–23)
CO2: 19 mmol/L — ABNORMAL LOW (ref 22–32)
Calcium: 8.7 mg/dL — ABNORMAL LOW (ref 8.9–10.3)
Chloride: 98 mmol/L (ref 98–111)
Creatinine, Ser: 1.61 mg/dL — ABNORMAL HIGH (ref 0.44–1.00)
GFR calc Af Amer: 33 mL/min — ABNORMAL LOW (ref >60)
GFR calc non Af Amer: 29 mL/min — ABNORMAL LOW (ref >60)
Glucose, Bld: 85 mg/dL (ref 70–99)
Potassium: 4.4 mmol/L (ref 3.5–5.1)
Sodium: 131 mmol/L — ABNORMAL LOW (ref 135–145)

## 2019-05-15 LAB — CBG MONITORING, ED
Glucose-Capillary: 77 mg/dL (ref 70–99)
Glucose-Capillary: 67 mg/dL — ABNORMAL LOW (ref 70–99)
Glucose-Capillary: 71 mg/dL (ref 70–99)
Glucose-Capillary: 69 mg/dL — ABNORMAL LOW (ref 70–99)
Glucose-Capillary: 108 mg/dL — ABNORMAL HIGH (ref 70–99)

## 2019-05-15 LAB — CBC
HCT: 43.1 % (ref 36.0–46.0)
Hemoglobin: 14.1 g/dL (ref 12.0–15.0)
MCH: 30.1 pg (ref 26.0–34.0)
MCHC: 32.7 g/dL (ref 30.0–36.0)
MCV: 91.9 fL (ref 80.0–100.0)
Platelets: 137 10*3/uL — ABNORMAL LOW (ref 150–400)
RBC: 4.69 MIL/uL (ref 3.87–5.11)
RDW: 17.7 % — ABNORMAL HIGH (ref 11.5–15.5)
WBC: 3.3 10*3/uL — ABNORMAL LOW (ref 4.0–10.5)
nRBC: 0 % (ref 0.0–0.2)

## 2019-05-15 LAB — TYPE AND SCREEN
ABO/RH(D): B POS
Antibody Screen: NEGATIVE

## 2019-05-15 LAB — SARS CORONAVIRUS 2 (TAT 6-24 HRS): SARS Coronavirus 2: NEGATIVE

## 2019-05-15 LAB — BRAIN NATRIURETIC PEPTIDE: B Natriuretic Peptide: 1217.6 pg/mL — ABNORMAL HIGH (ref 0.0–100.0)

## 2019-05-15 LAB — PROTIME-INR
INR: 1.7 — ABNORMAL HIGH (ref 0.8–1.2)
Prothrombin Time: 19.7 seconds — ABNORMAL HIGH (ref 11.4–15.2)

## 2019-05-15 LAB — APTT: aPTT: 37 seconds — ABNORMAL HIGH (ref 24–36)

## 2019-05-15 LAB — CK: Total CK: 43 U/L (ref 38–234)

## 2019-05-15 MED ORDER — SODIUM CHLORIDE 0.9 % IV SOLN: INTRAVENOUS | Status: DC

## 2019-05-15 MED ORDER — ONDANSETRON HCL 4 MG/2ML IJ SOLN
4.0000 mg | Freq: Four times a day (QID) | INTRAMUSCULAR | Status: DC | PRN
  Administered 2019-05-22: 23:00:00 4 mg via INTRAVENOUS
  Filled 2019-05-15: qty 2

## 2019-05-15 MED ORDER — TIMOLOL MALEATE 0.5 % OP SOLN
1.0000 [drp] | Freq: Every day | OPHTHALMIC | Status: DC
  Administered 2019-05-15 – 2019-05-25 (×11): 1 [drp] via OPHTHALMIC
  Filled 2019-05-15 (×2): qty 5

## 2019-05-15 MED ORDER — SODIUM CHLORIDE 0.9 % IV SOLN
8.0000 mg/h | INTRAVENOUS | Status: DC
  Administered 2019-05-15: 04:00:00 8 mg/h via INTRAVENOUS
  Filled 2019-05-15 (×2): qty 80

## 2019-05-15 MED ORDER — PANTOPRAZOLE SODIUM 40 MG IV SOLR
40.0000 mg | Freq: Two times a day (BID) | INTRAVENOUS | Status: DC
  Administered 2019-05-16 – 2019-05-20 (×9): 40 mg via INTRAVENOUS
  Filled 2019-05-15 (×9): qty 40

## 2019-05-15 MED ORDER — ACETAMINOPHEN 650 MG RE SUPP: 650.0000 mg | Freq: Four times a day (QID) | RECTAL | Status: DC | PRN

## 2019-05-15 MED ORDER — ACETAMINOPHEN 325 MG PO TABS
650.0000 mg | ORAL_TABLET | Freq: Four times a day (QID) | ORAL | Status: DC | PRN
  Administered 2019-05-23: 22:00:00 650 mg via ORAL
  Filled 2019-05-15 (×2): qty 2

## 2019-05-15 MED ORDER — ALBUTEROL SULFATE (2.5 MG/3ML) 0.083% IN NEBU: 2.5000 mg | INHALATION_SOLUTION | RESPIRATORY_TRACT | Status: DC | PRN

## 2019-05-15 MED ORDER — HYDRALAZINE HCL 20 MG/ML IJ SOLN: 5.0000 mg | INTRAMUSCULAR | Status: DC | PRN

## 2019-05-15 MED ORDER — ONDANSETRON HCL 4 MG PO TABS: 4.0000 mg | ORAL_TABLET | Freq: Four times a day (QID) | ORAL | Status: DC | PRN

## 2019-05-15 MED ORDER — LATANOPROST 0.005 % OP SOLN
1.0000 [drp] | Freq: Every day | OPHTHALMIC | Status: DC
  Administered 2019-05-15 – 2019-05-24 (×10): 1 [drp] via OPHTHALMIC
  Filled 2019-05-15: qty 2.5

## 2019-05-15 MED ORDER — BUMETANIDE 2 MG PO TABS
4.0000 mg | ORAL_TABLET | Freq: Two times a day (BID) | ORAL | Status: DC
  Administered 2019-05-16 – 2019-05-25 (×19): 4 mg via ORAL
  Filled 2019-05-15 (×22): qty 2

## 2019-05-15 MED ORDER — DEXTROSE 50 % IV SOLN: 50.0000 mL | Freq: Once | INTRAVENOUS | Status: DC

## 2019-05-15 MED ORDER — PANTOPRAZOLE SODIUM 40 MG IV SOLR: 40.0000 mg | Freq: Two times a day (BID) | INTRAVENOUS | Status: DC

## 2019-05-15 MED ORDER — INSULIN ASPART 100 UNIT/ML IV SOLN
5.0000 [IU] | Freq: Once | INTRAVENOUS | Status: AC
  Administered 2019-05-15: 01:00:00 5 [IU] via INTRAVENOUS

## 2019-05-15 MED ORDER — SENNOSIDES-DOCUSATE SODIUM 8.6-50 MG PO TABS
1.0000 | ORAL_TABLET | Freq: Two times a day (BID) | ORAL | Status: DC
  Administered 2019-05-15 – 2019-05-17 (×4): 1 via ORAL
  Filled 2019-05-15 (×5): qty 1

## 2019-05-15 MED ORDER — DEXTROSE 50 % IV SOLN
50.0000 mL | Freq: Once | INTRAVENOUS | Status: AC
  Administered 2019-05-15: 01:00:00 50 mL via INTRAVENOUS

## 2019-05-15 MED ORDER — SODIUM BICARBONATE 8.4 % IV SOLN
50.0000 meq | Freq: Once | INTRAVENOUS | Status: AC
  Administered 2019-05-15: 01:00:00 50 meq via INTRAVENOUS

## 2019-05-15 MED ORDER — LORATADINE 10 MG PO TABS
10.0000 mg | ORAL_TABLET | Freq: Every day | ORAL | Status: DC
  Administered 2019-05-15 – 2019-05-25 (×11): 10 mg via ORAL
  Filled 2019-05-15 (×11): qty 1

## 2019-05-15 NOTE — ED Notes (Signed)
Pt family member stated pt just ate right before I checked her CBG  Will ask nurse to see if needs to be re-checked in 52min

## 2019-05-15 NOTE — Evaluation (Signed)
Physical Therapy Evaluation Patient Details Name: Kim Mullins MRN: [id number] DOB: 22-Sep-1929 Today's Date: 05/15/2019   History of Present Illness  85yo female presentijng with general weakness, coffee ground emesis. Recent fall. PMH HTN, DM, asthma on home O2, CVA, complete heart block s/p ICD, CHF with EF 35%, CKD stage III, A-fib, hx tibial fx, cardiac cath, THR, total shoulder arthroscopy  Clinical Impression   Patient received in bed, grand-daughter present and assisted in providing history today. Patient able to get to EOB with min guard and extended time/assist for line and lead management, however then required MinA for sit to stand and to correct posterior LOB once standing. Tolerated gait training approximately 17f with RW and Min-ModA but very easily fatigued, unsafe use of RW and would be a very high fall risk if she were attempting to ambulate independently. MD present and observed part of mobility with patient. She was able to return to bed with light MinA for LE management, and was positioned to comfort with all needs met this morning and family present, MD attending. She will continue to benefit from skilled PT services in the acute setting, however do recommend ST-SNF to address mobility deficits and due to very high fall risk moving forward. VSS on room air during session.     Follow Up Recommendations SNF;Supervision/Assistance - 24 hour(if patient/family refuse SNF, she will require HHPT and 24/7 physical assist/S)    Equipment Recommendations  Rolling walker with 5" wheels;3in1 (PT)    Recommendations for Other Services       Precautions / Restrictions Precautions Precautions: Fall;ICD/Pacemaker Restrictions Weight Bearing Restrictions: No      Mobility  Bed Mobility Overal bed mobility: Needs Assistance Bed Mobility: Supine to Sit;Sit to Supine     Supine to sit: Min guard Sit to supine: Min assist   General bed mobility comments: min guard for  safety and line management OOB, required light MinA for LEs for back to bed  Transfers Overall transfer level: Needs assistance Equipment used: Rolling walker (2 wheeled) Transfers: Sit to/from Stand Sit to Stand: Min assist         General transfer comment: MinA to boost to full standing, posterior LOB noted which required MinA to correct  Ambulation/Gait Ambulation/Gait assistance: Mod assist Gait Distance (Feet): 25 Feet Assistive device: Rolling walker (2 wheeled) Gait Pattern/deviations: Step-through pattern;Decreased step length - right;Decreased step length - left;Decreased dorsiflexion - right;Decreased dorsiflexion - left;Decreased stride length;Drifts right/left;Trunk flexed Gait velocity: decreased   General Gait Details: unsafe use of RW pushing it far ahead of her, [place] management but only required MinA for balance in general. Easily fatigued  Stairs            Wheelchair Mobility    Modified Rankin (Stroke Patients Only)       Balance Overall balance assessment: Needs assistance;History of Falls Sitting-balance support: Bilateral upper extremity supported;Feet supported Sitting balance-Leahy Scale: Fair     Standing balance support: Bilateral upper extremity supported;During functional activity Standing balance-Leahy Scale: Poor Standing balance comment: reliant on external support, MinA to maintain balance with RW                             Pertinent Vitals/Pain Pain Assessment: Faces Pain Score: 0-No pain Faces Pain Scale: No hurt Pain Intervention(s): Limited activity within patient's tolerance;Monitored during session    Home Living Family/patient expects to be discharged to:: Private residence Living Arrangements: Alone Available  Help at Discharge: Family;Available 24 hours/day Type of Home: House Home Access: Stairs to enter Entrance Stairs-Rails: None Entrance Stairs-Number of Steps: 1 Home Layout: One level Home  Equipment: Walker - 2 wheels;Walker - 4 wheels;Cane - single point;Bedside commode;Shower seat;Toilet riser Additional Comments: usually lives alone, grand-daughter is with her now 24/7 as she is able to work from home during the pandemic but family usually cannot be with her    Prior Function Level of Independence: Independent with assistive device(s)         Comments: has rollator, has been getting weaker per grand-daughter and has good days where she can walk 10 minutes plus, other days she has a hard time even getting around the house     Hand Dominance   Dominant Hand: Right    Extremity/Trunk Assessment   Upper Extremity Assessment Upper Extremity Assessment: Generalized weakness    Lower Extremity Assessment Lower Extremity Assessment: Generalized weakness    Cervical / Trunk Assessment Cervical / Trunk Assessment: Kyphotic  Communication   Communication: HOH  Cognition Arousal/Alertness: Awake/alert Behavior During Therapy: Flat affect Overall Cognitive Status: Within Functional Limits for tasks assessed                                        General Comments      Exercises     Assessment/Plan    PT Assessment Patient needs continued PT services  PT Problem List Decreased strength;Decreased mobility;Decreased safety awareness;Decreased coordination;Decreased activity tolerance;Decreased balance;Decreased knowledge of use of DME       PT Treatment Interventions DME instruction;Therapeutic activities;Gait training;Therapeutic exercise;Patient/family education;Stair training;Balance training;Functional mobility training;Neuromuscular re-education    PT Goals (Current goals can be found in the Care Plan section)  Acute Rehab PT Goals PT Goal Formulation: Patient unable to participate in goal setting Time For Goal Achievement: 05/29/19 Potential to Achieve Goals: Fair    Frequency Min 2X/week   Barriers to discharge Decreased caregiver  support grand-daughter can stay with her 24/7 now due to working from home due to pandemic, usually patient lives alone    Co-evaluation               AM-PAC PT "6 Clicks" Mobility  Outcome Measure Help needed turning from your back to your side while in a flat bed without using bedrails?: A Little Help needed moving from lying on your back to sitting on the side of a flat bed without using bedrails?: A Little Help needed moving to and from a bed to a chair (including a wheelchair)?: A Lot Help needed standing up from a chair using your arms (e.g., wheelchair or bedside chair)?: A Lot Help needed to walk in hospital room?: A Lot Help needed climbing 3-5 steps with a railing? : A Lot 6 Click Score: 14    End of Session Equipment Utilized During Treatment: Gait belt Activity Tolerance: Patient limited by fatigue Patient left: in bed;with call bell/phone within reach;with bed alarm set;with family/visitor present;Other (comment)(MD present and attending)   PT Visit Diagnosis: Unsteadiness on feet (R26.81);History of falling (Z91.81);Muscle weakness (generalized) (M62.81);Difficulty in walking, not elsewhere classified (R26.2);Other abnormalities of gait and mobility (R26.89)    Time: 2751-7001 PT Time Calculation (min) (ACUTE ONLY): 25 min   Charges:   PT Evaluation $PT Eval Moderate Complexity: 1 Mod PT Treatments $Gait Training: 8-22 mins        Deniece Ree PT, DPT,  CBIS  Supplemental Physical Therapist [place]    Pager [phone] Acute Rehab Office [phone]

## 2019-05-15 NOTE — Progress Notes (Signed)
Progress Note    Kim Mullins  PQZ:[id number] DOB: 02/09/1930  DOA: 05/14/2019 PCP: Lin Landsman, MD    Brief Narrative:     Medical records reviewed and are as summarized below:  Kim Mullins is an 85 y.o. female with medical history significant of hypertension, diet-controlled diabetes, asthma, on 2 to 3 L nasal cannula oxygen at home, stroke, GERD, OSA on CPAP, complete heart block, s/p of pacemaker placement, CHF with EF 35%, CKD-III, who presents with generalized weakness and dark emesis. Patient has generalized weakness, particularly in both legs.  She also has decreased appetite and poor oral intake recently.    Assessment/Plan:   Principal Problem:   Hematemesis Active Problems:   Benign essential HTN   Pacemaker   History of stroke   Atrial fibrillation (HCC)   GERD (gastroesophageal reflux disease)   Chronic systolic CHF (congestive heart failure) (HCC)   Acute renal failure superimposed on stage 3 chronic kidney disease (HCC)   Fall   Hyponatremia   Hyperkalemia   FTT/contsitpation/hypoglycemia -per granddaughter does not eat much -also remains constipated despite bowel regimen -blood sugars low this AM-- will add diet and q2 hour accuchecks Per granddaughter in February palliative care saw patient at home but she never heard back from them or hospice Per Dr. Aundra Dubin in last note: Poor prognosis with advanced HF in setting of nonischemic cardiomyopathy/cardiac amyloidosis.   Reported Hematemesis: per her granddaughter, patient had 5 episodes of coffee-ground emesis.  Her hemoglobin is stable if not volume concentrated - will place in tele bed for obs - hold Eliquis -gentle IVF -protonix -suspect vomiting was from gastroparesis - Zofran IV for nausea - Avoid NSAIDs and SQ heparin -had EDG in 2015  Hyperkalemia: K 5.7.  EKG showed paced rhythm, no T wave peaking. Due to hematemesis, will not give Kayexalate. -resolved  HTN: Blood  pressures are soft -hold home Bumex due to worsening renal function  Atrial Fibrillation: CHA2DS2-VASc Score is 8, needs oral anticoagulation. Patient is on Eliquis at home. Heart rate is well controlled. -hold Eliquis for now  History of stroke: -hold eliquis due to reported hematemesis resume eliquis if no further episodes and Hgb stable  GERD (gastroesophageal reflux disease): -on IV protonix  Chronic systolic CHF (congestive heart failure) ([place]): 2D echo 07/21/2018 showed EF of 35-40%.  Patient does not have leg edema JVD.  Clinically patient is dry. -Hold diuretics (Bumex and metolazone) due to worsening renal function -will get CHF consult as weight trending down and I suspect the edema is was having related to poor PO intake and she is intravascularly dry  Acute renal failure superimposed on stage 3 chronic kidney disease ([place]): Baseline creatinine 1.2-1.3.  Her creatinine is 1.71, BUN 60. Likely due to prerenal secondary to dehydration and continuation of diuretics - IVF as above - daily BMP  Fall: CT-head negative for acute intracranial abnormalities. -PT/OT  Hyponatremia: Na 130. Mental status is at her baseline.  Likely due to poor oral intake, and continuation of diuretics. -cautious IVF     Family Communication/Anticipated D/C date and plan/Code Status   DVT prophylaxis: eliquis on hold. Code Status: Full Code.  Family Communication: granddaughter at bedside Disposition Plan: seems over the last 6 months to have a rapid decline with weight loss and difficult medication management due to poor PO intake.  Think she would benefit from hospice.    Medical Consultants:    Palliative care  CHF team  Subjective:   Hard  of hearing but says this AM she is hungry and would like to eat  Objective:    Vitals:   05/15/19 0330 05/15/19 0458 05/15/19 0500 05/15/19 0501  BP: 94/66 (!) 89/71 93/71   Pulse:      Resp: (!) 21 (!) 9 15   Temp:      TempSrc:       SpO2:    99%    Intake/Output Summary (Last 24 hours) at 05/15/2019 1200 Last data filed at 05/15/2019 0304 Gross per 24 hour  Intake 250 ml  Output -  Net 250 ml   There were no vitals filed for this visit.  Exam: Working with PT-- very unsteady rrr No increased work of breathing Pleasant and cooperative-- appears tired    Data Reviewed:   I have personally reviewed following labs and imaging studies:  Labs: Labs show the following:   Basic Metabolic Panel: Recent Labs  Lab 05/10/19 1335 05/14/19 2112 05/15/19 0516  NA 131* 130* 131*  K 3.1* 5.7* 4.4  CL 88* 98 98  CO2 29 20* 19*  GLUCOSE 149* 112* 85  BUN 71* 60* 55*  CREATININE 1.30* 1.71* 1.61*  CALCIUM 9.3 9.3 8.7*  MG  --  2.8*  --    GFR CrCl cannot be calculated (Unknown ideal weight.). Liver Function Tests: Recent Labs  Lab 05/14/19 2112  AST 46*  ALT 25  ALKPHOS 118  BILITOT 3.9*  PROT 8.2*  ALBUMIN 3.6   Recent Labs  Lab 05/14/19 2112  LIPASE 31   No results for input(s): AMMONIA in the last 168 hours. Coagulation profile Recent Labs  Lab 05/15/19 0516  INR 1.7*    CBC: Recent Labs  Lab 05/14/19 1615 05/15/19 0516  WBC 3.2* 3.3*  NEUTROABS 2.2  --   HGB 15.5* 14.1  HCT 46.8* 43.1  MCV 91.6 91.9  PLT 162 137*   Cardiac Enzymes: Recent Labs  Lab 05/15/19 0516  CKTOTAL 43   BNP (last 3 results) No results for input(s): PROBNP in the last 8760 hours. CBG: Recent Labs  Lab 05/15/19 0606 05/15/19 0825 05/15/19 1003  GLUCAP 77 67* 71   D-Dimer: No results for input(s): DDIMER in the last 72 hours. Hgb A1c: No results for input(s): HGBA1C in the last 72 hours. Lipid Profile: No results for input(s): CHOL, HDL, LDLCALC, TRIG, CHOLHDL, LDLDIRECT in the last 72 hours. Thyroid function studies: No results for input(s): TSH, T4TOTAL, T3FREE, THYROIDAB in the last 72 hours.  Invalid input(s): FREET3 Anemia work up: No results for input(s): VITAMINB12, FOLATE,  FERRITIN, TIBC, IRON, RETICCTPCT in the last 72 hours. Sepsis Labs: Recent Labs  Lab 05/14/19 1615 05/15/19 0516  WBC 3.2* 3.3*    Microbiology Recent Results (from the past 240 hour(s))  SARS CORONAVIRUS 2 (TAT 6-24 HRS) Nasopharyngeal Nasopharyngeal Swab     Status: None   Collection Time: 05/14/19 10:10 PM   Specimen: Nasopharyngeal Swab  Result Value Ref Range Status   SARS Coronavirus 2 NEGATIVE NEGATIVE Final    Comment: (NOTE) SARS-CoV-2 target nucleic acids are NOT DETECTED. The SARS-CoV-2 RNA is generally detectable in upper and lower respiratory specimens during the acute phase of infection. Negative results do not preclude SARS-CoV-2 infection, do not rule out co-infections with other pathogens, and should not be used as the sole basis for treatment or other patient management decisions. Negative results must be combined with clinical observations, patient history, and epidemiological information. The expected result is Negative. Fact Sheet for  Patients: SugarRoll.be Fact Sheet for Healthcare Providers: https://www.woods-mathews.com/ This test is not yet approved or cleared by the [place] FDA and  has been authorized for detection and/or diagnosis of SARS-CoV-2 by FDA under an Emergency Use Authorization (EUA). This EUA will remain  in effect (meaning this test can be used) for the duration of the COVID-19 declaration under Section 56 4(b)(1) of the Act, 21 U.S.C. section 360bbb-3(b)(1), unless the authorization is terminated or revoked sooner. Performed at [facility], [place] [address], [place], [place] [address]     Procedures and diagnostic studies:  Ct Abdomen Pelvis Wo Contrast  Result Date: 05/14/2019 CLINICAL DATA:  Nausea and vomiting EXAM: CT ABDOMEN AND PELVIS WITHOUT CONTRAST TECHNIQUE: Multidetector CT imaging of the abdomen and pelvis was performed following the standard protocol without IV  contrast. COMPARISON:  02/09/2014 FINDINGS: Lower chest: No focal infiltrate or sizable effusion is seen. Heart is enlarged in size. Pacing device is noted. Hepatobiliary: No focal liver abnormality is seen. No gallstones, gallbladder wall thickening, or biliary dilatation. Pancreas: Unremarkable. No pancreatic ductal dilatation or surrounding inflammatory changes. Spleen: Normal in size without focal abnormality. Adrenals/Urinary Tract: Adrenal glands are within normal limits. Large upper pole renal cyst is noted on the right stable from the prior exam. No obstructive changes are seen. Atrophy of the lower pole is noted new from the prior exam likely related to prior infarct. Right kidney demonstrates no renal calculi or obstructive changes. The bladder is well distended. Stomach/Bowel: Scattered diverticular changes noted without evidence of diverticulitis. The appendix is not well visualized. No inflammatory changes to suggest appendicitis are noted. No gastric or small bowel abnormality is noted. Vascular/Lymphatic: Aortic atherosclerosis. No enlarged abdominal or pelvic lymph nodes. Reproductive: Status post hysterectomy. No adnexal masses. Other: No hernia is identified.  Mild changes of anasarca are noted. Musculoskeletal: Right hip replacement is noted. Degenerative changes of lumbar spine are seen. IMPRESSION: Lower pole renal atrophy on the left consistent with prior infarct. Mild changes of anasarca. Diverticulosis without diverticulitis. Electronically Signed   By: Inez Catalina M.D.   On: 05/14/2019 22:56   Ct Head Wo Contrast  Result Date: 05/14/2019 CLINICAL DATA:  Headache emesis EXAM: CT HEAD WITHOUT CONTRAST TECHNIQUE: Contiguous axial images were obtained from the base of the skull through the vertex without intravenous contrast. COMPARISON:  CT brain 01/07/2017 FINDINGS: Brain: No acute territorial infarction, hemorrhage or intracranial mass. Atrophy and mild small vessel ischemic changes of  the white matter. Probable chronic lacunar infarcts in the basal ganglia. Stable ventricle size Vascular: No hyperdense vessels.  Carotid vascular calcification Skull: Normal. Negative for fracture or focal lesion. Sinuses/Orbits: Mucosal thickening in the ethmoid sinuses Other: None IMPRESSION: 1. No CT evidence for acute intracranial abnormality. 2. Atrophy and small vessel ischemic changes of the white matter Electronically Signed   By: Donavan Foil M.D.   On: 05/14/2019 22:51   Dg Chest Portable 1 View  Result Date: 05/14/2019 CLINICAL DATA:  Shortness of breath EXAM: PORTABLE CHEST 1 VIEW COMPARISON:  08/17/2018 FINDINGS: Left-sided multi lead pacing device. Cardiomegaly with aortic atherosclerosis. No consolidation or effusion. No focal airspace disease. IMPRESSION: No active disease.  Stable cardiomegaly. Electronically Signed   By: Donavan Foil M.D.   On: 05/14/2019 17:00    Medications:   . dextrose  50 mL Intravenous Once  . latanoprost  1 drop Both Eyes QHS  . loratadine  10 mg Oral Daily  . [START ON 05/18/2019] pantoprazole  40 mg  Intravenous Q12H  . senna-docusate  1 tablet Oral BID  . timolol  1 drop Both Eyes Daily   Continuous Infusions: . sodium chloride    . pantoprozole (PROTONIX) infusion 8 mg/hr (05/15/19 0416)     LOS: 0 days   Geradine Girt  [facility]   How to contact the [facility] Attending or Consulting provider [place] or covering provider during after hours [place], for this patient?  1. Check the care team in [facility] and look for a) attending/consulting [facility] provider listed and b) the [facility] team listed 2. Log into [email or web address] and use [place]'s universal password to access. If you do not have the password, please contact the hospital operator. 3. Locate the [facility] provider you are looking for under [facility] and page to a number that you can be directly reached. 4. If you still have difficulty reaching the provider, please page the [facility] (Director on  Call) for the Hospitalists listed on amion for assistance.  05/15/2019, 12:00 PM

## 2019-05-15 NOTE — ED Notes (Signed)
Dinner tray ordered.

## 2019-05-15 NOTE — ED Notes (Signed)
Pt ambulated to restroom with assistance.  

## 2019-05-15 NOTE — ED Notes (Signed)
Called granddaughter and notified her of room placement

## 2019-05-15 NOTE — Consult Note (Addendum)
Advanced Heart Failure Team Consult Note   Primary Physician: Lin Landsman, MD PCP-Cardiologist:  No primary care provider on file.  Reason for Consultation: Heart Failure   HPI:    Kim Mullins is seen today for evaluation of heart failure  at the request of Dr Eliseo Squires.   Kim Mullins is a 85 y.o. female with a history of chronic systolic CHF/nonischemic cardiomyopathy, prior complete heart block, OSA, and paroxysmal atrial fibrillation who presents for followup of CHF. She was diagnosed with a cardiomyopathy back in 2015. She had right and left heart cath in 12/15, showing no significant coronary disease. EF was in the 30-35% range. She was admitted in 2/16 with symptomatic bradycardia/complete heart block and had Medtronic CRT-P placed. In 4/16, EF was back up to 50-55%. However, repeat echo in 6/17 showed EF down to 15-20% with RV dysfunction.   She was admitted in 8/17 with suspected TIA =>dysarthria and blurred vision. She had been on Eliquis 2.5 mg bid, this was increased to 5 mg bid. Lisinopril was stopped and Coreg was decreased due to soft BP. Echo was done that admission, showing improvement in EF to 40-45%.  Echo in 5/19 showed EF 40%, moderate LVH, moderate diastolic dysfunction, mild to moderately decreased RV systolic function, dilated IVC.   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   Admitted 11/19 - 07/26/18 with worsening renal failure with Cr up to 3.7 from baseline 1.5 - 1.7. RHC demonstrated R>L failure as below. Transiently on milrinone, but weaned off prior to discharge. Cr remained elevated but stable at 2.7 on day of discharge. Echo in 11/19 showed EF 35-40%, severe TR, mildly dilated RV.   Over the last few days she has had functional decline. Complaining of fatigue, constipation, and nausea. Poor appetite. Only eating a few bites. Weight has been trending down. Weight has gone down 20 pounds since July.  Over the  last week she had black BM. Over the weekend she had N/V with coffee ground emesis x5.  Of note her niece is visiting for a few months but she lives alone and requires assistance with ADLs.  Yesterday she presented to [facility] ED after having ongoing episodes of coffee ground emesis. Last dose of eliquis was 9/14.CT of abdomen was negative for acute findings.  CXR was negative. Pertinent admission labs included: BNP 1217, Creatinine 1.7, K 5.7, WBC 3.3, hgb 14. Placed IV fluids and IV protonix. Hyperkalemia treated by primary team.    Remains SOB with exertion.   Echo (11/19): EF 35-40%, moderate LVH, mild MR, severe TR, mildly dilated RV.  Review of Systems: [y] = yes, [ ]  = no   . General: Weight gain [ ] ; Weight loss [ Y]; Anorexia [ ] ; Fatigue [Y ]; Fever [ ] ; Chills [ ] ; Weakness [ Y]  . Cardiac: Chest pain/pressure [ ] ; Resting SOB [ Y]; Exertional SOB [Y ]; Orthopnea [ ] ; Pedal Edema [ ] ; Palpitations [ ] ; Syncope [ ] ; Presyncope [ ] ; Paroxysmal nocturnal dyspnea[ ]   . Pulmonary: Cough [ ] ; Wheezing[ ] ; Hemoptysis[ ] ; Sputum [ ] ; Snoring [ ]   . GI: Vomiting[ ] ; Dysphagia[ ] ; Melena[ ] ; Hematochezia [ ] ; Heartburn[ ] ; Abdominal pain [ ] ; Constipation [ Y]; Diarrhea [ ] ; BRBPR [ ]   . GU: Hematuria[ ] ; Dysuria [ ] ; Nocturia[ ]   . Vascular: Pain in legs with walking [ ] ; Pain in feet with lying flat [ ] ; Non-healing sores [ ] ; Stroke [ ] ;  TIA [ ] ; Slurred speech [ ] ;  . Neuro: Headaches[ ] ; Vertigo[ ] ; Seizures[ ] ; Paresthesias[ ] ;Blurred vision [ ] ; Diplopia [ ] ; Vision changes [ ]   . Ortho/Skin: Arthritis [ ] ; Joint pain [Y ]; Muscle pain [ ] ; Joint swelling [ ] ; Back Pain [Y ]; Rash [ ]   . Psych: Depression[ ] ; Anxiety[ ]   . Heme: Bleeding problems [ ] ; Clotting disorders [ ] ; Anemia [ ]   . Endocrine: Diabetes [ ] ; Thyroid dysfunction[ ]   Home Medications Prior to Admission medications   Medication Sig Start Date End Date Taking? Authorizing Provider  Acetaminophen (TYLENOL) 325 MG CAPS Take  650 mg by mouth daily as needed (for shoulder pain).    Yes [provider]  apixaban (ELIQUIS) 2.5 MG TABS tablet Take 1 tablet (2.5 mg total) by mouth 2 (two) times daily. 11/21/18  Yes Larey Dresser, MD  bumetanide (BUMEX) 1 MG tablet Take 1 tablet (1 mg total) by mouth 2 (two) times daily. Take with 2mg  bumex to equal 3mg  twice a day Patient taking differently: Take 3 mg by mouth 2 (two) times daily.  05/10/19  Yes Larey Dresser, MD  diclofenac sodium (VOLTAREN) 1 % GEL Apply 2 g topically 3 (three) times daily as needed (pain, inflammation). 04/16/16  Yes Emokpae, Courage, MD  fexofenadine (ALLEGRA) 180 MG tablet Take 180 mg by mouth daily. 03/12/19  Yes [provider]  latanoprost (XALATAN) 0.005 % ophthalmic solution Place 1 drop into both eyes at bedtime.  10/20/16  Yes [provider]  metolazone (ZAROXOLYN) 2.5 MG tablet Take one tablet by mouth every Tuesday Patient taking differently: Take 2.5 mg by mouth once a week. every Tuesday Take extra  20 meq of potassium at bedtime on Tuesday 04/30/19  Yes Larey Dresser, MD  OXYGEN Inhale 2 L into the lungs continuous.    Yes [provider]  potassium chloride SA (K-DUR) 20 MEQ tablet Take 40 mEq by mouth See admin instructions. Take twice a day every day On Tuesday take extra 20 meq  at bedtime when take Metolazone 05/08/19  Yes [provider]  timolol (TIMOPTIC) 0.5 % ophthalmic solution Place 1 drop into both eyes at bedtime.  07/29/15  Yes [provider]  glucose blood test strip Use to test blood sugar twice daily ([employer] Smartview) 07/19/16   Larey Dresser, MD    Past Medical History: Past Medical History:  Diagnosis Date  . Anemia    occassionally  . Arthritis    all over.  . Asthma    Allergixc reaction to cats.  . Atrial fibrillation ([place])   . Bowel obstruction ([place])   . Chronic systolic CHF (congestive heart failure) (HCC)    EF 30-35% by cath, echo 2016 EF 50%   . Complete heart block (HCC)    a. s/p MDT CRTP pacemaker  . DCM (dilated cardiomyopathy) ([place]) 08/16/2014   normal coronary arteries on cath with EF 30-45%.  EF now 50% by echo 11/2014  . Diabetes mellitus 2006   Diet and exercise controlled.  Marland Kitchen Dyspnea   . GERD (gastroesophageal reflux disease)    occ  . Glaucoma 08/17/2018  . Hypertension   . Left tibial fracture 2007  . OSA (obstructive sleep apnea) 10/23/2014   Moderate with AHI 21/hr  . Osteoarthritis of right shoulder region 06/26/2013  . Stroke [facility])     Past Surgical History: Past Surgical History:  Procedure Laterality Date  . ABDOMINAL HYSTERECTOMY  1969  .  BI-VENTRICULAR PACEMAKER INSERTION N/A 10/07/2014   MDT CRTP implanted by Dr Lovena Le  . BRAVO [place] STUDY N/A 06/11/2014   Procedure: BRAVO [place] STUDY;  Surgeon: Inda Castle, MD;  Location: [facility] ENDOSCOPY;  Service: Endoscopy;  Laterality: N/A;  . BUNIONECTOMY  1984   Bilateral  . CARDIAC CATHETERIZATION     normal coronary arteries  . Carpal Tunnell  2004   Bilateral  . CATARACT EXTRACTION Right    early 2015  . corn removal  1999   Bilateral feet  . DILATION AND CURETTAGE OF UTERUS  1968  . ESOPHAGOGASTRODUODENOSCOPY (EGD) WITH PROPOFOL N/A 06/11/2014   Procedure: ESOPHAGOGASTRODUODENOSCOPY (EGD) WITH PROPOFOL;  Surgeon: Inda Castle, MD;  Location: [facility] ENDOSCOPY;  Service: Endoscopy;  Laterality: N/A;  . [place]   Surgery to fix Retainal detachment, bilateral  . JOINT REPLACEMENT    . LEFT AND RIGHT HEART CATHETERIZATION WITH CORONARY ANGIOGRAM N/A 08/29/2014   Procedure: LEFT AND RIGHT HEART CATHETERIZATION WITH CORONARY ANGIOGRAM;  Surgeon: Peter M Martinique, MD;  Location: [facility] CATH LAB;  Service: Cardiovascular;  Laterality: N/A;  . MALONEY DILATION  06/11/2014   Procedure: Venia Minks DILATION;  Surgeon: Inda Castle, MD;  Location: [facility] ENDOSCOPY;  Service: Endoscopy;;  . PILONIDAL CYST EXCISION  1959  . RIGHT HEART CATH N/A 07/24/2018   Procedure:  RIGHT HEART CATH;  Surgeon: Larey Dresser, MD;  Location: [place] CV LAB;  Service: Cardiovascular;  Laterality: N/A;  . TONSILLECTOMY  1942  . TOTAL HIP ARTHROPLASTY  11/16/2011   Procedure: TOTAL HIP ARTHROPLASTY ANTERIOR APPROACH;  Surgeon: Hessie Dibble, MD;  Location: [place];  Service: Orthopedics;  Laterality: Right;  DEPUY  . TOTAL SHOULDER ARTHROPLASTY Right 06/26/2013   Procedure: TOTAL SHOULDER ARTHROPLASTY;  Surgeon: Johnny Bridge, MD;  Location: [place];  Service: Orthopedics;  Laterality: Right;    Family History: Family History  Problem Relation Age of Onset  . Dementia Mother   . Coronary artery disease Mother   . Cancer Father        blood ? type  . Diabetes Maternal Grandfather   . Anuerysm Daughter        brain  . Colon cancer Neg Hx     Social History: Social History   Socioeconomic History  . Marital status: Widowed    Spouse name: Not on file  . Number of children: 0  . Years of education: Not on file  . Highest education level: Doctorate  Occupational History  . Occupation: retired  Scientific laboratory technician  . Financial resource strain: Not hard at all  . Food insecurity    Worry: Never true    Inability: Never true  . Transportation needs    Medical: No    Non-medical: No  Tobacco Use  . Smoking status: Former Smoker    Packs/day: 1.00    Years: 45.00    Pack years: 45.00    Types: Cigarettes    Quit date: 07/24/1978    Years since quitting: 40.8  . Smokeless tobacco: Never Used  Substance and Sexual Activity  . Alcohol use: Yes    Alcohol/week: 7.0 standard drinks    Types: 7 Glasses of wine per week    Comment: every day  . Drug use: No  . Sexual activity: Not Currently  Lifestyle  . Physical activity    Days per week: Not on file    Minutes per session: Not on file  . Stress: Not on file  Relationships  . Social Herbalist on phone: Not on file    Gets together: Not on file    Attends religious service: Not on file     Active member of club or organization: Not on file    Attends meetings of clubs or organizations: Not on file    Relationship status: Not on file  Other Topics Concern  . Not on file  Social History Narrative   Lives alone    Allergies:  Allergies  Allergen Reactions  . Hydrocodone Other (See Comments)    Took away all energy and made her stop eating food for short time  . Percocet [Oxycodone-Acetaminophen] Other (See Comments)    Too away all energy and made her stop eating food for short time  . Eggs Or Egg-Derived Products Hives and Rash  . Mercurial Derivatives Itching and Rash  . Penicillins Hives and Rash    Has patient had a PCN reaction causing immediate rash, facial/tongue/throat swelling, SOB or lightheadedness with hypotension: Yes Has patient had a PCN reaction causing severe rash involving mucus membranes or skin necrosis: No Has patient had a PCN reaction that required hospitalization: No Has patient had a PCN reaction occurring within the last 10 years: Yes If all of the above answers are "NO", then may proceed with Cephalosporin use.  . Quinine Derivatives Hives and Rash    Objective:    Vital Signs:   Temp:  [97.3 F (36.3 C)-98.2 F (36.8 C)] 97.7 F (36.5 C) (09/15 0317) Pulse Rate:  [62-68] 68 (09/15 0317) Resp:  [9-27] 15 (09/15 0500) BP: (89-110)/(60-85) 93/71 (09/15 0500) SpO2:  [96 %-99 %] 99 % (09/15 0501) Weight:  [52.2 kg] 52.2 kg (09/14 1540)    Weight change: There were no vitals filed for this visit.  Intake/Output:   Intake/Output Summary (Last 24 hours) at 05/15/2019 1227 Last data filed at 05/15/2019 0304 Gross per 24 hour  Intake 250 ml  Output -  Net 250 ml      Physical Exam    General: Thin . No resp difficulty HEENT: normal Neck: supple. JVP 9-10 . Carotids 2+ bilat; no bruits. No lymphadenopathy or thyromegaly appreciated. Cor: PMI nondisplaced. Regular rate & rhythm. No rubs, gallops or murmurs. Lungs: clear Abdomen:  soft, nontender, nondistended. No hepatosplenomegaly. No bruits or masses. Good bowel sounds. Extremities: no cyanosis, clubbing, rash, edema Neuro: alert & orientedx3, cranial nerves grossly intact. moves all 4 extremities w/o difficulty. Affect pleasant   Telemetry  Paced 65 bpm    EKG      Labs   Basic Metabolic Panel: Recent Labs  Lab 05/10/19 1335 05/14/19 2112 05/15/19 0516  NA 131* 130* 131*  K 3.1* 5.7* 4.4  CL 88* 98 98  CO2 29 20* 19*  GLUCOSE 149* 112* 85  BUN 71* 60* 55*  CREATININE 1.30* 1.71* 1.61*  CALCIUM 9.3 9.3 8.7*  MG  --  2.8*  --     Liver Function Tests: Recent Labs  Lab 05/14/19 2112  AST 46*  ALT 25  ALKPHOS 118  BILITOT 3.9*  PROT 8.2*  ALBUMIN 3.6   Recent Labs  Lab 05/14/19 2112  LIPASE 31   No results for input(s): AMMONIA in the last 168 hours.  CBC: Recent Labs  Lab 05/14/19 1615 05/15/19 0516  WBC 3.2* 3.3*  NEUTROABS 2.2  --   HGB 15.5* 14.1  HCT 46.8* 43.1  MCV 91.6 91.9  PLT 162 137*  Cardiac Enzymes: Recent Labs  Lab 05/15/19 0516  CKTOTAL 43    BNP: BNP (last 3 results) Recent Labs    08/17/18 1332 10/09/18 1238 05/15/19 0516  BNP 2,006.0* 1,424.7* 1,217.6*    ProBNP (last 3 results) No results for input(s): PROBNP in the last 8760 hours.   CBG: Recent Labs  Lab 05/15/19 0606 05/15/19 0825 05/15/19 1003 05/15/19 1223  GLUCAP 77 67* 71 69*    Coagulation Studies: Recent Labs    05/15/19 0516  LABPROT 19.7*  INR 1.7*     Imaging   Ct Abdomen Pelvis Wo Contrast  Result Date: 05/14/2019 CLINICAL DATA:  Nausea and vomiting EXAM: CT ABDOMEN AND PELVIS WITHOUT CONTRAST TECHNIQUE: Multidetector CT imaging of the abdomen and pelvis was performed following the standard protocol without IV contrast. COMPARISON:  02/09/2014 FINDINGS: Lower chest: No focal infiltrate or sizable effusion is seen. Heart is enlarged in size. Pacing device is noted. Hepatobiliary: No focal liver  abnormality is seen. No gallstones, gallbladder wall thickening, or biliary dilatation. Pancreas: Unremarkable. No pancreatic ductal dilatation or surrounding inflammatory changes. Spleen: Normal in size without focal abnormality. Adrenals/Urinary Tract: Adrenal glands are within normal limits. Large upper pole renal cyst is noted on the right stable from the prior exam. No obstructive changes are seen. Atrophy of the lower pole is noted new from the prior exam likely related to prior infarct. Right kidney demonstrates no renal calculi or obstructive changes. The bladder is well distended. Stomach/Bowel: Scattered diverticular changes noted without evidence of diverticulitis. The appendix is not well visualized. No inflammatory changes to suggest appendicitis are noted. No gastric or small bowel abnormality is noted. Vascular/Lymphatic: Aortic atherosclerosis. No enlarged abdominal or pelvic lymph nodes. Reproductive: Status post hysterectomy. No adnexal masses. Other: No hernia is identified.  Mild changes of anasarca are noted. Musculoskeletal: Right hip replacement is noted. Degenerative changes of lumbar spine are seen. IMPRESSION: Lower pole renal atrophy on the left consistent with prior infarct. Mild changes of anasarca. Diverticulosis without diverticulitis. Electronically Signed   By: Inez Catalina M.D.   On: 05/14/2019 22:56   Ct Head Wo Contrast  Result Date: 05/14/2019 CLINICAL DATA:  Headache emesis EXAM: CT HEAD WITHOUT CONTRAST TECHNIQUE: Contiguous axial images were obtained from the base of the skull through the vertex without intravenous contrast. COMPARISON:  CT brain 01/07/2017 FINDINGS: Brain: No acute territorial infarction, hemorrhage or intracranial mass. Atrophy and mild small vessel ischemic changes of the white matter. Probable chronic lacunar infarcts in the basal ganglia. Stable ventricle size Vascular: No hyperdense vessels.  Carotid vascular calcification Skull: Normal. Negative for  fracture or focal lesion. Sinuses/Orbits: Mucosal thickening in the ethmoid sinuses Other: None IMPRESSION: 1. No CT evidence for acute intracranial abnormality. 2. Atrophy and small vessel ischemic changes of the white matter Electronically Signed   By: Donavan Foil M.D.   On: 05/14/2019 22:51   Dg Chest Portable 1 View  Result Date: 05/14/2019 CLINICAL DATA:  Shortness of breath EXAM: PORTABLE CHEST 1 VIEW COMPARISON:  08/17/2018 FINDINGS: Left-sided multi lead pacing device. Cardiomegaly with aortic atherosclerosis. No consolidation or effusion. No focal airspace disease. IMPRESSION: No active disease.  Stable cardiomegaly. Electronically Signed   By: Donavan Foil M.D.   On: 05/14/2019 17:00      Medications:     Current Medications: . dextrose  50 mL Intravenous Once  . latanoprost  1 drop Both Eyes QHS  . loratadine  10 mg Oral Daily  . [START ON 05/18/2019] pantoprazole  40 mg Intravenous Q12H  . senna-docusate  1 tablet Oral BID  . timolol  1 drop Both Eyes Daily     Infusions: . sodium chloride    . pantoprozole (PROTONIX) infusion 8 mg/hr (05/15/19 0416)        Assessment/Plan   1. Hematemesis Eliquis on hold. Hgb stable.  PPI ordered 2. Hyperkalemia Treated by primary team.  3. Failure to Thrive secondary to End Stage HF 4. . Chronic systolic CHF: Nonischemic cardiomyopathy.  MDT CRT-P device.  ECHO 07/21/2018 with EF 35-40%, severe TR.  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  Patient has had progressive worsening of dyspnea and volume overload. This is complicated by CKD stage IV.  Restart bumex tonight. Hold metolazone.   No ACEI/ARB/spironolactone/ARNI/beta blocker with CKD stage IV and marginal BP.  5. 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  I think that with advanced age and advanced HF, she is unlikely to benefit significantly from tafamidis. 6. Atrial fibrillation: Paroxysmal.  She is in NSR today. -Hold  Eliquisfor now.  7. CKD Stage IV: Creatinine baseline 1.5 - 2.   Creatinine ok.  8. CHB: Medtronic CRT-P.   9.. Chronic Hypoxic Respiratory Failure  Consult Palliative Care needs goals of care.   Length of Stay: 0  Darrick Grinder, NP  05/15/2019, 12:27 PM  Advanced Heart Failure Team Pager [phone] (M-F; 7a - 4p)  Please contact [facility] for night-coverage after hours (4p -7a ) and weekends on [email or web address]  Patient seen with NP, agree with the above note.   She has had progressive fatigue and weakness at home.  This weekend, she developed hematemesis and had dark stool.  This has resolved.  She contacted the clinic and was told to go to the ER to be evaluated.  Labs showed elevated K at 5.7, creatinine not far off baseline at 1.71. FOBT negative.  She was admitted for failure to thrive.   ECG: NSR with BiV pacing.   General: NAD, frail Neck: JVP 9-10 cm, no thyromegaly or thyroid nodule.  Lungs: Distant BS CV: Nondisplaced PMI.  Heart regular S1/S2, no S3/S4, no murmur.  No peripheral edema.   Abdomen: Soft, nontender, no hepatosplenomegaly, no distention.  Skin: Intact without lesions or rashes.  Neurologic: Alert and oriented x 3.  Psych: Normal affect. Extremities: No clubbing or cyanosis.  HEENT: Normal.   1. Melena/hematemesis: Over the weekend.  Today, stable hgb and FOBT negative.   - Hold Eliquis for now and monitor, likely can restart if no further overt GI bleeding.  2. Failure to thrive: Poor appetite and losing weight.  Markedly fatigue.  Poor prognosis with advanced HF in setting of nonischemic cardiomyopathy and cardiac amyloidosis.  We have started discussions about palliative care, unfortunately she had a bad experience with palliative care at home earlier this year.  - Consult palliative care as an inpatient, I do think that hospice would be appropriate in this situation.  3. Chronic systolic CHF: Nonischemic cardiomyopathy.  MDT CRT-P device.  ECHO 07/21/2018  with EF 35-40%, severe TR.  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  Patient has had progressive worsening of dyspnea and volume overload. This is complicated by CKD stage IV. At last visit, I increased diuresis by adding scheduled metolazone.  She still is profoundly fatigued.  BUN/creatinine only mildly elevated above baseline though initial K was high (treated).  On exam, I do not think she is dry (still likely mildly  volume overloaded).     - Hold initial dose of bumetanide (did not take last night either) and can restart this evening at 4 mg bid.   - Wear compression stockings daily.  -No ACEI/ARB/spironolactone/ARNI/beta blocker with CKD stage IV and marginal BP.  4. 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  I think that with advanced age and advanced HF, she is unlikely to benefit significantly from tafamidis. 5. Atrial fibrillation: Paroxysmal.  She is in NSR today. -Eliquis2.5 g BID given her age and elevated creatinine => held for now with ?GI bleeding (see above).  6. CKD Stage IV: Creatinine baseline 1.5 - 2. Follow closely.  7. CHB: Medtronic CRT-P.   8. Chronic Hypoxic Respiratory Failure: Continue  2 liter oxygen continuously.   [name] 05/15/2019

## 2019-05-15 NOTE — ED Notes (Addendum)
CBG 67 nurse notified

## 2019-05-16 ENCOUNTER — Other Ambulatory Visit: Payer: Self-pay

## 2019-05-16 ENCOUNTER — Encounter (HOSPITAL_COMMUNITY): Payer: Self-pay

## 2019-05-16 DIAGNOSIS — R627 Adult failure to thrive: Secondary | ICD-10-CM

## 2019-05-16 LAB — CBC
HCT: 42 % (ref 36.0–46.0)
Hemoglobin: 13.9 g/dL (ref 12.0–15.0)
MCH: 30.3 pg (ref 26.0–34.0)
MCHC: 33.1 g/dL (ref 30.0–36.0)
MCV: 91.5 fL (ref 80.0–100.0)
Platelets: 141 10*3/uL — ABNORMAL LOW (ref 150–400)
RBC: 4.59 MIL/uL (ref 3.87–5.11)
RDW: 17.5 % — ABNORMAL HIGH (ref 11.5–15.5)
WBC: 3.5 10*3/uL — ABNORMAL LOW (ref 4.0–10.5)
nRBC: 0 % (ref 0.0–0.2)

## 2019-05-16 LAB — BASIC METABOLIC PANEL
Anion gap: 11 (ref 5–15)
BUN: 49 mg/dL — ABNORMAL HIGH (ref 8–23)
CO2: 20 mmol/L — ABNORMAL LOW (ref 22–32)
Calcium: 8.8 mg/dL — ABNORMAL LOW (ref 8.9–10.3)
Chloride: 99 mmol/L (ref 98–111)
Creatinine, Ser: 1.59 mg/dL — ABNORMAL HIGH (ref 0.44–1.00)
GFR calc Af Amer: 34 mL/min — ABNORMAL LOW (ref >60)
GFR calc non Af Amer: 29 mL/min — ABNORMAL LOW (ref >60)
Glucose, Bld: 146 mg/dL — ABNORMAL HIGH (ref 70–99)
Potassium: 4.5 mmol/L (ref 3.5–5.1)
Sodium: 130 mmol/L — ABNORMAL LOW (ref 135–145)

## 2019-05-16 LAB — GLUCOSE, CAPILLARY
Glucose-Capillary: 92 mg/dL (ref 70–99)
Glucose-Capillary: 122 mg/dL — ABNORMAL HIGH (ref 70–99)
Glucose-Capillary: 102 mg/dL — ABNORMAL HIGH (ref 70–99)
Glucose-Capillary: 140 mg/dL — ABNORMAL HIGH (ref 70–99)

## 2019-05-16 MED ORDER — APIXABAN 2.5 MG PO TABS
2.5000 mg | ORAL_TABLET | Freq: Two times a day (BID) | ORAL | Status: DC
  Administered 2019-05-16 – 2019-05-25 (×19): 2.5 mg via ORAL
  Filled 2019-05-16 (×19): qty 1

## 2019-05-16 NOTE — NC FL2 (Signed)
[place] LEVEL OF CARE SCREENING TOOL     IDENTIFICATION  Patient Name: Kim Mullins Birthdate: 03/27/1930 Sex: female Admission Date (Current Location): 05/14/2019  [facility] and [place] Number:  Herbalist and Address:  [facility], [place] [address] [place], [place] [address]      Provider Number: [id number]  Attending Physician Name and Address:  Yaakov Guthrie, MD  Relative Name and Phone Number:       Current Level of Care: Hospital Recommended Level of Care: [place] Prior Approval Number:    Date Approved/Denied:   PASRR Number: [id number]  Discharge Plan: SNF    Current Diagnoses: Patient Active Problem List   Diagnosis Date Noted  . FTT (failure to thrive) in adult 05/15/2019  . Hematemesis 05/14/2019  . Chronic systolic CHF (congestive heart failure) ([place]) 05/14/2019  . Acute renal failure superimposed on stage 3 chronic kidney disease ([place]) 05/14/2019  . Fall 05/14/2019  . Hyponatremia 05/14/2019  . Hyperkalemia 05/14/2019  . Obesity (BMI 30-39.9) 10/20/2018  . Acute on chronic heart failure ([place]) 08/19/2018  . Palliative care encounter   . Palliative care by specialist   . Acute respiratory failure with hypoxia ([place]) 08/18/2018  . Diabetes ([place]) 08/18/2018  . CKD (chronic kidney disease) 08/18/2018  . Amyloidosis ([place]) 08/18/2018  . Acute on chronic combined systolic and diastolic heart failure ([place]) 08/17/2018  . GERD (gastroesophageal reflux disease) 08/17/2018  . Elevated troponin 08/17/2018  . Glaucoma 08/17/2018  . Hyperbilirubinemia 08/17/2018  . CHF (congestive heart failure) ([place]) 07/18/2018  . Gait abnormality 05/03/2017  . Hypotension 04/14/2016  . Hypotension due to drugs   . Chronic anticoagulation   . TIA (transient ischemic attack) 04/12/2016  . UTI (lower urinary tract infection) 04/12/2016  . AKI (acute kidney injury) ([place]) 04/12/2016  . Atrial fibrillation  ([place]) 09/12/2015  . Occipital infarction ([place])   . History of stroke 06/18/2015  . Homonymous hemianopsia   . Pacemaker 01/21/2015  . OSA (obstructive sleep apnea) 10/23/2014  . Dizziness 10/21/2014  . Complete heart block ([place]) 10/06/2014  . Benign essential HTN 10/04/2014  . Acute on chronic systolic CHF (congestive heart failure) ([place]) 10/04/2014  . Abnormal cardiovascular stress test 08/27/2014  . Pulmonary HTN ([place]) 08/16/2014  . DCM (dilated cardiomyopathy) ([place]) 08/16/2014  . Cough 07/13/2014  . Esophageal stricture 06/11/2014  . Nonspecific (abnormal) findings on radiological and other examination of gastrointestinal tract 05/29/2014  . Dyspnea 04/29/2014  . Osteoarthritis of right shoulder region 06/26/2013  . DJD (degenerative joint disease) of hip 11/16/2011    Class: Present on Admission    Orientation RESPIRATION BLADDER Height & Weight     Situation, Place, Self, Time  O2(Nasal Cannula 2L) Continent Weight: 120 lb 2.4 oz (54.5 kg) Height:  5\' 3"  (160 cm)  BEHAVIORAL SYMPTOMS/MOOD NEUROLOGICAL BOWEL NUTRITION STATUS      Continent Diet(full liquid diet, diet subject to change)  AMBULATORY STATUS COMMUNICATION OF NEEDS Skin   Limited Assist Verbally Normal                       Personal Care Assistance Level of Assistance  Bathing, Feeding, Dressing Bathing Assistance: Limited assistance Feeding assistance: Independent Dressing Assistance: Limited assistance     Functional Limitations Info  Sight, Hearing, Speech Sight Info: Adequate Hearing Info: Adequate Speech Info: Adequate    SPECIAL CARE FACTORS FREQUENCY  OT (By licensed OT), PT (By licensed PT)  PT Frequency: 5x OT Frequency: 5x            Contractures Contractures Info: Not present    Additional Factors Info  Code Status, Allergies Code Status Info: Full Code Allergies Info: Hydrocodone, Percocet (Oxycodone-acetaminophen), Eggs Or Egg-derived Products, Mercurial Derivatives,  Penicillins, Quinine Derivatives           Current Medications (05/16/2019):  This is the current hospital active medication list Current Facility-Administered Medications  Medication Dose Route Frequency Provider Last Rate Last Dose  . acetaminophen (TYLENOL) tablet 650 mg  650 mg Oral Q6H PRN Ivor Costa, MD       Or  . acetaminophen (TYLENOL) suppository 650 mg  650 mg Rectal Q6H PRN Ivor Costa, MD      . albuterol (PROVENTIL) (2.5 MG/3ML) 0.083% nebulizer solution 2.5 mg  2.5 mg Nebulization Q4H PRN Ivor Costa, MD      . bumetanide (BUMEX) tablet 4 mg  4 mg Oral BID Clegg, Amy D, NP   4 mg at 05/16/19 0857  . dextrose 50 % solution 50 mL  50 mL Intravenous Once Vann, Jessica U, DO      . hydrALAZINE (APRESOLINE) injection 5 mg  5 mg Intravenous Q2H PRN Ivor Costa, MD      . latanoprost (XALATAN) 0.005 % ophthalmic solution 1 drop  1 drop Both Eyes QHS Ivor Costa, MD   1 drop at 05/15/19 2205  . loratadine (CLARITIN) tablet 10 mg  10 mg Oral Daily Ivor Costa, MD   10 mg at 05/16/19 0854  . ondansetron (ZOFRAN) tablet 4 mg  4 mg Oral Q6H PRN Ivor Costa, MD       Or  . ondansetron [facility]) injection 4 mg  4 mg Intravenous Q6H PRN Ivor Costa, MD      . pantoprazole (PROTONIX) injection 40 mg  40 mg Intravenous Q12H Vann, Jessica U, DO   40 mg at 05/16/19 0858  . senna-docusate (Senokot-S) tablet 1 tablet  1 tablet Oral BID Ivor Costa, MD   1 tablet at 05/16/19 0857  . timolol (TIMOPTIC) 0.5 % ophthalmic solution 1 drop  1 drop Both Eyes Daily Ivor Costa, MD   1 drop at 05/16/19 9977     Discharge Medications: Please see discharge summary for a list of discharge medications.  Relevant Imaging Results:  Relevant Lab Results:   Additional Information SSN: [id number]  Eileen Stanford, LCSW

## 2019-05-16 NOTE — Plan of Care (Signed)

## 2019-05-16 NOTE — Progress Notes (Signed)
Wide QRS reported by telemetry at approx 1415. Pt stable at this time. Will continue to monitor.

## 2019-05-16 NOTE — TOC Initial Note (Signed)
Transition of Care [facility]) - Initial/Assessment Note    Patient Details  Name: Kim Mullins MRN: [id number] Date of Birth: Jan 29, 1930  Transition of [facility]) CM/SW Contact:    Eileen Stanford, LCSW Phone Number: 05/16/2019, 2:11 PM  Clinical Narrative:  CSW is working remote. Pt is alert and oriented. CSW called pt's room to discuss d/c plan with pt. Pt's Granddaughter answered and stated that pt would not be able to hear CSW over the phone. Pt's Granddaughter states she would discuss the plan with pt. Pt lives home alone however, pt's Granddaughter is in town staying with pt until December. CSW explained PT's recommendation and pt's Granddaughter was not sure if pt would be agreeable to SNF. CSW to follow up.                 Expected Discharge Plan: [place] Barriers to Discharge: Continued Medical Work up   Patient Goals and CMS Choice        Expected Discharge Plan and Services Expected Discharge Plan: [place]   Discharge Planning Services: NA   Living arrangements for the past 2 months: Single Family Home                                      Prior Living Arrangements/Services Living arrangements for the past 2 months: Single Family Home Lives with:: Self(Grandaughter in town staying with pt until December) Patient language and need for interpreter reviewed:: Yes Do you feel safe going back to the place where you live?: Yes      Need for Family Participation in Patient Care: Yes (Comment) Care giver support system in place?: Yes (comment)   Criminal Activity/Legal Involvement Pertinent to Current Situation/Hospitalization: No - Comment as needed  Activities of Daily Living Home Assistive Devices/Equipment: Dentures (specify type), Eyeglasses(uppers) ADL Screening (condition at time of admission) Patient's cognitive ability adequate to safely complete daily activities?: Yes Is the patient deaf or have difficulty  hearing?: No Does the patient have difficulty seeing, even when wearing glasses/contacts?: No Does the patient have difficulty concentrating, remembering, or making decisions?: No Patient able to express need for assistance with ADLs?: Yes Does the patient have difficulty dressing or bathing?: Yes Independently performs ADLs?: Yes (appropriate for developmental age) Does the patient have difficulty walking or climbing stairs?: Yes Weakness of Legs: Both Weakness of Arms/Hands: None  Permission Sought/Granted Permission sought to share information with : Family Supports    Share Information with NAME: Glendell Docker     Permission granted to share info w Relationship: Grandaughter     Emotional Assessment Appearance:: Appears stated age Attitude/Demeanor/Rapport: Unable to Assess Affect (typically observed): Unable to Assess Orientation: : Oriented to Self, Oriented to Place, Oriented to Situation Alcohol / Substance Use: Not Applicable Psych Involvement: No (comment)  Admission diagnosis:  Hyperkalemia [E87.5] Hypermagnesemia [E83.41] AKI (acute kidney injury) ([place]) [N17.9] Fall, initial encounter [W19.XXXA] Hematemesis, presence of nausea not specified [K92.0] FTT (failure to thrive) in adult [R62.7] Patient Active Problem List   Diagnosis Date Noted  . FTT (failure to thrive) in adult 05/15/2019  . Hematemesis 05/14/2019  . Chronic systolic CHF (congestive heart failure) ([place]) 05/14/2019  . Acute renal failure superimposed on stage 3 chronic kidney disease ([place]) 05/14/2019  . Fall 05/14/2019  . Hyponatremia 05/14/2019  . Hyperkalemia 05/14/2019  . Obesity (BMI 30-39.9) 10/20/2018  . Acute on chronic heart failure ([place])  08/19/2018  . Palliative care encounter   . Palliative care by specialist   . Acute respiratory failure with hypoxia ([place]) 08/18/2018  . Diabetes ([place]) 08/18/2018  . CKD (chronic kidney disease) 08/18/2018  . Amyloidosis ([place]) 08/18/2018  . Acute on chronic  combined systolic and diastolic heart failure ([place]) 08/17/2018  . GERD (gastroesophageal reflux disease) 08/17/2018  . Elevated troponin 08/17/2018  . Glaucoma 08/17/2018  . Hyperbilirubinemia 08/17/2018  . CHF (congestive heart failure) ([place]) 07/18/2018  . Gait abnormality 05/03/2017  . Hypotension 04/14/2016  . Hypotension due to drugs   . Chronic anticoagulation   . TIA (transient ischemic attack) 04/12/2016  . UTI (lower urinary tract infection) 04/12/2016  . AKI (acute kidney injury) ([place]) 04/12/2016  . Atrial fibrillation ([place]) 09/12/2015  . Occipital infarction ([place])   . History of stroke 06/18/2015  . Homonymous hemianopsia   . Pacemaker 01/21/2015  . OSA (obstructive sleep apnea) 10/23/2014  . Dizziness 10/21/2014  . Complete heart block ([place]) 10/06/2014  . Benign essential HTN 10/04/2014  . Acute on chronic systolic CHF (congestive heart failure) ([place]) 10/04/2014  . Abnormal cardiovascular stress test 08/27/2014  . Pulmonary HTN ([place]) 08/16/2014  . DCM (dilated cardiomyopathy) ([place]) 08/16/2014  . Cough 07/13/2014  . Esophageal stricture 06/11/2014  . Nonspecific (abnormal) findings on radiological and other examination of gastrointestinal tract 05/29/2014  . Dyspnea 04/29/2014  . Osteoarthritis of right shoulder region 06/26/2013  . DJD (degenerative joint disease) of hip 11/16/2011    Class: Present on Admission   PCP:  Lin Landsman, MD Pharmacy:   [facility] [place], [place] - [address] [address] [place] [place] [address] Phone: [phone] Fax: [phone]     Social Determinants of Health (SDOH) Interventions    Readmission Risk Interventions No flowsheet data found.

## 2019-05-16 NOTE — Progress Notes (Addendum)
Advanced Heart Failure Rounding Note  PCP-Cardiologist: No primary care provider on file.   Subjective:    Feeling better today. Denies SOB.    Objective:   Weight Range: 54.5 kg Body mass index is 21.28 kg/m.   Vital Signs:   Temp:  [97.6 F (36.4 C)-97.7 F (36.5 C)] 97.6 F (36.4 C) (09/16 0651) Pulse Rate:  [51-80] 61 (09/16 0651) Resp:  [13-26] 13 (09/16 0651) BP: (100-108)/(71-85) 101/85 (09/16 0651) SpO2:  [69 %-100 %] 100 % (09/16 0651) Weight:  [54.5 kg] 54.5 kg (09/16 0441)    Weight change: Filed Weights   05/16/19 0052 05/16/19 0441  Weight: 54.5 kg 54.5 kg    Intake/Output:   Intake/Output Summary (Last 24 hours) at 05/16/2019 0830 Last data filed at 05/16/2019 0400 Gross per 24 hour  Intake 709.72 ml  Output -  Net 709.72 ml      Physical Exam    General:  Elderly thin.  No resp difficulty HEENT: Normal Neck: Supple. JVP 6-7  . Carotids 2+ bilat; no bruits. No lymphadenopathy or thyromegaly appreciated. Cor: PMI nondisplaced. Regular rate & rhythm. No rubs, gallops or murmurs. Lungs: Clear on 2 liters  Abdomen: Soft, nontender, nondistended. No hepatosplenomegaly. No bruits or masses. Good bowel sounds. Extremities: No cyanosis, clubbing, rash, edema Neuro: Alert & orientedx3, cranial nerves grossly intact. moves all 4 extremities w/o difficulty. Affect pleasant   Telemetry   V paced 60s   EKG   n/a   Labs    CBC Recent Labs    05/14/19 1615 05/15/19 0516 05/16/19 0417  WBC 3.2* 3.3* 3.5*  NEUTROABS 2.2  --   --   HGB 15.5* 14.1 13.9  HCT 46.8* 43.1 42.0  MCV 91.6 91.9 91.5  PLT 162 137* 545*   Basic Metabolic Panel Recent Labs    05/14/19 2112 05/15/19 0516 05/16/19 0417  NA 130* 131* 130*  K 5.7* 4.4 4.5  CL 98 98 99  CO2 20* 19* 20*  GLUCOSE 112* 85 146*  BUN 60* 55* 49*  CREATININE 1.71* 1.61* 1.59*  CALCIUM 9.3 8.7* 8.8*  MG 2.8*  --   --    Liver Function Tests Recent Labs    05/14/19 2112  AST  46*  ALT 25  ALKPHOS 118  BILITOT 3.9*  PROT 8.2*  ALBUMIN 3.6   Recent Labs    05/14/19 2112  LIPASE 31   Cardiac Enzymes Recent Labs    05/15/19 0516  CKTOTAL 43    BNP: BNP (last 3 results) Recent Labs    08/17/18 1332 10/09/18 1238 05/15/19 0516  BNP 2,006.0* 1,424.7* 1,217.6*    ProBNP (last 3 results) No results for input(s): PROBNP in the last 8760 hours.   D-Dimer No results for input(s): DDIMER in the last 72 hours. Hemoglobin A1C No results for input(s): HGBA1C in the last 72 hours. Fasting Lipid Panel No results for input(s): CHOL, HDL, LDLCALC, TRIG, CHOLHDL, LDLDIRECT in the last 72 hours. Thyroid Function Tests No results for input(s): TSH, T4TOTAL, T3FREE, THYROIDAB in the last 72 hours.  Invalid input(s): FREET3  Other results:   Imaging     No results found.   Medications:     Scheduled Medications: . bumetanide  4 mg Oral BID  . dextrose  50 mL Intravenous Once  . latanoprost  1 drop Both Eyes QHS  . loratadine  10 mg Oral Daily  . pantoprazole  40 mg Intravenous Q12H  . senna-docusate  1  tablet Oral BID  . timolol  1 drop Both Eyes Daily     Infusions:   PRN Medications:  acetaminophen **OR** acetaminophen, albuterol, hydrALAZINE, ondansetron **OR** ondansetron (ZOFRAN) IV    Assessment/Plan  1. Hematemesis Eliquis on hold. Resolved.  PPI ordered 2. Hyperkalemia Treated by primary team. Resolved 3. Failure to Thrive secondary to End Stage HF 4. Chronic systolic CHF: Nonischemic cardiomyopathy. MDT CRT-P device. ECHO 07/21/2018 with EF 35-40%, severe TR. 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 Patient has had progressive worsening of dyspnea and volume overload. This is complicated by CKD stage IV.  - Volume status improved. Continue  bumex 4 mg twice a day.  Hold metolazone.   No ACEI/ARB/spironolactone/ARNI/beta blocker with CKD stage IV and marginal BP.  5. 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 I think that with advanced age and advanced HF, she is unlikely to benefit significantly from tafamidis. 6. Atrial fibrillation: Paroxysmal. Maintaining NSR.  -can restart Eliquis with stable hgb and no overt bleeding.   7. CKD Stage IV: Creatinine baseline 1.5- 2.  Creatinine 1.6 today . Stable.  8. CHB: [employer] CRT-P.  9.. Chronic Hypoxic Respiratory Failure- continue 2 liters oxygen   PT recommending SNF.   Palliative Care consult pending. Ideally she would benefit from Hospice.   Length of Stay: 1  Amy Clegg, NP  05/16/2019, 8:30 AM  Advanced Heart Failure Team Pager [phone] (M-F; 7a - 4p)  Please contact [facility] for night-coverage after hours (4p -7a ) and weekends on [email or web address]  Patient seen with NP, agree with the above note.   No complaints this morning, just tired.  No hematemesis, nausea/vomiting.  Not short of breath at rest.  Creatinine stable 1.59. BP stable.    On exam, no JVD or edema. Telemetry shows BiV pacing.   I think she is at her baseline.  Suspect failure to thrive due to chronic medical conditions.  - Can continue her home bumetanide.  - Hgb stable and no overt bleeding, can restart her home apixaban.  - PT recommend SNF versus HHPT with 24 hr care.  - Palliative care consult.   [name] 05/16/2019 8:46 AM

## 2019-05-16 NOTE — Progress Notes (Signed)
PROGRESS NOTE    Kim Mullins  GGY:[id number] DOB: 09-22-1929 DOA: 05/14/2019 PCP: Lin Landsman, MD   Brief Narrative:  Kim Mullins is an 85 y.o. female with medical history significant ofhypertension, diet-controlled diabetes, asthma, on 2 to 3 L nasal cannula oxygen at home, stroke, GERD, OSA on CPAP, complete heart block,s/p ofpacemaker placement, CHF with EF 35%, CKD-III, who presented with generalized weaknessand dark emesis. Patient has generalized weakness.She also has decreased appetite and poor oral intake recently.    Assessment & Plan:   Principal Problem:   Hematemesis Active Problems:   Benign essential HTN   Pacemaker   History of stroke   Atrial fibrillation (HCC)   GERD (gastroesophageal reflux disease)   Chronic systolic CHF (congestive heart failure) (HCC)   Acute renal failure superimposed on stage 3 chronic kidney disease (HCC)   Fall   Hyponatremia   Hyperkalemia   FTT (failure to thrive) in adult   FTT/contsitpation/hypoglycemia -per granddaughter does not eat much -also remains constipated despite bowel regimen -blood sugars was low previously.  Now improving.  Continue Accu-Cheks. Per granddaughter in February palliative care saw patient at home but she never heard back from them or hospice Per Dr. Aundra Dubin in last note: Poor prognosis with advanced HF in setting of nonischemic cardiomyopathy/cardiac amyloidosis.   -Consulted palliative care to discuss goals of care.  Reported Hematemesis:per hergranddaughter, patient had 5 episodes of coffee-ground emesis.Her hemoglobin is stable if not volume concentrated -holding Eliquis -gentle IVF -protonix -suspect vomiting was from gastroparesis - Zofran IV for nausea - Avoid NSAIDs and SQ heparin -had EGD in 2015 -If hemoglobin remains stable will consider restarting Eliquis.  Hyperkalemia:  -resolved  OJJ:KKXFG pressures are soft -hold home Bumex due toworsening  renal function  Atrial Fibrillation: CHA2DS2-VASc Scoreis 8, needs oral anticoagulation. Patient is on Eliquis at home. Heart rate is well controlled. -holding Eliquis for now -If hemoglobin remains stable will consider restarting Eliquis.  History of stroke: -Resume eliquis if no further episodes and Hgb stable  GERD (gastroesophageal reflux disease): -on IV protonix  Chronic systolic CHF (congestive heart failure) (HCC):2D echo 07/21/2018 showed EF of 35-40%. Patient does not have leg edema JVD. Clinically patient is dry. -Hold diuretics (Bumex and metolazone) due to worsening renal function -Heart failure physician following.  Appreciate help.  They recommend palliative.  Palliative care consult requested to discuss goals of care.  Acute renal failure superimposed on stage 3 chronic kidney disease (HCC):Baseline creatinine 1.2-1.3. Her creatinine today is 1.59, BUN 49.  - Monitor BMP  Fall: CT-head negative foracute intracranial abnormalities. -PT/OT  Hyponatremia:Na 130. Mental status is at her baseline. Likely due to poor oral intake, and continuation of diuretics. -cautious IVF   Family Communication/Anticipated D/C date and plan/Code Status   DVT prophylaxis: eliquis on hold (resume if hemoglobin remains stable) Code Status: Full Code.  Family Communication: granddaughter at bedside Disposition Plan: seems over the last 6 months to have a rapid decline with weight loss and difficult medication management due to poor PO intake. Palliative care consult requested.  Consultants:   Heart failure team, palliative care   Procedures:   None   Antimicrobials:  None    Subjective: Complaining of generalized weakness.  Denies having any chest pain, nausea, vomiting, hematemesis, abdominal pain or diarrhea.  Objective: Vitals:   05/16/19 0100 05/16/19 0441 05/16/19 0651 05/16/19 1342  BP:  100/71 101/85 96/78  Pulse: 60 60 61 62  Resp: 19 19 13  19   Temp:  97.7 F (36.5 C) 97.6 F (36.4 C) (!) 97 F (36.1 C)  TempSrc:  Oral Oral Oral  SpO2: 98%  100% 99%  Weight:  54.5 kg    Height:  5\' 3"  (1.6 m)      Intake/Output Summary (Last 24 hours) at 05/16/2019 1614 Last data filed at 05/16/2019 0400 Gross per 24 hour  Intake 709.72 ml  Output -  Net 709.72 ml   Filed Weights   05/16/19 0052 05/16/19 0441  Weight: 54.5 kg 54.5 kg    Examination:  General exam: Thin, elderly female, awake, not in any acute distress at this time Respiratory system: Decreased breath sounds lower lobes, otherwise clear to auscultation Cardiovascular system: S1 & S2 heard. No murmur. Gastrointestinal system: Abdomen is nondistended, soft and nontender. Normal bowel sounds heard. Central nervous system: Alert and oriented. No focal neurological deficits. Extremities: No edema. Skin: No rashes, lesions or ulcers Psychiatry: Mood & affect appropriate.     Data Reviewed: I have personally reviewed following labs and imaging studies  CBC: Recent Labs  Lab 05/14/19 1615 05/15/19 0516 05/16/19 0417  WBC 3.2* 3.3* 3.5*  NEUTROABS 2.2  --   --   HGB 15.5* 14.1 13.9  HCT 46.8* 43.1 42.0  MCV 91.6 91.9 91.5  PLT 162 137* 818*   Basic Metabolic Panel: Recent Labs  Lab 05/10/19 1335 05/14/19 2112 05/15/19 0516 05/16/19 0417  NA 131* 130* 131* 130*  K 3.1* 5.7* 4.4 4.5  CL 88* 98 98 99  CO2 29 20* 19* 20*  GLUCOSE 149* 112* 85 146*  BUN 71* 60* 55* 49*  CREATININE 1.30* 1.71* 1.61* 1.59*  CALCIUM 9.3 9.3 8.7* 8.8*  MG  --  2.8*  --   --    GFR: Estimated Creatinine Clearance: 21 mL/min (A) (by C-G formula based on SCr of 1.59 mg/dL (H)). Liver Function Tests: Recent Labs  Lab 05/14/19 2112  AST 46*  ALT 25  ALKPHOS 118  BILITOT 3.9*  PROT 8.2*  ALBUMIN 3.6   Recent Labs  Lab 05/14/19 2112  LIPASE 31   No results for input(s): AMMONIA in the last 168 hours. Coagulation Profile: Recent Labs  Lab 05/15/19 0516  INR  1.7*   Cardiac Enzymes: Recent Labs  Lab 05/15/19 0516  CKTOTAL 43   BNP (last 3 results) No results for input(s): PROBNP in the last 8760 hours. HbA1C: No results for input(s): HGBA1C in the last 72 hours. CBG: Recent Labs  Lab 05/15/19 1223 05/15/19 1744 05/16/19 0734 05/16/19 1116 05/16/19 1608  GLUCAP 69* 108* 92 122* 102*   Lipid Profile: No results for input(s): CHOL, HDL, LDLCALC, TRIG, CHOLHDL, LDLDIRECT in the last 72 hours. Thyroid Function Tests: No results for input(s): TSH, T4TOTAL, FREET4, T3FREE, THYROIDAB in the last 72 hours. Anemia Panel: No results for input(s): VITAMINB12, FOLATE, FERRITIN, TIBC, IRON, RETICCTPCT in the last 72 hours. Sepsis Labs: No results for input(s): PROCALCITON, LATICACIDVEN in the last 168 hours.  Recent Results (from the past 240 hour(s))  SARS CORONAVIRUS 2 (TAT 6-24 HRS) Nasopharyngeal Nasopharyngeal Swab     Status: None   Collection Time: 05/14/19 10:10 PM   Specimen: Nasopharyngeal Swab  Result Value Ref Range Status   SARS Coronavirus 2 NEGATIVE NEGATIVE Final    Comment: (NOTE) SARS-CoV-2 target nucleic acids are NOT DETECTED. The SARS-CoV-2 RNA is generally detectable in upper and lower respiratory specimens during the acute phase of infection. Negative results do not preclude SARS-CoV-2 infection, do not rule  out co-infections with other pathogens, and should not be used as the sole basis for treatment or other patient management decisions. Negative results must be combined with clinical observations, patient history, and epidemiological information. The expected result is Negative. Fact Sheet for Patients: SugarRoll.be Fact Sheet for Healthcare Providers: https://www.woods-mathews.com/ This test is not yet approved or cleared by the [place] FDA and  has been authorized for detection and/or diagnosis of SARS-CoV-2 by FDA under an Emergency Use Authorization (EUA).  This EUA will remain  in effect (meaning this test can be used) for the duration of the COVID-19 declaration under Section 56 4(b)(1) of the Act, 21 U.S.C. section 360bbb-3(b)(1), unless the authorization is terminated or revoked sooner. Performed at [facility], [place] [address], [place], [place] [address]          Radiology Studies: Ct Abdomen Pelvis Wo Contrast  Result Date: 05/14/2019 CLINICAL DATA:  Nausea and vomiting EXAM: CT ABDOMEN AND PELVIS WITHOUT CONTRAST TECHNIQUE: Multidetector CT imaging of the abdomen and pelvis was performed following the standard protocol without IV contrast. COMPARISON:  02/09/2014 FINDINGS: Lower chest: No focal infiltrate or sizable effusion is seen. Heart is enlarged in size. Pacing device is noted. Hepatobiliary: No focal liver abnormality is seen. No gallstones, gallbladder wall thickening, or biliary dilatation. Pancreas: Unremarkable. No pancreatic ductal dilatation or surrounding inflammatory changes. Spleen: Normal in size without focal abnormality. Adrenals/Urinary Tract: Adrenal glands are within normal limits. Large upper pole renal cyst is noted on the right stable from the prior exam. No obstructive changes are seen. Atrophy of the lower pole is noted new from the prior exam likely related to prior infarct. Right kidney demonstrates no renal calculi or obstructive changes. The bladder is well distended. Stomach/Bowel: Scattered diverticular changes noted without evidence of diverticulitis. The appendix is not well visualized. No inflammatory changes to suggest appendicitis are noted. No gastric or small bowel abnormality is noted. Vascular/Lymphatic: Aortic atherosclerosis. No enlarged abdominal or pelvic lymph nodes. Reproductive: Status post hysterectomy. No adnexal masses. Other: No hernia is identified.  Mild changes of anasarca are noted. Musculoskeletal: Right hip replacement is noted. Degenerative changes of lumbar spine are seen.  IMPRESSION: Lower pole renal atrophy on the left consistent with prior infarct. Mild changes of anasarca. Diverticulosis without diverticulitis. Electronically Signed   By: Inez Catalina M.D.   On: 05/14/2019 22:56   Ct Head Wo Contrast  Result Date: 05/14/2019 CLINICAL DATA:  Headache emesis EXAM: CT HEAD WITHOUT CONTRAST TECHNIQUE: Contiguous axial images were obtained from the base of the skull through the vertex without intravenous contrast. COMPARISON:  CT brain 01/07/2017 FINDINGS: Brain: No acute territorial infarction, hemorrhage or intracranial mass. Atrophy and mild small vessel ischemic changes of the white matter. Probable chronic lacunar infarcts in the basal ganglia. Stable ventricle size Vascular: No hyperdense vessels.  Carotid vascular calcification Skull: Normal. Negative for fracture or focal lesion. Sinuses/Orbits: Mucosal thickening in the ethmoid sinuses Other: None IMPRESSION: 1. No CT evidence for acute intracranial abnormality. 2. Atrophy and small vessel ischemic changes of the white matter Electronically Signed   By: Donavan Foil M.D.   On: 05/14/2019 22:51   Dg Chest Portable 1 View  Result Date: 05/14/2019 CLINICAL DATA:  Shortness of breath EXAM: PORTABLE CHEST 1 VIEW COMPARISON:  08/17/2018 FINDINGS: Left-sided multi lead pacing device. Cardiomegaly with aortic atherosclerosis. No consolidation or effusion. No focal airspace disease. IMPRESSION: No active disease.  Stable cardiomegaly. Electronically Signed   By: Madie Reno.D.  On: 05/14/2019 17:00        Scheduled Meds: . apixaban  2.5 mg Oral BID  . bumetanide  4 mg Oral BID  . dextrose  50 mL Intravenous Once  . latanoprost  1 drop Both Eyes QHS  . loratadine  10 mg Oral Daily  . pantoprazole  40 mg Intravenous Q12H  . senna-docusate  1 tablet Oral BID  . timolol  1 drop Both Eyes Daily   Continuous Infusions:   LOS: 1 day    Yaakov Guthrie, MD [facility] Pager on amion  If 7PM-7AM,  please contact night-coverage [email or web address] Password [facility] 05/16/2019, 4:14 PM

## 2019-05-16 NOTE — Progress Notes (Signed)
RT NOTE:  Patient family stated that the patient wears 2 L at home and hasn't been wearing the CPAP. Pt resting comfortably currently with [place].  RT will continue to monitor.

## 2019-05-16 NOTE — Progress Notes (Signed)
Occupational Therapy Evaluation Patient Details Name: Kim Mullins MRN: [id number] DOB: December 21, 1929 Today's Date: 05/16/2019    History of Present Illness 85yo female presentijng with general weakness, coffee ground emesis. Recent fall. PMH HTN, DM, asthma on home O2, CVA, complete heart block s/p ICD, CHF with EF 35%, CKD stage III, A-fib, hx tibial fx, cardiac cath, THR, total shoulder arthroscopy   Clinical Impression   PTA, pt was living at home with her grand-daughter since June, grand-daughter reports she will be staying with pt until the end of the year. Per grand-daughter, until June pt was independent with ADL/IADL and modified independent with functional mobility at rollator level. Recently, pt has been requiring more support during ADL as preference due to increased fatigue with task completion. Pt's grand-daughter reports it sometimes takes pt all day to complete a simple ADL. Pt has had 2 falls since grand-daughter arrived in June. Pt currently requires modA for sit<>stand and stand-pivot transfer from EOB to [facility]. Pt required modA for safe use of DME. Pt and grand-daughter report cognitive limitations at baseline (see cognition section) impacting her safety and independence during the day. Due to decline in current level of function, pt would benefit from acute OT to address established goals to facilitate safe D/C to venue listed below. At this time, recommend HHOT follow-up. Will continue to follow acutely.     Follow Up Recommendations  Home health OT;Supervision - Intermittent(with mobility)    Equipment Recommendations  None recommended by OT    Recommendations for Other Services       Precautions / Restrictions Precautions Precautions: Fall;ICD/Pacemaker Restrictions Weight Bearing Restrictions: No      Mobility Bed Mobility Overal bed mobility: Needs Assistance Bed Mobility: Supine to Sit;Sit to Supine     Supine to sit: Min guard Sit to supine: Min  guard      Transfers Overall transfer level: Needs assistance Equipment used: Rolling walker (2 wheeled) Transfers: Sit to/from Stand Sit to Stand: Mod assist         General transfer comment: ModA to boost to full standing, posterior LOB noted which required ModA to correct;modA for safe use of DMe    Balance Overall balance assessment: Needs assistance;History of Falls Sitting-balance support: Bilateral upper extremity supported;Feet supported Sitting balance-Leahy Scale: Fair     Standing balance support: Bilateral upper extremity supported;During functional activity Standing balance-Leahy Scale: Poor Standing balance comment: reliant on external support, ModA to maintain balance with RW                           ADL either performed or assessed with clinical judgement   ADL Overall ADL's : Needs assistance/impaired Eating/Feeding: Minimal assistance;Sitting Eating/Feeding Details (indicate cue type and reason): RN reports grand-daughter was assisting with feeding due to decreased activity tolerance Grooming: Minimal assistance   Upper Body Bathing: Minimal assistance;Sitting   Lower Body Bathing: Moderate assistance;Sit to/from stand   Upper Body Dressing : Minimal assistance;Sitting   Lower Body Dressing: Moderate assistance;Sit to/from stand   Toilet Transfer: Moderate assistance;RW;Stand-pivot;BSC Toilet Transfer Details (indicate cue type and reason): transfer from EOB to BSC;modA for safe use of DME Toileting- Clothing Manipulation and Hygiene: Maximal assistance;Sit to/from stand;Moderate assistance Toileting - Clothing Manipulation Details (indicate cue type and reason): modA for sit<>stand;assist for posterior pericare, pt reliant on BUE in standing     Functional mobility during ADLs: Moderate assistance;Rolling walker General ADL Comments: modA for safe use of RW;pt limited  by decreased activity tolerance, decreased stability, and cognitive  limitations     Vision Baseline Vision/History: Wears glasses Patient Visual Report: No change from baseline       Perception     Praxis      Pertinent Vitals/Pain Pain Assessment: No/denies pain Pain Intervention(s): Monitored during session     Hand Dominance Right   Extremity/Trunk Assessment Upper Extremity Assessment Upper Extremity Assessment: Generalized weakness   Lower Extremity Assessment Lower Extremity Assessment: Generalized weakness   Cervical / Trunk Assessment Cervical / Trunk Assessment: Kyphotic   Communication Communication Communication: HOH   Cognition Arousal/Alertness: Awake/alert Behavior During Therapy: Flat affect Overall Cognitive Status: History of cognitive impairments - at baseline Area of Impairment: Memory;Following commands;Safety/judgement;Awareness;Problem solving                     Memory: Decreased recall of precautions;Decreased short-term memory Following Commands: Follows one step commands with increased time;Follows multi-step commands inconsistently Safety/Judgement: Decreased awareness of safety;Decreased awareness of deficits Awareness: Emergent Problem Solving: Slow processing;Requires verbal cues;Requires tactile cues General Comments: pt demonstrates decreased safety awareness required max vc for safe hand placement and physical assistance for safe use of DME;pt's grand-daughter reports pt has difficulty with orientation after naps/sleeping states sometimes pt thinks it is the next day despite visual cues from enviornment (time of day);educated family on compensatory strategies to assist with increasing pt's awareness/orientation;discussed fall prevention strategy   General Comments  grand-daughter present during session;educated pt and grand-daughter on fall prevention strategies, energy conservation strategies and importance of grand-daughter assisting pt with mobility    Exercises     Shoulder Instructions       Home Living Family/patient expects to be discharged to:: Private residence Living Arrangements: Alone Available Help at Discharge: Family;Available 24 hours/day Type of Home: House Home Access: Stairs to enter [employer] of Steps: 1 Entrance Stairs-Rails: None Home Layout: One level     Bathroom Shower/Tub: Teacher, early years/pre: Standard     Home Equipment: Environmental consultant - 2 wheels;Walker - 4 wheels;Cane - single point;Bedside commode;Shower seat;Toilet riser   Additional Comments: usually lives alone, grand-daughter is with her now 24/7 as she is able to work from home during the pandemic but family usually cannot be with her      Prior Functioning/Environment Level of Independence: Independent with assistive device(s)        Comments: has rollator, has been getting weaker per grand-daughter and has good days where she can walk 10 minutes plus, other days she has a hard time even getting around the house        OT Problem List: Decreased range of motion;Decreased activity tolerance;Decreased strength;Impaired balance (sitting and/or standing);Decreased cognition;Decreased safety awareness;Decreased knowledge of use of DME or AE;Decreased knowledge of precautions;Cardiopulmonary status limiting activity      OT Treatment/Interventions: Self-care/ADL training;Therapeutic exercise;Energy conservation;DME and/or AE instruction;Therapeutic activities;Patient/family education;Balance training;Cognitive remediation/compensation    OT Goals(Current goals can be found in the care plan section) Acute Rehab OT Goals Patient Stated Goal: to not fall OT Goal Formulation: With patient Time For Goal Achievement: 05/30/19 Potential to Achieve Goals: Good ADL Goals Pt Will Perform Grooming: with modified independence Pt Will Perform Upper Body Dressing: with modified independence Pt Will Perform Lower Body Dressing: with modified independence;sit to/from stand Pt  Will Transfer to Toilet: with modified independence;ambulating Additional ADL Goal #1: Pt will demonstrate independence with 3 fall prevention strategies. Additional ADL Goal #2: Pt will demonstrate independence with 3 energy  conservation strategies during ADL completion.  OT Frequency: Min 2X/week   Barriers to D/C: Decreased caregiver support  grand-daughter is only available until end of year       Co-evaluation              AM-PAC OT "6 Clicks" Daily Activity     Outcome Measure Help from another person eating meals?: A Little Help from another person taking care of personal grooming?: A Little Help from another person toileting, which includes using toliet, bedpan, or urinal?: A Lot Help from another person bathing (including washing, rinsing, drying)?: A Little Help from another person to put on and taking off regular upper body clothing?: A Little Help from another person to put on and taking off regular lower body clothing?: A Lot 6 Click Score: 16   End of Session Equipment Utilized During Treatment: Gait belt;Rolling walker;Oxygen Nurse Communication: Mobility status  Activity Tolerance: Patient tolerated treatment well Patient left: in bed;with call bell/phone within reach;with bed alarm set;with family/visitor present  OT Visit Diagnosis: Unsteadiness on feet (R26.81);Other abnormalities of gait and mobility (R26.89);Muscle weakness (generalized) (M62.81);History of falling (Z91.81);Repeated falls (R29.6);Other symptoms and signs involving cognitive function                Time: 1121-1150 OT Time Calculation (min): 29 min Charges:  OT General Charges $OT Visit: 1 Visit OT Evaluation $OT Eval Moderate Complexity: 1 Mod OT Treatments $Self Care/Home Management : 8-22 mins  Dorinda Hill OTR/L Acute Rehabilitation Services Office: [place] 05/16/2019, 12:00 PM

## 2019-05-17 DIAGNOSIS — Z515 Encounter for palliative care: Secondary | ICD-10-CM

## 2019-05-17 DIAGNOSIS — I4811 Longstanding persistent atrial fibrillation: Secondary | ICD-10-CM

## 2019-05-17 DIAGNOSIS — I43 Cardiomyopathy in diseases classified elsewhere: Secondary | ICD-10-CM

## 2019-05-17 DIAGNOSIS — E854 Organ-limited amyloidosis: Secondary | ICD-10-CM

## 2019-05-17 LAB — CBC
HCT: 39.8 % (ref 36.0–46.0)
Hemoglobin: 13 g/dL (ref 12.0–15.0)
MCH: 29.7 pg (ref 26.0–34.0)
MCHC: 32.7 g/dL (ref 30.0–36.0)
MCV: 91.1 fL (ref 80.0–100.0)
Platelets: 141 10*3/uL — ABNORMAL LOW (ref 150–400)
RBC: 4.37 MIL/uL (ref 3.87–5.11)
RDW: 17.2 % — ABNORMAL HIGH (ref 11.5–15.5)
WBC: 3.5 10*3/uL — ABNORMAL LOW (ref 4.0–10.5)
nRBC: 0 % (ref 0.0–0.2)

## 2019-05-17 LAB — GLUCOSE, CAPILLARY: Glucose-Capillary: 116 mg/dL — ABNORMAL HIGH (ref 70–99)

## 2019-05-17 LAB — BASIC METABOLIC PANEL
Anion gap: 11 (ref 5–15)
BUN: 46 mg/dL — ABNORMAL HIGH (ref 8–23)
CO2: 23 mmol/L (ref 22–32)
Calcium: 8.6 mg/dL — ABNORMAL LOW (ref 8.9–10.3)
Chloride: 98 mmol/L (ref 98–111)
Creatinine, Ser: 1.72 mg/dL — ABNORMAL HIGH (ref 0.44–1.00)
GFR calc Af Amer: 31 mL/min — ABNORMAL LOW (ref >60)
GFR calc non Af Amer: 26 mL/min — ABNORMAL LOW (ref >60)
Glucose, Bld: 106 mg/dL — ABNORMAL HIGH (ref 70–99)
Potassium: 3.6 mmol/L (ref 3.5–5.1)
Sodium: 132 mmol/L — ABNORMAL LOW (ref 135–145)

## 2019-05-17 MED ORDER — ENSURE ENLIVE PO LIQD
237.0000 mL | Freq: Two times a day (BID) | ORAL | Status: DC
  Administered 2019-05-17 – 2019-05-25 (×13): 237 mL via ORAL

## 2019-05-17 MED ORDER — SIMETHICONE 80 MG PO CHEW
80.0000 mg | CHEWABLE_TABLET | Freq: Four times a day (QID) | ORAL | Status: DC
  Administered 2019-05-17 – 2019-05-25 (×26): 80 mg via ORAL
  Filled 2019-05-17 (×27): qty 1

## 2019-05-17 MED ORDER — BISACODYL 5 MG PO TBEC
10.0000 mg | DELAYED_RELEASE_TABLET | Freq: Every day | ORAL | Status: DC | PRN
  Administered 2019-05-22: 09:00:00 10 mg via ORAL
  Filled 2019-05-17: qty 2

## 2019-05-17 MED ORDER — POTASSIUM CHLORIDE CRYS ER 20 MEQ PO TBCR
40.0000 meq | EXTENDED_RELEASE_TABLET | Freq: Once | ORAL | Status: AC
  Administered 2019-05-17: 40 meq via ORAL
  Filled 2019-05-17: qty 2

## 2019-05-17 MED ORDER — SENNA 8.6 MG PO TABS
2.0000 | ORAL_TABLET | Freq: Every day | ORAL | Status: DC
  Filled 2019-05-17: qty 2

## 2019-05-17 NOTE — Plan of Care (Signed)
  Problem: Education: Goal: Knowledge of General Education information will improve Description: Including pain rating scale, medication(s)/side effects and non-pharmacologic comfort measures Outcome: Progressing   Problem: Nutrition: Goal: Adequate nutrition will be maintained Outcome: Progressing   Problem: Coping: Goal: Level of anxiety will decrease Outcome: Progressing   Problem: Elimination: Goal: Will not experience complications related to urinary retention Outcome: Progressing   Problem: Pain Managment: Goal: General experience of comfort will improve Outcome: Progressing   

## 2019-05-17 NOTE — Progress Notes (Signed)
Initial Nutrition Assessment  INTERVENTION:   -Ensure Enlive po BID, each supplement provides 350 kcal and 20 grams of protein  NUTRITION DIAGNOSIS:   Increased nutrient needs related to chronic illness as evidenced by estimated needs.  GOAL:   Patient will meet greater than or equal to 90% of their needs  MONITOR:   PO intake, Supplement acceptance, Labs, Weight trends, I & O's  REASON FOR ASSESSMENT:   Malnutrition Screening Tool    ASSESSMENT:   85 y.o. female with medical history significant of hypertension, diet-controlled diabetes, asthma, on 2 to 3 L nasal cannula oxygen at home, stroke, GERD, OSA on CPAP, complete heart block, s/p of pacemaker placement, CHF with EF 35%, CKD-III, who presented with generalized weakness and dark emesis. Patient has generalized weakness.  She also has decreased appetite and poor oral intake recently.  **RD working remotely**  Patient currently consuming 90% of full liquid meals. Pt reports an improvement in appetite. PTA pt was not eating well with poor appetite. Per chart review, palliative care consulted for poor prognosis related to end stage CHF.  Will order Ensure supplements for additional kcal and protein.  Admission weight: 120 lbs. Current weight: 113 lbs. Per weight records, pt has lost 37 lbs since 12/21 (24% wt loss x 9 months, significant for time frame). Suspect some degree of malnutrition. Pt with history of CHF, overall weight trend is weight loss with some fluctuations over the past year.  I/Os: +59 ml UOP 9/16: 1L  Medications reviewed. Labs reviewed: CBGs: 116-140 Low Na GFR: 31  NUTRITION - FOCUSED PHYSICAL EXAM:  Unable to perform - working remotely.  Diet Order:   Diet Order            Diet full liquid Room service appropriate? No; Fluid consistency: Thin  Diet effective now              EDUCATION NEEDS:   No education needs have been identified at this time  Skin:  Skin Assessment: Reviewed RN  Assessment  Last BM:  PTA  Height:   Ht Readings from Last 1 Encounters:  05/16/19 5\' 3"  (1.6 m)    Weight:   Wt Readings from Last 1 Encounters:  05/17/19 51.4 kg    Ideal Body Weight:  52.3 kg  BMI:  Body mass index is 20.07 kg/m.  Estimated Nutritional Needs:   Kcal:  1400-1600  Protein:  60-70g  Fluid:  1.6L/day  Clayton Bibles, MS, RD, LDN Inpatient Clinical Dietitian Pager: [phone] After Hours Pager: [phone]

## 2019-05-17 NOTE — Consult Note (Signed)
Consultation Note Date: 05/17/2019   Patient Name: Kim Mullins  DOB: [birth year]  MRN: [id number]  Age / Sex: 85 y.o., female  PCP: Kim Landsman, MD Referring Physician: Yaakov Guthrie, MD  Reason for Consultation: Establishing goals of care  HPI/Patient Profile: 85 y.o. female  with past medical history of OSA, CVA, DM, CHF, Afib, complete heart block s/p pace maker, cardiac amyloidosis who was admitted on 05/14/2019 with reported hematemsis x 5 episodes.   Clinical Assessment and Goals of Care:  I have reviewed medical records including EPIC notes, labs and imaging, received report from the care team, assessed the patient and then met at the bedside along with her Kim Mullins  to discuss diagnosis prognosis, GOC, EOL wishes, disposition and options.  I introduced Palliative Medicine as specialized medical care for people living with serious illness. It focuses on providing relief from the symptoms and stress of a serious illness. The goal is to improve quality of life for both the patient and the family.  We discussed a brief life review of the patient. Kim Mullins has her PhD.  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  She had 1 daughter who died at the age of 60.  She raised her 1 grand daughter, Kim Mullins.  Kim Mullins became a Research officer, political party.  She is currently working from home (due to [place]) but is taking advantage of the opportunity to care for her grand mother.  As far as functional and nutritional status Kim Mullins has had poor appetite for a long time.  There is concern that for the last 6 months she has declined rapidly.  She actually accepted Palliative Care services from [facility] back in December 2019, but somehow the services fell thru and did not materialize.    We discussed her  current illness and what it means in the larger context of her on-going co-morbidities.  Natural disease trajectory and expectations at EOL were discussed.  I attempted to elicit values and goals of care important to the patient.  The difference between aggressive medical intervention and comfort care was considered in light of the patient's goals of care. Advanced directives, concepts specific to code status, artifical feeding and hydration, and rehospitalization were considered and discussed.  A MOST form was reviewed with Kim Mullins at the request of Kim Mullins.  Kim Mullins wanted to consider all of the information given and did not complete the form today.  We will revisit the MOST form in a follow up meeting tomorrow.  Hospice and Palliative Care services outpatient were explained and offered.  Questions and concerns were addressed.  Hard Choices booklet left for review. The family was encouraged to call with questions or concerns.   Primary Decision Maker:  PATIENT.  Kim Mullins is of clear mind.   HCPOA if needed is Museum/gallery exhibitions officer    SUMMARY OF RECOMMENDATIONS     MOST form reviewed.  Will revisit with the patient  again tomorrow.  Patient will DC to rehab for a Maximum of 20 days.  Then patient and family will reassess.  Kim Mullins is hopeful her grandmother will regain some strength to be able to stand and walk a few steps.  Palliative to follow at [facility] rehab.  Please put in DC summary "Palliative to follow at SNF"  If patient does not improve at [facility] [place] plans to take her home with Hospice services.  Code Status/Advance Care Planning:  Full code - patient considering   Symptom Management:   Constipation:  Senna 2 tabs daily.  Ducolax PRN  Gas:  Simethicone.  Pulse ox DC'd   Psycho-social/Spiritual:   Desire for further Chaplaincy support:  Not discussed  Prognosis:   Less than 6 months.  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  She has had a rapid decline in the last 6 months.    Discharge Planning: [facility] for rehab with Palliative care service follow-up      Primary Diagnoses: Present on Admission: . Hematemesis . Pacemaker . Chronic systolic CHF (congestive heart failure) ([place]) . Acute renal failure superimposed on stage 3 chronic kidney disease ([place]) . Atrial fibrillation ([place]) . Benign essential HTN . GERD (gastroesophageal reflux disease) . Fall . FTT (failure to thrive) in adult   I have reviewed the medical record, interviewed the patient and family, and examined the patient. The following aspects are pertinent.  Past Medical History:  Diagnosis Date  . Anemia    occassionally  . Arthritis    all over.  . Asthma    Allergixc reaction to cats.  . Atrial fibrillation ([place])   . Bowel obstruction ([place])   . Chronic systolic CHF (congestive heart failure) (HCC)    EF 30-35% by cath, echo 2016 EF 50%  . Complete heart block (HCC)    a. s/p MDT CRTP pacemaker  . DCM (dilated cardiomyopathy) ([place]) 08/16/2014   normal coronary arteries on cath with EF 30-45%.  EF now 50% by echo 11/2014  . Diabetes mellitus 2006   Diet and exercise controlled.  Marland Kitchen Dyspnea   . GERD (gastroesophageal reflux disease)    occ  . Glaucoma 08/17/2018  . Hypertension   . Left tibial fracture 2007  . OSA (obstructive sleep apnea) 10/23/2014   Moderate with AHI 21/hr  . Osteoarthritis of right shoulder region 06/26/2013  . Stroke [facility])    Social History   Socioeconomic History  . Marital status: Widowed    Spouse name: Not on file  . Number of children: 0  . Years of education: Not on file  . Highest education level: Doctorate  Occupational History  . Occupation: retired  Scientific laboratory technician  . Financial resource strain: Not hard at all  . Food insecurity    Worry: Never true    Inability: Never true  . Transportation needs    Medical: No    Non-medical: No  Tobacco Use  . Smoking  status: Former Smoker    Packs/day: 1.00    Years: 45.00    Pack years: 45.00    Types: Cigarettes    Quit date: 07/24/1978    Years since quitting: 40.8  . Smokeless tobacco: Never Used  Substance and Sexual Activity  . Alcohol use: Yes    Alcohol/week: 7.0 standard drinks    Types: 7 Glasses of wine per week    Comment: every day  . Drug use: No  . Sexual activity:  Not Currently  Lifestyle  . Physical activity    Days per week: Not on file    Minutes per session: Not on file  . Stress: Not on file  Relationships  . Social Herbalist on phone: Not on file    Gets together: Not on file    Attends religious service: Not on file    Active member of club or organization: Not on file    Attends meetings of clubs or organizations: Not on file    Relationship status: Not on file  Other Topics Concern  . Not on file  Social History Narrative   Lives alone   Family History  Problem Relation Age of Onset  . Dementia Mother   . Coronary artery disease Mother   . Cancer Father        blood ? type  . Diabetes Maternal Grandfather   . Anuerysm Daughter        brain  . Colon cancer Neg Hx    Scheduled Meds: . apixaban  2.5 mg Oral BID  . bumetanide  4 mg Oral BID  . dextrose  50 mL Intravenous Once  . feeding supplement (ENSURE ENLIVE)  237 mL Oral BID BM  . latanoprost  1 drop Both Eyes QHS  . loratadine  10 mg Oral Daily  . pantoprazole  40 mg Intravenous Q12H  . senna  2 tablet Oral QHS  . simethicone  80 mg Oral QID  . timolol  1 drop Both Eyes Daily   Continuous Infusions: PRN Meds:.acetaminophen **OR** acetaminophen, albuterol, bisacodyl, hydrALAZINE, ondansetron **OR** ondansetron (ZOFRAN) IV Allergies  Allergen Reactions  . Hydrocodone Other (See Comments)    Took away all energy and made her stop eating food for short time  . Percocet [Oxycodone-Acetaminophen] Other (See Comments)    Too away all energy and made her stop eating food for short time   . Eggs Or Egg-Derived Products Hives and Rash  . Mercurial Derivatives Itching and Rash  . Penicillins Hives and Rash    Has patient had a PCN reaction causing immediate rash, facial/tongue/throat swelling, SOB or lightheadedness with hypotension: Yes Has patient had a PCN reaction causing severe rash involving mucus membranes or skin necrosis: No Has patient had a PCN reaction that required hospitalization: No Has patient had a PCN reaction occurring within the last 10 years: Yes If all of the above answers are "NO", then may proceed with Cephalosporin use.  . Quinine Derivatives Hives and Rash   Review of Systems complained of constipation, gas, no further vomiting  Physical Exam  Thin frail female, awake, alert, pleasant, orientated x 3, HOH CV rrr Resp no distress Abdomen soft, nt, nd  Vital Signs: BP (!) 89/69 (BP Location: Left Arm)   Pulse (!) 58   Temp 97.6 F (36.4 C) (Oral)   Resp 18   Ht 5' 3"  (1.6 m)   Wt 51.4 kg   SpO2 100%   BMI 20.07 kg/m  Pain Scale: 0-10   Pain Score: 0-No pain   SpO2: SpO2: 100 % O2 Device:SpO2: 100 % O2 Flow Rate: .O2 Flow Rate (L/min): 2.5 L/min  IO: Intake/output summary:   Intake/Output Summary (Last 24 hours) at 05/17/2019 2028 Last data filed at 05/17/2019 1200 Gross per 24 hour  Intake 175 ml  Output 1300 ml  Net -1125 ml    LBM:   Baseline Weight: Weight: 54.5 kg Most recent weight: Weight: 51.4 kg  Palliative Assessment/Data: 30%     Time In: 1:00 Time Out: 2:10 Time Total: 70 min Visit consisted of counseling and education dealing with the complex and emotionally intense issues surrounding the need for palliative care and symptom management in the setting of serious and potentially life-threatening illness. Greater than 50%  of this time was spent counseling and coordinating care related to the above assessment and plan.  Signed by: Florentina Jenny, PA-C Palliative Medicine Pager: [phone]  Please  contact Palliative Medicine Team phone at [phone] for questions and concerns.  For individual provider: See Shea Evans

## 2019-05-17 NOTE — Progress Notes (Signed)
PROGRESS NOTE    Kim Mullins  LOV:[id number] DOB: 01-22-1930 DOA: 05/14/2019 PCP: Lin Landsman, MD   Brief Narrative:  Kim Pavon Wilsonis an 85 y.o.femalewith medical history significant ofhypertension, diet-controlled diabetes, asthma, on 2 to 3 L nasal cannula oxygen at home, stroke, GERD, OSA on CPAP, complete heart block,s/p ofpacemaker placement, CHF with EF 35%, CKD-III, who presented with generalized weaknessanddarkemesis. Patient has generalized weakness.She also has decreased appetite and poor oral intake recently.   Assessment & Plan:   Principal Problem:   Hematemesis Active Problems:   Benign essential HTN   Pacemaker   History of stroke   Atrial fibrillation (HCC)   GERD (gastroesophageal reflux disease)   Chronic systolic CHF (congestive heart failure) (HCC)   Acute renal failure superimposed on stage 3 chronic kidney disease (HCC)   Fall   Hyponatremia   Hyperkalemia   FTT (failure to thrive) in adult   FTT/contsitpation/hypoglycemia -per Mullins does not eat much -blood sugars was low previously.  Now improving.  Continue Accu-Cheks. Per Mullins in February palliativecare saw patient at home but she never heard back from them or hospice Per Dr. Aundra Dubin in last note:Poor prognosis with advanced HF in setting of nonischemic cardiomyopathy/cardiac amyloidosis.  -Consulted palliative care to discuss goals of care.  ReportedHematemesis:per hergranddaughter, patient had 5 episodes of coffee-ground emesis.Kim hemoglobin is stableif not volume concentrated -Since hemoglobin stable restarted Eliquis -protonix -suspect vomiting was from gastroparesis - Zofran IV for nausea - Avoid NSAIDs and SQ heparin -had EGD in 2015  Hyperkalemia:  -resolved  OAC:ZYSAY pressures are soft -hold home Bumex due toworsening renal function  Atrial Fibrillation: CHA2DS2-VASc Scoreis 8, needs oral anticoagulation. Patient is on  Eliquis at home. Heart rate is well controlled. -Since hemoglobin stable restarted Eliquis.  History of stroke: -Resumed eliquis. Hgb stable  GERD (gastroesophageal reflux disease): -on protonix  Chronic systolic CHF (congestive heart failure) (HCC):2D echo 07/21/2018 showed EF of 35-40%. Patient does not have leg edema.  -Hold diuretics (Bumex and metolazone) due to worsening renal function -Heart failure physician following.  Appreciate help.  They recommend palliative.  Palliative care consult requested to discuss goals of care.  Acute renal failure superimposed on stage 3 chronic kidney disease (HCC):Baseline creatinine 1.2-1.3. Kim creatinine today is 1.72, BUN 46.    Fall: CT-head negative foracute intracranial abnormalities. -PT/OT  Hyponatremia:Na 132. Mental status is at Kim baseline. Likely due to poor oral intake, and continuation of diuretics. -cautious IVF   Family Communication/Anticipated D/C date and plan/Code Status   DVT prophylaxis:eliquis Code Status:Full Code.  Family Communication:Previously discussed with Mullins Disposition Plan:seems over the last 6 months to have a rapid decline with weight loss and difficult medication management due to poor PO intake. Palliative care consult requested.  Consultants:   Heart failure team, palliative care   Procedures:   None   Antimicrobials:  None    Subjective: She has shortness of breath when she ambulates.  Okay at rest.  Objective: Vitals:   05/16/19 2009 05/16/19 2300 05/17/19 0607 05/17/19 1321  BP: 94/70  95/73 (!) 89/69  Pulse: 60 62 60 (!) 58  Resp: (!) 26 18  18   Temp: 97.8 F (36.6 C)  (!) 97.5 F (36.4 C) 97.6 F (36.4 C)  TempSrc: Oral  Oral Oral  SpO2: 97% 100% 100% 100%  Weight:   51.4 kg   Height:        Intake/Output Summary (Last 24 hours) at 05/17/2019 1859 Last data filed at 05/17/2019 1200  Gross per 24 hour  Intake 175 ml  Output 1300 ml   Net -1125 ml   Filed Weights   05/16/19 0052 05/16/19 0441 05/17/19 0607  Weight: 54.5 kg 54.5 kg 51.4 kg    Examination:  General exam: Appears calm and comfortable  Respiratory system: Decreased breath sounds lower lobes otherwise clear to auscultation Cardiovascular system: S1 & S2 heard. No murmur.  Gastrointestinal system: Abdomen is nondistended, soft and nontender. Normal bowel sounds heard. Central nervous system: Alert and oriented. No focal neurological deficits. Skin: No rashes, lesions or ulcers Psychiatry: Mood & affect appropriate.     Data Reviewed: I have personally reviewed following labs and imaging studies  CBC: Recent Labs  Lab 05/14/19 1615 05/15/19 0516 05/16/19 0417 05/17/19 0408  WBC 3.2* 3.3* 3.5* 3.5*  NEUTROABS 2.2  --   --   --   HGB 15.5* 14.1 13.9 13.0  HCT 46.8* 43.1 42.0 39.8  MCV 91.6 91.9 91.5 91.1  PLT 162 137* 141* 275*   Basic Metabolic Panel: Recent Labs  Lab 05/14/19 2112 05/15/19 0516 05/16/19 0417 05/17/19 0408  NA 130* 131* 130* 132*  K 5.7* 4.4 4.5 3.6  CL 98 98 99 98  CO2 20* 19* 20* 23  GLUCOSE 112* 85 146* 106*  BUN 60* 55* 49* 46*  CREATININE 1.71* 1.61* 1.59* 1.72*  CALCIUM 9.3 8.7* 8.8* 8.6*  MG 2.8*  --   --   --    GFR: Estimated Creatinine Clearance: 19.1 mL/min (A) (by C-G formula based on SCr of 1.72 mg/dL (H)). Liver Function Tests: Recent Labs  Lab 05/14/19 2112  AST 46*  ALT 25  ALKPHOS 118  BILITOT 3.9*  PROT 8.2*  ALBUMIN 3.6   Recent Labs  Lab 05/14/19 2112  LIPASE 31   No results for input(s): AMMONIA in the last 168 hours. Coagulation Profile: Recent Labs  Lab 05/15/19 0516  INR 1.7*   Cardiac Enzymes: Recent Labs  Lab 05/15/19 0516  CKTOTAL 43   BNP (last 3 results) No results for input(s): PROBNP in the last 8760 hours. HbA1C: No results for input(s): HGBA1C in the last 72 hours. CBG: Recent Labs  Lab 05/16/19 0734 05/16/19 1116 05/16/19 1608 05/16/19 2152  05/17/19 0804  GLUCAP 92 122* 102* 140* 116*   Lipid Profile: No results for input(s): CHOL, HDL, LDLCALC, TRIG, CHOLHDL, LDLDIRECT in the last 72 hours. Thyroid Function Tests: No results for input(s): TSH, T4TOTAL, FREET4, T3FREE, THYROIDAB in the last 72 hours. Anemia Panel: No results for input(s): VITAMINB12, FOLATE, FERRITIN, TIBC, IRON, RETICCTPCT in the last 72 hours. Sepsis Labs: No results for input(s): PROCALCITON, LATICACIDVEN in the last 168 hours.  Recent Results (from the past 240 hour(s))  SARS CORONAVIRUS 2 (TAT 6-24 HRS) Nasopharyngeal Nasopharyngeal Swab     Status: None   Collection Time: 05/14/19 10:10 PM   Specimen: Nasopharyngeal Swab  Result Value Ref Range Status   SARS Coronavirus 2 NEGATIVE NEGATIVE Final    Comment: (NOTE) SARS-CoV-2 target nucleic acids are NOT DETECTED. The SARS-CoV-2 RNA is generally detectable in upper and lower respiratory specimens during the acute phase of infection. Negative results do not preclude SARS-CoV-2 infection, do not rule out co-infections with other pathogens, and should not be used as the sole basis for treatment or other patient management decisions. Negative results must be combined with clinical observations, patient history, and epidemiological information. The expected result is Negative. Fact Sheet for Patients: SugarRoll.be Fact Sheet for Healthcare Providers: https://www.woods-mathews.com/ This  test is not yet approved or cleared by the [place] and  has been authorized for detection and/or diagnosis of SARS-CoV-2 by FDA under an Emergency Use Authorization (EUA). This EUA will remain  in effect (meaning this test can be used) for the duration of the COVID-19 declaration under Section 56 4(b)(1) of the Act, 21 U.S.C. section 360bbb-3(b)(1), unless the authorization is terminated or revoked sooner. Performed at [facility], [place] [address],  [place], [place] [address]          Radiology Studies: No results found.      Scheduled Meds: . apixaban  2.5 mg Oral BID  . bumetanide  4 mg Oral BID  . dextrose  50 mL Intravenous Once  . feeding supplement (ENSURE ENLIVE)  237 mL Oral BID BM  . latanoprost  1 drop Both Eyes QHS  . loratadine  10 mg Oral Daily  . pantoprazole  40 mg Intravenous Q12H  . senna  2 tablet Oral QHS  . simethicone  80 mg Oral QID  . timolol  1 drop Both Eyes Daily   Continuous Infusions:   LOS: 2 days    Yaakov Guthrie, MD [facility] Pager on amion  If 7PM-7AM, please contact night-coverage [email or web address] Password [facility] 05/17/2019, 6:59 PM

## 2019-05-17 NOTE — Progress Notes (Addendum)
Advanced Heart Failure Rounding Note  PCP-Cardiologist: No primary care provider on file.   Subjective:    Hungry this morning. Denies shortness of breath unless she walks around.    Objective:   Weight Range: 51.4 kg Body mass index is 20.07 kg/m.   Vital Signs:   Temp:  [97 F (36.1 C)-97.8 F (36.6 C)] 97.5 F (36.4 C) (09/17 0607) Pulse Rate:  [60-62] 60 (09/17 0607) Resp:  [18-26] 18 (09/16 2300) BP: (94-96)/(70-78) 95/73 (09/17 0607) SpO2:  [97 %-100 %] 100 % (09/17 0607) Weight:  [51.4 kg] 51.4 kg (09/17 0607)    Weight change: Filed Weights   05/16/19 0052 05/16/19 0441 05/17/19 0607  Weight: 54.5 kg 54.5 kg 51.4 kg    Intake/Output:   Intake/Output Summary (Last 24 hours) at 05/17/2019 0807 Last data filed at 05/17/2019 0700 Gross per 24 hour  Intake --  Output 1000 ml  Net -1000 ml      Physical Exam    General:  Thin. Sitting on the side of the bed. No resp difficulty HEENT: normal Neck: supple. JVP 8 cm  Carotids 2+ bilat; no bruits. No lymphadenopathy or thryomegaly appreciated. Cor: PMI nondisplaced. Regular rate & rhythm. No rubs, gallops or murmurs. Lungs: clear 2 liters oxygen Abdomen: soft, nontender, nondistended. No hepatosplenomegaly. No bruits or masses. Good bowel sounds. Extremities: no cyanosis, clubbing, rash, edema Neuro: alert & orientedx3, cranial nerves grossly intact. moves all 4 extremities w/o difficulty. Affect pleasant   Telemetry   NSR and BiV Paced 60s   EKG   n/a   Labs    CBC Recent Labs    05/14/19 1615  05/16/19 0417 05/17/19 0408  WBC 3.2*   < > 3.5* 3.5*  NEUTROABS 2.2  --   --   --   HGB 15.5*   < > 13.9 13.0  HCT 46.8*   < > 42.0 39.8  MCV 91.6   < > 91.5 91.1  PLT 162   < > 141* 141*   < > = values in this interval not displayed.   Basic Metabolic Panel Recent Labs    05/14/19 2112  05/16/19 0417 05/17/19 0408  NA 130*   < > 130* 132*  K 5.7*   < > 4.5 3.6  CL 98   < > 99 98  CO2  20*   < > 20* 23  GLUCOSE 112*   < > 146* 106*  BUN 60*   < > 49* 46*  CREATININE 1.71*   < > 1.59* 1.72*  CALCIUM 9.3   < > 8.8* 8.6*  MG 2.8*  --   --   --    < > = values in this interval not displayed.   Liver Function Tests Recent Labs    05/14/19 2112  AST 46*  ALT 25  ALKPHOS 118  BILITOT 3.9*  PROT 8.2*  ALBUMIN 3.6   Recent Labs    05/14/19 2112  LIPASE 31   Cardiac Enzymes Recent Labs    05/15/19 0516  CKTOTAL 43    BNP: BNP (last 3 results) Recent Labs    08/17/18 1332 10/09/18 1238 05/15/19 0516  BNP 2,006.0* 1,424.7* 1,217.6*    ProBNP (last 3 results) No results for input(s): PROBNP in the last 8760 hours.   D-Dimer No results for input(s): DDIMER in the last 72 hours. Hemoglobin A1C No results for input(s): HGBA1C in the last 72 hours. Fasting Lipid Panel No results for input(s):  CHOL, HDL, LDLCALC, TRIG, CHOLHDL, LDLDIRECT in the last 72 hours. Thyroid Function Tests No results for input(s): TSH, T4TOTAL, T3FREE, THYROIDAB in the last 72 hours.  Invalid input(s): FREET3  Other results:   Imaging    No results found.   Medications:     Scheduled Medications:  apixaban  2.5 mg Oral BID   bumetanide  4 mg Oral BID   dextrose  50 mL Intravenous Once   latanoprost  1 drop Both Eyes QHS   loratadine  10 mg Oral Daily   pantoprazole  40 mg Intravenous Q12H   senna-docusate  1 tablet Oral BID   timolol  1 drop Both Eyes Daily    Infusions:   PRN Medications: acetaminophen **OR** acetaminophen, albuterol, hydrALAZINE, ondansetron **OR** ondansetron (ZOFRAN) IV    Assessment/Plan  1. Hematemesis  Resolved. Hgb stable. Back on eliquis. No bleeding.  PPI ordered 2. Hyperkalemia Treated by primary team. Resolved 3. Failure to Thrive secondary to End Stage HF 4. Chronic systolic CHF: Nonischemic cardiomyopathy. MDT CRT-P device. ECHO 07/21/2018 with EF 35-40%, severe TR. PYP scan in 10/19 was highly suggestive  of TTR amyloidosis. Patient has had progressive worsening of dyspnea and volume overload. This is complicated by CKD stage IV.  - Volume status stable.  Continue  bumex 4 mg twice a day.  Hold metolazone.   No ACEI/ARB/spironolactone/ARNI/beta blocker with CKD stage IV and marginal BP.  5. 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 I think that with advanced age and advanced HF, she is unlikely to benefit significantly from tafamidis. 6. Atrial fibrillation: Paroxysmal. Maintaining NSR.  -Continue  Eliquis 2.5 mg twice a day.    7. CKD Stage IV: Creatinine baseline 1.5- 2.  Creatinine  1.7 today . Stable.  8. CHB: [employer] CRT-P.  9.. Chronic Hypoxic Respiratory Failure- continue 2 liters oxygen   SW following for possible placement .  Palliative Care consult pending. Ideally she would benefit from Hospice.   Length of Stay: 2  Darrick Grinder, NP  05/17/2019, 8:07 AM  Advanced Heart Failure Team Pager [phone] (M-F; 7a - 4p)  Please contact [facility] for night-coverage after hours (4p -7a ) and weekends on [email or web address]  Patient seen with NP, agree with the above note.   She feels like she is back at her baseline, mild dyspnea with exertion.  No hematemesis, nausea/vomiting.  Not short of breath at rest.  Creatinine mildly higher at 1.72. BP stable. Eliquis resumed with no evidence for GI bleeding.    On exam, JVP 8 cm, no edema. Telemetry shows NSR with BiV pacing.   I think she is at her baseline.  Suspect failure to thrive due to chronic medical conditions.  - Can continue her home bumetanide, 4 mg bid.  I do not think that she needs metolazone today.  - Hgb stable, back on home apixaban.  - PT recommend SNF versus HHPT with 24 hr care.  - Palliative care consult.   [name] 05/17/2019 8:31 AM

## 2019-05-17 NOTE — TOC Progression Note (Signed)
Transition of Care [facility]) - Progression Note    Patient Details  Name: Kim Mullins MRN: [id number] Date of Birth: Feb 02, 1930  [facility] CM/SW Contact  Eileen Stanford, LCSW Phone Number: 05/17/2019, 7:04 PM  Clinical Narrative:   Pt has chosen [facility], they have started [place].    Expected Discharge Plan: [place] Barriers to Discharge: Continued Medical Work up  Expected Discharge Plan and Services Expected Discharge Plan: [place]   Discharge Planning Services: NA   Living arrangements for the past 2 months: Single Family Home                                       Social Determinants of Health (SDOH) Interventions    Readmission Risk Interventions No flowsheet data found.

## 2019-05-18 DIAGNOSIS — K920 Hematemesis: Principal | ICD-10-CM

## 2019-05-18 DIAGNOSIS — I4819 Other persistent atrial fibrillation: Secondary | ICD-10-CM

## 2019-05-18 LAB — BASIC METABOLIC PANEL
Anion gap: 14 (ref 5–15)
BUN: 41 mg/dL — ABNORMAL HIGH (ref 8–23)
CO2: 20 mmol/L — ABNORMAL LOW (ref 22–32)
Calcium: 8.3 mg/dL — ABNORMAL LOW (ref 8.9–10.3)
Chloride: 98 mmol/L (ref 98–111)
Creatinine, Ser: 1.48 mg/dL — ABNORMAL HIGH (ref 0.44–1.00)
GFR calc Af Amer: 37 mL/min — ABNORMAL LOW (ref >60)
GFR calc non Af Amer: 32 mL/min — ABNORMAL LOW (ref >60)
Glucose, Bld: 130 mg/dL — ABNORMAL HIGH (ref 70–99)
Potassium: 3.2 mmol/L — ABNORMAL LOW (ref 3.5–5.1)
Sodium: 132 mmol/L — ABNORMAL LOW (ref 135–145)

## 2019-05-18 LAB — GLUCOSE, CAPILLARY
Glucose-Capillary: 115 mg/dL — ABNORMAL HIGH (ref 70–99)
Glucose-Capillary: 170 mg/dL — ABNORMAL HIGH (ref 70–99)
Glucose-Capillary: 110 mg/dL — ABNORMAL HIGH (ref 70–99)

## 2019-05-18 MED ORDER — SENNA 8.6 MG PO TABS
1.0000 | ORAL_TABLET | Freq: Every day | ORAL | Status: DC
  Administered 2019-05-20 – 2019-05-24 (×4): 8.6 mg via ORAL
  Filled 2019-05-18 (×5): qty 1

## 2019-05-18 MED ORDER — POTASSIUM CHLORIDE CRYS ER 20 MEQ PO TBCR
40.0000 meq | EXTENDED_RELEASE_TABLET | Freq: Once | ORAL | Status: AC
  Administered 2019-05-18: 06:00:00 40 meq via ORAL
  Filled 2019-05-18: qty 2

## 2019-05-18 MED ORDER — POTASSIUM CHLORIDE CRYS ER 20 MEQ PO TBCR
40.0000 meq | EXTENDED_RELEASE_TABLET | Freq: Once | ORAL | Status: AC
  Administered 2019-05-18: 17:00:00 40 meq via ORAL
  Filled 2019-05-18: qty 2

## 2019-05-18 MED ORDER — MAGNESIUM OXIDE 400 (241.3 MG) MG PO TABS
400.0000 mg | ORAL_TABLET | Freq: Every day | ORAL | Status: DC
  Administered 2019-05-18 – 2019-05-25 (×8): 400 mg via ORAL
  Filled 2019-05-18 (×8): qty 1

## 2019-05-18 NOTE — Progress Notes (Signed)
Occupational Therapy Treatment Patient Details Name: Kim Mullins MRN: [id number] DOB: 01-12-1930 Today's Date: 05/18/2019    History of present illness 85yo female presentijng with general weakness, coffee ground emesis. Recent fall. PMH HTN, DM, asthma on home O2, CVA, complete heart block s/p ICD, CHF with EF 35%, CKD stage III, A-fib, hx tibial fx, cardiac cath, THR, total shoulder arthroscopy   OT comments  Pt making steady progress towards OT goals this session. Prior to functional mobility HR 66 SaO2 on 3L 91% O2 at EOB; after functional mobility HR 98, poor waveform and difficult to get accurate reading d/t pts fingers being cold( attempted to place probe on ear lobe and toe) possible drop to low 80s; able to recover after education on pursed  lip breahting and seated rest break. Pt MIN A for sit>stand; MIN guard for functional mobility with RW. MOD Cues for safety with functional mobility. Cues needed for hand placement on RW and reaching for surfaces when sitting. Pt able to complete LB dressing from EOB with supervision for safety. Granddaughter present intermittently during session stating pt likely to DC to SNF for further skilled therapy. Pt lives alone and granddaughter from [place], feel new DC plan is appropriate to maximize functional independence. Will alert OTR about change in DC plan. Will continue to follow for acute OT needs.   Follow Up Recommendations  SNF;Other (comment)(as requested by granddaughter)    Equipment Recommendations  None recommended by OT    Recommendations for Other Services      Precautions / Restrictions Precautions Precautions: Fall;ICD/Pacemaker Restrictions Weight Bearing Restrictions: No       Mobility Bed Mobility Overal bed mobility: Needs Assistance Bed Mobility: Supine to Sit;Sit to Supine     Supine to sit: Min guard Sit to supine: Modified independent (Device/Increase time)   General bed mobility comments: asssit to  elevate trunk supine>sit; MOD for sit>supine with use of bed rails  Transfers Overall transfer level: Needs assistance Equipment used: Rolling walker (2 wheeled) Transfers: Sit to/from Stand Sit to Stand: Min guard         General transfer comment: MIN guard for safety; cues for hand placement    Balance Overall balance assessment: Needs assistance;History of Falls Sitting-balance support: Bilateral upper extremity supported;Feet supported Sitting balance-Leahy Scale: Fair     Standing balance support: Bilateral upper extremity supported Standing balance-Leahy Scale: Poor Standing balance comment: reliant on BUE                           ADL either performed or assessed with clinical judgement   ADL Overall ADL's : Needs assistance/impaired                     Lower Body Dressing: Supervision/safety;Sit to/from stand Lower Body Dressing Details (indicate cue type and reason): able to don socks sitting EOB Toilet Transfer: RW;Min guard;Cueing for Office manager Details (indicate cue type and reason): simulated with functional mobility; MIN guard for safety; cues for hand placement         Functional mobility during ADLs: Min guard;Rolling walker General ADL Comments: MIN guard with RW; pt de sats with activity; education on pursed lip breathing; declined further ADLs     Vision Baseline Vision/History: Wears glasses Patient Visual Report: No change from baseline     Perception     Praxis      Cognition Arousal/Alertness: Awake/alert Behavior During Therapy: WFL for tasks assessed/performed  Overall Cognitive Status: History of cognitive impairments - at baseline Area of Impairment: Memory;Following commands;Safety/judgement;Awareness;Problem solving                     Memory: Decreased recall of precautions;Decreased short-term memory Following Commands: Follows one step commands with increased time;Follows multi-step  commands with increased time Safety/Judgement: Decreased awareness of safety;Decreased awareness of deficits Awareness: Emergent Problem Solving: Slow processing;Requires verbal cues;Requires tactile cues General Comments: verbal and tactile cues for safe use of RW         Exercises     Shoulder Instructions       General Comments prior to functional mobility HR 66 SaO2 on 3L 91% O2 at EOB; after functional mobility HR 98, poor waveform and difficult to get accurate reading d/t pts fingers cold( attempted to place probe on ear lobe and toe) possible drop to low 80s; able to recover after education on pursed  lip breahting and seated rest break    Pertinent Vitals/ Pain       Pain Assessment: No/denies pain  Home Living                                          Prior Functioning/Environment              Frequency  Min 2X/week        Progress Toward Goals  OT Goals(current goals can now be found in the care plan section)  Progress towards OT goals: Progressing toward goals  Acute Rehab OT Goals Time For Goal Achievement: 05/30/19 Potential to Achieve Goals: Good  Plan Discharge plan needs to be updated    Co-evaluation                 AM-PAC OT "6 Clicks" Daily Activity     Outcome Measure   Help from another person eating meals?: A Little Help from another person taking care of personal grooming?: A Little Help from another person toileting, which includes using toliet, bedpan, or urinal?: A Lot Help from another person bathing (including washing, rinsing, drying)?: A Little Help from another person to put on and taking off regular upper body clothing?: A Little Help from another person to put on and taking off regular lower body clothing?: A Lot 6 Click Score: 16    End of Session Equipment Utilized During Treatment: Rolling walker;Oxygen  OT Visit Diagnosis: Unsteadiness on feet (R26.81);Other abnormalities of gait and mobility  (R26.89);Muscle weakness (generalized) (M62.81);History of falling (Z91.81);Repeated falls (R29.6);Other symptoms and signs involving cognitive function   Activity Tolerance Patient tolerated treatment well   Patient Left in bed;with call bell/phone within reach;with family/visitor present   Nurse Communication Mobility status;Other (comment)(O2 probe on toe)        Time: 1430-1510 OT Time Calculation (min): 40 min  Charges: OT General Charges $OT Visit: 1 Visit OT Treatments $Self Care/Home Management : 8-22 mins $Therapeutic Activity: 23-37 mins  [place], [place] [facility] [phone] [place] 05/18/2019, 4:14 PM

## 2019-05-18 NOTE — Progress Notes (Signed)
PROGRESS NOTE    Kim Mullins  HAL:[id number] DOB: [birth year] DOA: 05/14/2019 PCP: Lin Landsman, MD    Brief Narrative:  Kim Mullins an 85 y.o.femalewith medical history significant ofhypertension, diet-controlled diabetes, asthma, on 2 to 3 L nasal cannula oxygen at home, stroke, GERD, OSA on CPAP, complete heart block,s/p ofpacemaker placement, CHF with EF 35%, CKD-III, who presentedwith generalized weaknessanddarkemesis. Patient has generalized weakness.She also has decreased appetite and poor oral intake recently.   Assessment & Plan:   Principal Problem:   Hematemesis Active Problems:   Benign essential HTN   Pacemaker   History of stroke   Atrial fibrillation (HCC)   GERD (gastroesophageal reflux disease)   Chronic systolic CHF (congestive heart failure) (HCC)   Acute renal failure superimposed on stage 3 chronic kidney disease (HCC)   Fall   Hyponatremia   Hyperkalemia   FTT (failure to thrive) in adult   Cardiac amyloidosis (HCC)   FTT/contsitpation/hypoglycemia -per granddaughter does not eat much -blood sugarswas low previously. Now improving. Continue Accu-Cheks. Per granddaughter in February palliativecare saw patient at home but she never heard back from them or hospice Per Dr. Mclean:Poor prognosis with advanced HF in setting of nonischemic cardiomyopathy/cardiac amyloidosis. -Consulted palliative care to discuss goals of care.  ReportedHematemesis:per hergranddaughter, patient had 5 episodes of coffee-ground emesis.Her hemoglobin is stableif not volume concentrated. -Since hemoglobin stable restartedEliquis -protonix -suspect vomiting was from gastroparesis - Zofran IV for nausea - Avoid NSAIDs and SQ heparin -had EGDin 2015  Hypokalemia:  -Replacement ordered.  Continue to monitor and replace as needed.  BDZ:HGDJM pressures are soft -Initially held Bumex due to worsening renal function. -Now  resumed.  Continue to monitor blood pressure closely and adjust medications as needed.  Atrial Fibrillation: CHA2DS2-VASc Scoreis 8, needs oral anticoagulation. Patient is on Eliquis at home. Heart rate is well controlled. -Since hemoglobin stable restarted Eliquis.  History of stroke: -Resumed eliquis. Hgb stable  GERD (gastroesophageal reflux disease): -on protonix  Chronic systolic CHF (congestive heart failure) (HCC):2D echo 07/21/2018 showed EF of 35-40%. Patient does not have leg edema.  -Initially held diuretics (Bumex and metolazone) due to worsening renal function -Now Bumex started.  Heart failure physician following. Appreciate help. Theyrecommend palliative. Palliative care consult requested to discuss goals of care.  Acute renal failure superimposed on stage 3 chronic kidney disease (HCC):Baseline creatinine 1.2-1.3. Her creatininetoday is 1.48.   Fall: CT-head negative foracute intracranial abnormalities. -PT/OT  Hyponatremia:Na 132. Mental status is at her baseline. Likely due to poor oral intake, and continuation of diuretics. -cautious IVF   Family Communication/Anticipated D/C date and plan/Code Status   DVT prophylaxis:eliquis Code Status:Full Code.  Family Communication:Previously discussed with granddaughter Disposition Plan:seems over the last 6 months to have a rapid decline with weight loss and difficult medication management due to poor PO intake.Palliative care consult requested.  Consultants:  Heart failure team, palliative care   Procedures:  None   Antimicrobials:  None  Subjective: No major complaints at this time.  She has shortness of breath on ambulation.  Objective: Vitals:   05/17/19 1321 05/17/19 2140 05/18/19 0515 05/18/19 1516  BP: (!) 89/69 100/79 107/77   Pulse: (!) 58 64 68   Resp: 18 18 20    Temp: 97.6 F (36.4 C) 98.4 F (36.9 C) 97.6 F (36.4 C)   TempSrc: Oral Oral Oral    SpO2: 100% 98% 100% 91%  Weight:   54.9 kg   Height:        Intake/Output  Summary (Last 24 hours) at 05/18/2019 1545 Last data filed at 05/18/2019 0510 Gross per 24 hour  Intake 420 ml  Output 775 ml  Net -355 ml   Filed Weights   05/16/19 0441 05/17/19 0607 05/18/19 0515  Weight: 54.5 kg 51.4 kg 54.9 kg    Examination:  General exam: Appears calm and comfortable  Respiratory system: Decreased breath sounds lower lobes otherwise clear to auscultation. Cardiovascular system: S1 & S2. No murmur. Gastrointestinal system: Abdomen is nondistended, soft and nontender. Normal bowel sounds heard. Central nervous system: Alert and oriented. No focal neurological deficits. Skin: No rashes, lesions or ulcers Psychiatry: Judgement and insight appear normal. Mood & affect appropriate.     Data Reviewed: I have personally reviewed following labs and imaging studies  CBC: Recent Labs  Lab 05/14/19 1615 05/15/19 0516 05/16/19 0417 05/17/19 0408  WBC 3.2* 3.3* 3.5* 3.5*  NEUTROABS 2.2  --   --   --   HGB 15.5* 14.1 13.9 13.0  HCT 46.8* 43.1 42.0 39.8  MCV 91.6 91.9 91.5 91.1  PLT 162 137* 141* 382*   Basic Metabolic Panel: Recent Labs  Lab 05/14/19 2112 05/15/19 0516 05/16/19 0417 05/17/19 0408 05/18/19 0230  NA 130* 131* 130* 132* 132*  K 5.7* 4.4 4.5 3.6 3.2*  CL 98 98 99 98 98  CO2 20* 19* 20* 23 20*  GLUCOSE 112* 85 146* 106* 130*  BUN 60* 55* 49* 46* 41*  CREATININE 1.71* 1.61* 1.59* 1.72* 1.48*  CALCIUM 9.3 8.7* 8.8* 8.6* 8.3*  MG 2.8*  --   --   --   --    GFR: Estimated Creatinine Clearance: 22.6 mL/min (A) (by C-G formula based on SCr of 1.48 mg/dL (H)). Liver Function Tests: Recent Labs  Lab 05/14/19 2112  AST 46*  ALT 25  ALKPHOS 118  BILITOT 3.9*  PROT 8.2*  ALBUMIN 3.6   Recent Labs  Lab 05/14/19 2112  LIPASE 31   No results for input(s): AMMONIA in the last 168 hours. Coagulation Profile: Recent Labs  Lab 05/15/19 0516  INR 1.7*    Cardiac Enzymes: Recent Labs  Lab 05/15/19 0516  CKTOTAL 43   BNP (last 3 results) No results for input(s): PROBNP in the last 8760 hours. HbA1C: No results for input(s): HGBA1C in the last 72 hours. CBG: Recent Labs  Lab 05/16/19 1608 05/16/19 2152 05/17/19 0804 05/18/19 0741 05/18/19 1114  GLUCAP 102* 140* 116* 115* 170*   Lipid Profile: No results for input(s): CHOL, HDL, LDLCALC, TRIG, CHOLHDL, LDLDIRECT in the last 72 hours. Thyroid Function Tests: No results for input(s): TSH, T4TOTAL, FREET4, T3FREE, THYROIDAB in the last 72 hours. Anemia Panel: No results for input(s): VITAMINB12, FOLATE, FERRITIN, TIBC, IRON, RETICCTPCT in the last 72 hours. Sepsis Labs: No results for input(s): PROCALCITON, LATICACIDVEN in the last 168 hours.  Recent Results (from the past 240 hour(s))  SARS CORONAVIRUS 2 (TAT 6-24 HRS) Nasopharyngeal Nasopharyngeal Swab     Status: None   Collection Time: 05/14/19 10:10 PM   Specimen: Nasopharyngeal Swab  Result Value Ref Range Status   SARS Coronavirus 2 NEGATIVE NEGATIVE Final    Comment: (NOTE) SARS-CoV-2 target nucleic acids are NOT DETECTED. The SARS-CoV-2 RNA is generally detectable in upper and lower respiratory specimens during the acute phase of infection. Negative results do not preclude SARS-CoV-2 infection, do not rule out co-infections with other pathogens, and should not be used as the sole basis for treatment or other patient management decisions. Negative results  must be combined with clinical observations, patient history, and epidemiological information. The expected result is Negative. Fact Sheet for Patients: SugarRoll.be Fact Sheet for Healthcare Providers: https://www.woods-mathews.com/ This test is not yet approved or cleared by the [place] FDA and  has been authorized for detection and/or diagnosis of SARS-CoV-2 by FDA under an Emergency Use Authorization (EUA). This  EUA will remain  in effect (meaning this test can be used) for the duration of the COVID-19 declaration under Section 56 4(b)(1) of the Act, 21 U.S.C. section 360bbb-3(b)(1), unless the authorization is terminated or revoked sooner. Performed at [facility], [place] [address] [place], [place] [address]          Radiology Studies: No results found.      Scheduled Meds: . apixaban  2.5 mg Oral BID  . bumetanide  4 mg Oral BID  . dextrose  50 mL Intravenous Once  . feeding supplement (ENSURE ENLIVE)  237 mL Oral BID BM  . latanoprost  1 drop Both Eyes QHS  . loratadine  10 mg Oral Daily  . magnesium oxide  400 mg Oral Daily  . pantoprazole  40 mg Intravenous Q12H  . potassium chloride  40 mEq Oral Once  . [START ON 05/19/2019] senna  1 tablet Oral QHS  . simethicone  80 mg Oral QID  . timolol  1 drop Both Eyes Daily   Continuous Infusions:   LOS: 3 days    Yaakov Guthrie, MD [facility] Pager on [place]  If 7PM-7AM, please contact night-coverage [email or web address] Password [facility] 05/18/2019, 3:45 PM

## 2019-05-18 NOTE — Care Management Important Message (Signed)
Important Message  Patient Details  Name: Kim Mullins MRN: [id number] Date of Birth: 1929-09-02   Medicare Important Message Given:  Yes     Shelda Altes 05/18/2019, 1:21 PM

## 2019-05-18 NOTE — Progress Notes (Signed)
Patient refused CPAP tonight. There Isn't a machine in the room at this time. 

## 2019-05-18 NOTE — Progress Notes (Signed)
Physical Therapy Treatment Patient Details Name: Kim Mullins MRN: [id number] DOB: [birth year] Today's Date: 05/18/2019    History of Present Illness 85yo female presentijng with general weakness, coffee ground emesis. Recent fall. PMH HTN, DM, asthma on home O2, CVA, complete heart block s/p ICD, CHF with EF 35%, CKD stage III, A-fib, hx tibial fx, cardiac cath, THR, total shoulder arthroscopy    PT Comments    Pt sitting EoB eating breakfast on entry agreeable to ambulation with therapy. Pt request to use BSC prior to walking. Pt is min A for transfers and ambulation of 120 feet with RW. Pt on 3L O2 SaO2 95% at rest, possible drop in SaO2 to 86%O2 with ambulation however poor pleth waveform and rebound to 93%O2 almost immediately with sitting in recliner. D/c plans remain appropriate at this time. PT will continue to follow acutely.    Follow Up Recommendations  SNF;Supervision/Assistance - 24 hour(if patient/family refuse SNF, she will require HHPT and 24/7 physical assist/S)     Equipment Recommendations  Rolling walker with 5" wheels;3in1 (PT)       Precautions / Restrictions Precautions Precautions: Fall;ICD/Pacemaker Restrictions Weight Bearing Restrictions: No    Mobility  Bed Mobility Overal bed mobility: Needs Assistance Bed Mobility: Supine to Sit;Sit to Supine     Supine to sit: Min guard Sit to supine: Min guard   General bed mobility comments: sitting EoB eating breakfast on entry  Transfers Overall transfer level: Needs assistance Equipment used: Rolling walker (2 wheeled);1 person hand held assist Transfers: Sit to/from [facility] Sit to Stand: Min assist Stand pivot transfers: Min assist       General transfer comment: minA for HHA for stand pivot transfer bed<>BSC, min A with cuing for proper hand placement for power up to RW  Ambulation/Gait Ambulation/Gait assistance: Min assist   Assistive device: Rolling walker (2  wheeled) Gait Pattern/deviations: Step-through pattern;Decreased step length - right;Decreased step length - left;Decreased dorsiflexion - right;Decreased dorsiflexion - left;Decreased stride length;Drifts right/left;Trunk flexed Gait velocity: slowed Gait velocity interpretation: <1.31 ft/sec, indicative of household ambulator General Gait Details: min A for steadying with RW, vc for upright posture and proximity to RW       Balance Overall balance assessment: Needs assistance;History of Falls Sitting-balance support: Bilateral upper extremity supported;Feet supported Sitting balance-Leahy Scale: Fair     Standing balance support: Bilateral upper extremity supported;During functional activity Standing balance-Leahy Scale: Fair Standing balance comment: able to static stand at [facility] requires UE support for dynamic balance                            Cognition Arousal/Alertness: Awake/alert Behavior During Therapy: Flat affect Overall Cognitive Status: History of cognitive impairments - at baseline Area of Impairment: Memory;Following commands;Safety/judgement;Awareness;Problem solving                     Memory: Decreased recall of precautions;Decreased short-term memory Following Commands: Follows one step commands with increased time;Follows multi-step commands with increased time Safety/Judgement: Decreased awareness of safety;Decreased awareness of deficits Awareness: Emergent Problem Solving: Slow processing;Requires verbal cues;Requires tactile cues        Exercises      General Comments General comments (skin integrity, edema, etc.): prior to ambulation HR 70bpm, BP 103/81, SaO2 on 3L 95%O2, with ambulation HR max 118bpm, poor pleth waveform with ambulation possibly SaO2 drop to 86%O2, recovered to 93%O2 with sitting in recliner, BP after ambulation 117/84  Pertinent Vitals/Pain Pain Assessment: No/denies pain           PT Goals (current  goals can now be found in the care plan section) Acute Rehab PT Goals Patient Stated Goal: to not fall PT Goal Formulation: Patient unable to participate in goal setting Time For Goal Achievement: 05/29/19 Potential to Achieve Goals: Fair    Frequency    Min 2X/week      PT Plan Current plan remains appropriate       AM-PAC PT "6 Clicks" Mobility   Outcome Measure  Help needed turning from your back to your side while in a flat bed without using bedrails?: A Little Help needed moving from lying on your back to sitting on the side of a flat bed without using bedrails?: A Little Help needed moving to and from a bed to a chair (including a wheelchair)?: A Lot Help needed standing up from a chair using your arms (e.g., wheelchair or bedside chair)?: A Lot Help needed to walk in hospital room?: A Lot Help needed climbing 3-5 steps with a railing? : A Lot 6 Click Score: 14    End of Session Equipment Utilized During Treatment: Gait belt;Oxygen Activity Tolerance: Patient tolerated treatment well Patient left: in chair;with call bell/phone within reach Nurse Communication: Mobility status PT Visit Diagnosis: Unsteadiness on feet (R26.81);History of falling (Z91.81);Muscle weakness (generalized) (M62.81);Difficulty in walking, not elsewhere classified (R26.2);Other abnormalities of gait and mobility (R26.89)     Time: 7915-0569 PT Time Calculation (min) (ACUTE ONLY): 23 min  Charges:  $Gait Training: 23-37 mins                     Jarion Hawthorne B. Migdalia Dk PT, DPT Acute Rehabilitation Services Pager [phone] Office [phone]    [place] 05/18/2019, 11:04 AM

## 2019-05-18 NOTE — Progress Notes (Addendum)
Daily Progress Note   Patient Name: Kim Mullins       Date: 05/18/2019 DOB: 1930/06/02  Age: 85 y.o. MRN#: [id number] Attending Physician: Yaakov Guthrie, MD Primary Care Physician: Lin Landsman, MD Admit Date: 05/14/2019  Reason for Consultation/Follow-up: Establishing goals of care  Subjective: Chart reviewed.  Patient walked 120' with PT.  I listened to the HF Attending as he spoke with the patient and explained their plan of care.  There are significant limitations to their ability to treat her cardiac amyloid because of her other significant co-morbidities.  Patient's grand daughter presented me with her previous MOST form which states she is full code and wants all interventions.  I reviewed the concept of code status with Ms. Coviello and explained that if she truly arrested she would have an estimated 2% of resuscitation and survival to discharge after a code.  She would have a 98% chance of being traumatized in the last moments of her life.   She still wanted to remain a full code.  In order to check her understanding I asked her what would happen if she needed to be coded.  She responded that the people would come and her grand daughter would make them do everything on the pink form.  I went 1 more step and asked if she was resuscitated and placed on life support and was not improving - how long would she want to remain on life support.  She replied "for as long as it takes".       I don't feel the patient understands the implications of her decision to be a full code.   I am confident that the grand daughter's intention is to take charge of her grandmother's care decisions when she feels it is necessary.  [name] brought a new HCPOA form to the hospital and asked if a notary  could come notarize her grandmother's signature.  Assessment: Patient chipper and stable.  Had multiple bowel movements.   Length of Stay: 3  Current Medications: Scheduled Meds:  . apixaban  2.5 mg Oral BID  . bumetanide  4 mg Oral BID  . dextrose  50 mL Intravenous Once  . feeding supplement (ENSURE ENLIVE)  237 mL Oral BID BM  . latanoprost  1 drop Both Eyes QHS  . loratadine  10  mg Oral Daily  . magnesium oxide  400 mg Oral Daily  . pantoprazole  40 mg Intravenous Q12H  . potassium chloride  40 mEq Oral Once  . senna  2 tablet Oral QHS  . simethicone  80 mg Oral QID  . timolol  1 drop Both Eyes Daily    Continuous Infusions:   PRN Meds: acetaminophen **OR** acetaminophen, albuterol, bisacodyl, hydrALAZINE, ondansetron **OR** ondansetron (ZOFRAN) IV  Physical Exam        Thin frail, alert, orientated, cooperative.  Appears bright and alert.  Eating full liquid lunch well   Vital Signs: BP 107/77 (BP Location: Left Arm)   Pulse 68   Temp 97.6 F (36.4 C) (Oral)   Resp 20   Ht 5\' 3"  (1.6 m)   Wt 54.9 kg   SpO2 100%   BMI 21.44 kg/m  SpO2: SpO2: 100 % O2 Device: O2 Device: Nasal Cannula O2 Flow Rate: O2 Flow Rate (L/min): 2 L/min  Intake/output summary:   Intake/Output Summary (Last 24 hours) at 05/18/2019 1318 Last data filed at 05/18/2019 0510 Gross per 24 hour  Intake 420 ml  Output 775 ml  Net -355 ml   LBM: Last BM Date: 05/17/19 Baseline Weight: Weight: 54.5 kg Most recent weight: Weight: 54.9 kg       Palliative Assessment/Data:  50%      Patient Active Problem List   Diagnosis Date Noted  . 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 FTT (failure to thrive) in adult 05/15/2019  . Hematemesis 05/14/2019  . Chronic systolic CHF (congestive heart failure) ([place]) 05/14/2019  . Acute renal failure superimposed on stage 3 chronic kidney disease ([place]) 05/14/2019  . Fall 05/14/2019  . Hyponatremia 05/14/2019  . Hyperkalemia 05/14/2019  . Obesity (BMI  30-39.9) 10/20/2018  . Acute on chronic heart failure ([place]) 08/19/2018  . Palliative care encounter   . Palliative care by specialist   . Acute respiratory failure with hypoxia ([place]) 08/18/2018  . Diabetes ([place]) 08/18/2018  . CKD (chronic kidney disease) 08/18/2018  . Amyloidosis ([place]) 08/18/2018  . Acute on chronic combined systolic and diastolic heart failure ([place]) 08/17/2018  . GERD (gastroesophageal reflux disease) 08/17/2018  . Elevated troponin 08/17/2018  . Glaucoma 08/17/2018  . Hyperbilirubinemia 08/17/2018  . CHF (congestive heart failure) ([place]) 07/18/2018  . Gait abnormality 05/03/2017  . Hypotension 04/14/2016  . Hypotension due to drugs   . Chronic anticoagulation   . TIA (transient ischemic attack) 04/12/2016  . UTI (lower urinary tract infection) 04/12/2016  . AKI (acute kidney injury) ([place]) 04/12/2016  . Atrial fibrillation ([place]) 09/12/2015  . Occipital infarction ([place])   . History of stroke 06/18/2015  . Homonymous hemianopsia   . Pacemaker 01/21/2015  . OSA (obstructive sleep apnea) 10/23/2014  . Dizziness 10/21/2014  . Complete heart block ([place]) 10/06/2014  . Benign essential HTN 10/04/2014  . Acute on chronic systolic CHF (congestive heart failure) ([place]) 10/04/2014  . Abnormal cardiovascular stress test 08/27/2014  . Pulmonary HTN ([place]) 08/16/2014  . DCM (dilated cardiomyopathy) ([place]) 08/16/2014  . Cough 07/13/2014  . Esophageal stricture 06/11/2014  . Nonspecific (abnormal) findings on radiological and other examination of gastrointestinal tract 05/29/2014  . Dyspnea 04/29/2014  . Osteoarthritis of right shoulder region 06/26/2013  . DJD (degenerative joint disease) of hip 11/16/2011    Class: Present on Admission    Palliative Care Plan    Recommendations/Plan:  Advanced diet to dysphagia 3  Will decrease senna to 1 tab nightly  (no need  to give any tonight)  Added magnesium to keep her bowels regular and attempt to support her potassium  level.  Patient is full code, full scope treatment.    [place] daughter is HCPOA  Please discharge to SNF with Palliative to follow.  No further inpatient Palliative needs unless her condition changes.  Please call us if needed  Goals of Care and Additional Recommendations:  Limitations on Scope of Treatment: Full Scope Treatment  Code Status:  Full code  Prognosis:   Unable to determine  Likely less than 1 year given advanced heart disease and cardiac amyloid.   Discharge Planning:  [facility] for rehab with Palliative care service follow-up  Care plan was discussed with patient and grand daughter.  Thank you for allowing the Palliative Medicine Team to assist in the care of this patient.  Total time spent:  35 min.     Greater than 50%  of this time was spent counseling and coordinating care related to the above assessment and plan.  Florentina Jenny, PA-C Palliative Medicine  Please contact Palliative MedicineTeam phone at [phone] for questions and concerns between 7 am - 7 pm.   Please see AMION for individual provider pager numbers.

## 2019-05-18 NOTE — Progress Notes (Addendum)
Patient ID: Kim Mullins, female   DOB: 1930/03/04, 85 y.o.   MRN: [id number]     Advanced Heart Failure Rounding Note  PCP-Cardiologist: No primary care provider on file.   Subjective:    Stable, denies shortness of breath unless she walks around.    Objective:   Weight Range: 54.9 kg Body mass index is 21.44 kg/m.   Vital Signs:   Temp:  [97.6 F (36.4 C)-98.4 F (36.9 C)] 97.6 F (36.4 C) (09/18 0515) Pulse Rate:  [58-68] 68 (09/18 0515) Resp:  [18-20] 20 (09/18 0515) BP: (89-107)/(69-79) 107/77 (09/18 0515) SpO2:  [98 %-100 %] 100 % (09/18 0515) Weight:  [54.9 kg] 54.9 kg (09/18 0515) Last BM Date: 05/17/19  Weight change: Filed Weights   05/16/19 0441 05/17/19 0607 05/18/19 0515  Weight: 54.5 kg 51.4 kg 54.9 kg    Intake/Output:   Intake/Output Summary (Last 24 hours) at 05/18/2019 1319 Last data filed at 05/18/2019 0510 Gross per 24 hour  Intake 420 ml  Output 775 ml  Net -355 ml      Physical Exam    General:  Thin. Sitting on the side of the bed. No resp difficulty HEENT: normal Neck: supple. JVP 8 cm  Carotids 2+ bilat; no bruits. No lymphadenopathy or thryomegaly appreciated. Cor: PMI nondisplaced. Regular rate & rhythm. No rubs, gallops or murmurs. Lungs: clear 2 liters oxygen Abdomen: soft, nontender, nondistended. No hepatosplenomegaly. No bruits or masses. Good bowel sounds. Extremities: no cyanosis, clubbing, rash, edema Neuro: alert & orientedx3, cranial nerves grossly intact. moves all 4 extremities w/o difficulty. Affect pleasant   Telemetry   NSR and BiV Paced 60s   EKG   n/a   Labs    CBC Recent Labs    05/16/19 0417 05/17/19 0408  WBC 3.5* 3.5*  HGB 13.9 13.0  HCT 42.0 39.8  MCV 91.5 91.1  PLT 141* 628*   Basic Metabolic Panel Recent Labs    05/17/19 0408 05/18/19 0230  NA 132* 132*  K 3.6 3.2*  CL 98 98  CO2 23 20*  GLUCOSE 106* 130*  BUN 46* 41*  CREATININE 1.72* 1.48*  CALCIUM 8.6* 8.3*   Liver  Function Tests No results for input(s): AST, ALT, ALKPHOS, BILITOT, PROT, ALBUMIN in the last 72 hours. No results for input(s): LIPASE, AMYLASE in the last 72 hours. Cardiac Enzymes No results for input(s): CKTOTAL, CKMB, CKMBINDEX, TROPONINI in the last 72 hours.  BNP: BNP (last 3 results) Recent Labs    08/17/18 1332 10/09/18 1238 05/15/19 0516  BNP 2,006.0* 1,424.7* 1,217.6*    ProBNP (last 3 results) No results for input(s): PROBNP in the last 8760 hours.   D-Dimer No results for input(s): DDIMER in the last 72 hours. Hemoglobin A1C No results for input(s): HGBA1C in the last 72 hours. Fasting Lipid Panel No results for input(s): CHOL, HDL, LDLCALC, TRIG, CHOLHDL, LDLDIRECT in the last 72 hours. Thyroid Function Tests No results for input(s): TSH, T4TOTAL, T3FREE, THYROIDAB in the last 72 hours.  Invalid input(s): FREET3  Other results:   Imaging    No results found.   Medications:     Scheduled Medications: . apixaban  2.5 mg Oral BID  . bumetanide  4 mg Oral BID  . dextrose  50 mL Intravenous Once  . feeding supplement (ENSURE ENLIVE)  237 mL Oral BID BM  . latanoprost  1 drop Both Eyes QHS  . loratadine  10 mg Oral Daily  . magnesium oxide  400  mg Oral Daily  . pantoprazole  40 mg Intravenous Q12H  . potassium chloride  40 mEq Oral Once  . senna  2 tablet Oral QHS  . simethicone  80 mg Oral QID  . timolol  1 drop Both Eyes Daily    Infusions:   PRN Medications: acetaminophen **OR** acetaminophen, albuterol, bisacodyl, hydrALAZINE, ondansetron **OR** ondansetron (ZOFRAN) IV    Assessment/Plan  1. Hematemesis:  Resolved. Hgb stable. Back on Eliquis. No bleeding. PPI ordered 2. Hyperkalemia: Treated by primary team. Resolved, now hypokalemic.  3. Failure to Thrive secondary to End Stage HF 4. Chronic systolic CHF: Nonischemic cardiomyopathy. MDT CRT-P device. ECHO 07/21/2018 with EF 35-40%, severe TR. PYP scan in 10/19 was highly  suggestive of TTR amyloidosis. Patient has had progressive worsening of dyspnea and volume overload. This is complicated by CKD stage IV. Volume status stable currently.   - Continue  bumex 4 mg twice a day.  Hold metolazone.   - No ACEI/ARB/spironolactone/ARNI/beta blocker with CKD stage IV and marginal BP.  5. 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 I think that with advanced age and advanced HF, she is unlikely to benefit significantly from tafamidis. 6. Atrial fibrillation: Paroxysmal. Maintaining NSR.  -Continue  Eliquis 2.5 mg twice a day.    7. CKD Stage IV: Creatinine baseline 1.5- 2. Creatinine  1.4 today . Stable.  8. CHB: [employer] CRT-P.  9. Chronic Hypoxic Respiratory Failure: continue 2 liters oxygen   Palliative care following, would likely benefit from hospice services.   Cardiology will see again on Monday unless called.   Length of Stay: 3  [name], MD  05/18/2019, 1:19 PM  Advanced Heart Failure Team Pager [phone] (M-F; 7a - 4p)  Please contact [facility] for night-coverage after hours (4p -7a ) and weekends on [email or web address]

## 2019-05-19 DIAGNOSIS — N170 Acute kidney failure with tubular necrosis: Secondary | ICD-10-CM

## 2019-05-19 DIAGNOSIS — I5023 Acute on chronic systolic (congestive) heart failure: Secondary | ICD-10-CM

## 2019-05-19 DIAGNOSIS — N183 Chronic kidney disease, stage 3 (moderate): Secondary | ICD-10-CM

## 2019-05-19 LAB — BASIC METABOLIC PANEL
Anion gap: 16 — ABNORMAL HIGH (ref 5–15)
BUN: 38 mg/dL — ABNORMAL HIGH (ref 8–23)
CO2: 17 mmol/L — ABNORMAL LOW (ref 22–32)
Calcium: 8.4 mg/dL — ABNORMAL LOW (ref 8.9–10.3)
Chloride: 98 mmol/L (ref 98–111)
Creatinine, Ser: 1.4 mg/dL — ABNORMAL HIGH (ref 0.44–1.00)
GFR calc Af Amer: 39 mL/min — ABNORMAL LOW (ref >60)
GFR calc non Af Amer: 34 mL/min — ABNORMAL LOW (ref >60)
Glucose, Bld: 110 mg/dL — ABNORMAL HIGH (ref 70–99)
Potassium: 4.6 mmol/L (ref 3.5–5.1)
Sodium: 131 mmol/L — ABNORMAL LOW (ref 135–145)

## 2019-05-19 LAB — CBC
HCT: 41.7 % (ref 36.0–46.0)
Hemoglobin: 13.6 g/dL (ref 12.0–15.0)
MCH: 29.2 pg (ref 26.0–34.0)
MCHC: 32.6 g/dL (ref 30.0–36.0)
MCV: 89.5 fL (ref 80.0–100.0)
Platelets: 143 10*3/uL — ABNORMAL LOW (ref 150–400)
RBC: 4.66 MIL/uL (ref 3.87–5.11)
RDW: 17.1 % — ABNORMAL HIGH (ref 11.5–15.5)
WBC: 3.3 10*3/uL — ABNORMAL LOW (ref 4.0–10.5)
nRBC: 0 % (ref 0.0–0.2)

## 2019-05-19 NOTE — Progress Notes (Signed)
PROGRESS NOTE    Kim Mullins  QPY:[id number] DOB: 05-06-1930 DOA: 05/14/2019 PCP: Kim Landsman, MD    Brief Narrative:  Kim Delagarza Wilsonis an 85 y.o.femalewith medical history significant ofhypertension, diet-controlled diabetes, asthma, on 2 to 3 L nasal cannula oxygen at home, stroke, GERD, OSA on CPAP, complete heart block,s/p ofpacemaker placement, CHF with EF 35%, CKD-III, who presentedwith generalized weaknessanddarkemesis. Patient has generalized weakness.She also has decreased appetite and poor oral intake recently.  05/19/2019: Patient seen alongside patient's granddaughter.  Available records reviewed.  Patient stable for discharge once skilled nursing facility bed is arranged.  No new complaints.  No shortness of breath.   Assessment & Plan:   Principal Problem:   Hematemesis Active Problems:   Benign essential HTN   Pacemaker   History of stroke   Atrial fibrillation (HCC)   GERD (gastroesophageal reflux disease)   Chronic systolic CHF (congestive heart failure) (HCC)   Acute renal failure superimposed on stage 3 chronic kidney disease (HCC)   Fall   Hyponatremia   Hyperkalemia   FTT (failure to thrive) in adult   Cardiac amyloidosis (HCC)   FTT/contsitpation/hypoglycemia -per granddaughter does not eat much -blood sugarswas low previously. Now improving. Continue Accu-Cheks. Per granddaughter in February palliativecare saw patient at home but she never heard back from them or hospice Per Dr. Mclean:Poor prognosis with advanced HF in setting of nonischemic cardiomyopathy/cardiac amyloidosis. -Consulted palliative care to discuss goals of care. 05/19/2019: Stable.  ReportedHematemesis:per hergranddaughter, patient had 5 episodes of coffee-ground emesis.Her hemoglobin is stableif not volume concentrated. -Since hemoglobin stable restartedEliquis -protonix -suspect vomiting was from gastroparesis - Zofran IV for nausea -  Avoid NSAIDs and SQ heparin -had EGDin 2015 05/19/2019: No new complaints.  No hematemesis.  Hypokalemia:  -Replacement ordered.  Continue to monitor and replace as needed. 05/19/2019: Resolved.  Potassium was 4.6 earlier today.  TIW:PYKDX pressures are soft -Initially held Bumex due to worsening renal function. -Now resumed.  Continue to monitor blood pressure closely and adjust medications as needed.  Atrial Fibrillation: CHA2DS2-VASc Scoreis 8, needs oral anticoagulation. Patient is on Eliquis at home. Heart rate is well controlled. -Since hemoglobin stable restarted Eliquis.  History of stroke: -Resumed eliquis. Hgb stable  GERD (gastroesophageal reflux disease): -on protonix  Chronic systolic CHF (congestive heart failure) (HCC):2D echo 07/21/2018 showed EF of 35-40%. Patient does not have leg edema.  -Initially held diuretics (Bumex and metolazone) due to worsening renal function -Now Bumex started.  Heart failure physician following. Appreciate help. Theyrecommend palliative. Palliative care consult requested to discuss goals of care. 05/19/2019: Stable.  Acute renal failure superimposed on stage 3 chronic kidney disease (HCC):Baseline creatinine 1.2-1.3. Her creatininetoday is 1.48. 05/18/2019: Serum creatinine is at baseline.   Fall: CT-head negative foracute intracranial abnormalities. -PT/OT  Hyponatremia:Na 132. Mental status is at her baseline. Likely due to poor oral intake, and continuation of diuretics. -cautious IVF 05/19/2019: Hyponatremia persists.  Sodium is 131 today.  Hyponatremia is likely secondary to congestive heart failure.  Hyponatremia, in this setting, likely has prognostic significance.   Family Communication/Anticipated D/C date and plan/Code Status   DVT prophylaxis:eliquis Code Status:Full Code.  Family Communication:Previously discussed with granddaughter Disposition Plan:seems over the last 6 months to have  a rapid decline with weight loss and difficult medication management due to poor PO intake.Palliative care consult requested. 05/19/2019: Awaiting skilled nursing facility bed.  Consultants:  Heart failure team, palliative care   Procedures:  None   Antimicrobials:  None  Subjective: No shortness  of breath. No chest pain. No fever or chills.  Objective: Vitals:   05/18/19 2011 05/18/19 2012 05/19/19 0532 05/19/19 0940  BP: 95/75  92/73 94/69  Pulse: 61  (!) 58 61  Resp: 19  19 (!) 23  Temp:  97.6 F (36.4 C) (!) 97.5 F (36.4 C) 97.6 F (36.4 C)  TempSrc:  Oral Oral Oral  SpO2: 100%  98% 100%  Weight:   56 kg   Height:        Intake/Output Summary (Last 24 hours) at 05/19/2019 1032 Last data filed at 05/19/2019 0900 Gross per 24 hour  Intake 480 ml  Output -  Net 480 ml   Filed Weights   05/17/19 0607 05/18/19 0515 05/19/19 0532  Weight: 51.4 kg 54.9 kg 56 kg    Examination:  General exam: Appears calm and comfortable  Respiratory system: Clear to auscultation. Cardiovascular system: S1 & S2. No murmur. Gastrointestinal system: Abdomen is nondistended, soft and nontender. Normal bowel sounds heard. Central nervous system: Awake and alert.  Patient moves all extremities.  Patient may have some cognitive deficit.  Data Reviewed: I have personally reviewed following labs and imaging studies  CBC: Recent Labs  Lab 05/14/19 1615 05/15/19 0516 05/16/19 0417 05/17/19 0408 05/19/19 0437  WBC 3.2* 3.3* 3.5* 3.5* 3.3*  NEUTROABS 2.2  --   --   --   --   HGB 15.5* 14.1 13.9 13.0 13.6  HCT 46.8* 43.1 42.0 39.8 41.7  MCV 91.6 91.9 91.5 91.1 89.5  PLT 162 137* 141* 141* 628*   Basic Metabolic Panel: Recent Labs  Lab 05/14/19 2112 05/15/19 0516 05/16/19 0417 05/17/19 0408 05/18/19 0230 05/19/19 0437  NA 130* 131* 130* 132* 132* 131*  K 5.7* 4.4 4.5 3.6 3.2* 4.6  CL 98 98 99 98 98 98  CO2 20* 19* 20* 23 20* 17*  GLUCOSE 112* 85 146* 106*  130* 110*  BUN 60* 55* 49* 46* 41* 38*  CREATININE 1.71* 1.61* 1.59* 1.72* 1.48* 1.40*  CALCIUM 9.3 8.7* 8.8* 8.6* 8.3* 8.4*  MG 2.8*  --   --   --   --   --    GFR: Estimated Creatinine Clearance: 23.9 mL/min (A) (by C-G formula based on SCr of 1.4 mg/dL (H)). Liver Function Tests: Recent Labs  Lab 05/14/19 2112  AST 46*  ALT 25  ALKPHOS 118  BILITOT 3.9*  PROT 8.2*  ALBUMIN 3.6   Recent Labs  Lab 05/14/19 2112  LIPASE 31   No results for input(s): AMMONIA in the last 168 hours. Coagulation Profile: Recent Labs  Lab 05/15/19 0516  INR 1.7*   Cardiac Enzymes: Recent Labs  Lab 05/15/19 0516  CKTOTAL 43   BNP (last 3 results) No results for input(s): PROBNP in the last 8760 hours. HbA1C: No results for input(s): HGBA1C in the last 72 hours. CBG: Recent Labs  Lab 05/16/19 2152 05/17/19 0804 05/18/19 0741 05/18/19 1114 05/18/19 1639  GLUCAP 140* 116* 115* 170* 110*   Lipid Profile: No results for input(s): CHOL, HDL, LDLCALC, TRIG, CHOLHDL, LDLDIRECT in the last 72 hours. Thyroid Function Tests: No results for input(s): TSH, T4TOTAL, FREET4, T3FREE, THYROIDAB in the last 72 hours. Anemia Panel: No results for input(s): VITAMINB12, FOLATE, FERRITIN, TIBC, IRON, RETICCTPCT in the last 72 hours. Sepsis Labs: No results for input(s): PROCALCITON, LATICACIDVEN in the last 168 hours.  Recent Results (from the past 240 hour(s))  SARS CORONAVIRUS 2 (TAT 6-24 HRS) Nasopharyngeal Nasopharyngeal Swab  Status: None   Collection Time: 05/14/19 10:10 PM   Specimen: Nasopharyngeal Swab  Result Value Ref Range Status   SARS Coronavirus 2 NEGATIVE NEGATIVE Final    Comment: (NOTE) SARS-CoV-2 target nucleic acids are NOT DETECTED. The SARS-CoV-2 RNA is generally detectable in upper and lower respiratory specimens during the acute phase of infection. Negative results do not preclude SARS-CoV-2 infection, do not rule out co-infections with other pathogens, and should  not be used as the sole basis for treatment or other patient management decisions. Negative results must be combined with clinical observations, patient history, and epidemiological information. The expected result is Negative. Fact Sheet for Patients: SugarRoll.be Fact Sheet for Healthcare Providers: https://www.woods-mathews.com/ This test is not yet approved or cleared by the [place] FDA and  has been authorized for detection and/or diagnosis of SARS-CoV-2 by FDA under an Emergency Use Authorization (EUA). This EUA will remain  in effect (meaning this test can be used) for the duration of the COVID-19 declaration under Section 56 4(b)(1) of the Act, 21 U.S.C. section 360bbb-3(b)(1), unless the authorization is terminated or revoked sooner. Performed at [facility], [place] [address], [place], [place] [address]          Radiology Studies: No results found.      Scheduled Meds: . apixaban  2.5 mg Oral BID  . bumetanide  4 mg Oral BID  . dextrose  50 mL Intravenous Once  . feeding supplement (ENSURE ENLIVE)  237 mL Oral BID BM  . latanoprost  1 drop Both Eyes QHS  . loratadine  10 mg Oral Daily  . magnesium oxide  400 mg Oral Daily  . pantoprazole  40 mg Intravenous Q12H  . senna  1 tablet Oral QHS  . simethicone  80 mg Oral QID  . timolol  1 drop Both Eyes Daily   Continuous Infusions:   LOS: 4 days    Bonnell Public, MD [facility] Pager on amion  If 7PM-7AM, please contact night-coverage [email or web address] Password [facility] 05/19/2019, 10:32 AM

## 2019-05-20 LAB — GLUCOSE, CAPILLARY: Glucose-Capillary: 149 mg/dL — ABNORMAL HIGH (ref 70–99)

## 2019-05-20 MED ORDER — PANTOPRAZOLE SODIUM 40 MG PO TBEC
40.0000 mg | DELAYED_RELEASE_TABLET | Freq: Two times a day (BID) | ORAL | Status: DC
  Administered 2019-05-20 – 2019-05-25 (×10): 40 mg via ORAL
  Filled 2019-05-20 (×10): qty 1

## 2019-05-20 MED ORDER — DICLOFENAC SODIUM 1 % TD GEL
2.0000 g | Freq: Four times a day (QID) | TRANSDERMAL | Status: DC | PRN
  Administered 2019-05-20 – 2019-05-22 (×3): 2 g via TOPICAL
  Filled 2019-05-20: qty 100

## 2019-05-20 NOTE — Progress Notes (Signed)
Patient refused CPAP for the night  

## 2019-05-20 NOTE — Progress Notes (Signed)
PROGRESS NOTE    Kim Mullins  QPY:[id number] DOB: Apr 29, 1930 DOA: 05/14/2019 PCP: Lin Landsman, MD    Brief Narrative:  Kim Cogle Wilsonis an 85 y.o.femalewith medical history significant ofhypertension, diet-controlled diabetes, asthma, on 2 to 3 L nasal cannula oxygen at home, stroke, GERD, OSA on CPAP, complete heart block,s/p ofpacemaker placement, CHF with EF 35%, CKD-III, who presentedwith generalized weaknessanddarkemesis. Patient has generalized weakness.She also has decreased appetite and poor oral intake recently.  05/19/2019: Patient seen alongside patient's granddaughter.  Available records reviewed.  Patient stable for discharge once skilled nursing facility bed is arranged.  No new complaints.  No shortness of breath.  05/20/2019: Patient seen.  No new changes.  Patient is awaiting disposition.   Assessment & Plan:   Principal Problem:   Hematemesis Active Problems:   Benign essential HTN   Pacemaker   History of stroke   Atrial fibrillation (HCC)   GERD (gastroesophageal reflux disease)   Chronic systolic CHF (congestive heart failure) (HCC)   Acute renal failure superimposed on stage 3 chronic kidney disease (HCC)   Fall   Hyponatremia   Hyperkalemia   FTT (failure to thrive) in adult   Cardiac amyloidosis (HCC)   FTT/contsitpation/hypoglycemia -per granddaughter does not eat much -blood sugarswas low previously. Now improving. Continue Accu-Cheks. Per granddaughter in February palliativecare saw patient at home but she never heard back from them or hospice Per Dr. Mclean:Poor prognosis with advanced HF in setting of nonischemic cardiomyopathy/cardiac amyloidosis. -Consulted palliative care to discuss goals of care. 05/20/2019: Stable.  ReportedHematemesis:per hergranddaughter, patient had 5 episodes of coffee-ground emesis.Her hemoglobin is stableif not volume concentrated. -Since hemoglobin stable restartedEliquis  -protonix -suspect vomiting was from gastroparesis - Zofran IV for nausea - Avoid NSAIDs and SQ heparin -had EGDin 2015 05/20/2019: No new complaints.  No hematemesis.  Hypokalemia:  -Replacement ordered.  Continue to monitor and replace as needed. 05/20/2019: Resolved.  Potassium was 4.6 earlier today.  TIW:PYKDX pressures are soft -Initially held Bumex due to worsening renal function. -Now resumed.  Continue to monitor blood pressure closely and adjust medications as needed.  Atrial Fibrillation: CHA2DS2-VASc Scoreis 8, needs oral anticoagulation. Patient is on Eliquis at home. Heart rate is well controlled. -Since hemoglobin stable restarted Eliquis.  History of stroke: -Resumed eliquis. Hgb stable  GERD (gastroesophageal reflux disease): -on protonix  Chronic systolic CHF (congestive heart failure) (HCC):2D echo 07/21/2018 showed EF of 35-40%. Patient does not have leg edema.  -Initially held diuretics (Bumex and metolazone) due to worsening renal function -Now Bumex started.  Heart failure physician following. Appreciate help. Theyrecommend palliative. Palliative care consult requested to discuss goals of care. 05/20/2019: Stable.  Acute renal failure superimposed on stage 3 chronic kidney disease (HCC):Baseline creatinine 1.2-1.3. Her creatininetoday is 1.48. 05/18/2019: Serum creatinine is at baseline.   Fall: CT-head negative foracute intracranial abnormalities. -PT/OT  Hyponatremia:Na 132. Mental status is at her baseline. Likely due to poor oral intake, and continuation of diuretics. -cautious IVF 05/20/2019: Hyponatremia persists.  Sodium is 131 today.  Hyponatremia is likely secondary to congestive heart failure.  Hyponatremia, in this setting, likely has prognostic significance.   Family Communication/Anticipated D/C date and plan/Code Status   DVT prophylaxis:eliquis Code Status:Full Code.  Family Communication:Previously  discussed with granddaughter Disposition Plan:seems over the last 6 months to have a rapid decline with weight loss and difficult medication management due to poor PO intake.Palliative care consult requested. 05/19/2019: Awaiting skilled nursing facility bed.  Consultants:  Heart failure team, palliative care  Procedures:  None   Antimicrobials:  None  Subjective: No shortness of breath. No chest pain. No fever or chills.  Objective: Vitals:   05/19/19 2007 05/19/19 2010 05/20/19 0452 05/20/19 0914  BP: 99/74  97/75 97/75  Pulse: 60 63 90 (!) 59  Resp: (!) 26 15 18    Temp: (!) 97.5 F (36.4 C)  98.2 F (36.8 C) (!) 97.5 F (36.4 C)  TempSrc: Oral  Axillary Oral  SpO2: 100% 100% 99% 100%  Weight:   56.2 kg   Height:        Intake/Output Summary (Last 24 hours) at 05/20/2019 1555 Last data filed at 05/20/2019 1049 Gross per 24 hour  Intake 840 ml  Output 500 ml  Net 340 ml   Filed Weights   05/18/19 0515 05/19/19 0532 05/20/19 0452  Weight: 54.9 kg 56 kg 56.2 kg    Examination:  General exam: Appears calm and comfortable  Respiratory system: Clear to auscultation. Cardiovascular system: S1 & S2. No murmur. Gastrointestinal system: Abdomen is nondistended, soft and nontender. Normal bowel sounds heard. Central nervous system: Awake and alert.  Patient moves all extremities.  Patient may have some cognitive deficit.  Data Reviewed: I have personally reviewed following labs and imaging studies  CBC: Recent Labs  Lab 05/14/19 1615 05/15/19 0516 05/16/19 0417 05/17/19 0408 05/19/19 0437  WBC 3.2* 3.3* 3.5* 3.5* 3.3*  NEUTROABS 2.2  --   --   --   --   HGB 15.5* 14.1 13.9 13.0 13.6  HCT 46.8* 43.1 42.0 39.8 41.7  MCV 91.6 91.9 91.5 91.1 89.5  PLT 162 137* 141* 141* 485*   Basic Metabolic Panel: Recent Labs  Lab 05/14/19 2112 05/15/19 0516 05/16/19 0417 05/17/19 0408 05/18/19 0230 05/19/19 0437  NA 130* 131* 130* 132* 132* 131*  K  5.7* 4.4 4.5 3.6 3.2* 4.6  CL 98 98 99 98 98 98  CO2 20* 19* 20* 23 20* 17*  GLUCOSE 112* 85 146* 106* 130* 110*  BUN 60* 55* 49* 46* 41* 38*  CREATININE 1.71* 1.61* 1.59* 1.72* 1.48* 1.40*  CALCIUM 9.3 8.7* 8.8* 8.6* 8.3* 8.4*  MG 2.8*  --   --   --   --   --    GFR: Estimated Creatinine Clearance: 23.9 mL/min (A) (by C-G formula based on SCr of 1.4 mg/dL (H)). Liver Function Tests: Recent Labs  Lab 05/14/19 2112  AST 46*  ALT 25  ALKPHOS 118  BILITOT 3.9*  PROT 8.2*  ALBUMIN 3.6   Recent Labs  Lab 05/14/19 2112  LIPASE 31   No results for input(s): AMMONIA in the last 168 hours. Coagulation Profile: Recent Labs  Lab 05/15/19 0516  INR 1.7*   Cardiac Enzymes: Recent Labs  Lab 05/15/19 0516  CKTOTAL 43   BNP (last 3 results) No results for input(s): PROBNP in the last 8760 hours. HbA1C: No results for input(s): HGBA1C in the last 72 hours. CBG: Recent Labs  Lab 05/16/19 2152 05/17/19 0804 05/18/19 0741 05/18/19 1114 05/18/19 1639  GLUCAP 140* 116* 115* 170* 110*   Lipid Profile: No results for input(s): CHOL, HDL, LDLCALC, TRIG, CHOLHDL, LDLDIRECT in the last 72 hours. Thyroid Function Tests: No results for input(s): TSH, T4TOTAL, FREET4, T3FREE, THYROIDAB in the last 72 hours. Anemia Panel: No results for input(s): VITAMINB12, FOLATE, FERRITIN, TIBC, IRON, RETICCTPCT in the last 72 hours. Sepsis Labs: No results for input(s): PROCALCITON, LATICACIDVEN in the last 168 hours.  Recent Results (from the past 240  hour(s))  SARS CORONAVIRUS 2 (TAT 6-24 HRS) Nasopharyngeal Nasopharyngeal Swab     Status: None   Collection Time: 05/14/19 10:10 PM   Specimen: Nasopharyngeal Swab  Result Value Ref Range Status   SARS Coronavirus 2 NEGATIVE NEGATIVE Final    Comment: (NOTE) SARS-CoV-2 target nucleic acids are NOT DETECTED. The SARS-CoV-2 RNA is generally detectable in upper and lower respiratory specimens during the acute phase of infection. Negative  results do not preclude SARS-CoV-2 infection, do not rule out co-infections with other pathogens, and should not be used as the sole basis for treatment or other patient management decisions. Negative results must be combined with clinical observations, patient history, and epidemiological information. The expected result is Negative. Fact Sheet for Patients: SugarRoll.be Fact Sheet for Healthcare Providers: https://www.woods-mathews.com/ This test is not yet approved or cleared by the [place] FDA and  has been authorized for detection and/or diagnosis of SARS-CoV-2 by FDA under an Emergency Use Authorization (EUA). This EUA will remain  in effect (meaning this test can be used) for the duration of the COVID-19 declaration under Section 56 4(b)(1) of the Act, 21 U.S.C. section 360bbb-3(b)(1), unless the authorization is terminated or revoked sooner. Performed at [facility], [place] [address], [place], [place] [address]          Radiology Studies: No results found.      Scheduled Meds: . apixaban  2.5 mg Oral BID  . bumetanide  4 mg Oral BID  . dextrose  50 mL Intravenous Once  . feeding supplement (ENSURE ENLIVE)  237 mL Oral BID BM  . latanoprost  1 drop Both Eyes QHS  . loratadine  10 mg Oral Daily  . magnesium oxide  400 mg Oral Daily  . pantoprazole  40 mg Oral BID  . senna  1 tablet Oral QHS  . simethicone  80 mg Oral QID  . timolol  1 drop Both Eyes Daily   Continuous Infusions:   LOS: 5 days    Bonnell Public, MD [facility] Pager on amion  If 7PM-7AM, please contact night-coverage [email or web address] Password [facility] 05/20/2019, 3:55 PM

## 2019-05-21 LAB — GLUCOSE, CAPILLARY
Glucose-Capillary: 109 mg/dL — ABNORMAL HIGH (ref 70–99)
Glucose-Capillary: 168 mg/dL — ABNORMAL HIGH (ref 70–99)
Glucose-Capillary: 172 mg/dL — ABNORMAL HIGH (ref 70–99)
Glucose-Capillary: 192 mg/dL — ABNORMAL HIGH (ref 70–99)

## 2019-05-21 NOTE — Progress Notes (Signed)
Responded to Spiritual Consult to have HCPOA notarized.  Found paperwork on beside table and asked pt if she was ready to sign and understood what she would be signing..  Pt seemed confused about signing the document saying her granddaughter needed to sign.  Attempts at explaining it was pt who needed to sign, pt seemed frustrated and said call her granddaughter. Called granddaughter who was arriving within the hour and would call me when she was with the patient.  By the time the granddaughter arrived there was no notary available in the hospital.  Set tentative appt with Ms. Blount (granddaughter) to try again tomorrow. Granddaughter requests Chaplian give her a phone call at [phone] to ensure she is with patient around 10:30am.  This Chaplain not available tomorrow morning so have referred this to fellow chaplain to attempt notary tomorrow.  De Burrs Chaplain Resident [phone]

## 2019-05-21 NOTE — Progress Notes (Addendum)
PROGRESS NOTE    Kim Mullins  YTK:[id number] DOB: 07-Dec-1929 DOA: 05/14/2019 PCP: Lin Landsman, MD   Brief Narrative:  Kim Mullins an 85 y.o.femalewith medical history significant ofhypertension, diet-controlled diabetes, asthma, on 2 to 3 L nasal cannula oxygen at home, stroke, GERD, OSA on CPAP, complete heart block,s/p ofpacemaker placement, CHF with EF 35%, CKD-III, who presentedwith generalized weaknessanddarkemesis. Kim Mullins had generalized weakness.She also has decreased appetite and poor oral intake recently.  Cardiology was consulted and following. Consulted palliative care for goals of care discussion due to multiple comorbidities advanced CHF,palliative care recommended outpatient follow-up.PT/OT recommended skilled facility placement.  Social worker consulted.  Assessment & Plan:   Principal Problem:   Hematemesis Active Problems:   Benign essential HTN   Pacemaker   History of stroke   Atrial fibrillation (HCC)   GERD (gastroesophageal reflux disease)   Chronic systolic CHF (congestive heart failure) (HCC)   Acute renal failure superimposed on stage 3 chronic kidney disease (HCC)   Fall   Hyponatremia   Hyperkalemia   FTT (failure to thrive) in adult   Cardiac amyloidosis (HCC)   FTT/contsitpation/hypoglycemia -Does not eat much,has poor appetitte -blood sugarswas low previously. Now improving. Continue Accu-Cheks. -Continue dysphagia 3 diet  ReportedHematemesis:per hergranddaughter, Kim Mullins had 5 episodes of coffee-ground emesis.Her hemoglobin is stable. -Since hemoglobin stable restartedEliquis -Continue protonix -suspect vomiting was from gastroparesis - Zofran for nausea - Avoid NSAIDs and SQ heparin -had EGDin 2015 05/19/2019: No new complaints.  No hematemesis.  Hypokalemia:  -Supplemented and corrected  FTD:DUKGU pressures are soft -Initially held Bumex due to worsening renal function. -Now resumed.   Continue to monitor blood pressure closely and adjust medications as needed.  Atrial Fibrillation: CHA2DS2-VASc Scoreis 8, needs oral anticoagulation. Kim Mullins is on Eliquis at home. Heart rate is well controlled. -Since hemoglobin stable,restartedEliquis.  History of stroke: -Resumedeliquis.Hgb stable  GERD (gastroesophageal reflux disease): -on protonix  Chronic systolic CHF (congestive heart failure) (HCC):2D echo 07/21/2018 showed EF of 35-40%. Kim Mullins does not have peripheral edema.  -Initially held diuretics (Bumex and metolazone) due to worsening renal function -Now Bumex started.  Heart failure physician following. Palliative care consult requested to discuss goals of care.  Recommended outpatient follow-up with palliative  care.  Remains full code  Acute renal failure superimposed on stage 3 chronic kidney disease (HCC):Baseline creatinine 1.2-1.3. Kidney function near baseline  Fall: CT-head negative foracute intracranial abnormalities. -PT/OT  Hyponatremia:Stable  Nutrition Problem: Increased nutrient needs Etiology: chronic illness      DVT prophylaxis:Eliquis Code Status: Full Family Communication: None present at the bedside Disposition Plan: Skilled nursing facility after cardiology clearance   Consultants: Cardiology  Procedures:None  Antimicrobials:  Anti-infectives (From admission, onward)   None      Subjective:  Kim Mullins seen and examined at bedside this morning.  Hemodynamically stable.  Not in any kind of distress, alert and oriented.  Slept well last night.  Denies any complaints.Looks weak and debilitated  Objective: Vitals:   05/20/19 0845 05/20/19 0914 05/20/19 2038 05/21/19 0539  BP:  97/75 104/73 97/75  Pulse:  (!) 59 63 (!) 58  Resp:   (!) 29 20  Temp:  (!) 97.5 F (36.4 C) 97.6 F (36.4 C) 98.1 F (36.7 C)  TempSrc:  Oral Oral Oral  SpO2: 100% 100% 99% 100%  Weight:    57.5 kg  Height:         Intake/Output Summary (Last 24 hours) at 05/21/2019 5427 Last data filed at 05/21/2019 0545 Gross per  24 hour  Intake 720 ml  Output 600 ml  Net 120 ml   Filed Weights   05/19/19 0532 05/20/19 0452 05/21/19 0539  Weight: 56 kg 56.2 kg 57.5 kg    Examination:  General exam: Not in distress, deconditioned/debilitated elderly female HEENT:PERRL,Oral mucosa moist, Ear/Nose normal on gross exam Respiratory system: Bilateral equal air entry, normal vesicular breath sounds, no wheezes or crackles  Cardiovascular system: S1 & S2 heard, RRR. No JVD, murmurs, rubs, gallops or clicks. No pedal edema. Gastrointestinal system: Abdomen is nondistended, soft and nontender. No organomegaly or masses felt. Normal bowel sounds heard. Central nervous system: Alert and oriented. No focal neurological deficits. Extremities: No edema, no clubbing ,no cyanosis, distal peripheral pulses palpable. Skin: No rashes, lesions or ulcers,no icterus ,no pallor     Data Reviewed: I have personally reviewed following labs and imaging studies  CBC: Recent Labs  Lab 05/14/19 1615 05/15/19 0516 05/16/19 0417 05/17/19 0408 05/19/19 0437  WBC 3.2* 3.3* 3.5* 3.5* 3.3*  NEUTROABS 2.2  --   --   --   --   HGB 15.5* 14.1 13.9 13.0 13.6  HCT 46.8* 43.1 42.0 39.8 41.7  MCV 91.6 91.9 91.5 91.1 89.5  PLT 162 137* 141* 141* 976*   Basic Metabolic Panel: Recent Labs  Lab 05/14/19 2112 05/15/19 0516 05/16/19 0417 05/17/19 0408 05/18/19 0230 05/19/19 0437  NA 130* 131* 130* 132* 132* 131*  K 5.7* 4.4 4.5 3.6 3.2* 4.6  CL 98 98 99 98 98 98  CO2 20* 19* 20* 23 20* 17*  GLUCOSE 112* 85 146* 106* 130* 110*  BUN 60* 55* 49* 46* 41* 38*  CREATININE 1.71* 1.61* 1.59* 1.72* 1.48* 1.40*  CALCIUM 9.3 8.7* 8.8* 8.6* 8.3* 8.4*  MG 2.8*  --   --   --   --   --    GFR: Estimated Creatinine Clearance: 23.9 mL/min (A) (by C-G formula based on SCr of 1.4 mg/dL (H)). Liver Function Tests: Recent Labs  Lab 05/14/19 2112   AST 46*  ALT 25  ALKPHOS 118  BILITOT 3.9*  PROT 8.2*  ALBUMIN 3.6   Recent Labs  Lab 05/14/19 2112  LIPASE 31   No results for input(s): AMMONIA in the last 168 hours. Coagulation Profile: Recent Labs  Lab 05/15/19 0516  INR 1.7*   Cardiac Enzymes: Recent Labs  Lab 05/15/19 0516  CKTOTAL 43   BNP (last 3 results) No results for input(s): PROBNP in the last 8760 hours. HbA1C: No results for input(s): HGBA1C in the last 72 hours. CBG: Recent Labs  Lab 05/18/19 0741 05/18/19 1114 05/18/19 1639 05/20/19 2035 05/21/19 0815  GLUCAP 115* 170* 110* 149* 109*   Lipid Profile: No results for input(s): CHOL, HDL, LDLCALC, TRIG, CHOLHDL, LDLDIRECT in the last 72 hours. Thyroid Function Tests: No results for input(s): TSH, T4TOTAL, FREET4, T3FREE, THYROIDAB in the last 72 hours. Anemia Panel: No results for input(s): VITAMINB12, FOLATE, FERRITIN, TIBC, IRON, RETICCTPCT in the last 72 hours. Sepsis Labs: No results for input(s): PROCALCITON, LATICACIDVEN in the last 168 hours.  Recent Results (from the past 240 hour(s))  SARS CORONAVIRUS 2 (TAT 6-24 HRS) Nasopharyngeal Nasopharyngeal Swab     Status: None   Collection Time: 05/14/19 10:10 PM   Specimen: Nasopharyngeal Swab  Result Value Ref Range Status   SARS Coronavirus 2 NEGATIVE NEGATIVE Final    Comment: (NOTE) SARS-CoV-2 target nucleic acids are NOT DETECTED. The SARS-CoV-2 RNA is generally detectable in upper and lower respiratory  specimens during the acute phase of infection. Negative results do not preclude SARS-CoV-2 infection, do not rule out co-infections with other pathogens, and should not be used as the sole basis for treatment or other Kim Mullins management decisions. Negative results must be combined with clinical observations, Kim Mullins history, and epidemiological information. The expected result is Negative. Fact Sheet for Patients: SugarRoll.be Fact Sheet for  Healthcare Providers: https://www.woods-mathews.com/ This test is not yet approved or cleared by the [place] FDA and  has been authorized for detection and/or diagnosis of SARS-CoV-2 by FDA under an Emergency Use Authorization (EUA). This EUA will remain  in effect (meaning this test can be used) for the duration of the COVID-19 declaration under Section 56 4(b)(1) of the Act, 21 U.S.C. section 360bbb-3(b)(1), unless the authorization is terminated or revoked sooner. Performed at [facility], [place] [address], [place], [place] [address]          Radiology Studies: No results found.      Scheduled Meds: . apixaban  2.5 mg Oral BID  . bumetanide  4 mg Oral BID  . dextrose  50 mL Intravenous Once  . feeding supplement (ENSURE ENLIVE)  237 mL Oral BID BM  . latanoprost  1 drop Both Eyes QHS  . loratadine  10 mg Oral Daily  . magnesium oxide  400 mg Oral Daily  . pantoprazole  40 mg Oral BID  . senna  1 tablet Oral QHS  . simethicone  80 mg Oral QID  . timolol  1 drop Both Eyes Daily   Continuous Infusions:   LOS: 6 days    Time spent: 35 mins.More than 50% of that time was spent in counseling and/or coordination of care.      Shelly Coss, MD [facility] Pager [phone]  If 7PM-7AM, please contact night-coverage [email or web address] Password [facility] 05/21/2019, 9:21 AM

## 2019-05-21 NOTE — Progress Notes (Signed)
Patient ID: Kim Mullins, female   DOB: [birth year], 85 y.o.   MRN: [id number]  Stable from my standpoint for SNF.  No changes, we will sign off.  Call with questions.   [name] 05/21/2019

## 2019-05-21 NOTE — Progress Notes (Signed)
Physical Therapy Treatment Patient Details Name: Kim Mullins MRN: [id number] DOB: December 12, 1929 Today's Date: 05/21/2019    History of Present Illness 85yo female presentijng with general weakness, coffee ground emesis. Recent fall. PMH HTN, DM, asthma on home O2, CVA, complete heart block s/p ICD, CHF with EF 35%, CKD stage III, A-fib, hx tibial fx, cardiac cath, THR, total shoulder arthroscopy    PT Comments    Pt in bed on entry with granddaughter in room, agreeable to ambulation in hallway. Pt limited in safe mobility by generalized weakness and decreased endurance. Pt is min guard for bed mobility, minA for transfers and hands-on min guard progressing to minA with ambulation with RW. Pt requiring 2x standing rest break before returning to room. D/c plans remain appropriate at this time. PT will continue to follow acutely.      Follow Up Recommendations  SNF;Supervision/Assistance - 24 hour(if patient/family refuse SNF, she will require HHPT and 24/7 physical assist/S)     Equipment Recommendations  Rolling walker with 5" wheels;3in1 (PT)       Precautions / Restrictions Precautions Precautions: Fall;ICD/Pacemaker Restrictions Weight Bearing Restrictions: No    Mobility  Bed Mobility Overal bed mobility: Needs Assistance Bed Mobility: Supine to Sit;Sit to Supine     Supine to sit: Min guard Sit to supine: Min guard;Min assist   General bed mobility comments: min guard, and increased time and effort to come to EoB and minA for returning LE back to bed  Transfers Overall transfer level: Needs assistance Equipment used: Rolling walker (2 wheeled);1 person hand held assist Transfers: Sit to/from [facility] Sit to Stand: Min assist         General transfer comment: minA for steadying with power up to RW  Ambulation/Gait Ambulation/Gait assistance: Min assist Gait Distance (Feet): 100 Feet Assistive device: Rolling walker (2 wheeled) Gait  Pattern/deviations: Step-through pattern;Decreased step length - right;Decreased step length - left;Decreased dorsiflexion - right;Decreased dorsiflexion - left;Decreased stride length;Drifts right/left;Trunk flexed Gait velocity: slowed Gait velocity interpretation: <1.31 ft/sec, indicative of household ambulator General Gait Details: hands on min guard for safety with ambulation, slow steady gait, fatigued and requires 2x standing rest breaks       Balance Overall balance assessment: Needs assistance;History of Falls Sitting-balance support: Bilateral upper extremity supported;Feet supported Sitting balance-Leahy Scale: Fair     Standing balance support: Bilateral upper extremity supported;During functional activity Standing balance-Leahy Scale: Fair Standing balance comment: able to static stand at [facility] requires UE support for dynamic balance                            Cognition Arousal/Alertness: Awake/alert   Overall Cognitive Status: History of cognitive impairments - at baseline Area of Impairment: Memory;Following commands;Safety/judgement;Awareness;Problem solving                     Memory: Decreased recall of precautions;Decreased short-term memory Following Commands: Follows one step commands with increased time;Follows multi-step commands with increased time Safety/Judgement: Decreased awareness of safety;Decreased awareness of deficits Awareness: Emergent Problem Solving: Slow processing;Requires verbal cues;Requires tactile cues General Comments: verbal and tactile cues for safe use of RW          General Comments General comments (skin integrity, edema, etc.): max HR during ambulation 106bpm, SaO2 prior to ambulation 95%O2 , pleth wave form disappeared during ambulation due to placement on toe. Pt requires 1x standing restbreak Once back in room SaO2 measured at  93%O2      Pertinent Vitals/Pain Pain Assessment: No/denies pain            PT Goals (current goals can now be found in the care plan section) Acute Rehab PT Goals Patient Stated Goal: to not fall PT Goal Formulation: Patient unable to participate in goal setting Time For Goal Achievement: 05/29/19 Potential to Achieve Goals: Fair Progress towards PT goals: Progressing toward goals    Frequency    Min 2X/week      PT Plan Current plan remains appropriate       AM-PAC PT "6 Clicks" Mobility   Outcome Measure  Help needed turning from your back to your side while in a flat bed without using bedrails?: A Little Help needed moving from lying on your back to sitting on the side of a flat bed without using bedrails?: A Little Help needed moving to and from a bed to a chair (including a wheelchair)?: A Lot Help needed standing up from a chair using your arms (e.g., wheelchair or bedside chair)?: A Lot Help needed to walk in hospital room?: A Lot Help needed climbing 3-5 steps with a railing? : A Lot 6 Click Score: 14    End of Session Equipment Utilized During Treatment: Gait belt;Oxygen Activity Tolerance: Patient tolerated treatment well Patient left: in chair;with call bell/phone within reach Nurse Communication: Mobility status PT Visit Diagnosis: Unsteadiness on feet (R26.81);History of falling (Z91.81);Muscle weakness (generalized) (M62.81);Difficulty in walking, not elsewhere classified (R26.2);Other abnormalities of gait and mobility (R26.89)     Time: 6010-9323 PT Time Calculation (min) (ACUTE ONLY): 19 min  Charges:  $Gait Training: 8-22 mins                     Ami Mally B. Migdalia Dk PT, DPT Acute Rehabilitation Services Pager [phone] Office [phone]    [place] 05/21/2019, 4:39 PM

## 2019-05-21 NOTE — Progress Notes (Signed)
Patient refused CPAP for the night. Pt is stable at this time

## 2019-05-21 NOTE — TOC Progression Note (Signed)
Transition of Care [facility]) - Progression Note    Patient Details  Name: Kim Mullins MRN: [id number] Date of Birth: 07/10/1930  [facility] of [facility]) CM/SW Contact  Eileen Stanford, LCSW Phone Number: 05/21/2019, 11:11 AM  Clinical Narrative:   Holli Humbles still pending    Expected Discharge Plan: [place] Barriers to Discharge: Continued Medical Work up  Expected Discharge Plan and Services Expected Discharge Plan: [place]   Discharge Planning Services: NA   Living arrangements for the past 2 months: Single Family Home                                       Social Determinants of Health (SDOH) Interventions    Readmission Risk Interventions No flowsheet data found.

## 2019-05-22 LAB — CBC WITH DIFFERENTIAL/PLATELET
Abs Immature Granulocytes: 0.02 10*3/uL (ref 0.00–0.07)
Basophils Absolute: 0 10*3/uL (ref 0.0–0.1)
Basophils Relative: 1 %
Eosinophils Absolute: 0.2 10*3/uL (ref 0.0–0.5)
Eosinophils Relative: 5 %
HCT: 39 % (ref 36.0–46.0)
Hemoglobin: 12.9 g/dL (ref 12.0–15.0)
Immature Granulocytes: 1 %
Lymphocytes Relative: 18 %
Lymphs Abs: 0.6 10*3/uL — ABNORMAL LOW (ref 0.7–4.0)
MCH: 29.6 pg (ref 26.0–34.0)
MCHC: 33.1 g/dL (ref 30.0–36.0)
MCV: 89.4 fL (ref 80.0–100.0)
Monocytes Absolute: 0.3 10*3/uL (ref 0.1–1.0)
Monocytes Relative: 8 %
Neutro Abs: 2.4 10*3/uL (ref 1.7–7.7)
Neutrophils Relative %: 67 %
Platelets: 123 10*3/uL — ABNORMAL LOW (ref 150–400)
RBC: 4.36 MIL/uL (ref 3.87–5.11)
RDW: 17 % — ABNORMAL HIGH (ref 11.5–15.5)
WBC: 3.5 10*3/uL — ABNORMAL LOW (ref 4.0–10.5)
nRBC: 0 % (ref 0.0–0.2)

## 2019-05-22 LAB — BASIC METABOLIC PANEL
Anion gap: 12 (ref 5–15)
BUN: 45 mg/dL — ABNORMAL HIGH (ref 8–23)
CO2: 22 mmol/L (ref 22–32)
Calcium: 8.2 mg/dL — ABNORMAL LOW (ref 8.9–10.3)
Chloride: 96 mmol/L — ABNORMAL LOW (ref 98–111)
Creatinine, Ser: 1.49 mg/dL — ABNORMAL HIGH (ref 0.44–1.00)
GFR calc Af Amer: 36 mL/min — ABNORMAL LOW (ref >60)
GFR calc non Af Amer: 31 mL/min — ABNORMAL LOW (ref >60)
Glucose, Bld: 164 mg/dL — ABNORMAL HIGH (ref 70–99)
Potassium: 4.1 mmol/L (ref 3.5–5.1)
Sodium: 130 mmol/L — ABNORMAL LOW (ref 135–145)

## 2019-05-22 LAB — GLUCOSE, CAPILLARY
Glucose-Capillary: 118 mg/dL — ABNORMAL HIGH (ref 70–99)
Glucose-Capillary: 196 mg/dL — ABNORMAL HIGH (ref 70–99)
Glucose-Capillary: 134 mg/dL — ABNORMAL HIGH (ref 70–99)
Glucose-Capillary: 175 mg/dL — ABNORMAL HIGH (ref 70–99)

## 2019-05-22 MED ORDER — MAGNESIUM CITRATE PO SOLN
1.0000 | Freq: Once | ORAL | Status: AC
  Administered 2019-05-22: 1 via ORAL
  Filled 2019-05-22: qty 296

## 2019-05-22 NOTE — Progress Notes (Signed)
Chaplain assisted the family in completing and signing healthcare power of attorney.  Brion Aliment Chaplain Resident For questions concerning this note please contact me by pager [phone]

## 2019-05-22 NOTE — TOC Progression Note (Deleted)
Transition of Care [facility]) - Progression Note    Patient Details  Name: Kim Mullins MRN: [id number] Date of Birth: 08-Nov-1929  [facility] CM/SW Contact  Eileen Stanford, LCSW Phone Number: 05/22/2019, 2:05 PM  Clinical Narrative:   Pt got denied by [employer] [place]. CSW spoke with pt's Granddaughter and she is ok with pt d/c home with home health.  2:41- Pt's granddaughter called back stating she would like to do a expedited appeal. CSW provided the number. Awaiting decision.   3:37- Granddaughters appeal case was open they have 72 hours to make a decision. Granddaughter has to fax Power of Anthony paperwork.  4:05- CSW faxed clinical for [employer] at fax number provided by [facility] and given to Granddaughter [phone]). Appeal case #: [id number]    Expected Discharge Plan: [place] Barriers to Discharge: Continued Medical Work up  Expected Discharge Plan and Services Expected Discharge Plan: [place]   Discharge Planning Services: NA   Living arrangements for the past 2 months: Single Family Home                                       Social Determinants of Health (SDOH) Interventions    Readmission Risk Interventions No flowsheet data found.

## 2019-05-22 NOTE — Progress Notes (Addendum)
PROGRESS NOTE    Kim Mullins  EYC:[id number] DOB: 15-Jan-1930 DOA: 05/14/2019 PCP: Lin Landsman, MD   Brief Narrative:  Kim Mullins an 85 y.o.femalewith medical history significant ofhypertension, diet-controlled diabetes, asthma, on 2 to 3 L nasal cannula oxygen at home, stroke, GERD, OSA on CPAP, complete heart block,s/p ofpacemaker placement, CHF with EF 35%, CKD-III, who presentedwith generalized weaknessanddarkemesis. Patient had generalized weakness.She also has decreased appetite and poor oral intake recently.  Cardiology was consulted and was following. Consulted palliative care for goals of care discussion due to multiple comorbidities advanced CHF,palliative care recommended outpatient follow-up.PT/OT recommended skilled facility placement.  Social worker consulted.Her insurance denied SNF.Plan for possible discharge to home with home health.  Assessment & Plan:   Principal Problem:   Hematemesis Active Problems:   Benign essential HTN   Pacemaker   History of stroke   Atrial fibrillation (HCC)   GERD (gastroesophageal reflux disease)   Chronic systolic CHF (congestive heart failure) (HCC)   Acute renal failure superimposed on stage 3 chronic kidney disease (HCC)   Fall   Hyponatremia   Hyperkalemia   FTT (failure to thrive) in adult   Cardiac amyloidosis (HCC)   FTT/contsitpation/hypoglycemia -Does not eat much,has poor appetitte -blood sugarswas low previously. Now improving. Continue Accu-Cheks. -Continue dysphagia 3 diet  ReportedHematemesis:per hergranddaughter, patient had 5 episodes of coffee-ground emesis.Her hemoglobin is stable. -Since hemoglobin stable ,restartedEliquis -Continue protonix -suspect vomiting was from gastroparesis - Zofran for nausea - Avoid NSAIDs and SQ heparin -had EGDin 2015 05/19/2019: No new complaints.  No hematemesis.  Hypokalemia:  -Supplemented and corrected  JSH:FWYOV pressures  are soft -Initially held Bumex due to worsening renal function. -Now resumed.  Continue to monitor blood pressure closely and adjust medications as needed.  Atrial Fibrillation: CHA2DS2-VASc Scoreis 8, needs oral anticoagulation. Patient is on Eliquis at home. Heart rate is well controlled. -Since hemoglobin stable,restartedEliquis.  History of stroke: -Resumedeliquis.Hgb stable  GERD (gastroesophageal reflux disease): -on protonix  Chronic systolic CHF (congestive heart failure) (HCC):2D echo 07/21/2018 showed EF of 35-40%. Patient does not have peripheral edema.  -Initially held diuretics (Bumex and metolazone) due to worsening renal function -Now Bumex started.  Heart failure was following. Palliative care consult requested to discuss goals of care.  Recommended outpatient follow-up with palliative  care.  Remains full code -She is on 2 Litres of oxygen at home  Acute renal failure superimposed on stage 3 chronic kidney disease (HCC):Baseline creatinine 1.2-1.3. Kidney function near baseline  Fall: CT-head negative foracute intracranial abnormalities. -PT/OT  Hyponatremia:Stable  Debility/Deconditioning: PT recommended skilled nursing facility.  Insurance denied.  Social worker following.  PT/OT evaluations performed. SNF recommended. SNF appropriate as the patient has received 3 days of hospital care (or the 3 day period has been waived by the patient's insurance company) and is felt to need rehab services to restore this patient to their prior level of function to achieve safe transition back to home care. This patient needs rehab services for at least 5 days per week and skilled nursing services daily to facilitate this transition. Rehab is being requested as the most appropriate d/c option for this patient and is NOT felt to be for custodial care as evidenced by her good functional status at home.   Nutrition Problem: Increased nutrient needs Etiology:  chronic illness      DVT prophylaxis:Eliquis Code Status: Full Family Communication: Grand daughter  Disposition Plan: Home with Home health likely tomorrow   Consultants: Cardiology  Procedures:None  Antimicrobials:  Anti-infectives (From admission, onward)   None      Subjective:  Patient seen and examined at bedside this morning.  Hemodynamically stable.  Comfortable.  Denies any complaints.  Looks much better than yesterday.  Objective: Vitals:   05/21/19 2046 05/22/19 0519 05/22/19 0524 05/22/19 0719  BP: 97/79  96/83   Pulse: 61  64 (!) 59  Resp: (!) 25  (!) 24 17  Temp: 97.8 F (36.6 C)  97.7 F (36.5 C)   TempSrc: Oral  Oral   SpO2: 100%  100% 100%  Weight:  57.9 kg    Height:        Intake/Output Summary (Last 24 hours) at 05/22/2019 1402 Last data filed at 05/22/2019 6948 Gross per 24 hour  Intake 360 ml  Output 900 ml  Net -540 ml   Filed Weights   05/20/19 0452 05/21/19 0539 05/22/19 0519  Weight: 56.2 kg 57.5 kg 57.9 kg    Examination:   General exam: Appears calm and comfortable ,Not in distress, deconditioned/debilitated elderly female  HEENT:PERRL,Oral mucosa moist, Ear/Nose normal on gross exam Respiratory system: Bilateral equal air entry, normal vesicular breath sounds, no wheezes or crackles  Cardiovascular system: S1 & S2 heard, RRR. No JVD, murmurs, rubs, gallops or clicks. Gastrointestinal system: Abdomen is nondistended, soft and nontender. No organomegaly or masses felt. Normal bowel sounds heard. Central nervous system: Alert and oriented. No focal neurological deficits. Extremities: No edema, no clubbing ,no cyanosis, distal peripheral pulses palpable. Skin: No rashes, lesions or ulcers,no icterus ,no pallor MSK: Normal muscle bulk,tone ,power Psychiatry: Judgement and insight appear normal. Mood & affect appropriate.     Data Reviewed: I have personally reviewed following labs and imaging studies  CBC: Recent Labs  Lab  05/16/19 0417 05/17/19 0408 05/19/19 0437 05/22/19 0324  WBC 3.5* 3.5* 3.3* 3.5*  NEUTROABS  --   --   --  2.4  HGB 13.9 13.0 13.6 12.9  HCT 42.0 39.8 41.7 39.0  MCV 91.5 91.1 89.5 89.4  PLT 141* 141* 143* 546*   Basic Metabolic Panel: Recent Labs  Lab 05/16/19 0417 05/17/19 0408 05/18/19 0230 05/19/19 0437 05/22/19 0324  NA 130* 132* 132* 131* 130*  K 4.5 3.6 3.2* 4.6 4.1  CL 99 98 98 98 96*  CO2 20* 23 20* 17* 22  GLUCOSE 146* 106* 130* 110* 164*  BUN 49* 46* 41* 38* 45*  CREATININE 1.59* 1.72* 1.48* 1.40* 1.49*  CALCIUM 8.8* 8.6* 8.3* 8.4* 8.2*   GFR: Estimated Creatinine Clearance: 22.4 mL/min (A) (by C-G formula based on SCr of 1.49 mg/dL (H)). Liver Function Tests: No results for input(s): AST, ALT, ALKPHOS, BILITOT, PROT, ALBUMIN in the last 168 hours. No results for input(s): LIPASE, AMYLASE in the last 168 hours. No results for input(s): AMMONIA in the last 168 hours. Coagulation Profile: No results for input(s): INR, PROTIME in the last 168 hours. Cardiac Enzymes: No results for input(s): CKTOTAL, CKMB, CKMBINDEX, TROPONINI in the last 168 hours. BNP (last 3 results) No results for input(s): PROBNP in the last 8760 hours. HbA1C: No results for input(s): HGBA1C in the last 72 hours. CBG: Recent Labs  Lab 05/21/19 1141 05/21/19 1702 05/21/19 2050 05/22/19 0814 05/22/19 1123  GLUCAP 168* 172* 192* 118* 196*   Lipid Profile: No results for input(s): CHOL, HDL, LDLCALC, TRIG, CHOLHDL, LDLDIRECT in the last 72 hours. Thyroid Function Tests: No results for input(s): TSH, T4TOTAL, FREET4, T3FREE, THYROIDAB in the last 72 hours. Anemia Panel: No results for  input(s): VITAMINB12, FOLATE, FERRITIN, TIBC, IRON, RETICCTPCT in the last 72 hours. Sepsis Labs: No results for input(s): PROCALCITON, LATICACIDVEN in the last 168 hours.  Recent Results (from the past 240 hour(s))  SARS CORONAVIRUS 2 (TAT 6-24 HRS) Nasopharyngeal Nasopharyngeal Swab     Status:  None   Collection Time: 05/14/19 10:10 PM   Specimen: Nasopharyngeal Swab  Result Value Ref Range Status   SARS Coronavirus 2 NEGATIVE NEGATIVE Final    Comment: (NOTE) SARS-CoV-2 target nucleic acids are NOT DETECTED. The SARS-CoV-2 RNA is generally detectable in upper and lower respiratory specimens during the acute phase of infection. Negative results do not preclude SARS-CoV-2 infection, do not rule out co-infections with other pathogens, and should not be used as the sole basis for treatment or other patient management decisions. Negative results must be combined with clinical observations, patient history, and epidemiological information. The expected result is Negative. Fact Sheet for Patients: SugarRoll.be Fact Sheet for Healthcare Providers: https://www.woods-mathews.com/ This test is not yet approved or cleared by the [place] FDA and  has been authorized for detection and/or diagnosis of SARS-CoV-2 by FDA under an Emergency Use Authorization (EUA). This EUA will remain  in effect (meaning this test can be used) for the duration of the COVID-19 declaration under Section 56 4(b)(1) of the Act, 21 U.S.C. section 360bbb-3(b)(1), unless the authorization is terminated or revoked sooner. Performed at [facility], [place] [address] [place], [place] [address]          Radiology Studies: No results found.      Scheduled Meds: . apixaban  2.5 mg Oral BID  . bumetanide  4 mg Oral BID  . dextrose  50 mL Intravenous Once  . feeding supplement (ENSURE ENLIVE)  237 mL Oral BID BM  . latanoprost  1 drop Both Eyes QHS  . loratadine  10 mg Oral Daily  . magnesium oxide  400 mg Oral Daily  . pantoprazole  40 mg Oral BID  . senna  1 tablet Oral QHS  . simethicone  80 mg Oral QID  . timolol  1 drop Both Eyes Daily   Continuous Infusions:   LOS: 7 days    Time spent: 35 mins.More than 50% of that time was spent in  counseling and/or coordination of care.      Shelly Coss, MD [facility] Pager [phone]  If 7PM-7AM, please contact night-coverage [email or web address] Password [email or web address] 05/22/2019, 2:02 PM

## 2019-05-22 NOTE — TOC Progression Note (Addendum)
Transition of Care [facility]) - Progression Note    Patient Details  Name: Donald Jacque MRN: [id number] Date of Birth: 02/18/1930  Transition of Care [facility]) CM/SW Contact  Eileen Stanford, LCSW Phone Number: 05/22/2019, 3:39 PM  Clinical Narrative:   Pt lives home alone therefore would not have 24 hour supervision. Granddaughter proceeded with appeal.   3:37- Granddaughters appeal case was open they have 72 hours to make a decision. Granddaughter has to fax Power of [place] paperwork.  4:05- CSW faxed clinical for [employer] at fax number provided by [facility] and given to Granddaughter [phone]). Appeal case #: [id number]. Fax confirmed received. (9/22, 16:02 )     Expected Discharge Plan: [place] Barriers to Discharge: Continued Medical Work up  Expected Discharge Plan and Services Expected Discharge Plan: [place]   Discharge Planning Services: NA   Living arrangements for the past 2 months: Single Family Home                                       Social Determinants of Health (SDOH) Interventions    Readmission Risk Interventions No flowsheet data found.

## 2019-05-22 NOTE — Progress Notes (Signed)
CSW following patient through outpatient [facility] program.  CSW visited with pt and pt granddaughter at bedside to check in.  Patient reports feeling much better at this time.  Granddaughter hopeful that patient will be able to go to rehab prior to going home- awaiting determination from insurance per granddaughter.  CSW will continue to follow patient through outpatient paramedicine program and assist with follow up as needed  Jorge Ny, [facility] Desk#: [phone] Cell#: [phone]

## 2019-05-22 NOTE — TOC Progression Note (Signed)
Transition of Care [facility]) - Progression Note    Patient Details  Name: Kim Mullins MRN: [id number] Date of Birth: [birth year]  Transition of Care [facility]) CM/SW Contact  Eileen Stanford, LCSW Phone Number: 05/22/2019, 10:54 AM  Clinical Narrative:   Pt has been denied by [employer]. CSW provided MD information to complete Peer to Peer. MD states he can't call. MD states he will call Granddaughter and d/c pt home with home health.    Expected Discharge Plan: [place] Barriers to Discharge: Continued Medical Work up  Expected Discharge Plan and Services Expected Discharge Plan: [place]   Discharge Planning Services: NA   Living arrangements for the past 2 months: Single Family Home                                       Social Determinants of Health (SDOH) Interventions    Readmission Risk Interventions No flowsheet data found.

## 2019-05-23 LAB — GLUCOSE, CAPILLARY
Glucose-Capillary: 117 mg/dL — ABNORMAL HIGH (ref 70–99)
Glucose-Capillary: 199 mg/dL — ABNORMAL HIGH (ref 70–99)
Glucose-Capillary: 156 mg/dL — ABNORMAL HIGH (ref 70–99)
Glucose-Capillary: 138 mg/dL — ABNORMAL HIGH (ref 70–99)

## 2019-05-23 MED ORDER — POLYETHYLENE GLYCOL 3350 17 G PO PACK
17.0000 g | PACK | Freq: Every day | ORAL | Status: DC
  Filled 2019-05-23 (×2): qty 1

## 2019-05-23 NOTE — Care Management Important Message (Signed)
Important Message  Patient Details  Name: Jennipher Weatherholtz MRN: [id number] Date of Birth: 03-May-1930   Medicare Important Message Given:  Yes     Shelda Altes 05/23/2019, 1:48 PM

## 2019-05-23 NOTE — Progress Notes (Signed)
Nutrition Follow-up  INTERVENTION:   -Ensure Enlive po BID, each supplement provides 350 kcal and 20 grams of protein  NUTRITION DIAGNOSIS:   Increased nutrient needs related to chronic illness as evidenced by estimated needs.  Ongoing.  GOAL:   Patient will meet greater than or equal to 90% of their needs  Progressing.  MONITOR:   PO intake, Supplement acceptance, Labs, Weight trends, I & O's  ASSESSMENT:   85 y.o. female with medical history significant of hypertension, diet-controlled diabetes, asthma, on 2 to 3 L nasal cannula oxygen at home, stroke, GERD, OSA on CPAP, complete heart block, s/p of pacemaker placement, CHF with EF 35%, CKD-III, who presented with generalized weakness and dark emesis. Patient has generalized weakness.  She also has decreased appetite and poor oral intake recently.  **RD working remotely**  Patient currently consuming 75% of meals at this time. Pt has been drinking Ensure supplements.  Per MD note, pt pending SNF placement.   Admission weight: 120 lbs. Current weight: 128 lbs.  I/Os: +386 ml UOP 9/22: 300 ml  Medications: MAG-OX tablet   Labs reviewed: CBGs: 117-199 Low Na GFR: 36  Diet Order:   Diet Order            DIET DYS 3 Room service appropriate? Yes; Fluid consistency: Thin  Diet effective now              EDUCATION NEEDS:   No education needs have been identified at this time  Skin:  Skin Assessment: Reviewed RN Assessment  Last BM:  9/23  Height:   Ht Readings from Last 1 Encounters:  05/16/19 5\' 3"  (1.6 m)    Weight:   Wt Readings from Last 1 Encounters:  05/23/19 58.5 kg    Ideal Body Weight:  52.3 kg  BMI:  Body mass index is 22.85 kg/m.  Estimated Nutritional Needs:   Kcal:  1400-1600  Protein:  60-70g  Fluid:  1.6L/day  Clayton Bibles, MS, RD, LDN Inpatient Clinical Dietitian Pager: [phone] After Hours Pager: [phone]

## 2019-05-23 NOTE — Progress Notes (Signed)
PROGRESS NOTE    Kim Mullins  WNU:[id number] DOB: Apr 15, 1930 DOA: 05/14/2019 PCP: Lin Landsman, MD   Brief Narrative:  Kim Mullins an 85 y.o.femalewith medical history significant ofhypertension, diet-controlled diabetes, asthma, on 2 to 3 L nasal cannula oxygen at home, stroke, GERD, OSA on CPAP, complete heart block,s/p ofpacemaker placement, CHF with EF 35%, CKD-III, who presentedwith generalized weaknessanddarkemesis. Patient had generalized weakness.She also has decreased appetite and poor oral intake recently.  Cardiology was consulted and was following. Consulted palliative care for goals of care discussion due to multiple comorbidities advanced CHF,palliative care recommended outpatient follow-up.PT/OT recommended skilled facility placement.  Social worker consulted.Her insurance denied SNF.Pending placement. Assessment & Plan:   Principal Problem:   Hematemesis Active Problems:   Benign essential HTN   Pacemaker   History of stroke   Atrial fibrillation (HCC)   GERD (gastroesophageal reflux disease)   Chronic systolic CHF (congestive heart failure) (HCC)   Acute renal failure superimposed on stage 3 chronic kidney disease (HCC)   Fall   Hyponatremia   Hyperkalemia   FTT (failure to thrive) in adult   Cardiac amyloidosis (HCC)   FTT/contsitpation/hypoglycemia -Does not eat much,has poor appetitte -blood sugarswas low previously. Now improving. Continue Accu-Cheks. -Continue dysphagia 3 diet  ReportedHematemesis:per hergranddaughter, patient had 5 episodes of coffee-ground emesis.Her hemoglobin is stable. -Since hemoglobin stable ,restartedEliquis -Continue protonix -suspect vomiting was from gastroparesis - Zofran for nausea - Avoid NSAIDs and SQ heparin -had EGDin 2015 05/19/2019: No new complaints.  No hematemesis.  Hypokalemia:  -Supplemented and corrected  IHK:VQQVZ pressures are soft -Initially held Bumex due  to worsening renal function. -Now resumed.  Continue to monitor blood pressure closely and adjust medications as needed.  Atrial Fibrillation: CHA2DS2-VASc Scoreis 8, needs oral anticoagulation. Patient is on Eliquis at home. Heart rate is well controlled. -Since hemoglobin stable,restartedEliquis.  History of stroke: -Resumedeliquis.Hgb stable  GERD (gastroesophageal reflux disease): -on protonix  Chronic systolic CHF (congestive heart failure) (HCC):2D echo 07/21/2018 showed EF of 35-40%. Patient does not have peripheral edema.  -Initially held diuretics (Bumex and metolazone) due to worsening renal function -Now Bumex started.  Heart failure was following. Palliative care consult requested to discuss goals of care.  Recommended outpatient follow-up with palliative  care.  Remains full code -She is on 2 Litres of oxygen at home  Acute renal failure superimposed on stage 3 chronic kidney disease (HCC):Baseline creatinine 1.2-1.3. Kidney function near baseline  Fall: CT-head negative foracute intracranial abnormalities. -PT/OT  Hyponatremia:Stable  Debility/Deconditioning: PT recommended skilled nursing facility.  Insurance denied.  Social worker following.  PT/OT evaluations performed. SNF recommended. SNF appropriate as the patient has received 3 days of hospital care (or the 3 day period has been waived by the patient's insurance company) and is felt to need rehab services to restore this patient to their prior level of function to achieve safe transition back to home care. This patient needs rehab services for at least 5 days per week and skilled nursing services daily to facilitate this transition. Rehab is being requested as the most appropriate d/c option for this patient and is NOT felt to be for custodial care as evidenced by her good functional status at home.   Nutrition Problem: Increased nutrient needs Etiology: chronic illness      DVT  prophylaxis:Eliquis Code Status: Full Family Communication: Grand daughter on 05/22/19 Disposition Plan: Home with home health vs SNF. Insurance has denied but family appealed  Consultants: Cardiology  Procedures:None  Antimicrobials:  Anti-infectives (  From admission, onward)   None      Subjective:  Patient seen and examined the bedside this morning.  Hemodynamically stable.  Comfortable.  No any issues since yesterday   Objective: Vitals:   05/22/19 1436 05/22/19 2053 05/23/19 0355 05/23/19 1301  BP: 107/76 (!) 143/67 106/90 90/76  Pulse: 60 82 60 68  Resp: 19 20 18 15   Temp: 98.6 F (37 C) 98.6 F (37 C) 98 F (36.7 C) (!) 97.5 F (36.4 C)  TempSrc: Oral Oral Oral Oral  SpO2: 100% 99% 100% 100%  Weight:   58.5 kg   Height:        Intake/Output Summary (Last 24 hours) at 05/23/2019 1351 Last data filed at 05/23/2019 0356 Gross per 24 hour  Intake 700 ml  Output 300 ml  Net 400 ml   Filed Weights   05/21/19 0539 05/22/19 0519 05/23/19 0355  Weight: 57.5 kg 57.9 kg 58.5 kg    Examination:   General exam: Appears calm and comfortable ,Not in distress, deconditioned elderly female  HEENT:PERRL,Oral mucosa moist, Ear/Nose normal on gross exam Respiratory system: Bilateral equal air entry, normal vesicular breath sounds, no wheezes or crackles  Cardiovascular system: S1 & S2 heard, RRR. No JVD, murmurs, rubs, gallops or clicks. Gastrointestinal system: Abdomen is nondistended, soft and nontender. No organomegaly or masses felt. Normal bowel sounds heard. Central nervous system: Alert and oriented. No focal neurological deficits. Extremities: No edema, no clubbing ,no cyanosis, distal peripheral pulses palpable. Skin: No rashes, lesions or ulcers,no icterus ,no pallor  Data Reviewed: I have personally reviewed following labs and imaging studies  CBC: Recent Labs  Lab 05/17/19 0408 05/19/19 0437 05/22/19 0324  WBC 3.5* 3.3* 3.5*  NEUTROABS  --   --  2.4   HGB 13.0 13.6 12.9  HCT 39.8 41.7 39.0  MCV 91.1 89.5 89.4  PLT 141* 143* 253*   Basic Metabolic Panel: Recent Labs  Lab 05/17/19 0408 05/18/19 0230 05/19/19 0437 05/22/19 0324  NA 132* 132* 131* 130*  K 3.6 3.2* 4.6 4.1  CL 98 98 98 96*  CO2 23 20* 17* 22  GLUCOSE 106* 130* 110* 164*  BUN 46* 41* 38* 45*  CREATININE 1.72* 1.48* 1.40* 1.49*  CALCIUM 8.6* 8.3* 8.4* 8.2*   GFR: Estimated Creatinine Clearance: 22.4 mL/min (A) (by C-G formula based on SCr of 1.49 mg/dL (H)). Liver Function Tests: No results for input(s): AST, ALT, ALKPHOS, BILITOT, PROT, ALBUMIN in the last 168 hours. No results for input(s): LIPASE, AMYLASE in the last 168 hours. No results for input(s): AMMONIA in the last 168 hours. Coagulation Profile: No results for input(s): INR, PROTIME in the last 168 hours. Cardiac Enzymes: No results for input(s): CKTOTAL, CKMB, CKMBINDEX, TROPONINI in the last 168 hours. BNP (last 3 results) No results for input(s): PROBNP in the last 8760 hours. HbA1C: No results for input(s): HGBA1C in the last 72 hours. CBG: Recent Labs  Lab 05/22/19 1123 05/22/19 1656 05/22/19 2200 05/23/19 0751 05/23/19 1146  GLUCAP 196* 134* 175* 117* 199*   Lipid Profile: No results for input(s): CHOL, HDL, LDLCALC, TRIG, CHOLHDL, LDLDIRECT in the last 72 hours. Thyroid Function Tests: No results for input(s): TSH, T4TOTAL, FREET4, T3FREE, THYROIDAB in the last 72 hours. Anemia Panel: No results for input(s): VITAMINB12, FOLATE, FERRITIN, TIBC, IRON, RETICCTPCT in the last 72 hours. Sepsis Labs: No results for input(s): PROCALCITON, LATICACIDVEN in the last 168 hours.  Recent Results (from the past 240 hour(s))  SARS CORONAVIRUS 2 (  TAT 6-24 HRS) Nasopharyngeal Nasopharyngeal Swab     Status: None   Collection Time: 05/14/19 10:10 PM   Specimen: Nasopharyngeal Swab  Result Value Ref Range Status   SARS Coronavirus 2 NEGATIVE NEGATIVE Final    Comment: (NOTE) SARS-CoV-2 target  nucleic acids are NOT DETECTED. The SARS-CoV-2 RNA is generally detectable in upper and lower respiratory specimens during the acute phase of infection. Negative results do not preclude SARS-CoV-2 infection, do not rule out co-infections with other pathogens, and should not be used as the sole basis for treatment or other patient management decisions. Negative results must be combined with clinical observations, patient history, and epidemiological information. The expected result is Negative. Fact Sheet for Patients: SugarRoll.be Fact Sheet for Healthcare Providers: https://www.woods-mathews.com/ This test is not yet approved or cleared by the [place] FDA and  has been authorized for detection and/or diagnosis of SARS-CoV-2 by FDA under an Emergency Use Authorization (EUA). This EUA will remain  in effect (meaning this test can be used) for the duration of the COVID-19 declaration under Section 56 4(b)(1) of the Act, 21 U.S.C. section 360bbb-3(b)(1), unless the authorization is terminated or revoked sooner. Performed at [facility], [place] [address], [place], [place] [address]          Radiology Studies: No results found.      Scheduled Meds: . apixaban  2.5 mg Oral BID  . bumetanide  4 mg Oral BID  . dextrose  50 mL Intravenous Once  . feeding supplement (ENSURE ENLIVE)  237 mL Oral BID BM  . latanoprost  1 drop Both Eyes QHS  . loratadine  10 mg Oral Daily  . magnesium oxide  400 mg Oral Daily  . pantoprazole  40 mg Oral BID  . senna  1 tablet Oral QHS  . simethicone  80 mg Oral QID  . timolol  1 drop Both Eyes Daily   Continuous Infusions:   LOS: 8 days    Time spent: 35 mins.More than 50% of that time was spent in counseling and/or coordination of care.      Shelly Coss, MD [facility] Pager [phone]  If 7PM-7AM, please contact night-coverage [email or web address] Password [email or web address] 05/23/2019,  1:51 PM

## 2019-05-24 LAB — GLUCOSE, CAPILLARY
Glucose-Capillary: 109 mg/dL — ABNORMAL HIGH (ref 70–99)
Glucose-Capillary: 192 mg/dL — ABNORMAL HIGH (ref 70–99)
Glucose-Capillary: 159 mg/dL — ABNORMAL HIGH (ref 70–99)
Glucose-Capillary: 118 mg/dL — ABNORMAL HIGH (ref 70–99)

## 2019-05-24 MED ORDER — DIPHENHYDRAMINE HCL 25 MG PO CAPS
50.0000 mg | ORAL_CAPSULE | Freq: Once | ORAL | Status: AC
  Administered 2019-05-24: 22:00:00 50 mg via ORAL
  Filled 2019-05-24: qty 2

## 2019-05-24 MED ORDER — MAGNESIUM CITRATE PO SOLN
1.0000 | Freq: Once | ORAL | Status: AC
  Administered 2019-05-24: 1 via ORAL
  Filled 2019-05-24: qty 296

## 2019-05-24 NOTE — TOC Progression Note (Signed)
Transition of Care [facility]) - Progression Note    Patient Details  Name: Daleyza Gadomski MRN: [id number] Date of Birth: [birth year]  Transition of Care [facility]) CM/SW [place], [place] Phone Number: 05/24/2019, 2:35 PM  Clinical Narrative:    PT note sent to [facility] for appeal; faxed to [employer] at [phone]   Expected Discharge Plan: [place] Barriers to Discharge: Continued Medical Work up  Expected Discharge Plan and Services Expected Discharge Plan: [place] Discharge Planning Services: NA Living arrangements for the past 2 months: Single Family Home     Social Determinants of Health (SDOH) Interventions    Readmission Risk Interventions No flowsheet data found.

## 2019-05-24 NOTE — Progress Notes (Signed)
Patient claims she does not wear CPAP at home and does not know what it is for. RT will monitor as needed

## 2019-05-24 NOTE — Progress Notes (Signed)
Occupational Therapy Treatment Patient Details Name: Kim Mullins MRN: [id number] DOB: Jan 11, 1930 Today's Date: 05/24/2019    History of present illness 85yo female presentijng with general weakness, coffee ground emesis. Recent fall. PMH HTN, DM, asthma on home O2, CVA, complete heart block s/p ICD, CHF with EF 35%, CKD stage III, A-fib, hx tibial fx, cardiac cath, THR, total shoulder arthroscopy   OT comments  Pt making steady progress towards OT goals this session. Session focus on functional mobility and toileting. Pt MIN A for sit>stand from recliner with RW. Most assisted needed for initial power up. MIN A for stand pivot transfer to [facility]; cues needed for safety with RW and reaching back for [facility] when sitting. Pt on 1.5 L O2 via [place] with sats remaining 95 and above during session. Pt remains appropriate candidate for SNF level therapies as pt was living alone before current admission and completely independent with ADLs/ IADLS. Feel structure of SNF level therapies will provide pt with the continued skilled care she needs to maximize functional independence to be able to return to PLOF. Will continue to follow for acute OT needs.    Follow Up Recommendations  SNF;Other (comment)    Equipment Recommendations  None recommended by OT    Recommendations for Other Services      Precautions / Restrictions Precautions Precautions: Fall;ICD/Pacemaker Restrictions Weight Bearing Restrictions: No       Mobility Bed Mobility               General bed mobility comments: OOB in recliner on entry   Transfers Overall transfer level: Needs assistance Equipment used: Rolling walker (2 wheeled) Transfers: Sit to/from [facility] Sit to Stand: Min assist Stand pivot transfers: Min assist       General transfer comment: minA for power up and steadying in RW    Balance Overall balance assessment: Needs assistance;History of Falls Sitting-balance support:  Bilateral upper extremity supported;Feet supported Sitting balance-Leahy Scale: Fair     Standing balance support: Bilateral upper extremity supported;During functional activity Standing balance-Leahy Scale: Poor Standing balance comment: reliant on BUE support                           ADL either performed or assessed with clinical judgement   ADL Overall ADL's : Needs assistance/impaired                         Toilet Transfer: RW;Cueing for safety;Ambulation;Minimal assistance Toilet Transfer Details (indicate cue type and reason): toilet transfer from recliner>BSC; MIN A for transfer; cues to utilize RW as pt trying to walk without it; cues to reach back to [facility] when sitting         Functional mobility during ADLs: Minimal assistance;Rolling walker General ADL Comments: MIN A for safety; increased assist for inital sit>stand; verbal visual cues for safety     Vision Baseline Vision/History: Wears glasses Patient Visual Report: No change from baseline     Perception     Praxis      Cognition Arousal/Alertness: Awake/alert Behavior During Therapy: WFL for tasks assessed/performed Overall Cognitive Status: History of cognitive impairments - at baseline Area of Impairment: Memory;Following commands;Safety/judgement;Awareness;Problem solving                     Memory: Decreased recall of precautions;Decreased short-term memory Following Commands: Follows one step commands with increased time;Follows multi-step commands with increased time Safety/Judgement:  Decreased awareness of safety;Decreased awareness of deficits Awareness: Emergent Problem Solving: Slow processing;Requires verbal cues;Requires tactile cues General Comments: verbal and tactile cues for safe use of RW and safety with reaching back for [facility]        Exercises     Shoulder Instructions       General Comments ambulated on 2L O2 via [place] and SaO2 dropped to 85%O2, increased  supplemental O2 to 3L SaO2 rebounded to 90%O2, and able to ambulate back to room, once seated in recliner pt able to wean back to 2L O2 via [place] and maintain SaO2 92%O2    Pertinent Vitals/ Pain       Pain Assessment: No/denies pain  Home Living                                          Prior Functioning/Environment              Frequency  Min 2X/week        Progress Toward Goals  OT Goals(current goals can now be found in the care plan section)  Progress towards OT goals: Progressing toward goals  Acute Rehab OT Goals Patient Stated Goal: to not fall OT Goal Formulation: With patient Time For Goal Achievement: 05/30/19 Potential to Achieve Goals: Good  Plan Discharge plan remains appropriate    Co-evaluation                 AM-PAC OT "6 Clicks" Daily Activity     Outcome Measure   Help from another person eating meals?: A Little Help from another person taking care of personal grooming?: A Little Help from another person toileting, which includes using toliet, bedpan, or urinal?: A Little Help from another person bathing (including washing, rinsing, drying)?: A Little Help from another person to put on and taking off regular upper body clothing?: A Little Help from another person to put on and taking off regular lower body clothing?: A Little 6 Click Score: 18    End of Session Equipment Utilized During Treatment: Oxygen;Rolling walker  OT Visit Diagnosis: Unsteadiness on feet (R26.81);Other abnormalities of gait and mobility (R26.89);Muscle weakness (generalized) (M62.81);History of falling (Z91.81);Repeated falls (R29.6);Other symptoms and signs involving cognitive function   Activity Tolerance Patient tolerated treatment well   Patient Left Other (comment)(on BSC- RN Aware with call bell in reach)   Nurse Communication Mobility status;Other (comment)(on BSC)        Time: 1115-5208 OT Time Calculation (min): 12 min  Charges: OT  General Charges $OT Visit: 1 Visit OT Treatments $Self Care/Home Management : 8-22 mins [place], [place] Acute Rehabilitation Services [phone] [place] 05/24/2019, 3:14 PM

## 2019-05-24 NOTE — Progress Notes (Signed)
PROGRESS NOTE    Kim Mullins  RXV:[id number] DOB: 1930/01/18 DOA: 05/14/2019 PCP: Lin Landsman, MD   Brief Narrative:  Kim Mullins an 85 y.o.femalewith medical history significant ofhypertension, diet-controlled diabetes, asthma, on 2 to 3 L nasal cannula oxygen at home, stroke, GERD, OSA on CPAP, complete heart block,s/p ofpacemaker placement, CHF with EF 35%, CKD-III, who presentedwith generalized weaknessanddarkemesis. Patient had generalized weakness.She also has decreased appetite and poor oral intake recently.  Cardiology was consulted and was following. Consulted palliative care for goals of care discussion due to multiple comorbidities advanced CHF,palliative care recommended outpatient follow-up.PT/OT recommended skilled facility placement.  Social worker consulted.Her insurance denied SNF.Pending placement.  Assessment & Plan:   Principal Problem:   Hematemesis Active Problems:   Benign essential HTN   Pacemaker   History of stroke   Atrial fibrillation (HCC)   GERD (gastroesophageal reflux disease)   Chronic systolic CHF (congestive heart failure) (HCC)   Acute renal failure superimposed on stage 3 chronic kidney disease (HCC)   Fall   Hyponatremia   Hyperkalemia   FTT (failure to thrive) in adult   Cardiac amyloidosis (HCC)   FTT/contsitpation/hypoglycemia -Does not eat much,has poor appetitte -blood sugarswas low previously. Now improving. Continue Accu-Cheks. -Continue dysphagia 3 diet  ReportedHematemesis:per hergranddaughter, patient had 5 episodes of coffee-ground emesis.Her hemoglobin is stable. -Since hemoglobin stable ,restartedEliquis -Continue protonix -suspect vomiting was from gastroparesis - Zofran for nausea - Avoid NSAIDs and SQ heparin -had EGDin 2015 05/19/2019: No new complaints.  No hematemesis.  Hypokalemia:  -Supplemented and corrected  JKD:TOIZT pressures are soft -Initially held Bumex  due to worsening renal function. -Now resumed.  Continue to monitor blood pressure closely and adjust medications as needed.  Atrial Fibrillation: CHA2DS2-VASc Scoreis 8, needs oral anticoagulation. Patient is on Eliquis at home. Heart rate is well controlled. -Since hemoglobin stable,restartedEliquis.  History of stroke: -Resumedeliquis.Hgb stable  GERD (gastroesophageal reflux disease): -on protonix  Chronic systolic CHF (congestive heart failure) (HCC):2D echo 07/21/2018 showed EF of 35-40%. Patient does not have peripheral edema.  -Initially held diuretics (Bumex and metolazone) due to worsening renal function -Now Bumex started.  Heart failure was following. Palliative care consult requested to discuss goals of care.  Recommended outpatient follow-up with palliative  care.  Remains full code -She is on 2 Litres of oxygen at home  Acute renal failure superimposed on stage 3 chronic kidney disease (HCC):Baseline creatinine 1.2-1.3. Kidney function near baseline  Fall: CT-head negative foracute intracranial abnormalities. -PT/OT  Hyponatremia:Stable  Debility/Deconditioning: PT recommended skilled nursing facility.  Insurance denied.  Social worker following.  PT/OT evaluations performed. SNF recommended. SNF appropriate as the patient has received 3 days of hospital care (or the 3 day period has been waived by the patient's insurance company) and is felt to need rehab services to restore this patient to their prior level of function to achieve safe transition back to home care. This patient needs rehab services for at least 5 days per week and skilled nursing services daily to facilitate this transition. Rehab is being requested as the most appropriate d/c option for this patient and is NOT felt to be for custodial care as evidenced by her good functional status at home.   Nutrition Problem: Increased nutrient needs Etiology: chronic illness      DVT  prophylaxis:Eliquis Code Status: Full Family Communication: Grand daughter on 05/22/19 Disposition Plan: Home with home health vs SNF. Insurance has denied but family appealed  Consultants: Cardiology  Procedures:None  Antimicrobials:  Anti-infectives (From admission, onward)   None      Subjective:  Patient seen and examined the bedside this morning.  Hemodynamically stable.  No new acute issues.  Comfortable.  Complains of constipation.  Magnesium citrate ordered   Objective: Vitals:   05/23/19 1546 05/23/19 2123 05/24/19 0131 05/24/19 0549  BP: 90/70 92/71  94/73  Pulse:  60  81  Resp: 20 19  20   Temp:  98 F (36.7 C)  98.1 F (36.7 C)  TempSrc:  Oral  Oral  SpO2: 98% 100%  100%  Weight:   58.8 kg   Height:        Intake/Output Summary (Last 24 hours) at 05/24/2019 1312 Last data filed at 05/23/2019 2100 Gross per 24 hour  Intake 240 ml  Output -  Net 240 ml   Filed Weights   05/22/19 0519 05/23/19 0355 05/24/19 0131  Weight: 57.9 kg 58.5 kg 58.8 kg    Examination:   General exam: Appears calm and comfortable ,Not in distress, elderly deconditioned female  HEENT:PERRL,Oral mucosa moist, Ear/Nose normal on gross exam Respiratory system: Bilateral equal air entry, normal vesicular breath sounds, no wheezes or crackles  Cardiovascular system: S1 & S2 heard, RRR. No JVD, murmurs, rubs, gallops or clicks. Gastrointestinal system: Abdomen is nondistended, soft and nontender. No organomegaly or masses felt. Normal bowel sounds heard. Central nervous system: Alert and oriented. No focal neurological deficits. Extremities: No edema, no clubbing ,no cyanosis, distal peripheral pulses palpable. Skin: No rashes, lesions or ulcers,no icterus ,no pallor  Data Reviewed: I have personally reviewed following labs and imaging studies  CBC: Recent Labs  Lab 05/19/19 0437 05/22/19 0324  WBC 3.3* 3.5*  NEUTROABS  --  2.4  HGB 13.6 12.9  HCT 41.7 39.0  MCV 89.5 89.4   PLT 143* 222*   Basic Metabolic Panel: Recent Labs  Lab 05/18/19 0230 05/19/19 0437 05/22/19 0324  NA 132* 131* 130*  K 3.2* 4.6 4.1  CL 98 98 96*  CO2 20* 17* 22  GLUCOSE 130* 110* 164*  BUN 41* 38* 45*  CREATININE 1.48* 1.40* 1.49*  CALCIUM 8.3* 8.4* 8.2*   GFR: Estimated Creatinine Clearance: 22.4 mL/min (A) (by C-G formula based on SCr of 1.49 mg/dL (H)). Liver Function Tests: No results for input(s): AST, ALT, ALKPHOS, BILITOT, PROT, ALBUMIN in the last 168 hours. No results for input(s): LIPASE, AMYLASE in the last 168 hours. No results for input(s): AMMONIA in the last 168 hours. Coagulation Profile: No results for input(s): INR, PROTIME in the last 168 hours. Cardiac Enzymes: No results for input(s): CKTOTAL, CKMB, CKMBINDEX, TROPONINI in the last 168 hours. BNP (last 3 results) No results for input(s): PROBNP in the last 8760 hours. HbA1C: No results for input(s): HGBA1C in the last 72 hours. CBG: Recent Labs  Lab 05/23/19 1146 05/23/19 1632 05/23/19 2121 05/24/19 0732 05/24/19 1155  GLUCAP 199* 156* 138* 109* 192*   Lipid Profile: No results for input(s): CHOL, HDL, LDLCALC, TRIG, CHOLHDL, LDLDIRECT in the last 72 hours. Thyroid Function Tests: No results for input(s): TSH, T4TOTAL, FREET4, T3FREE, THYROIDAB in the last 72 hours. Anemia Panel: No results for input(s): VITAMINB12, FOLATE, FERRITIN, TIBC, IRON, RETICCTPCT in the last 72 hours. Sepsis Labs: No results for input(s): PROCALCITON, LATICACIDVEN in the last 168 hours.  Recent Results (from the past 240 hour(s))  SARS CORONAVIRUS 2 (TAT 6-24 HRS) Nasopharyngeal Nasopharyngeal Swab     Status: None   Collection Time: 05/14/19 10:10 PM  Specimen: Nasopharyngeal Swab  Result Value Ref Range Status   SARS Coronavirus 2 NEGATIVE NEGATIVE Final    Comment: (NOTE) SARS-CoV-2 target nucleic acids are NOT DETECTED. The SARS-CoV-2 RNA is generally detectable in upper and lower respiratory specimens  during the acute phase of infection. Negative results do not preclude SARS-CoV-2 infection, do not rule out co-infections with other pathogens, and should not be used as the sole basis for treatment or other patient management decisions. Negative results must be combined with clinical observations, patient history, and epidemiological information. The expected result is Negative. Fact Sheet for Patients: SugarRoll.be Fact Sheet for Healthcare Providers: https://www.woods-mathews.com/ This test is not yet approved or cleared by the [place] FDA and  has been authorized for detection and/or diagnosis of SARS-CoV-2 by FDA under an Emergency Use Authorization (EUA). This EUA will remain  in effect (meaning this test can be used) for the duration of the COVID-19 declaration under Section 56 4(b)(1) of the Act, 21 U.S.C. section 360bbb-3(b)(1), unless the authorization is terminated or revoked sooner. Performed at [facility], [place] [address], [place], [place] [address]          Radiology Studies: No results found.      Scheduled Meds: . apixaban  2.5 mg Oral BID  . bumetanide  4 mg Oral BID  . dextrose  50 mL Intravenous Once  . feeding supplement (ENSURE ENLIVE)  237 mL Oral BID BM  . latanoprost  1 drop Both Eyes QHS  . loratadine  10 mg Oral Daily  . magnesium oxide  400 mg Oral Daily  . pantoprazole  40 mg Oral BID  . polyethylene glycol  17 g Oral Daily  . senna  1 tablet Oral QHS  . simethicone  80 mg Oral QID  . timolol  1 drop Both Eyes Daily   Continuous Infusions:   LOS: 9 days    Time spent: 35 mins.More than 50% of that time was spent in counseling and/or coordination of care.      Shelly Coss, MD [facility] Pager [phone]  If 7PM-7AM, please contact night-coverage [email or web address] Password [email or web address] 05/24/2019, 1:12 PM

## 2019-05-24 NOTE — Progress Notes (Signed)
Physical Therapy Treatment Patient Details Name: Kim Mullins MRN: [id number] DOB: 06-Oct-1929 Today's Date: 05/24/2019    History of Present Illness 85yo female presentijng with general weakness, coffee ground emesis. Recent fall. PMH HTN, DM, asthma on home O2, CVA, complete heart block s/p ICD, CHF with EF 35%, CKD stage III, A-fib, hx tibial fx, cardiac cath, THR, total shoulder arthroscopy    PT Comments    Pt asleep in recliner on entry, easily roused and agreeable to ambulation with therapy. Pt requires minA for power up from recliner to [place] for steadying with RW for ambulation requiring standing rest break and increase in supplemental O2 from 2 to 3L via [place], due to oxygen desaturation to 85%O2. Pt visibly fatigued after short distance ambulation. Prior to hospitalization pt was complete independent. Able to ambulate in grocery store and perform iADLs. Pt is motivate to return to PLOF and is frustrated with her decline during hospitalization. PT continues to recommend SNF level rehab so that she can quickly regain her mobility and independence to ultimately return to her home environment.    Follow Up Recommendations  SNF;Supervision/Assistance - 24 hour(if patient/family refuse SNF, she will require HHPT and 24/7 physical assist/S)     Equipment Recommendations  Rolling walker with 5" wheels;3in1 (PT)       Precautions / Restrictions Precautions Precautions: Fall;ICD/Pacemaker Restrictions Weight Bearing Restrictions: No    Mobility  Bed Mobility               General bed mobility comments: OOB in recliner on entry   Transfers Overall transfer level: Needs assistance Equipment used: Rolling walker (2 wheeled);1 person hand held assist Transfers: Sit to/from Stand;Stand Pivot Transfers Sit to Stand: Min assist         General transfer comment: minA for power up and steadying in RW  Ambulation/Gait Ambulation/Gait assistance: Min assist Gait  Distance (Feet): 80 Feet(2x40') Assistive device: Rolling walker (2 wheeled) Gait Pattern/deviations: Step-through pattern;Decreased step length - right;Decreased step length - left;Decreased dorsiflexion - right;Decreased dorsiflexion - left;Decreased stride length;Drifts right/left;Trunk flexed Gait velocity: slowed   General Gait Details: minA for steadying with RW, pt with bilateral knee buckling, no LoB, requires 1x standing rest break          Balance Overall balance assessment: Needs assistance;History of Falls Sitting-balance support: Bilateral upper extremity supported;Feet supported Sitting balance-Leahy Scale: Fair     Standing balance support: Bilateral upper extremity supported;During functional activity Standing balance-Leahy Scale: Fair Standing balance comment: able to static stand at [facility] requires UE support for dynamic balance                            Cognition Arousal/Alertness: Awake/alert   Overall Cognitive Status: History of cognitive impairments - at baseline Area of Impairment: Memory;Following commands;Safety/judgement;Awareness;Problem solving                     Memory: Decreased recall of precautions;Decreased short-term memory Following Commands: Follows one step commands with increased time;Follows multi-step commands with increased time Safety/Judgement: Decreased awareness of safety;Decreased awareness of deficits Awareness: Emergent Problem Solving: Slow processing;Requires verbal cues;Requires tactile cues General Comments: verbal and tactile cues for safe use of RW          General Comments General comments (skin integrity, edema, etc.): ambulated on 2L O2 via [place] and SaO2 dropped to 85%O2, increased supplemental O2 to 3L SaO2 rebounded to 90%O2, and able to  ambulate back to room, once seated in recliner pt able to wean back to 2L O2 via [place] and maintain SaO2 92%O2      Pertinent Vitals/Pain Pain Assessment: No/denies  pain           PT Goals (current goals can now be found in the care plan section) Acute Rehab PT Goals Patient Stated Goal: to not fall PT Goal Formulation: Patient unable to participate in goal setting Time For Goal Achievement: 05/29/19 Potential to Achieve Goals: Fair    Frequency    Min 2X/week      PT Plan Current plan remains appropriate       AM-PAC PT "6 Clicks" Mobility   Outcome Measure  Help needed turning from your back to your side while in a flat bed without using bedrails?: A Little Help needed moving from lying on your back to sitting on the side of a flat bed without using bedrails?: A Little Help needed moving to and from a bed to a chair (including a wheelchair)?: A Lot Help needed standing up from a chair using your arms (e.g., wheelchair or bedside chair)?: A Lot Help needed to walk in hospital room?: A Lot Help needed climbing 3-5 steps with a railing? : A Lot 6 Click Score: 14    End of Session Equipment Utilized During Treatment: Gait belt;Oxygen Activity Tolerance: Patient tolerated treatment well Patient left: in chair;with call bell/phone within reach Nurse Communication: Mobility status PT Visit Diagnosis: Unsteadiness on feet (R26.81);History of falling (Z91.81);Muscle weakness (generalized) (M62.81);Difficulty in walking, not elsewhere classified (R26.2);Other abnormalities of gait and mobility (R26.89)     Time: 6761-9509 PT Time Calculation (min) (ACUTE ONLY): 21 min  Charges:  $Gait Training: 8-22 mins                     Kania Regnier B. Migdalia Dk PT, DPT Acute Rehabilitation Services Pager [phone] Office [phone]    [place] 05/24/2019, 2:23 PM

## 2019-05-25 ENCOUNTER — Inpatient Hospital Stay
Admission: RE | Admit: 2019-05-25 | Discharge: 2019-06-10 | Disposition: A | Discharge status: Home / Self Care | Payer: Medicare HMO | Source: Ambulatory Visit | Attending: Internal Medicine | Admitting: Internal Medicine

## 2019-05-25 DIAGNOSIS — K920 Hematemesis: Secondary | ICD-10-CM | POA: Diagnosis not present

## 2019-05-25 DIAGNOSIS — G4733 Obstructive sleep apnea (adult) (pediatric): Secondary | ICD-10-CM | POA: Diagnosis not present

## 2019-05-25 DIAGNOSIS — H401130 Primary open-angle glaucoma, bilateral, stage unspecified: Secondary | ICD-10-CM | POA: Diagnosis not present

## 2019-05-25 DIAGNOSIS — J9601 Acute respiratory failure with hypoxia: Secondary | ICD-10-CM | POA: Diagnosis not present

## 2019-05-25 DIAGNOSIS — K219 Gastro-esophageal reflux disease without esophagitis: Secondary | ICD-10-CM | POA: Diagnosis not present

## 2019-05-25 DIAGNOSIS — I4821 Permanent atrial fibrillation: Secondary | ICD-10-CM | POA: Diagnosis not present

## 2019-05-25 DIAGNOSIS — I5022 Chronic systolic (congestive) heart failure: Secondary | ICD-10-CM | POA: Diagnosis not present

## 2019-05-25 DIAGNOSIS — E854 Organ-limited amyloidosis: Secondary | ICD-10-CM | POA: Diagnosis not present

## 2019-05-25 DIAGNOSIS — J9611 Chronic respiratory failure with hypoxia: Secondary | ICD-10-CM | POA: Diagnosis not present

## 2019-05-25 DIAGNOSIS — R627 Adult failure to thrive: Secondary | ICD-10-CM | POA: Diagnosis not present

## 2019-05-25 DIAGNOSIS — J45909 Unspecified asthma, uncomplicated: Secondary | ICD-10-CM | POA: Diagnosis not present

## 2019-05-25 DIAGNOSIS — N170 Acute kidney failure with tubular necrosis: Secondary | ICD-10-CM | POA: Diagnosis not present

## 2019-05-25 DIAGNOSIS — R0600 Dyspnea, unspecified: Secondary | ICD-10-CM | POA: Diagnosis not present

## 2019-05-25 DIAGNOSIS — I639 Cerebral infarction, unspecified: Secondary | ICD-10-CM | POA: Diagnosis not present

## 2019-05-25 DIAGNOSIS — I509 Heart failure, unspecified: Secondary | ICD-10-CM | POA: Diagnosis not present

## 2019-05-25 DIAGNOSIS — I130 Hypertensive heart and chronic kidney disease with heart failure and stage 1 through stage 4 chronic kidney disease, or unspecified chronic kidney disease: Secondary | ICD-10-CM | POA: Diagnosis not present

## 2019-05-25 DIAGNOSIS — I10 Essential (primary) hypertension: Secondary | ICD-10-CM | POA: Diagnosis not present

## 2019-05-25 DIAGNOSIS — N183 Chronic kidney disease, stage 3 unspecified: Secondary | ICD-10-CM | POA: Diagnosis not present

## 2019-05-25 DIAGNOSIS — I442 Atrioventricular block, complete: Secondary | ICD-10-CM | POA: Diagnosis not present

## 2019-05-25 DIAGNOSIS — I480 Paroxysmal atrial fibrillation: Secondary | ICD-10-CM | POA: Diagnosis not present

## 2019-05-25 DIAGNOSIS — E1129 Type 2 diabetes mellitus with other diabetic kidney complication: Secondary | ICD-10-CM | POA: Diagnosis not present

## 2019-05-25 DIAGNOSIS — N184 Chronic kidney disease, stage 4 (severe): Secondary | ICD-10-CM | POA: Diagnosis not present

## 2019-05-25 LAB — CBC WITH DIFFERENTIAL/PLATELET
Abs Immature Granulocytes: 0.02 10*3/uL (ref 0.00–0.07)
Basophils Absolute: 0.1 10*3/uL (ref 0.0–0.1)
Basophils Relative: 2 %
Eosinophils Absolute: 0.2 10*3/uL (ref 0.0–0.5)
Eosinophils Relative: 5 %
HCT: 38.2 % (ref 36.0–46.0)
Hemoglobin: 12.4 g/dL (ref 12.0–15.0)
Immature Granulocytes: 1 %
Lymphocytes Relative: 18 %
Lymphs Abs: 0.6 10*3/uL — ABNORMAL LOW (ref 0.7–4.0)
MCH: 29 pg (ref 26.0–34.0)
MCHC: 32.5 g/dL (ref 30.0–36.0)
MCV: 89.5 fL (ref 80.0–100.0)
Monocytes Absolute: 0.2 10*3/uL (ref 0.1–1.0)
Monocytes Relative: 6 %
Neutro Abs: 2.4 10*3/uL (ref 1.7–7.7)
Neutrophils Relative %: 68 %
Platelets: 121 10*3/uL — ABNORMAL LOW (ref 150–400)
RBC: 4.27 MIL/uL (ref 3.87–5.11)
RDW: 17.3 % — ABNORMAL HIGH (ref 11.5–15.5)
WBC: 3.4 10*3/uL — ABNORMAL LOW (ref 4.0–10.5)
nRBC: 0 % (ref 0.0–0.2)

## 2019-05-25 LAB — BASIC METABOLIC PANEL
Anion gap: 11 (ref 5–15)
BUN: 48 mg/dL — ABNORMAL HIGH (ref 8–23)
CO2: 24 mmol/L (ref 22–32)
Calcium: 8.5 mg/dL — ABNORMAL LOW (ref 8.9–10.3)
Chloride: 94 mmol/L — ABNORMAL LOW (ref 98–111)
Creatinine, Ser: 1.81 mg/dL — ABNORMAL HIGH (ref 0.44–1.00)
GFR calc Af Amer: 29 mL/min — ABNORMAL LOW (ref >60)
GFR calc non Af Amer: 25 mL/min — ABNORMAL LOW (ref >60)
Glucose, Bld: 160 mg/dL — ABNORMAL HIGH (ref 70–99)
Potassium: 4.7 mmol/L (ref 3.5–5.1)
Sodium: 129 mmol/L — ABNORMAL LOW (ref 135–145)

## 2019-05-25 MED ORDER — SENNA 8.6 MG PO TABS: 1.0000 | ORAL_TABLET | Freq: Every day | ORAL | 0 refills | Status: DC

## 2019-05-25 MED ORDER — BUMETANIDE 2 MG PO TABS: 4.0000 mg | ORAL_TABLET | Freq: Two times a day (BID) | ORAL | Status: DC

## 2019-05-25 MED ORDER — PANTOPRAZOLE SODIUM 40 MG PO TBEC: 40.0000 mg | DELAYED_RELEASE_TABLET | Freq: Every day | ORAL | Status: DC

## 2019-05-25 MED ORDER — POLYETHYLENE GLYCOL 3350 17 G PO PACK: 17.0000 g | PACK | Freq: Every day | ORAL | 0 refills | Status: DC

## 2019-05-25 NOTE — TOC Transition Note (Signed)
Transition of Care [facility]) - CM/SW Discharge Note   Patient Details  Name: Angles Trevizo MRN: [id number] Date of Birth: [birth year]  Transition of [facility]) CM/SW Contact:  Gelene Mink, [place] Phone Number: 05/25/2019, 3:50 PM   Clinical Narrative:     Patient will DC to: [facility] Anticipated DC date: 05/25/2019 Family notified: Yes Transport by: Family   Per MD patient ready for DC to . RN, patient, patient's family, and facility notified of DC. Discharge Summary and FL2 sent to facility. RN to call report prior to discharge [phone]). DC packet on chart.   CSW will sign off for now as social work intervention is no longer needed. Please consult [place] again if new needs arise.  Chisom Muntean, LCSW-A [place]/Clinical Social Work Department Cell: [phone]    Final next level of care: [place] Barriers to Discharge: No Barriers Identified   Patient Goals and CMS Choice   CMS Medicare.gov Compare Post Acute Care list provided to:: Patient Represenative (must comment) Choice offered to / list presented to : Adult Children  Discharge Placement   Existing PASRR number confirmed : 05/16/19          Patient [facility] bed at: [facility] Patient to be transferred to facility by: Family Name of family member notified: [name] Patient and family notified of of transfer: 05/25/19  Discharge Plan and Services   Discharge Planning Services: NA            DME Arranged: N/A DME Agency: NA       HH Arranged: NA HH Agency: NA        Social Determinants of Health ([place]) Interventions     Readmission Risk Interventions No flowsheet data found.

## 2019-05-25 NOTE — Discharge Summary (Signed)
Physician Discharge Summary  Kim Mullins HWE:[id number] DOB: 03-Oct-1929 DOA: 05/14/2019  PCP: Lin Landsman, MD  Admit date: 05/14/2019 Discharge date: 05/25/2019  Admitted From: Home Disposition:  SNF Discharge Condition:Stable CODE STATUS:FULL Diet recommendation: Dyshagia 3 diet  Brief/Interim Summary:  Kim Dicarlo Wilsonis an 85 y.o.femalewith medical history significant ofhypertension, diet-controlled diabetes, asthma, on 2 to 3 L nasal cannula oxygen at home, stroke, GERD, OSA on CPAP, complete heart block,s/p ofpacemaker placement, CHF with EF 35%, CKD-III, who presentedwith generalized weaknessanddarkemesis. Patient had generalized weakness.She also has decreased appetite and poor oral intake recently.  Cardiology was consulted and was following. Consulted palliative care for goals of care discussion due to multiple comorbidities advanced CHF,palliative care recommended outpatient follow-up.PT/OT recommended skilled facility placement.  Social worker consulted.She is hemodynamically stable for discharge to skilled nursing facility today.  Following problems were addressed during hospitalization:  FTT/contsitpation/hypoglycemia -Does not eat much,has poor appetitte -blood sugarswas low previously. Now improving. Continue Accu-Cheks. -Continue dysphagia 3 diet  ReportedHematemesis:per hergranddaughter, patient had 5 episodes of coffee-ground emesis.Her hemoglobin is stable. -Since hemoglobin stable ,restartedEliquis -Continue protonix -suspect vomiting was from gastroparesis - Zofran for nausea - Avoid NSAIDs and SQ heparin -had EGDin 2015 05/19/2019: No new complaints. No hematemesis.  Hypokalemia:  -Supplemented and corrected  ELF:YBOFB pressures are soft -Initially held Bumex due to worsening renal function. -Now resumed.   Atrial Fibrillation: CHA2DS2-VASc Scoreis 8, needs oral anticoagulation. Patient is on Eliquis at home.  Heart rate is well controlled. -Since hemoglobin stable,restartedEliquis.  History of stroke: -Resumedeliquis.Hgb stable  GERD (gastroesophageal reflux disease): -on protonix  Chronic systolic CHF (congestive heart failure) (HCC):2D echo 07/21/2018 showed EF of 35-40%. Patient does not have peripheral edema.  -Initially held diuretics (Bumex and metolazone) due to worsening renal function -Now Bumex started. Heart failure was following. Palliative care consult requested to discuss goals of care.  Recommended outpatient follow-up with palliative  care.  Remains full code -She is on 2 Litres of oxygen at home  Acute renal failure superimposed on stage 3 chronic kidney disease (HCC):Baseline creatinine 1.2-1.3. Kidney function near baseline  Fall: CT-head negative foracute intracranial abnormalities. -PT/OT recommended SNF  Hyponatremia:Stable   Discharge Diagnoses:  Principal Problem:   Hematemesis Active Problems:   Benign essential HTN   Pacemaker   History of stroke   Atrial fibrillation (HCC)   GERD (gastroesophageal reflux disease)   Chronic systolic CHF (congestive heart failure) (HCC)   Acute renal failure superimposed on stage 3 chronic kidney disease (HCC)   Fall   Hyponatremia   Hyperkalemia   FTT (failure to thrive) in adult   Cardiac amyloidosis [facility])    Discharge Instructions  Discharge Instructions    Diet - low sodium heart healthy   Complete by: As directed    Discharge instructions   Complete by: As directed    1)Please take prescribed medications as instructed. 2)Do a BMP test in a week.   Increase activity slowly   Complete by: As directed      Allergies as of 05/25/2019      Reactions   Hydrocodone Other (See Comments)   Took away all energy and made her stop eating food for short time   Percocet [oxycodone-acetaminophen] Other (See Comments)   Too away all energy and made her stop eating food for short time   Eggs Or  Egg-derived Products Hives, Rash   Mercurial Derivatives Itching, Rash   Penicillins Hives, Rash   Has patient had a PCN reaction causing immediate rash,  facial/tongue/throat swelling, SOB or lightheadedness with hypotension: Yes Has patient had a PCN reaction causing severe rash involving mucus membranes or skin necrosis: No Has patient had a PCN reaction that required hospitalization: No Has patient had a PCN reaction occurring within the last 10 years: Yes If all of the above answers are "NO", then may proceed with Cephalosporin use.   Quinine Derivatives Hives, Rash      Medication List    STOP taking these medications   metolazone 2.5 MG tablet Commonly known as: ZAROXOLYN   potassium chloride SA 20 MEQ tablet Commonly known as: K-DUR     TAKE these medications   apixaban 2.5 MG Tabs tablet Commonly known as: Eliquis Take 1 tablet (2.5 mg total) by mouth 2 (two) times daily.   bumetanide 2 MG tablet Commonly known as: BUMEX Take 2 tablets (4 mg total) by mouth 2 (two) times daily. What changed:   medication strength  how much to take  additional instructions   diclofenac sodium 1 % Gel Commonly known as: VOLTAREN Apply 2 g topically 3 (three) times daily as needed (pain, inflammation).   fexofenadine 180 MG tablet Commonly known as: ALLEGRA Take 180 mg by mouth daily.   glucose blood test strip Use to test blood sugar twice daily ([employer] Smartview)   latanoprost 0.005 % ophthalmic solution Commonly known as: XALATAN Place 1 drop into both eyes at bedtime.   OXYGEN Inhale 2 L into the lungs continuous.   pantoprazole 40 MG tablet Commonly known as: PROTONIX Take 1 tablet (40 mg total) by mouth daily.   polyethylene glycol 17 g packet Commonly known as: MIRALAX / GLYCOLAX Take 17 g by mouth daily. Start taking on: May 26, 2019   senna 8.6 MG Tabs tablet Commonly known as: SENOKOT Take 1 tablet (8.6 mg total) by mouth at bedtime.   timolol  0.5 % ophthalmic solution Commonly known as: TIMOPTIC Place 1 drop into both eyes at bedtime.   Tylenol 325 MG Caps Generic drug: Acetaminophen Take 650 mg by mouth daily as needed (for shoulder pain).      Contact information for after-discharge care    [facility] .   Service: Skilled Nursing Contact information: [address] [place] [address] [phone]             Allergies  Allergen Reactions  . Hydrocodone Other (See Comments)    Took away all energy and made her stop eating food for short time  . Percocet [Oxycodone-Acetaminophen] Other (See Comments)    Too away all energy and made her stop eating food for short time  . Eggs Or Egg-Derived Products Hives and Rash  . Mercurial Derivatives Itching and Rash  . Penicillins Hives and Rash    Has patient had a PCN reaction causing immediate rash, facial/tongue/throat swelling, SOB or lightheadedness with hypotension: Yes Has patient had a PCN reaction causing severe rash involving mucus membranes or skin necrosis: No Has patient had a PCN reaction that required hospitalization: No Has patient had a PCN reaction occurring within the last 10 years: Yes If all of the above answers are "NO", then may proceed with Cephalosporin use.  . Quinine Derivatives Hives and Rash    Consultations: Cardiology   Procedures/Studies: Ct Abdomen Pelvis Wo Contrast  Result Date: 05/14/2019 CLINICAL DATA:  Nausea and vomiting EXAM: CT ABDOMEN AND PELVIS WITHOUT CONTRAST TECHNIQUE: Multidetector CT imaging of the abdomen and pelvis was performed  following the standard protocol without IV contrast. COMPARISON:  02/09/2014 FINDINGS: Lower chest: No focal infiltrate or sizable effusion is seen. Heart is enlarged in size. Pacing device is noted. Hepatobiliary: No focal liver abnormality is seen. No gallstones, gallbladder wall thickening, or biliary dilatation. Pancreas:  Unremarkable. No pancreatic ductal dilatation or surrounding inflammatory changes. Spleen: Normal in size without focal abnormality. Adrenals/Urinary Tract: Adrenal glands are within normal limits. Large upper pole renal cyst is noted on the right stable from the prior exam. No obstructive changes are seen. Atrophy of the lower pole is noted new from the prior exam likely related to prior infarct. Right kidney demonstrates no renal calculi or obstructive changes. The bladder is well distended. Stomach/Bowel: Scattered diverticular changes noted without evidence of diverticulitis. The appendix is not well visualized. No inflammatory changes to suggest appendicitis are noted. No gastric or small bowel abnormality is noted. Vascular/Lymphatic: Aortic atherosclerosis. No enlarged abdominal or pelvic lymph nodes. Reproductive: Status post hysterectomy. No adnexal masses. Other: No hernia is identified.  Mild changes of anasarca are noted. Musculoskeletal: Right hip replacement is noted. Degenerative changes of lumbar spine are seen. IMPRESSION: Lower pole renal atrophy on the left consistent with prior infarct. Mild changes of anasarca. Diverticulosis without diverticulitis. Electronically Signed   By: Inez Catalina M.D.   On: 05/14/2019 22:56   Ct Head Wo Contrast  Result Date: 05/14/2019 CLINICAL DATA:  Headache emesis EXAM: CT HEAD WITHOUT CONTRAST TECHNIQUE: Contiguous axial images were obtained from the base of the skull through the vertex without intravenous contrast. COMPARISON:  CT brain 01/07/2017 FINDINGS: Brain: No acute territorial infarction, hemorrhage or intracranial mass. Atrophy and mild small vessel ischemic changes of the white matter. Probable chronic lacunar infarcts in the basal ganglia. Stable ventricle size Vascular: No hyperdense vessels.  Carotid vascular calcification Skull: Normal. Negative for fracture or focal lesion. Sinuses/Orbits: Mucosal thickening in the ethmoid sinuses Other: None  IMPRESSION: 1. No CT evidence for acute intracranial abnormality. 2. Atrophy and small vessel ischemic changes of the white matter Electronically Signed   By: Donavan Foil M.D.   On: 05/14/2019 22:51   Dg Chest Portable 1 View  Result Date: 05/14/2019 CLINICAL DATA:  Shortness of breath EXAM: PORTABLE CHEST 1 VIEW COMPARISON:  08/17/2018 FINDINGS: Left-sided multi lead pacing device. Cardiomegaly with aortic atherosclerosis. No consolidation or effusion. No focal airspace disease. IMPRESSION: No active disease.  Stable cardiomegaly. Electronically Signed   By: Donavan Foil M.D.   On: 05/14/2019 17:00       Subjective:  Patient seen and examined the bedside this morning.  hemodynamically stable for discharge.  Discharge Exam: Vitals:   05/24/19 2040 05/25/19 0528  BP: 91/69 92/71  Pulse: 61 67  Resp: 19 16  Temp: 98 F (36.7 C) 98.1 F (36.7 C)  SpO2: 95% 98%   Vitals:   05/24/19 1341 05/24/19 1459 05/24/19 2040 05/25/19 0528  BP: 97/73  91/69 92/71  Pulse: 60  61 67  Resp: 15  19 16   Temp: (!) 97.5 F (36.4 C)  98 F (36.7 C) 98.1 F (36.7 C)  TempSrc: Oral  Oral Oral  SpO2: 100% 97% 95% 98%  Weight:    60.1 kg  Height:        General: Pt is alert, awake, not in acute distress Cardiovascular: RRR, S1/S2 +, no rubs, no gallops Respiratory: CTA bilaterally, no wheezing, no rhonchi Abdominal: Soft, NT, ND, bowel sounds + Extremities: no edema, no cyanosis    The results  of significant diagnostics from this hospitalization (including imaging, microbiology, ancillary and laboratory) are listed below for reference.     Microbiology: No results found for this or any previous visit (from the past 240 hour(s)).   Labs: BNP (last 3 results) Recent Labs    08/17/18 1332 10/09/18 1238 05/15/19 0516  BNP 2,006.0* 1,424.7* 7,341.9*   Basic Metabolic Panel: Recent Labs  Lab 05/19/19 0437 05/22/19 0324 05/25/19 0330  NA 131* 130* 129*  K 4.6 4.1 4.7  CL 98 96*  94*  CO2 17* 22 24  GLUCOSE 110* 164* 160*  BUN 38* 45* 48*  CREATININE 1.40* 1.49* 1.81*  CALCIUM 8.4* 8.2* 8.5*   Liver Function Tests: No results for input(s): AST, ALT, ALKPHOS, BILITOT, PROT, ALBUMIN in the last 168 hours. No results for input(s): LIPASE, AMYLASE in the last 168 hours. No results for input(s): AMMONIA in the last 168 hours. CBC: Recent Labs  Lab 05/19/19 0437 05/22/19 0324 05/25/19 0330  WBC 3.3* 3.5* 3.4*  NEUTROABS  --  2.4 2.4  HGB 13.6 12.9 12.4  HCT 41.7 39.0 38.2  MCV 89.5 89.4 89.5  PLT 143* 123* 121*   Cardiac Enzymes: No results for input(s): CKTOTAL, CKMB, CKMBINDEX, TROPONINI in the last 168 hours. BNP: Invalid input(s): POCBNP CBG: Recent Labs  Lab 05/23/19 2121 05/24/19 0732 05/24/19 1155 05/24/19 1551 05/24/19 2112  GLUCAP 138* 109* 192* 159* 118*   D-Dimer No results for input(s): DDIMER in the last 72 hours. Hgb A1c No results for input(s): HGBA1C in the last 72 hours. Lipid Profile No results for input(s): CHOL, HDL, LDLCALC, TRIG, CHOLHDL, LDLDIRECT in the last 72 hours. Thyroid function studies No results for input(s): TSH, T4TOTAL, T3FREE, THYROIDAB in the last 72 hours.  Invalid input(s): FREET3 Anemia work up No results for input(s): VITAMINB12, FOLATE, FERRITIN, TIBC, IRON, RETICCTPCT in the last 72 hours. Urinalysis    Component Value Date/Time   COLORURINE YELLOW 05/14/2019 2105   APPEARANCEUR CLEAR 05/14/2019 2105   LABSPEC 1.010 05/14/2019 2105   PHURINE 8.0 05/14/2019 2105   GLUCOSEU NEGATIVE 05/14/2019 2105   GLUCOSEU NEGATIVE 07/10/2014 1517   HGBUR NEGATIVE 05/14/2019 2105   BILIRUBINUR NEGATIVE 05/14/2019 2105   KETONESUR NEGATIVE 05/14/2019 2105   PROTEINUR NEGATIVE 05/14/2019 2105   UROBILINOGEN 0.2 10/06/2014 1552   NITRITE NEGATIVE 05/14/2019 2105   LEUKOCYTESUR SMALL (A) 05/14/2019 2105   Sepsis Labs Invalid input(s): PROCALCITONIN,  WBC,  LACTICIDVEN Microbiology No results found for this  or any previous visit (from the past 240 hour(s)).  Please note: You were cared for by a hospitalist during your hospital stay. Once you are discharged, your primary care physician will handle any further medical issues. Please note that NO REFILLS for any discharge medications will be authorized once you are discharged, as it is imperative that you return to your primary care physician (or establish a relationship with a primary care physician if you do not have one) for your post hospital discharge needs so that they can reassess your need for medications and monitor your lab values.    Time coordinating discharge: 40 minutes  SIGNED:   Shelly Coss, MD  [facility] 05/25/2019, 12:49 PM Pager [phone]  If 7PM-7AM, please contact night-coverage [email or web address] Password [email or web address]

## 2019-05-28 ENCOUNTER — Encounter (HOSPITAL_COMMUNITY)
Admission: RE | Admit: 2019-05-28 | Discharge: 2019-05-28 | Disposition: A | Discharge status: Home / Self Care | Payer: Medicare HMO | Source: Skilled Nursing Facility | Attending: Internal Medicine | Admitting: Internal Medicine

## 2019-05-28 DIAGNOSIS — N184 Chronic kidney disease, stage 4 (severe): Secondary | ICD-10-CM | POA: Insufficient documentation

## 2019-05-28 LAB — BASIC METABOLIC PANEL
Anion gap: 12 (ref 5–15)
BUN: 42 mg/dL — ABNORMAL HIGH (ref 8–23)
CO2: 26 mmol/L (ref 22–32)
Calcium: 8.4 mg/dL — ABNORMAL LOW (ref 8.9–10.3)
Chloride: 95 mmol/L — ABNORMAL LOW (ref 98–111)
Creatinine, Ser: 1.53 mg/dL — ABNORMAL HIGH (ref 0.44–1.00)
GFR calc Af Amer: 35 mL/min — ABNORMAL LOW (ref >60)
GFR calc non Af Amer: 30 mL/min — ABNORMAL LOW (ref >60)
Glucose, Bld: 125 mg/dL — ABNORMAL HIGH (ref 70–99)
Potassium: 3.3 mmol/L — ABNORMAL LOW (ref 3.5–5.1)
Sodium: 133 mmol/L — ABNORMAL LOW (ref 135–145)

## 2019-05-29 ENCOUNTER — Encounter (HOSPITAL_COMMUNITY): Payer: Medicare HMO

## 2019-05-29 ENCOUNTER — Non-Acute Institutional Stay (SKILLED_NURSING_FACILITY): Payer: Medicare HMO | Admitting: Adult Health

## 2019-05-29 ENCOUNTER — Encounter: Payer: Self-pay | Admitting: Adult Health

## 2019-05-29 DIAGNOSIS — R627 Adult failure to thrive: Secondary | ICD-10-CM | POA: Diagnosis not present

## 2019-05-29 DIAGNOSIS — I442 Atrioventricular block, complete: Secondary | ICD-10-CM

## 2019-05-29 DIAGNOSIS — G4733 Obstructive sleep apnea (adult) (pediatric): Secondary | ICD-10-CM | POA: Diagnosis not present

## 2019-05-29 DIAGNOSIS — K219 Gastro-esophageal reflux disease without esophagitis: Secondary | ICD-10-CM | POA: Diagnosis not present

## 2019-05-29 DIAGNOSIS — J9611 Chronic respiratory failure with hypoxia: Secondary | ICD-10-CM | POA: Diagnosis not present

## 2019-05-29 DIAGNOSIS — I5022 Chronic systolic (congestive) heart failure: Secondary | ICD-10-CM

## 2019-05-29 DIAGNOSIS — I4821 Permanent atrial fibrillation: Secondary | ICD-10-CM | POA: Diagnosis not present

## 2019-05-29 DIAGNOSIS — K5909 Other constipation: Secondary | ICD-10-CM | POA: Insufficient documentation

## 2019-05-29 DIAGNOSIS — J3089 Other allergic rhinitis: Secondary | ICD-10-CM | POA: Insufficient documentation

## 2019-05-29 DIAGNOSIS — N183 Chronic kidney disease, stage 3 unspecified: Secondary | ICD-10-CM

## 2019-05-29 DIAGNOSIS — H401130 Primary open-angle glaucoma, bilateral, stage unspecified: Secondary | ICD-10-CM | POA: Diagnosis not present

## 2019-05-29 DIAGNOSIS — I639 Cerebral infarction, unspecified: Secondary | ICD-10-CM

## 2019-05-29 DIAGNOSIS — N170 Acute kidney failure with tubular necrosis: Secondary | ICD-10-CM

## 2019-05-29 NOTE — Progress Notes (Signed)
Location:    [place] Room Number: 156/W Place of Service:  SNF (31)   CODE STATUS: Full Code  Allergies  Allergen Reactions  . Hydrocodone Other (See Comments)    Took away all energy and made her stop eating food for short time  . Percocet [Oxycodone-Acetaminophen] Other (See Comments)    Too away all energy and made her stop eating food for short time  . Eggs Or Egg-Derived Products Hives and Rash  . Mercurial Derivatives Itching and Rash  . Penicillins Hives and Rash    Has patient had a PCN reaction causing immediate rash, facial/tongue/throat swelling, SOB or lightheadedness with hypotension: Yes Has patient had a PCN reaction causing severe rash involving mucus membranes or skin necrosis: No Has patient had a PCN reaction that required hospitalization: No Has patient had a PCN reaction occurring within the last 10 years: Yes If all of the above answers are "NO", then may proceed with Cephalosporin use.  . Quinine Derivatives Hives and Rash    Chief Complaint  Patient presents with  . Hospitalization Follow-up    Hospitalization Follow Up    HPI:  She is a 85 year old woman who has been hospitalized from 05-14-19 through 05-25-19. She presented to the ED with her daughter after a reported 5 emesis with coffee ground appearance. Her hgb remained stable throughout her hospitalization. She has had a poor po intake prior to her hospitalization. She was treated for acute on chronic renal failure; failure thrive and chf. She is here for short term rehab with her goal to return back home. She denies any nausea or vomiting. No cough or shortness of breath. She will continue to monitor her chronic illnesses including: chf; respiratory failure; gerd.   Past Medical History:  Diagnosis Date  . Anemia    occassionally  . Arthritis    all over.  . Asthma    Allergixc reaction to cats.  . Atrial fibrillation ([place])   . Bowel obstruction ([place])   . Chronic  systolic CHF (congestive heart failure) (HCC)    EF 30-35% by cath, echo 2016 EF 50%  . Complete heart block (HCC)    a. s/p MDT CRTP pacemaker  . DCM (dilated cardiomyopathy) ([place]) 08/16/2014   normal coronary arteries on cath with EF 30-45%.  EF now 50% by echo 11/2014  . Diabetes mellitus 2006   Diet and exercise controlled.  Marland Kitchen Dyspnea   . GERD (gastroesophageal reflux disease)    occ  . Glaucoma 08/17/2018  . Hypertension   . Left tibial fracture 2007  . OSA (obstructive sleep apnea) 10/23/2014   Moderate with AHI 21/hr  . Osteoarthritis of right shoulder region 06/26/2013  . Stroke [facility])     Past Surgical History:  Procedure Laterality Date  . ABDOMINAL HYSTERECTOMY  1969  . BI-VENTRICULAR PACEMAKER INSERTION N/A 10/07/2014   MDT CRTP implanted by Dr Lovena Le  . BRAVO [place] STUDY N/A 06/11/2014   Procedure: BRAVO [place] STUDY;  Surgeon: Inda Castle, MD;  [facility];  Service: Endoscopy;  Laterality: N/A;  . BUNIONECTOMY  1984   Bilateral  . CARDIAC CATHETERIZATION     normal coronary arteries  . Carpal Tunnell  2004   Bilateral  . CATARACT EXTRACTION Right    early 2015  . corn removal  1999   Bilateral feet  . DILATION AND CURETTAGE OF UTERUS  1968  . ESOPHAGOGASTRODUODENOSCOPY (EGD) WITH PROPOFOL N/A 06/11/2014   Procedure: ESOPHAGOGASTRODUODENOSCOPY (  EGD) WITH PROPOFOL;  Surgeon: Inda Castle, MD;  [facility];  Service: Endoscopy;  Laterality: N/A;  . [place]   Surgery to fix Retainal detachment, bilateral  . JOINT REPLACEMENT    . LEFT AND RIGHT HEART CATHETERIZATION WITH CORONARY ANGIOGRAM N/A 08/29/2014   Procedure: LEFT AND RIGHT HEART CATHETERIZATION WITH CORONARY ANGIOGRAM;  Surgeon: Peter M Martinique, MD;  Location: [facility] CATH LAB;  Service: Cardiovascular;  Laterality: N/A;  . MALONEY DILATION  06/11/2014   Procedure: Venia Minks DILATION;  Surgeon: Inda Castle, MD;  [facility];  Service: Endoscopy;;  . PILONIDAL CYST  EXCISION  1959  . RIGHT HEART CATH N/A 07/24/2018   Procedure: RIGHT HEART CATH;  Surgeon: Larey Dresser, MD;  Location: [place] CV LAB;  Service: Cardiovascular;  Laterality: N/A;  . TONSILLECTOMY  1942  . TOTAL HIP ARTHROPLASTY  11/16/2011   Procedure: TOTAL HIP ARTHROPLASTY ANTERIOR APPROACH;  Surgeon: Hessie Dibble, MD;  Location: [place];  Service: Orthopedics;  Laterality: Right;  DEPUY  . TOTAL SHOULDER ARTHROPLASTY Right 06/26/2013   Procedure: TOTAL SHOULDER ARTHROPLASTY;  Surgeon: Johnny Bridge, MD;  Location: [place];  Service: Orthopedics;  Laterality: Right;    Social History   Socioeconomic History  . Marital status: Widowed    Spouse name: Not on file  . Number of children: 0  . Years of education: Not on file  . Highest education level: Doctorate  Occupational History  . Occupation: retired  Scientific laboratory technician  . Financial resource strain: Not hard at all  . Food insecurity    Worry: Never true    Inability: Never true  . Transportation needs    Medical: No    Non-medical: No  Tobacco Use  . Smoking status: Former Smoker    Packs/day: 1.00    Years: 45.00    Pack years: 45.00    Types: Cigarettes    Quit date: 07/24/1978    Years since quitting: 40.8  . Smokeless tobacco: Never Used  Substance and Sexual Activity  . Alcohol use: Yes    Alcohol/week: 7.0 standard drinks    Types: 7 Glasses of wine per week    Comment: every day  . Drug use: No  . Sexual activity: Not Currently  Lifestyle  . Physical activity    Days per week: Not on file    Minutes per session: Not on file  . Stress: Not on file  Relationships  . Social Herbalist on phone: Not on file    Gets together: Not on file    Attends religious service: Not on file    Active member of club or organization: Not on file    Attends meetings of clubs or organizations: Not on file    Relationship status: Not on file  . Intimate partner violence    Fear of current or ex partner:  Not on file    Emotionally abused: Not on file    Physically abused: Not on file    Forced sexual activity: Not on file  Other Topics Concern  . Not on file  Social History Narrative   Lives alone   Family History  Problem Relation Age of Onset  . Dementia Mother   . Coronary artery disease Mother   . Cancer Father        blood ? type  . Diabetes Maternal Grandfather   . Anuerysm Daughter  brain  . Colon cancer Neg Hx       VITAL SIGNS BP 104/81   Pulse 62   Temp 97.9 F (36.6 C) (Oral)   Resp 18   Ht 5\' 3"  (1.6 m)   Wt 133 lb (60.3 kg)   BMI 23.56 kg/m   Outpatient Encounter Medications as of 05/29/2019  Medication Sig  . Acetaminophen (TYLENOL) 325 MG CAPS Take 650 mg by mouth daily as needed (for shoulder pain).   Marland Kitchen apixaban (ELIQUIS) 2.5 MG TABS tablet Take 1 tablet (2.5 mg total) by mouth 2 (two) times daily.  . bumetanide (BUMEX) 2 MG tablet Take 2 tablets (4 mg total) by mouth 2 (two) times daily.  . diclofenac sodium (VOLTAREN) 1 % GEL Apply 2 g topically 3 (three) times daily as needed (pain, inflammation).  . fexofenadine (ALLEGRA) 180 MG tablet Take 180 mg by mouth daily.  Marland Kitchen glucose blood test strip Use to test blood sugar twice daily ([employer] Smartview)  . latanoprost (XALATAN) 0.005 % ophthalmic solution Place 1 drop into both eyes at bedtime.   . NON FORMULARY Diet: Dysphagia 3 diet  . OXYGEN Inhale 2 L into the lungs continuous.   . pantoprazole (PROTONIX) 40 MG tablet Take 1 tablet (40 mg total) by mouth daily.  . polyethylene glycol (MIRALAX / GLYCOLAX) 17 g packet Take 17 g by mouth daily.  Marland Kitchen senna (SENOKOT) 8.6 MG TABS tablet Take 1 tablet (8.6 mg total) by mouth at bedtime.  . timolol (TIMOPTIC) 0.5 % ophthalmic solution Place 1 drop into both eyes at bedtime.    No facility-administered encounter medications on file as of 05/29/2019.      SIGNIFICANT DIAGNOSTIC EXAMS  TODAY;   05-14-19: chest x-ray: No active disease. Stable  cardiomegaly.   05-14-19: ct of head:  1. No CT evidence for acute intracranial abnormality. 2. Atrophy and small vessel ischemic changes of the white matter  05-14-19: ct of abdomen and pelvis: Lower pole renal atrophy on the left consistent with prior infarct. Mild changes of anasarca. Diverticulosis without diverticulitis  LABS REVIEWED TODAY;   05-14-19: wbc 3.2; hgb 15.5; hct 46.8; mcv 91.6 ;plt 162; glucose 112; bun 60; creat 1.71; k+ 5.7; na++ 130; ca 9.3; total bili 3.9; mag 2.8; albumin 3.6 05-15-19: BNP 1217.6 05-16-19: wbc 3.5; hgb 13.9; hct 42.0; mvc 91.5 plt 141; glucose 146; bun 49; creat 1.59; k+ 4.5; ca 8.8 05-19-19: wbc 3.3; hgb 13.6; hct 41.7; mcv 89.5 plt 143; glucose 110; bun 38; creat 1.40 ;k+ 4.6; an++ 131; ca 8.4 05-25-19: wbc 3.4; hgb 12.4; hct 38.2; mcv 89.5 plt 121; glucose 160; bun 48; creat 1.181; k+ 4.7; na++ 129; ca 8.5 05-28-19: glucose 125; bun 42; creat 1.53; k+ 3.3; na++ 133; ca 8.4   Review of Systems  Constitutional: Negative for malaise/fatigue.  Respiratory: Negative for cough and shortness of breath.   Cardiovascular: Negative for chest pain, palpitations and leg swelling.  Gastrointestinal: Negative for abdominal pain, constipation and heartburn.  Musculoskeletal: Negative for back pain, joint pain and myalgias.  Skin: Negative.   Neurological: Negative for dizziness.  Psychiatric/Behavioral: The patient is not nervous/anxious.     Physical Exam Constitutional:      General: She is not in acute distress.    Appearance: She is well-developed. She is not diaphoretic.  Eyes:     Comments: History of right cataract removal   Neck:     Musculoskeletal: Neck supple.     Thyroid: No thyromegaly.  Cardiovascular:  Rate and Rhythm: Normal rate and regular rhythm.     Pulses: Normal pulses.     Heart sounds: Normal heart sounds.     Comments: History of pace maker Pulmonary:     Effort: Pulmonary effort is normal. No respiratory distress.      Breath sounds: Normal breath sounds.     Comments: 02 dependent  Abdominal:     General: Bowel sounds are normal. There is no distension.     Palpations: Abdomen is soft.     Tenderness: There is no abdominal tenderness.  Musculoskeletal: Normal range of motion.     Right lower leg: No edema.     Left lower leg: No edema.     Comments: 2013: right hip arthroplasty 2014: right shoulder arthroplasty   Lymphadenopathy:     Cervical: No cervical adenopathy.  Skin:    General: Skin is warm and dry.  Neurological:     Mental Status: She is alert and oriented to person, place, and time.  Psychiatric:        Mood and Affect: Mood normal.      ASSESSMENT/ PLAN:  TODAY;   1. Complete heart block: is status post pace maker: is stable will monitor   2. Permanent atrial fibrillation: is status post pace maker: heart rate is stable will continue eliquis 2.5 mg twice daily   3. Chronic systolic CHF (congestive heart failure) is stable EF 35-40% (07-21-18) will continue bumex 4 mg twice daily   4. OSA : is stable; uses CPAP at home  5. Chronic respiratory failure with hypoxia: is stable 02 dependent will monitor   6. Gastroesophageal reflux disease without esophagitis: is stable will continue protonix 40 mg daily   7. Occipital infarction: is neurologically stable will continue eliquis 2.5 mg twice daily   8. Acute renal failure with acute tubular necrosis superimposed on chronic stage 3 chronic renal disease: is stable bun 42; creat 1.53 Will monitor   9. Failure to thrive in adult: is without change weight is 133 pounds; will continue supplements as directed will continue therapy as directed.   10. Primary open angle glaucoma of both eyes unspecified glaucoma stage: is stable will continue timpotic to both eyes nightly and xalatan to both eyes nightly   11. Chronic constipation: is stable will continue miralax daily and senna nightly   12. Chronic non-seasonal allergic rhinitis:  is stable will continue allegra 180 mg daily   13. Hypokalemia: is worse; k+ 3.3 will give 40 meq k+ now and will repeat in the AM.          MD is aware of resident's narcotic use and is in agreement with current plan of care. We will attempt to wean resident as appropriate.  Ok Edwards NP [facility] Adult Medicine  Contact [phone] Monday through Friday 8am- 5pm  After hours call [phone]

## 2019-05-29 NOTE — Addendum Note (Signed)
Addended by: Kerry Dory on: 05/29/2019 10:50 AM   Modules accepted: Orders

## 2019-05-30 ENCOUNTER — Encounter (HOSPITAL_COMMUNITY)
Admission: RE | Admit: 2019-05-30 | Discharge: 2019-05-30 | Disposition: A | Discharge status: Home / Self Care | Payer: Medicare HMO | Source: Skilled Nursing Facility | Attending: Adult Health | Admitting: Adult Health

## 2019-05-30 DIAGNOSIS — I480 Paroxysmal atrial fibrillation: Secondary | ICD-10-CM | POA: Insufficient documentation

## 2019-05-30 DIAGNOSIS — G4733 Obstructive sleep apnea (adult) (pediatric): Secondary | ICD-10-CM | POA: Diagnosis not present

## 2019-05-30 DIAGNOSIS — J45909 Unspecified asthma, uncomplicated: Secondary | ICD-10-CM | POA: Diagnosis not present

## 2019-05-30 DIAGNOSIS — I130 Hypertensive heart and chronic kidney disease with heart failure and stage 1 through stage 4 chronic kidney disease, or unspecified chronic kidney disease: Secondary | ICD-10-CM | POA: Insufficient documentation

## 2019-05-30 DIAGNOSIS — I509 Heart failure, unspecified: Secondary | ICD-10-CM | POA: Diagnosis not present

## 2019-05-30 DIAGNOSIS — J9601 Acute respiratory failure with hypoxia: Secondary | ICD-10-CM | POA: Diagnosis not present

## 2019-05-30 DIAGNOSIS — R0600 Dyspnea, unspecified: Secondary | ICD-10-CM | POA: Diagnosis not present

## 2019-05-30 DIAGNOSIS — I442 Atrioventricular block, complete: Secondary | ICD-10-CM | POA: Insufficient documentation

## 2019-05-30 LAB — POTASSIUM: Potassium: 3.5 mmol/L (ref 3.5–5.1)

## 2019-06-04 ENCOUNTER — Encounter: Payer: Self-pay | Admitting: Adult Health

## 2019-06-04 ENCOUNTER — Non-Acute Institutional Stay (SKILLED_NURSING_FACILITY): Payer: Medicare HMO | Admitting: Adult Health

## 2019-06-04 DIAGNOSIS — I5022 Chronic systolic (congestive) heart failure: Secondary | ICD-10-CM | POA: Diagnosis not present

## 2019-06-04 NOTE — Progress Notes (Signed)
Location:    [place] Room Number: 106/D Place of Service:  SNF (31)   CODE STATUS: Full Code  Allergies  Allergen Reactions  . Hydrocodone Other (See Comments)    Took away all energy and made her stop eating food for short time  . Percocet [Oxycodone-Acetaminophen] Other (See Comments)    Too away all energy and made her stop eating food for short time  . Eggs Or Egg-Derived Products Hives and Rash  . Mercurial Derivatives Itching and Rash  . Penicillins Hives and Rash    Has patient had a PCN reaction causing immediate rash, facial/tongue/throat swelling, SOB or lightheadedness with hypotension: Yes Has patient had a PCN reaction causing severe rash involving mucus membranes or skin necrosis: No Has patient had a PCN reaction that required hospitalization: No Has patient had a PCN reaction occurring within the last 10 years: Yes If all of the above answers are "NO", then may proceed with Cephalosporin use.  . Quinine Derivatives Hives and Rash    Chief Complaint  Patient presents with  . Acute Visit    Weight Gain,     HPI:  She has gained weight from 134 pounds on 05-25-19 to her weight of 138 pounds 05-31-19. She denies any shortness of breath; no cough; she does have some slightly worse edema. There are no reports of fevers present.   Past Medical History:  Diagnosis Date  . Anemia    occassionally  . Arthritis    all over.  . Asthma    Allergixc reaction to cats.  . Atrial fibrillation ([place])   . Bowel obstruction ([place])   . Chronic systolic CHF (congestive heart failure) (HCC)    EF 30-35% by cath, echo 2016 EF 50%  . Complete heart block (HCC)    a. s/p MDT CRTP pacemaker  . DCM (dilated cardiomyopathy) ([place]) 08/16/2014   normal coronary arteries on cath with EF 30-45%.  EF now 50% by echo 11/2014  . Diabetes mellitus 2006   Diet and exercise controlled.  Marland Kitchen Dyspnea   . GERD (gastroesophageal reflux disease)    occ  . Glaucoma  08/17/2018  . Hypertension   . Left tibial fracture 2007  . OSA (obstructive sleep apnea) 10/23/2014   Moderate with AHI 21/hr  . Osteoarthritis of right shoulder region 06/26/2013  . Stroke [facility])     Past Surgical History:  Procedure Laterality Date  . ABDOMINAL HYSTERECTOMY  1969  . BI-VENTRICULAR PACEMAKER INSERTION N/A 10/07/2014   MDT CRTP implanted by Dr Lovena Le  . BRAVO [place] STUDY N/A 06/11/2014   Procedure: BRAVO [place] STUDY;  Surgeon: Inda Castle, MD;  [facility];  Service: Endoscopy;  Laterality: N/A;  . BUNIONECTOMY  1984   Bilateral  . CARDIAC CATHETERIZATION     normal coronary arteries  . Carpal Tunnell  2004   Bilateral  . CATARACT EXTRACTION Right    early 2015  . corn removal  1999   Bilateral feet  . DILATION AND CURETTAGE OF UTERUS  1968  . ESOPHAGOGASTRODUODENOSCOPY (EGD) WITH PROPOFOL N/A 06/11/2014   Procedure: ESOPHAGOGASTRODUODENOSCOPY (EGD) WITH PROPOFOL;  Surgeon: Inda Castle, MD;  [facility];  Service: Endoscopy;  Laterality: N/A;  . [place]   Surgery to fix Retainal detachment, bilateral  . JOINT REPLACEMENT    . LEFT AND RIGHT HEART CATHETERIZATION WITH CORONARY ANGIOGRAM N/A 08/29/2014   Procedure: LEFT AND RIGHT HEART CATHETERIZATION WITH CORONARY ANGIOGRAM;  Surgeon:  Peter M Martinique, MD;  Location: [facility] CATH LAB;  Service: Cardiovascular;  Laterality: N/A;  . MALONEY DILATION  06/11/2014   Procedure: Venia Minks DILATION;  Surgeon: Inda Castle, MD;  [facility];  Service: Endoscopy;;  . PILONIDAL CYST EXCISION  1959  . RIGHT HEART CATH N/A 07/24/2018   Procedure: RIGHT HEART CATH;  Surgeon: Larey Dresser, MD;  Location: [place] CV LAB;  Service: Cardiovascular;  Laterality: N/A;  . TONSILLECTOMY  1942  . TOTAL HIP ARTHROPLASTY  11/16/2011   Procedure: TOTAL HIP ARTHROPLASTY ANTERIOR APPROACH;  Surgeon: Hessie Dibble, MD;  Location: [place];  Service: Orthopedics;  Laterality: Right;  DEPUY  .  TOTAL SHOULDER ARTHROPLASTY Right 06/26/2013   Procedure: TOTAL SHOULDER ARTHROPLASTY;  Surgeon: Johnny Bridge, MD;  Location: [place];  Service: Orthopedics;  Laterality: Right;    Social History   Socioeconomic History  . Marital status: Widowed    Spouse name: Not on file  . Number of children: 0  . Years of education: Not on file  . Highest education level: Doctorate  Occupational History  . Occupation: retired  Scientific laboratory technician  . Financial resource strain: Not hard at all  . Food insecurity    Worry: Never true    Inability: Never true  . Transportation needs    Medical: No    Non-medical: No  Tobacco Use  . Smoking status: Former Smoker    Packs/day: 1.00    Years: 45.00    Pack years: 45.00    Types: Cigarettes    Quit date: 07/24/1978    Years since quitting: 40.8  . Smokeless tobacco: Never Used  Substance and Sexual Activity  . Alcohol use: Yes    Alcohol/week: 7.0 standard drinks    Types: 7 Glasses of wine per week    Comment: every day  . Drug use: No  . Sexual activity: Not Currently  Lifestyle  . Physical activity    Days per week: Not on file    Minutes per session: Not on file  . Stress: Not on file  Relationships  . Social Herbalist on phone: Not on file    Gets together: Not on file    Attends religious service: Not on file    Active member of club or organization: Not on file    Attends meetings of clubs or organizations: Not on file    Relationship status: Not on file  . Intimate partner violence    Fear of current or ex partner: Not on file    Emotionally abused: Not on file    Physically abused: Not on file    Forced sexual activity: Not on file  Other Topics Concern  . Not on file  Social History Narrative   Lives alone   Family History  Problem Relation Age of Onset  . Dementia Mother   . Coronary artery disease Mother   . Cancer Father        blood ? type  . Diabetes Maternal Grandfather   . Anuerysm Daughter         brain  . Colon cancer Neg Hx       VITAL SIGNS BP 110/60   Pulse 60   Temp 98.2 F (36.8 C) (Oral)   Resp 17   Ht 5\' 3"  (1.6 m)   Wt 138 lb 3.2 oz (62.7 kg)   SpO2 98%   BMI 24.48 kg/m   Outpatient Encounter Medications as  of 06/04/2019  Medication Sig  . Acetaminophen (TYLENOL) 325 MG CAPS Take 650 mg by mouth daily as needed (for shoulder pain).   Marland Kitchen apixaban (ELIQUIS) 2.5 MG TABS tablet Take 1 tablet (2.5 mg total) by mouth 2 (two) times daily.  . bumetanide (BUMEX) 2 MG tablet Take 2 tablets (4 mg total) by mouth 2 (two) times daily.  . diclofenac sodium (VOLTAREN) 1 % GEL Apply 2 g topically 3 (three) times daily as needed (pain, inflammation).  . fexofenadine (ALLEGRA) 180 MG tablet Take 180 mg by mouth daily.  Marland Kitchen glucose blood test strip Use to test blood sugar twice daily ([employer] Smartview)  . hydrocortisone cream 1 % Cream; amt: sparingly; topical  Special Instructions: apply to itchy skin Once A Day - PRN PRN 1  . latanoprost (XALATAN) 0.005 % ophthalmic solution Place 1 drop into both eyes at bedtime.   . NON FORMULARY Diet: Dysphagia 3 diet  . OXYGEN Inhale 2 L into the lungs continuous.   . pantoprazole (PROTONIX) 40 MG tablet Take 1 tablet (40 mg total) by mouth daily.  . polyethylene glycol (MIRALAX / GLYCOLAX) 17 g packet Take 17 g by mouth daily.  Marland Kitchen senna (SENOKOT) 8.6 MG TABS tablet Take 1 tablet (8.6 mg total) by mouth at bedtime.  . timolol (TIMOPTIC) 0.5 % ophthalmic solution Place 1 drop into both eyes at bedtime.    No facility-administered encounter medications on file as of 06/04/2019.      SIGNIFICANT DIAGNOSTIC EXAMS  PREVIOUS;   05-14-19: chest x-ray: No active disease. Stable cardiomegaly.   05-14-19: ct of head:  1. No CT evidence for acute intracranial abnormality. 2. Atrophy and small vessel ischemic changes of the white matter  05-14-19: ct of abdomen and pelvis: Lower pole renal atrophy on the left consistent with prior infarct. Mild  changes of anasarca. Diverticulosis without diverticulitis  NO NEW EXAMS.   LABS REVIEWED PREVIOUS;   05-14-19: wbc 3.2; hgb 15.5; hct 46.8; mcv 91.6 ;plt 162; glucose 112; bun 60; creat 1.71; k+ 5.7; na++ 130; ca 9.3; total bili 3.9; mag 2.8; albumin 3.6 05-15-19: BNP 1217.6 05-16-19: wbc 3.5; hgb 13.9; hct 42.0; mvc 91.5 plt 141; glucose 146; bun 49; creat 1.59; k+ 4.5; ca 8.8 05-19-19: wbc 3.3; hgb 13.6; hct 41.7; mcv 89.5 plt 143; glucose 110; bun 38; creat 1.40 ;k+ 4.6; an++ 131; ca 8.4 05-25-19: wbc 3.4; hgb 12.4; hct 38.2; mcv 89.5 plt 121; glucose 160; bun 48; creat 1.181; k+ 4.7; na++ 129; ca 8.5 05-28-19: glucose 125; bun 42; creat 1.53; k+ 3.3; na++ 133; ca 8.4   NO NEW LABS.   Review of Systems  Constitutional: Negative for malaise/fatigue.  Respiratory: Negative for cough and shortness of breath.   Cardiovascular: Positive for leg swelling. Negative for chest pain and palpitations.  Gastrointestinal: Negative for abdominal pain, constipation and heartburn.  Musculoskeletal: Negative for back pain, joint pain and myalgias.  Skin: Negative.   Neurological: Negative for dizziness.  Psychiatric/Behavioral: The patient is not nervous/anxious.     Physical Exam Constitutional:      General: She is not in acute distress.    Appearance: She is well-developed. She is not diaphoretic.  Eyes:     Comments: History of right cataract removal   Neck:     Musculoskeletal: Neck supple.     Thyroid: No thyromegaly.  Cardiovascular:     Rate and Rhythm: Normal rate and regular rhythm.     Pulses: Normal pulses.  Heart sounds: Normal heart sounds.     Comments: Pace maker Pulmonary:     Effort: Pulmonary effort is normal. No respiratory distress.     Breath sounds: Normal breath sounds.     Comments: 02 dependent  Abdominal:     General: Bowel sounds are normal. There is no distension.     Palpations: Abdomen is soft.     Tenderness: There is no abdominal tenderness.   Musculoskeletal:     Right lower leg: Edema present.     Left lower leg: Edema present.     Comments: 1+ bilateral lower extremity edema 2013: right hip arthroplasty 2014: right shoulder arthroplasty    Lymphadenopathy:     Cervical: No cervical adenopathy.  Skin:    General: Skin is warm and dry.  Neurological:     Mental Status: She is alert and oriented to person, place, and time.  Psychiatric:        Mood and Affect: Mood normal.     ASSESSMENT/ PLAN:  TODAY;   1.Chronic systolic CHF (congestive heart failure) is worse EF 35-40 % (07-28-18) will continue bumex 4 mg twice daily; give an extra 2 mg bumex today with k+ 40 meq and will repeat bmp on 06-05-19.     MD is aware of resident's narcotic use and is in agreement with current plan of care. We will attempt to wean resident as appropriate.  Ok Edwards NP [facility] Adult Medicine  Contact [phone] Monday through Friday 8am- 5pm  After hours call [phone]

## 2019-06-05 ENCOUNTER — Encounter: Payer: Self-pay | Admitting: Adult Health

## 2019-06-05 ENCOUNTER — Encounter (HOSPITAL_COMMUNITY)
Admission: RE | Admit: 2019-06-05 | Discharge: 2019-06-05 | Disposition: A | Discharge status: Home / Self Care | Payer: Medicare HMO | Source: Ambulatory Visit | Attending: Adult Health | Admitting: Adult Health

## 2019-06-05 ENCOUNTER — Non-Acute Institutional Stay (SKILLED_NURSING_FACILITY): Payer: Medicare HMO | Admitting: Adult Health

## 2019-06-05 DIAGNOSIS — I442 Atrioventricular block, complete: Secondary | ICD-10-CM | POA: Insufficient documentation

## 2019-06-05 DIAGNOSIS — J9611 Chronic respiratory failure with hypoxia: Secondary | ICD-10-CM | POA: Diagnosis not present

## 2019-06-05 DIAGNOSIS — I639 Cerebral infarction, unspecified: Secondary | ICD-10-CM

## 2019-06-05 DIAGNOSIS — K219 Gastro-esophageal reflux disease without esophagitis: Secondary | ICD-10-CM

## 2019-06-05 DIAGNOSIS — I480 Paroxysmal atrial fibrillation: Secondary | ICD-10-CM | POA: Insufficient documentation

## 2019-06-05 DIAGNOSIS — I130 Hypertensive heart and chronic kidney disease with heart failure and stage 1 through stage 4 chronic kidney disease, or unspecified chronic kidney disease: Secondary | ICD-10-CM | POA: Insufficient documentation

## 2019-06-05 LAB — BASIC METABOLIC PANEL
Anion gap: 13 (ref 5–15)
BUN: 38 mg/dL — ABNORMAL HIGH (ref 8–23)
CO2: 23 mmol/L (ref 22–32)
Calcium: 9 mg/dL (ref 8.9–10.3)
Chloride: 94 mmol/L — ABNORMAL LOW (ref 98–111)
Creatinine, Ser: 1.7 mg/dL — ABNORMAL HIGH (ref 0.44–1.00)
GFR calc Af Amer: 31 mL/min — ABNORMAL LOW (ref >60)
GFR calc non Af Amer: 27 mL/min — ABNORMAL LOW (ref >60)
Glucose, Bld: 82 mg/dL (ref 70–99)
Potassium: 4.7 mmol/L (ref 3.5–5.1)
Sodium: 130 mmol/L — ABNORMAL LOW (ref 135–145)

## 2019-06-05 NOTE — Progress Notes (Signed)
Location:    [facility] Room Number: 106/D Place of Service:  SNF (31)   CODE STATUS: Full Code  Allergies  Allergen Reactions  . Hydrocodone Other (See Comments)    Took away all energy and made her stop eating food for short time  . Percocet [Oxycodone-Acetaminophen] Other (See Comments)    Too away all energy and made her stop eating food for short time  . Eggs Or Egg-Derived Products Hives and Rash  . Mercurial Derivatives Itching and Rash  . Penicillins Hives and Rash    Has patient had a PCN reaction causing immediate rash, facial/tongue/throat swelling, SOB or lightheadedness with hypotension: Yes Has patient had a PCN reaction causing severe rash involving mucus membranes or skin necrosis: No Has patient had a PCN reaction that required hospitalization: No Has patient had a PCN reaction occurring within the last 10 years: Yes If all of the above answers are "NO", then may proceed with Cephalosporin use.  . Quinine Derivatives Hives and Rash    Chief Complaint  Patient presents with  . Medical Management of Chronic Issues         Chronic respiratory failure with hypoxia   Gastroesophageal reflux disease without esophagitis:   Occipital infarction:   Weekly follow up for the first 30 days post hospitalization.      HPI:  She is a 85 year old short term rehab patient being seen for the management of her chronic illnesses: respiratory failure; gerd; cva. She denies any heart burn; no constipation; no cough; no shortness of breath. She denies any worsening lower extremity edema.   Past Medical History:  Diagnosis Date  . Anemia    occassionally  . Arthritis    all over.  . Asthma    Allergixc reaction to cats.  . Atrial fibrillation ([place])   . Bowel obstruction ([place])   . Chronic systolic CHF (congestive heart failure) (HCC)    EF 30-35% by cath, echo 2016 EF 50%  . Complete heart block (H[facility])    a. s/p MDT CRTP pacemaker  . DCM (dilated  cardiomyopathy) ([place]) 08/16/2014   normal coronary arteries on cath with EF 30-45%.  EF now 50% by echo 11/2014  . Diabetes mellitus 2006   Diet and exercise controlled.  Marland Kitchen Dyspnea   . GERD (gastroesophageal reflux disease)    occ  . Glaucoma 08/17/2018  . Hypertension   . Left tibial fracture 2007  . OSA (obstructive sleep apnea) 10/23/2014   Moderate with AHI 21/hr  . Osteoarthritis of right shoulder region 06/26/2013  . Stroke [facility])     Past Surgical History:  Procedure Laterality Date  . ABDOMINAL HYSTERECTOMY  1969  . BI-VENTRICULAR PACEMAKER INSERTION N/A 10/07/2014   MDT CRTP implanted by Dr Lovena Le  . BRAVO [place] STUDY N/A 06/11/2014   Procedure: BRAVO [place] STUDY;  Surgeon: Inda Castle, MD;  Location: [facility];  Service: Endoscopy;  Laterality: N/A;  . BUNIONECTOMY  1984   Bilateral  . CARDIAC CATHETERIZATION     normal coronary arteries  . Carpal Tunnell  2004   Bilateral  . CATARACT EXTRACTION Right    early 2015  . corn removal  1999   Bilateral feet  . DILATION AND CURETTAGE OF UTERUS  1968  . ESOPHAGOGASTRODUODENOSCOPY (EGD) WITH PROPOFOL N/A 06/11/2014   Procedure: ESOPHAGOGASTRODUODENOSCOPY (EGD) WITH PROPOFOL;  Surgeon: Inda Castle, MD;  Location: [facility];  Service: Endoscopy;  Laterality: N/A;  . EYE SURGERY  1983   Surgery to fix Retainal detachment, bilateral  . JOINT REPLACEMENT    . LEFT AND RIGHT HEART CATHETERIZATION WITH CORONARY ANGIOGRAM N/A 08/29/2014   Procedure: LEFT AND RIGHT HEART CATHETERIZATION WITH CORONARY ANGIOGRAM;  Surgeon: Peter M Martinique, MD;  Location: [facility] C[facility] LAB;  Service: Cardiovascular;  Laterality: N/A;  . MALONEY DILATION  06/11/2014   Procedure: Venia Minks DILATION;  Surgeon: Inda Castle, MD;  Location: [facility];  Service: Endoscopy;;  . PILONIDAL CYST EXCISION  1959  . RIGHT HEART CATH N/A 07/24/2018   Procedure: RIGHT HEART CATH;  Surgeon: Larey Dresser, MD;  Location: [place] CV LAB;  Service:  Cardiovascular;  Laterality: N/A;  . TONSILLECTOMY  1942  . TOTAL HIP ARTHROPLASTY  11/16/2011   Procedure: TOTAL HIP ARTHROPLASTY ANTERIOR APPROACH;  Surgeon: Hessie Dibble, MD;  Location: [place];  Service: Orthopedics;  Laterality: Right;  DEPUY  . TOTAL SHOULDER ARTHROPLASTY Right 06/26/2013   Procedure: TOTAL SHOULDER ARTHROPLASTY;  Surgeon: Johnny Bridge, MD;  Location: [place];  Service: Orthopedics;  Laterality: Right;    Social History   Socioeconomic History  . Marital status: Widowed    Spouse name: Not on file  . Number of children: 0  . Years of education: Not on file  . Highest education level: Doctorate  Occupational History  . Occupation: retired  Scientific laboratory technician  . Financial resource strain: Not hard at all  . Food insecurity    Worry: Never true    Inability: Never true  . Transportation needs    Medical: No    Non-medical: No  Tobacco Use  . Smoking status: Former Smoker    Packs/day: 1.00    Years: 45.00    Pack years: 45.00    Types: Cigarettes    Quit date: 07/24/1978    Years since quitting: 40.8  . Smokeless tobacco: Never Used  Substance and Sexual Activity  . Alcohol use: Yes    Alcohol/week: 7.0 standard drinks    Types: 7 Glasses of wine per week    Comment: every day  . Drug use: No  . Sexual activity: Not Currently  Lifestyle  . Physical activity    Days per week: Not on file    Minutes per session: Not on file  . Stress: Not on file  Relationships  . Social Herbalist on phone: Not on file    Gets together: Not on file    Attends religious service: Not on file    Active member of club or organization: Not on file    Attends meetings of clubs or organizations: Not on file    Relationship status: Not on file  . Intimate partner violence    Fear of current or ex partner: Not on file    Emotionally abused: Not on file    Physically abused: Not on file    Forced sexual activity: Not on file  Other Topics Concern  . Not on  file  Social History Narrative   Lives alone   Family History  Problem Relation Age of Onset  . Dementia Mother   . Coronary artery disease Mother   . Cancer Father        blood ? type  . Diabetes Maternal Grandfather   . Anuerysm Daughter        brain  . Colon cancer Neg Hx       VITAL SIGNS BP 110/60   Pulse 60   Temp  98.2 F (36.8 C) (Oral)   Resp 17   Ht 5\' 3"  (1.6 m)   Wt 140 lb (63.5 kg)   SpO2 98%   BMI 24.80 kg/m   Outpatient Encounter Medications as of 06/05/2019  Medication Sig  . Acetaminophen (TYLENOL) 325 MG CAPS Take 650 mg by mouth daily as needed (for shoulder pain).   Marland Kitchen apixaban (ELIQUIS) 2.5 MG TABS tablet Take 1 tablet (2.5 mg total) by mouth 2 (two) times daily.  . bumetanide (BUMEX) 2 MG tablet Take 2 tablets (4 mg total) by mouth 2 (two) times daily.  . diclofenac sodium (VOLTAREN) 1 % GEL Apply 2 g topically 3 (three) times daily as needed (pain, inflammation).  . fexofenadine (ALLEGRA) 180 MG tablet Take 180 mg by mouth daily.  Marland Kitchen glucose blood test strip Use to test blood sugar twice daily ([employer] [facility])  . hydrocortisone cream 1 % Cream; amt: sparingly; topical  Special Instructions: apply to itchy skin Once A Day - PRN PRN 1  . latanoprost (XALATAN) 0.005 % ophthalmic solution Place 1 drop into both eyes at bedtime.   . NON FORMULARY Diet: Dysphagia 3 diet  . OXYGEN Inhale 2 L into the lungs continuous.   . pantoprazole (PROTONIX) 40 MG tablet Take 1 tablet (40 mg total) by mouth daily.  . polyethylene glycol (MIRALAX / GLYCOLAX) 17 g packet Take 17 g by mouth daily.  Marland Kitchen senna (SENOKOT) 8.6 MG TABS tablet Take 1 tablet (8.6 mg total) by mouth at bedtime.  . timolol (TIMOPTIC) 0.5 % ophthalmic solution Place 1 drop into both eyes at bedtime.    No facility-administered encounter medications on file as of 06/05/2019.      SIGNIFICANT DIAGNOSTIC EXAMS   PREVIOUS;   05-14-19: chest x-ray: No active disease. Stable cardiomegaly.    05-14-19: ct of head:  1. No CT evidence for acute intracranial abnormality. 2. Atrophy and small vessel ischemic changes of the white matter  05-14-19: ct of abdomen and pelvis: Lower pole renal atrophy on the left consistent with prior infarct. Mild changes of anasarca. Diverticulosis without diverticulitis  NO NEW EXAMS.   LABS REVIEWED PREVIOUS;   05-14-19: wbc 3.2; hgb 15.5; hct 46.8; mcv 91.6 ;plt 162; glucose 112; bun 60; creat 1.71; k+ 5.7; na++ 130; ca 9.3; total bili 3.9; mag 2.8; albumin 3.6 05-15-19: BNP 1217.6 05-16-19: wbc 3.5; hgb 13.9; hct 42.0; mvc 91.5 plt 141; glucose 146; bun 49; creat 1.59; k+ 4.5; ca 8.8 05-19-19: wbc 3.3; hgb 13.6; hct 41.7; mcv 89.5 plt 143; glucose 110; bun 38; creat 1.40 ;k+ 4.6; an++ 131; ca 8.4 05-25-19: wbc 3.4; hgb 12.4; hct 38.2; mcv 89.5 plt 121; glucose 160; bun 48; creat 1.181; k+ 4.7; na++ 129; ca 8.5 05-28-19: glucose 125; bun 42; creat 1.53; k+ 3.3; na++ 133; ca 8.4   TODAY'  05-30-19: k+ 3.5 06-05-19: glucose 83; bun 38; creat 1.70; k+ 4.7; na++ 140; ca 9.0    Review of Systems  Constitutional: Negative for malaise/fatigue.  Respiratory: Negative for cough and shortness of breath.   Cardiovascular: Negative for chest pain, palpitations and leg swelling.  Gastrointestinal: Negative for abdominal pain, constipation and heartburn.  Musculoskeletal: Negative for back pain, joint pain and myalgias.  Skin: Negative.   Neurological: Negative for dizziness.  Psychiatric/Behavioral: The patient is not nervous/anxious.     Physical Exam Constitutional:      General: She is not in acute distress.    Appearance: She is well-developed. She is not diaphoretic.  Eyes:     Comments: History of bilateral cataracts History of bilateral retinal detachment repair   Neck:     Musculoskeletal: Neck supple.     Thyroid: No thyromegaly.  Cardiovascular:     Rate and Rhythm: Normal rate and regular rhythm.     Pulses: Normal pulses.     Heart  sounds: Normal heart sounds.  Pulmonary:     Effort: Pulmonary effort is normal. No respiratory distress.     Breath sounds: Normal breath sounds.     Comments: 02 dependent  Abdominal:     General: Bowel sounds are normal. There is no distension.     Palpations: Abdomen is soft.     Tenderness: There is no abdominal tenderness.  Musculoskeletal:     Right lower leg: Edema present.     Left lower leg: Edema present.     Comments: 1+ bilateral lower extremity edema 2013: right hip arthroplasty 2014: right shoulder arthroplasty     Lymphadenopathy:     Cervical: No cervical adenopathy.  Skin:    General: Skin is warm and dry.  Neurological:     Mental Status: She is alert and oriented to person, place, and time.  Psychiatric:        Mood and Affect: Mood normal.      ASSESSMENT/ PLAN:  TODAY;   1. Chronic respiratory failure with hypoxia: is stable 02 dependent will monitor  2. Gastroesophageal reflux disease without esophagitis: is stable will continue protonix 40 mg daily   3. Occipital infarction: is neurologically stable will continue eliquis 2.5 mg twice daily   PREVIOUS   4. Acute renal failure with acute tubular necrosis superimposed on chronic stage 3 chronic renal disease: is stable bun 38; creat 1.70  5. Failure to thrive in adult: is without change weight is 140 pounds; will continue supplements as directed will continue therapy as directed.   6. Primary open angle glaucoma of both eyes unspecified glaucoma stage: is stable will continue timpotic to both eyes nightly and xalatan to both eyes nightly   7. Chronic constipation: is stable will continue miralax daily and senna nightly   8. Chronic non-seasonal allergic rhinitis: is stable will continue allegra 180 mg daily   9. Hypokalemia: is 4.7 will monitor    10. Complete heart block: is status post pace maker: is stable will monitor   11. Permanent atrial fibrillation: is status post pace maker: heart  rate is stable will continue eliquis 2.5 mg twice daily   12. Chronic systolic CHF (congestive heart failure) is stable EF 35-40% (07-21-18) will continue bumex 4 mg twice daily   13. OSA : is stable; uses CPAP at home    MD is aware of resident's narcotic use and is in agreement with current plan of care. We will attempt to wean resident as appropriate.  Ok Edwards NP [facility]  Contact [phone] Monday through Friday 8am- 5pm  After hours call [phone]

## 2019-06-06 ENCOUNTER — Non-Acute Institutional Stay (SKILLED_NURSING_FACILITY): Payer: Medicare HMO | Admitting: Internal Medicine

## 2019-06-06 ENCOUNTER — Other Ambulatory Visit: Payer: Self-pay

## 2019-06-06 ENCOUNTER — Encounter: Payer: Self-pay | Admitting: Internal Medicine

## 2019-06-06 DIAGNOSIS — I5022 Chronic systolic (congestive) heart failure: Secondary | ICD-10-CM

## 2019-06-06 DIAGNOSIS — R627 Adult failure to thrive: Secondary | ICD-10-CM | POA: Diagnosis not present

## 2019-06-06 DIAGNOSIS — K920 Hematemesis: Secondary | ICD-10-CM

## 2019-06-06 DIAGNOSIS — K219 Gastro-esophageal reflux disease without esophagitis: Secondary | ICD-10-CM | POA: Diagnosis not present

## 2019-06-06 DIAGNOSIS — N170 Acute kidney failure with tubular necrosis: Secondary | ICD-10-CM

## 2019-06-06 DIAGNOSIS — N183 Chronic kidney disease, stage 3 unspecified: Secondary | ICD-10-CM

## 2019-06-06 DIAGNOSIS — I4821 Permanent atrial fibrillation: Secondary | ICD-10-CM

## 2019-06-06 DIAGNOSIS — I10 Essential (primary) hypertension: Secondary | ICD-10-CM

## 2019-06-06 NOTE — Progress Notes (Signed)
: Provider:  Hennie Duos., MD Location:  [facility] Room Number: 106-D Place of Service:  SNF [phone])  PCP: Lin Landsman, MD Patient Care Team: Lin Landsman, MD as PCP - General (Family Medicine) Larey Dresser, MD as PCP - Advanced Heart Failure (Cardiology) Jorge Ny, LCSW as Social Worker (Licensed Clinical Social Worker)  Extended Emergency Contact Information Primary Emergency Contact: [place], [place] Phone: [phone] Relation: Granddaughter Secondary Emergency Contact: Conners,Bill          [place] Phone: [phone] Relation: Friend     Allergies: Hydrocodone, Percocet [oxycodone-acetaminophen], Eggs or egg-derived products, Mercurial derivatives, Penicillins, and Quinine derivatives  Chief Complaint  Patient presents with   New Admit To SNF    [facility] admission to [facility]    HPI: Patient is an 85 y.o. female  1 with hypertension, diet-controlled diabetes, asthma, on 2 L to 3 L nasal cannula at home, stroke, GERD, OSA on CPAP, complete heart block, status post pacemaker placement, CHF with an EF of 35%, CKD 3, who presented with generalized weakness and dark emesis.  Patient also had decreased appetite and poor oral intake recently.  Patient was [facility] from [facility] where multiple problems were addressed.  In particular was for reported hematemesis, 5 episodes of coffee-ground emesis per her granddaughter.  Her hemoglobin remained stable.  Patient with known atrial fib with a chads vas score of 8 so in desperate need of oral anticoagulation.  As well as a history of stroke.  Eliquis was restarted.  Secondary to patient's failure to thrive hypertension atrial fib stroke and chronic systolic congestive heart failure with low ejection fraction it was felt that palliative care was the best route for patient.  Patient is admitted to  skilled nursing facility for OT/PT.  While at skilled nursing facility patient will be followed for atrial fibrillation treated with Eliquis, congestive heart failure treated with Bumex and glaucoma treated with Xalatan and Timoptic.  Past Medical History:  Diagnosis Date   Anemia    occassionally   Arthritis    all over.   Asthma    Allergixc reaction to cats.   Atrial fibrillation (HCC)    Bowel obstruction (HCC)    Chronic systolic CHF (congestive heart failure) (HCC)    EF 30-35% by cath, echo 2016 EF 50%   Complete heart block ([place])    a. s/p MDT CRTP pacemaker   DCM (dilated cardiomyopathy) ([place]) 08/16/2014   normal coronary arteries on cath with EF 30-45%.  EF now 50% by echo 11/2014   Diabetes mellitus 2006   Diet and exercise controlled.   Dyspnea    GERD (gastroesophageal reflux disease)    occ   Glaucoma 08/17/2018   Hypertension    Left tibial fracture 2007   OSA (obstructive sleep apnea) 10/23/2014   Moderate with AHI 21/hr   Osteoarthritis of right shoulder region 06/26/2013   Stroke [facility])     Past Surgical History:  Procedure Laterality Date   ABDOMINAL HYSTERECTOMY  1969   BI-VENTRICULAR PACEMAKER INSERTION N/A 10/07/2014   MDT CRTP implanted by Dr Delanna Ahmadi [facility] STUDY N/A 06/11/2014   Procedure: BRAVO [place];  Surgeon: Inda Castle, MD;  Location: [facility] ENDOS[facility];  Service: Endoscopy;  Laterality: N/A;   BUNIONECTOMY  1984   Bilateral   CARDIAC CATHETERIZATION  normal coronary arteries   [place]  2004   Bilateral   CATARACT EXTRACTION Right    early 2015   corn removal  1999   Bilateral feet   DILATION AND CURETTAGE OF UTERUS  1968   ESOPHAGOGASTRODUODENOSCOPY (EGD) WITH PROPOFOL N/A 06/11/2014   Procedure: ESOPHAGOGASTRODUODENOSCOPY (EGD) WITH PROPOFOL;  Surgeon: Inda Castle, MD;  Location: [facility];  Service: Endoscopy;  Laterality: N/A;   [place]   Surgery to fix Retainal detachment,  bilateral   JOINT REPLACEMENT     LEFT AND RIGHT HEART CATHETERIZATION WITH CORONARY ANGIOGRAM N/A 08/29/2014   Procedure: LEFT AND RIGHT HEART CATHETERIZATION WITH CORONARY ANGIOGRAM;  Surgeon: Peter M Martinique, MD;  Location[facility] CATH LAB;  Service: Cardiovascular;  Laterality: N/A;   MALONEY DILATION  06/11/2014   Procedure: Venia Minks DILATION;  Surgeon: Inda Castle, MD;  Location: [facility];  Service: Endoscopy;;   PILONIDAL CYST [place] CATH N/A 07/24/2018   Procedure: RIGHT HEART CATH;  Surgeon: Larey Dresser, MD;  Location: [place] CV LAB;  Service: Cardiovascular;  Laterality: N/A;   TONSILLECTOMY  1942   TOTAL HIP ARTHROPLASTY  11/16/2011   Procedure: TOTAL HIP ARTHROPLASTY ANTERIOR APPROACH;  Surgeon: Hessie Dibble, MD;  Location: [place];  Service: Orthopedics;  Laterality: Right;  DEPUY   TOTAL SHOULDER ARTHROPLASTY Right 06/26/2013   Procedure: TOTAL SHOULDER ARTHROPLASTY;  Surgeon: Johnny Bridge, MD;  Location: [place];  Service: Orthopedics;  Laterality: Right;    Allergies as of 06/06/2019      Reactions   Hydrocodone Other (See Comments)   Took away all energy and made her stop eating food for short time   Percocet [oxycodone-acetaminophen] Other (See Comments)   Too away all energy and made her stop eating food for short time   Eggs Or Egg-derived Products Hives, Rash   Mercurial Derivatives Itching, Rash   Penicillins Hives, Rash   Has patient had a PCN reaction causing immediate rash, facial/tongue/throat swelling, SOB or lightheadedness with hypotension: Yes Has patient had a PCN reaction causing severe rash involving mucus membranes or skin necrosis: No Has patient had a PCN reaction that required hospitalization: No Has patient had a PCN reaction occurring within the last 10 years: Yes If all of the above answers are "NO", then may proceed with Cephalosporin use.   Quinine Derivatives Hives, Rash      Medication List      Notice   This visit is during an admission. Changes to the med list made in this visit will be reflected in the After Visit Summary of the admission.    Current Outpatient Medications on File Prior to Visit  Medication Sig Dispense Refill   Acetaminophen (TYLENOL) 325 MG CAPS Take 650 mg by mouth daily as needed (for shoulder pain).      NON FORMULARY Diet: Dysphagia 3 diet     OXYGEN Inhale 2 L into the lungs continuous.      polyethylene glycol (MIRALAX / GLYCOLAX) 17 g packet Take 17 g by mouth daily. 14 each 0   senna (SENOKOT) 8.6 MG TABS tablet Take 1 tablet (8.6 mg total) by mouth at bedtime. 120 tablet 0   glucose blood test strip Use to test blood sugar twice daily ([employer] Smartview) (Patient not taking: Reported on 06/06/2019) 100 each 0   No current facility-administered medications on file prior to visit.      No orders of the  defined types were placed in this encounter.   Immunization History  Administered Date(s) Administered   Influenza-Unspecified 06/04/2019    Social History   Tobacco Use   Smoking status: Former Smoker    Packs/day: 1.00    Years: 45.00    Pack years: 45.00    Types: Cigarettes    Quit date: 07/24/1978    Years since quitting: 40.8   Smokeless tobacco: Never Used  Substance Use Topics   Alcohol use: Yes    Alcohol/week: 7.0 standard drinks    Types: 7 Glasses of wine per week    Comment: every day    Family history is   Family History  Problem Relation Age of Onset   Dementia Mother    Coronary artery disease Mother    Cancer Father        blood ? type   Diabetes Maternal Grandfather    Anuerysm Daughter        brain   Colon cancer Neg Hx       Review of Systems  DATA OBTAINED: from patient-limited; nursing-no acute concerns and then poor p.o. intake GENERAL:  no fevers, fatigue, appetite changes SKIN: No itching, or rash EYES: No eye pain, redness, discharge EARS: No earache, tinnitus, change in  hearing NOSE: No congestion, drainage or bleeding  MOUTH/THROAT: No mouth or tooth pain, No sore throat RESPIRATORY: No cough, wheezing, SOB CARDIAC: No chest pain, palpitations, lower extremity edema  GI: No abdominal pain, No N/V/D or constipation, No heartburn or reflux  GU: No dysuria, frequency or urgency, or incontinence  MUSCULOSKELETAL: No unrelieved bone/joint pain NEUROLOGIC: No headache, dizziness or focal weakness PSYCHIATRIC: No c/o anxiety or sadness   Vitals:   06/06/19 0956  BP: 110/60  Pulse: (!) 49  Resp: 17  Temp: 98.2 F (36.8 C)  SpO2: 98%    SpO2 Readings from Last 1 Encounters:  06/06/19 98%   Body mass index is 25.08 kg/m.     Physical Exam  GENERAL APPEARANCE: Alert, conversant,  No acute distress.  SKIN: No diaphoresis rash HEAD: Normocephalic, atraumatic  EYES: Conjunctiva/lids clear. Pupils round, reactive. EOMs intact.  EARS: External exam WNL, canals clear. Hearing grossly normal.  NOSE: No deformity or discharge.  MOUTH/THROAT: Lips w/o lesions  RESPIRATORY: Breathing is even, unlabored. Lung sounds are coarse CARDIOVASCULAR: Heart irregular no murmurs, rubs or gallops.  1+ peripheral edema.   GASTROINTESTINAL: Abdomen is soft, non-tender, not distended w/ normal bowel sounds. GENITOURINARY: Bladder non tender, not distended  MUSCULOSKELETAL: No abnormal joints or musculature NEUROLOGIC:  Cranial nerves 2-12 grossly intact. Moves all extremities  PSYCHIATRIC: Dementia, no behavioral issues  Patient Active Problem List   Diagnosis Date Noted   Chronic respiratory failure with hypoxia (HCC) 05/29/2019   Chronic constipation 05/29/2019   Chronic non-seasonal allergic rhinitis 05/29/2019   Cardiac amyloidosis (HCC)    FTT (failure to thrive) in adult 05/15/2019   Hematemesis 37/62/8315   Chronic systolic CHF (congestive heart failure) ([place]) 05/14/2019   Acute renal failure superimposed on stage 3 chronic kidney disease ([place])  05/14/2019   Fall 05/14/2019   Hyponatremia 05/14/2019   Hyperkalemia 05/14/2019   Obesity (BMI 30-39.9) 10/20/2018   Acute on chronic heart failure ([place]) 08/19/2018   Palliative care encounter    Palliative care by specialist    Acute respiratory failure with hypoxia ([place]) 08/18/2018   Diabetes ([place]) 08/18/2018   CKD (chronic kidney disease) 08/18/2018   Amyloidosis ([place]) 08/18/2018   Acute on chronic combined  systolic and diastolic heart failure (HCC) 08/17/2018   GERD (gastroesophageal reflux disease) 08/17/2018   Elevated troponin 08/17/2018   Glaucoma 08/17/2018   Hyperbilirubinemia 08/17/2018   CHF (congestive heart failure) ([place]) 07/18/2018   Gait abnormality 05/03/2017   Hypotension 04/14/2016   Hypotension due to drugs    Chronic anticoagulation    TIA (transient ischemic attack) 04/12/2016   UTI (lower urinary tract infection) 04/12/2016   AKI (acute kidney injury) ([place]) 04/12/2016   Atrial fibrillation ([place]) 09/12/2015   Occipital infarction ([place])    History of stroke 06/18/2015   Homonymous hemianopsia    Pacemaker 01/21/2015   OSA (obstructive sleep apnea) 10/23/2014   Dizziness 10/21/2014   Complete heart block (HCC) 10/06/2014   Benign essential HTN 10/04/2014   Acute on chronic systolic CHF (congestive heart failure) ([place]) 10/04/2014   Abnormal cardiovascular stress test 08/27/2014   Pulmonary HTN ([place]) 08/16/2014   DCM (dilated cardiomyopathy) ([place]) 08/16/2014   Cough 07/13/2014   Esophageal stricture 06/11/2014   Nonspecific (abnormal) findings on radiological and other examination of gastrointestinal tract 05/29/2014   Dyspnea 04/29/2014   Osteoarthritis of right shoulder region 06/26/2013   DJD (degenerative joint disease) of hip 11/16/2011    Class: Present on Admission      Labs reviewed: Basic Metabolic Panel:    Component Value Date/Time   NA 130 (L) 06/05/2019 0715   K 4.7 06/05/2019 0715   CL  94 (L) 06/05/2019 0715   CO2 23 06/05/2019 0715   GLUCOSE 82 06/05/2019 0715   BUN 38 (H) 06/05/2019 0715   CREATININE 1.70 (H) 06/05/2019 0715   CREATININE 0.98 (H) 12/29/2015 1057   CALCIUM 9.0 06/05/2019 0715   CALCIUM 9.0 02/08/2019 1243   PROT 8.2 (H) 05/14/2019 2112   ALBUMIN 3.6 05/14/2019 2112   AST 46 (H) 05/14/2019 2112   ALT 25 05/14/2019 2112   ALKPHOS 118 05/14/2019 2112   BILITOT 3.9 (H) 05/14/2019 2112   GFRNONAA 27 (L) 06/05/2019 0715   GFRAA 31 (L) 06/05/2019 0715    Recent Labs    05/14/19 2112  05/25/19 0330 05/28/19 0630 05/30/19 0730 06/05/19 0715  NA 130*   < > 129* 133*  --  130*  K 5.7*   < > 4.7 3.3* 3.5 4.7  CL 98   < > 94* 95*  --  94*  CO2 20*   < > 24 26  --  23  GLUCOSE 112*   < > 160* 125*  --  82  BUN 60*   < > 48* 42*  --  38*  CREATININE 1.71*   < > 1.81* 1.53*  --  1.70*  CALCIUM 9.3   < > 8.5* 8.4*  --  9.0  MG 2.8*  --   --   --   --   --    < > = values in this interval not displayed.   Liver Function Tests: Recent Labs    08/19/18 0440 08/20/18 0546 05/14/19 2112  AST 25 24 46*  ALT 12 13 25   ALKPHOS 71 78 118  BILITOT 3.6* 3.4* 3.9*  PROT 6.6 7.3 8.2*  ALBUMIN 3.2* 3.4* 3.6   Recent Labs    06/24/18 2038 05/14/19 2112  LIPASE 40 31   No results for input(s): AMMONIA in the last 8760 hours. CBC: Recent Labs    05/14/19 1615  05/19/19 0437 05/22/19 0324 05/25/19 0330  WBC 3.2*   < > 3.3* 3.5* 3.4*  NEUTROABS 2.2  --   --  2.4 2.4  HGB 15.5*   < > 13.6 12.9 12.4  HCT 46.8*   < > 41.7 39.0 38.2  MCV 91.6   < > 89.5 89.4 89.5  PLT 162   < > 143* 123* 121*   < > = values in this interval not displayed.   Lipid No results for input(s): CHOL, HDL, LDLCALC, TRIG in the last 8760 hours.  Cardiac Enzymes: Recent Labs    08/17/18 2017 08/18/18 0204 08/18/18 0701 05/15/19 0516  CKTOTAL  --   --   --  43  TROPONINI 0.29* 0.28* 0.25*  --    BNP: Recent Labs    08/17/18 1332 10/09/18 1238 05/15/19 0516    BNP 2,006.0* 1,424.7* 1,217.6*   Lab Results  Component Value Date   MICROALBUR 5.9 (H) 02/08/2019   Lab Results  Component Value Date   HGBA1C 5.3 07/25/2018   Lab Results  Component Value Date   TSH 1.46 04/29/2014   No results found for: VITAMINB12 No results found for: FOLATE No results found for: IRON, TIBC, FERRITIN  Imaging and Procedures obtained prior to SNF admission: No results found.   Not all labs, radiology exams or other studies done during hospitalization come through on my EPIC note; however they are reviewed by me.    Assessment and Plan  Reported hematemesis/GERD- per granddaughter 5 episodes of coffee-ground emesis; hemoglobin stable at 12.4; vomiting suspected to be secondary to gastroparesis; Zofran was added for nausea SNF-admitted for OT/PT; continue Protonix 40 mg daily and Zofran for nausea; Eliquis for atrial flutter has been restarted; follow CBC  Atrial fibrillation- chads Vascor 8, patient definitely needs oral anticoagulation SNF- hemoglobin being stable patient was restarted on Eliquis 2.5 mg twice daily  Chronic systolic congestive heart failure to do echo eleven 22/2019 showed EF 35 to 40%; Bumex and metolazone initially held due to acute renal failure but Bumex was restarted SNF- continue Bumex 4 mg twice daily; follow BMP  Acute renal failure on chronic kidney disease; baseline creatinine 1.2-1.3 and discharged creatinine near baseline SNF-follow-up BMP  Hypertension SNF- continue Bumex 4 mg twice daily  Failure to thrive SNF- continue dysphagia 3 diet; continue supportive care; patient is palliative care     Time spent greater than 45 minutes;> 50% of time with patient was spent reviewing records, labs, tests and studies, counseling and developing plan of care  Hennie Duos, MD

## 2019-06-07 ENCOUNTER — Other Ambulatory Visit: Payer: Self-pay

## 2019-06-08 ENCOUNTER — Encounter: Payer: Self-pay | Admitting: Adult Health

## 2019-06-08 ENCOUNTER — Non-Acute Institutional Stay (SKILLED_NURSING_FACILITY): Payer: Medicare HMO | Admitting: Adult Health

## 2019-06-08 ENCOUNTER — Other Ambulatory Visit: Payer: Self-pay | Admitting: Adult Health

## 2019-06-08 DIAGNOSIS — J9611 Chronic respiratory failure with hypoxia: Secondary | ICD-10-CM

## 2019-06-08 DIAGNOSIS — I5022 Chronic systolic (congestive) heart failure: Secondary | ICD-10-CM

## 2019-06-08 DIAGNOSIS — N170 Acute kidney failure with tubular necrosis: Secondary | ICD-10-CM

## 2019-06-08 DIAGNOSIS — N183 Chronic kidney disease, stage 3 unspecified: Secondary | ICD-10-CM | POA: Diagnosis not present

## 2019-06-08 MED ORDER — BUMETANIDE 2 MG PO TABS: 4.0000 mg | ORAL_TABLET | Freq: Two times a day (BID) | ORAL | 0 refills | Status: DC

## 2019-06-08 MED ORDER — APIXABAN 2.5 MG PO TABS: 2.5000 mg | ORAL_TABLET | Freq: Two times a day (BID) | ORAL | 0 refills | Status: DC

## 2019-06-08 MED ORDER — TIMOLOL MALEATE 0.5 % OP SOLN: 1.0000 [drp] | Freq: Every day | OPHTHALMIC | 0 refills | Status: DC

## 2019-06-08 MED ORDER — LATANOPROST 0.005 % OP SOLN: 1.0000 [drp] | Freq: Every day | OPHTHALMIC | 0 refills | Status: DC

## 2019-06-08 MED ORDER — FEXOFENADINE HCL 180 MG PO TABS: 180.0000 mg | ORAL_TABLET | Freq: Every day | ORAL | 0 refills | Status: DC

## 2019-06-08 MED ORDER — PANTOPRAZOLE SODIUM 40 MG PO TBEC: 40.0000 mg | DELAYED_RELEASE_TABLET | Freq: Every day | ORAL | 0 refills | Status: DC

## 2019-06-08 MED ORDER — DICLOFENAC SODIUM 1 % TD GEL: 2.0000 g | Freq: Three times a day (TID) | TRANSDERMAL | 0 refills | Status: DC | PRN

## 2019-06-08 MED ORDER — HYDROCORTISONE 1 % EX CREA: TOPICAL_CREAM | Freq: Every day | CUTANEOUS | 0 refills | Status: DC | PRN

## 2019-06-08 NOTE — Progress Notes (Signed)
Location:    [place] Room Number: 106/D Place of Service:  SNF (31)   CODE STATUS: Full Code  Allergies  Allergen Reactions  . Hydrocodone Other (See Comments)    Took away all energy and made her stop eating food for short time  . Percocet [Oxycodone-Acetaminophen] Other (See Comments)    Too away all energy and made her stop eating food for short time  . Eggs Or Egg-Derived Products Hives and Rash  . Mercurial Derivatives Itching and Rash  . Penicillins Hives and Rash    Has patient had a PCN reaction causing immediate rash, facial/tongue/throat swelling, SOB or lightheadedness with hypotension: Yes Has patient had a PCN reaction causing severe rash involving mucus membranes or skin necrosis: No Has patient had a PCN reaction that required hospitalization: No Has patient had a PCN reaction occurring within the last 10 years: Yes If all of the above answers are "NO", then may proceed with Cephalosporin use.  . Quinine Derivatives Hives and Rash    Chief Complaint  Patient presents with  . Discharge Note    Discharge Visit,    HPI:  She is being discharged to home with home health for pt/ot. She will not need dme. She will need her prescriptions written and will need to follow up with her medical provider. She had been for increased weakness; chf; afib. She was admitted to this facility for short term rehab. She feels as though she is ready to return home.   Past Medical History:  Diagnosis Date  . Anemia    occassionally  . Arthritis    all over.  . Asthma    Allergixc reaction to cats.  . Atrial fibrillation ([place])   . Bowel obstruction ([place])   . Chronic systolic CHF (congestive heart failure) (HCC)    EF 30-35% by cath, echo 2016 EF 50%  . Complete heart block (HCC)    a. s/p MDT CRTP pacemaker  . DCM (dilated cardiomyopathy) ([place]) 08/16/2014   normal coronary arteries on cath with EF 30-45%.  EF now 50% by echo 11/2014  . Diabetes  mellitus 2006   Diet and exercise controlled.  Marland Kitchen Dyspnea   . GERD (gastroesophageal reflux disease)    occ  . Glaucoma 08/17/2018  . Hypertension   . Left tibial fracture 2007  . OSA (obstructive sleep apnea) 10/23/2014   Moderate with AHI 21/hr  . Osteoarthritis of right shoulder region 06/26/2013  . Stroke [facility])     Past Surgical History:  Procedure Laterality Date  . ABDOMINAL HYSTERECTOMY  1969  . BI-VENTRICULAR PACEMAKER INSERTION N/A 10/07/2014   MDT CRTP implanted by Dr Lovena Le  . BRAVO [place] STUDY N/A 06/11/2014   Procedure: BRAVO [place] STUDY;  Surgeon: Inda Castle, MD;  Location: [facility] ENDOSCOPY;  Service: Endoscopy;  Laterality: N/A;  . BUNIONECTOMY  1984   Bilateral  . CARDIAC CATHETERIZATION     normal coronary arteries  . Carpal [place]  2004   Bilateral  . CATARACT EXTRACTION Right    early 2015  . corn removal  1999   Bilateral feet  . DILATION AND CURETTAGE OF UTERUS  1968  . ESOPHAGOGASTRODUODENOSCOPY (EGD) WITH PROPOFOL N/A 06/11/2014   Procedure: ESOPHAGOGASTRODUODENOSCOPY (EGD) WITH PROPOFOL;  Surgeon: Inda Castle, MD;  Location: [facility] ENDOSCOPY;  Service: Endoscopy;  Laterality: N/A;  . [place]   Surgery to fix Retainal detachment, bilateral  . JOINT REPLACEMENT    . LEFT AND  RIGHT HEART CATHETERIZATION WITH CORONARY ANGIOGRAM N/A 08/29/2014   Procedure: LEFT AND RIGHT HEART CATHETERIZATION WITH CORONARY ANGIOGRAM;  Surgeon: Peter M Martinique, MD;  Location: [facility] C[facility] LAB;  Service: Cardiovascular;  Laterality: N/A;  . MALONEY DILATION  06/11/2014   Procedure: Venia Minks DILATION;  Surgeon: Inda Castle, MD;  Location: [facility] ENDOSCOPY;  Service: Endoscopy;;  . PILONIDAL CYST EXCISION  1959  . RIGHT HEART CATH N/A 07/24/2018   Procedure: RIGHT HEART CATH;  Surgeon: Larey Dresser, MD;  Location: [facility] CV LAB;  Service: Cardiovascular;  Laterality: N/A;  . TONSILLECTOMY  1942  . TOTAL HIP ARTHROPLASTY  11/16/2011   Procedure: TOTAL HIP ARTHROPLASTY  ANTERIOR APPROACH;  Surgeon: Hessie Dibble, MD;  Location: [place];  Service: Orthopedics;  Laterality: Right;  DEPUY  . TOTAL SHOULDER ARTHROPLASTY Right 06/26/2013   Procedure: TOTAL SHOULDER ARTHROPLASTY;  Surgeon: Johnny Bridge, MD;  Location: [place];  Service: Orthopedics;  Laterality: Right;    Social History   Socioeconomic History  . Marital status: Widowed    Spouse name: Not on file  . Number of children: 0  . Years of education: Not on file  . Highest education level: Doctorate  Occupational History  . Occupation: retired  Scientific laboratory technician  . Financial resource strain: Not hard at all  . Food insecurity    Worry: Never true    Inability: Never true  . Transportation needs    Medical: No    Non-medical: No  Tobacco Use  . Smoking status: Former Smoker    Packs/day: 1.00    Years: 45.00    Pack years: 45.00    Types: Cigarettes    Quit date: 07/24/1978    Years since quitting: 40.9  . Smokeless tobacco: Never Used  Substance and Sexual Activity  . Alcohol use: Yes    Alcohol/week: 7.0 standard drinks    Types: 7 Glasses of wine per week    Comment: every day  . Drug use: No  . Sexual activity: Not Currently  Lifestyle  . Physical activity    Days per week: Not on file    Minutes per session: Not on file  . Stress: Not on file  Relationships  . Social Herbalist on phone: Not on file    Gets together: Not on file    Attends religious service: Not on file    Active member of club or organization: Not on file    Attends meetings of clubs or organizations: Not on file    Relationship status: Not on file  . Intimate partner violence    Fear of current or ex partner: Not on file    Emotionally abused: Not on file    Physically abused: Not on file    Forced sexual activity: Not on file  Other Topics Concern  . Not on file  Social History Narrative   Lives alone   Family History  Problem Relation Age of Onset  . Dementia Mother   . Coronary  artery disease Mother   . Cancer Father        blood ? type  . Diabetes Maternal Grandfather   . Anuerysm Daughter        brain  . Colon cancer Neg Hx       VITAL SIGNS BP 110/60   Pulse 60   Temp 98.2 F (36.8 C) (Oral)   Resp 17   Ht 5\' 3"  (1.6 m)   Wt  143 lb (64.9 kg)   SpO2 96%   BMI 25.33 kg/m   Outpatient Encounter Medications as of 06/08/2019  Medication Sig  . Acetaminophen (TYLENOL) 325 MG CAPS Take 650 mg by mouth daily as needed (for shoulder pain).   Marland Kitchen apixaban (ELIQUIS) 2.5 MG TABS tablet Take 1 tablet (2.5 mg total) by mouth 2 (two) times daily.  . bumetanide (BUMEX) 2 MG tablet Take 2 tablets (4 mg total) by mouth 2 (two) times daily.  . diclofenac sodium (VOLTAREN) 1 % GEL Apply 2 g topically 3 (three) times daily as needed (pain, inflammation).  . fexofenadine (ALLEGRA) 180 MG tablet Take 1 tablet (180 mg total) by mouth daily.  . hydrocortisone cream 1 % Apply topically daily as needed for itching. Cream; amt: sparingly; topical Special Instructions: apply to itchy skin Once A Day - PRN PRN 1  . latanoprost (XALATAN) 0.005 % ophthalmic solution Place 1 drop into both eyes at bedtime.  . NON FORMULARY Diet: Dysphagia 3 diet  . OXYGEN Inhale 2 L into the lungs continuous.   . pantoprazole (PROTONIX) 40 MG tablet Take 1 tablet (40 mg total) by mouth daily.  . polyethylene glycol (MIRALAX / GLYCOLAX) 17 g packet Take 17 g by mouth daily.  Marland Kitchen senna (SENOKOT) 8.6 MG TABS tablet Take 1 tablet (8.6 mg total) by mouth at bedtime.  . timolol (TIMOPTIC) 0.5 % ophthalmic solution Place 1 drop into both eyes at bedtime.  Marland Kitchen glucose blood test strip Use to test blood sugar twice daily ([employer] [facility]) (Patient not taking: Reported on 06/06/2019)   No facility-administered encounter medications on file as of 06/08/2019.      SIGNIFICANT DIAGNOSTIC EXAMS   PREVIOUS;   05-14-19: chest x-ray: No active disease. Stable cardiomegaly.   05-14-19: ct of head:  1. No CT  evidence for acute intracranial abnormality. 2. Atrophy and small vessel ischemic changes of the white matter  05-14-19: ct of abdomen and pelvis: Lower pole renal atrophy on the left consistent with prior infarct. Mild changes of anasarca. Diverticulosis without diverticulitis  NO NEW EXAMS.   LABS REVIEWED PREVIOUS;   05-14-19: wbc 3.2; hgb 15.5; hct 46.8; mcv 91.6 ;plt 162; glucose 112; bun 60; creat 1.71; k+ 5.7; na++ 130; ca 9.3; total bili 3.9; mag 2.8; albumin 3.6 05-15-19: BNP 1217.6 05-16-19: wbc 3.5; hgb 13.9; hct 42.0; mvc 91.5 plt 141; glucose 146; bun 49; creat 1.59; k+ 4.5; ca 8.8 05-19-19: wbc 3.3; hgb 13.6; hct 41.7; mcv 89.5 plt 143; glucose 110; bun 38; creat 1.40 ;k+ 4.6; an++ 131; ca 8.4 05-25-19: wbc 3.4; hgb 12.4; hct 38.2; mcv 89.5 plt 121; glucose 160; bun 48; creat 1.181; k+ 4.7; na++ 129; ca 8.5 05-28-19: glucose 125; bun 42; creat 1.53; k+ 3.3; na++ 133; ca 8.4  05-30-19: k+ 3.5 06-05-19: glucose 83; bun 38; creat 1.70; k+ 4.7; na++ 140; ca 9.0  NO NEW LABS.    Review of Systems  Constitutional: Negative for malaise/fatigue.  Respiratory: Negative for cough and shortness of breath.   Cardiovascular: Negative for chest pain, palpitations and leg swelling.  Gastrointestinal: Negative for abdominal pain, constipation and heartburn.  Musculoskeletal: Negative for back pain, joint pain and myalgias.  Skin: Negative.   Neurological: Negative for dizziness.  Psychiatric/Behavioral: The patient is not nervous/anxious.     Physical Exam Constitutional:      General: She is not in acute distress.    Appearance: She is well-developed. She is not diaphoretic.  Eyes:  Comments: History of bilateral cataracts History of bilateral retinal detachment repair    Neck:     Musculoskeletal: Neck supple.     Thyroid: No thyromegaly.  Cardiovascular:     Rate and Rhythm: Normal rate and regular rhythm.     Pulses: Normal pulses.     Heart sounds: Normal heart sounds.   Pulmonary:     Effort: Pulmonary effort is normal. No respiratory distress.     Breath sounds: Normal breath sounds.  Abdominal:     General: Bowel sounds are normal. There is no distension.     Palpations: Abdomen is soft.     Tenderness: There is no abdominal tenderness.     Comments: 02 dependent   Musculoskeletal:     Right lower leg: Edema present.     Left lower leg: Edema present.     Comments:  1+ bilateral lower extremity edema 2013: right hip arthroplasty 2014: right shoulder arthroplasty      Lymphadenopathy:     Cervical: No cervical adenopathy.  Skin:    General: Skin is warm and dry.  Neurological:     Mental Status: She is alert and oriented to person, place, and time.  Psychiatric:        Mood and Affect: Mood normal.     ASSESSMENT/ PLAN:   Patient is being discharged with the following home health services:  Pt/ot to evaluate and treat as indicated for gait balance strength adl training   Patient is being discharged with the following durable medical equipment:  None needed   Patient has been advised to f/u with their PCP in 1-2 weeks to bring them up to date on their rehab stay.  Social services at facility was responsible for arranging this appointment.  Pt was provided with a 30 day supply of prescriptions for medications and refills must be obtained from their PCP.  For controlled substances, a more limited supply may be provided adequate until PCP appointment only.  A 30 day supply of her prescription medications have been sent to [facility] on [address]  Time spent with patient: 35 minutes: home health needs; dme; medications.    Ok Edwards NP [facility]  Contact [phone] Monday through Friday 8am- 5pm  After hours call [phone]

## 2019-06-09 ENCOUNTER — Encounter: Payer: Self-pay | Admitting: Internal Medicine

## 2019-06-11 ENCOUNTER — Other Ambulatory Visit (HOSPITAL_COMMUNITY): Payer: Self-pay

## 2019-06-11 ENCOUNTER — Other Ambulatory Visit: Payer: Self-pay

## 2019-06-11 ENCOUNTER — Emergency Department (HOSPITAL_COMMUNITY): Payer: Medicare HMO

## 2019-06-11 ENCOUNTER — Telehealth (HOSPITAL_COMMUNITY): Payer: Self-pay | Admitting: *Deleted

## 2019-06-11 ENCOUNTER — Inpatient Hospital Stay (HOSPITAL_COMMUNITY)
Admission: EM | Admit: 2019-06-11 | Discharge: 2019-06-17 | DRG: 291 | Disposition: A | Discharge status: Hospice | Payer: Medicare HMO | Attending: Internal Medicine | Admitting: Internal Medicine

## 2019-06-11 DIAGNOSIS — Z96641 Presence of right artificial hip joint: Secondary | ICD-10-CM | POA: Diagnosis present

## 2019-06-11 DIAGNOSIS — I110 Hypertensive heart disease with heart failure: Secondary | ICD-10-CM | POA: Diagnosis not present

## 2019-06-11 DIAGNOSIS — K219 Gastro-esophageal reflux disease without esophagitis: Secondary | ICD-10-CM | POA: Diagnosis present

## 2019-06-11 DIAGNOSIS — R531 Weakness: Secondary | ICD-10-CM

## 2019-06-11 DIAGNOSIS — J9611 Chronic respiratory failure with hypoxia: Secondary | ICD-10-CM | POA: Diagnosis present

## 2019-06-11 DIAGNOSIS — J90 Pleural effusion, not elsewhere classified: Secondary | ICD-10-CM | POA: Diagnosis not present

## 2019-06-11 DIAGNOSIS — E871 Hypo-osmolality and hyponatremia: Secondary | ICD-10-CM | POA: Diagnosis present

## 2019-06-11 DIAGNOSIS — Z20828 Contact with and (suspected) exposure to other viral communicable diseases: Secondary | ICD-10-CM | POA: Diagnosis not present

## 2019-06-11 DIAGNOSIS — I10 Essential (primary) hypertension: Secondary | ICD-10-CM | POA: Diagnosis present

## 2019-06-11 DIAGNOSIS — Z7901 Long term (current) use of anticoagulants: Secondary | ICD-10-CM

## 2019-06-11 DIAGNOSIS — I43 Cardiomyopathy in diseases classified elsewhere: Secondary | ICD-10-CM | POA: Diagnosis present

## 2019-06-11 DIAGNOSIS — Z880 Allergy status to penicillin: Secondary | ICD-10-CM

## 2019-06-11 DIAGNOSIS — R0602 Shortness of breath: Secondary | ICD-10-CM

## 2019-06-11 DIAGNOSIS — I509 Heart failure, unspecified: Secondary | ICD-10-CM

## 2019-06-11 DIAGNOSIS — Z809 Family history of malignant neoplasm, unspecified: Secondary | ICD-10-CM

## 2019-06-11 DIAGNOSIS — Z6825 Body mass index (BMI) 25.0-25.9, adult: Secondary | ICD-10-CM

## 2019-06-11 DIAGNOSIS — Z888 Allergy status to other drugs, medicaments and biological substances status: Secondary | ICD-10-CM

## 2019-06-11 DIAGNOSIS — M159 Polyosteoarthritis, unspecified: Secondary | ICD-10-CM | POA: Diagnosis present

## 2019-06-11 DIAGNOSIS — Z91012 Allergy to eggs: Secondary | ICD-10-CM

## 2019-06-11 DIAGNOSIS — H409 Unspecified glaucoma: Secondary | ICD-10-CM | POA: Diagnosis present

## 2019-06-11 DIAGNOSIS — E854 Organ-limited amyloidosis: Secondary | ICD-10-CM | POA: Diagnosis present

## 2019-06-11 DIAGNOSIS — I442 Atrioventricular block, complete: Secondary | ICD-10-CM | POA: Diagnosis present

## 2019-06-11 DIAGNOSIS — Z8249 Family history of ischemic heart disease and other diseases of the circulatory system: Secondary | ICD-10-CM

## 2019-06-11 DIAGNOSIS — Z833 Family history of diabetes mellitus: Secondary | ICD-10-CM

## 2019-06-11 DIAGNOSIS — I471 Supraventricular tachycardia: Secondary | ICD-10-CM | POA: Diagnosis not present

## 2019-06-11 DIAGNOSIS — Z96611 Presence of right artificial shoulder joint: Secondary | ICD-10-CM | POA: Diagnosis present

## 2019-06-11 DIAGNOSIS — R0600 Dyspnea, unspecified: Secondary | ICD-10-CM | POA: Diagnosis present

## 2019-06-11 DIAGNOSIS — I130 Hypertensive heart and chronic kidney disease with heart failure and stage 1 through stage 4 chronic kidney disease, or unspecified chronic kidney disease: Secondary | ICD-10-CM | POA: Diagnosis not present

## 2019-06-11 DIAGNOSIS — R627 Adult failure to thrive: Secondary | ICD-10-CM | POA: Diagnosis present

## 2019-06-11 DIAGNOSIS — I5043 Acute on chronic combined systolic (congestive) and diastolic (congestive) heart failure: Secondary | ICD-10-CM

## 2019-06-11 DIAGNOSIS — N189 Chronic kidney disease, unspecified: Secondary | ICD-10-CM | POA: Diagnosis present

## 2019-06-11 DIAGNOSIS — R609 Edema, unspecified: Secondary | ICD-10-CM | POA: Diagnosis not present

## 2019-06-11 DIAGNOSIS — I272 Pulmonary hypertension, unspecified: Secondary | ICD-10-CM | POA: Diagnosis present

## 2019-06-11 DIAGNOSIS — R0902 Hypoxemia: Secondary | ICD-10-CM | POA: Diagnosis not present

## 2019-06-11 DIAGNOSIS — N183 Chronic kidney disease, stage 3 unspecified: Secondary | ICD-10-CM | POA: Diagnosis present

## 2019-06-11 DIAGNOSIS — I482 Chronic atrial fibrillation, unspecified: Secondary | ICD-10-CM | POA: Diagnosis present

## 2019-06-11 DIAGNOSIS — G4733 Obstructive sleep apnea (adult) (pediatric): Secondary | ICD-10-CM | POA: Diagnosis present

## 2019-06-11 DIAGNOSIS — N185 Chronic kidney disease, stage 5: Secondary | ICD-10-CM | POA: Diagnosis present

## 2019-06-11 DIAGNOSIS — Z950 Presence of cardiac pacemaker: Secondary | ICD-10-CM

## 2019-06-11 DIAGNOSIS — Z7189 Other specified counseling: Secondary | ICD-10-CM

## 2019-06-11 DIAGNOSIS — I420 Dilated cardiomyopathy: Secondary | ICD-10-CM | POA: Diagnosis present

## 2019-06-11 DIAGNOSIS — I639 Cerebral infarction, unspecified: Secondary | ICD-10-CM | POA: Diagnosis present

## 2019-06-11 DIAGNOSIS — E1122 Type 2 diabetes mellitus with diabetic chronic kidney disease: Secondary | ICD-10-CM | POA: Diagnosis present

## 2019-06-11 DIAGNOSIS — I4891 Unspecified atrial fibrillation: Secondary | ICD-10-CM | POA: Diagnosis present

## 2019-06-11 DIAGNOSIS — Z8673 Personal history of transient ischemic attack (TIA), and cerebral infarction without residual deficits: Secondary | ICD-10-CM

## 2019-06-11 DIAGNOSIS — Z87891 Personal history of nicotine dependence: Secondary | ICD-10-CM

## 2019-06-11 DIAGNOSIS — Z885 Allergy status to narcotic agent status: Secondary | ICD-10-CM

## 2019-06-11 DIAGNOSIS — N179 Acute kidney failure, unspecified: Secondary | ICD-10-CM | POA: Diagnosis present

## 2019-06-11 DIAGNOSIS — I5023 Acute on chronic systolic (congestive) heart failure: Secondary | ICD-10-CM | POA: Diagnosis present

## 2019-06-11 DIAGNOSIS — Z9981 Dependence on supplemental oxygen: Secondary | ICD-10-CM

## 2019-06-11 LAB — CBC
HCT: 43 % (ref 36.0–46.0)
Hemoglobin: 13.8 g/dL (ref 12.0–15.0)
MCH: 29.2 pg (ref 26.0–34.0)
MCHC: 32.1 g/dL (ref 30.0–36.0)
MCV: 91.1 fL (ref 80.0–100.0)
Platelets: 147 10*3/uL — ABNORMAL LOW (ref 150–400)
RBC: 4.72 MIL/uL (ref 3.87–5.11)
RDW: 18.1 % — ABNORMAL HIGH (ref 11.5–15.5)
WBC: 2.7 10*3/uL — ABNORMAL LOW (ref 4.0–10.5)
nRBC: 0.7 % — ABNORMAL HIGH (ref 0.0–0.2)

## 2019-06-11 LAB — BASIC METABOLIC PANEL
Anion gap: 13 (ref 5–15)
BUN: 32 mg/dL — ABNORMAL HIGH (ref 8–23)
CO2: 22 mmol/L (ref 22–32)
Calcium: 8.9 mg/dL (ref 8.9–10.3)
Chloride: 95 mmol/L — ABNORMAL LOW (ref 98–111)
Creatinine, Ser: 1.92 mg/dL — ABNORMAL HIGH (ref 0.44–1.00)
GFR calc Af Amer: 27 mL/min — ABNORMAL LOW (ref >60)
GFR calc non Af Amer: 23 mL/min — ABNORMAL LOW (ref >60)
Glucose, Bld: 126 mg/dL — ABNORMAL HIGH (ref 70–99)
Potassium: 4.1 mmol/L (ref 3.5–5.1)
Sodium: 130 mmol/L — ABNORMAL LOW (ref 135–145)

## 2019-06-11 LAB — TROPONIN I (HIGH SENSITIVITY)
Troponin I (High Sensitivity): 203 ng/L (ref <18)
Troponin I (High Sensitivity): 194 ng/L (ref <18)

## 2019-06-11 LAB — BRAIN NATRIURETIC PEPTIDE: B Natriuretic Peptide: 1237.5 pg/mL — ABNORMAL HIGH (ref 0.0–100.0)

## 2019-06-11 MED ORDER — FUROSEMIDE 10 MG/ML IJ SOLN
40.0000 mg | Freq: Once | INTRAMUSCULAR | Status: AC
  Administered 2019-06-11: 16:00:00 40 mg via INTRAVENOUS
  Filled 2019-06-11: qty 4

## 2019-06-11 MED ORDER — APIXABAN 2.5 MG PO TABS
2.5000 mg | ORAL_TABLET | Freq: Two times a day (BID) | ORAL | Status: DC
  Administered 2019-06-11 – 2019-06-17 (×12): 2.5 mg via ORAL
  Filled 2019-06-11 (×13): qty 1

## 2019-06-11 MED ORDER — ACETAMINOPHEN 325 MG PO TABS
650.0000 mg | ORAL_TABLET | Freq: Every day | ORAL | Status: DC | PRN
  Administered 2019-06-14: 650 mg via ORAL
  Filled 2019-06-11: qty 2

## 2019-06-11 MED ORDER — SODIUM CHLORIDE 0.9% FLUSH
3.0000 mL | Freq: Once | INTRAVENOUS | Status: AC
  Administered 2019-06-11: 16:00:00 3 mL via INTRAVENOUS

## 2019-06-11 MED ORDER — LORATADINE 10 MG PO TABS
10.0000 mg | ORAL_TABLET | Freq: Every day | ORAL | Status: DC
  Administered 2019-06-11 – 2019-06-17 (×7): 10 mg via ORAL
  Filled 2019-06-11 (×7): qty 1

## 2019-06-11 MED ORDER — SENNA 8.6 MG PO TABS
1.0000 | ORAL_TABLET | Freq: Every day | ORAL | Status: DC
  Administered 2019-06-11 – 2019-06-16 (×6): 8.6 mg via ORAL
  Filled 2019-06-11 (×6): qty 1

## 2019-06-11 MED ORDER — PANTOPRAZOLE SODIUM 40 MG PO TBEC
40.0000 mg | DELAYED_RELEASE_TABLET | Freq: Every day | ORAL | Status: DC
  Administered 2019-06-11 – 2019-06-17 (×7): 40 mg via ORAL
  Filled 2019-06-11 (×7): qty 1

## 2019-06-11 MED ORDER — POLYETHYLENE GLYCOL 3350 17 G PO PACK
17.0000 g | PACK | Freq: Every day | ORAL | Status: DC
  Administered 2019-06-11 – 2019-06-17 (×7): 17 g via ORAL
  Filled 2019-06-11 (×7): qty 1

## 2019-06-11 MED ORDER — ONDANSETRON HCL 4 MG/2ML IJ SOLN: 4.0000 mg | Freq: Four times a day (QID) | INTRAMUSCULAR | Status: DC | PRN

## 2019-06-11 MED ORDER — ONDANSETRON HCL 4 MG PO TABS: 4.0000 mg | ORAL_TABLET | Freq: Four times a day (QID) | ORAL | Status: DC | PRN

## 2019-06-11 NOTE — H&P (Signed)
[facility]                                                                                    Patient Demographics  Kim Mullins, is a 85 y.o. female  CSN: [id number]  MRN: [id number]  DOB - 30-Mar-1930  Admit Date - 06/11/2019  Outpatient Primary MD for the patient is Lin Landsman, MD   With History of -  Past Medical History:  Diagnosis Date  . Anemia    occassionally  . Arthritis    all over.  . Asthma    Allergixc reaction to cats.  . Atrial fibrillation ([place])   . Bowel obstruction ([place])   . Chronic systolic CHF (congestive heart failure) (HCC)    EF 30-35% by cath, echo 2016 EF 50%  . Complete heart block (HCC)    a. s/p MDT CRTP pacemaker  . DCM (dilated cardiomyopathy) ([place]) 08/16/2014   normal coronary arteries on cath with EF 30-45%.  EF now 50% by echo 11/2014  . Diabetes mellitus 2006   Diet and exercise controlled.  Marland Kitchen Dyspnea   . GERD (gastroesophageal reflux disease)    occ  . Glaucoma 08/17/2018  . Hypertension   . Left tibial fracture 2007  . OSA (obstructive sleep apnea) 10/23/2014   Moderate with AHI 21/hr  . Osteoarthritis of right shoulder region 06/26/2013  . Stroke [facility])       Past Surgical History:  Procedure Laterality Date  . ABDOMINAL HYSTERECTOMY  1969  . BI-VENTRICULAR PACEMAKER INSERTION N/A 10/07/2014   MDT CRTP implanted by Dr Lovena Le  . BRAVO [place] STUDY N/A 06/11/2014   Procedure: BRAVO [place] STUDY;  Surgeon: Inda Castle, MD;  Location: [facility] ENDOSCOPY;  Service: Endoscopy;  Laterality: N/A;  . BUNIONECTOMY  1984   Bilateral  . CARDIAC CATHETERIZATION     normal coronary arteries  . Carpal Tunnell  2004   Bilateral  . CATARACT EXTRACTION Right    early 2015  . corn removal  1999   Bilateral feet  . DILATION AND CURETTAGE OF UTERUS  1968  . ESOPHAGOGASTRODUODENOSCOPY (EGD) WITH PROPOFOL N/A 06/11/2014   Procedure: ESOPHAGOGASTRODUODENOSCOPY (EGD) WITH PROPOFOL;  Surgeon: Inda Castle, MD;  Location: [facility]  ENDOSCOPY;  Service: Endoscopy;  Laterality: N/A;  . [place]   Surgery to fix Retainal detachment, bilateral  . JOINT REPLACEMENT    . LEFT AND RIGHT HEART CATHETERIZATION WITH CORONARY ANGIOGRAM N/A 08/29/2014   Procedure: LEFT AND RIGHT HEART CATHETERIZATION WITH CORONARY ANGIOGRAM;  Surgeon: Peter M Martinique, MD;  Location: [facility] CATH LAB;  Service: Cardiovascular;  Laterality: N/A;  . MALONEY DILATION  06/11/2014   Procedure: Venia Minks DILATION;  Surgeon: Inda Castle, MD;  Location: [facility] ENDOSCOPY;  Service: Endoscopy;;  . PILONIDAL CYST EXCISION  1959  . RIGHT HEART CATH N/A 07/24/2018   Procedure: RIGHT HEART CATH;  Surgeon: Larey Dresser, MD;  Location: [place] CV LAB;  Service: Cardiovascular;  Laterality: N/A;  . TONSILLECTOMY  1942  . TOTAL HIP ARTHROPLASTY  11/16/2011   Procedure: TOTAL HIP ARTHROPLASTY ANTERIOR APPROACH;  Surgeon: Hessie Dibble, MD;  Location: [place];  Service:  Orthopedics;  Laterality: Right;  DEPUY  . TOTAL SHOULDER ARTHROPLASTY Right 06/26/2013   Procedure: TOTAL SHOULDER ARTHROPLASTY;  Surgeon: Johnny Bridge, MD;  Location: [place];  Service: Orthopedics;  Laterality: Right;    in for   Chief Complaint  Patient presents with  . Congestive Heart Failure     HPI  Kim Mullins  is a 85 y.o. female, with past medical history significant for hypertension, systolic congestive heart failure on Bumex with last echo done in 2019 and ejection fraction of 35-40% who was discharged from skilled Rehab facility yesterday and continued to to have swelling that was noticed by her daughter who just came to visit from [place].  Patient is on home oxygen with periods of hypoxemia noted in the emergency room . Work-up in the emergency room showed sodium 130, BNP 1237, white blood cell count of 2.7 and hemoglobin of 13.8. Chest x-ray showed small left pleural effusion and basilar atelectasis with cardiomegaly . Patient denies any history of fever, chills,  nausea vomiting or diarrhea.  Denies any history of cough.     Review of Systems    In addition to the HPI above,  No Fever-chills, No Headache, No changes with Vision or hearing, No problems swallowing food or Liquids, No Chest pain, Cough  No Abdominal pain, No Nausea or Vommitting, Bowel movements are regular, No Blood in stool or Urine, No dysuria, No new skin rashes or bruises, No new joints pains-aches,  No new weakness, tingling, numbness in any extremity, No recent weight gain or loss, No polyuria, polydypsia or polyphagia, No significant Mental Stressors.  A full 10 point Review of Systems was done, except as stated above, all other Review of Systems were negative.   Social History Social History   Tobacco Use  . Smoking status: Former Smoker    Packs/day: 1.00    Years: 45.00    Pack years: 45.00    Types: Cigarettes    Quit date: 07/24/1978    Years since quitting: 40.9  . Smokeless tobacco: Never Used  Substance Use Topics  . Alcohol use: Yes    Alcohol/week: 7.0 standard drinks    Types: 7 Glasses of wine per week    Comment: every day     Family History Family History  Problem Relation Age of Onset  . Dementia Mother   . Coronary artery disease Mother   . Cancer Father        blood ? type  . Diabetes Maternal Grandfather   . Anuerysm Daughter        brain  . Colon cancer Neg Hx      Prior to Admission medications   Medication Sig Start Date End Date Taking? Authorizing Provider  Acetaminophen (TYLENOL) 325 MG CAPS Take 650 mg by mouth daily as needed (for shoulder pain).     [provider]  apixaban (ELIQUIS) 2.5 MG TABS tablet Take 1 tablet (2.5 mg total) by mouth 2 (two) times daily. 06/08/19   Gerlene Fee, NP  bumetanide (BUMEX) 2 MG tablet Take 2 tablets (4 mg total) by mouth 2 (two) times daily. 06/08/19   Gerlene Fee, NP  diclofenac sodium (VOLTAREN) 1 % GEL Apply 2 g topically 3 (three) times daily as needed (pain,  inflammation). 06/08/19   Gerlene Fee, NP  fexofenadine (ALLEGRA) 180 MG tablet Take 1 tablet (180 mg total) by mouth daily. 06/08/19   Gerlene Fee, NP  glucose blood test strip Use to  test blood sugar twice daily ([employer] [facility]) 07/19/16   Larey Dresser, MD  hydrocortisone cream 1 % Apply topically daily as needed for itching. Cream; amt: sparingly; topical Special Instructions: apply to itchy skin Once A Day - PRN PRN 1 Patient not taking: Reported on 06/11/2019 06/08/19   Gerlene Fee, NP  latanoprost (XALATAN) 0.005 % ophthalmic solution Place 1 drop into both eyes at bedtime. 06/08/19   Gerlene Fee, NP  NON FORMULARY Diet: Dysphagia 3 diet    [provider]  OXYGEN Inhale 2 L into the lungs continuous.     [provider]  pantoprazole (PROTONIX) 40 MG tablet Take 1 tablet (40 mg total) by mouth daily. Patient not taking: Reported on 06/11/2019 06/08/19   Gerlene Fee, NP  polyethylene glycol (MIRALAX / GLYCOLAX) 17 g packet Take 17 g by mouth daily. Patient not taking: Reported on 06/11/2019 05/26/19   Shelly Coss, MD  senna (SENOKOT) 8.6 MG TABS tablet Take 1 tablet (8.6 mg total) by mouth at bedtime. Patient not taking: Reported on 06/11/2019 05/25/19   Shelly Coss, MD  timolol (TIMOPTIC) 0.5 % ophthalmic solution Place 1 drop into both eyes at bedtime. 06/08/19   Gerlene Fee, NP    Allergies  Allergen Reactions  . Hydrocodone Other (See Comments)    Took away all energy and made her stop eating food for short time  . Percocet [Oxycodone-Acetaminophen] Other (See Comments)    Too away all energy and made her stop eating food for short time  . Eggs Or Egg-Derived Products Hives and Rash  . Mercurial Derivatives Itching and Rash  . Penicillins Hives and Rash    Has patient had a PCN reaction causing immediate rash, facial/tongue/throat swelling, SOB or lightheadedness with hypotension: Yes Has patient had a PCN reaction causing  severe rash involving mucus membranes or skin necrosis: No Has patient had a PCN reaction that required hospitalization: No Has patient had a PCN reaction occurring within the last 10 years: Yes If all of the above answers are "NO", then may proceed with Cephalosporin use.  . Quinine Derivatives Hives and Rash    Physical Exam  Vitals  Blood pressure (!) 103/58, pulse 79, temperature 97.6 F (36.4 C), temperature source Oral, resp. rate 16, SpO2 100 %.   General appearance, elderly female looks tired HEENT no jaundice or pallor, no facial deviation, no oral thrush Neck supple, + neck vein distention Chest decreased breath sounds at the bases with mild basilar crackles otherwise normal Heart normal S1-S2, no murmurs gallops or rubs Abdomen soft nontender bowel sounds present Extremities no clubbing or cyanosis, +1 edema lower extremities  Data Review  CBC Recent Labs  Lab 06/11/19 1327  WBC 2.7*  HGB 13.8  HCT 43.0  PLT 147*  MCV 91.1  MCH 29.2  MCHC 32.1  RDW 18.1*   ------------------------------------------------------------------------------------------------------------------  Chemistries  Recent Labs  Lab 06/05/19 0715 06/11/19 1327  NA 130* 130*  K 4.7 4.1  CL 94* 95*  CO2 23 22  GLUCOSE 82 126*  BUN 38* 32*  CREATININE 1.70* 1.92*  CALCIUM 9.0 8.9   ------------------------------------------------------------------------------------------------------------------ estimated creatinine clearance is 18.9 mL/min (A) (by C-G formula based on SCr of 1.92 mg/dL (H)). ------------------------------------------------------------------------------------------------------------------ No results for input(s): TSH, T4TOTAL, T3FREE, THYROIDAB in the last 72 hours.  Invalid input(s): FREET3   Coagulation profile No results for input(s): INR, PROTIME in the last 168  hours. ------------------------------------------------------------------------------------------------------------------- No results for input(s): DDIMER in the  last 72 hours. -------------------------------------------------------------------------------------------------------------------  Cardiac Enzymes No results for input(s): CKMB, TROPONINI, MYOGLOBIN in the last 168 hours.  Invalid input(s): CK ------------------------------------------------------------------------------------------------------------------ Invalid input(s): POCBNP   ---------------------------------------------------------------------------------------------------------------  Urinalysis    Component Value Date/Time   COLORURINE YELLOW 05/14/2019 2105   APPEARANCEUR CLEAR 05/14/2019 2105   LABSPEC 1.010 05/14/2019 2105   PHURINE 8.0 05/14/2019 2105   GLUCOSEU NEGATIVE 05/14/2019 2105   GLUCOSEU NEGATIVE 07/10/2014 1517   HGBUR NEGATIVE 05/14/2019 2105   BILIRUBINUR NEGATIVE 05/14/2019 2105   KETONESUR NEGATIVE 05/14/2019 2105   PROTEINUR NEGATIVE 05/14/2019 2105   UROBILINOGEN 0.2 10/06/2014 1552   NITRITE NEGATIVE 05/14/2019 2105   LEUKOCYTESUR SMALL (A) 05/14/2019 2105    ----------------------------------------------------------------------------------------------------------------    Imaging results:   Ct Abdomen Pelvis Wo Contrast  Result Date: 05/14/2019 CLINICAL DATA:  Nausea and vomiting EXAM: CT ABDOMEN AND PELVIS WITHOUT CONTRAST TECHNIQUE: Multidetector CT imaging of the abdomen and pelvis was performed following the standard protocol without IV contrast. COMPARISON:  02/09/2014 FINDINGS: Lower chest: No focal infiltrate or sizable effusion is seen. Heart is enlarged in size. Pacing device is noted. Hepatobiliary: No focal liver abnormality is seen. No gallstones, gallbladder wall thickening, or biliary dilatation. Pancreas: Unremarkable. No pancreatic ductal dilatation or surrounding  inflammatory changes. Spleen: Normal in size without focal abnormality. Adrenals/Urinary Tract: Adrenal glands are within normal limits. Large upper pole renal cyst is noted on the right stable from the prior exam. No obstructive changes are seen. Atrophy of the lower pole is noted new from the prior exam likely related to prior infarct. Right kidney demonstrates no renal calculi or obstructive changes. The bladder is well distended. Stomach/Bowel: Scattered diverticular changes noted without evidence of diverticulitis. The appendix is not well visualized. No inflammatory changes to suggest appendicitis are noted. No gastric or small bowel abnormality is noted. Vascular/Lymphatic: Aortic atherosclerosis. No enlarged abdominal or pelvic lymph nodes. Reproductive: Status post hysterectomy. No adnexal masses. Other: No hernia is identified.  Mild changes of anasarca are noted. Musculoskeletal: Right hip replacement is noted. Degenerative changes of lumbar spine are seen. IMPRESSION: Lower pole renal atrophy on the left consistent with prior infarct. Mild changes of anasarca. Diverticulosis without diverticulitis. Electronically Signed   By: Inez Catalina M.D.   On: 05/14/2019 22:56   Dg Chest 2 View  Result Date: 06/11/2019 CLINICAL DATA:  History of cardiac disease.  Fluid retention. EXAM: CHEST - 2 VIEW COMPARISON:  Single-view of the chest 05/14/2019. FINDINGS: There is cardiomegaly without edema. Small left pleural effusion and basilar atelectasis. Pacing device in place. Atherosclerosis noted. No acute or focal bony abnormality. IMPRESSION: Small left pleural effusion and basilar atelectasis. Cardiomegaly without edema. Atherosclerosis. Electronically Signed   By: Inge Rise M.D.   On: 06/11/2019 13:47   Ct Head Wo Contrast  Result Date: 05/14/2019 CLINICAL DATA:  Headache emesis EXAM: CT HEAD WITHOUT CONTRAST TECHNIQUE: Contiguous axial images were obtained from the base of the skull through the  vertex without intravenous contrast. COMPARISON:  CT brain 01/07/2017 FINDINGS: Brain: No acute territorial infarction, hemorrhage or intracranial mass. Atrophy and mild small vessel ischemic changes of the white matter. Probable chronic lacunar infarcts in the basal ganglia. Stable ventricle size Vascular: No hyperdense vessels.  Carotid vascular calcification Skull: Normal. Negative for fracture or focal lesion. Sinuses/Orbits: Mucosal thickening in the ethmoid sinuses Other: None IMPRESSION: 1. No CT evidence for acute intracranial abnormality. 2. Atrophy and small vessel ischemic changes of the white matter Electronically Signed   By: Maudie Mercury  Francoise Ceo M.D.   On: 05/14/2019 22:51   Dg Chest Portable 1 View  Result Date: 05/14/2019 CLINICAL DATA:  Shortness of breath EXAM: PORTABLE CHEST 1 VIEW COMPARISON:  08/17/2018 FINDINGS: Left-sided multi lead pacing device. Cardiomegaly with aortic atherosclerosis. No consolidation or effusion. No focal airspace disease. IMPRESSION: No active disease.  Stable cardiomegaly. Electronically Signed   By: Donavan Foil M.D.   On: 05/14/2019 17:00    My personal review of EKG: Paced rhythm at 77 bpm with no acute changes  Assessment & Plan  Acute on chronic systolic congestive heart failure Switch Bumex to Lasix IV for now Accurate intake output Weigh patient daily  Acute on chronic renal failure Monitor creatinine while diuresing patient  A. fib, chronic, rate controlled Continue with Eliquis  Allergies Continue with Allegra        DVT Prophylaxis Eliquis  AM Labs Ordered, also please review Full Orders  Family Communication: Discussed with daughter at bedside   code Status full  Disposition Plan: To be determined  Time spent in minutes : 44 minutes  Condition GUARDED   @SIGNATURE @

## 2019-06-11 NOTE — ED Triage Notes (Signed)
Pt arrives via EMS from home with 20lb weight gain over the last 3 weeks. Hasnt been taking medications accordingly. Denies CP, SOB. EKG paced rhythm, underlying AFIB. Last bp 116/86, hr 60, rr 18, 100% 2L, wears the same at baseline. CBG 191. 20g RAC.

## 2019-06-11 NOTE — ED Provider Notes (Signed)
[facility] EMERGENCY DEPARTMENT Provider Note   CSN: [id number] Arrival date & time: 06/11/19  1250     History   Chief Complaint Chief Complaint  Patient presents with  . Congestive Heart Failure    HPI Kim Mullins is a 85 y.o. female Afib (Eliquis), CHF (30-35%), Bowel obstruction, who presents for evaluation of worsening bilateral lower extremity edema, weight gain.  Patient was recently admitted to the hospital discharged on 05/25/2019.  She was discharged into a nursing rehab facility which she just left yesterday.  Granddaughter reports that once they got home, they noticed worsening swelling of her bilateral lower extremities.  They state that also she had some labored breathing.  She is on 2 L O2.  They did not have to increase it at home.  Patient states she does not have any pain and denies any chest pain, abdominal pain, nausea/vomiting.  Daughter did feel like her abdomen was slightly more swollen than normal.  Patient states she has been taking her medications.  She denies any fevers, cough.      The history is provided by the patient.    Past Medical History:  Diagnosis Date  . Anemia    occassionally  . Arthritis    all over.  . Asthma    Allergixc reaction to cats.  . Atrial fibrillation ([place])   . Bowel obstruction ([place])   . Chronic systolic CHF (congestive heart failure) (HCC)    EF 30-35% by cath, echo 2016 EF 50%  . Complete heart block (HCC)    a. s/p MDT CRTP pacemaker  . DCM (dilated cardiomyopathy) ([place]) 08/16/2014   normal coronary arteries on cath with EF 30-45%.  EF now 50% by echo 11/2014  . Diabetes mellitus 2006   Diet and exercise controlled.  Marland Kitchen Dyspnea   . GERD (gastroesophageal reflux disease)    occ  . Glaucoma 08/17/2018  . Hypertension   . Left tibial fracture 2007  . OSA (obstructive sleep apnea) 10/23/2014   Moderate with AHI 21/hr  . Osteoarthritis of right shoulder region 06/26/2013  . Stroke [facility])      Patient Active Problem List   Diagnosis Date Noted  . Congestive heart failure (CHF) ([place]) 06/11/2019  . Chronic respiratory failure with hypoxia ([place]) 05/29/2019  . Chronic constipation 05/29/2019  . Chronic non-seasonal allergic rhinitis 05/29/2019  . 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 FTT (failure to thrive) in adult 05/15/2019  . Hematemesis 05/14/2019  . Chronic systolic CHF (congestive heart failure) ([place]) 05/14/2019  . Acute renal failure superimposed on stage 3 chronic kidney disease ([place]) 05/14/2019  . Fall 05/14/2019  . Hyponatremia 05/14/2019  . Hyperkalemia 05/14/2019  . Obesity (BMI 30-39.9) 10/20/2018  . Acute on chronic heart failure ([place]) 08/19/2018  . Palliative care encounter   . Palliative care by specialist   . Acute respiratory failure with hypoxia ([place]) 08/18/2018  . Diabetes ([place]) 08/18/2018  . CKD (chronic kidney disease) 08/18/2018  . Amyloidosis ([place]) 08/18/2018  . Acute on chronic combined systolic and diastolic heart failure ([place]) 08/17/2018  . GERD (gastroesophageal reflux disease) 08/17/2018  . Elevated troponin 08/17/2018  . Glaucoma 08/17/2018  . Hyperbilirubinemia 08/17/2018  . CHF (congestive heart failure) ([place]) 07/18/2018  . Gait abnormality 05/03/2017  . Hypotension 04/14/2016  . Hypotension due to drugs   . Chronic anticoagulation   . TIA (transient ischemic attack) 04/12/2016  . UTI (lower urinary tract infection) 04/12/2016  . AKI (acute kidney injury) ([place]) 04/12/2016  .  Atrial fibrillation ([place]) 09/12/2015  . Occipital infarction ([place])   . History of stroke 06/18/2015  . Homonymous hemianopsia   . Pacemaker 01/21/2015  . OSA (obstructive sleep apnea) 10/23/2014  . Dizziness 10/21/2014  . Complete heart block ([place]) 10/06/2014  . Benign essential HTN 10/04/2014  . Acute on chronic systolic CHF (congestive heart failure) ([place]) 10/04/2014  . Abnormal cardiovascular stress test 08/27/2014  . Pulmonary HTN ([place]) 08/16/2014  . DCM  (dilated cardiomyopathy) ([place]) 08/16/2014  . Cough 07/13/2014  . Esophageal stricture 06/11/2014  . Nonspecific (abnormal) findings on radiological and other examination of gastrointestinal tract 05/29/2014  . Dyspnea 04/29/2014  . Osteoarthritis of right shoulder region 06/26/2013  . DJD (degenerative joint disease) of hip 11/16/2011    Class: Present on Admission    Past Surgical History:  Procedure Laterality Date  . ABDOMINAL HYSTERECTOMY  1969  . BI-VENTRICULAR PACEMAKER INSERTION N/A 10/07/2014   MDT CRTP implanted by Dr Lovena Le  . BRAVO [place] STUDY N/A 06/11/2014   Procedure: BRAVO [place]man STUDY;  Surgeon: Inda Castle, MD;  Location: [facility];  Service: Endoscopy;  Laterality: N/A;  . BUNIONECTOMY  1984   Bilateral  . CARDIAC CATHETERIZATION     normal coronary arteries  . Carpal [place]  2004   Bilateral  . CATARACT EXTRACTION Right    early 2015  . corn removal  1999   Bilateral feet  . DILATION AND CURETTAGE OF UTERUS  1968  . ESOPHAGOGASTRODUODENOSCOPY (EGD) WITH PROPOFOL N/A 06/11/2014   Procedure: ESOPHAGOGASTRODUODENOSCOPY (EGD) WITH PROPOFOL;  Surgeon: Inda Castle, MD;  Location: [facility];  Service: Endoscopy;  Laterality: N/A;  . [place]   Surgery to fix Retainal detachment, bilateral  . JOINT REPLACEMENT    . LEFT AND RIGHT HEART CATHETERIZATION WITH CORONARY ANGIOGRAM N/A 08/29/2014   Procedure: LEFT AND RIGHT HEART CATHETERIZATION WITH CORONARY ANGIOGRAM;  Surgeon: Peter M Martinique, MD;  Location[facility] CATH LAB;  Service: Cardiovascular;  Laterality: N/A;  . MALONEY DILATION  06/11/2014   Procedure: Venia Minks DILATION;  Surgeon: Inda Castle, MD;  Location: [facility] ENDOSCOPY;  Service: Endoscopy;;  . PILONIDAL CYST EXCISION  1959  . RIGHT HEART CATH N/A 07/24/2018   Procedure: RIGHT HEART CATH;  Surgeon: Larey Dresser, MD;  Location: [facility] CV LAB;  Service: Cardiovascular;  Laterality: N/A;  . TONSILLECTOMY  1942  . TOTAL HIP ARTHROPLASTY   11/16/2011   Procedure: TOTAL HIP ARTHROPLASTY ANTERIOR APPROACH;  Surgeon: Hessie Dibble, MD;  Location: [facility];  Service: Orthopedics;  Laterality: Right;  DEPUY  . TOTAL SHOULDER ARTHROPLASTY Right 06/26/2013   Procedure: TOTAL SHOULDER ARTHROPLASTY;  Surgeon: Johnny Bridge, MD;  Location: [facility];  Service: Orthopedics;  Laterality: Right;     OB History   No obstetric history on file.      Home Medications    Prior to Admission medications   Medication Sig Start Date End Date Taking? Authorizing Provider  Acetaminophen (TYLENOL) 325 MG CAPS Take 650 mg by mouth daily as needed (for shoulder pain).     [provider]  apixaban (ELIQUIS) 2.5 MG TABS tablet Take 1 tablet (2.5 mg total) by mouth 2 (two) times daily. 06/08/19   Gerlene Fee, NP  bumetanide (BUMEX) 2 MG tablet Take 2 tablets (4 mg total) by mouth 2 (two) times daily. 06/08/19   Gerlene Fee, NP  diclofenac sodium (VOLTAREN) 1 % GEL Apply 2 g topically 3 (three) times daily as  needed (pain, inflammation). 06/08/19   Gerlene Fee, NP  fexofenadine (ALLEGRA) 180 MG tablet Take 1 tablet (180 mg total) by mouth daily. 06/08/19   Gerlene Fee, NP  glucose blood test strip Use to test blood sugar twice daily (Accuchek Smartview) 07/19/16   Larey Dresser, MD  hydrocortisone cream 1 % Apply topically daily as needed for itching. Cream; amt: sparingly; topical Special Instructions: apply to itchy skin Once A Day - PRN PRN 1 Patient not taking: Reported on 06/11/2019 06/08/19   Gerlene Fee, NP  latanoprost (XALATAN) 0.005 % ophthalmic solution Place 1 drop into both eyes at bedtime. 06/08/19   Gerlene Fee, NP  NON FORMULARY Diet: Dysphagia 3 diet    [provider]  OXYGEN Inhale 2 L into the lungs continuous.     [provider]  pantoprazole (PROTONIX) 40 MG tablet Take 1 tablet (40 mg total) by mouth daily. Patient not taking: Reported on 06/11/2019 06/08/19   Gerlene Fee, NP   polyethylene glycol (MIRALAX / GLYCOLAX) 17 g packet Take 17 g by mouth daily. Patient not taking: Reported on 06/11/2019 05/26/19   Shelly Coss, MD  senna (SENOKOT) 8.6 MG TABS tablet Take 1 tablet (8.6 mg total) by mouth at bedtime. Patient not taking: Reported on 06/11/2019 05/25/19   Shelly Coss, MD  timolol (TIMOPTIC) 0.5 % ophthalmic solution Place 1 drop into both eyes at bedtime. 06/08/19   Gerlene Fee, NP    Family History Family History  Problem Relation Age of Onset  . Dementia Mother   . Coronary artery disease Mother   . Cancer Father        blood ? type  . Diabetes Maternal Grandfather   . Anuerysm Daughter        brain  . Colon cancer Neg Hx     Social History Social History   Tobacco Use  . Smoking status: Former Smoker    Packs/day: 1.00    Years: 45.00    Pack years: 45.00    Types: Cigarettes    Quit date: 07/24/1978    Years since quitting: 40.9  . Smokeless tobacco: Never Used  Substance Use Topics  . Alcohol use: Yes    Alcohol/week: 7.0 standard drinks    Types: 7 Glasses of wine per week    Comment: every day  . Drug use: No     Allergies   Hydrocodone, Percocet [oxycodone-acetaminophen], Eggs or egg-derived products, Mercurial derivatives, Penicillins, and Quinine derivatives   Review of Systems Review of Systems  Constitutional: Negative for fever.  Respiratory: Positive for shortness of breath. Negative for cough.   Cardiovascular: Positive for leg swelling. Negative for chest pain.  Gastrointestinal: Negative for abdominal pain, nausea and vomiting.  Genitourinary: Negative for dysuria and hematuria.  Neurological: Negative for headaches.  All other systems reviewed and are negative.    Physical Exam Updated Vital Signs BP (!) 103/58 (BP Location: Right Arm)   Pulse 79   Temp 97.6 F (36.4 C) (Oral)   Resp 16   SpO2 100%   Physical Exam Vitals signs and nursing note reviewed.  Constitutional:      Appearance:  She is ill-appearing.     Comments: Labored breathing noted.  HENT:     Head: Normocephalic and atraumatic.  Eyes:     General: Lids are normal.     Conjunctiva/sclera: Conjunctivae normal.     Pupils: Pupils are equal, round, and reactive to light.  Neck:     Musculoskeletal: Full passive range of motion without pain.  Cardiovascular:     Rate and Rhythm: Normal rate and regular rhythm.     Pulses:          Radial pulses are 2+ on the right side and 2+ on the left side.       Dorsalis pedis pulses are detected w/ Doppler on the right side and detected w/ Doppler on the left side.     Heart sounds: Normal heart sounds. No murmur. No friction rub. No gallop.   Pulmonary:     Effort: Pulmonary effort is normal. Tachypnea present.     Breath sounds: Rales present.     Comments: Increased work of breathing.  Diffuse rales noted bilaterally from the mid lung fields down.  Speaking in short sentences. Abdominal:     Palpations: Abdomen is soft. Abdomen is not rigid.     Tenderness: There is no abdominal tenderness. There is no guarding.     Comments: Abdomen soft, nondistended.  No tenderness palpation.  Musculoskeletal: Normal range of motion.     Comments: Lateral lower extremities with 2+ pitting edema noted to the knees that extends distally.  No overlying warmth, erythema.  Skin:    General: Skin is warm and dry.     Capillary Refill: Capillary refill takes less than 2 seconds.  Neurological:     Mental Status: She is alert and oriented to person, place, and time.  Psychiatric:        Speech: Speech normal.      ED Treatments / Results  Labs (all labs ordered are listed, but only abnormal results are displayed) Labs Reviewed  BASIC METABOLIC PANEL - Abnormal; Notable for the following components:      Result Value   Sodium 130 (*)    Chloride 95 (*)    Glucose, Bld 126 (*)    BUN 32 (*)    Creatinine, Ser 1.92 (*)    GFR calc non Af Amer 23 (*)    GFR calc Af Amer 27  (*)    All other components within normal limits  CBC - Abnormal; Notable for the following components:   WBC 2.7 (*)    RDW 18.1 (*)    Platelets 147 (*)    nRBC 0.7 (*)    All other components within normal limits  BRAIN NATRIURETIC PEPTIDE - Abnormal; Notable for the following components:   B Natriuretic Peptide 1,237.5 (*)    All other components within normal limits  TROPONIN I (HIGH SENSITIVITY) - Abnormal; Notable for the following components:   Troponin I (High Sensitivity) 203 (*)    All other components within normal limits  SARS CORONAVIRUS 2 (TAT 6-24 HRS)  TROPONIN I (HIGH SENSITIVITY)    EKG None  Radiology Dg Chest 2 View  Result Date: 06/11/2019 CLINICAL DATA:  History of cardiac disease.  Fluid retention. EXAM: CHEST - 2 VIEW COMPARISON:  Single-view of the chest 05/14/2019. FINDINGS: There is cardiomegaly without edema. Small left pleural effusion and basilar atelectasis. Pacing device in place. Atherosclerosis noted. No acute or focal bony abnormality. IMPRESSION: Small left pleural effusion and basilar atelectasis. Cardiomegaly without edema. Atherosclerosis. Electronically Signed   By: Inge Rise M.D.   On: 06/11/2019 13:47    Procedures Procedures (including critical care time)  Medications Ordered in ED Medications  sodium chloride flush (NS) 0.9 % injection 3 mL (3 mLs Intravenous Given 06/11/19 1553)  furosemide (LASIX) injection  40 mg (40 mg Intravenous Given 06/11/19 1620)     Initial Impression / Assessment and Plan / ED Course  I have reviewed the triage vital signs and the nursing notes.  Pertinent labs & imaging results that were available during my care of the patient were reviewed by me and considered in my medical decision making (see chart for details).  Clinical Course as of Jun 10 1641  Mon Jun 11, 2019  1541 B Natriuretic Peptide(!): 1,237.5 [HT]    Clinical Course User Index [HT] Delynn Flavin        85 year old female who presents for evaluation of increased weight gain, difficulty breathing.  Recently discharged from nursing rehab facility.  Presents with increased work of breathing, 20 pound weight gain over the last few weeks.  Patient is on 2 L O2 at baseline.  On initial arrival, she is afebrile.  Patient maintains to-3 L at home.  On our initial ED arrival, she would drop down to between 77-86% on 3 L.  She was increased to 4 with some improvement.  She has 2+ pitting edema noted to bilateral lower extremities.  Concern for CHF exacerbation.  BNP is elevated at 1237.5.  Troponin elevated at 203.  Suspect this most likely related to demand ischemia.  BMP shows potassium of 4.1, BUN of 32, creatinine 1.92.  CBC shows leukopenia of 2.7.  Hemoglobin stable.  X-ray shows cardiomegaly without edema.   Given patient's age, comorbidities, increased oxygen requirement, feel that patient needs admission for diuresis.  Will consult for admission.  Discussed patient with Dr. Laren Everts (hospitalist). Will admit.   Portions of this note were generated with Lobbyist. Dictation errors may occur despite best attempts at proofreading.   Final Clinical Impressions(s) / ED Diagnoses   Final diagnoses:  Acute congestive heart failure, unspecified heart failure type [facility])    ED Discharge Orders    None       Volanda Napoleon, PA-C 06/11/19 1642    Elnora Morrison, MD 06/14/19 [phone]

## 2019-06-11 NOTE — Telephone Encounter (Signed)
Kim Mullins with paramedicine called to report pts weight is up 20+lbs. Swelling in face, legs, and abdomen.  Pt "very" short of breath.  Pt advised to go to the emergency room.

## 2019-06-11 NOTE — Progress Notes (Signed)
Paramedicine Encounter    Patient ID: Kim Mullins, female    DOB: [birth year], 85 y.o.   MRN: [id number]   Patient Care Team: Lin Landsman, MD as PCP - General (Family Medicine) Larey Dresser, MD as PCP - Advanced Heart Failure (Cardiology) Jorge Ny, LCSW as Social Worker (Licensed Clinical Social Worker)  Patient Active Problem List   Diagnosis Date Noted  . Chronic respiratory failure with hypoxia ([place]) 05/29/2019  . Chronic constipation 05/29/2019  . Chronic non-seasonal allergic rhinitis 05/29/2019  . 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 FTT (failure to thrive) in adult 05/15/2019  . Hematemesis 05/14/2019  . Chronic systolic CHF (congestive heart failure) ([place]) 05/14/2019  . Acute renal failure superimposed on stage 3 chronic kidney disease ([place]) 05/14/2019  . Fall 05/14/2019  . Hyponatremia 05/14/2019  . Hyperkalemia 05/14/2019  . Obesity (BMI 30-39.9) 10/20/2018  . Acute on chronic heart failure ([place]) 08/19/2018  . Palliative care encounter   . Palliative care by specialist   . Acute respiratory failure with hypoxia ([place]) 08/18/2018  . Diabetes ([place]) 08/18/2018  . CKD (chronic kidney disease) 08/18/2018  . Amyloidosis ([place]) 08/18/2018  . Acute on chronic combined systolic and diastolic heart failure ([place]) 08/17/2018  . GERD (gastroesophageal reflux disease) 08/17/2018  . Elevated troponin 08/17/2018  . Glaucoma 08/17/2018  . Hyperbilirubinemia 08/17/2018  . CHF (congestive heart failure) ([place]) 07/18/2018  . Gait abnormality 05/03/2017  . Hypotension 04/14/2016  . Hypotension due to drugs   . Chronic anticoagulation   . TIA (transient ischemic attack) 04/12/2016  . UTI (lower urinary tract infection) 04/12/2016  . AKI (acute kidney injury) ([place]) 04/12/2016  . Atrial fibrillation ([place]) 09/12/2015  . Occipital infarction ([place])   . History of stroke 06/18/2015  . Homonymous hemianopsia   . Pacemaker 01/21/2015  . OSA (obstructive sleep apnea) 10/23/2014   . Dizziness 10/21/2014  . Complete heart block ([place]) 10/06/2014  . Benign essential HTN 10/04/2014  . Acute on chronic systolic CHF (congestive heart failure) ([place]) 10/04/2014  . Abnormal cardiovascular stress test 08/27/2014  . Pulmonary HTN ([place]) 08/16/2014  . DCM (dilated cardiomyopathy) ([place]) 08/16/2014  . Cough 07/13/2014  . Esophageal stricture 06/11/2014  . Nonspecific (abnormal) findings on radiological and other examination of gastrointestinal tract 05/29/2014  . Dyspnea 04/29/2014  . Osteoarthritis of right shoulder region 06/26/2013  . DJD (degenerative joint disease) of hip 11/16/2011    Class: Present on Admission    Current Outpatient Medications:  .  Acetaminophen (TYLENOL) 325 MG CAPS, Take 650 mg by mouth daily as needed (for shoulder pain). , Disp: , Rfl:  .  apixaban (ELIQUIS) 2.5 MG TABS tablet, Take 1 tablet (2.5 mg total) by mouth 2 (two) times daily., Disp: 60 tablet, Rfl: 0 .  bumetanide (BUMEX) 2 MG tablet, Take 2 tablets (4 mg total) by mouth 2 (two) times daily., Disp: 120 tablet, Rfl: 0 .  diclofenac sodium (VOLTAREN) 1 % GEL, Apply 2 g topically 3 (three) times daily as needed (pain, inflammation)., Disp: 50 g, Rfl: 0 .  fexofenadine (ALLEGRA) 180 MG tablet, Take 1 tablet (180 mg total) by mouth daily., Disp: 30 tablet, Rfl: 0 .  glucose blood test strip, Use to test blood sugar twice daily (Accuchek Smartview) (Patient not taking: Reported on 06/06/2019), Disp: 100 each, Rfl: 0 .  hydrocortisone cream 1 %, Apply topically daily as needed for itching. Cream; amt: sparingly; topical Special Instructions: apply to itchy skin Once A Day - PRN PRN 1, Disp:  30 g, Rfl: 0 .  latanoprost (XALATAN) 0.005 % ophthalmic solution, Place 1 drop into both eyes at bedtime., Disp: 2.5 mL, Rfl: 0 .  NON FORMULARY, Diet: Dysphagia 3 diet, Disp: , Rfl:  .  OXYGEN, Inhale 2 L into the lungs continuous. , Disp: , Rfl:  .  pantoprazole (PROTONIX) 40 MG tablet, Take 1 tablet (40 mg  total) by mouth daily., Disp: 30 tablet, Rfl: 0 .  polyethylene glycol (MIRALAX / GLYCOLAX) 17 g packet, Take 17 g by mouth daily., Disp: 14 each, Rfl: 0 .  senna (SENOKOT) 8.6 MG TABS tablet, Take 1 tablet (8.6 mg total) by mouth at bedtime., Disp: 120 tablet, Rfl: 0 .  timolol (TIMOPTIC) 0.5 % ophthalmic solution, Place 1 drop into both eyes at bedtime., Disp: 10 mL, Rfl: 0 Allergies  Allergen Reactions  . Hydrocodone Other (See Comments)    Took away all energy and made her stop eating food for short time  . Percocet [Oxycodone-Acetaminophen] Other (See Comments)    Too away all energy and made her stop eating food for short time  . Eggs Or Egg-Derived Products Hives and Rash  . Mercurial Derivatives Itching and Rash  . Penicillins Hives and Rash    Has patient had a PCN reaction causing immediate rash, facial/tongue/throat swelling, SOB or lightheadedness with hypotension: Yes Has patient had a PCN reaction causing severe rash involving mucus membranes or skin necrosis: No Has patient had a PCN reaction that required hospitalization: No Has patient had a PCN reaction occurring within the last 10 years: Yes If all of the above answers are "NO", then may proceed with Cephalosporin use.  . Quinine Derivatives Hives and Rash     Social History   Socioeconomic History  . Marital status: Widowed    Spouse name: Not on file  . Number of children: 0  . Years of education: Not on file  . Highest education level: Doctorate  Occupational History  . Occupation: retired  Scientific laboratory technician  . Financial resource strain: Not hard at all  . Food insecurity    Worry: Never true    Inability: Never true  . Transportation needs    Medical: No    Non-medical: No  Tobacco Use  . Smoking status: Former Smoker    Packs/day: 1.00    Years: 45.00    Pack years: 45.00    Types: Cigarettes    Quit date: 07/24/1978    Years since quitting: 40.9  . Smokeless tobacco: Never Used  Substance and  Sexual Activity  . Alcohol use: Yes    Alcohol/week: 7.0 standard drinks    Types: 7 Glasses of wine per week    Comment: every day  . Drug use: No  . Sexual activity: Not Currently  Lifestyle  . Physical activity    Days per week: Not on file    Minutes per session: Not on file  . Stress: Not on file  Relationships  . Social Herbalist on phone: Not on file    Gets together: Not on file    Attends religious service: Not on file    Active member of club or organization: Not on file    Attends meetings of clubs or organizations: Not on file    Relationship status: Not on file  . Intimate partner violence    Fear of current or ex partner: Not on file    Emotionally abused: Not on file    Physically abused:  Not on file    Forced sexual activity: Not on file  Other Topics Concern  . Not on file  Social History Narrative   Lives alone    Physical Exam Vitals signs reviewed.  Constitutional:      Comments: WEIGHT GAIN IN FACE, LEGS AND ABDOMEN   HENT:     Head: Normocephalic.     Nose: Nose normal.     Mouth/Throat:     Mouth: Mucous membranes are moist.  Eyes:     Pupils: Pupils are equal, round, and reactive to light.  Neck:     Musculoskeletal: Normal range of motion and neck supple.  Cardiovascular:     Rate and Rhythm: Bradycardia present.     Pulses: Normal pulses.  Pulmonary:     Effort: Pulmonary effort is normal.  Abdominal:     General: There is distension.     Palpations: Abdomen is soft.  Musculoskeletal: Normal range of motion.     Right lower leg: Edema present.     Left lower leg: Edema present.  Skin:    General: Skin is warm.     Capillary Refill: Capillary refill takes less than 2 seconds.  Neurological:     Mental Status: She is alert. Mental status is at baseline.  Psychiatric:        Mood and Affect: Mood normal.    Arrived for home visit with [name] who was seated in her walker with swelling to the face, legs and abdomen.  Kim Mullins appeared short of breath despite being on her oxygen. Kim Mullins stated she did not feel short of breath. Lung sounds were clear aside from lower right lobe sounding diminished. Kim Mullins stated she had been getting her medications in the clinic but unsure if they were correct. Kim Mullins also stated she has been eating a lot of soup while in the assisted living facility. I contacted [facility] who advised patient needed to be transported to [facility] for further evaluation. EMS was contacted for transport. IV access obtained. Care transferred to Paramedic. Kim Mullins sent to ED for further. Will continue to follow throughout week. Will follow up next Monday.    Weight- 140lbs [id number]     Future Appointments  Date Time Provider [place]  06/14/2019 10:00 AM Billie Ruddy, MD [facility]  07/04/2019 11:15 AM Gardiner Barefoot, DPM [facility]     ACTION: Home visit completed

## 2019-06-12 DIAGNOSIS — I420 Dilated cardiomyopathy: Secondary | ICD-10-CM | POA: Diagnosis present

## 2019-06-12 DIAGNOSIS — I509 Heart failure, unspecified: Secondary | ICD-10-CM | POA: Diagnosis present

## 2019-06-12 DIAGNOSIS — E871 Hypo-osmolality and hyponatremia: Secondary | ICD-10-CM | POA: Diagnosis not present

## 2019-06-12 DIAGNOSIS — Z515 Encounter for palliative care: Secondary | ICD-10-CM | POA: Diagnosis not present

## 2019-06-12 DIAGNOSIS — H409 Unspecified glaucoma: Secondary | ICD-10-CM | POA: Diagnosis present

## 2019-06-12 DIAGNOSIS — I272 Pulmonary hypertension, unspecified: Secondary | ICD-10-CM | POA: Diagnosis present

## 2019-06-12 DIAGNOSIS — I471 Supraventricular tachycardia: Secondary | ICD-10-CM | POA: Diagnosis not present

## 2019-06-12 DIAGNOSIS — Z950 Presence of cardiac pacemaker: Secondary | ICD-10-CM | POA: Diagnosis not present

## 2019-06-12 DIAGNOSIS — E1122 Type 2 diabetes mellitus with diabetic chronic kidney disease: Secondary | ICD-10-CM | POA: Diagnosis present

## 2019-06-12 DIAGNOSIS — I130 Hypertensive heart and chronic kidney disease with heart failure and stage 1 through stage 4 chronic kidney disease, or unspecified chronic kidney disease: Secondary | ICD-10-CM | POA: Diagnosis not present

## 2019-06-12 DIAGNOSIS — J9611 Chronic respiratory failure with hypoxia: Secondary | ICD-10-CM | POA: Diagnosis not present

## 2019-06-12 DIAGNOSIS — Z8673 Personal history of transient ischemic attack (TIA), and cerebral infarction without residual deficits: Secondary | ICD-10-CM | POA: Diagnosis not present

## 2019-06-12 DIAGNOSIS — R531 Weakness: Secondary | ICD-10-CM | POA: Diagnosis not present

## 2019-06-12 DIAGNOSIS — I43 Cardiomyopathy in diseases classified elsewhere: Secondary | ICD-10-CM | POA: Diagnosis present

## 2019-06-12 DIAGNOSIS — N183 Chronic kidney disease, stage 3 unspecified: Secondary | ICD-10-CM | POA: Diagnosis not present

## 2019-06-12 DIAGNOSIS — Z8249 Family history of ischemic heart disease and other diseases of the circulatory system: Secondary | ICD-10-CM | POA: Diagnosis not present

## 2019-06-12 DIAGNOSIS — Z7189 Other specified counseling: Secondary | ICD-10-CM | POA: Diagnosis not present

## 2019-06-12 DIAGNOSIS — R0602 Shortness of breath: Secondary | ICD-10-CM | POA: Diagnosis not present

## 2019-06-12 DIAGNOSIS — Z96611 Presence of right artificial shoulder joint: Secondary | ICD-10-CM | POA: Diagnosis present

## 2019-06-12 DIAGNOSIS — Z7901 Long term (current) use of anticoagulants: Secondary | ICD-10-CM | POA: Diagnosis not present

## 2019-06-12 DIAGNOSIS — K219 Gastro-esophageal reflux disease without esophagitis: Secondary | ICD-10-CM | POA: Diagnosis present

## 2019-06-12 DIAGNOSIS — I5043 Acute on chronic combined systolic (congestive) and diastolic (congestive) heart failure: Secondary | ICD-10-CM | POA: Diagnosis not present

## 2019-06-12 DIAGNOSIS — E854 Organ-limited amyloidosis: Secondary | ICD-10-CM | POA: Diagnosis not present

## 2019-06-12 DIAGNOSIS — Z96641 Presence of right artificial hip joint: Secondary | ICD-10-CM | POA: Diagnosis present

## 2019-06-12 DIAGNOSIS — I5023 Acute on chronic systolic (congestive) heart failure: Secondary | ICD-10-CM | POA: Diagnosis not present

## 2019-06-12 DIAGNOSIS — I442 Atrioventricular block, complete: Secondary | ICD-10-CM | POA: Diagnosis not present

## 2019-06-12 DIAGNOSIS — Z20828 Contact with and (suspected) exposure to other viral communicable diseases: Secondary | ICD-10-CM | POA: Diagnosis not present

## 2019-06-12 DIAGNOSIS — N179 Acute kidney failure, unspecified: Secondary | ICD-10-CM | POA: Diagnosis not present

## 2019-06-12 DIAGNOSIS — I482 Chronic atrial fibrillation, unspecified: Secondary | ICD-10-CM | POA: Diagnosis not present

## 2019-06-12 DIAGNOSIS — G4733 Obstructive sleep apnea (adult) (pediatric): Secondary | ICD-10-CM | POA: Diagnosis present

## 2019-06-12 LAB — CBG MONITORING, ED: Glucose-Capillary: 169 mg/dL — ABNORMAL HIGH (ref 70–99)

## 2019-06-12 LAB — SARS CORONAVIRUS 2 (TAT 6-24 HRS): SARS Coronavirus 2: NEGATIVE

## 2019-06-12 NOTE — ED Notes (Signed)
Admitting at bedside 

## 2019-06-12 NOTE — ED Notes (Addendum)
ED TO INPATIENT HANDOFF REPORT  ED Nurse Name and Phone #: Achillies Buehl [phone]  S Name/Age/Gender Kim Mullins 85 y.o. female Room/Bed: 053C/053C  Code Status   Code Status: Full Code  Home/SNF/Other Home Patient oriented to: self & place Is this baseline? Yes   Triage Complete: Triage complete  Chief Complaint CHF AND WEIGHT GAIN  Triage Note Pt arrives via EMS from home with 20lb weight gain over the last 3 weeks. Hasnt been taking medications accordingly. Denies CP, SOB. EKG paced rhythm, underlying AFIB. Last bp 116/86, hr 60, rr 18, 100% 2L, wears the same at baseline. CBG 191. 20g RAC.    Allergies Allergies  Allergen Reactions  . Hydrocodone Other (See Comments)    Took away all energy and made her stop eating food for short time  . Percocet [Oxycodone-Acetaminophen] Other (See Comments)    Too away all energy and made her stop eating food for short time  . Eggs Or Egg-Derived Products Hives and Rash  . Mercurial Derivatives Itching and Rash  . Penicillins Hives and Rash    Has patient had a PCN reaction causing immediate rash, facial/tongue/throat swelling, SOB or lightheadedness with hypotension: Yes Has patient had a PCN reaction causing severe rash involving mucus membranes or skin necrosis: No Has patient had a PCN reaction that required hospitalization: No Has patient had a PCN reaction occurring within the last 10 years: Yes If all of the above answers are "NO", then may proceed with Cephalosporin use.  . Quinine Derivatives Hives and Rash    Level of Care/Admitting Diagnosis ED Disposition    ED Disposition Condition [facility]: [place] [[id number]]  Level of Care: Telemetry Cardiac [103]  Covid Evaluation: Confirmed COVID Negative  Diagnosis: Acute on chronic heart failure [facility]) [[id number]]  Admitting Physician: Maida Sale  Attending Physician: Geradine Girt [[id number]]  Estimated length of stay:  past midnight tomorrow  Certification:: I certify this patient will need inpatient services for at least 2 midnights  PT Class (Do Not Modify): Inpatient [101]  PT Acc Code (Do Not Modify): Private [1]       B Medical/Surgery History Past Medical History:  Diagnosis Date  . Anemia    occassionally  . Arthritis    all over.  . Asthma    Allergixc reaction to cats.  . Atrial fibrillation ([place])   . Bowel obstruction ([place])   . Chronic systolic CHF (congestive heart failure) (HCC)    EF 30-35% by cath, echo 2016 EF 50%  . Complete heart block (HCC)    a. s/p MDT CRTP pacemaker  . DCM (dilated cardiomyopathy) ([place]) 08/16/2014   normal coronary arteries on cath with EF 30-45%.  EF now 50% by echo 11/2014  . Diabetes mellitus 2006   Diet and exercise controlled.  Marland Kitchen Dyspnea   . GERD (gastroesophageal reflux disease)    occ  . Glaucoma 08/17/2018  . Hypertension   . Left tibial fracture 2007  . OSA (obstructive sleep apnea) 10/23/2014   Moderate with AHI 21/hr  . Osteoarthritis of right shoulder region 06/26/2013  . Stroke [facility])    Past Surgical History:  Procedure Laterality Date  . ABDOMINAL HYSTERECTOMY  1969  . BI-VENTRICULAR PACEMAKER INSERTION N/A 10/07/2014   MDT CRTP implanted by Dr Lovena Le  . BRAVO [place] STUDY N/A 06/11/2014   Procedure: BRAVO [place] STUDY;  Surgeon: Inda Castle, MD;  Location: [facility] ENDOSCOPY;  Service: Endoscopy;  Laterality: N/A;  . BUNIONECTOMY  1984   Bilateral  . CARDIAC CATHETERIZATION     normal coronary arteries  . Carpal [place]  2004   Bilateral  . CATARACT EXTRACTION Right    early 2015  . corn removal  1999   Bilateral feet  . DILATION AND CURETTAGE OF UTERUS  1968  . ESOPHAGOGASTRODUODENOSCOPY (EGD) WITH PROPOFOL N/A 06/11/2014   Procedure: ESOPHAGOGASTRODUODENOSCOPY (EGD) WITH PROPOFOL;  Surgeon: Inda Castle, MD;  Location: [facility] ENDOS[facility];  Service: Endoscopy;  Laterality: N/A;  . [place]   Surgery to fix Retainal detachment,  bilateral  . JOINT REPLACEMENT    . LEFT AND RIGHT HEART CATHETERIZATION WITH CORONARY ANGIOGRAM N/A 08/29/2014   Procedure: LEFT AND RIGHT HEART CATHETERIZATION WITH CORONARY ANGIOGRAM;  Surgeon: Peter M Martinique, MD;  Location: [facility] CATH LAB;  Service: Cardiovascular;  Laterality: N/A;  . MALONEY DILATION  06/11/2014   Procedure: Venia Minks DILATION;  Surgeon: Inda Castle, MD;  Location: [facility] ENDOSCOPY;  Service: Endoscopy;;  . PILONIDAL CYST EXCISION  1959  . RIGHT HEART CATH N/A 07/24/2018   Procedure: RIGHT HEART CATH;  Surgeon: Larey Dresser, MD;  Location: [place] [facility] CV LAB;  Service: Cardiovascular;  Laterality: N/A;  . TONSILLECTOMY  1942  . TOTAL HIP ARTHROPLASTY  11/16/2011   Procedure: TOTAL HIP ARTHROPLASTY ANTERIOR APPROACH;  Surgeon: Hessie Dibble, MD;  Location: [place];  Service: Orthopedics;  Laterality: Right;  DEPUY  . TOTAL SHOULDER ARTHROPLASTY Right 06/26/2013   Procedure: TOTAL SHOULDER ARTHROPLASTY;  Surgeon: Johnny Bridge, MD;  Location: [place];  Service: Orthopedics;  Laterality: Right;     A IV Location/Drains/Wounds Patient Lines/Drains/Airways Status   Active Line/Drains/Airways    None          Intake/Output Last 24 hours No intake or output data in the 24 hours ending 06/12/19 1303  Labs/Imaging Results for orders placed or performed during the hospital encounter of 06/11/19 (from the past 48 hour(s))  Basic metabolic panel     Status: Abnormal   Collection Time: 06/11/19  1:27 PM  Result Value Ref Range   Sodium 130 (L) 135 - 145 mmol/L   Potassium 4.1 3.5 - 5.1 mmol/L   Chloride 95 (L) 98 - 111 mmol/L   CO2 22 22 - 32 mmol/L   Glucose, Bld 126 (H) 70 - 99 mg/dL   BUN 32 (H) 8 - 23 mg/dL   Creatinine, Ser 1.92 (H) 0.44 - 1.00 mg/dL   Calcium 8.9 8.9 - 10.3 mg/dL   GFR calc non Af Amer 23 (L) >60 mL/min   GFR calc Af Amer 27 (L) >60 mL/min   Anion gap 13 5 - 15    Comment: Performed at [facility], [address], [place],  [place] [address]  CBC     Status: Abnormal   Collection Time: 06/11/19  1:27 PM  Result Value Ref Range   WBC 2.7 (L) 4.0 - 10.5 K/uL   RBC 4.72 3.87 - 5.11 MIL/uL   Hemoglobin 13.8 12.0 - 15.0 g/dL   HCT 43.0 36.0 - 46.0 %   MCV 91.1 80.0 - 100.0 fL   MCH 29.2 26.0 - 34.0 pg   MCHC 32.1 30.0 - 36.0 g/dL   RDW 18.1 (H) 11.5 - 15.5 %   Platelets 147 (L) 150 - 400 K/uL   nRBC 0.7 (H) 0.0 - 0.2 %    Comment: Performed at [facility]  [address], [place], [place] [address]  Troponin I (High Sensitivity)     Status: Abnormal   Collection Time: 06/11/19  1:27 PM  Result Value Ref Range   Troponin I (High Sensitivity) 203 (HH) <18 ng/L    Comment: CRITICAL RESULT CALLED TO, READ BACK BY AND VERIFIED WITH: B LEAK RN [phone] BY A BENNETT (NOTE) Elevated high sensitivity troponin I (hsTnI) values and significant  changes across serial measurements may suggest ACS but many other  chronic and acute conditions are known to elevate hsTnI results.  Refer to the Links section for chest pain algorithms and additional  guidance. Performed at [facility], [place] [address] [place], [place] [address]   Brain natriuretic peptide     Status: Abnormal   Collection Time: 06/11/19  1:27 PM  Result Value Ref Range   B Natriuretic Peptide 1,237.5 (H) 0.0 - 100.0 pg/mL    Comment: Performed at [facility] [address] [place], [place] [address]  Troponin I (High Sensitivity)     Status: Abnormal   Collection Time: 06/11/19  2:55 PM  Result Value Ref Range   Troponin I (High Sensitivity) 194 (HH) <18 ng/L    Comment: CRITICAL VALUE NOTED.  VALUE IS CONSISTENT WITH PREVIOUSLY REPORTED AND CALLED VALUE. (NOTE) Elevated high sensitivity troponin I (hsTnI) values and significant  changes across serial measurements may suggest ACS but many other  chronic and acute conditions are known to elevate hsTnI results.  Refer to the Links section for chest pain algorithms and additional   guidance. Performed at [facility], [place] [address] [place], [place] [address]   SARS CORONAVIRUS 2 (TAT 6-24 HRS) Nasopharyngeal Nasopharyngeal Swab     Status: None   Collection Time: 06/11/19  4:22 PM   Specimen: Nasopharyngeal Swab  Result Value Ref Range   SARS Coronavirus 2 NEGATIVE NEGATIVE    Comment: (NOTE) SARS-CoV-2 target nucleic acids are NOT DETECTED. The SARS-CoV-2 RNA is generally detectable in upper and lower respiratory specimens during the acute phase of infection. Negative results do not preclude SARS-CoV-2 infection, do not rule out co-infections with other pathogens, and should not be used as the sole basis for treatment or other patient management decisions. Negative results must be combined with clinical observations, patient history, and epidemiological information. The expected result is Negative. Fact Sheet for Patients: SugarRoll.be Fact Sheet for Healthcare Providers: https://www.woods-mathews.com/ This test is not yet approved or cleared by the [place] FDA and  has been authorized for detection and/or diagnosis of SARS-CoV-2 by FDA under an Emergency Use Authorization (EUA). This EUA will remain  in effect (meaning this test can be used) for the duration of the COVID-19 declaration under Section 56 4(b)(1) of the Act, 21 U.S.C. section 360bbb-3(b)(1), unless the authorization is terminated or revoked sooner. Performed at [facility], [place] [address] [place],  [address]   CBG monitoring, ED     Status: Abnormal   Collection Time: 06/12/19 11:27 AM  Result Value Ref Range   Glucose-Capillary 169 (H) 70 - 99 mg/dL   Dg Chest 2 View  Result Date: 06/11/2019 CLINICAL DATA:  History of cardiac disease.  Fluid retention. EXAM: CHEST - 2 VIEW COMPARISON:  Single-view of the chest 05/14/2019. FINDINGS: There is cardiomegaly without edema. Small left pleural effusion and basilar  atelectasis. Pacing device in place. Atherosclerosis noted. No acute or focal bony abnormality. IMPRESSION: Small left pleural effusion and basilar atelectasis. Cardiomegaly without edema.  Atherosclerosis. Electronically Signed   By: Inge Rise M.D.   On: 06/11/2019 13:47    Pending Labs Unresulted Labs (From admission, onward)   None      Vitals/Pain Today's Vitals   06/12/19 1045 06/12/19 1115 06/12/19 1200 06/12/19 1300  BP: (!) 119/96 112/85 107/80 (!) 105/58  Pulse: 60   63  Resp: 20 18 18 16   Temp:      TempSrc:      SpO2: 100%   96%  PainSc:        Isolation Precautions No active isolations  Medications Medications  acetaminophen (TYLENOL) tablet 650 mg (has no administration in time range)  pantoprazole (PROTONIX) EC tablet 40 mg (40 mg Oral Given 06/12/19 1054)  polyethylene glycol (MIRALAX / GLYCOLAX) packet 17 g (17 g Oral Given 06/12/19 1053)  senna (SENOKOT) tablet 8.6 mg (8.6 mg Oral Given 06/11/19 2218)  apixaban (ELIQUIS) tablet 2.5 mg (2.5 mg Oral Given 06/12/19 1054)  loratadine (CLARITIN) tablet 10 mg (10 mg Oral Given 06/12/19 1054)  ondansetron (ZOFRAN) tablet 4 mg (has no administration in time range)    Or  ondansetron (ZOFRAN) injection 4 mg (has no administration in time range)  sodium chloride flush (NS) 0.9 % injection 3 mL (3 mLs Intravenous Given 06/11/19 1553)  furosemide (LASIX) injection 40 mg (40 mg Intravenous Given 06/11/19 1620)    Mobility non-ambulatory Low fall risk   Focused Assessments Cardiac Assessment Handoff:    Lab Results  Component Value Date   CKTOTAL 43 05/15/2019   TROPONINI 0.25 ([place]) 08/18/2018   Lab Results  Component Value Date   DDIMER 1.94 (H) 08/17/2018   Does the Patient currently have chest pain? No     R Recommendations: See Admitting Provider Note  Report given to:   Additional Notes:   2L O2 [place] at baseline - has aide at home to help. Lives at home by herself. Uses wheelchair. Has a  pacemaker.

## 2019-06-12 NOTE — ED Notes (Signed)
Tele ordered bfast 

## 2019-06-12 NOTE — ED Notes (Signed)
Lunch trey ordered

## 2019-06-12 NOTE — Progress Notes (Signed)
[facility] PROGRESS NOTE  Kim Mullins WUJ:[id number] DOB: August 26, 1930 DOA: 06/11/2019 PCP: Lin Landsman, MD  Assessment/Plan:  Acute on chronic systolic congestive heart failure. Likely related to disease progression. Pt spent last 3 weeks in facility ensuring med compliance. Not sure if daily weights obtained. She was discharged and went home for 1/2 day. Family noted edema and sob. Chart review indicates weight currently 63. 5kg up from 51.4kg 05/17/19. Home med include bumex. This was changed to lasix IV on admission. Last hospitalization Palliative care met with patient and family. Plan was for Palliative to follow at snf and that if patient did not improve, granddaughter [name] planned to take her home with Hospice. I spoke with granddaughter today and plan for home with Hospice remains goal. Chest xray with Small left pleural effusion and basilar atelectasis. BNP 1237. -Continue Lasix IV for now -Accurate intake output -Weigh patient daily  Acute on chronic renal failure stage III. Baseline creatine appears to be 1.3.  -hold nephrotoxins as able -monitor urine output -Monitor creatinine while diuresing patient  Hyponatremia. Mild. Somewhat chronic. Sodium 130 on admission  A. fib, chronic, rate controlled. chadvasc score 7. Home meds include Eliquis.  -Continue with Eliquis  HTN. Fair control. Home meds include bumex,  -lasix as noted above -monitor BP  Chronic respiratory failure with hypoxia. Home oxygen. Chest xray with small left pleural effusion basilar atelectasis. Does no seem to have increased oxygen demand. Does not perceive self as sob.  -see above  Allergies Continue with Allegra  Code Status: full Family Communication: Museum/gallery exhibitions officer granddaughter Disposition Plan: home likely with hospice   Consultants:    Procedures:    Antibiotics:    HPI/Subjective: Lying in bed awake alert. Denies pain/discomfort or sob.    Objective: Vitals:   06/12/19 1200 06/12/19 1300  BP: 107/80 (!) 105/58  Pulse:  63  Resp: 18 16  Temp:    SpO2:  96%   No intake or output data in the 24 hours ending 06/12/19 1354 There were no vitals filed for this visit.  Exam:   General:  Thin frail no acute distress  Cardiovascular: rrr +murmur 1+pitting edema  Respiratory: mild increased work of breathing with conversation, fine crackles bilateral bases.   Abdomen: obese soft +BS no guarding or rebounding   Musculoskeletal: joints without swelling/erythema   Data Reviewed: Basic Metabolic Panel: Recent Labs  Lab 06/11/19 1327  NA 130*  K 4.1  CL 95*  CO2 22  GLUCOSE 126*  BUN 32*  CREATININE 1.92*  CALCIUM 8.9   Liver Function Tests: No results for input(s): AST, ALT, ALKPHOS, BILITOT, PROT, ALBUMIN in the last 168 hours. No results for input(s): LIPASE, AMYLASE in the last 168 hours. No results for input(s): AMMONIA in the last 168 hours. CBC: Recent Labs  Lab 06/11/19 1327  WBC 2.7*  HGB 13.8  HCT 43.0  MCV 91.1  PLT 147*   Cardiac Enzymes: No results for input(s): CKTOTAL, CKMB, CKMBINDEX, TROPONINI in the last 168 hours. BNP (last 3 results) Recent Labs    10/09/18 1238 05/15/19 0516 06/11/19 1327  BNP 1,424.7* 1,217.6* 1,237.5*    ProBNP (last 3 results) No results for input(s): PROBNP in the last 8760 hours.  CBG: Recent Labs  Lab 06/12/19 1127  GLUCAP 169*    Recent Results (from the past 240 hour(s))  SARS CORONAVIRUS 2 (TAT 6-24 HRS) Nasopharyngeal Nasopharyngeal Swab     Status: None   Collection Time: 06/11/19  4:22 PM  Specimen: Nasopharyngeal Swab  Result Value Ref Range Status   SARS Coronavirus 2 NEGATIVE NEGATIVE Final    Comment: (NOTE) SARS-CoV-2 target nucleic acids are NOT DETECTED. The SARS-CoV-2 RNA is generally detectable in upper and lower respiratory specimens during the acute phase of infection. Negative results do not preclude SARS-CoV-2  infection, do not rule out co-infections with other pathogens, and should not be used as the sole basis for treatment or other patient management decisions. Negative results must be combined with clinical observations, patient history, and epidemiological information. The expected result is Negative. Fact Sheet for Patients: SugarRoll.be Fact Sheet for Healthcare Providers: https://www.woods-mathews.com/ This test is not yet approved or cleared by the [place] FDA and  has been authorized for detection and/or diagnosis of SARS-CoV-2 by FDA under an Emergency Use Authorization (EUA). This EUA will remain  in effect (meaning this test can be used) for the duration of the COVID-19 declaration under Section 56 4(b)(1) of the Act, 21 U.S.C. section 360bbb-3(b)(1), unless the authorization is terminated or revoked sooner. Performed at [facility], [place] [address], [place], [place] [address]      Studies: Dg Chest 2 View  Result Date: 06/11/2019 CLINICAL DATA:  History of cardiac disease.  Fluid retention. EXAM: CHEST - 2 VIEW COMPARISON:  Single-view of the chest 05/14/2019. FINDINGS: There is cardiomegaly without edema. Small left pleural effusion and basilar atelectasis. Pacing device in place. Atherosclerosis noted. No acute or focal bony abnormality. IMPRESSION: Small left pleural effusion and basilar atelectasis. Cardiomegaly without edema. Atherosclerosis. Electronically Signed   By: Inge Rise M.D.   On: 06/11/2019 13:47    Scheduled Meds: . apixaban  2.5 mg Oral BID  . loratadine  10 mg Oral Daily  . pantoprazole  40 mg Oral Daily  . polyethylene glycol  17 g Oral Daily  . senna  1 tablet Oral QHS   Continuous Infusions:  Principal Problem:   Acute on chronic heart failure (HCC) Active Problems:   Dyspnea   Benign essential HTN   Atrial fibrillation (HCC)   Hyponatremia   Chronic respiratory failure with hypoxia  (HCC)   Occipital infarction (HCC)   CKD (chronic kidney disease)   OSA (obstructive sleep apnea)   Pacemaker    Time spent: 67 minutes    [place] NP  [facility]  If 7PM-7AM, please contact night-coverage at [email or web address], password [facility] 06/12/2019, 1:54 PM  LOS: 0 days

## 2019-06-13 ENCOUNTER — Encounter (HOSPITAL_COMMUNITY): Payer: Self-pay | Admitting: General Practice

## 2019-06-13 LAB — CBC
HCT: 38.9 % (ref 36.0–46.0)
Hemoglobin: 13.2 g/dL (ref 12.0–15.0)
MCH: 29.7 pg (ref 26.0–34.0)
MCHC: 33.9 g/dL (ref 30.0–36.0)
MCV: 87.6 fL (ref 80.0–100.0)
Platelets: DECREASED 10*3/uL (ref 150–400)
RBC: 4.44 MIL/uL (ref 3.87–5.11)
RDW: 17.9 % — ABNORMAL HIGH (ref 11.5–15.5)
WBC: 4.4 10*3/uL (ref 4.0–10.5)
nRBC: 0 % (ref 0.0–0.2)

## 2019-06-13 LAB — BASIC METABOLIC PANEL
Anion gap: 14 (ref 5–15)
BUN: 33 mg/dL — ABNORMAL HIGH (ref 8–23)
CO2: 20 mmol/L — ABNORMAL LOW (ref 22–32)
Calcium: 8.9 mg/dL (ref 8.9–10.3)
Chloride: 95 mmol/L — ABNORMAL LOW (ref 98–111)
Creatinine, Ser: 1.74 mg/dL — ABNORMAL HIGH (ref 0.44–1.00)
GFR calc Af Amer: 30 mL/min — ABNORMAL LOW (ref >60)
GFR calc non Af Amer: 26 mL/min — ABNORMAL LOW (ref >60)
Glucose, Bld: 113 mg/dL — ABNORMAL HIGH (ref 70–99)
Potassium: 5 mmol/L (ref 3.5–5.1)
Sodium: 129 mmol/L — ABNORMAL LOW (ref 135–145)

## 2019-06-13 LAB — MRSA PCR SCREENING: MRSA by PCR: POSITIVE — AB

## 2019-06-13 MED ORDER — ALUM & MAG HYDROXIDE-SIMETH 200-200-20 MG/5ML PO SUSP
15.0000 mL | Freq: Once | ORAL | Status: AC
  Administered 2019-06-13: 15 mL via ORAL
  Filled 2019-06-13: qty 30

## 2019-06-13 MED ORDER — FUROSEMIDE 10 MG/ML IJ SOLN
40.0000 mg | Freq: Once | INTRAMUSCULAR | Status: AC
  Administered 2019-06-13: 40 mg via INTRAVENOUS
  Filled 2019-06-13: qty 4

## 2019-06-13 MED ORDER — FUROSEMIDE 10 MG/ML IJ SOLN
40.0000 mg | Freq: Once | INTRAMUSCULAR | Status: AC
  Administered 2019-06-14: 07:00:00 40 mg via INTRAVENOUS
  Filled 2019-06-13: qty 4

## 2019-06-13 MED ORDER — ALUM & MAG HYDROXIDE-SIMETH 200-200-20 MG/5 ML NICU TOPICAL
1.0000 "application " | Freq: Once | TOPICAL | Status: DC
  Filled 2019-06-13: qty 355

## 2019-06-13 MED ORDER — DOCUSATE SODIUM 100 MG PO CAPS
100.0000 mg | ORAL_CAPSULE | Freq: Every day | ORAL | Status: DC
  Administered 2019-06-13 – 2019-06-17 (×5): 100 mg via ORAL
  Filled 2019-06-13 (×5): qty 1

## 2019-06-13 NOTE — Progress Notes (Signed)
[facility] PROGRESS NOTE  Kim Mullins JOI:[id number] DOB: 1930/06/30 DOA: 06/11/2019 PCP: Lin Landsman, MD  Assessment/Plan:  Acute on chronic systolic congestive heart failure. Likely related to disease progression. Pt spent last 3 weeks in facility ensuring med compliance. Not sure if daily weights obtained. She was discharged and went home for 1/2 day. Family noted edema and sob. Chart review indicates weight currently 65.5 kg up from 51.4kg 05/17/19. Home med include bumex. This was changed to lasix IV on admission. Last hospitalization Palliative care met with patient and family. Plan was for Palliative to follow at snf and that if patient did not improve, granddaughter Kim Mullins planned to take her home with Hospice. I spoke with granddaughter today and plan for home with Hospice remains goal. Chest xray with Small left pleural effusion and basilar atelectasis. BNP 1237.  Patient did not receive Lasix yesterday -Lasix 40 mg IV today and tomorrow -Accurate intake output -Weigh patient daily -Palliative consult  Acute on chronic renal failure stage III. Baseline creatine appears to be 1.3  -hold nephrotoxins as able -monitor urine output -Monitor creatinine while diuresing patient - continue with diuresis  Hyponatremia. Mild. Somewhat chronic. Sodium 130 on admission, currently 129, suspect volume overload. Continue with diuresis  A. fib, chronic, rate controlled. chadvasc score 7. Home meds include Eliquis.  -Continue with Eliquis  HTN. Fair control. Home meds include bumex,  -lasix as noted above -monitor BP  Chronic respiratory failure with hypoxia. Home oxygen. Chest xray with small left pleural effusion basilar atelectasis. Does no seem to have increased oxygen demand. Does not perceive self as sob.  -see above  Allergies Continue with Allegra  Code Status: full Family Communication: Museum/gallery exhibitions officer granddaughter. Disposition Plan: home likely with  hospice.  Palliative care consulted   Consultants:  Native care  Procedures:    Antibiotics:    HPI/Subjective: Patient seen and examined at bedside no acute distress and resting comfortably.  No events overnight.  Tolerating diet.  Denies any chest pain, shortness of breath, fever, nausea, vomiting, urinary complaints.  Admits to having bowel movement.  Otherwise ROS negative   Objective: Vitals:   06/13/19 1611 06/13/19 1636  BP: 104/86 107/80  Pulse: 63 (!) 59  Resp: 18 16  Temp:    SpO2: 100% 94%    Intake/Output Summary (Last 24 hours) at 06/13/2019 1902 Last data filed at 06/13/2019 1802 Gross per 24 hour  Intake 1080 ml  Output 1350 ml  Net -270 ml   Filed Weights   06/12/19 1433 06/13/19 0441  Weight: 65.9 kg 65.5 kg    Exam:   General:  Thin frail no acute distress  Cardiovascular: rrr +murmur 3+pitting edema  Respiratory: No increased work of breathing.  Bibasilar rales noted.   Abdomen: obese soft +BS no guarding or rebounding   Musculoskeletal: joints without swelling/erythema   Data Reviewed: Basic Metabolic Panel: Recent Labs  Lab 06/11/19 1327 06/13/19 0520  NA 130* 129*  K 4.1 5.0  CL 95* 95*  CO2 22 20*  GLUCOSE 126* 113*  BUN 32* 33*  CREATININE 1.92* 1.74*  CALCIUM 8.9 8.9   Liver Function Tests: No results for input(s): AST, ALT, ALKPHOS, BILITOT, PROT, ALBUMIN in the last 168 hours. No results for input(s): LIPASE, AMYLASE in the last 168 hours. No results for input(s): AMMONIA in the last 168 hours. CBC: Recent Labs  Lab 06/11/19 1327 06/13/19 0520  WBC 2.7* 4.4  HGB 13.8 13.2  HCT 43.0 38.9  MCV 91.1  87.6  PLT 147* PLATELET CLUMPS NOTED ON SMEAR, COUNT APPEARS DECREASED   Cardiac Enzymes: No results for input(s): CKTOTAL, CKMB, CKMBINDEX, TROPONINI in the last 168 hours. BNP (last 3 results) Recent Labs    10/09/18 1238 05/15/19 0516 06/11/19 1327  BNP 1,424.7* 1,217.6* 1,237.5*    ProBNP (last 3  results) No results for input(s): PROBNP in the last 8760 hours.  CBG: Recent Labs  Lab 06/12/19 1127  GLUCAP 169*    Recent Results (from the past 240 hour(s))  SARS CORONAVIRUS 2 (TAT 6-24 HRS) Nasopharyngeal Nasopharyngeal Swab     Status: None   Collection Time: 06/11/19  4:22 PM   Specimen: Nasopharyngeal Swab  Result Value Ref Range Status   SARS Coronavirus 2 NEGATIVE NEGATIVE Final    Comment: (NOTE) SARS-CoV-2 target nucleic acids are NOT DETECTED. The SARS-CoV-2 RNA is generally detectable in upper and lower respiratory specimens during the acute phase of infection. Negative results do not preclude SARS-CoV-2 infection, do not rule out co-infections with other pathogens, and should not be used as the sole basis for treatment or other patient management decisions. Negative results must be combined with clinical observations, patient history, and epidemiological information. The expected result is Negative. Fact Sheet for Patients: SugarRoll.be Fact Sheet for Healthcare Providers: https://www.woods-mathews.com/ This test is not yet approved or cleared by the [place] FDA and  has been authorized for detection and/or diagnosis of SARS-CoV-2 by FDA under an Emergency Use Authorization (EUA). This EUA will remain  in effect (meaning this test can be used) for the duration of the COVID-19 declaration under Section 56 4(b)(1) of the Act, 21 U.S.C. section 360bbb-3(b)(1), unless the authorization is terminated or revoked sooner. Performed at [facility], [place] [address], [place], [place] [address]   MRSA PCR Screening     Status: Abnormal   Collection Time: 06/13/19 11:28 AM   Specimen: Nasal Mucosa; Nasopharyngeal  Result Value Ref Range Status   MRSA by PCR POSITIVE (A) NEGATIVE Final    Comment:        The GeneXpert MRSA Assay (FDA approved for NASAL specimens only), is one component of a comprehensive MRSA  colonization surveillance program. It is not intended to diagnose MRSA infection nor to guide or monitor treatment for MRSA infections. RESULT CALLED TO, READ BACK BY AND VERIFIED WITH: RN D [facility] [id number] AT 1511 BY CM Performed at [facility], [place] [address] [place], [place] [address]      Studies: No results found.  Scheduled Meds: . apixaban  2.5 mg Oral BID  . loratadine  10 mg Oral Daily  . pantoprazole  40 mg Oral Daily  . polyethylene glycol  17 g Oral Daily  . senna  1 tablet Oral QHS   Continuous Infusions:  Principal Problem:   Acute on chronic heart failure (HCC) Active Problems:   Dyspnea   Benign essential HTN   OSA (obstructive sleep apnea)   Pacemaker   Occipital infarction (HCC)   Atrial fibrillation (HCC)   CKD (chronic kidney disease)   Hyponatremia   Chronic respiratory failure with hypoxia ([place])    Time spent: 40 minutes    Harold Hedge NP  [facility]  If 7PM-7AM, please contact night-coverage at [email or web address], password [facility] 06/13/2019, 7:02 PM  LOS: 1 day

## 2019-06-13 NOTE — Plan of Care (Signed)
°  Problem: Education: °Goal: Ability to demonstrate management of disease process will improve °Outcome: Progressing °Goal: Ability to verbalize understanding of medication therapies will improve °Outcome: Progressing °Goal: Individualized Educational Video(s) °Outcome: Progressing °  °

## 2019-06-13 NOTE — Discharge Instructions (Signed)

## 2019-06-13 NOTE — Plan of Care (Signed)
?  Problem: Activity: ?Goal: Capacity to carry out activities will improve ?Outcome: Progressing ?  ?

## 2019-06-13 NOTE — Plan of Care (Signed)
  Problem: Education: Goal: Ability to demonstrate management of disease process will improve Outcome: Progressing Goal: Ability to verbalize understanding of medication therapies will improve Outcome: Progressing   Problem: Cardiac: Goal: Ability to achieve and maintain adequate cardiopulmonary perfusion will improve Outcome: Progressing   Problem: Activity: Goal: Risk for activity intolerance will decrease Outcome: Progressing   Problem: Nutrition: Goal: Adequate nutrition will be maintained Outcome: Progressing   Problem: Elimination: Goal: Will not experience complications related to bowel motility Outcome: Progressing Goal: Will not experience complications related to urinary retention Outcome: Progressing   Problem: Pain Managment: Goal: General experience of comfort will improve Outcome: Progressing   Problem: Safety: Goal: Ability to remain free from injury will improve Outcome: Progressing   Problem: Skin Integrity: Goal: Risk for impaired skin integrity will decrease Outcome: Progressing

## 2019-06-14 ENCOUNTER — Ambulatory Visit: Payer: Medicare HMO | Admitting: Family Medicine

## 2019-06-14 DIAGNOSIS — Z0289 Encounter for other administrative examinations: Secondary | ICD-10-CM

## 2019-06-14 DIAGNOSIS — R531 Weakness: Secondary | ICD-10-CM

## 2019-06-14 DIAGNOSIS — Z515 Encounter for palliative care: Secondary | ICD-10-CM

## 2019-06-14 LAB — BASIC METABOLIC PANEL
Anion gap: 17 — ABNORMAL HIGH (ref 5–15)
BUN: 33 mg/dL — ABNORMAL HIGH (ref 8–23)
CO2: 17 mmol/L — ABNORMAL LOW (ref 22–32)
Calcium: 9 mg/dL (ref 8.9–10.3)
Chloride: 93 mmol/L — ABNORMAL LOW (ref 98–111)
Creatinine, Ser: 1.7 mg/dL — ABNORMAL HIGH (ref 0.44–1.00)
GFR calc Af Amer: 31 mL/min — ABNORMAL LOW (ref >60)
GFR calc non Af Amer: 27 mL/min — ABNORMAL LOW (ref >60)
Glucose, Bld: 123 mg/dL — ABNORMAL HIGH (ref 70–99)
Potassium: 4.3 mmol/L (ref 3.5–5.1)
Sodium: 127 mmol/L — ABNORMAL LOW (ref 135–145)

## 2019-06-14 LAB — CBC
HCT: 39.4 % (ref 36.0–46.0)
Hemoglobin: 13.2 g/dL (ref 12.0–15.0)
MCH: 29.1 pg (ref 26.0–34.0)
MCHC: 33.5 g/dL (ref 30.0–36.0)
MCV: 87 fL (ref 80.0–100.0)
Platelets: 131 10*3/uL — ABNORMAL LOW (ref 150–400)
RBC: 4.53 MIL/uL (ref 3.87–5.11)
RDW: 17.9 % — ABNORMAL HIGH (ref 11.5–15.5)
WBC: 3.4 10*3/uL — ABNORMAL LOW (ref 4.0–10.5)
nRBC: 0 % (ref 0.0–0.2)

## 2019-06-14 MED ORDER — SODIUM CHLORIDE 0.9 % IV BOLUS
250.0000 mL | Freq: Once | INTRAVENOUS | Status: AC
  Administered 2019-06-14: 250 mL via INTRAVENOUS

## 2019-06-14 MED ORDER — MUPIROCIN 2 % EX OINT
1.0000 "application " | TOPICAL_OINTMENT | Freq: Two times a day (BID) | CUTANEOUS | Status: DC
  Administered 2019-06-14 – 2019-06-17 (×6): 1 via NASAL
  Filled 2019-06-14: qty 22

## 2019-06-14 MED ORDER — CHLORHEXIDINE GLUCONATE CLOTH 2 % EX PADS
6.0000 | MEDICATED_PAD | Freq: Every day | CUTANEOUS | Status: DC
  Administered 2019-06-15 – 2019-06-16 (×2): 6 via TOPICAL

## 2019-06-14 MED ORDER — BUMETANIDE 2 MG PO TABS: 4.0000 mg | ORAL_TABLET | Freq: Two times a day (BID) | ORAL | Status: DC

## 2019-06-14 MED ORDER — BUMETANIDE 2 MG PO TABS
4.0000 mg | ORAL_TABLET | Freq: Two times a day (BID) | ORAL | Status: DC
  Filled 2019-06-14: qty 2

## 2019-06-14 NOTE — TOC Initial Note (Signed)
Transition of Care [facility]) - Initial/Assessment Note    Patient Details  Name: Jaidee Stipe MRN: [id number] Date of Birth: 1930-03-10  Transition of Care [facility]) CM/SW Contact:    Zenon Mayo, RN Phone Number: 06/14/2019, 3:51 PM  Clinical Narrative:                 From home, NCM received consult for home hospice, NCM contacted POA, granddaughter [name] , offered choice, she chose [facility], NCM made referral to [facility].  Patient has a walker, rollator, and a shower chair and home oxygen with [employer] (2 liters continously), she does not want a hospital bed. Harmon Pier will contact granddaughter.  Expected Discharge Plan: Home w Hospice Care Barriers to Discharge: No Barriers Identified   Patient Goals and CMS Choice Patient states their goals for this hospitalization and ongoing recovery are:: home with hospice CMS Medicare.gov Compare Post Acute Care list provided to:: Patient Represenative (must comment)(grand daughter) Choice offered to / list presented to : [facility] POA / Guardian(granddaughter)  Expected Discharge Plan and Services Expected Discharge Plan: Home w Hospice Care In-house Referral: Hospice / Palliative Care Discharge Planning Services: CM Consult Post Acute Care Choice: Hospice Living arrangements for the past 2 months: Single Family Home                 DME Arranged: (Hospice to supply DME)         HH Arranged: RN [place] Agency: Hospice and [facility] Date [facility] Agency Contacted: 06/14/19 Time HH Agency Contacted: 66 Representative spoke with at [facility]: Harmon Pier  Prior Living Arrangements/Services Living arrangements for the past 2 months: [place] Lives with:: Self Patient language and need for interpreter reviewed:: Yes Do you feel safe going back to the place where you live?: Yes      Need for Family Participation in Patient Care: Yes (Comment) Care giver support system in place?: Yes (comment) Current home services:  Hospice Criminal Activity/Legal Involvement Pertinent to Current Situation/Hospitalization: No - Comment as needed  Activities of Daily Living Home Assistive Devices/Equipment: Dentures (specify type), Eyeglasses ADL Screening (condition at time of admission) Patient's cognitive ability adequate to safely complete daily activities?: No Is the patient deaf or have difficulty hearing?: No Does the patient have difficulty seeing, even when wearing glasses/contacts?: No Does the patient have difficulty concentrating, remembering, or making decisions?: No Patient able to express need for assistance with ADLs?: Yes Does the patient have difficulty dressing or bathing?: Yes Independently performs ADLs?: Yes (appropriate for developmental age) Does the patient have difficulty walking or climbing stairs?: Yes Weakness of Legs: Both Weakness of Arms/Hands: None  Permission Sought/Granted                  Emotional Assessment Appearance:: Appears stated age       Alcohol / Substance Use: Not Applicable Psych Involvement: No (comment)  Admission diagnosis:  Acute congestive heart failure, unspecified heart failure type ([place]) [I50.9] Acute on chronic heart failure ([place]) [I50.9] Patient Active Problem List   Diagnosis Date Noted  . Weakness generalized   . Congestive heart failure (CHF) ([place]) 06/11/2019  . Chronic respiratory failure with hypoxia ([place]) 05/29/2019  . Chronic constipation 05/29/2019  . Chronic non-seasonal allergic rhinitis 05/29/2019  . 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 FTT (failure to thrive) in adult 05/15/2019  . Hematemesis 05/14/2019  . Chronic systolic CHF (congestive heart failure) ([place]) 05/14/2019  . Acute renal failure superimposed on stage 3 chronic kidney  disease ([place]) 05/14/2019  . Fall 05/14/2019  . Hyponatremia 05/14/2019  . Hyperkalemia 05/14/2019  . Obesity (BMI 30-39.9) 10/20/2018  . Acute on chronic heart failure ([place]) 08/19/2018  . DNR (do not  resuscitate) discussion   . Palliative care by specialist   . Acute respiratory failure with hypoxia ([place]) 08/18/2018  . Diabetes ([place]) 08/18/2018  . CKD (chronic kidney disease) 08/18/2018  . Amyloidosis ([place]) 08/18/2018  . Acute on chronic combined systolic and diastolic heart failure ([place]) 08/17/2018  . GERD (gastroesophageal reflux disease) 08/17/2018  . Elevated troponin 08/17/2018  . Glaucoma 08/17/2018  . Hyperbilirubinemia 08/17/2018  . CHF (congestive heart failure) ([place]) 07/18/2018  . Gait abnormality 05/03/2017  . Hypotension 04/14/2016  . Hypotension due to drugs   . Chronic anticoagulation   . TIA (transient ischemic attack) 04/12/2016  . UTI (lower urinary tract infection) 04/12/2016  . AKI (acute kidney injury) ([place]) 04/12/2016  . Atrial fibrillation ([place]) 09/12/2015  . Occipital infarction ([place])   . History of stroke 06/18/2015  . Homonymous hemianopsia   . Pacemaker 01/21/2015  . OSA (obstructive sleep apnea) 10/23/2014  . Dizziness 10/21/2014  . Complete heart block ([place]) 10/06/2014  . Benign essential HTN 10/04/2014  . Acute on chronic systolic CHF (congestive heart failure) ([place]) 10/04/2014  . Abnormal cardiovascular stress test 08/27/2014  . Pulmonary HTN ([place]) 08/16/2014  . DCM (dilated cardiomyopathy) ([place]) 08/16/2014  . Cough 07/13/2014  . Esophageal stricture 06/11/2014  . Nonspecific (abnormal) findings on radiological and other examination of gastrointestinal tract 05/29/2014  . Dyspnea 04/29/2014  . Osteoarthritis of right shoulder region 06/26/2013  . DJD (degenerative joint disease) of hip 11/16/2011    Class: Present on Admission   PCP:  Lin Landsman, MD Pharmacy:   [facility] [place], [place] [address] [place] [address] Phone: [phone] Fax: [phone]     Social Determinants of Health (SDOH) Interventions    Readmission Risk  Interventions No flowsheet data found.

## 2019-06-14 NOTE — Care Management Important Message (Signed)
Important Message  Patient Details  Name: Claritza July MRN: [id number] Date of Birth: 10/16/1929   Medicare Important Message Given:  Yes     Shelda Altes 06/14/2019, 1:59 PM

## 2019-06-14 NOTE — Progress Notes (Signed)
[facility]  Received request from [place] for family interest in hospice services at home after discharge. Chart reviewed. Eligibility pending at this time. Will follow up with family and [facility] manager tomorrow morning.   Thank you,  Erling Conte, LCSW [phone]

## 2019-06-14 NOTE — Progress Notes (Signed)
PT Cancellation Note  Patient Details Name: Kim Mullins MRN: [id number] DOB: 1930/08/23   Cancelled Treatment:    Reason Eval/Treat Not Completed: Other (comment)Declined PT stating she is too tired.  Will try again at another time.   Ramond Dial 06/14/2019, 11:25 AM   Mee Hives, PT MS Acute Rehab Dept. Number: [place] and [place]

## 2019-06-14 NOTE — Progress Notes (Signed)
[facility] PROGRESS NOTE  Kim Mullins NLG:[id number] DOB: Dec 14, 1929 DOA: 06/11/2019 PCP: Lin Landsman, MD  Assessment/Plan:  Acute on chronic systolic congestive heart failure.  Weight remains unchanged, poor output with Lasix 40 mg IV twice daily. Pt spent last 3 weeks in facility ensuring med compliance. Not sure if daily weights obtained. She was discharged and went home for 1/2 day. Family noted edema and sob. Home med include bumex. . Last hospitalization Palliative care met with patient and family.  Prior plan was for Palliative to follow at snf and that if patient did not improve, granddaughter planned to take her home with Hospice. Chest xray with Small left pleural effusion and basilar atelectasis. BNP 1237.  Received Lasix IV this AM  - palliative meeting today: Patient is to remain full code but will transition to home with home hospice with goals for comfort -Accurate intake output -Weigh patient daily -Restart home Bumex tomorrow  Acute on chronic renal failure stage III.  At/near baseline baseline creatine appears to be 1.3  -hold nephrotoxins as able -monitor urine output -Monitor creatinine while diuresing patient -Restart home Bumex tomorrow  Hyponatremia. Mild. Somewhat chronic. Sodium 130 on admission, currently 129, may have a component of dehydration after diuresis.  We will give a small fluid bolus this evening  A. fib, chronic, rate controlled. chadvasc score 7. Home meds include Eliquis.  -Continue with Eliquis  HTN. Fair control.  Holding antihypertensives due to soft blood pressures and giving small fluid bolus -monitor BP  Chronic respiratory failure with hypoxia. Home oxygen. Chest xray with small left pleural effusion basilar atelectasis. stable -see above  Allergies Continue with Claritin  Code Status: full Disposition Plan: Patient will likely would be medically stable for discharge tomorrow.  Plan for home with  hospice   Consultants:  Palliative  HPI/Subjective: Patient seen and examined at bedside no acute distress and resting comfortably.  No events overnight.  Tolerating diet.  Appeared to have conversational dyspnea but states that she is unaware of this  Denies any chest pain, shortness of breath, fever, nausea, vomiting, urinary complaints. Otherwise ROS negative    Objective: Vitals:   06/14/19 0040 06/14/19 0521  BP: 99/77 103/81  Pulse: 60 61  Resp: 18 18  Temp: (!) 97.4 F (36.3 C) (!) 97.5 F (36.4 C)  SpO2: 99% 94%    Intake/Output Summary (Last 24 hours) at 06/14/2019 0741 Last data filed at 06/14/2019 0600 Gross per 24 hour  Intake 840 ml  Output 600 ml  Net 240 ml   Filed Weights   06/12/19 1433 06/13/19 0441 06/14/19 0100  Weight: 65.9 kg 65.5 kg 65.8 kg    Exam:   General:  Thin frail no acute distress  Cardiovascular: rrr +murmur 3+pitting edema  Respiratory: Unaware of conversational dyspnea.  No rales or wheeze  Abdomen: obese soft +BS no guarding or rebounding   Musculoskeletal: Trace bilateral lower extremity pitting edema  Data Reviewed: Basic Metabolic Panel: Recent Labs  Lab 06/11/19 1327 06/13/19 0520  NA 130* 129*  K 4.1 5.0  CL 95* 95*  CO2 22 20*  GLUCOSE 126* 113*  BUN 32* 33*  CREATININE 1.92* 1.74*  CALCIUM 8.9 8.9   Liver Function Tests: No results for input(s): AST, ALT, ALKPHOS, BILITOT, PROT, ALBUMIN in the last 168 hours. No results for input(s): LIPASE, AMYLASE in the last 168 hours. No results for input(s): AMMONIA in the last 168 hours. CBC: Recent Labs  Lab 06/11/19 1327 06/13/19 0520  WBC 2.7* 4.4  HGB 13.8 13.2  HCT 43.0 38.9  MCV 91.1 87.6  PLT 147* PLATELET CLUMPS NOTED ON SMEAR, COUNT APPEARS DECREASED   Cardiac Enzymes: No results for input(s): CKTOTAL, CKMB, CKMBINDEX, TROPONINI in the last 168 hours. BNP (last 3 results) Recent Labs    10/09/18 1238 05/15/19 0516 06/11/19 1327  BNP  1,424.7* 1,217.6* 1,237.5*    ProBNP (last 3 results) No results for input(s): PROBNP in the last 8760 hours.  CBG: Recent Labs  Lab 06/12/19 1127  GLUCAP 169*    Recent Results (from the past 240 hour(s))  SARS CORONAVIRUS 2 (TAT 6-24 HRS) Nasopharyngeal Nasopharyngeal Swab     Status: None   Collection Time: 06/11/19  4:22 PM   Specimen: Nasopharyngeal Swab  Result Value Ref Range Status   SARS Coronavirus 2 NEGATIVE NEGATIVE Final    Comment: (NOTE) SARS-CoV-2 target nucleic acids are NOT DETECTED. The SARS-CoV-2 RNA is generally detectable in upper and lower respiratory specimens during the acute phase of infection. Negative results do not preclude SARS-CoV-2 infection, do not rule out co-infections with other pathogens, and should not be used as the sole basis for treatment or other patient management decisions. Negative results must be combined with clinical observations, patient history, and epidemiological information. The expected result is Negative. Fact Sheet for Patients: SugarRoll.be Fact Sheet for Healthcare Providers: https://www.woods-mathews.com/ This test is not yet approved or cleared by the [place] FDA and  has been authorized for detection and/or diagnosis of SARS-CoV-2 by FDA under an Emergency Use Authorization (EUA). This EUA will remain  in effect (meaning this test can be used) for the duration of the COVID-19 declaration under Section 56 4(b)(1) of the Act, 21 U.S.C. section 360bbb-3(b)(1), unless the authorization is terminated or revoked sooner. Performed at [facility], [place] [address] [place], [place] [address]   MRSA PCR Screening     Status: Abnormal   Collection Time: 06/13/19 11:28 AM   Specimen: Nasal Mucosa; Nasopharyngeal  Result Value Ref Range Status   MRSA by PCR POSITIVE (A) NEGATIVE Final    Comment:        The GeneXpert MRSA Assay (FDA approved for NASAL  specimens only), is one component of a comprehensive MRSA colonization surveillance program. It is not intended to diagnose MRSA infection nor to guide or monitor treatment for MRSA infections. RESULT CALLED TO, READ BACK BY AND VERIFIED WITH: RN D [facility] [place] [id number] AT 1511 BY CM Performed at [facility], [place] [address] [place], [place] [address]      Studies: No results found.  Scheduled Meds: . apixaban  2.5 mg Oral BID  . docusate sodium  100 mg Oral Daily  . loratadine  10 mg Oral Daily  . pantoprazole  40 mg Oral Daily  . polyethylene glycol  17 g Oral Daily  . senna  1 tablet Oral QHS   Continuous Infusions:  Principal Problem:   Acute on chronic heart failure (HCC) Active Problems:   Dyspnea   Benign essential HTN   OSA (obstructive sleep apnea)   Pacemaker   Occipital infarction (HCC)   Atrial fibrillation (HCC)   CKD (chronic kidney disease)   Hyponatremia   Chronic respiratory failure with hypoxia ([place])    Time spent: 25 minutes    Harold Hedge DO  [facility]  If 7PM-7AM, please contact night-coverage at [email or web address], password [facility] 06/14/2019, 7:41 AM  LOS: 2 days

## 2019-06-14 NOTE — Evaluation (Signed)
Physical Therapy Evaluation Patient Details Name: Kim Mullins MRN: [id number] DOB: 1929-10-12 Today's Date: 06/14/2019   History of Present Illness  85 yo female was admitted after a stay in SNF for rehab, has now LE edema with hypoxemia a day after dc from the facility.  Has cardiomegaly, pleural effusion, atelectasis.  PMHx:  falls, coffee ground emesis, HTN, DM, asthma on 2L O2, CVA, complete heart block, ICD, CHF with EF 35%, CKD 3, a-fib, tibial fracture, THR, total shoulder arthroscopy  Clinical Impression  Pt was seen for mobility today, with good response to her effort to stand up. While pt was fatigued, she also maintained O2 sats at 97% from initial 100% and pulse was 63 at end of sidesteps.  Pt is using bed to support standing effort, and so would recommend her performance indicates SNF care be most advantageous.  However, family has elected to take her home and will refuse this care level.  Follow up acutely for most safe and independent transfer home.    Follow Up Recommendations SNF    Equipment Recommendations  None recommended by PT    Recommendations for Other Services       Precautions / Restrictions Precautions Precautions: Fall;ICD/Pacemaker Precaution Comments: monitor vitals Restrictions Weight Bearing Restrictions: No      Mobility  Bed Mobility Overal bed mobility: Needs Assistance Bed Mobility: Supine to Sit;Sit to Supine     Supine to sit: Min assist Sit to supine: Min assist   General bed mobility comments: assisted to sit up with trunk and back helping legs  Transfers Overall transfer level: Needs assistance Equipment used: Rolling walker (2 wheeled) Transfers: Sit to/from Stand Sit to Stand: Mod assist;Max assist         General transfer comment: mod to power up and then pt used bed for leg support  Ambulation/Gait Ambulation/Gait assistance: Min assist;Mod assist Gait Distance (Feet): 9 Feet Assistive device: Rolling walker  (2 wheeled) Gait Pattern/deviations: Step-to pattern;Decreased stride length;Wide base of support;Trunk flexed Gait velocity: reduced Gait velocity interpretation: <1.8 ft/sec, indicate of risk for recurrent falls General Gait Details: step to pattern with RW and cues for sequence  Stairs Stairs: (deferred)          Wheelchair Mobility    Modified Rankin (Stroke Patients Only)       Balance Overall balance assessment: Needs assistance Sitting-balance support: Bilateral upper extremity supported;Feet supported Sitting balance-Leahy Scale: Fair     Standing balance support: Bilateral upper extremity supported;During functional activity Standing balance-Leahy Scale: Poor Standing balance comment: requires walker support but also cues for how to assist standing and controlling balance                             Pertinent Vitals/Pain Pain Assessment: No/denies pain    Home Living Family/patient expects to be discharged to:: Private residence Living Arrangements: Alone Available Help at Discharge: Family;Available 24 hours/day Type of Home: House Home Access: Stairs to enter Entrance Stairs-Rails: None Entrance Stairs-Number of Steps: 1 Home Layout: One level Home Equipment: Walker - 2 wheels;Walker - 4 wheels;Cane - single point;Bedside commode;Shower seat;Toilet riser Additional Comments: usually lives alone, grand-daughter is with her now 24/7 as she is able to work from home during the pandemic but family usually cannot be with her    Prior Function           Comments: rollator inside, longer outside trips and [facility] for short transition to the  car outside     Hand Dominance   Dominant Hand: Right    Extremity/Trunk Assessment   Upper Extremity Assessment Upper Extremity Assessment: Generalized weakness    Lower Extremity Assessment Lower Extremity Assessment: Generalized weakness    Cervical / Trunk Assessment Cervical / Trunk Assessment:  Kyphotic  Communication   Communication: HOH  Cognition Arousal/Alertness: Awake/alert Behavior During Therapy: WFL for tasks assessed/performed Overall Cognitive Status: History of cognitive impairments - at baseline Area of Impairment: Memory;Following commands;Safety/judgement;Awareness;Problem solving                     Memory: Decreased short-term memory Following Commands: Follows one step commands inconsistently;Follows one step commands with increased time Safety/Judgement: Decreased awareness of safety;Decreased awareness of deficits Awareness: Intellectual;Emergent Problem Solving: Slow processing;Requires verbal cues General Comments: verbal cues for hand placement and sequence of sidestepping      General Comments General comments (skin integrity, edema, etc.): pt is quite weak to stand, mod assist to max from higher surface, tries to use walker to reach out and pull with no success.  Talked her through technique and to initiate stand is max, then mod to complete task     Exercises     Assessment/Plan    PT Assessment Patient needs continued PT services  PT Problem List Decreased strength;Decreased range of motion;Decreased activity tolerance;Decreased balance;Decreased mobility;Decreased coordination;Decreased knowledge of use of DME;Decreased safety awareness;Cardiopulmonary status limiting activity       PT Treatment Interventions DME instruction;Gait training;Stair training;Functional mobility training;Therapeutic activities;Therapeutic exercise;Balance training;Neuromuscular re-education;Patient/family education    PT Goals (Current goals can be found in the Care Plan section)  Acute Rehab PT Goals Patient Stated Goal: to get home and be safe PT Goal Formulation: With family Time For Goal Achievement: 06/28/19 Potential to Achieve Goals: Fair    Frequency Min 3X/week   Barriers to discharge Inaccessible home environment;Decreased caregiver  support home alone with a step to enter    Co-evaluation               AM-PAC PT "6 Clicks" Mobility  Outcome Measure Help needed turning from your back to your side while in a flat bed without using bedrails?: A Lot Help needed moving from lying on your back to sitting on the side of a flat bed without using bedrails?: A Lot Help needed moving to and from a bed to a chair (including a wheelchair)?: A Lot Help needed standing up from a chair using your arms (e.g., wheelchair or bedside chair)?: A Lot Help needed to walk in hospital room?: A Lot Help needed climbing 3-5 steps with a railing? : Total 6 Click Score: 11    End of Session Equipment Utilized During Treatment: Gait belt;Oxygen Activity Tolerance: Patient limited by fatigue;Patient limited by lethargy;Treatment limited secondary to medical complications (Comment) Patient left: in bed;with call bell/phone within reach;with bed alarm set;with family/visitor present Nurse Communication: Mobility status PT Visit Diagnosis: Unsteadiness on feet (R26.81);Muscle weakness (generalized) (M62.81);History of falling (Z91.81);Difficulty in walking, not elsewhere classified (R26.2);Adult, failure to thrive (R62.7)    Time: 6256-3893 PT Time Calculation (min) (ACUTE ONLY): 29 min   Charges:   PT Evaluation $PT Eval Moderate Complexity: 1 Mod PT Treatments $Therapeutic Activity: 8-22 mins       Ramond Dial 06/14/2019, 4:41 PM   Mee Hives, PT MS Acute Rehab Dept. Number: [place] [facility] [place]

## 2019-06-14 NOTE — Consult Note (Addendum)
Consultation Note Date: 06/14/2019   Patient Name: Kim Mullins  DOB: 08/01/1930  MRN: [id number]  Age / Sex: 85 y.o., female  PCP: Lin Landsman, MD Referring Physician: Harold Hedge, MD  Reason for Consultation: Establish GOCs and emotional support  HPI/Patient Profile: 85 y.o. female  admitted on 06/11/2019 with a past medical history significant for hypertension, systolic congestive heart failure,last echo done in 2019 and ejection fraction of 35-40% who was discharged from skilled Rehab facility yesterday and continued to to have swelling that was noticed by her grand-daughter who is here [place] from [place], and will be here through the end of the year.  Patient was admitted for treatment and stablization  Patient is on home oxygen with periods of hypoxemia noted in the emergency room . Work-up in the emergency room showed sodium 130, BNP 1237, white blood cell count of 2.7 and hemoglobin of 13.8. Chest x-ray showed small left pleural effusion and basilar atelectasis with cardiomegaly .  Patient is failing to thrive 2/2 to multiple co-morbid ites.  She is high risk for decompensation.  Family face treatment option decision, advanced directive decision and anticipatory care needs.    Clinical Assessment and Goals of Care:   This NP Wadie Lessen reviewed medical records, received report from team, then spoke to [place] to discuss diagnosis, prognosis, GOC, EOL wishes disposition and options.  Concept of Hospice and Palliative Care were discussed  A detailed discussion was had today regarding advanced directives.  Concepts specific to code status, artifical feeding and hydration, continued IV antibiotics and rehospitalization was had.  The difference between a aggressive medical intervention path  and a palliative comfort care path for this patient at this time was had.   Values and goals of care important to patient and family were attempted to be elicited.  Grand-daughter/HPOA tells me the patient told her she "wanted everything done", and now HPOA  struggles with decisions that will be posed to her regarding aggressiveness of medical intervetnions. We discussed the importance of revisiting these decisions based on the patient's current medical situation.  We discussed human mortality and the limitations of medical interventions to prolong quality of life when a body begins to fail to thrive.  Emotional support offered.    Questions and concerns addressed.   Family encouraged to call with questions or concerns.    PMT will continue to support holistically.   HPOA/ [employer]- document is in EMR   SUMMARY OF RECOMMENDATIONS    Code Status/Advance Care Planning:  Full code  Comfort is a priority    Psycho-social/Spiritual:   Desire for further Chaplaincy support:yes   Additional Recommendations: created space and opportunity for family to explore thoughts and feelings regarding current medical situation   Prognosis:   Less than 6 months  Discharge Planning: Home with hospice services when medically maximized.    Hopefully once hospice is involved in the home they can continue conversations regarding GOCs and ADs helping patient and her family  set boundaries regarding aggressive medical interventions.     Primary Diagnoses: Present on Admission: . Pacemaker . Occipital infarction ([place]) . OSA (obstructive sleep apnea) . Atrial fibrillation ([place]) . Benign essential HTN . Hyponatremia . Dyspnea . CKD (chronic kidney disease) . Chronic respiratory failure with hypoxia ([place])   I have reviewed the medical record, interviewed the patient and family, and examined the patient. The following aspects are pertinent.  Past Medical History:  Diagnosis Date  . Anemia    occassionally  . Arthritis    all over.  . Asthma    Allergixc  reaction to cats.  . Atrial fibrillation ([place])   . Bowel obstruction ([place])   . Chronic systolic CHF (congestive heart failure) (H[facility])    EF 30-35% by cath, echo 2016 EF 50%  . Complete heart block ([facility])    a. s/p MDT CRTP pacemaker  . DCM (dilated cardiomyopathy) ([place]) 08/16/2014   normal coronary arteries on cath with EF 30-45%.  EF now 50% by echo 11/2014  . Diabetes mellitus 2006   Diet and exercise controlled.  Marland Kitchen Dyspnea   . GERD (gastroesophageal reflux disease)    occ  . Glaucoma 08/17/2018  . Hypertension   . Left tibial fracture 2007  . OSA (obstructive sleep apnea) 10/23/2014   Moderate with AHI 21/hr  . Osteoarthritis of right shoulder region 06/26/2013  . Stroke [facility])    Social History   Socioeconomic History  . Marital status: Widowed    Spouse name: Not on file  . Number of children: 0  . Years of education: Not on file  . Highest education level: Doctorate  Occupational History  . Occupation: retired  Scientific laboratory technician  . Financial resource strain: Not hard at all  . Food insecurity    Worry: Never true    Inability: Never true  . Transportation needs    Medical: No    Non-medical: No  Tobacco Use  . Smoking status: Former Smoker    Packs/day: 1.00    Years: 45.00    Pack years: 45.00    Types: Cigarettes    Quit date: 07/24/1978    Years since quitting: 40.9  . Smokeless tobacco: Never Used  Substance and Sexual Activity  . Alcohol use: Yes    Alcohol/week: 7.0 standard drinks    Types: 7 Glasses of wine per week    Comment: every day  . Drug use: No  . Sexual activity: Not Currently  Lifestyle  . Physical activity    Days per week: Not on file    Minutes per session: Not on file  . Stress: Not on file  Relationships  . Social Herbalist on phone: Not on file    Gets together: Not on file    Attends religious service: Not on file    Active member of club or organization: Not on file    Attends meetings of clubs or organizations: Not  on file    Relationship status: Not on file  Other Topics Concern  . Not on file  Social History Narrative   Lives alone   Family History  Problem Relation Age of Onset  . Dementia Mother   . Coronary artery disease Mother   . Cancer Father        blood ? type  . Diabetes Maternal Grandfather   . Anuerysm Daughter        brain  . Colon cancer Neg Hx    Scheduled Meds: .  apixaban  2.5 mg Oral BID  . docusate sodium  100 mg Oral Daily  . loratadine  10 mg Oral Daily  . pantoprazole  40 mg Oral Daily  . polyethylene glycol  17 g Oral Daily  . senna  1 tablet Oral QHS   Continuous Infusions: PRN Meds:.acetaminophen, ondansetron **OR** ondansetron (ZOFRAN) IV Medications Prior to Admission:  Prior to Admission medications   Medication Sig Start Date End Date Taking? Authorizing Provider  Acetaminophen (TYLENOL) 325 MG CAPS Take 650 mg by mouth daily as needed (for shoulder pain).    Yes [provider]  apixaban (ELIQUIS) 2.5 MG TABS tablet Take 1 tablet (2.5 mg total) by mouth 2 (two) times daily. 06/08/19  Yes Gerlene Fee, NP  bumetanide (BUMEX) 2 MG tablet Take 2 tablets (4 mg total) by mouth 2 (two) times daily. 06/08/19  Yes Gerlene Fee, NP  diclofenac sodium (VOLTAREN) 1 % GEL Apply 2 g topically 3 (three) times daily as needed (pain, inflammation). 06/08/19  Yes Gerlene Fee, NP  fexofenadine (ALLEGRA) 180 MG tablet Take 1 tablet (180 mg total) by mouth daily. 06/08/19  Yes Gerlene Fee, NP  glucose blood test strip Use to test blood sugar twice daily ([employer] [facility]) 07/19/16  Yes Larey Dresser, MD  hydrocortisone cream 1 % Apply topically daily as needed for itching. Cream; amt: sparingly; topical Special Instructions: apply to itchy skin Once A Day - PRN PRN 1 06/08/19  Yes Gerlene Fee, NP  latanoprost (XALATAN) 0.005 % ophthalmic solution Place 1 drop into both eyes at bedtime. 06/08/19  Yes Gerlene Fee, NP  OXYGEN Inhale 2 L into the  lungs continuous.    Yes [provider]  pantoprazole (PROTONIX) 40 MG tablet Take 1 tablet (40 mg total) by mouth daily. 06/08/19  Yes Gerlene Fee, NP  polyethylene glycol (MIRALAX / GLYCOLAX) 17 g packet Take 17 g by mouth daily. 05/26/19  Yes Shelly Coss, MD  senna (SENOKOT) 8.6 MG TABS tablet Take 1 tablet (8.6 mg total) by mouth at bedtime. 05/25/19  Yes Shelly Coss, MD  timolol (TIMOPTIC) 0.5 % ophthalmic solution Place 1 drop into both eyes at bedtime. 06/08/19  Yes Gerlene Fee, NP   Allergies  Allergen Reactions  . Hydrocodone Other (See Comments)    Took away all energy and made her stop eating food for short time  . Percocet [Oxycodone-Acetaminophen] Other (See Comments)    Too away all energy and made her stop eating food for short time  . Eggs Or Egg-Derived Products Hives and Rash  . Mercurial Derivatives Itching and Rash  . Penicillins Hives and Rash    Has patient had a PCN reaction causing immediate rash, facial/tongue/throat swelling, SOB or lightheadedness with hypotension: Yes Has patient had a PCN reaction causing severe rash involving mucus membranes or skin necrosis: No Has patient had a PCN reaction that required hospitalization: No Has patient had a PCN reaction occurring within the last 10 years: Yes If all of the above answers are "NO", then may proceed with Cephalosporin use.  . Quinine Derivatives Hives and Rash   Review of Systems  Physical Exam  Vital Signs: BP 104/81 (BP Location: Left Arm)   Pulse 60   Temp (!) 97.5 F (36.4 C) (Oral)   Resp 20   Ht 5\' 3"  (1.6 m)   Wt 65.8 kg   SpO2 100%   BMI 25.70 kg/m  Pain Scale: 0-10  Pain Score: 0-No pain   SpO2: SpO2: 100 % O2 Device:SpO2: 100 % O2 Flow Rate: .O2 Flow Rate (L/min): 2 L/min  IO: Intake/output summary:   Intake/Output Summary (Last 24 hours) at 06/14/2019 0942 Last data filed at 06/14/2019 0801 Gross per 24 hour  Intake 1080 ml  Output 600 ml  Net 480 ml     LBM: Last BM Date: 06/13/19 Baseline Weight: Weight: 65.9 kg(scale a) Most recent weight: Weight: 65.8 kg     Palliative Assessment/Data:   The above conversation was completed via telephone due to the  restrictions during the COVID-19 pandemic. Thorough chart review and discussion with necessary members of the care team was completed as part of assessment.     Discussed with Dr Neysa Bonito  Time In: 1215 Time Out: 1325 Time Total: 70 minutes Greater than 50%  of this time was spent counseling and coordinating care related to the above assessment and plan.  Signed by: Wadie Lessen, NP   Please contact Palliative Medicine Team phone at [phone] for questions and concerns.  For individual provider: See Shea Evans

## 2019-06-15 ENCOUNTER — Inpatient Hospital Stay (HOSPITAL_COMMUNITY): Payer: Medicare HMO

## 2019-06-15 ENCOUNTER — Telehealth: Payer: Self-pay

## 2019-06-15 LAB — BASIC METABOLIC PANEL
Anion gap: 15 (ref 5–15)
Anion gap: 18 — ABNORMAL HIGH (ref 5–15)
BUN: 32 mg/dL — ABNORMAL HIGH (ref 8–23)
BUN: 33 mg/dL — ABNORMAL HIGH (ref 8–23)
CO2: 17 mmol/L — ABNORMAL LOW (ref 22–32)
CO2: 16 mmol/L — ABNORMAL LOW (ref 22–32)
Calcium: 9 mg/dL (ref 8.9–10.3)
Calcium: 9.1 mg/dL (ref 8.9–10.3)
Chloride: 95 mmol/L — ABNORMAL LOW (ref 98–111)
Chloride: 94 mmol/L — ABNORMAL LOW (ref 98–111)
Creatinine, Ser: 1.75 mg/dL — ABNORMAL HIGH (ref 0.44–1.00)
Creatinine, Ser: 1.84 mg/dL — ABNORMAL HIGH (ref 0.44–1.00)
GFR calc Af Amer: 30 mL/min — ABNORMAL LOW (ref >60)
GFR calc Af Amer: 28 mL/min — ABNORMAL LOW (ref >60)
GFR calc non Af Amer: 26 mL/min — ABNORMAL LOW (ref >60)
GFR calc non Af Amer: 24 mL/min — ABNORMAL LOW (ref >60)
Glucose, Bld: 113 mg/dL — ABNORMAL HIGH (ref 70–99)
Glucose, Bld: 145 mg/dL — ABNORMAL HIGH (ref 70–99)
Potassium: 4.2 mmol/L (ref 3.5–5.1)
Potassium: 4.6 mmol/L (ref 3.5–5.1)
Sodium: 127 mmol/L — ABNORMAL LOW (ref 135–145)
Sodium: 128 mmol/L — ABNORMAL LOW (ref 135–145)

## 2019-06-15 LAB — CBC
HCT: 39.9 % (ref 36.0–46.0)
Hemoglobin: 13.2 g/dL (ref 12.0–15.0)
MCH: 28.6 pg (ref 26.0–34.0)
MCHC: 33.1 g/dL (ref 30.0–36.0)
MCV: 86.6 fL (ref 80.0–100.0)
Platelets: 137 10*3/uL — ABNORMAL LOW (ref 150–400)
RBC: 4.61 MIL/uL (ref 3.87–5.11)
RDW: 17.8 % — ABNORMAL HIGH (ref 11.5–15.5)
WBC: 3.6 10*3/uL — ABNORMAL LOW (ref 4.0–10.5)
nRBC: 0 % (ref 0.0–0.2)

## 2019-06-15 LAB — MAGNESIUM: Magnesium: 2.4 mg/dL (ref 1.7–2.4)

## 2019-06-15 MED ORDER — FUROSEMIDE 10 MG/ML IJ SOLN
40.0000 mg | Freq: Once | INTRAMUSCULAR | Status: AC
  Administered 2019-06-15: 40 mg via INTRAVENOUS
  Filled 2019-06-15: qty 4

## 2019-06-15 MED ORDER — BUMETANIDE 2 MG PO TABS
4.0000 mg | ORAL_TABLET | Freq: Two times a day (BID) | ORAL | Status: DC
  Administered 2019-06-15 – 2019-06-17 (×4): 4 mg via ORAL
  Filled 2019-06-15 (×5): qty 2

## 2019-06-15 NOTE — Progress Notes (Signed)
Physical Therapy Treatment Patient Details Name: Kim Mullins MRN: [id number] DOB: [birth year] Today's Date: 06/15/2019    History of Present Illness 85 yo female was admitted after a stay in SNF for rehab, has now LE edema with hypoxemia a day after dc from the facility.  Has cardiomegaly, pleural effusion, atelectasis.  PMHx:  falls, coffee ground emesis, HTN, DM, asthma on 2L O2, CVA, complete heart block, ICD, CHF with EF 35%, CKD 3, a-fib, tibial fracture, THR, total shoulder arthroscopy    PT Comments    Patient seen for mobility progression. Pt is pleasant and agreeable to participate in therapy. Pt tolerated gait training distance of 30 ft before feeling fatigued. Pt with 2/4 DOE with mobility and pt on 2L O2 via [place] while ambulating. Continue to progress as tolerated.    Follow Up Recommendations  SNF (plan is for home with hospice)     Equipment Recommendations  None recommended by PT    Recommendations for Other Services       Precautions / Restrictions Precautions Precautions: Fall;ICD/Pacemaker Precaution Comments: monitor vitals Restrictions Weight Bearing Restrictions: No    Mobility  Bed Mobility Overal bed mobility: Needs Assistance Bed Mobility: Supine to Sit;Sit to Supine     Supine to sit: Min assist Sit to supine: Min assist   General bed mobility comments: assist to elevate trunk into sitting and to bring bilat LE into bed; cues for sequencing  Transfers Overall transfer level: Needs assistance Equipment used: Rolling walker (2 wheeled) Transfers: Sit to/from Stand Sit to Stand: Min assist;From elevated surface         General transfer comment: assist to power up into standing and cues for safe hand placement  Ambulation/Gait Ambulation/Gait assistance: Min assist Gait Distance (Feet): 30 Feet Assistive device: Rolling walker (2 wheeled) Gait Pattern/deviations: Decreased stride length;Trunk flexed;Step-through pattern Gait  velocity: decreased   General Gait Details: cues for PLB; assist to steady   Stairs             Wheelchair Mobility    Modified Rankin (Stroke Patients Only)       Balance Overall balance assessment: Needs assistance Sitting-balance support: Bilateral upper extremity supported;Feet supported Sitting balance-Leahy Scale: Fair     Standing balance support: Bilateral upper extremity supported;During functional activity Standing balance-Leahy Scale: Poor                              Cognition Arousal/Alertness: Awake/alert Behavior During Therapy: WFL for tasks assessed/performed Overall Cognitive Status: No family/caregiver present to determine baseline cognitive functioning Area of Impairment: Following commands;Problem solving                       Following Commands: Follows one step commands with increased time     Problem Solving: Requires verbal cues General Comments: pt is [facility]       Exercises      General Comments General comments (skin integrity, edema, etc.): 1-2L O2 via [place] to maintain SpO2 90%       Pertinent Vitals/Pain Pain Assessment: No/denies pain    Home Living                      Prior Function            PT Goals (current goals can now be found in the care plan section) Progress towards PT goals: Progressing toward goals  Frequency    Min 3X/week      PT Plan Current plan remains appropriate    Co-evaluation              AM-PAC PT "6 Clicks" Mobility   Outcome Measure  Help needed turning from your back to your side while in a flat bed without using bedrails?: A Little Help needed moving from lying on your back to sitting on the side of a flat bed without using bedrails?: A Little Help needed moving to and from a bed to a chair (including a wheelchair)?: A Little Help needed standing up from a chair using your arms (e.g., wheelchair or bedside chair)?: A Little Help needed to walk  in hospital room?: A Little Help needed climbing 3-5 steps with a railing? : A Lot 6 Click Score: 17    End of Session Equipment Utilized During Treatment: Gait belt;Oxygen Activity Tolerance: Other (comment)(limited by 2/4 DOE) Patient left: in bed;with call bell/phone within reach;with bed alarm set;Other (comment)(bed in chair position) Nurse Communication: Mobility status PT Visit Diagnosis: Unsteadiness on feet (R26.81);Muscle weakness (generalized) (M62.81);History of falling (Z91.81);Difficulty in walking, not elsewhere classified (R26.2);Adult, failure to thrive (R62.7)     Time: 1859-0931 PT Time Calculation (min) (ACUTE ONLY): 30 min  Charges:  $Gait Training: 23-37 mins                     Earney Navy, PTA Acute Rehabilitation Services Pager: [phone] Office: [phone]     Darliss Cheney 06/15/2019, 4:54 PM

## 2019-06-15 NOTE — Progress Notes (Signed)
[facility] PROGRESS NOTE  Kim Mullins HGD:[id number] DOB: [birth year] DOA: 06/11/2019 PCP: Lin Landsman, MD  Assessment/Plan:  Acute on chronic systolic congestive heart failure.  Patient volume overloaded today with 3-4+ pitting edema in lower extremities as well as rales on exam and new onset dyspnea today at bedside.  Chest x-ray obtained which showed concern for volume overload.  She was satting well on 1 L nasal cannula and otherwise hemodynamically stable. Palliative meeting 10/15: Patient is to remain full code but will transition to home with home hospice with goals for comfort -Accurate intake output -Weigh patient daily -Lasix given this morning with improvement in symptoms afternoon  Acute on chronic renal failure stage III stable -hold nephrotoxins as able -monitor urine output -Monitor creatinine while diuresing patient -Hold Bumex, consider gentle IV fluids  -Restart Bumex milligrams p.o. twice daily, dose this evening  Hyponatremia. Mild. Somewhat chronic. Sodium 130 on admission, currently 127, likely volume overloaded -Diuresis as above  A. fib, chronic, rate controlled. chadvasc score 7. Home meds include Eliquis.  -Continue with Eliquis  HTN. Fair control.  Holding antihypertensives due to soft blood pressures and giving small fluid bolus -monitor BP  Chronic respiratory failure with hypoxia. Home oxygen. Chest xray with small left pleural effusion basilar atelectasis. -see above  Allergies Continue with Claritin  Code Status: full Disposition Plan:  Plan for home with hospice   Consultants:  Palliative  HPI/Subjective: Patient was seen and examined sitting upright in chair today.  She was complaining of worsening shortness of breath which started 20 minutes prior to arrival.  Denies chest pain or palpitations.  Vital signs obtained at bedside which were stable.    Objective: Vitals:   06/15/19 0038 06/15/19 0410  BP: 96/75  110/78  Pulse: (!) 59 61  Resp: 18 18  Temp: (!) 97.5 F (36.4 C) 97.6 F (36.4 C)  SpO2: 97% 100%    Intake/Output Summary (Last 24 hours) at 06/15/2019 0733 Last data filed at 06/15/2019 9622 Gross per 24 hour  Intake 1090 ml  Output 300 ml  Net 790 ml   Filed Weights   06/13/19 0441 06/14/19 0100 06/15/19 0412  Weight: 65.5 kg 65.8 kg 68.9 kg    Exam:   General:  Thin frail in some respiratory discomfort  Cardiovascular: rrr +murmur 3+pitting edema  Respiratory: Unaware of conversational dyspnea.  Left basilar rales  Abdomen: obese soft +BS no guarding or rebounding   Musculoskeletal: Plus lower extremity pitting edema bilaterally  Data Reviewed: Basic Metabolic Panel: Recent Labs  Lab 06/11/19 1327 06/13/19 0520 06/14/19 0655 06/15/19 0416  NA 130* 129* 127* 127*  K 4.1 5.0 4.3 4.2  CL 95* 95* 93* 95*  CO2 22 20* 17* 17*  GLUCOSE 126* 113* 123* 113*  BUN 32* 33* 33* 32*  CREATININE 1.92* 1.74* 1.70* 1.75*  CALCIUM 8.9 8.9 9.0 9.0   Liver Function Tests: No results for input(s): AST, ALT, ALKPHOS, BILITOT, PROT, ALBUMIN in the last 168 hours. No results for input(s): LIPASE, AMYLASE in the last 168 hours. No results for input(s): AMMONIA in the last 168 hours. CBC: Recent Labs  Lab 06/11/19 1327 06/13/19 0520 06/14/19 0655 06/15/19 0416  WBC 2.7* 4.4 3.4* 3.6*  HGB 13.8 13.2 13.2 13.2  HCT 43.0 38.9 39.4 39.9  MCV 91.1 87.6 87.0 86.6  PLT 147* PLATELET CLUMPS NOTED ON SMEAR, COUNT APPEARS DECREASED 131* 137*   Cardiac Enzymes: No results for input(s): CKTOTAL, CKMB, CKMBINDEX, TROPONINI in the last 168  hours. BNP (last 3 results) Recent Labs    10/09/18 1238 05/15/19 0516 06/11/19 1327  BNP 1,424.7* 1,217.6* 1,237.5*    ProBNP (last 3 results) No results for input(s): PROBNP in the last 8760 hours.  CBG: Recent Labs  Lab 06/12/19 1127  GLUCAP 169*    Recent Results (from the past 240 hour(s))  SARS CORONAVIRUS 2 (TAT 6-24  HRS) Nasopharyngeal Nasopharyngeal Swab     Status: None   Collection Time: 06/11/19  4:22 PM   Specimen: Nasopharyngeal Swab  Result Value Ref Range Status   SARS Coronavirus 2 NEGATIVE NEGATIVE Final    Comment: (NOTE) SARS-CoV-2 target nucleic acids are NOT DETECTED. The SARS-CoV-2 RNA is generally detectable in upper and lower respiratory specimens during the acute phase of infection. Negative results do not preclude SARS-CoV-2 infection, do not rule out co-infections with other pathogens, and should not be used as the sole basis for treatment or other patient management decisions. Negative results must be combined with clinical observations, patient history, and epidemiological information. The expected result is Negative. Fact Sheet for Patients: SugarRoll.be Fact Sheet for Healthcare Providers: https://www.woods-mathews.com/ This test is not yet approved or cleared by the [place] FDA and  has been authorized for detection and/or diagnosis of SARS-CoV-2 by FDA under an Emergency Use Authorization (EUA). This EUA will remain  in effect (meaning this test can be used) for the duration of the COVID-19 declaration under Section 56 4(b)(1) of the Act, 21 U.S.C. section 360bbb-3(b)(1), unless the authorization is terminated or revoked sooner. Performed at [facility], [place] [address], [place], [place] [address]   MRSA PCR Screening     Status: Abnormal   Collection Time: 06/13/19 11:28 AM   Specimen: Nasal Mucosa; Nasopharyngeal  Result Value Ref Range Status   MRSA by PCR POSITIVE (A) NEGATIVE Final    Comment:        The GeneXpert MRSA Assay (FDA approved for NASAL specimens only), is one component of a comprehensive MRSA colonization surveillance program. It is not intended to diagnose MRSA infection nor to guide or monitor treatment for MRSA infections. RESULT CALLED TO, READ BACK BY AND VERIFIED WITH: RN D [facility]  [id number] AT 1511 BY CM Performed at [facility], [place] [address], [place], [place] [address]      Studies: No results found.  Scheduled Meds: . apixaban  2.5 mg Oral BID  . Chlorhexidine Gluconate Cloth  6 each Topical Q0600  . docusate sodium  100 mg Oral Daily  . loratadine  10 mg Oral Daily  . mupirocin ointment  1 application Nasal BID  . pantoprazole  40 mg Oral Daily  . polyethylene glycol  17 g Oral Daily  . senna  1 tablet Oral QHS   Continuous Infusions:  Principal Problem:   Acute on chronic heart failure (HCC) Active Problems:   Dyspnea   Benign essential HTN   OSA (obstructive sleep apnea)   Pacemaker   Occipital infarction (HCC)   Atrial fibrillation (HCC)   CKD (chronic kidney disease)   DNR (do not resuscitate) discussion   Hyponatremia   Chronic respiratory failure with hypoxia ([place])   Weakness generalized    Time spent: 25 minutes    Harold Hedge DO  [facility]  If 7PM-7AM, please contact night-coverage at [email or web address], password [facility] 06/15/2019, 7:33 AM  LOS: 3 days

## 2019-06-15 NOTE — Progress Notes (Signed)
[place]  Received referral 10/15 from [employer]. Chart reviewed and eligibility confirmed by hospice physician. Spoke with g-dtr by phone to confirm interest, address and no DME needs at this time.   Please send patient home with scripts for new medications.   [facility] Specialist will contact g-dtr to schedule first visit in home.  Please notify [facility] liaison listed on AMION or after hours service at [phone] when patient is ready to leave unit.   Thank you,  Erling Conte, LCSW [phone]

## 2019-06-15 NOTE — Plan of Care (Signed)
°  Problem: Education: °Goal: Ability to demonstrate management of disease process will improve °Outcome: Progressing °Goal: Ability to verbalize understanding of medication therapies will improve °Outcome: Progressing °Goal: Individualized Educational Video(s) °Outcome: Progressing °  °

## 2019-06-15 NOTE — Telephone Encounter (Signed)
  Spoke with pt at the hospital state that her grand daughter will call our office to schedule her a new pt appointment with Dr Volanda Napoleon when she gets discharged from the hospital        Copied from [place] [phone]. Topic: Appointment Scheduling - Scheduling Inquiry for Clinic >> Jun 13, 2019  2:08 PM Sheran Luz wrote: Patient's granddaughter calling to cancel and reschedule new patient appointment. Patient is currently in hospital. Patient's granddaughter is requesting to be worked in for new patient appointment, as the next available is not until December. >> Jun 13, 2019  3:12 PM Cox, Melburn Hake, CMA wrote: Can this appt be virtual? Please advise.

## 2019-06-16 DIAGNOSIS — E871 Hypo-osmolality and hyponatremia: Secondary | ICD-10-CM

## 2019-06-16 LAB — BASIC METABOLIC PANEL
Anion gap: 18 — ABNORMAL HIGH (ref 5–15)
BUN: 30 mg/dL — ABNORMAL HIGH (ref 8–23)
CO2: 15 mmol/L — ABNORMAL LOW (ref 22–32)
Calcium: 8.7 mg/dL — ABNORMAL LOW (ref 8.9–10.3)
Chloride: 95 mmol/L — ABNORMAL LOW (ref 98–111)
Creatinine, Ser: 1.76 mg/dL — ABNORMAL HIGH (ref 0.44–1.00)
GFR calc Af Amer: 30 mL/min — ABNORMAL LOW (ref >60)
GFR calc non Af Amer: 26 mL/min — ABNORMAL LOW (ref >60)
Glucose, Bld: 136 mg/dL — ABNORMAL HIGH (ref 70–99)
Potassium: 5.2 mmol/L — ABNORMAL HIGH (ref 3.5–5.1)
Sodium: 128 mmol/L — ABNORMAL LOW (ref 135–145)

## 2019-06-16 LAB — CBC
HCT: 42.5 % (ref 36.0–46.0)
Hemoglobin: 14 g/dL (ref 12.0–15.0)
MCH: 29.4 pg (ref 26.0–34.0)
MCHC: 32.9 g/dL (ref 30.0–36.0)
MCV: 89.1 fL (ref 80.0–100.0)
Platelets: 112 10*3/uL — ABNORMAL LOW (ref 150–400)
RBC: 4.77 MIL/uL (ref 3.87–5.11)
RDW: 18.2 % — ABNORMAL HIGH (ref 11.5–15.5)
WBC: 4.8 10*3/uL (ref 4.0–10.5)
nRBC: 0 % (ref 0.0–0.2)

## 2019-06-16 LAB — MAGNESIUM: Magnesium: 2.3 mg/dL (ref 1.7–2.4)

## 2019-06-16 MED ORDER — CHLORHEXIDINE GLUCONATE CLOTH 2 % EX PADS: 6.0000 | MEDICATED_PAD | Freq: Every morning | CUTANEOUS | Status: DC

## 2019-06-16 NOTE — Progress Notes (Signed)
[facility] PROGRESS NOTE  Kim Mullins OQH:[id number] DOB: [birth year] DOA: 06/11/2019 PCP: Lin Landsman, MD  Assessment/Plan:  Acute on chronic systolic congestive heart failure.  Patient volume overloaded today with 2+ pitting edema in lower extremities with improved weight, 68.9 kg-> 66.6 kg today. Palliative meeting 10/15: Patient is to remain full code but will transition to home with home hospice. -Continue home Bumex which was restarted last night -Continue I/O and daily weight -I had a long discussion with patient's granddaughter about goals of care today as the patient's granddaughter wishes for patient to continue full code and be discharged on all necessary home medications as opposed to comfort measures but is agreeable to hospice at home.  I discussed this with palliative care who mentioned that hospice services are to visit the family this coming Monday and can continue to reevaluate goals of care at home and medication need.  Granddaughter who is to take care of the patient at home is currently out of town but will be back in town tomorrow for potential discharge.  Acute on chronic renal failure stage III  -Follow-up today's labs  Hyponatremia.  Improved yesterday with diuresis -Follow-up today's labs  A. fib, chronic, rate controlled. chadvasc score 7. Home meds include Eliquis.  -Continue with Eliquis  HTN. Fair control.  Holding antihypertensives due to soft blood pressures and giving small fluid bolus -monitor BP  Chronic respiratory failure with hypoxia.  On home oxygen. Chest xray with small left pleural effusion basilar atelectasis. -see above  Allergies Continue with Claritin Family communication: Long discussion with patient's granddaughter today as above Code Status: full Disposition Plan:  Plan for discharge home with hospice [place]   Consultants:  Palliative  HPI/Subjective: Patient seen and examined today laying in bed in no acute  distress and resting comfortably.  She states that she feels better today than yesterday she is tolerating her diet denies any complaints at this time   Objective: Vitals:   06/16/19 0808 06/16/19 1212  BP: 110/84 105/83  Pulse: (!) 58 (!) 59  Resp:  18  Temp:  (!) 97.5 F (36.4 C)  SpO2: 99%     Intake/Output Summary (Last 24 hours) at 06/16/2019 1443 Last data filed at 06/16/2019 1200 Gross per 24 hour  Intake 682 ml  Output 1000 ml  Net -318 ml   Filed Weights   06/14/19 0100 06/15/19 0412 06/16/19 0639  Weight: 65.8 kg 68.9 kg 66.6 kg    Exam:   General:  Thin frail in mild respiratory discomfort  Cardiovascular: rrr +murmur  Respiratory: Unaware of conversational dyspnea.    Abdomen: obese soft +BS no guarding or rebounding   Musculoskeletal: 2+lower extremity pitting edema bilaterally.  Full ROM  Data Reviewed: Basic Metabolic Panel: Recent Labs  Lab 06/11/19 1327 06/13/19 0520 06/14/19 0655 06/15/19 0416 06/15/19 1353  NA 130* 129* 127* 127* 128*  K 4.1 5.0 4.3 4.2 4.6  CL 95* 95* 93* 95* 94*  CO2 22 20* 17* 17* 16*  GLUCOSE 126* 113* 123* 113* 145*  BUN 32* 33* 33* 32* 33*  CREATININE 1.92* 1.74* 1.70* 1.75* 1.84*  CALCIUM 8.9 8.9 9.0 9.0 9.1  MG  --   --   --   --  2.4   Liver Function Tests: No results for input(s): AST, ALT, ALKPHOS, BILITOT, PROT, ALBUMIN in the last 168 hours. No results for input(s): LIPASE, AMYLASE in the last 168 hours. No results for input(s): AMMONIA in the last 168 hours. CBC:  Recent Labs  Lab 06/11/19 1327 06/13/19 0520 06/14/19 0655 06/15/19 0416  WBC 2.7* 4.4 3.4* 3.6*  HGB 13.8 13.2 13.2 13.2  HCT 43.0 38.9 39.4 39.9  MCV 91.1 87.6 87.0 86.6  PLT 147* PLATELET CLUMPS NOTED ON SMEAR, COUNT APPEARS DECREASED 131* 137*   Cardiac Enzymes: No results for input(s): CKTOTAL, CKMB, CKMBINDEX, TROPONINI in the last 168 hours. BNP (last 3 results) Recent Labs    10/09/18 1238 05/15/19 0516 06/11/19 1327   BNP 1,424.7* 1,217.6* 1,237.5*    ProBNP (last 3 results) No results for input(s): PROBNP in the last 8760 hours.  CBG: Recent Labs  Lab 06/12/19 1127  GLUCAP 169*    Recent Results (from the past 240 hour(s))  SARS CORONAVIRUS 2 (TAT 6-24 HRS) Nasopharyngeal Nasopharyngeal Swab     Status: None   Collection Time: 06/11/19  4:22 PM   Specimen: Nasopharyngeal Swab  Result Value Ref Range Status   SARS Coronavirus 2 NEGATIVE NEGATIVE Final    Comment: (NOTE) SARS-CoV-2 target nucleic acids are NOT DETECTED. The SARS-CoV-2 RNA is generally detectable in upper and lower respiratory specimens during the acute phase of infection. Negative results do not preclude SARS-CoV-2 infection, do not rule out co-infections with other pathogens, and should not be used as the sole basis for treatment or other patient management decisions. Negative results must be combined with clinical observations, patient history, and epidemiological information. The expected result is Negative. Fact Sheet for Patients: SugarRoll.be Fact Sheet for Healthcare Providers: https://www.woods-mathews.com/ This test is not yet approved or cleared by the [place] FDA and  has been authorized for detection and/or diagnosis of SARS-CoV-2 by FDA under an Emergency Use Authorization (EUA). This EUA will remain  in effect (meaning this test can be used) for the duration of the COVID-19 declaration under Section 56 4(b)(1) of the Act, 21 U.S.C. section 360bbb-3(b)(1), unless the authorization is terminated or revoked sooner. Performed at [facility], [place] [address], [place], [place] [address]   MRSA PCR Screening     Status: Abnormal   Collection Time: 06/13/19 11:28 AM   Specimen: Nasal Mucosa; Nasopharyngeal  Result Value Ref Range Status   MRSA by PCR POSITIVE (A) NEGATIVE Final    Comment:        The GeneXpert MRSA Assay (FDA approved for NASAL  specimens only), is one component of a comprehensive MRSA colonization surveillance program. It is not intended to diagnose MRSA infection nor to guide or monitor treatment for MRSA infections. RESULT CALLED TO, READ BACK BY AND VERIFIED WITH: RN D [facility] [id number] AT 1511 BY CM Performed at [facility], [place] [address], [place], [place] [address]      Studies: Dg Chest Port 1 View  Result Date: 06/15/2019 CLINICAL DATA:  Shortness of breath EXAM: PORTABLE CHEST 1 VIEW COMPARISON:  06/11/2019 FINDINGS: Left pacer remains in place, unchanged. Cardiomegaly, vascular congestion. Low lung volumes with bibasilar atelectasis. No overt edema or effusions. No acute bony abnormality. IMPRESSION: Cardiomegaly with vascular congestion. Low volumes with bibasilar atelectasis. Electronically Signed   By: Rolm Baptise M.D.   On: 06/15/2019 12:06    Scheduled Meds: . apixaban  2.5 mg Oral BID  . bumetanide  4 mg Oral BID  . Chlorhexidine Gluconate Cloth  6 each Topical q morning - 10a  . docusate sodium  100 mg Oral Daily  . loratadine  10 mg Oral Daily  . mupirocin ointment  1 application Nasal BID  . pantoprazole  40 mg Oral Daily  . polyethylene glycol  17 g Oral Daily  . senna  1 tablet Oral QHS   Continuous Infusions:  Principal Problem:   Acute on chronic heart failure (HCC) Active Problems:   Dyspnea   Benign essential HTN   OSA (obstructive sleep apnea)   Pacemaker   Occipital infarction (HCC)   Atrial fibrillation (HCC)   CKD (chronic kidney disease)   DNR (do not resuscitate) discussion   Hyponatremia   Chronic respiratory failure with hypoxia ([place])   Weakness generalized    Time spent: 35 minutes    [facility]  If 7PM-7AM, please contact night-coverage at [email or web address], password [facility] 06/16/2019, 2:43 PM  LOS: 4 days

## 2019-06-17 DIAGNOSIS — Z7189 Other specified counseling: Secondary | ICD-10-CM

## 2019-06-17 LAB — CBC
HCT: 40.2 % (ref 36.0–46.0)
Hemoglobin: 13.5 g/dL (ref 12.0–15.0)
MCH: 29.5 pg (ref 26.0–34.0)
MCHC: 33.6 g/dL (ref 30.0–36.0)
MCV: 87.8 fL (ref 80.0–100.0)
Platelets: 115 10*3/uL — ABNORMAL LOW (ref 150–400)
RBC: 4.58 MIL/uL (ref 3.87–5.11)
RDW: 18.2 % — ABNORMAL HIGH (ref 11.5–15.5)
WBC: 3.6 10*3/uL — ABNORMAL LOW (ref 4.0–10.5)
nRBC: 0 % (ref 0.0–0.2)

## 2019-06-17 LAB — BASIC METABOLIC PANEL
Anion gap: 14 (ref 5–15)
BUN: 30 mg/dL — ABNORMAL HIGH (ref 8–23)
CO2: 20 mmol/L — ABNORMAL LOW (ref 22–32)
Calcium: 8.6 mg/dL — ABNORMAL LOW (ref 8.9–10.3)
Chloride: 96 mmol/L — ABNORMAL LOW (ref 98–111)
Creatinine, Ser: 1.66 mg/dL — ABNORMAL HIGH (ref 0.44–1.00)
GFR calc Af Amer: 32 mL/min — ABNORMAL LOW (ref >60)
GFR calc non Af Amer: 28 mL/min — ABNORMAL LOW (ref >60)
Glucose, Bld: 105 mg/dL — ABNORMAL HIGH (ref 70–99)
Potassium: 4.3 mmol/L (ref 3.5–5.1)
Sodium: 130 mmol/L — ABNORMAL LOW (ref 135–145)

## 2019-06-17 LAB — GLUCOSE, CAPILLARY: Glucose-Capillary: 214 mg/dL — ABNORMAL HIGH (ref 70–99)

## 2019-06-17 NOTE — Progress Notes (Signed)
Patient currently without IV access, as it was removed/taken out in the night. MD states ok for now to leave without IV access, pending lab results.

## 2019-06-17 NOTE — Discharge Summary (Signed)
Physician Discharge Summary  Kim Mullins HER:[id number] DOB: December 12, 1929 DOA: 06/11/2019  PCP: Lin Landsman, MD  Admit date: 06/11/2019 Discharge date: 06/17/2019  Admitted From: Skilled rehab facility Discharged to: Home with hospice Equipment/Devices: Compression stockings Discharge Condition: Stable  Recommendations for Outpatient Follow-up   1. Follow up with PCP in 1-2 weeks 2. Please follow up BMP/CBC 3. Continue to discuss/reevaluate goals of care  Medication Adjustments at Discharge  1. None   Hospital Summary  This is an 85 year old female with past medical history of hypertension, systolic CHF on Bumex with EF 35 to 40% on Bumex, atrial fibrillation on Eliquis, stroke, GERD, status post pacemaker, OSA, chronic hypoxic respiratory failure O2 discharged from SNF on 10/11 but was noted to have worsening swelling by her daughter who was recently visiting from [place] and was noted to have small left pleural effusion with bibasilar atelectasis and cardiomegaly with hyponatremia, elevated BNP 1237 admitted on 10/12 for acute on chronic systolic heart failure.  She was diuresed with IV Lasix.   She was noted to have hyponatremia slightly below baseline as well as AKI which was thought to be due to heart failure.  And improved with diuresis.  Hospitalization was complicated by acutely worsened volume overload on 10/15 which improved with IV Lasix.  Patient was able to switch to her home Bumex 4 mg twice daily p.o. on 10/16 with improvement in volume status, hyponatremia to baseline and renal function higher to discharge.   While hospitalized, she was evaluated by palliative care team, whom she was familiar with at prior hospitalization, and plan was for patient to be discharged when medically stable to home with hospice however patient and granddaughter wished to remain full code during hospitalization and continue with treatment plan.  Plan is to have palliative team follow-up  with patient on Monday 10/19 at home.  Therefore, she was discharged with her home medications.  She had an episode of asymptomatic NSVT for 19 beats morning of 10/18 and return to her baseline paced rhythm.  At discharge, patient was noted to continue to have 2+ bilateral lower extremity edema which has improved over the past 2 days while on her home oral medications.  This was discussed with granddaughter over the phone.  She was deemed medically stable for discharge on home medications with instruction to keep legs at heart level and wear compression stockings while at home as tolerated with lab work in 1 week.  Principal Problem:   Acute on chronic heart failure (HCC) Active Problems:   Dyspnea   Benign essential HTN   OSA (obstructive sleep apnea)   Pacemaker   Occipital infarction [facility])   Atrial fibrillation (HCC)   CKD (chronic kidney disease)   DNR (do not resuscitate) discussion   Hyponatremia   Chronic respiratory failure with hypoxia ([place])   Weakness generalized    Code Status: Full Code Diet recommendation: Heart healthy for now  Consultants  . Palliative  Procedures  . None   Subjective  Patient seen and examined at bedside no acute distress and resting comfortably.  No events overnight.  Tolerating diet. In good spirits and anticipating discharge.  This a.m. patient had 19 beats NSVT.  Patient ports she was asymptomatic during this time  Denies any chest pain, shortness of breath, fever, nausea, vomiting, urinary or bowel complaints. Otherwise ROS negative   Objective   Discharge Exam: Vitals:   06/17/19 0616 06/17/19 1218  BP: 107/78 99/78  Pulse: 62 (!) 59  Resp: 16  18  Temp: (!) 97.5 F (36.4 C) (!) 97.3 F (36.3 C)  SpO2: 100%    Vitals:   06/16/19 2000 06/16/19 2100 06/17/19 0616 06/17/19 1218  BP:  109/81 107/78 99/78  Pulse:  (!) 59 62 (!) 59  Resp: 20 18 16 18   Temp:  (!) 97.5 F (36.4 C) (!) 97.5 F (36.4 C) (!) 97.3 F (36.3 C)   TempSrc:  Oral Oral   SpO2:  100% 100%   Weight:   66.2 kg   Height:        Physical Exam Vitals signs and nursing note reviewed.  Constitutional:      Appearance: Normal appearance.     Comments: Nasal cannula  HENT:     Head: Normocephalic and atraumatic.     Nose: Nose normal.     Mouth/Throat:     Mouth: Mucous membranes are moist.  Eyes:     Extraocular Movements: Extraocular movements intact.  Neck:     Musculoskeletal: Normal range of motion.  Cardiovascular:     Rate and Rhythm: Normal rate and regular rhythm.     Heart sounds: No murmur.  Pulmonary:     Effort: Pulmonary effort is normal.     Breath sounds: Normal breath sounds. No rales.  Abdominal:     General: Abdomen is flat.     Palpations: Abdomen is soft.  Musculoskeletal: Normal range of motion.     Comments: 2+ bilateral lower extremity edema to ankles  Neurological:     Mental Status: She is alert. Mental status is at baseline.  Psychiatric:        Mood and Affect: Mood normal.        Behavior: Behavior normal.        Thought Content: Thought content normal.        Judgment: Judgment normal.       The results of significant diagnostics from this hospitalization (including imaging, microbiology, ancillary and laboratory) are listed below for reference.     Microbiology: Recent Results (from the past 240 hour(s))  SARS CORONAVIRUS 2 (TAT 6-24 HRS) Nasopharyngeal Nasopharyngeal Swab     Status: None   Collection Time: 06/11/19  4:22 PM   Specimen: Nasopharyngeal Swab  Result Value Ref Range Status   SARS Coronavirus 2 NEGATIVE NEGATIVE Final    Comment: (NOTE) SARS-CoV-2 target nucleic acids are NOT DETECTED. The SARS-CoV-2 RNA is generally detectable in upper and lower respiratory specimens during the acute phase of infection. Negative results do not preclude SARS-CoV-2 infection, do not rule out co-infections with other pathogens, and should not be used as the sole basis for treatment or  other patient management decisions. Negative results must be combined with clinical observations, patient history, and epidemiological information. The expected result is Negative. Fact Sheet for Patients: SugarRoll.be Fact Sheet for Healthcare Providers: https://www.woods-mathews.com/ This test is not yet approved or cleared by the [place] FDA and  has been authorized for detection and/or diagnosis of SARS-CoV-2 by FDA under an Emergency Use Authorization (EUA). This EUA will remain  in effect (meaning this test can be used) for the duration of the COVID-19 declaration under Section 56 4(b)(1) of the Act, 21 U.S.C. section 360bbb-3(b)(1), unless the authorization is terminated or revoked sooner. Performed at [facility], [place] [address] [place], [place] [address]   MRSA PCR Screening     Status: Abnormal   Collection Time: 06/13/19 11:28 AM   Specimen: Nasal Mucosa; Nasopharyngeal  Result Value Ref  Range Status   MRSA by PCR POSITIVE (A) NEGATIVE Final    Comment:        The GeneXpert MRSA Assay (FDA approved for NASAL specimens only), is one component of a comprehensive MRSA colonization surveillance program. It is not intended to diagnose MRSA infection nor to guide or monitor treatment for MRSA infections. RESULT CALLED TO, READ BACK BY AND VERIFIED WITH: RN D [facility] [id number] AT 1511 BY CM Performed at [facility], [place] [address] [place], [place] [address]      Labs: BNP (last 3 results) Recent Labs    10/09/18 1238 05/15/19 0516 06/11/19 1327  BNP 1,424.7* 1,217.6* 5,053.9*   Basic Metabolic Panel: Recent Labs  Lab 06/14/19 0655 06/15/19 0416 06/15/19 1353 06/16/19 1608 06/17/19 0959  NA 127* 127* 128* 128* 130*  K 4.3 4.2 4.6 5.2* 4.3  CL 93* 95* 94* 95* 96*  CO2 17* 17* 16* 15* 20*  GLUCOSE 123* 113* 145* 136* 105*  BUN 33* 32* 33* 30* 30*  CREATININE 1.70* 1.75* 1.84* 1.76* 1.66*  CALCIUM  9.0 9.0 9.1 8.7* 8.6*  MG  --   --  2.4 2.3  --    Liver Function Tests: No results for input(s): AST, ALT, ALKPHOS, BILITOT, PROT, ALBUMIN in the last 168 hours. No results for input(s): LIPASE, AMYLASE in the last 168 hours. No results for input(s): AMMONIA in the last 168 hours. CBC: Recent Labs  Lab 06/13/19 0520 06/14/19 0655 06/15/19 0416 06/16/19 1608 06/17/19 0959  WBC 4.4 3.4* 3.6* 4.8 3.6*  HGB 13.2 13.2 13.2 14.0 13.5  HCT 38.9 39.4 39.9 42.5 40.2  MCV 87.6 87.0 86.6 89.1 87.8  PLT PLATELET CLUMPS NOTED ON SMEAR, COUNT APPEARS DECREASED 131* 137* 112* 115*   Cardiac Enzymes: No results for input(s): CKTOTAL, CKMB, CKMBINDEX, TROPONINI in the last 168 hours. BNP: Invalid input(s): POCBNP CBG: Recent Labs  Lab 06/12/19 1127 06/17/19 0724  GLUCAP 169* 214*   D-Dimer No results for input(s): DDIMER in the last 72 hours. Hgb A1c No results for input(s): HGBA1C in the last 72 hours. Lipid Profile No results for input(s): CHOL, HDL, LDLCALC, TRIG, CHOLHDL, LDLDIRECT in the last 72 hours. Thyroid function studies No results for input(s): TSH, T4TOTAL, T3FREE, THYROIDAB in the last 72 hours.  Invalid input(s): FREET3 Anemia work up No results for input(s): VITAMINB12, FOLATE, FERRITIN, TIBC, IRON, RETICCTPCT in the last 72 hours. Urinalysis    Component Value Date/Time   COLORURINE YELLOW 05/14/2019 2105   APPEARANCEUR CLEAR 05/14/2019 2105   LABSPEC 1.010 05/14/2019 2105   PHURINE 8.0 05/14/2019 2105   GLUCOSEU NEGATIVE 05/14/2019 2105   GLUCOSEU NEGATIVE 07/10/2014 1517   HGBUR NEGATIVE 05/14/2019 2105   BILIRUBINUR NEGATIVE 05/14/2019 2105   KETONESUR NEGATIVE 05/14/2019 2105   PROTEINUR NEGATIVE 05/14/2019 2105   UROBILINOGEN 0.2 10/06/2014 1552   NITRITE NEGATIVE 05/14/2019 2105   LEUKOCYTESUR SMALL (A) 05/14/2019 2105   Sepsis Labs Invalid input(s): PROCALCITONIN,  WBC,  LACTICIDVEN Microbiology Recent Results (from the past 240 hour(s))  SARS  CORONAVIRUS 2 (TAT 6-24 HRS) Nasopharyngeal Nasopharyngeal Swab     Status: None   Collection Time: 06/11/19  4:22 PM   Specimen: Nasopharyngeal Swab  Result Value Ref Range Status   SARS Coronavirus 2 NEGATIVE NEGATIVE Final    Comment: (NOTE) SARS-CoV-2 target nucleic acids are NOT DETECTED. The SARS-CoV-2 RNA is generally detectable in upper and lower respiratory specimens during the acute phase of infection. Negative results do not preclude  SARS-CoV-2 infection, do not rule out co-infections with other pathogens, and should not be used as the sole basis for treatment or other patient management decisions. Negative results must be combined with clinical observations, patient history, and epidemiological information. The expected result is Negative. Fact Sheet for Patients: SugarRoll.be Fact Sheet for Healthcare Providers: https://www.woods-mathews.com/ This test is not yet approved or cleared by the [place] FDA and  has been authorized for detection and/or diagnosis of SARS-CoV-2 by FDA under an Emergency Use Authorization (EUA). This EUA will remain  in effect (meaning this test can be used) for the duration of the COVID-19 declaration under Section 56 4(b)(1) of the Act, 21 U.S.C. section 360bbb-3(b)(1), unless the authorization is terminated or revoked sooner. Performed at [facility], [place] [address], [place], [place] [address]   MRSA PCR Screening     Status: Abnormal   Collection Time: 06/13/19 11:28 AM   Specimen: Nasal Mucosa; Nasopharyngeal  Result Value Ref Range Status   MRSA by PCR POSITIVE (A) NEGATIVE Final    Comment:        The GeneXpert MRSA Assay (FDA approved for NASAL specimens only), is one component of a comprehensive MRSA colonization surveillance program. It is not intended to diagnose MRSA infection nor to guide or monitor treatment for MRSA infections. RESULT CALLED TO, READ BACK BY AND  VERIFIED WITH: RN D [facility] [id number] AT 1511 BY CM Performed at [facility], [place] [address] [place], [place] [address]     Discharge Instructions    Discharge Instructions    Compression stockings   Complete by: As directed    Diet - low sodium heart healthy   Complete by: As directed    Discharge instructions   Complete by: As directed    You were seen and evaluated in the hospital evaluation of your shortness of breath and heart failure.  You were here you received medications to reduce volume and had improvement in your symptoms.  You are also evaluated by palliative care with plans to be discharged home with hospice.  Hospice services to come up to your home on day, 10/19, for further support treatment.  You were discharged on all your home meds as well as compression stockings and with lab work to be done in 1 week. It was a pleasure taking care of you!  If you have any significant change or worsening symptoms contact your PCP/hospice coordinator.   Increase activity slowly   Complete by: As directed      Allergies as of 06/17/2019      Reactions   Hydrocodone Other (See Comments)   Took away all energy and made her stop eating food for short time   Percocet [oxycodone-acetaminophen] Other (See Comments)   Too away all energy and made her stop eating food for short time   Eggs Or Egg-derived Products Hives, Rash   Mercurial Derivatives Itching, Rash   Penicillins Hives, Rash   Has patient had a PCN reaction causing immediate rash, facial/tongue/throat swelling, SOB or lightheadedness with hypotension: Yes Has patient had a PCN reaction causing severe rash involving mucus membranes or skin necrosis: No Has patient had a PCN reaction that required hospitalization: No Has patient had a PCN reaction occurring within the last 10 years: Yes If all of the above answers are "NO", then may proceed with Cephalosporin use.   Quinine Derivatives Hives, Rash      Medication List     TAKE these medications  apixaban 2.5 MG Tabs tablet Commonly known as: Eliquis Take 1 tablet (2.5 mg total) by mouth 2 (two) times daily.   bumetanide 2 MG tablet Commonly known as: BUMEX Take 2 tablets (4 mg total) by mouth 2 (two) times daily.   diclofenac sodium 1 % Gel Commonly known as: VOLTAREN Apply 2 g topically 3 (three) times daily as needed (pain, inflammation).   fexofenadine 180 MG tablet Commonly known as: ALLEGRA Take 1 tablet (180 mg total) by mouth daily.   glucose blood test strip Use to test blood sugar twice daily ([employer] Smartview)   hydrocortisone cream 1 % Apply topically daily as needed for itching. Cream; amt: sparingly; topical Special Instructions: apply to itchy skin Once A Day - PRN PRN 1   latanoprost 0.005 % ophthalmic solution Commonly known as: XALATAN Place 1 drop into both eyes at bedtime.   OXYGEN Inhale 2 L into the lungs continuous.   pantoprazole 40 MG tablet Commonly known as: PROTONIX Take 1 tablet (40 mg total) by mouth daily.   polyethylene glycol 17 g packet Commonly known as: MIRALAX / GLYCOLAX Take 17 g by mouth daily.   senna 8.6 MG Tabs tablet Commonly known as: SENOKOT Take 1 tablet (8.6 mg total) by mouth at bedtime.   timolol 0.5 % ophthalmic solution Commonly known as: TIMOPTIC Place 1 drop into both eyes at bedtime.   Tylenol 325 MG Caps Generic drug: Acetaminophen Take 650 mg by mouth daily as needed (for shoulder pain).      Follow-up Information    [facility] Follow up.   Why: home hospice Contact information: [place] [phone]         Allergies  Allergen Reactions  . Hydrocodone Other (See Comments)    Took away all energy and made her stop eating food for short time  . Percocet [Oxycodone-Acetaminophen] Other (See Comments)    Too away all energy and made her stop eating food for short time  . Eggs Or Egg-Derived Products Hives and  Rash  . Mercurial Derivatives Itching and Rash  . Penicillins Hives and Rash    Has patient had a PCN reaction causing immediate rash, facial/tongue/throat swelling, SOB or lightheadedness with hypotension: Yes Has patient had a PCN reaction causing severe rash involving mucus membranes or skin necrosis: No Has patient had a PCN reaction that required hospitalization: No Has patient had a PCN reaction occurring within the last 10 years: Yes If all of the above answers are "NO", then may proceed with Cephalosporin use.  . Quinine Derivatives Hives and Rash    Time coordinating discharge: Over 30 minutes   SIGNED:   Harold Hedge, D.O. [facility] 06/17/2019, 12:30 PM

## 2019-06-17 NOTE — Progress Notes (Signed)
Patient resting comfortably during shift report. Denies complaints. IV has been removed by patient and was discovered in the bed with her.   Will confirm dc plan prior to replacing.

## 2019-06-17 NOTE — TOC Transition Note (Signed)
Transition of Care [facility]) - CM/SW Discharge Note   Patient Details  Name: Kim Mullins MRN: [id number] Date of Birth: Dec 05, 1929  [facility] CM/SW Contact:  Erenest Rasher, RN Phone Number: [phone]  06/17/2019, 1:01 PM   Clinical Narrative:     Notified [facility] of scheduled dc home today. Faxed dc summary to [facility].   Final next level of care: Home w Hospice Care Barriers to Discharge: No Barriers Identified   Patient Goals and CMS Choice Patient states their goals for this hospitalization and ongoing recovery are:: home with hospice CMS Medicare.gov Compare Post Acute Care list provided to:: Patient Represenative (must comment)(grand daughter) Choice offered to / list presented to : [facility] POA / Guardian(granddaughter)  Discharge Placement                       Discharge Plan and Services In-house Referral: Hospice / Palliative Care Discharge Planning Services: CM Consult Post Acute Care Choice: Hospice          DME Arranged: (Hospice to supply DME)         HH Arranged: RN [facility] Agency: Hospice and [place] Date [place]: 06/14/19 Time [place]: 9432 Representative spoke with at [place]: [place] ([place]) Interventions     Readmission Risk Interventions No flowsheet data found.

## 2019-06-17 NOTE — Progress Notes (Signed)
Call placed to [facility] to notify of telemetry monitoring d/c.   

## 2019-06-18 ENCOUNTER — Other Ambulatory Visit (HOSPITAL_COMMUNITY): Payer: Self-pay

## 2019-06-18 NOTE — Progress Notes (Signed)
Paramedicine Encounter    Patient ID: Kim Mullins, female    DOB: 08/29/1930, 85 y.o.   MRN: [id number]   Patient Care Team: Lin Landsman, MD as PCP - General (Family Medicine) Larey Dresser, MD as PCP - Advanced Heart Failure (Cardiology) Jorge Ny, LCSW as Social Worker (Licensed Clinical Social Worker)  Patient Active Problem List   Diagnosis Date Noted  . Weakness generalized   . Congestive heart failure (CHF) ([place]) 06/11/2019  . Chronic respiratory failure with hypoxia ([place]) 05/29/2019  . Chronic constipation 05/29/2019  . Chronic non-seasonal allergic rhinitis 05/29/2019  . 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 FTT (failure to thrive) in adult 05/15/2019  . Hematemesis 05/14/2019  . Chronic systolic CHF (congestive heart failure) ([place]) 05/14/2019  . Acute renal failure superimposed on stage 3 chronic kidney disease ([place]) 05/14/2019  . Fall 05/14/2019  . Hyponatremia 05/14/2019  . Hyperkalemia 05/14/2019  . Obesity (BMI 30-39.9) 10/20/2018  . Acute on chronic heart failure ([place]) 08/19/2018  . DNR (do not resuscitate) discussion   . Palliative care by specialist   . Acute respiratory failure with hypoxia ([place]) 08/18/2018  . Diabetes ([place]) 08/18/2018  . CKD (chronic kidney disease) 08/18/2018  . Amyloidosis ([place]) 08/18/2018  . Acute on chronic combined systolic and diastolic heart failure ([place]) 08/17/2018  . GERD (gastroesophageal reflux disease) 08/17/2018  . Elevated troponin 08/17/2018  . Glaucoma 08/17/2018  . Hyperbilirubinemia 08/17/2018  . CHF (congestive heart failure) ([place]) 07/18/2018  . Gait abnormality 05/03/2017  . Hypotension 04/14/2016  . Hypotension due to drugs   . Chronic anticoagulation   . TIA (transient ischemic attack) 04/12/2016  . UTI (lower urinary tract infection) 04/12/2016  . AKI (acute kidney injury) ([place]) 04/12/2016  . Atrial fibrillation ([place]) 09/12/2015  . Occipital infarction ([place])   . History of stroke 06/18/2015  .  Homonymous hemianopsia   . Pacemaker 01/21/2015  . OSA (obstructive sleep apnea) 10/23/2014  . Dizziness 10/21/2014  . Complete heart block ([place]) 10/06/2014  . Benign essential HTN 10/04/2014  . Acute on chronic systolic CHF (congestive heart failure) ([place]) 10/04/2014  . Abnormal cardiovascular stress test 08/27/2014  . Pulmonary HTN ([place]) 08/16/2014  . DCM (dilated cardiomyopathy) ([place]) 08/16/2014  . Cough 07/13/2014  . Esophageal stricture 06/11/2014  . Nonspecific (abnormal) findings on radiological and other examination of gastrointestinal tract 05/29/2014  . Dyspnea 04/29/2014  . Osteoarthritis of right shoulder region 06/26/2013  . DJD (degenerative joint disease) of hip 11/16/2011    Class: Present on Admission    Current Outpatient Medications:  .  apixaban (ELIQUIS) 2.5 MG TABS tablet, Take 1 tablet (2.5 mg total) by mouth 2 (two) times daily., Disp: 60 tablet, Rfl: 0 .  bumetanide (BUMEX) 2 MG tablet, Take 2 tablets (4 mg total) by mouth 2 (two) times daily., Disp: 120 tablet, Rfl: 0 .  diclofenac sodium (VOLTAREN) 1 % GEL, Apply 2 g topically 3 (three) times daily as needed (pain, inflammation)., Disp: 50 g, Rfl: 0 .  fexofenadine (ALLEGRA) 180 MG tablet, Take 1 tablet (180 mg total) by mouth daily., Disp: 30 tablet, Rfl: 0 .  glucose blood test strip, Use to test blood sugar twice daily (Accuchek Smartview), Disp: 100 each, Rfl: 0 .  hydrocortisone cream 1 %, Apply topically daily as needed for itching. Cream; amt: sparingly; topical Special Instructions: apply to itchy skin Once A Day - PRN PRN 1, Disp: 30 g, Rfl: 0 .  latanoprost (XALATAN) 0.005 % ophthalmic solution, Place 1  drop into both eyes at bedtime., Disp: 2.5 mL, Rfl: 0 .  OXYGEN, Inhale 2 L into the lungs continuous. , Disp: , Rfl:  .  pantoprazole (PROTONIX) 40 MG tablet, Take 1 tablet (40 mg total) by mouth daily., Disp: 30 tablet, Rfl: 0 .  timolol (TIMOPTIC) 0.5 % ophthalmic solution, Place 1 drop into both  eyes at bedtime., Disp: 10 mL, Rfl: 0 .  Acetaminophen (TYLENOL) 325 MG CAPS, Take 650 mg by mouth daily as needed (for shoulder pain). , Disp: , Rfl:  .  polyethylene glycol (MIRALAX / GLYCOLAX) 17 g packet, Take 17 g by mouth daily. (Patient not taking: Reported on 06/18/2019), Disp: 14 each, Rfl: 0 .  senna (SENOKOT) 8.6 MG TABS tablet, Take 1 tablet (8.6 mg total) by mouth at bedtime., Disp: 120 tablet, Rfl: 0 Allergies  Allergen Reactions  . Hydrocodone Other (See Comments)    Took away all energy and made her stop eating food for short time  . Percocet [Oxycodone-Acetaminophen] Other (See Comments)    Too away all energy and made her stop eating food for short time  . Eggs Or Egg-Derived Products Hives and Rash  . Mercurial Derivatives Itching and Rash  . Penicillins Hives and Rash    Has patient had a PCN reaction causing immediate rash, facial/tongue/throat swelling, SOB or lightheadedness with hypotension: Yes Has patient had a PCN reaction causing severe rash involving mucus membranes or skin necrosis: No Has patient had a PCN reaction that required hospitalization: No Has patient had a PCN reaction occurring within the last 10 years: Yes If all of the above answers are "NO", then may proceed with Cephalosporin use.  . Quinine Derivatives Hives and Rash     Social History   Socioeconomic History  . Marital status: Widowed    Spouse name: Not on file  . Number of children: 0  . Years of education: Not on file  . Highest education level: Doctorate  Occupational History  . Occupation: retired  Scientific laboratory technician  . Financial resource strain: Not hard at all  . Food insecurity    Worry: Never true    Inability: Never true  . Transportation needs    Medical: No    Non-medical: No  Tobacco Use  . Smoking status: Former Smoker    Packs/day: 1.00    Years: 45.00    Pack years: 45.00    Types: Cigarettes    Quit date: 07/24/1978    Years since quitting: 40.9  . Smokeless  tobacco: Never Used  Substance and Sexual Activity  . Alcohol use: Yes    Alcohol/week: 7.0 standard drinks    Types: 7 Glasses of wine per week    Comment: every day  . Drug use: No  . Sexual activity: Not Currently  Lifestyle  . Physical activity    Days per week: Not on file    Minutes per session: Not on file  . Stress: Not on file  Relationships  . Social Herbalist on phone: Not on file    Gets together: Not on file    Attends religious service: Not on file    Active member of club or organization: Not on file    Attends meetings of clubs or organizations: Not on file    Relationship status: Not on file  . Intimate partner violence    Fear of current or ex partner: Not on file    Emotionally abused: Not on file  Physically abused: Not on file    Forced sexual activity: Not on file  Other Topics Concern  . Not on file  Social History Narrative   Lives alone    Physical Exam HENT:     Head: Normocephalic.  Eyes:     Pupils: Pupils are equal, round, and reactive to light.  Neck:     Musculoskeletal: Normal range of motion.  Cardiovascular:     Rate and Rhythm: Normal rate. Rhythm irregular.     Pulses: Normal pulses.  Pulmonary:     Effort: Respiratory distress present.     Breath sounds: Normal breath sounds.  Abdominal:     General: Abdomen is flat.  Musculoskeletal: Normal range of motion.     Right lower leg: Edema present.     Left lower leg: Edema present.  Skin:    General: Skin is warm and dry.     Capillary Refill: Capillary refill takes less than 2 seconds.  Neurological:     Mental Status: She is alert. Mental status is at baseline.     Arrived for home visit for Mrs. Kittleson who was seated on the edge of her bed upright, alert and stating she feels "fine". Mrs. Walla  Was just discharged from [facility] yesterday due to fluid overload. Today Colletta Maryland had some lower extremity swelling as well as some shortness of breath on exertion. Colletta Maryland  reports feeling okay other than being tired. Jacki's granddaughter explained that Colletta Maryland has had a decrease in mobility since her first admission to the hospital. Colletta Maryland can walk with assistance only. While there Hospice RN Quita Skye was examining Colletta Maryland and informing her of the process of being under Hospice Care. Adam was given my information In case he needed to contact me. Jacki's medications were confirmed and verified. Pill box was filled accordingly. Vitals were obtained and are as noted. Colletta Maryland was weighed at 145lbs. Jacki's compression socks were placed and her legs were elevated prior to completion of home visit. Colletta Maryland agreed to Monday visit. Visit complete.     NO MEDS ORDERED    WEIGHT-145.6lbs CBG- 191    Future Appointments  Date Time Provider [place]  07/04/2019 11:15 AM Gardiner Barefoot, DPM [facility]     ACTION: Home visit completed Next visit planned for one week

## 2019-06-25 ENCOUNTER — Telehealth (HOSPITAL_COMMUNITY): Payer: Self-pay | Admitting: *Deleted

## 2019-06-25 ENCOUNTER — Other Ambulatory Visit (HOSPITAL_COMMUNITY): Payer: Self-pay

## 2019-06-25 NOTE — Progress Notes (Signed)
Paramedicine Encounter    Patient ID: Kim Mullins, female    DOB: [birth year], 85 y.o.   MRN: [id number]   Patient Care Team: Lin Landsman, MD as PCP - General (Family Medicine) Larey Dresser, MD as PCP - Advanced Heart Failure (Cardiology) Jorge Ny, LCSW as Social Worker (Licensed Clinical Social Worker)  Patient Active Problem List   Diagnosis Date Noted  . Weakness generalized   . Congestive heart failure (CHF) ([place]) 06/11/2019  . Chronic respiratory failure with hypoxia ([place]) 05/29/2019  . Chronic constipation 05/29/2019  . Chronic non-seasonal allergic rhinitis 05/29/2019  . 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 FTT (failure to thrive) in adult 05/15/2019  . Hematemesis 05/14/2019  . Chronic systolic CHF (congestive heart failure) ([place]) 05/14/2019  . Acute renal failure superimposed on stage 3 chronic kidney disease ([place]) 05/14/2019  . Fall 05/14/2019  . Hyponatremia 05/14/2019  . Hyperkalemia 05/14/2019  . Obesity (BMI 30-39.9) 10/20/2018  . Acute on chronic heart failure ([place]) 08/19/2018  . DNR (do not resuscitate) discussion   . Palliative care by specialist   . Acute respiratory failure with hypoxia ([place]) 08/18/2018  . Diabetes ([place]) 08/18/2018  . CKD (chronic kidney disease) 08/18/2018  . Amyloidosis ([place]) 08/18/2018  . Acute on chronic combined systolic and diastolic heart failure ([place]) 08/17/2018  . GERD (gastroesophageal reflux disease) 08/17/2018  . Elevated troponin 08/17/2018  . Glaucoma 08/17/2018  . Hyperbilirubinemia 08/17/2018  . CHF (congestive heart failure) ([place]) 07/18/2018  . Gait abnormality 05/03/2017  . Hypotension 04/14/2016  . Hypotension due to drugs   . Chronic anticoagulation   . TIA (transient ischemic attack) 04/12/2016  . UTI (lower urinary tract infection) 04/12/2016  . AKI (acute kidney injury) ([place]) 04/12/2016  . Atrial fibrillation ([place]) 09/12/2015  . Occipital infarction ([place])   . History of stroke 06/18/2015  .  Homonymous hemianopsia   . Pacemaker 01/21/2015  . OSA (obstructive sleep apnea) 10/23/2014  . Dizziness 10/21/2014  . Complete heart block ([place]) 10/06/2014  . Benign essential HTN 10/04/2014  . Acute on chronic systolic CHF (congestive heart failure) ([place]) 10/04/2014  . Abnormal cardiovascular stress test 08/27/2014  . Pulmonary HTN ([place]) 08/16/2014  . DCM (dilated cardiomyopathy) ([place]) 08/16/2014  . Cough 07/13/2014  . Esophageal stricture 06/11/2014  . Nonspecific (abnormal) findings on radiological and other examination of gastrointestinal tract 05/29/2014  . Dyspnea 04/29/2014  . Osteoarthritis of right shoulder region 06/26/2013  . DJD (degenerative joint disease) of hip 11/16/2011    Class: Present on Admission    Current Outpatient Medications:  .  apixaban (ELIQUIS) 2.5 MG TABS tablet, Take 1 tablet (2.5 mg total) by mouth 2 (two) times daily., Disp: 60 tablet, Rfl: 0 .  bumetanide (BUMEX) 2 MG tablet, Take 2 tablets (4 mg total) by mouth 2 (two) times daily., Disp: 120 tablet, Rfl: 0 .  fexofenadine (ALLEGRA) 180 MG tablet, Take 1 tablet (180 mg total) by mouth daily., Disp: 30 tablet, Rfl: 0 .  latanoprost (XALATAN) 0.005 % ophthalmic solution, Place 1 drop into both eyes at bedtime., Disp: 2.5 mL, Rfl: 0 .  OXYGEN, Inhale 2 L into the lungs continuous. , Disp: , Rfl:  .  pantoprazole (PROTONIX) 40 MG tablet, Take 1 tablet (40 mg total) by mouth daily., Disp: 30 tablet, Rfl: 0 .  timolol (TIMOPTIC) 0.5 % ophthalmic solution, Place 1 drop into both eyes at bedtime., Disp: 10 mL, Rfl: 0 .  Acetaminophen (TYLENOL) 325 MG CAPS, Take 650 mg by mouth  daily as needed (for shoulder pain). , Disp: , Rfl:  .  diclofenac sodium (VOLTAREN) 1 % GEL, Apply 2 g topically 3 (three) times daily as needed (pain, inflammation). (Patient not taking: Reported on 06/25/2019), Disp: 50 g, Rfl: 0 .  glucose blood test strip, Use to test blood sugar twice daily (Accuchek Smartview) (Patient not  taking: Reported on 06/25/2019), Disp: 100 each, Rfl: 0 .  hydrocortisone cream 1 %, Apply topically daily as needed for itching. Cream; amt: sparingly; topical Special Instructions: apply to itchy skin Once A Day - PRN PRN 1 (Patient not taking: Reported on 06/25/2019), Disp: 30 g, Rfl: 0 .  polyethylene glycol (MIRALAX / GLYCOLAX) 17 g packet, Take 17 g by mouth daily. (Patient not taking: Reported on 06/18/2019), Disp: 14 each, Rfl: 0 .  senna (SENOKOT) 8.6 MG TABS tablet, Take 1 tablet (8.6 mg total) by mouth at bedtime. (Patient not taking: Reported on 06/25/2019), Disp: 120 tablet, Rfl: 0 Allergies  Allergen Reactions  . Hydrocodone Other (See Comments)    Took away all energy and made her stop eating food for short time  . Percocet [Oxycodone-Acetaminophen] Other (See Comments)    Too away all energy and made her stop eating food for short time  . Eggs Or Egg-Derived Products Hives and Rash  . Mercurial Derivatives Itching and Rash  . Penicillins Hives and Rash    Has patient had a PCN reaction causing immediate rash, facial/tongue/throat swelling, SOB or lightheadedness with hypotension: Yes Has patient had a PCN reaction causing severe rash involving mucus membranes or skin necrosis: No Has patient had a PCN reaction that required hospitalization: No Has patient had a PCN reaction occurring within the last 10 years: Yes If all of the above answers are "NO", then may proceed with Cephalosporin use.  . Quinine Derivatives Hives and Rash     Social History   Socioeconomic History  . Marital status: Widowed    Spouse name: Not on file  . Number of children: 0  . Years of education: Not on file  . Highest education level: Doctorate  Occupational History  . Occupation: retired  Scientific laboratory technician  . Financial resource strain: Not hard at all  . Food insecurity    Worry: Never true    Inability: Never true  . Transportation needs    Medical: No    Non-medical: No  Tobacco Use  .  Smoking status: Former Smoker    Packs/day: 1.00    Years: 45.00    Pack years: 45.00    Types: Cigarettes    Quit date: 07/24/1978    Years since quitting: 40.9  . Smokeless tobacco: Never Used  Substance and Sexual Activity  . Alcohol use: Yes    Alcohol/week: 7.0 standard drinks    Types: 7 Glasses of wine per week    Comment: every day  . Drug use: No  . Sexual activity: Not Currently  Lifestyle  . Physical activity    Days per week: Not on file    Minutes per session: Not on file  . Stress: Not on file  Relationships  . Social Herbalist on phone: Not on file    Gets together: Not on file    Attends religious service: Not on file    Active member of club or organization: Not on file    Attends meetings of clubs or organizations: Not on file    Relationship status: Not on file  . Intimate  partner violence    Fear of current or ex partner: Not on file    Emotionally abused: Not on file    Physically abused: Not on file    Forced sexual activity: Not on file  Other Topics Concern  . Not on file  Social History Narrative   Lives alone    Physical Exam Vitals signs reviewed.  HENT:     Head: Normocephalic.     Nose: Nose normal.  Neck:     Musculoskeletal: Normal range of motion.  Cardiovascular:     Rate and Rhythm: Normal rate. Rhythm irregular.     Pulses: Normal pulses.  Pulmonary:     Effort: Pulmonary effort is normal.  Musculoskeletal:     Right lower leg: Edema present.     Left lower leg: Edema present.     Comments: Lower leg edema.  +3  Decreased mobility.   Neurological:     Mental Status: She is alert.     Arrived for home visit for Kennyth Lose today who was seated on the edge of her bed awake but drowsy. Granddaughter stated that her grandma has been more sleepy than usual over the last 5 days and has had a decrease in mobility also. Patient denied fever, chills,cough, nausea or vomiting. Patient wears depends diapers and upon standing a  strong odor was present. Kennyth Lose also has increased edema in both lower legs. Granddaughter stated she has had low sodium, has been wearing her compression socks and has kept her legs elevated due to sleeping a lot but not elevated above her hips. Vitals were obtained. Granddaughter spoke to [place] who advised doctor will start her on Cipro for 3 days to improve possible UTI. I spoke with HF clinic who advised patient to take one Metolazone every Tuesday to improve fluid retention. Patient agreed, granddaughter understood same. Patient has a home visit scheduled with Hospice RN tomorrow. Patient's granddaughter also expressed the need for side railing for her grandmas bed due to her sliding out of bed on Saturday with no reported injuries. I advised granddaughter to speak to Hospice RN about same. She agreed. Kennyth Lose denied shortness of breath or pain but just stated she was sleepy. Medications verified and placed in pill box accordingly. Home visit complete. Patient agreed to visit in one week.    Weight- 142lbs     Future Appointments  Date Time Provider [place]  07/04/2019 11:15 AM Gardiner Barefoot, DPM [facility]     ACTION: Home visit completed Next visit planned for one week

## 2019-06-25 NOTE — Telephone Encounter (Signed)
Heather w/paramedicine called to report +3 BLEE and fatigue. Per Dr.McLean add Metolazone 2.5mg  weekly on tuesdays. Heather aware.

## 2019-07-02 ENCOUNTER — Other Ambulatory Visit (HOSPITAL_COMMUNITY): Payer: Self-pay

## 2019-07-02 ENCOUNTER — Other Ambulatory Visit: Payer: Self-pay | Admitting: Adult Health

## 2019-07-02 NOTE — Progress Notes (Signed)
Paramedicine Encounter    Patient ID: Kim Mullins, female    DOB: [birth year], 85 y.o.   MRN: [id number]   Patient Care Team: Lin Landsman, MD as PCP - General (Family Medicine) Larey Dresser, MD as PCP - Advanced Heart Failure (Cardiology) Jorge Ny, LCSW as Social Worker (Licensed Clinical Social Worker)  Patient Active Problem List   Diagnosis Date Noted  . Weakness generalized   . Congestive heart failure (CHF) ([place]) 06/11/2019  . Chronic respiratory failure with hypoxia ([place]) 05/29/2019  . Chronic constipation 05/29/2019  . Chronic non-seasonal allergic rhinitis 05/29/2019  . 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 FTT (failure to thrive) in adult 05/15/2019  . Hematemesis 05/14/2019  . Chronic systolic CHF (congestive heart failure) ([place]) 05/14/2019  . Acute renal failure superimposed on stage 3 chronic kidney disease ([place]) 05/14/2019  . Fall 05/14/2019  . Hyponatremia 05/14/2019  . Hyperkalemia 05/14/2019  . Obesity (BMI 30-39.9) 10/20/2018  . Acute on chronic heart failure ([place]) 08/19/2018  . DNR (do not resuscitate) discussion   . Palliative care by specialist   . Acute respiratory failure with hypoxia ([place]) 08/18/2018  . Diabetes ([place]) 08/18/2018  . CKD (chronic kidney disease) 08/18/2018  . Amyloidosis ([place]) 08/18/2018  . Acute on chronic combined systolic and diastolic heart failure ([place]) 08/17/2018  . GERD (gastroesophageal reflux disease) 08/17/2018  . Elevated troponin 08/17/2018  . Glaucoma 08/17/2018  . Hyperbilirubinemia 08/17/2018  . CHF (congestive heart failure) ([place]) 07/18/2018  . Gait abnormality 05/03/2017  . Hypotension 04/14/2016  . Hypotension due to drugs   . Chronic anticoagulation   . TIA (transient ischemic attack) 04/12/2016  . UTI (lower urinary tract infection) 04/12/2016  . AKI (acute kidney injury) ([place]) 04/12/2016  . Atrial fibrillation ([place]) 09/12/2015  . Occipital infarction ([place])   . History of stroke 06/18/2015  .  Homonymous hemianopsia   . Pacemaker 01/21/2015  . OSA (obstructive sleep apnea) 10/23/2014  . Dizziness 10/21/2014  . Complete heart block ([place]) 10/06/2014  . Benign essential HTN 10/04/2014  . Acute on chronic systolic CHF (congestive heart failure) ([place]) 10/04/2014  . Abnormal cardiovascular stress test 08/27/2014  . Pulmonary HTN ([place]) 08/16/2014  . DCM (dilated cardiomyopathy) ([place]) 08/16/2014  . Cough 07/13/2014  . Esophageal stricture 06/11/2014  . Nonspecific (abnormal) findings on radiological and other examination of gastrointestinal tract 05/29/2014  . Dyspnea 04/29/2014  . Osteoarthritis of right shoulder region 06/26/2013  . DJD (degenerative joint disease) of hip 11/16/2011    Class: Present on Admission    Current Outpatient Medications:  .  apixaban (ELIQUIS) 2.5 MG TABS tablet, Take 1 tablet (2.5 mg total) by mouth 2 (two) times daily., Disp: 60 tablet, Rfl: 0 .  bumetanide (BUMEX) 2 MG tablet, Take 2 tablets (4 mg total) by mouth 2 (two) times daily., Disp: 120 tablet, Rfl: 0 .  fexofenadine (ALLEGRA) 180 MG tablet, Take 1 tablet (180 mg total) by mouth daily., Disp: 30 tablet, Rfl: 0 .  latanoprost (XALATAN) 0.005 % ophthalmic solution, Place 1 drop into both eyes at bedtime., Disp: 2.5 mL, Rfl: 0 .  metolazone (ZAROXOLYN) 2.5 MG tablet, Take 2.5 mg by mouth once a week., Disp: , Rfl:  .  OXYGEN, Inhale 2 L into the lungs continuous. , Disp: , Rfl:  .  senna (SENOKOT) 8.6 MG TABS tablet, Take 1 tablet (8.6 mg total) by mouth at bedtime., Disp: 120 tablet, Rfl: 0 .  timolol (TIMOPTIC) 0.5 % ophthalmic solution, Place 1 drop into  both eyes at bedtime., Disp: 10 mL, Rfl: 0 .  Acetaminophen (TYLENOL) 325 MG CAPS, Take 650 mg by mouth daily as needed (for shoulder pain). , Disp: , Rfl:  .  diclofenac sodium (VOLTAREN) 1 % GEL, Apply 2 g topically 3 (three) times daily as needed (pain, inflammation). (Patient not taking: Reported on 06/25/2019), Disp: 50 g, Rfl: 0 .   glucose blood test strip, Use to test blood sugar twice daily (Accuchek Smartview) (Patient not taking: Reported on 06/25/2019), Disp: 100 each, Rfl: 0 .  hydrocortisone cream 1 %, Apply topically daily as needed for itching. Cream; amt: sparingly; topical Special Instructions: apply to itchy skin Once A Day - PRN PRN 1 (Patient not taking: Reported on 06/25/2019), Disp: 30 g, Rfl: 0 .  pantoprazole (PROTONIX) 40 MG tablet, Take 1 tablet (40 mg total) by mouth daily., Disp: 30 tablet, Rfl: 0 .  polyethylene glycol (MIRALAX / GLYCOLAX) 17 g packet, Take 17 g by mouth daily. (Patient not taking: Reported on 06/18/2019), Disp: 14 each, Rfl: 0 Allergies  Allergen Reactions  . Hydrocodone Other (See Comments)    Took away all energy and made her stop eating food for short time  . Percocet [Oxycodone-Acetaminophen] Other (See Comments)    Too away all energy and made her stop eating food for short time  . Eggs Or Egg-Derived Products Hives and Rash  . Mercurial Derivatives Itching and Rash  . Penicillins Hives and Rash    Has patient had a PCN reaction causing immediate rash, facial/tongue/throat swelling, SOB or lightheadedness with hypotension: Yes Has patient had a PCN reaction causing severe rash involving mucus membranes or skin necrosis: No Has patient had a PCN reaction that required hospitalization: No Has patient had a PCN reaction occurring within the last 10 years: Yes If all of the above answers are "NO", then may proceed with Cephalosporin use.  . Quinine Derivatives Hives and Rash     Social History   Socioeconomic History  . Marital status: Widowed    Spouse name: Not on file  . Number of children: 0  . Years of education: Not on file  . Highest education level: Doctorate  Occupational History  . Occupation: retired  Scientific laboratory technician  . Financial resource strain: Not hard at all  . Food insecurity    Worry: Never true    Inability: Never true  . Transportation needs     Medical: No    Non-medical: No  Tobacco Use  . Smoking status: Former Smoker    Packs/day: 1.00    Years: 45.00    Pack years: 45.00    Types: Cigarettes    Quit date: 07/24/1978    Years since quitting: 40.9  . Smokeless tobacco: Never Used  Substance and Sexual Activity  . Alcohol use: Yes    Alcohol/week: 7.0 standard drinks    Types: 7 Glasses of wine per week    Comment: every day  . Drug use: No  . Sexual activity: Not Currently  Lifestyle  . Physical activity    Days per week: Not on file    Minutes per session: Not on file  . Stress: Not on file  Relationships  . Social Herbalist on phone: Not on file    Gets together: Not on file    Attends religious service: Not on file    Active member of club or organization: Not on file    Attends meetings of clubs or organizations: Not  on file    Relationship status: Not on file  . Intimate partner violence    Fear of current or ex partner: Not on file    Emotionally abused: Not on file    Physically abused: Not on file    Forced sexual activity: Not on file  Other Topics Concern  . Not on file  Social History Narrative   Lives alone    Physical Exam Vitals signs reviewed.  Constitutional:      Appearance: She is normal weight.  HENT:     Head: Normocephalic.     Nose: Nose normal.     Mouth/Throat:     Mouth: Mucous membranes are moist.  Eyes:     Pupils: Pupils are equal, round, and reactive to light.  Neck:     Musculoskeletal: Normal range of motion.  Cardiovascular:     Rate and Rhythm: Normal rate. Rhythm irregular.     Pulses: Normal pulses.     Heart sounds: Normal heart sounds.  Pulmonary:     Effort: Pulmonary effort is normal.     Breath sounds: Normal breath sounds.  Abdominal:     General: Abdomen is flat.     Palpations: Abdomen is soft.  Musculoskeletal: Normal range of motion.     Right lower leg: Edema present.     Left lower leg: Edema present.  Skin:    General: Skin is  warm and dry.     Capillary Refill: Capillary refill takes less than 2 seconds.  Neurological:     Mental Status: She is alert. Mental status is at baseline.  Psychiatric:        Mood and Affect: Mood normal.     Arrived for Jacki's home visit, Colletta Maryland was seated on her walker alert and oriented in no respiratory distress on her oxygen with no complaints. Colletta Maryland stated she feels much better this week. Vitals were obtained and as recorded. Jacki's medications were reviewed and confirmed. 2 pill boxes filled for Jacki. Colletta Maryland understood I would be back on 11/16. Colletta Maryland had some edema noted to both lower legs but improved from last week. Granddaughter stated Colletta Maryland was not eating as much but was ambulating well with her walker and had improvements with the smell of her urine and improvements with her not sleeping as much. Home visit complete.   Meds Ordered: Pantoprazole   Weight-133lbs CBG-140    Future Appointments  Date Time Provider [place]  07/04/2019 11:15 AM Gardiner Barefoot, DPM [facility]     ACTION: Home visit completed Next visit planned for 11/16

## 2019-07-04 ENCOUNTER — Ambulatory Visit: Payer: Medicare HMO | Admitting: Podiatry

## 2019-07-11 ENCOUNTER — Inpatient Hospital Stay (HOSPITAL_COMMUNITY)

## 2019-07-11 ENCOUNTER — Emergency Department (HOSPITAL_COMMUNITY)

## 2019-07-11 ENCOUNTER — Inpatient Hospital Stay (HOSPITAL_COMMUNITY)
Admission: EM | Admit: 2019-07-11 | Discharge: 2019-07-15 | DRG: 070 | Disposition: A | Discharge status: Hospice | Attending: Family Medicine | Admitting: Family Medicine

## 2019-07-11 ENCOUNTER — Other Ambulatory Visit: Payer: Self-pay

## 2019-07-11 DIAGNOSIS — Z950 Presence of cardiac pacemaker: Secondary | ICD-10-CM

## 2019-07-11 DIAGNOSIS — J3089 Other allergic rhinitis: Secondary | ICD-10-CM | POA: Diagnosis present

## 2019-07-11 DIAGNOSIS — R29818 Other symptoms and signs involving the nervous system: Secondary | ICD-10-CM | POA: Diagnosis not present

## 2019-07-11 DIAGNOSIS — I43 Cardiomyopathy in diseases classified elsewhere: Secondary | ICD-10-CM | POA: Diagnosis present

## 2019-07-11 DIAGNOSIS — J9601 Acute respiratory failure with hypoxia: Secondary | ICD-10-CM | POA: Diagnosis not present

## 2019-07-11 DIAGNOSIS — Z8673 Personal history of transient ischemic attack (TIA), and cerebral infarction without residual deficits: Secondary | ICD-10-CM | POA: Diagnosis not present

## 2019-07-11 DIAGNOSIS — Z66 Do not resuscitate: Secondary | ICD-10-CM | POA: Diagnosis present

## 2019-07-11 DIAGNOSIS — Z833 Family history of diabetes mellitus: Secondary | ICD-10-CM

## 2019-07-11 DIAGNOSIS — R4701 Aphasia: Secondary | ICD-10-CM | POA: Diagnosis present

## 2019-07-11 DIAGNOSIS — Z96641 Presence of right artificial hip joint: Secondary | ICD-10-CM | POA: Diagnosis present

## 2019-07-11 DIAGNOSIS — I4811 Longstanding persistent atrial fibrillation: Secondary | ICD-10-CM | POA: Diagnosis not present

## 2019-07-11 DIAGNOSIS — G4733 Obstructive sleep apnea (adult) (pediatric): Secondary | ICD-10-CM | POA: Diagnosis present

## 2019-07-11 DIAGNOSIS — I130 Hypertensive heart and chronic kidney disease with heart failure and stage 1 through stage 4 chronic kidney disease, or unspecified chronic kidney disease: Secondary | ICD-10-CM | POA: Diagnosis present

## 2019-07-11 DIAGNOSIS — I442 Atrioventricular block, complete: Secondary | ICD-10-CM | POA: Diagnosis present

## 2019-07-11 DIAGNOSIS — I5043 Acute on chronic combined systolic (congestive) and diastolic (congestive) heart failure: Secondary | ICD-10-CM | POA: Diagnosis present

## 2019-07-11 DIAGNOSIS — I272 Pulmonary hypertension, unspecified: Secondary | ICD-10-CM | POA: Diagnosis not present

## 2019-07-11 DIAGNOSIS — F0390 Unspecified dementia without behavioral disturbance: Secondary | ICD-10-CM | POA: Diagnosis present

## 2019-07-11 DIAGNOSIS — N19 Unspecified kidney failure: Secondary | ICD-10-CM

## 2019-07-11 DIAGNOSIS — I420 Dilated cardiomyopathy: Secondary | ICD-10-CM | POA: Diagnosis present

## 2019-07-11 DIAGNOSIS — N179 Acute kidney failure, unspecified: Secondary | ICD-10-CM | POA: Diagnosis present

## 2019-07-11 DIAGNOSIS — N185 Chronic kidney disease, stage 5: Secondary | ICD-10-CM | POA: Diagnosis not present

## 2019-07-11 DIAGNOSIS — K7682 Hepatic encephalopathy: Secondary | ICD-10-CM | POA: Diagnosis present

## 2019-07-11 DIAGNOSIS — J9611 Chronic respiratory failure with hypoxia: Secondary | ICD-10-CM | POA: Diagnosis present

## 2019-07-11 DIAGNOSIS — E854 Organ-limited amyloidosis: Secondary | ICD-10-CM | POA: Diagnosis present

## 2019-07-11 DIAGNOSIS — I5033 Acute on chronic diastolic (congestive) heart failure: Secondary | ICD-10-CM | POA: Diagnosis not present

## 2019-07-11 DIAGNOSIS — G9340 Encephalopathy, unspecified: Secondary | ICD-10-CM | POA: Diagnosis not present

## 2019-07-11 DIAGNOSIS — I480 Paroxysmal atrial fibrillation: Secondary | ICD-10-CM | POA: Diagnosis present

## 2019-07-11 DIAGNOSIS — K219 Gastro-esophageal reflux disease without esophagitis: Secondary | ICD-10-CM | POA: Diagnosis present

## 2019-07-11 DIAGNOSIS — I959 Hypotension, unspecified: Secondary | ICD-10-CM | POA: Diagnosis not present

## 2019-07-11 DIAGNOSIS — N189 Chronic kidney disease, unspecified: Secondary | ICD-10-CM | POA: Diagnosis not present

## 2019-07-11 DIAGNOSIS — K222 Esophageal obstruction: Secondary | ICD-10-CM

## 2019-07-11 DIAGNOSIS — Z20828 Contact with and (suspected) exposure to other viral communicable diseases: Secondary | ICD-10-CM | POA: Diagnosis present

## 2019-07-11 DIAGNOSIS — Z7401 Bed confinement status: Secondary | ICD-10-CM | POA: Diagnosis not present

## 2019-07-11 DIAGNOSIS — I5084 End stage heart failure: Secondary | ICD-10-CM | POA: Diagnosis present

## 2019-07-11 DIAGNOSIS — I4891 Unspecified atrial fibrillation: Secondary | ICD-10-CM | POA: Diagnosis present

## 2019-07-11 DIAGNOSIS — E876 Hypokalemia: Secondary | ICD-10-CM | POA: Diagnosis present

## 2019-07-11 DIAGNOSIS — I10 Essential (primary) hypertension: Secondary | ICD-10-CM | POA: Diagnosis not present

## 2019-07-11 DIAGNOSIS — E1122 Type 2 diabetes mellitus with diabetic chronic kidney disease: Secondary | ICD-10-CM | POA: Diagnosis present

## 2019-07-11 DIAGNOSIS — E871 Hypo-osmolality and hyponatremia: Secondary | ICD-10-CM | POA: Diagnosis present

## 2019-07-11 DIAGNOSIS — R627 Adult failure to thrive: Secondary | ICD-10-CM | POA: Diagnosis present

## 2019-07-11 DIAGNOSIS — I129 Hypertensive chronic kidney disease with stage 1 through stage 4 chronic kidney disease, or unspecified chronic kidney disease: Secondary | ICD-10-CM | POA: Diagnosis not present

## 2019-07-11 DIAGNOSIS — M2550 Pain in unspecified joint: Secondary | ICD-10-CM | POA: Diagnosis not present

## 2019-07-11 DIAGNOSIS — I5023 Acute on chronic systolic (congestive) heart failure: Secondary | ICD-10-CM | POA: Diagnosis not present

## 2019-07-11 DIAGNOSIS — N184 Chronic kidney disease, stage 4 (severe): Secondary | ICD-10-CM | POA: Diagnosis not present

## 2019-07-11 DIAGNOSIS — G9341 Metabolic encephalopathy: Principal | ICD-10-CM | POA: Diagnosis present

## 2019-07-11 DIAGNOSIS — Z880 Allergy status to penicillin: Secondary | ICD-10-CM

## 2019-07-11 DIAGNOSIS — Z91012 Allergy to eggs: Secondary | ICD-10-CM

## 2019-07-11 DIAGNOSIS — Z90710 Acquired absence of both cervix and uterus: Secondary | ICD-10-CM

## 2019-07-11 DIAGNOSIS — Z7901 Long term (current) use of anticoagulants: Secondary | ICD-10-CM

## 2019-07-11 DIAGNOSIS — K7200 Acute and subacute hepatic failure without coma: Secondary | ICD-10-CM | POA: Diagnosis present

## 2019-07-11 DIAGNOSIS — Z885 Allergy status to narcotic agent status: Secondary | ICD-10-CM

## 2019-07-11 DIAGNOSIS — R680 Hypothermia, not associated with low environmental temperature: Secondary | ICD-10-CM | POA: Diagnosis present

## 2019-07-11 DIAGNOSIS — Z87891 Personal history of nicotine dependence: Secondary | ICD-10-CM

## 2019-07-11 DIAGNOSIS — Z9981 Dependence on supplemental oxygen: Secondary | ICD-10-CM

## 2019-07-11 DIAGNOSIS — R0689 Other abnormalities of breathing: Secondary | ICD-10-CM | POA: Diagnosis not present

## 2019-07-11 DIAGNOSIS — Z79899 Other long term (current) drug therapy: Secondary | ICD-10-CM

## 2019-07-11 DIAGNOSIS — Z888 Allergy status to other drugs, medicaments and biological substances status: Secondary | ICD-10-CM

## 2019-07-11 DIAGNOSIS — Z8249 Family history of ischemic heart disease and other diseases of the circulatory system: Secondary | ICD-10-CM

## 2019-07-11 LAB — URINALYSIS, ROUTINE W REFLEX MICROSCOPIC
Bilirubin Urine: NEGATIVE
Glucose, UA: NEGATIVE mg/dL
Hgb urine dipstick: NEGATIVE
Ketones, ur: NEGATIVE mg/dL
Leukocytes,Ua: NEGATIVE
Nitrite: NEGATIVE
Protein, ur: NEGATIVE mg/dL
Specific Gravity, Urine: 1.006 (ref 1.005–1.030)
pH: 7 (ref 5.0–8.0)

## 2019-07-11 LAB — COMPREHENSIVE METABOLIC PANEL
ALT: 16 U/L (ref 0–44)
AST: 88 U/L — ABNORMAL HIGH (ref 15–41)
Albumin: 3.5 g/dL (ref 3.5–5.0)
Alkaline Phosphatase: 92 U/L (ref 38–126)
Anion gap: 22 — ABNORMAL HIGH (ref 5–15)
BUN: 74 mg/dL — ABNORMAL HIGH (ref 8–23)
CO2: 21 mmol/L — ABNORMAL LOW (ref 22–32)
Calcium: 8.7 mg/dL — ABNORMAL LOW (ref 8.9–10.3)
Chloride: 86 mmol/L — ABNORMAL LOW (ref 98–111)
Creatinine, Ser: 2.3 mg/dL — ABNORMAL HIGH (ref 0.44–1.00)
GFR calc Af Amer: 22 mL/min — ABNORMAL LOW (ref >60)
GFR calc non Af Amer: 19 mL/min — ABNORMAL LOW (ref >60)
Glucose, Bld: 128 mg/dL — ABNORMAL HIGH (ref 70–99)
Potassium: 3.5 mmol/L (ref 3.5–5.1)
Sodium: 129 mmol/L — ABNORMAL LOW (ref 135–145)
Total Bilirubin: 10.3 mg/dL — ABNORMAL HIGH (ref 0.3–1.2)
Total Protein: 7.5 g/dL (ref 6.5–8.1)

## 2019-07-11 LAB — CBC
HCT: 41.2 % (ref 36.0–46.0)
Hemoglobin: 13.9 g/dL (ref 12.0–15.0)
MCH: 29.4 pg (ref 26.0–34.0)
MCHC: 33.7 g/dL (ref 30.0–36.0)
MCV: 87.3 fL (ref 80.0–100.0)
Platelets: 187 10*3/uL (ref 150–400)
RBC: 4.72 MIL/uL (ref 3.87–5.11)
RDW: 20.3 % — ABNORMAL HIGH (ref 11.5–15.5)
WBC: 4.2 10*3/uL (ref 4.0–10.5)
nRBC: 0.5 % — ABNORMAL HIGH (ref 0.0–0.2)

## 2019-07-11 LAB — CBG MONITORING, ED: Glucose-Capillary: 98 mg/dL (ref 70–99)

## 2019-07-11 LAB — I-STAT CHEM 8, ED
BUN: 102 mg/dL — ABNORMAL HIGH (ref 8–23)
Calcium, Ion: 0.88 mmol/L — CL (ref 1.15–1.40)
Chloride: 86 mmol/L — ABNORMAL LOW (ref 98–111)
Creatinine, Ser: 2 mg/dL — ABNORMAL HIGH (ref 0.44–1.00)
Glucose, Bld: 127 mg/dL — ABNORMAL HIGH (ref 70–99)
HCT: 48 % — ABNORMAL HIGH (ref 36.0–46.0)
Hemoglobin: 16.3 g/dL — ABNORMAL HIGH (ref 12.0–15.0)
Potassium: 3.3 mmol/L — ABNORMAL LOW (ref 3.5–5.1)
Sodium: 131 mmol/L — ABNORMAL LOW (ref 135–145)
TCO2: 27 mmol/L (ref 22–32)

## 2019-07-11 LAB — DIFFERENTIAL
Abs Immature Granulocytes: 0.03 10*3/uL (ref 0.00–0.07)
Basophils Absolute: 0 10*3/uL (ref 0.0–0.1)
Basophils Relative: 1 %
Eosinophils Absolute: 0 10*3/uL (ref 0.0–0.5)
Eosinophils Relative: 1 %
Immature Granulocytes: 1 %
Lymphocytes Relative: 12 %
Lymphs Abs: 0.5 10*3/uL — ABNORMAL LOW (ref 0.7–4.0)
Monocytes Absolute: 0.2 10*3/uL (ref 0.1–1.0)
Monocytes Relative: 5 %
Neutro Abs: 3.4 10*3/uL (ref 1.7–7.7)
Neutrophils Relative %: 80 %

## 2019-07-11 LAB — SODIUM, URINE, RANDOM: Sodium, Ur: 67 mmol/L

## 2019-07-11 LAB — CREATININE, URINE, RANDOM: Creatinine, Urine: 45.5 mg/dL

## 2019-07-11 MED ORDER — SODIUM CHLORIDE 0.9% FLUSH: 3.0000 mL | INTRAVENOUS | Status: DC | PRN

## 2019-07-11 MED ORDER — HEPARIN SODIUM (PORCINE) 5000 UNIT/ML IJ SOLN
5000.0000 [IU] | Freq: Three times a day (TID) | INTRAMUSCULAR | Status: DC
  Administered 2019-07-11 – 2019-07-15 (×11): 5000 [IU] via SUBCUTANEOUS
  Filled 2019-07-11 (×11): qty 1

## 2019-07-11 MED ORDER — PANTOPRAZOLE SODIUM 40 MG IV SOLR
40.0000 mg | INTRAVENOUS | Status: DC
  Administered 2019-07-11 – 2019-07-14 (×4): 40 mg via INTRAVENOUS
  Filled 2019-07-11 (×4): qty 40

## 2019-07-11 MED ORDER — SODIUM CHLORIDE 0.9 % IV SOLN: 250.0000 mL | INTRAVENOUS | Status: DC | PRN

## 2019-07-11 MED ORDER — FUROSEMIDE 10 MG/ML IJ SOLN
80.0000 mg | Freq: Once | INTRAMUSCULAR | Status: AC
  Administered 2019-07-11: 80 mg via INTRAVENOUS
  Filled 2019-07-11: qty 8

## 2019-07-11 MED ORDER — TIMOLOL MALEATE 0.5 % OP SOLN
1.0000 [drp] | Freq: Every day | OPHTHALMIC | Status: DC
  Administered 2019-07-12 – 2019-07-14 (×3): 1 [drp] via OPHTHALMIC
  Filled 2019-07-11: qty 5

## 2019-07-11 MED ORDER — SODIUM CHLORIDE 0.9% FLUSH
3.0000 mL | Freq: Two times a day (BID) | INTRAVENOUS | Status: DC
  Administered 2019-07-12 – 2019-07-15 (×5): 3 mL via INTRAVENOUS

## 2019-07-11 MED ORDER — LATANOPROST 0.005 % OP SOLN
1.0000 [drp] | Freq: Every day | OPHTHALMIC | Status: DC
  Administered 2019-07-12 – 2019-07-14 (×3): 1 [drp] via OPHTHALMIC
  Filled 2019-07-11: qty 2.5

## 2019-07-11 NOTE — ED Notes (Signed)
Pt wears 2L [place] at baseline. VAN negative.

## 2019-07-11 NOTE — ED Provider Notes (Signed)
[facility] EMERGENCY DEPARTMENT Provider Note   CSN: [id number] Arrival date & time: 07/11/19  1420     History   Chief Complaint Chief Complaint  Patient presents with   Aphasia    HPI Kim Mullins is a 85 y.o. female.     HPI  Patient presents today for altered mental status and difficulty speaking.  Her hospice services called, and they state that they only see patient once we cannot request patient was in the hospital.  Granddaughter is called for further history, and she states that patient yesterday evening greater than 24 hours ago started to become confused and had trouble expressing her thoughts.  At baseline, patient is alert and oriented x4 and able to carry a conversation well even though she has mild dementia.  She would say random words in the midst of a conversation such as "ice cream" while telling a story.  At 1 point yesterday, patient forgot how to use to rule and did not know what it was for.  Patient has a pacemaker.  At this time, patient is alert and oriented for person and place.  She is not oriented to time or year.  She does with a globus, but she is otherwise unable to carry on a conversation.  She states yes or no and has generalized weakness.  When asked why she is in the hospital, she states that "she is an old woman ".  History and ROS is limited considering mental status at this time.  Past Medical History:  Diagnosis Date   Anemia    occassionally   Arthritis    all over.   Asthma    Allergixc reaction to cats.   Atrial fibrillation (HCC)    Bowel obstruction (HCC)    Chronic systolic CHF (congestive heart failure) (HCC)    EF 30-35% by cath, echo 2016 EF 50%   Complete heart block ([place])    a. s/p MDT CRTP pacemaker   DCM (dilated cardiomyopathy) ([place]) 08/16/2014   normal coronary arteries on cath with EF 30-45%.  EF now 50% by echo 11/2014   Diabetes mellitus 2006   Diet and exercise controlled.    Dyspnea    GERD (gastroesophageal reflux disease)    occ   Glaucoma 08/17/2018   Hypertension    Left tibial fracture 2007   OSA (obstructive sleep apnea) 10/23/2014   Moderate with AHI 21/hr   Osteoarthritis of right shoulder region 06/26/2013   Stroke [facility]     Patient Active Problem List   Diagnosis Date Noted   Weakness generalized    Congestive heart failure (CHF) ([place]) 06/11/2019   Chronic respiratory failure with hypoxia (HCC) 05/29/2019   Chronic constipation 05/29/2019   Chronic non-seasonal allergic rhinitis 05/29/2019   Cardiac amyloidosis (HCC)    FTT (failure to thrive) in adult 05/15/2019   Hematemesis 73/22/0254   Chronic systolic CHF (congestive heart failure) ([place]) 05/14/2019   Acute renal failure superimposed on stage 3 chronic kidney disease ([place]) 05/14/2019   Fall 05/14/2019   Hyponatremia 05/14/2019   Hyperkalemia 05/14/2019   Obesity (BMI 30-39.9) 10/20/2018   Acute on chronic heart failure ([place]) 08/19/2018   DNR (do not resuscitate) discussion    Palliative care by specialist    Acute respiratory failure with hypoxia ([place]) 08/18/2018   Diabetes ([place]) 08/18/2018   CKD (chronic kidney disease) 08/18/2018   Amyloidosis ([place]) 08/18/2018   Acute on chronic combined systolic and diastolic heart failure ([place]) 08/17/2018  GERD (gastroesophageal reflux disease) 08/17/2018   Elevated troponin 08/17/2018   Glaucoma 08/17/2018   Hyperbilirubinemia 08/17/2018   CHF (congestive heart failure) ([place]) 07/18/2018   Gait abnormality 05/03/2017   Hypotension 04/14/2016   Hypotension due to drugs    Chronic anticoagulation    TIA (transient ischemic attack) 04/12/2016   UTI (lower urinary tract infection) 04/12/2016   AKI (acute kidney injury) ([place]) 04/12/2016   Atrial fibrillation ([place]) 09/12/2015   Occipital infarction ([place])    History of stroke 06/18/2015   Homonymous hemianopsia    Pacemaker 01/21/2015   OSA  (obstructive sleep apnea) 10/23/2014   Dizziness 10/21/2014   Complete heart block ([place]) 10/06/2014   Benign essential HTN 10/04/2014   Acute on chronic systolic CHF (congestive heart failure) ([place]) 10/04/2014   Abnormal cardiovascular stress test 08/27/2014   Pulmonary HTN ([place]) 08/16/2014   DCM (dilated cardiomyopathy) ([place]) 08/16/2014   Cough 07/13/2014   Esophageal stricture 06/11/2014   Nonspecific (abnormal) findings on radiological and other examination of gastrointestinal tract 05/29/2014   Dyspnea 04/29/2014   Osteoarthritis of right shoulder region 06/26/2013   DJD (degenerative joint disease) of hip 11/16/2011    Class: Present on Admission    Past Surgical History:  Procedure Laterality Date   ABDOMINAL HYSTERECTOMY  1969   BI-VENTRICULAR PACEMAKER INSERTION N/A 10/07/2014   MDT CRTP implanted by Dr Delanna Ahmadi [facility] STUDY N/A 06/11/2014   Procedure: BRAVO [place];  Surgeon: Inda Castle, MD;  Location: [facility] ENDOSCOPY;  Service: Endoscopy;  Laterality: N/A;   BUNIONECTOMY  1984   Bilateral   CARDIAC CATHETERIZATION     normal coronary arteries   [place]  2004   Bilateral   CATARACT EXTRACTION Right    early 2015   corn removal  1999   Bilateral feet   DILATION AND CURETTAGE OF UTERUS  1968   ESOPHAGOGASTRODUODENOSCOPY (EGD) WITH PROPOFOL N/A 06/11/2014   Procedure: ESOPHAGOGASTRODUODENOSCOPY (EGD) WITH PROPOFOL;  Surgeon: Inda Castle, MD;  Location: [facility] ENDOSCOPY;  Service: Endoscopy;  Laterality: N/A;   [place]   Surgery to fix Retainal detachment, bilateral   JOINT REPLACEMENT     LEFT AND RIGHT HEART CATHETERIZATION WITH CORONARY ANGIOGRAM N/A 08/29/2014   Procedure: LEFT AND RIGHT HEART CATHETERIZATION WITH CORONARY ANGIOGRAM;  Surgeon: Peter M Martinique, MD;  [facility] LAB;  Service: Cardiovascular;  Laterality: N/A;   MALONEY DILATION  06/11/2014   Procedure: Venia Minks DILATION;  Surgeon: Inda Castle,  MD;  Location: [facility] ENDOSCOPY;  Service: Endoscopy;;   PILONIDAL CYST [place] CATH N/A 07/24/2018   Procedure: RIGHT HEART CATH;  Surgeon: Larey Dresser, MD;  Location: [place] CV LAB;  Service: Cardiovascular;  Laterality: N/A;   TONSILLECTOMY  1942   TOTAL HIP ARTHROPLASTY  11/16/2011   Procedure: TOTAL HIP ARTHROPLASTY ANTERIOR APPROACH;  Surgeon: Hessie Dibble, MD;  Location: [place];  Service: Orthopedics;  Laterality: Right;  DEPUY   TOTAL SHOULDER ARTHROPLASTY Right 06/26/2013   Procedure: TOTAL SHOULDER ARTHROPLASTY;  Surgeon: Johnny Bridge, MD;  Location: [facility];  Service: Orthopedics;  Laterality: Right;     OB History   No obstetric history on file.      Home Medications    Prior to Admission medications   Medication Sig Start Date End Date Taking? Authorizing Provider  Acetaminophen (TYLENOL) 325 MG CAPS Take 650 mg by mouth daily as needed (for shoulder pain).  [provider]  apixaban (ELIQUIS) 2.5 MG TABS tablet Take 1 tablet (2.5 mg total) by mouth 2 (two) times daily. 06/08/19   Gerlene Fee, NP  bumetanide (BUMEX) 2 MG tablet Take 2 tablets (4 mg total) by mouth 2 (two) times daily. 06/08/19   Gerlene Fee, NP  diclofenac sodium (VOLTAREN) 1 % GEL Apply 2 g topically 3 (three) times daily as needed (pain, inflammation). Patient not taking: Reported on 06/25/2019 06/08/19   Gerlene Fee, NP  fexofenadine (ALLEGRA) 180 MG tablet Take 1 tablet (180 mg total) by mouth daily. 06/08/19   Gerlene Fee, NP  glucose blood test strip Use to test blood sugar twice daily (Accuchek Smartview) Patient not taking: Reported on 06/25/2019 07/19/16   Larey Dresser, MD  hydrocortisone cream 1 % Apply topically daily as needed for itching. Cream; amt: sparingly; topical Special Instructions: apply to itchy skin Once A Day - PRN PRN 1 Patient not taking: Reported on 06/25/2019 06/08/19   Gerlene Fee, NP  latanoprost (XALATAN)  0.005 % ophthalmic solution Place 1 drop into both eyes at bedtime. 06/08/19   Gerlene Fee, NP  metolazone (ZAROXOLYN) 2.5 MG tablet Take 2.5 mg by mouth once a week.    [provider]  OXYGEN Inhale 2 L into the lungs continuous.     [provider]  pantoprazole (PROTONIX) 40 MG tablet Take 1 tablet (40 mg total) by mouth daily. 06/08/19   Gerlene Fee, NP  polyethylene glycol (MIRALAX / GLYCOLAX) 17 g packet Take 17 g by mouth daily. Patient not taking: Reported on 06/18/2019 05/26/19   Shelly Coss, MD  senna (SENOKOT) 8.6 MG TABS tablet Take 1 tablet (8.6 mg total) by mouth at bedtime. 05/25/19   Shelly Coss, MD  timolol (TIMOPTIC) 0.5 % ophthalmic solution Place 1 drop into both eyes at bedtime. 06/08/19   Gerlene Fee, NP    Family History Family History  Problem Relation Age of Onset   Dementia Mother    Coronary artery disease Mother    Cancer Father        blood ? type   Diabetes Maternal Grandfather    Anuerysm Daughter        brain   Colon cancer Neg Hx     Social History Social History   Tobacco Use   Smoking status: Former Smoker    Packs/day: 1.00    Years: 45.00    Pack years: 45.00    Types: Cigarettes    Quit date: 07/24/1978    Years since quitting: 40.9   Smokeless tobacco: Never Used  Substance Use Topics   Alcohol use: Yes    Alcohol/week: 7.0 standard drinks    Types: 7 Glasses of wine per week    Comment: every day   Drug use: No     Allergies   Hydrocodone, Percocet [oxycodone-acetaminophen], Eggs or egg-derived products, Mercurial derivatives, Penicillins, and Quinine derivatives   Review of Systems Review of Systems  Unable to perform ROS: Mental status change     Physical Exam Updated Vital Signs BP 105/64 (BP Location: Right Arm)    Pulse 60    Temp 98 F (36.7 C) (Oral)    Resp 16    SpO2 100%   Physical Exam Vitals signs and nursing note reviewed.  Constitutional:      Appearance:  She is well-developed.     Comments: Chronically ill-appearing  HENT:     Head: Normocephalic  and atraumatic.     Mouth/Throat:     Mouth: Mucous membranes are moist.  Eyes:     Conjunctiva/sclera: Conjunctivae normal.  Neck:     Musculoskeletal: Neck supple.  Cardiovascular:     Rate and Rhythm: Normal rate and regular rhythm.     Heart sounds: No murmur.  Pulmonary:     Effort: Pulmonary effort is normal. No respiratory distress.     Breath sounds: Normal breath sounds. No wheezing.  Abdominal:     General: There is no distension.     Palpations: Abdomen is soft.     Tenderness: There is no abdominal tenderness. There is no guarding or rebound.  Skin:    General: Skin is warm and dry.     Capillary Refill: Capillary refill takes less than 2 seconds.  Neurological:     Mental Status: She is alert.     Motor: Weakness present.     Comments: Alert and oriented to person and place.  Generalized weakness, patient is unable to participate in multiple parts of the neurologic exam due to confusion and encephalopathy.  Asterixis present.      ED Treatments / Results  Labs (all labs ordered are listed, but only abnormal results are displayed) Labs Reviewed  CBC - Abnormal; Notable for the following components:      Result Value   RDW 20.3 (*)    nRBC 0.5 (*)    All other components within normal limits  DIFFERENTIAL - Abnormal; Notable for the following components:   Lymphs Abs 0.5 (*)    All other components within normal limits  COMPREHENSIVE METABOLIC PANEL - Abnormal; Notable for the following components:   Sodium 129 (*)    Chloride 86 (*)    CO2 21 (*)    Glucose, Bld 128 (*)    BUN 74 (*)    Creatinine, Ser 2.30 (*)    Calcium 8.7 (*)    AST 88 (*)    Total Bilirubin 10.3 (*)    GFR calc non Af Amer 19 (*)    GFR calc Af Amer 22 (*)    Anion gap 22 (*)    All other components within normal limits  I-STAT CHEM 8, ED - Abnormal; Notable for the following  components:   Sodium 131 (*)    Potassium 3.3 (*)    Chloride 86 (*)    BUN 102 (*)    Creatinine, Ser 2.00 (*)    Glucose, Bld 127 (*)    Calcium, Ion 0.88 (*)    Hemoglobin 16.3 (*)    HCT 48.0 (*)    All other components within normal limits  CBG MONITORING, ED    EKG EKG Interpretation  Date/Time:  Wednesday July 11 2019 14:45:54 EST Ventricular Rate:  64 PR Interval:    QRS Duration: 178 QT Interval:  498 QTC Calculation: 513 R Axis:   -135 Text Interpretation: AV dual-paced rhythm with occasional Premature ventricular complexes Abnormal ECG No significant change since last tracing Confirmed by Deno Etienne [phone]) on 07/11/2019 4:31:27 PM   Radiology Ct Head Wo Contrast  Result Date: 07/11/2019 CLINICAL DATA:  85 year old female with focal neurologic deficit greater than 6 hours. Possible stroke. EXAM: CT HEAD WITHOUT CONTRAST TECHNIQUE: Contiguous axial images were obtained from the base of the skull through the vertex without intravenous contrast. COMPARISON:  Head CT dated 05/14/2019. FINDINGS: Brain: Mild-to-moderate age-related atrophy and chronic microvascular ischemic changes. There is no acute intracranial hemorrhage. No mass  effect or midline shift. No extra-axial fluid collection. Vascular: No hyperdense vessel or unexpected calcification. Skull: Normal. Negative for fracture or focal lesion. Sinuses/Orbits: No acute finding. Other: None IMPRESSION: 1. No acute intracranial hemorrhage. 2. Age-related atrophy and chronic microvascular ischemic changes. Electronically Signed   By: Anner Crete M.D.   On: 07/11/2019 16:27    Procedures Procedures (including critical care time)  Medications Ordered in ED Medications - No data to display   Initial Impression / Assessment and Plan / ED Course  I have reviewed the triage vital signs and the nursing notes.  Pertinent labs & imaging results that were available during my care of the patient were reviewed by me  and considered in my medical decision making (see chart for details).        Lexxie Winberg is a 85 y.o. female with a past medical history of CKD, CHF with reduced ejection fraction with last EF of 35 to 40% in November 2019, known generalized weakness presenting today for altered mental status and difficulty with words.  My exam, presentation is most consistent with encephalopathy of unknown etiology.  Labs and imaging sent for differential diagnosis of CVA (CT scan without contrast used as it has been greater than 24 hours since symptom onset), electrolyte abnormality, sepsis, UTI, worsening kidney function.  Patient's AST is mildly elevated, she has hyponatremia that appears to be similar to 1 month ago, elevated BUN of 74 with worsening creatinine function of 2.3 (last creatinine was 1.66 about 81-month ago).  Her BUN is rising compared to about 1 month ago and a repeat BUN is even higher at 102.  Neurology initially consulted for word difficulty finding, but upon discussion, presentation is most consistent with encephalopathy instead of CVA and CVA is unlikely considering the negative CT scan.  CT scan showed chronic ischemic changes but nothing acute such as large vessel disease. Considering findings, presentation is consistent with uremic encephalopathy. Nephrology consulted. Plan to admit to medicine for further evaluation and management considering patient is still not at baseline.  Care of patient discussed with the supervising attending.  Final Clinical Impressions(s) / ED Diagnoses   Final diagnoses:  Encephalopathy  Uremia  Chronic kidney disease, unspecified CKD stage    ED Discharge Orders    None       Julianne Rice, MD 07/11/19 2303    Deno Etienne, DO 07/11/19 [place], [place], DO 08/07/19 1832

## 2019-07-11 NOTE — ED Notes (Signed)
Hospital bed ordered.

## 2019-07-11 NOTE — ED Notes (Signed)
Pt placed in hospital bed for comfort. Bed alarm on

## 2019-07-11 NOTE — Consult Note (Addendum)
Reason for Consult: Renal failure  Referring Physician: Dr. Deno Etienne  Chief Complaint: Shortness of breath  Assessment/Plan: 1. Acute on CKD IV with BL Cr of  1.6-1.8 and presenting with Cr of 2.3 which has decreased bo 2 but BUN has continued to rise. Certainly has symptoms which can be c/w  uremic encephalopathy but unfortunately she is not a candidate for dialysis even in the acute setting. She almost certainly won't tolerate dialysis and also if started on dialysis will likely become dialysis dependent. She has very poor muscle mass and at age 85 she has CKD4 with a creatinine of 1.6-1.8. Her blood pressure is on the low side + poor functional status + age + comorbidities including advanced heart failure. I discussed this with the granddaughter who was bedside and advised medical management. - Will check a renal ultrasound to ensure she is not obstructed. - Check urine studies. - Hold the diuretics for now; will likely have to restart in the next 24-48hrs. - Leg stockings to help mobilize fluids, strict I&O's, daily weights. She was 66.2kg when she left the hospital on 04/54/09.  2. CHF - certainly positive Na balance and she likely has a very narrow window for therapeutic response. 3. Afib rate controlled on Eliquis 4. HTN 5. H/o resp failure with hypoxia req home O2    HPI: Kim Mullins is an 85 y.o. female HTN sCHF with EF 35-40% on Bumex 4mg  twice daily, afib on Eliquis, CVA, GERD, PM, OSA, home O2 with recent hospitalization for worsening edema and chronic hyponatremia. She also has a h/o of CKD IV with a baseline creatinine of 1.8-2.2. She has been evaluated by the palliative care team and has a visiting palliative nurse come to her home twice a week but they haven't made the decision to go the route of comfort care. According to the daughter she has had much worse swelling in the lower extremities but recently she has not been responding as well to the Bumex. We were asked to  see her bec of possible uremia from the worsening renal failure.   ROS Pertinent items are noted in HPI.  Chemistry and CBC: Creat  Date/Time Value Ref Range Status  12/29/2015 10:57 AM 0.98 (H) 0.60 - 0.88 mg/dL Final  09/12/2015 11:10 AM 1.07 (H) 0.60 - 0.88 mg/dL Final  10/21/2014 01:49 PM 1.15 (H) 0.50 - 1.10 mg/dL Final   Creatinine, Ser  Date/Time Value Ref Range Status  07/11/2019 03:13 PM 2.00 (H) 0.44 - 1.00 mg/dL Final  07/11/2019 02:37 PM 2.30 (H) 0.44 - 1.00 mg/dL Final  06/17/2019 09:59 AM 1.66 (H) 0.44 - 1.00 mg/dL Final  06/16/2019 04:08 PM 1.76 (H) 0.44 - 1.00 mg/dL Final    Comment:    ICTERUS AT THIS LEVEL MAY AFFECT RESULT  06/15/2019 01:53 PM 1.84 (H) 0.44 - 1.00 mg/dL Final  06/15/2019 04:16 AM 1.75 (H) 0.44 - 1.00 mg/dL Final  06/14/2019 06:55 AM 1.70 (H) 0.44 - 1.00 mg/dL Final  06/13/2019 05:20 AM 1.74 (H) 0.44 - 1.00 mg/dL Final  06/11/2019 01:27 PM 1.92 (H) 0.44 - 1.00 mg/dL Final  06/05/2019 07:15 AM 1.70 (H) 0.44 - 1.00 mg/dL Final  05/28/2019 06:30 AM 1.53 (H) 0.44 - 1.00 mg/dL Final  05/25/2019 03:30 AM 1.81 (H) 0.44 - 1.00 mg/dL Final  05/22/2019 03:24 AM 1.49 (H) 0.44 - 1.00 mg/dL Final  05/19/2019 04:37 AM 1.40 (H) 0.44 - 1.00 mg/dL Final  05/18/2019 02:30 AM 1.48 (H) 0.44 - 1.00 mg/dL Final  05/17/2019 04:08 AM 1.72 (H) 0.44 - 1.00 mg/dL Final  05/16/2019 04:17 AM 1.59 (H) 0.44 - 1.00 mg/dL Final  05/15/2019 05:16 AM 1.61 (H) 0.44 - 1.00 mg/dL Final  05/14/2019 09:12 PM 1.71 (H) 0.44 - 1.00 mg/dL Final  05/10/2019 01:35 PM 1.30 (H) 0.44 - 1.00 mg/dL Final  04/30/2019 02:20 PM 1.26 (H) 0.44 - 1.00 mg/dL Final  02/19/2019 12:19 PM 1.50 (H) 0.44 - 1.00 mg/dL Final  02/08/2019 12:43 PM 1.35 (H) 0.44 - 1.00 mg/dL Final  10/09/2018 12:38 PM 1.72 (H) 0.44 - 1.00 mg/dL Final  08/20/2018 05:46 AM 1.75 (H) 0.44 - 1.00 mg/dL Final  08/19/2018 04:40 AM 1.81 (H) 0.44 - 1.00 mg/dL Final  08/18/2018 07:01 AM 1.79 (H) 0.44 - 1.00 mg/dL Final  08/17/2018  01:32 PM 1.86 (H) 0.44 - 1.00 mg/dL Final  08/10/2018 12:23 PM 2.08 (H) 0.44 - 1.00 mg/dL Final  07/26/2018 05:45 AM 2.70 (H) 0.44 - 1.00 mg/dL Final  07/25/2018 02:00 PM 2.90 (H) 0.44 - 1.00 mg/dL Final  07/25/2018 05:46 AM 2.76 (H) 0.44 - 1.00 mg/dL Final  07/24/2018 04:47 AM 2.99 (H) 0.44 - 1.00 mg/dL Final  06-14-2018 04:08 AM 2.94 (H) 0.44 - 1.00 mg/dL Final  07/22/2018 06:06 AM 3.34 (H) 0.44 - 1.00 mg/dL Final  07/21/2018 05:21 AM 3.40 (H) 0.44 - 1.00 mg/dL Final  07/20/2018 04:59 AM 3.65 (H) 0.44 - 1.00 mg/dL Final  07/19/2018 03:10 AM 3.56 (H) 0.44 - 1.00 mg/dL Final  07/18/2018 05:21 PM 3.71 (H) 0.44 - 1.00 mg/dL Final  07/18/2018 03:19 PM 3.67 (H) 0.44 - 1.00 mg/dL Final  07/06/2018 01:56 PM 1.76 (H) 0.44 - 1.00 mg/dL Final  06/24/2018 08:38 PM 1.48 (H) 0.44 - 1.00 mg/dL Final  06/06/2018 02:05 PM 1.56 (H) 0.44 - 1.00 mg/dL Final  05/26/2018 11:21 AM 2.76 (H) 0.44 - 1.00 mg/dL Final  05/23/2018 02:45 PM 3.22 (H) 0.44 - 1.00 mg/dL Final  03/20/2018 02:13 PM 1.97 (H) 0.44 - 1.00 mg/dL Final  03/06/2018 01:56 PM 1.53 (H) 0.44 - 1.00 mg/dL Final  02/20/2018 12:41 PM 1.13 (H) 0.44 - 1.00 mg/dL Final  02/07/2018 11:36 AM 1.22 (H) 0.44 - 1.00 mg/dL Final  01/26/2018 10:56 AM 1.20 (H) 0.44 - 1.00 mg/dL Final  12/12/2017 01:51 PM 1.21 (H) 0.44 - 1.00 mg/dL Final  11/28/2017 09:25 AM 1.17 (H) 0.44 - 1.00 mg/dL Final   Recent Labs  Lab 07/11/19 1437 07/11/19 1513  NA 129* 131*  K 3.5 3.3*  CL 86* 86*  CO2 21*  --   GLUCOSE 128* 127*  BUN 74* 102*  CREATININE 2.30* 2.00*  CALCIUM 8.7*  --    Recent Labs  Lab 07/11/19 1437 07/11/19 1513  WBC 4.2  --   NEUTROABS 3.4  --   HGB 13.9 16.3*  HCT 41.2 48.0*  MCV 87.3  --   PLT 187  --    Liver Function Tests: Recent Labs  Lab 07/11/19 1437  AST 88*  ALT 16  ALKPHOS 92  BILITOT 10.3*  PROT 7.5  ALBUMIN 3.5   No results for input(s): LIPASE, AMYLASE in the last 168 hours. No results for input(s): AMMONIA in the last  168 hours. Cardiac Enzymes: No results for input(s): CKTOTAL, CKMB, CKMBINDEX, TROPONINI in the last 168 hours. Iron Studies: No results for input(s): IRON, TIBC, TRANSFERRIN, FERRITIN in the last 72 hours. PT/INR: @LABRCNTIP (inr:5)  Xrays/Other Studies: ) Results for orders placed or performed during the hospital encounter of 07/11/19 (from the past 48  hour(s))  CBC     Status: Abnormal   Collection Time: 07/11/19  2:37 PM  Result Value Ref Range   WBC 4.2 4.0 - 10.5 K/uL   RBC 4.72 3.87 - 5.11 MIL/uL   Hemoglobin 13.9 12.0 - 15.0 g/dL   HCT 41.2 36.0 - 46.0 %   MCV 87.3 80.0 - 100.0 fL   MCH 29.4 26.0 - 34.0 pg   MCHC 33.7 30.0 - 36.0 g/dL   RDW 20.3 (H) 11.5 - 15.5 %   Platelets 187 150 - 400 K/uL   nRBC 0.5 (H) 0.0 - 0.2 %    Comment: Performed at [facility] [address], [place], [place] [address]  Differential     Status: Abnormal   Collection Time: 07/11/19  2:37 PM  Result Value Ref Range   Neutrophils Relative % 80 %   Neutro Abs 3.4 1.7 - 7.7 K/uL   Lymphocytes Relative 12 %   Lymphs Abs 0.5 (L) 0.7 - 4.0 K/uL   Monocytes Relative 5 %   Monocytes Absolute 0.2 0.1 - 1.0 K/uL   Eosinophils Relative 1 %   Eosinophils Absolute 0.0 0.0 - 0.5 K/uL   Basophils Relative 1 %   Basophils Absolute 0.0 0.0 - 0.1 K/uL   Immature Granulocytes 1 %   Abs Immature Granulocytes 0.03 0.00 - 0.07 K/uL    Comment: Performed at [facility] [address] [place], [place] [address]  Comprehensive metabolic panel     Status: Abnormal   Collection Time: 07/11/19  2:37 PM  Result Value Ref Range   Sodium 129 (L) 135 - 145 mmol/L   Potassium 3.5 3.5 - 5.1 mmol/L    Comment: SLIGHT HEMOLYSIS ICTERIC SPECIMEN    Chloride 86 (L) 98 - 111 mmol/L   CO2 21 (L) 22 - 32 mmol/L   Glucose, Bld 128 (H) 70 - 99 mg/dL   BUN 74 (H) 8 - 23 mg/dL   Creatinine, Ser 2.30 (H) 0.44 - 1.00 mg/dL   Calcium 8.7 (L) 8.9 - 10.3 mg/dL   Total Protein 7.5 6.5 - 8.1 g/dL   Albumin 3.5  3.5 - 5.0 g/dL   AST 88 (H) 15 - 41 U/L   ALT 16 0 - 44 U/L   Alkaline Phosphatase 92 38 - 126 U/L   Total Bilirubin 10.3 (H) 0.3 - 1.2 mg/dL   GFR calc non Af Amer 19 (L) >60 mL/min   GFR calc Af Amer 22 (L) >60 mL/min   Anion gap 22 (H) 5 - 15    Comment: Performed at [facility], [place] [address] [place], [place] [address]  I-stat chem 8, ED     Status: Abnormal   Collection Time: 07/11/19  3:13 PM  Result Value Ref Range   Sodium 131 (L) 135 - 145 mmol/L   Potassium 3.3 (L) 3.5 - 5.1 mmol/L   Chloride 86 (L) 98 - 111 mmol/L   BUN 102 (H) 8 - 23 mg/dL   Creatinine, Ser 2.00 (H) 0.44 - 1.00 mg/dL   Glucose, Bld 127 (H) 70 - 99 mg/dL   Calcium, Ion 0.88 (LL) 1.15 - 1.40 mmol/L   TCO2 27 22 - 32 mmol/L   Hemoglobin 16.3 (H) 12.0 - 15.0 g/dL   HCT 48.0 (H) 36.0 - 46.0 %   Comment NOTIFIED PHYSICIAN   Urinalysis, Routine w reflex microscopic     Status: None   Collection Time: 07/11/19  5:30 PM  Result Value Ref Range   Color, Urine YELLOW YELLOW   APPearance CLEAR CLEAR   Specific Gravity, Urine 1.006 1.005 - 1.030   pH 7.0 5.0 - 8.0   Glucose, UA NEGATIVE NEGATIVE mg/dL   Hgb urine dipstick NEGATIVE NEGATIVE   Bilirubin Urine NEGATIVE NEGATIVE   Ketones, ur NEGATIVE NEGATIVE mg/dL   Protein, ur NEGATIVE NEGATIVE mg/dL   Nitrite NEGATIVE NEGATIVE   Leukocytes,Ua NEGATIVE NEGATIVE    Comment: Performed at [facility] [address] [place], [place] [address]   Ct Head Wo Contrast  Result Date: 07/11/2019 CLINICAL DATA:  85 year old female with focal neurologic deficit greater than 6 hours. Possible stroke. EXAM: CT HEAD WITHOUT CONTRAST TECHNIQUE: Contiguous axial images were obtained from the base of the skull through the vertex without intravenous contrast. COMPARISON:  Head CT dated 05/14/2019. FINDINGS: Brain: Mild-to-moderate age-related atrophy and chronic microvascular ischemic changes. There is no acute intracranial hemorrhage. No mass effect or midline  shift. No extra-axial fluid collection. Vascular: No hyperdense vessel or unexpected calcification. Skull: Normal. Negative for fracture or focal lesion. Sinuses/Orbits: No acute finding. Other: None IMPRESSION: 1. No acute intracranial hemorrhage. 2. Age-related atrophy and chronic microvascular ischemic changes. Electronically Signed   By: Anner Crete M.D.   On: 07/11/2019 16:27    PMH:   Past Medical History:  Diagnosis Date  . Anemia    occassionally  . Arthritis    all over.  . Asthma    Allergixc reaction to cats.  . Atrial fibrillation ([place])   . Bowel obstruction ([place])   . Chronic systolic CHF (congestive heart failure) (H[facility])    EF 30-35% by cath, echo 2016 EF 50%  . Complete heart block (H[facility])    a. s/p MDT CRTP pacemaker  . DCM (dilated cardiomyopathy) ([place]) 08/16/2014   normal coronary arteries on cath with EF 30-45%.  EF now 50% by echo 11/2014  . Diabetes mellitus 2006   Diet and exercise controlled.  Marland Kitchen Dyspnea   . GERD (gastroesophageal reflux disease)    occ  . Glaucoma 08/17/2018  . Hypertension   . Left tibial fracture 2007  . OSA (obstructive sleep apnea) 10/23/2014   Moderate with AHI 21/hr  . Osteoarthritis of right shoulder region 06/26/2013  . Stroke [facility])     PSH:   Past Surgical History:  Procedure Laterality Date  . ABDOMINAL HYSTERECTOMY  1969  . BI-VENTRICULAR PACEMAKER INSERTION N/A 10/07/2014   MDT CRTP implanted by Dr Lovena Le  . BRAVO [place] STUDY N/A 06/11/2014   Procedure: BRAVO [place] STUDY;  Surgeon: Inda Castle, MD;  Location: [facility] ENDOSCOPY;  Service: Endoscopy;  Laterality: N/A;  . BUNIONECTOMY  1984   Bilateral  . CARDIAC CATHETERIZATION     normal coronary arteries  . Carpal [place]  2004   Bilateral  . CATARACT EXTRACTION Right    early 2015  . corn removal  1999   Bilateral feet  . DILATION AND CURETTAGE OF UTERUS  1968  . ESOPHAGOGASTRODUODENOSCOPY (EGD) WITH PROPOFOL N/A 06/11/2014   Procedure: ESOPHAGOGASTRODUODENOSCOPY (EGD)  WITH PROPOFOL;  Surgeon: Inda Castle, MD;  Location: [facility] ENDOSCOPY;  Service: Endoscopy;  Laterality: N/A;  . [place]   Surgery to fix Retainal detachment, bilateral  . JOINT REPLACEMENT    . LEFT AND RIGHT HEART CATHETERIZATION WITH CORONARY ANGIOGRAM N/A 08/29/2014   Procedure: LEFT AND RIGHT HEART CATHETERIZATION WITH CORONARY ANGIOGRAM;  Surgeon: Peter M Martinique, MD;  Location: [facility];  Service: Cardiovascular;  Laterality: N/A;  . MALONEY DILATION  06/11/2014   Procedure: Venia Minks DILATION;  Surgeon: Inda Castle, MD;  Location: [facility] ENDOSCOPY;  Service: Endoscopy;;  . PILONIDAL CYST EXCISION  1959  . RIGHT HEART CATH N/A 07/24/2018   Procedure: RIGHT HEART CATH;  Surgeon: Larey Dresser, MD;  Location: [facility] CV LAB;  Service: Cardiovascular;  Laterality: N/A;  . TONSILLECTOMY  1942  . TOTAL HIP ARTHROPLASTY  11/16/2011   Procedure: TOTAL HIP ARTHROPLASTY ANTERIOR APPROACH;  Surgeon: Hessie Dibble, MD;  Location: [facility];  Service: Orthopedics;  Laterality: Right;  DEPUY  . TOTAL SHOULDER ARTHROPLASTY Right 06/26/2013   Procedure: TOTAL SHOULDER ARTHROPLASTY;  Surgeon: Johnny Bridge, MD;  Location: [facility];  Service: Orthopedics;  Laterality: Right;    Allergies:  Allergies  Allergen Reactions  . Hydrocodone Other (See Comments)    Took away all energy and made her stop eating food for short time  . Percocet [Oxycodone-Acetaminophen] Other (See Comments)    Too away all energy and made her stop eating food for short time  . Eggs Or Egg-Derived Products Hives and Rash  . Mercurial Derivatives Itching and Rash  . Penicillins Hives and Rash    Has patient had a PCN reaction causing immediate rash, facial/tongue/throat swelling, SOB or lightheadedness with hypotension: Yes Has patient had a PCN reaction causing severe rash involving mucus membranes or skin necrosis: No Has patient had a PCN reaction that required hospitalization: No Has patient had a PCN  reaction occurring within the last 10 years: Yes If all of the above answers are "NO", then may proceed with Cephalosporin use.  . Quinine Derivatives Hives and Rash    Medications:   Prior to Admission medications   Medication Sig Start Date End Date Taking? Authorizing Provider  Acetaminophen (TYLENOL) 325 MG CAPS Take 650 mg by mouth daily as needed (for shoulder pain).     [provider]  apixaban (ELIQUIS) 2.5 MG TABS tablet Take 1 tablet (2.5 mg total) by mouth 2 (two) times daily. 06/08/19   Gerlene Fee, NP  bumetanide (BUMEX) 2 MG tablet Take 2 tablets (4 mg total) by mouth 2 (two) times daily. 06/08/19   Gerlene Fee, NP  diclofenac sodium (VOLTAREN) 1 % GEL Apply 2 g topically 3 (three) times daily as needed (pain, inflammation). Patient not taking: Reported on 06/25/2019 06/08/19   Gerlene Fee, NP  fexofenadine (ALLEGRA) 180 MG tablet Take 1 tablet (180 mg total) by mouth daily. 06/08/19   Gerlene Fee, NP  glucose blood test strip Use to test blood sugar twice daily ([employer] Smartview) Patient not taking: Reported on 06/25/2019 07/19/16   Larey Dresser, MD  hydrocortisone cream 1 % Apply topically daily as needed for itching. Cream; amt: sparingly; topical Special Instructions: apply to itchy skin Once A Day - PRN PRN 1 Patient not taking: Reported on 06/25/2019 06/08/19   Gerlene Fee, NP  latanoprost (XALATAN) 0.005 % ophthalmic solution Place 1 drop into both eyes at bedtime. 06/08/19   Gerlene Fee, NP  metolazone (ZAROXOLYN) 2.5 MG tablet Take 2.5 mg by mouth once a week.    [provider]  OXYGEN Inhale 2 L into the lungs continuous.     [provider]  pantoprazole (PROTONIX) 40 MG tablet Take 1 tablet (40 mg total) by mouth daily. 06/08/19   Gerlene Fee, NP  polyethylene glycol (MIRALAX / GLYCOLAX) 17 g packet Take  17 g by mouth daily. Patient not taking: Reported on 06/18/2019 05/26/19   Shelly Coss, MD  senna  (SENOKOT) 8.6 MG TABS tablet Take 1 tablet (8.6 mg total) by mouth at bedtime. 05/25/19   Shelly Coss, MD  timolol (TIMOPTIC) 0.5 % ophthalmic solution Place 1 drop into both eyes at bedtime. 06/08/19   Gerlene Fee, NP    Discontinued Meds:  There are no discontinued medications.  Social History:  reports that she quit smoking about 40 years ago. Her smoking use included cigarettes. She has a 45.00 pack-year smoking history. She has never used smokeless tobacco. She reports current alcohol use of about 7.0 standard drinks of alcohol per week. She reports that she does not use drugs.  Family History:   Family History  Problem Relation Age of Onset  . Dementia Mother   . Coronary artery disease Mother   . Cancer Father        blood ? type  . Diabetes Maternal Grandfather   . Anuerysm Daughter        brain  . Colon cancer Neg Hx     Blood pressure 103/81, pulse 60, temperature 98 F (36.7 C), temperature source Oral, resp. rate 20, SpO2 100 %. General appearance: Oriented to date and place, lethargic Head: Normocephalic, without obvious abnormality, atraumatic Eyes: negative Neck: no adenopathy, no carotid bruit, supple, symmetrical, trachea midline and thyroid not enlarged, symmetric, no tenderness/mass/nodules Back: symmetric, no curvature. ROM normal. No CVA tenderness. Resp: poor effort Cardio: irregularly irregular rhythm GI: soft, non-tender; bowel sounds normal; no masses,  no organomegaly Extremities: edema 2+ Pulses: 2+ and symmetric Skin: Skin color, texture, turgor normal. No rashes or lesions Lymph nodes: Cervical adenopathy: none       Marquie Aderhold, Hunt Oris, MD 07/11/2019, 8:16 PM

## 2019-07-11 NOTE — ED Triage Notes (Signed)
Per [facility], pt from home where she is a hospice pt due to CHF, LSN yesterday at 1500. Family reports that pt was not answering  Questions appropriately today so they called 911. VSS. Alert and oriented to self. Not answering other questions appropriately. Slow responses.

## 2019-07-11 NOTE — ED Notes (Signed)
BLue tube for pt inr is hemolyzed per lab. Needs recollect

## 2019-07-12 DIAGNOSIS — I420 Dilated cardiomyopathy: Secondary | ICD-10-CM

## 2019-07-12 DIAGNOSIS — N189 Chronic kidney disease, unspecified: Secondary | ICD-10-CM

## 2019-07-12 DIAGNOSIS — G9340 Encephalopathy, unspecified: Secondary | ICD-10-CM

## 2019-07-12 DIAGNOSIS — I4811 Longstanding persistent atrial fibrillation: Secondary | ICD-10-CM

## 2019-07-12 DIAGNOSIS — N185 Chronic kidney disease, stage 5: Secondary | ICD-10-CM

## 2019-07-12 DIAGNOSIS — N179 Acute kidney failure, unspecified: Secondary | ICD-10-CM

## 2019-07-12 DIAGNOSIS — I10 Essential (primary) hypertension: Secondary | ICD-10-CM

## 2019-07-12 DIAGNOSIS — I5043 Acute on chronic combined systolic (congestive) and diastolic (congestive) heart failure: Secondary | ICD-10-CM

## 2019-07-12 DIAGNOSIS — I272 Pulmonary hypertension, unspecified: Secondary | ICD-10-CM

## 2019-07-12 DIAGNOSIS — G4733 Obstructive sleep apnea (adult) (pediatric): Secondary | ICD-10-CM

## 2019-07-12 DIAGNOSIS — I480 Paroxysmal atrial fibrillation: Secondary | ICD-10-CM

## 2019-07-12 DIAGNOSIS — Z950 Presence of cardiac pacemaker: Secondary | ICD-10-CM

## 2019-07-12 DIAGNOSIS — Z8673 Personal history of transient ischemic attack (TIA), and cerebral infarction without residual deficits: Secondary | ICD-10-CM

## 2019-07-12 LAB — CBC
HCT: 40.6 % (ref 36.0–46.0)
Hemoglobin: 13.2 g/dL (ref 12.0–15.0)
MCH: 29.1 pg (ref 26.0–34.0)
MCHC: 32.5 g/dL (ref 30.0–36.0)
MCV: 89.4 fL (ref 80.0–100.0)
Platelets: 158 10*3/uL (ref 150–400)
RBC: 4.54 MIL/uL (ref 3.87–5.11)
RDW: 20.2 % — ABNORMAL HIGH (ref 11.5–15.5)
WBC: 3.7 10*3/uL — ABNORMAL LOW (ref 4.0–10.5)
nRBC: 0 % (ref 0.0–0.2)

## 2019-07-12 LAB — COMPREHENSIVE METABOLIC PANEL
ALT: 16 U/L (ref 0–44)
AST: 39 U/L (ref 15–41)
Albumin: 3.1 g/dL — ABNORMAL LOW (ref 3.5–5.0)
Alkaline Phosphatase: 77 U/L (ref 38–126)
Anion gap: 20 — ABNORMAL HIGH (ref 5–15)
BUN: 74 mg/dL — ABNORMAL HIGH (ref 8–23)
CO2: 24 mmol/L (ref 22–32)
Calcium: 9 mg/dL (ref 8.9–10.3)
Chloride: 91 mmol/L — ABNORMAL LOW (ref 98–111)
Creatinine, Ser: 2.16 mg/dL — ABNORMAL HIGH (ref 0.44–1.00)
GFR calc Af Amer: 23 mL/min — ABNORMAL LOW (ref >60)
GFR calc non Af Amer: 20 mL/min — ABNORMAL LOW (ref >60)
Glucose, Bld: 82 mg/dL (ref 70–99)
Potassium: 2.2 mmol/L — CL (ref 3.5–5.1)
Sodium: 135 mmol/L (ref 135–145)
Total Bilirubin: 8.5 mg/dL — ABNORMAL HIGH (ref 0.3–1.2)
Total Protein: 6.9 g/dL (ref 6.5–8.1)

## 2019-07-12 LAB — SARS CORONAVIRUS 2 (TAT 6-24 HRS): SARS Coronavirus 2: NEGATIVE

## 2019-07-12 LAB — MAGNESIUM: Magnesium: 2.4 mg/dL (ref 1.7–2.4)

## 2019-07-12 LAB — TSH: TSH: 3.019 u[IU]/mL (ref 0.350–4.500)

## 2019-07-12 LAB — AMMONIA: Ammonia: 95 umol/L — ABNORMAL HIGH (ref 9–35)

## 2019-07-12 LAB — CK: Total CK: 68 U/L (ref 38–234)

## 2019-07-12 LAB — PHOSPHORUS
Phosphorus: 4.6 mg/dL (ref 2.5–4.6)
Phosphorus: 4.6 mg/dL (ref 2.5–4.6)

## 2019-07-12 LAB — MRSA PCR SCREENING: MRSA by PCR: POSITIVE — AB

## 2019-07-12 LAB — BRAIN NATRIURETIC PEPTIDE: B Natriuretic Peptide: 1753.6 pg/mL — ABNORMAL HIGH (ref 0.0–100.0)

## 2019-07-12 MED ORDER — MUPIROCIN 2 % EX OINT
1.0000 "application " | TOPICAL_OINTMENT | Freq: Two times a day (BID) | CUTANEOUS | Status: DC
  Administered 2019-07-12 – 2019-07-15 (×6): 1 via NASAL
  Filled 2019-07-12 (×2): qty 22

## 2019-07-12 MED ORDER — POTASSIUM CHLORIDE 10 MEQ/100ML IV SOLN
10.0000 meq | INTRAVENOUS | Status: AC
  Administered 2019-07-12 (×4): 10 meq via INTRAVENOUS
  Filled 2019-07-12 (×4): qty 100

## 2019-07-12 MED ORDER — CHLORHEXIDINE GLUCONATE CLOTH 2 % EX PADS
6.0000 | MEDICATED_PAD | Freq: Every day | CUTANEOUS | Status: DC
  Administered 2019-07-13 – 2019-07-15 (×3): 6 via TOPICAL

## 2019-07-12 MED ORDER — LACTULOSE 10 GM/15ML PO SOLN
10.0000 g | Freq: Three times a day (TID) | ORAL | Status: DC
  Administered 2019-07-12 – 2019-07-15 (×9): 10 g via ORAL
  Filled 2019-07-12 (×9): qty 15

## 2019-07-12 NOTE — ED Notes (Signed)
ED TO INPATIENT HANDOFF REPORT  ED Nurse Name and Phone #: Jinny Blossom, #[phone]  S Name/Age/Gender Kim Mullins 85 y.o. female Room/Bed: 017C/017C  Code Status   Code Status: Full Code  Home/SNF/Other Home Patient oriented to: self and place Is this baseline? Yes   Triage Complete: Triage complete  Chief Complaint Stroke Symptoms  Triage Note Per [facility], pt from home where she is a hospice pt due to CHF, LSN yesterday at 1500. Family reports that pt was not answering  Questions appropriately today so they called 911. VSS. Alert and oriented to self. Not answering other questions appropriately. Slow responses.    Allergies Allergies  Allergen Reactions  . Hydrocodone Other (See Comments)    Took away all energy and made her stop eating food for short time  . Percocet [Oxycodone-Acetaminophen] Other (See Comments)    Too away all energy and made her stop eating food for short time  . Eggs Or Egg-Derived Products Hives and Rash  . Mercurial Derivatives Itching and Rash  . Penicillins Hives and Rash    Has patient had a PCN reaction causing immediate rash, facial/tongue/throat swelling, SOB or lightheadedness with hypotension: Yes Has patient had a PCN reaction causing severe rash involving mucus membranes or skin necrosis: No Has patient had a PCN reaction that required hospitalization: No Has patient had a PCN reaction occurring within the last 10 years: Yes If all of the above answers are "NO", then may proceed with Cephalosporin use.  . Quinine Derivatives Hives and Rash    Level of Care/Admitting Diagnosis ED Disposition    ED Disposition Condition [facility] Area: [place] [[id number]]  Level of Care: Progressive [102]  Admit to Progressive based on following criteria: NEUROLOGICAL AND NEUROSURGICAL complex patients with significant risk of instability, who do not meet ICU criteria, yet require close observation or frequent assessment  (< / = every 2 - 4 hours) with medical / nursing intervention.  Covid Evaluation: Asymptomatic Screening Protocol (No Symptoms)  Diagnosis: Acute encephalopathy [696789]  Admitting Physician: Assunta Found [[id number]]  Attending Physician: Assunta Found [[id number]]  Estimated length of stay: 3 - 4 days  Certification:: I certify this patient will need inpatient services for at least 2 midnights  PT Class (Do Not Modify): Inpatient [101]  PT Acc Code (Do Not Modify): Private [1]       B Medical/Surgery History Past Medical History:  Diagnosis Date  . Anemia    occassionally  . Arthritis    all over.  . Asthma    Allergixc reaction to cats.  . Atrial fibrillation ([place])   . Bowel obstruction ([place])   . Chronic systolic CHF (congestive heart failure) (HCC)    EF 30-35% by cath, echo 2016 EF 50%  . Complete heart block (HCC)    a. s/p MDT CRTP pacemaker  . DCM (dilated cardiomyopathy) ([place]) 08/16/2014   normal coronary arteries on cath with EF 30-45%.  EF now 50% by echo 11/2014  . Diabetes mellitus 2006   Diet and exercise controlled.  Marland Kitchen Dyspnea   . GERD (gastroesophageal reflux disease)    occ  . Glaucoma 08/17/2018  . Hypertension   . Left tibial fracture 2007  . OSA (obstructive sleep apnea) 10/23/2014   Moderate with AHI 21/hr  . Osteoarthritis of right shoulder region 06/26/2013  . Stroke [place])    Past Surgical History:  Procedure Laterality Date  . ABDOMINAL HYSTERECTOMY  1969  .  BI-VENTRICULAR PACEMAKER INSERTION N/A 10/07/2014   MDT CRTP implanted by Dr Lovena Le  . BRAVO [place] STUDY N/A 06/11/2014   Procedure: BRAVO [place] STUDY;  Surgeon: Inda Castle, MD;  Location: [facility] ENDOSCOPY;  Service: Endoscopy;  Laterality: N/A;  . BUNIONECTOMY  1984   Bilateral  . CARDIAC CATHETERIZATION     normal coronary arteries  . Carpal [place]  2004   Bilateral  . CATARACT EXTRACTION Right    early 2015  . corn removal  1999   Bilateral feet  . DILATION AND CURETTAGE OF  UTERUS  1968  . ESOPHAGOGASTRODUODENOSCOPY (EGD) WITH PROPOFOL N/A 06/11/2014   Procedure: ESOPHAGOGASTRODUODENOSCOPY (EGD) WITH PROPOFOL;  Surgeon: Inda Castle, MD;  Location: [facility];  Service: Endoscopy;  Laterality: N/A;  . [place]   Surgery to fix Retainal detachment, bilateral  . JOINT REPLACEMENT    . LEFT AND RIGHT HEART CATHETERIZATION WITH CORONARY ANGIOGRAM N/A 08/29/2014   Procedure: LEFT AND RIGHT HEART CATHETERIZATION WITH CORONARY ANGIOGRAM;  Surgeon: Peter M Martinique, MD;  Location: [facility] CATH LAB;  Service: Cardiovascular;  Laterality: N/A;  . MALONEY DILATION  06/11/2014   Procedure: Venia Minks DILATION;  Surgeon: Inda Castle, MD;  Location: [facility] ENDOS[facility];  Service: Endoscopy;;  . PILONIDAL CYST EXCISION  1959  . RIGHT HEART CATH N/A 07/24/2018   Procedure: RIGHT HEART CATH;  Surgeon: Larey Dresser, MD;  Location: [facility] CV LAB;  Service: Cardiovascular;  Laterality: N/A;  . TONSILLECTOMY  1942  . TOTAL HIP ARTHROPLASTY  11/16/2011   Procedure: TOTAL HIP ARTHROPLASTY ANTERIOR APPROACH;  Surgeon: Hessie Dibble, MD;  Location: [facility];  Service: Orthopedics;  Laterality: Right;  DEPUY  . TOTAL SHOULDER ARTHROPLASTY Right 06/26/2013   Procedure: TOTAL SHOULDER ARTHROPLASTY;  Surgeon: Johnny Bridge, MD;  Location: [facility];  Service: Orthopedics;  Laterality: Right;     A IV Location/Drains/Wounds Patient Lines/Drains/Airways Status   Active Line/Drains/Airways    Name:   Placement date:   Placement time:   Site:   Days:   Peripheral IV 07/11/19 Left Antecubital   07/11/19    1452    Antecubital   1          Intake/Output Last 24 hours  Intake/Output Summary (Last 24 hours) at 07/12/2019 0456 Last data filed at 07/12/2019 0420 Gross per 24 hour  Intake -  Output 925 ml  Net -925 ml    Labs/Imaging Results for orders placed or performed during the hospital encounter of 07/11/19 (from the past 48 hour(s))  CBC     Status: Abnormal    Collection Time: 07/11/19  2:37 PM  Result Value Ref Range   WBC 4.2 4.0 - 10.5 K/uL   RBC 4.72 3.87 - 5.11 MIL/uL   Hemoglobin 13.9 12.0 - 15.0 g/dL   HCT 41.2 36.0 - 46.0 %   MCV 87.3 80.0 - 100.0 fL   MCH 29.4 26.0 - 34.0 pg   MCHC 33.7 30.0 - 36.0 g/dL   RDW 20.3 (H) 11.5 - 15.5 %   Platelets 187 150 - 400 K/uL   nRBC 0.5 (H) 0.0 - 0.2 %    Comment: Performed at [facility], [place] [address], [place], [place] [address]  Differential     Status: Abnormal   Collection Time: 07/11/19  2:37 PM  Result Value Ref Range   Neutrophils Relative % 80 %   Neutro Abs 3.4 1.7 - 7.7 K/uL   Lymphocytes Relative 12 %  Lymphs Abs 0.5 (L) 0.7 - 4.0 K/uL   Monocytes Relative 5 %   Monocytes Absolute 0.2 0.1 - 1.0 K/uL   Eosinophils Relative 1 %   Eosinophils Absolute 0.0 0.0 - 0.5 K/uL   Basophils Relative 1 %   Basophils Absolute 0.0 0.0 - 0.1 K/uL   Immature Granulocytes 1 %   Abs Immature Granulocytes 0.03 0.00 - 0.07 K/uL    Comment: Performed at [facility] [address] [place], [place] [address]  Comprehensive metabolic panel     Status: Abnormal   Collection Time: 07/11/19  2:37 PM  Result Value Ref Range   Sodium 129 (L) 135 - 145 mmol/L   Potassium 3.5 3.5 - 5.1 mmol/L    Comment: SLIGHT HEMOLYSIS ICTERIC SPECIMEN    Chloride 86 (L) 98 - 111 mmol/L   CO2 21 (L) 22 - 32 mmol/L   Glucose, Bld 128 (H) 70 - 99 mg/dL   BUN 74 (H) 8 - 23 mg/dL   Creatinine, Ser 2.30 (H) 0.44 - 1.00 mg/dL   Calcium 8.7 (L) 8.9 - 10.3 mg/dL   Total Protein 7.5 6.5 - 8.1 g/dL   Albumin 3.5 3.5 - 5.0 g/dL   AST 88 (H) 15 - 41 U/L   ALT 16 0 - 44 U/L   Alkaline Phosphatase 92 38 - 126 U/L   Total Bilirubin 10.3 (H) 0.3 - 1.2 mg/dL   GFR calc non Af Amer 19 (L) >60 mL/min   GFR calc Af Amer 22 (L) >60 mL/min   Anion gap 22 (H) 5 - 15    Comment: Performed at [facility], [place] [address] [place], [place] [address]  Ginger Carne 8, ED     Status: Abnormal   Collection Time:  07/11/19  3:13 PM  Result Value Ref Range   Sodium 131 (L) 135 - 145 mmol/L   Potassium 3.3 (L) 3.5 - 5.1 mmol/L   Chloride 86 (L) 98 - 111 mmol/L   BUN 102 (H) 8 - 23 mg/dL   Creatinine, Ser 2.00 (H) 0.44 - 1.00 mg/dL   Glucose, Bld 127 (H) 70 - 99 mg/dL   Calcium, Ion 0.88 (LL) 1.15 - 1.40 mmol/L   TCO2 27 22 - 32 mmol/L   Hemoglobin 16.3 (H) 12.0 - 15.0 g/dL   HCT 48.0 (H) 36.0 - 46.0 %   Comment NOTIFIED PHYSICIAN   Urinalysis, Routine w reflex microscopic     Status: None   Collection Time: 07/11/19  5:30 PM  Result Value Ref Range   Color, Urine YELLOW YELLOW   APPearance CLEAR CLEAR   Specific Gravity, Urine 1.006 1.005 - 1.030   pH 7.0 5.0 - 8.0   Glucose, UA NEGATIVE NEGATIVE mg/dL   Hgb urine dipstick NEGATIVE NEGATIVE   Bilirubin Urine NEGATIVE NEGATIVE   Ketones, ur NEGATIVE NEGATIVE mg/dL   Protein, ur NEGATIVE NEGATIVE mg/dL   Nitrite NEGATIVE NEGATIVE   Leukocytes,Ua NEGATIVE NEGATIVE    Comment: Performed at [facility], [place] [address], [place], [place] [address]  SARS CORONAVIRUS 2 (TAT 6-24 HRS) Nasopharyngeal Nasopharyngeal Swab     Status: None   Collection Time: 07/11/19  7:22 PM   Specimen: Nasopharyngeal Swab  Result Value Ref Range   SARS Coronavirus 2 NEGATIVE NEGATIVE    Comment: (NOTE) SARS-CoV-2 target nucleic acids are NOT DETECTED. The SARS-CoV-2 RNA is generally detectable in upper and lower respiratory specimens during the acute phase of infection. Negative results  do not preclude SARS-CoV-2 infection, do not rule out co-infections with other pathogens, and should not be used as the sole basis for treatment or other patient management decisions. Negative results must be combined with clinical observations, patient history, and epidemiological information. The expected result is Negative. Fact Sheet for Patients: SugarRoll.be Fact Sheet for Healthcare  Providers: https://www.woods-mathews.com/ This test is not yet approved or cleared by the [place] FDA and  has been authorized for detection and/or diagnosis of SARS-CoV-2 by FDA under an Emergency Use Authorization (EUA). This EUA will remain  in effect (meaning this test can be used) for the duration of the COVID-19 declaration under Section 56 4(b)(1) of the Act, 21 U.S.C. section 360bbb-3(b)(1), unless the authorization is terminated or revoked sooner. Performed at [facility], [place] [address], [place], [place] [address]   Sodium, urine, random     Status: None   Collection Time: 07/11/19  8:36 PM  Result Value Ref Range   Sodium, Ur 67 mmol/L    Comment: Performed at [facility] [address] [place], [place] [address]  Creatinine, urine, random     Status: None   Collection Time: 07/11/19  8:36 PM  Result Value Ref Range   Creatinine, Urine 45.50 mg/dL    Comment: Performed at [facility] [address] [place], [place] [address]  CBG monitoring, ED     Status: None   Collection Time: 07/11/19  9:42 PM  Result Value Ref Range   Glucose-Capillary 98 70 - 99 mg/dL  CK     Status: None   Collection Time: 07/11/19 11:22 PM  Result Value Ref Range   Total CK 68 38 - 234 U/L    Comment: Performed at [facility], [place] [address], [place], [place] [address]  Phosphorus     Status: None   Collection Time: 07/11/19 11:22 PM  Result Value Ref Range   Phosphorus 4.6 2.5 - 4.6 mg/dL    Comment: Performed at [facility] [address] [place], [place] [address]  TSH     Status: None   Collection Time: 07/11/19 11:22 PM  Result Value Ref Range   TSH 3.019 0.350 - 4.500 uIU/mL    Comment: Performed by a 3rd Generation assay with a functional sensitivity of <=0.01 uIU/mL. Performed at [facility], [place] [address], [place], [place] [address]   Brain natriuretic peptide     Status: Abnormal   Collection Time: 07/11/19  11:22 PM  Result Value Ref Range   B Natriuretic Peptide 1,753.6 (H) 0.0 - 100.0 pg/mL    Comment: Performed at [facility] [address], [place], [place] [address]  Comprehensive metabolic panel     Status: Abnormal   Collection Time: 07/12/19  3:32 AM  Result Value Ref Range   Sodium 135 135 - 145 mmol/L   Potassium 2.2 (LL) 3.5 - 5.1 mmol/L    Comment: CRITICAL RESULT CALLED TO, READ BACK BY AND VERIFIED WITH: M.Laken Rog,RN 0454 07/12/19 G.MCADOO    Chloride 91 (L) 98 - 111 mmol/L   CO2 24 22 - 32 mmol/L   Glucose, Bld 82 70 - 99 mg/dL   BUN 74 (H) 8 - 23 mg/dL   Creatinine, Ser 2.16 (H) 0.44 - 1.00 mg/dL   Calcium 9.0 8.9 - 10.3 mg/dL   Total Protein 6.9 6.5 - 8.1 g/dL   Albumin 3.1 (L) 3.5 - 5.0 g/dL  AST 39 15 - 41 U/L   ALT 16 0 - 44 U/L   Alkaline Phosphatase 77 38 - 126 U/L   Total Bilirubin 8.5 (H) 0.3 - 1.2 mg/dL   GFR calc non Af Amer 20 (L) >60 mL/min   GFR calc Af Amer 23 (L) >60 mL/min   Anion gap 20 (H) 5 - 15    Comment: Performed at [facility] [address] [place], [place] [address]  Magnesium     Status: None   Collection Time: 07/12/19  3:32 AM  Result Value Ref Range   Magnesium 2.4 1.7 - 2.4 mg/dL    Comment: Performed at [facility] [address] [place], [place] [address]  Phosphorus     Status: None   Collection Time: 07/12/19  3:32 AM  Result Value Ref Range   Phosphorus 4.6 2.5 - 4.6 mg/dL    Comment: Performed at [facility] [address], [place], [place] [address]  CBC     Status: Abnormal   Collection Time: 07/12/19  3:32 AM  Result Value Ref Range   WBC 3.7 (L) 4.0 - 10.5 K/uL   RBC 4.54 3.87 - 5.11 MIL/uL   Hemoglobin 13.2 12.0 - 15.0 g/dL   HCT 40.6 36.0 - 46.0 %   MCV 89.4 80.0 - 100.0 fL   MCH 29.1 26.0 - 34.0 pg   MCHC 32.5 30.0 - 36.0 g/dL   RDW 20.2 (H) 11.5 - 15.5 %   Platelets 158 150 - 400 K/uL   nRBC 0.0 0.0 - 0.2 %    Comment: Performed at [facility], [place] [address]  [place], [place] [address]   Ct Head Wo Contrast  Result Date: 07/11/2019 CLINICAL DATA:  85 year old female with focal neurologic deficit greater than 6 hours. Possible stroke. EXAM: CT HEAD WITHOUT CONTRAST TECHNIQUE: Contiguous axial images were obtained from the base of the skull through the vertex without intravenous contrast. COMPARISON:  Head CT dated 05/14/2019. FINDINGS: Brain: Mild-to-moderate age-related atrophy and chronic microvascular ischemic changes. There is no acute intracranial hemorrhage. No mass effect or midline shift. No extra-axial fluid collection. Vascular: No hyperdense vessel or unexpected calcification. Skull: Normal. Negative for fracture or focal lesion. Sinuses/Orbits: No acute finding. Other: None IMPRESSION: 1. No acute intracranial hemorrhage. 2. Age-related atrophy and chronic microvascular ischemic changes. Electronically Signed   By: Anner Crete M.D.   On: 07/11/2019 16:27   US Renal  Result Date: 07/11/2019 CLINICAL DATA:  Acute kidney injury EXAM: RENAL / URINARY TRACT ULTRASOUND COMPLETE COMPARISON:  CT dated 05/14/2019. FINDINGS: Right Kidney: Renal measurements: 10.6 x 3.8 x 4 cm = volume: 85.1 mL . Echogenicity within normal limits. No mass or hydronephrosis visualized. Left Kidney: Renal measurements: 7.8 x 3 x 3.9 cm = volume: 48 mL. Echogenicity within normal limits. No mass or hydronephrosis visualized. Bladder: Appears normal for degree of bladder distention. The ureteral jets were not visualized. Other: There is a right-sided pleural effusion. IMPRESSION: 1. No acute sonographic abnormality.  No hydronephrosis. 2. Incidentally noted right-sided pleural effusion. Electronically Signed   By: Constance Holster M.D.   On: 07/11/2019 22:45    Pending Labs Unresulted Labs (From admission, onward)    Start     Ordered   07/12/19 0500  Comprehensive metabolic panel  Daily,   R     07/11/19 2202   07/12/19 0500  Magnesium  Daily,   R     07/11/19  2202    07/12/19 0500  Calcium  Daily,   R     07/11/19 2202   07/12/19 0500  Phosphorus  Daily,   R     07/11/19 2202   07/12/19 0500  CBC  Daily,   R     07/11/19 2202          Vitals/Pain Today's Vitals   07/12/19 0130 07/12/19 0200 07/12/19 0326 07/12/19 0434  BP: (!) 82/65 (!) 90/58    Pulse:      Resp: 16 (!) 21    Temp:      TempSrc:      SpO2:      PainSc:   Asleep Asleep    Isolation Precautions No active isolations  Medications Medications  pantoprazole (PROTONIX) injection 40 mg (40 mg Intravenous Given 07/11/19 2333)  latanoprost (XALATAN) 0.005 % ophthalmic solution 1 drop (has no administration in time range)  timolol (TIMOPTIC) 0.5 % ophthalmic solution 1 drop (has no administration in time range)  heparin injection 5,000 Units (5,000 Units Subcutaneous Given 07/11/19 2332)  sodium chloride flush (NS) 0.9 % injection 3 mL (3 mLs Intravenous Not Given 07/12/19 0359)  sodium chloride flush (NS) 0.9 % injection 3 mL (has no administration in time range)  0.9 %  sodium chloride infusion (has no administration in time range)  furosemide (LASIX) injection 80 mg (80 mg Intravenous Given 07/11/19 2333)    Mobility walks with device     Focused Assessments Neuro Assessment Handoff:  Swallow screen pass? No  Cardiac Rhythm: A-V Sequential paced, Atrial fibrillation NIH Stroke Scale ( + Modified Stroke Scale Criteria)  Level of Consciousness (1a.)   : Alert, keenly responsive LOC Questions (1b. )   +: Answers both questions correctly LOC Commands (1c. )   + : Performs both tasks correctly Best Gaze (2. )  +: Normal Visual (3. )  +: No visual loss Facial Palsy (4. )    : Normal symmetrical movements Motor Arm, Left (5a. )   +: No drift Motor Arm, Right (5b. )   +: No drift Motor Leg, Left (6a. )   +: Drift Motor Leg, Right (6b. )   +: Drift Limb Ataxia (7. ): Absent Sensory (8. )   +: Normal, no sensory loss Best Language (9. )   +: No aphasia Dysarthria (10.  ): Normal Extinction/Inattention (11.)   +: No Abnormality Modified SS Total  +: 2 Complete NIHSS TOTAL: 2 Last date known well: 07/10/19 Last time known well: 1500 Neuro Assessment: Exceptions to WDL Neuro Checks:      Last Documented NIHSS Modified Score: 2 (07/12/19 0101) Has TPA been given? No If patient is a Neuro Trauma and patient is going to OR before floor call report to [facility] nurse: [phone] or [phone]     R Recommendations: See Admitting Provider Note  Report given to:   Additional Notes:

## 2019-07-12 NOTE — Progress Notes (Addendum)
HPI on admission: Kim Mullins is a 85 y.o. female with medical history significant of  Congestive heart failure with systolic dysfunction ejection fraction 30 to 35%, status post pacemaker secondary to complete AV block, atrial fibrillation, CVA, hypertension, hospice patient at home because of severe congestive heart failure came with a chief complaint of change in mental status. Per [facility], pt from home where she is a hospice pt due to CHF, LSN yesterday at 1500. Family reports that pt was not answering Questions appropriately today so they called 911. VSS. Alert and oriented to self. Not answering other questions appropriately. Slow responses ED Course: Patient very lethargic response to painful stimulus follow some commands In the emergency room she was found to have worsening of the kidney function test and severe congestive heart failure with change in mental status.  Her BUN was 102 creatinine 2.0. CT scan no acute intracranial hemorrhage age-related atrophy No sign of sepsis chest x-ray and urine negative   A/P:  Reviewed chart. Cont current plan. Pt was under hospice service prior to this admission.   - CT scan negative No source of sepsis Ammonia level 95; may contribute; may start lactulose if pt is able to take PO  Can be secondary to hypoxemia Uremia   Patient cannot have an MRI because of the pacemaker Plan supplemental oxygen keep saturation more than 92 improve kidney function test   - May consult neurology consult  Acute on chronic congestive heart failure - BNP 1700 Patient on Bumex and Zaroxolyn Was given 1 dose of Lasix potassium dropped to 2.2  We will hold the Lasix until correct the potassium May consult Cardiology   Acute on chronic kidney disease stage IV Not sure that baseline Avoid nephrotoxins follow-up BUN and creatinine nephrology consult Kidney ultrasound was done negative for obstruction - Nephrology recs: She almost certainly won't tolerate  dialysis and also if started on dialysis will likely become dialysis dependent. She has very poor muscle mass and at age 36 she has CKD4 with a creatinine of 1.6-1.8. Her blood pressure is on the low side + poor functional status + age + comorbidities including advanced heart failure. I discussed this with the granddaughter who was bedside and advised medical management. Leg stockings to help mobilize fluids, strict I&O's, daily weights. She was 66.2kg when she left the hospital on 06/17/19. - follow Nephro recs   Hypokalemia S/P potassium replacement  - Mg WNL   Chronic atrial fibrillation - Patient was on Eliquis Will hold for renal failure and patient cannot swallow -  Heparin drips as a bridge Will await for swallowing test Patient has history of A. fib and CVA  History of pacemaker for complete AV block Looks like pacemaker is working okay - Will follow Geographical information systems officer   DVT prophylaxis: Heparin subcu  Code Status: Full code Family Communication: Discussed with the family member the CODE STATUS Disposition Plan: Discharge to nursing home Consults called: No Admission status: Full admission   Called both contact numbers in the chart. Nobody answered. Left VM with nurse station call back number.    Update: Nurse called later and said grand daughter and Pt had a discussion and decided to change to DNR/DNI. [name] and I had a phone conversation. She knows and she makes decision for Pt. Nurse then asked the Pt in front of grand daughter (while I was on the phone with nurse). Pt said she understood and wanted to be DNR/DNI.

## 2019-07-12 NOTE — ED Notes (Signed)
Report given to Jeani Sow, RN

## 2019-07-12 NOTE — H&P (Addendum)
History and Physical    Kim Mullins KPV:[id number] DOB: [birth year] DOA: 07/11/2019  PCP: Lin Landsman, MD    Patient coming from: Home    Chief Complaint:  Change in mental status, less responsive Denies any fever chills  HPI: Kim Mullins is a 85 y.o. female with medical history significant of  Congestive heart failure with systolic dysfunction ejection fraction 30 to 35%, status post pacemaker secondary to complete AV block, atrial fibrillation, CVA, hypertension, hospice patient at home because of severe congestive heart failure came with a chief complaint of change in mental status. Per [facility], pt from home where she is a hospice pt due to CHF, LSN yesterday at 1500. Family reports that pt was not answering  Questions appropriately today so they called 911. VSS. Alert and oriented to self. Not answering other questions appropriately. Slow responses ED Course:   Patient very lethargic response to painful stimulus follow some commands In the emergency room she was found to have worsening of the kidney function test and severe congestive heart failure with change in mental status.  Her BUN was 102 creatinine 2.0. CT scan no acute intracranial hemorrhage age-related atrophy No sign of sepsis chest x-ray and urine negative  Review of Systems:   Unable to evaluate because of the patient mental status  Past Medical History:  Diagnosis Date  . Anemia    occassionally  . Arthritis    all over.  . Asthma    Allergixc reaction to cats.  . Atrial fibrillation ([place])   . Bowel obstruction ([place])   . Chronic systolic CHF (congestive heart failure) (HCC)    EF 30-35% by cath, echo 2016 EF 50%  . Complete heart block (HCC)    a. s/p MDT CRTP pacemaker  . DCM (dilated cardiomyopathy) ([place]) 08/16/2014   normal coronary arteries on cath with EF 30-45%.  EF now 50% by echo 11/2014  . Diabetes mellitus 2006   Diet and exercise controlled.  Marland Kitchen Dyspnea   . GERD  (gastroesophageal reflux disease)    occ  . Glaucoma 08/17/2018  . Hypertension   . Left tibial fracture 2007  . OSA (obstructive sleep apnea) 10/23/2014   Moderate with AHI 21/hr  . Osteoarthritis of right shoulder region 06/26/2013  . Stroke [facility])     Past Surgical History:  Procedure Laterality Date  . ABDOMINAL HYSTERECTOMY  1969  . BI-VENTRICULAR PACEMAKER INSERTION N/A 10/07/2014   MDT CRTP implanted by Dr Lovena Le  . BRAVO [place] STUDY N/A 06/11/2014   Procedure: BRAVO [place] STUDY;  Surgeon: Inda Castle, MD;  Location: [facility] ENDOSCOPY;  Service: Endoscopy;  Laterality: N/A;  . BUNIONECTOMY  1984   Bilateral  . CARDIAC CATHETERIZATION     normal coronary arteries  . Carpal [place]  2004   Bilateral  . CATARACT EXTRACTION Right    early 2015  . corn removal  1999   Bilateral feet  . DILATION AND CURETTAGE OF UTERUS  1968  . ESOPHAGOGASTRODUODENOSCOPY (EGD) WITH PROPOFOL N/A 06/11/2014   Procedure: ESOPHAGOGASTRODUODENOSCOPY (EGD) WITH PROPOFOL;  Surgeon: Inda Castle, MD;  Location: [facility] ENDOSCOPY;  Service: Endoscopy;  Laterality: N/A;  . [place]   Surgery to fix Retainal detachment, bilateral  . JOINT REPLACEMENT    . LEFT AND RIGHT HEART CATHETERIZATION WITH CORONARY ANGIOGRAM N/A 08/29/2014   Procedure: LEFT AND RIGHT HEART CATHETERIZATION WITH CORONARY ANGIOGRAM;  Surgeon: Peter M Martinique, MD;  Location: [facility] CATH LAB;  Service: Cardiovascular;  Laterality:  N/A;  Venia Minks DILATION  06/11/2014   Procedure: Venia Minks DILATION;  Surgeon: Inda Castle, MD;  Location: [facility] ENDOSCOPY;  Service: Endoscopy;;  . PILONIDAL CYST EXCISION  1959  . RIGHT HEART CATH N/A 07/24/2018   Procedure: RIGHT HEART CATH;  Surgeon: Larey Dresser, MD;  Location: [facility] CV LAB;  Service: Cardiovascular;  Laterality: N/A;  . TONSILLECTOMY  1942  . TOTAL HIP ARTHROPLASTY  11/16/2011   Procedure: TOTAL HIP ARTHROPLASTY ANTERIOR APPROACH;  Surgeon: Hessie Dibble, MD;  Location: [place];   Service: Orthopedics;  Laterality: Right;  DEPUY  . TOTAL SHOULDER ARTHROPLASTY Right 06/26/2013   Procedure: TOTAL SHOULDER ARTHROPLASTY;  Surgeon: Johnny Bridge, MD;  Location: [place];  Service: Orthopedics;  Laterality: Right;     reports that she quit smoking about 40 years ago. Her smoking use included cigarettes. She has a 45.00 pack-year smoking history. She has never used smokeless tobacco. She reports current alcohol use of about 7.0 standard drinks of alcohol per week. She reports that she does not use drugs.  Allergies  Allergen Reactions  . Hydrocodone Other (See Comments)    Took away all energy and made her stop eating food for short time  . Percocet [Oxycodone-Acetaminophen] Other (See Comments)    Too away all energy and made her stop eating food for short time  . Eggs Or Egg-Derived Products Hives and Rash  . Mercurial Derivatives Itching and Rash  . Penicillins Hives and Rash    Has patient had a PCN reaction causing immediate rash, facial/tongue/throat swelling, SOB or lightheadedness with hypotension: Yes Has patient had a PCN reaction causing severe rash involving mucus membranes or skin necrosis: No Has patient had a PCN reaction that required hospitalization: No Has patient had a PCN reaction occurring within the last 10 years: Yes If all of the above answers are "NO", then may proceed with Cephalosporin use.  . Quinine Derivatives Hives and Rash    Family History  Problem Relation Age of Onset  . Dementia Mother   . Coronary artery disease Mother   . Cancer Father        blood ? type  . Diabetes Maternal Grandfather   . Anuerysm Daughter        brain  . Colon cancer Neg Hx      Prior to Admission medications   Medication Sig Start Date End Date Taking? Authorizing Provider  Acetaminophen (TYLENOL) 325 MG CAPS Take 650 mg by mouth daily as needed (for shoulder pain).     [provider]  apixaban (ELIQUIS) 2.5 MG TABS tablet Take 1 tablet (2.5  mg total) by mouth 2 (two) times daily. 06/08/19   Gerlene Fee, NP  bumetanide (BUMEX) 2 MG tablet Take 2 tablets (4 mg total) by mouth 2 (two) times daily. 06/08/19   Gerlene Fee, NP  diclofenac sodium (VOLTAREN) 1 % GEL Apply 2 g topically 3 (three) times daily as needed (pain, inflammation). Patient not taking: Reported on 06/25/2019 06/08/19   Gerlene Fee, NP  fexofenadine (ALLEGRA) 180 MG tablet Take 1 tablet (180 mg total) by mouth daily. 06/08/19   Gerlene Fee, NP  glucose blood test strip Use to test blood sugar twice daily ([employer] Smartview) Patient not taking: Reported on 06/25/2019 07/19/16   Larey Dresser, MD  hydrocortisone cream 1 % Apply topically daily as needed for itching. Cream; amt: sparingly; topical Special Instructions: apply to itchy skin Once A Day -  PRN PRN 1 Patient not taking: Reported on 06/25/2019 06/08/19   Gerlene Fee, NP  latanoprost (XALATAN) 0.005 % ophthalmic solution Place 1 drop into both eyes at bedtime. 06/08/19   Gerlene Fee, NP  metolazone (ZAROXOLYN) 2.5 MG tablet Take 2.5 mg by mouth once a week.    [provider]  OXYGEN Inhale 2 L into the lungs continuous.     [provider]  pantoprazole (PROTONIX) 40 MG tablet Take 1 tablet (40 mg total) by mouth daily. 06/08/19   Gerlene Fee, NP  polyethylene glycol (MIRALAX / GLYCOLAX) 17 g packet Take 17 g by mouth daily. Patient not taking: Reported on 06/18/2019 05/26/19   Shelly Coss, MD  senna (SENOKOT) 8.6 MG TABS tablet Take 1 tablet (8.6 mg total) by mouth at bedtime. 05/25/19   Shelly Coss, MD  timolol (TIMOPTIC) 0.5 % ophthalmic solution Place 1 drop into both eyes at bedtime. 06/08/19   Gerlene Fee, NP    Physical Exam: Vitals:   07/12/19 0030 07/12/19 0100 07/12/19 0130 07/12/19 0200  BP: 99/69 (!) 82/68 (!) 82/65 (!) 90/58  Pulse:  63    Resp: (!) 21 14 16  (!) 21  Temp:      TempSrc:      SpO2:  99%      Constitutional: NAD,  calm, comfortable Vitals:   07/12/19 0030 07/12/19 0100 07/12/19 0130 07/12/19 0200  BP: 99/69 (!) 82/68 (!) 82/65 (!) 90/58  Pulse:  63    Resp: (!) 21 14 16  (!) 21  Temp:      TempSrc:      SpO2:  99%     Eyes: PERRL, lids and conjunctivae normal ENMT: Mucous membranes are moist. Posterior pharynx clear of any exudate or lesions.Normal dentition.  Neck: normal, supple, no masses, no thyromegaly Respiratory: clear to auscultation bilaterally, no wheezing, no crackles. Normal respiratory effort. No accessory muscle use.  Cardiovascular: Regular rate and rhythm, no murmurs / rubs / gallops. No extremity edema. 2+ pedal pulses. No carotid bruits.  Abdomen: no tenderness, no masses palpated. No hepatosplenomegaly. Bowel sounds positive.  Musculoskeletal: no clubbing / cyanosis. No joint deformity upper and lower extremities. Good ROM, no contractures. Normal muscle tone.  Skin: no rashes, lesions, ulcers. No induration Neurologic: Unable to evaluate because of mental status patient very lethargic none no signs of focal neurologic deficit Psychiatric: Unable to evaluate because of the mental status.    Labs on Admission: I have personally reviewed following labs and imaging studies  CBC: Recent Labs  Lab 07/11/19 1437 07/11/19 1513 07/12/19 0332  WBC 4.2  --  3.7*  NEUTROABS 3.4  --   --   HGB 13.9 16.3* 13.2  HCT 41.2 48.0* 40.6  MCV 87.3  --  89.4  PLT 187  --  712   Basic Metabolic Panel: Recent Labs  Lab 07/11/19 1437 07/11/19 1513 07/11/19 2322 07/12/19 0332  NA 129* 131*  --  135  K 3.5 3.3*  --  2.2*  CL 86* 86*  --  91*  CO2 21*  --   --  24  GLUCOSE 128* 127*  --  82  BUN 74* 102*  --  74*  CREATININE 2.30* 2.00*  --  2.16*  CALCIUM 8.7*  --   --  9.0  MG  --   --   --  2.4  PHOS  --   --  4.6 4.6   GFR: Estimated Creatinine Clearance: 15.5  mL/min (A) (by C-G formula based on SCr of 2.16 mg/dL (H)). Liver Function Tests: Recent Labs  Lab 07/11/19 1437  07/12/19 0332  AST 88* 39  ALT 16 16  ALKPHOS 92 77  BILITOT 10.3* 8.5*  PROT 7.5 6.9  ALBUMIN 3.5 3.1*   No results for input(s): LIPASE, AMYLASE in the last 168 hours. No results for input(s): AMMONIA in the last 168 hours. Coagulation Profile: No results for input(s): INR, PROTIME in the last 168 hours. Cardiac Enzymes: Recent Labs  Lab 07/11/19 2322  CKTOTAL 68   BNP (last 3 results) No results for input(s): PROBNP in the last 8760 hours. HbA1C: No results for input(s): HGBA1C in the last 72 hours. CBG: Recent Labs  Lab 07/11/19 2142  GLUCAP 98   Lipid Profile: No results for input(s): CHOL, HDL, LDLCALC, TRIG, CHOLHDL, LDLDIRECT in the last 72 hours. Thyroid Function Tests: Recent Labs    07/11/19 2322  TSH 3.019   Anemia Panel: No results for input(s): VITAMINB12, FOLATE, FERRITIN, TIBC, IRON, RETICCTPCT in the last 72 hours. Urine analysis:    Component Value Date/Time   COLORURINE YELLOW 07/11/2019 1730   APPEARANCEUR CLEAR 07/11/2019 1730   LABSPEC 1.006 07/11/2019 1730   PHURINE 7.0 07/11/2019 1730   GLUCOSEU NEGATIVE 07/11/2019 1730   GLUCOSEU NEGATIVE 07/10/2014 1517   HGBUR NEGATIVE 07/11/2019 1730   BILIRUBINUR NEGATIVE 07/11/2019 1730   KETONESUR NEGATIVE 07/11/2019 1730   PROTEINUR NEGATIVE 07/11/2019 1730   UROBILINOGEN 0.2 10/06/2014 1552   NITRITE NEGATIVE 07/11/2019 1730   LEUKOCYTESUR NEGATIVE 07/11/2019 1730    Radiological Exams on Admission: Ct Head Wo Contrast  Result Date: 07/11/2019 CLINICAL DATA:  85 year old female with focal neurologic deficit greater than 6 hours. Possible stroke. EXAM: CT HEAD WITHOUT CONTRAST TECHNIQUE: Contiguous axial images were obtained from the base of the skull through the vertex without intravenous contrast. COMPARISON:  Head CT dated 05/14/2019. FINDINGS: Brain: Mild-to-moderate age-related atrophy and chronic microvascular ischemic changes. There is no acute intracranial hemorrhage. No mass effect  or midline shift. No extra-axial fluid collection. Vascular: No hyperdense vessel or unexpected calcification. Skull: Normal. Negative for fracture or focal lesion. Sinuses/Orbits: No acute finding. Other: None IMPRESSION: 1. No acute intracranial hemorrhage. 2. Age-related atrophy and chronic microvascular ischemic changes. Electronically Signed   By: Anner Crete M.D.   On: 07/11/2019 16:27   US Renal  Result Date: 07/11/2019 CLINICAL DATA:  Acute kidney injury EXAM: RENAL / URINARY TRACT ULTRASOUND COMPLETE COMPARISON:  CT dated 05/14/2019. FINDINGS: Right Kidney: Renal measurements: 10.6 x 3.8 x 4 cm = volume: 85.1 mL . Echogenicity within normal limits. No mass or hydronephrosis visualized. Left Kidney: Renal measurements: 7.8 x 3 x 3.9 cm = volume: 48 mL. Echogenicity within normal limits. No mass or hydronephrosis visualized. Bladder: Appears normal for degree of bladder distention. The ureteral jets were not visualized. Other: There is a right-sided pleural effusion. IMPRESSION: 1. No acute sonographic abnormality.  No hydronephrosis. 2. Incidentally noted right-sided pleural effusion. Electronically Signed   By: Constance Holster M.D.   On: 07/11/2019 22:45    EKG: Independently reviewed.   Assessment/Plan Active Problems:   Esophageal stricture   Pulmonary HTN (HCC)   DCM (dilated cardiomyopathy) (HCC)   Benign essential HTN   Acute on chronic systolic CHF (congestive heart failure) (HCC)   OSA (obstructive sleep apnea)   Pacemaker   History of stroke   Atrial fibrillation (HCC)   CKD (chronic kidney disease), stage V ([place])  Acute kidney injury superimposed on chronic kidney disease (HCC)   Acute hepatic encephalopathy   Acute encephalopathy   Acute encephalopathy Not sure of the origin CT scan negative No source of sepsis Ammonia level pending Can be secondary to hypoxemia And deteriorating of kidney function increased BUN and creatinine Rule out CVA Patient cannot  have an MRI because of the pacemaker Plan supplemental oxygen keep saturation more than 92 improve kidney function test, neurology consult   Acute on chronic congestive heart failure BNP 1700 Patient on Bumex and Zaroxolyn Was given 1 dose of Lasix potassium dropped to 2.2 We will hold the Lasix until correct the potassium Plan cardiology consult  Acute on chronic kidney disease stage IV Not sure that baseline Avoid nephrotoxins follow-up BUN and creatinine nephrology consult Kidney ultrasound was done negative for obstruction  Hypokalemia Add potassium check magnesium  Chronic atrial fibrillation Patient on Eliquis Will hold for renal failure and patient cannot swallow We will hold for now Heparin drips as a bridge Will await for swallowing test Patient has history of A. fib and CVA We will not start the heparin drip until clarifies the CODE STATUS  History of pacemaker for complete AV block Looks like pacemaker is working okay Follow-up with cardiology  DVT prophylaxis: Heparin subcu Code Status: Full code Family Communication: Discussed with the family member the CODE STATUS Disposition Plan: Discharge to nursing home Consults called: No Admission status: Full admission   Linard Millers Quinlynn Cuthbert MD [facility]  If 7PM-7AM, please contact night-coverage [email or web address]   07/12/2019, 5:11 AM

## 2019-07-12 NOTE — Progress Notes (Signed)
Paged MD regarding Pt stating she wished to be a DNR, per Pt's granddaughter. This RN had conversation with Pt about being a DNR and what that would mean. Pt understood if her heart were to stop we would not perform CPR, intubate or give any medications. This RN asked if this is what the Pt wanted if such an event were to happen and she stated yes. MD was on phone while this conversation took place. Granddaughter in room at bedside. MD to place DNR order for chart.

## 2019-07-12 NOTE — Progress Notes (Signed)
Admit: 07/11/2019 LOS: 1  74F CKD3 BL 1.2 to 1.8, HFREF, admit with AoCKD3  wth progressive weakness and edema, AMS  Subjective:  . Low K this AM, repleted  . Stable  SC4, BUN 74 . Is currently rec hospice . PT lethargic . 3+ LEE . NH3 elevated . BPs are soft, not unusal for her . 2L [place] . Renal US w/o acute structural issues  11/11 0701 - 11/12 0700 In: -  Out: 925 [Urine:925]  There were no vitals filed for this visit.  Scheduled Meds: . heparin  5,000 Units Subcutaneous Q8H  . lactulose  10 g Oral TID  . latanoprost  1 drop Both Eyes QHS  . pantoprazole (PROTONIX) IV  40 mg Intravenous Q24H  . sodium chloride flush  3 mL Intravenous Q12H  . timolol  1 drop Both Eyes QHS   Continuous Infusions: . sodium chloride     PRN Meds:.sodium chloride, sodium chloride flush  Current Labs: reviewed    Physical Exam:  Blood pressure 100/83, pulse 62, temperature 98.5 F (36.9 C), temperature source Oral, resp. rate 20, SpO2 98 %. Elderly female, chronically ill appaering Regular Diminished throughout esp in bases 2-3+ LEE S/nd   A 1. AMS, ? Hepatic encephalopathy 2. AoCKD4 BL SCr 1.5 to 2.0 3. CHF, on hospice 4. HTN 5. AFib  P . Hold diuretics for 24h . Not a candidate for HD . Daily weights, Daily Renal Panel, Strict I/Os, Avoid nephrotoxins (NSAIDs, judicious IV Contrast)    Pearson Grippe MD 07/12/2019, 5:31 PM  Recent Labs  Lab 07/11/19 1437 07/11/19 1513 07/11/19 2322 07/12/19 0332  NA 129* 131*  --  135  K 3.5 3.3*  --  2.2*  CL 86* 86*  --  91*  CO2 21*  --   --  24  GLUCOSE 128* 127*  --  82  BUN 74* 102*  --  74*  CREATININE 2.30* 2.00*  --  2.16*  CALCIUM 8.7*  --   --  9.0  PHOS  --   --  4.6 4.6   Recent Labs  Lab 07/11/19 1437 07/11/19 1513 07/12/19 0332  WBC 4.2  --  3.7*  NEUTROABS 3.4  --   --   HGB 13.9 16.3* 13.2  HCT 41.2 48.0* 40.6  MCV 87.3  --  89.4  PLT 187  --  158

## 2019-07-12 NOTE — Evaluation (Signed)
Clinical/Bedside Swallow Evaluation Patient Details  Name: Kim Mullins MRN: [id number] Date of Birth: [birth year]  Today's Date: 07/12/2019 Time: SLP Start Time (ACUTE ONLY): 1149 SLP Stop Time (ACUTE ONLY): 1204 SLP Time Calculation (min) (ACUTE ONLY): 15 min  Past Medical History:  Past Medical History:  Diagnosis Date  . Anemia    occassionally  . Arthritis    all over.  . Asthma    Allergixc reaction to cats.  . Atrial fibrillation ([place])   . Bowel obstruction ([place])   . Chronic systolic CHF (congestive heart failure) (HCC)    EF 30-35% by cath, echo 2016 EF 50%  . Complete heart block (HCC)    a. s/p MDT CRTP pacemaker  . DCM (dilated cardiomyopathy) ([place]) 08/16/2014   normal coronary arteries on cath with EF 30-45%.  EF now 50% by echo 11/2014  . Diabetes mellitus 2006   Diet and exercise controlled.  Marland Kitchen Dyspnea   . GERD (gastroesophageal reflux disease)    occ  . Glaucoma 08/17/2018  . Hypertension   . Left tibial fracture 2007  . OSA (obstructive sleep apnea) 10/23/2014   Moderate with AHI 21/hr  . Osteoarthritis of right shoulder region 06/26/2013  . Stroke [facility])    Past Surgical History:  Past Surgical History:  Procedure Laterality Date  . ABDOMINAL HYSTERECTOMY  1969  . BI-VENTRICULAR PACEMAKER INSERTION N/A 10/07/2014   MDT CRTP implanted by Dr Lovena Le  . BRAVO [place] STUDY N/A 06/11/2014   Procedure: BRAVO [place] STUDY;  Surgeon: Inda Castle, MD;  Location: [facility] ENDOSCOPY;  Service: Endoscopy;  Laterality: N/A;  . BUNIONECTOMY  1984   Bilateral  . CARDIAC CATHETERIZATION     normal coronary arteries  . Carpal [place]  2004   Bilateral  . CATARACT EXTRACTION Right    early 2015  . corn removal  1999   Bilateral feet  . DILATION AND CURETTAGE OF UTERUS  1968  . ESOPHAGOGASTRODUODENOSCOPY (EGD) WITH PROPOFOL N/A 06/11/2014   Procedure: ESOPHAGOGASTRODUODENOSCOPY (EGD) WITH PROPOFOL;  Surgeon: Inda Castle, MD;  Location: [facility] ENDOSCOPY;  Service:  Endoscopy;  Laterality: N/A;  . [place]   Surgery to fix Retainal detachment, bilateral  . JOINT REPLACEMENT    . LEFT AND RIGHT HEART CATHETERIZATION WITH CORONARY ANGIOGRAM N/A 08/29/2014   Procedure: LEFT AND RIGHT HEART CATHETERIZATION WITH CORONARY ANGIOGRAM;  Surgeon: Peter M Martinique, MD;  Location: [facility] CATH LAB;  Service: Cardiovascular;  Laterality: N/A;  . MALONEY DILATION  06/11/2014   Procedure: Venia Minks DILATION;  Surgeon: Inda Castle, MD;  Location: [facility] ENDOSCOPY;  Service: Endoscopy;;  . PILONIDAL CYST EXCISION  1959  . RIGHT HEART CATH N/A 07/24/2018   Procedure: RIGHT HEART CATH;  Surgeon: Larey Dresser, MD;  Location: [place] CV LAB;  Service: Cardiovascular;  Laterality: N/A;  . TONSILLECTOMY  1942  . TOTAL HIP ARTHROPLASTY  11/16/2011   Procedure: TOTAL HIP ARTHROPLASTY ANTERIOR APPROACH;  Surgeon: Hessie Dibble, MD;  Location: [place];  Service: Orthopedics;  Laterality: Right;  DEPUY  . TOTAL SHOULDER ARTHROPLASTY Right 06/26/2013   Procedure: TOTAL SHOULDER ARTHROPLASTY;  Surgeon: Johnny Bridge, MD;  Location: [place];  Service: Orthopedics;  Laterality: Right;   HPI:  Kim Mullins is a 85 y.o. female with medical history significant of Congestive heart failure with systolic dysfunction ejection fraction 30 to 35%, status post pacemaker secondary to complete AV block, atrial fibrillation, CVA, GERD, hypertension, hospice patient at home because of  severe congestive heart failure came with a chief complaint of change in mental status. CT 11/11 revealed no acute findings.  No chest imaging at present.   Assessment / Plan / Recommendation Clinical Impression  Pt presents with functional swallowing as assessed clinically; however, there was frequent belching noted, occasionally followed by throat clear.  Pt denies that this is typical, but does have a hx of GERD.  Throat clear only occurred after belching and most frequently with liquids. Suspect  possible esophageal issues rather than oropharyngeal dysphagia. There was additional prolonged coughing well after PO trials had ceased in which pt stated that it felt like water had gone the wrong way.  It is unclear if this is related to a possible reflux event, or secretion management.  Consider GI consult if symptoms persist.  Pt tolerated serial sips of thin liquid by straw with no overt s/s of aspiration.  Pt exhibited good oral clearance of regular solids following prolonged oral phase.  There were no s/s of aspiration with puree or regular solids.  Pt states that she consumes regular texture diet at home.    Recommend regular texture diet with thin liquid.  SLP will follow up for diet tolerance.  SLP Visit Diagnosis: Dysphagia, unspecified (R13.10)    Aspiration Risk  No limitations;Mild aspiration risk    Diet Recommendation Regular;Thin liquid   Liquid Administration via: Cup;Straw Medication Administration: Whole meds with liquid Supervision: Patient able to self feed(assist as needed) Compensations: Minimize environmental distractions;Slow rate;Small sips/bites Postural Changes: Seated upright at 90 degrees;Remain upright for at least 30 minutes after po intake    Other  Recommendations Recommended Consults: Consider GI evaluation Oral Care Recommendations: Oral care BID   Follow up Recommendations None      Frequency and Duration min 2x/week  2 weeks       Prognosis   Good     Swallow Study   General HPI: Kim Mullins is a 85 y.o. female with medical history significant of Congestive heart failure with systolic dysfunction ejection fraction 30 to 35%, status post pacemaker secondary to complete AV block, atrial fibrillation, CVA, GERD, hypertension, hospice patient at home because of severe congestive heart failure came with a chief complaint of change in mental status. CT 11/11 revealed no acute findings.  No chest imaging at present. Type of Study: Bedside  Swallow Evaluation Previous Swallow Assessment: None Diet Prior to this Study: NPO Temperature Spikes Noted: No Respiratory Status: Nasal cannula History of Recent Intubation: No Behavior/Cognition: Alert;Cooperative;Pleasant mood Oral Cavity Assessment: Within Functional Limits Oral Cavity - Dentition: Missing dentition(Top) Vision: Functional for self-feeding Self-Feeding Abilities: Able to feed self Patient Positioning: Upright in bed Baseline Vocal Quality: Normal Volitional Cough: Strong Volitional Swallow: Able to elicit    Oral/Motor/Sensory Function Overall Oral Motor/Sensory Function: Mild impairment Facial ROM: Reduced left;Reduced right Facial Symmetry: Within Functional Limits Lingual ROM: Within Functional Limits Lingual Symmetry: Within Functional Limits Lingual Strength: Within Functional Limits Velum: Within Functional Limits Mandible: Within Functional Limits   Ice Chips Ice chips: Not tested   Thin Liquid Thin Liquid: Impaired Presentation: Cup;Straw Other Comments: Frequent belching, with throat clear following    Nectar Thick Nectar Thick Liquid: Not tested   Honey Thick Honey Thick Liquid: Not tested   Puree Puree: Within functional limits   Solid     Solid: Impaired Oral Phase Functional Implications: Prolonged oral transit      Celedonio Savage, [place], [place] Office: [phone] 07/12/2019,12:17 PM

## 2019-07-12 NOTE — ED Notes (Signed)
SDU  Paged admitting Dr to [facility]

## 2019-07-12 NOTE — Progress Notes (Signed)
Pt admitted to 5W39 from ED. Pt A&O x4 with some forgetfulness. VS stable with BP on softer side, Pt in no distress. Full assessment charted. Tele monitor placed and verified. Skin intact. Call bell within reach. All questions/concerns addressed. Will continue to monitor.

## 2019-07-12 NOTE — Progress Notes (Signed)
[facility] Admission RN Note @ 1330  Kim Mullins is under hospice services with a terminal diagnosis of CHF per Dr. Tomasa Hosteller. On 11/11, pt granddaughter and NOK, noticed she was not responding appropriately.  Family contacted hospice, we evaluated in the home and family elected to send the pt to the hospital for further workup.  Pt is admitted with acute encephalopathy.  This is a related admission.  Spoke with Glendell Docker, granddaughter and NOK, updated and provided support. Per MD, pt needs dialysis but is not medically suitable to with stand this treatment. [name] agrees with plan to focus on comfort.  Plan will be to return back home once medically cleared.   V/S: 98 oral, 91/73, HR 62, RR 16, SPO2 99% RA I&O: 0/925 Abnormal lab work: K 2.2, Cl 91, BUN 74, Cr 2.16, ammonia 95, BNP 1753, total BR 8.5, gfr 23 Diagnostics: Portable Chest- vascular congestion and bibasilar atelectasis IVs/PRNs:  Lasix 80 mg IV x 1, potassium chloride 10 mEq IV x 4  Problem List: Acute encephalopathy- pt now alert and oriented to her baseline Acute on Chronic HF- BNP 1753, xray shows vascular congestion, correcting K before continuing diuresing Hypokalemia- K 2.2, repleting, 4 runs of 10 mEq IV  Interdisciplinary hospice team: updated Family: updated D/C planning: return home with hospice support GOC: ongoing, pt is a full code  Transfer summary and med list to unit for shadow chart.  Venia Carbon RN, BSN, [facility] Liaison (in [place]) [phone]

## 2019-07-12 NOTE — Consult Note (Signed)
Cardiology Consultation:   Patient ID: Raimi Guillermo; [id number]; 1930-07-25   Admit date: 07/11/2019 Date of Consult: 07/12/2019  Primary Care Provider: Lin Landsman, MD Primary Cardiologist: Dr. Aundra Dubin Primary Electrophysiologist:  None  Patient Profile:   Zarinah Oviatt is a 85 y.o. female with a hx of chronic systolic CHD/NICM, prior complete heart block, OSA, and paroxysmal atrial fibrillation(on Eliquis), on hospice for CHF who is being seen today for the evaluation of congestive heart failure at the request of Dr. Izetta Dakin.  History of Present Illness:   Ms. Laperle is an 85 year female with the history listed above who presented with worsening lethargy and altered mental status, per her family that patient started not answering questions appropriately and was having very slow responses.  This has been going on for a few days, she was noted to have an uptrending bilirubin on hospice.  At baseline patient is alert and oriented x4, able to carry on a conversation.   On arrival to the ED she was noted to be afebrile, tachypneic, heart rate in the 60s, hypotensive down to 82/65.  CT scan showed no acute intracranial hemorrhage, with age-related atrophy.  Neurology was consulted, felt that it was more consistent with encephalopathy.  Labs are significant for BUN of 102 and creatinine of 2.0.  Concern for uremic encephalopathy so nephrology was consulted.  In the ED patient was given 80 mg IV Lasix due to concern for fluid overload, potassium down to 2.2 which is being repleted.  Today she is alert and oriented, she reports chronic shortness of breath, is on 2 L of oxygen at home, chronic lower extremity swelling, and chronic orthopnea that is stable.  She denies any chest pain, palpitations, headaches, nausea, vomiting, or other symptoms.  She does endorse that she has had a decreased appetite.  She denies any new complaints at this time.  Patient was last seen in the heart care  clinic on 04/30/2019, weight at that time was 55.2 kg (121 lbs), she had a recent PYP scan that was highly suggestive of  TTR amyloidosis, had been having progressive worsening dyspnea and volume overload complicated by CKD stage IV.  She was continued on bumetanide and metolazone. Her pacemaker, Medtronic CRT-P, was last checked on 09/28/18.  At that time she was noted to have poor prognosis due to her advanced heart failure.  Patient was admitted from 10/12-10/18 for a acute on chronic heart failure exacerbation requiring IV Lasix, there was discussion with palliative care at that time and was discharged home with hospice.  Past Medical History:  Diagnosis Date  . Anemia    occassionally  . Arthritis    all over.  . Asthma    Allergixc reaction to cats.  . Atrial fibrillation ([place])   . Bowel obstruction ([place])   . Chronic systolic CHF (congestive heart failure) (HCC)    EF 30-35% by cath, echo 2016 EF 50%  . Complete heart block (HCC)    a. s/p MDT CRTP pacemaker  . DCM (dilated cardiomyopathy) ([place]) 08/16/2014   normal coronary arteries on cath with EF 30-45%.  EF now 50% by echo 11/2014  . Diabetes mellitus 2006   Diet and exercise controlled.  Marland Kitchen Dyspnea   . GERD (gastroesophageal reflux disease)    occ  . Glaucoma 08/17/2018  . Hypertension   . Left tibial fracture 2007  . OSA (obstructive sleep apnea) 10/23/2014   Moderate with AHI 21/hr  . Osteoarthritis of right shoulder region  06/26/2013  . Stroke [facility])     Past Surgical History:  Procedure Laterality Date  . ABDOMINAL HYSTERECTOMY  1969  . BI-VENTRICULAR PACEMAKER INSERTION N/A 10/07/2014   MDT CRTP implanted by Dr Lovena Le  . BRAVO [place] STUDY N/A 06/11/2014   Procedure: BRAVO [place] STUDY;  Surgeon: Inda Castle, MD;  Location: [facility] ENDOSCOPY;  Service: Endoscopy;  Laterality: N/A;  . BUNIONECTOMY  1984   Bilateral  . CARDIAC CATHETERIZATION     normal coronary arteries  . Carpal [place]  2004   Bilateral  . CATARACT  EXTRACTION Right    early 2015  . corn removal  1999   Bilateral feet  . DILATION AND CURETTAGE OF UTERUS  1968  . ESOPHAGOGASTRODUODENOSCOPY (EGD) WITH PROPOFOL N/A 06/11/2014   Procedure: ESOPHAGOGASTRODUODENOSCOPY (EGD) WITH PROPOFOL;  Surgeon: Inda Castle, MD;  Location: [facility] ENDOSCOPY;  Service: Endoscopy;  Laterality: N/A;  . [place]   Surgery to fix Retainal detachment, bilateral  . JOINT REPLACEMENT    . LEFT AND RIGHT HEART CATHETERIZATION WITH CORONARY ANGIOGRAM N/A 08/29/2014   Procedure: LEFT AND RIGHT HEART CATHETERIZATION WITH CORONARY ANGIOGRAM;  Surgeon: Peter M Martinique, MD;  Location: [facility] CATH LAB;  Service: Cardiovascular;  Laterality: N/A;  . MALONEY DILATION  06/11/2014   Procedure: Venia Minks DILATION;  Surgeon: Inda Castle, MD;  Location: [facility] ENDOSCOPY;  Service: Endoscopy;;  . PILONIDAL CYST EXCISION  1959  . RIGHT HEART CATH N/A 07/24/2018   Procedure: RIGHT HEART CATH;  Surgeon: Larey Dresser, MD;  Location: [place] CV LAB;  Service: Cardiovascular;  Laterality: N/A;  . TONSILLECTOMY  1942  . TOTAL HIP ARTHROPLASTY  11/16/2011   Procedure: TOTAL HIP ARTHROPLASTY ANTERIOR APPROACH;  Surgeon: Hessie Dibble, MD;  Location: [place];  Service: Orthopedics;  Laterality: Right;  DEPUY  . TOTAL SHOULDER ARTHROPLASTY Right 06/26/2013   Procedure: TOTAL SHOULDER ARTHROPLASTY;  Surgeon: Johnny Bridge, MD;  Location: [place];  Service: Orthopedics;  Laterality: Right;     Home Medications:  Prior to Admission medications   Medication Sig Start Date End Date Taking? Authorizing Provider  Acetaminophen (TYLENOL) 325 MG CAPS Take 650 mg by mouth daily as needed (for shoulder pain).    Yes [provider]  apixaban (ELIQUIS) 2.5 MG TABS tablet Take 1 tablet (2.5 mg total) by mouth 2 (two) times daily. 06/08/19  Yes Gerlene Fee, NP  bumetanide (BUMEX) 2 MG tablet Take 2 tablets (4 mg total) by mouth 2 (two) times daily. 06/08/19  Yes Gerlene Fee,  NP  diclofenac sodium (VOLTAREN) 1 % GEL Apply 2 g topically 3 (three) times daily as needed (pain, inflammation). 06/08/19  Yes Gerlene Fee, NP  fexofenadine (ALLEGRA) 180 MG tablet Take 1 tablet (180 mg total) by mouth daily. 06/08/19  Yes Gerlene Fee, NP  hydrocortisone cream 1 % Apply topically daily as needed for itching. Cream; amt: sparingly; topical Special Instructions: apply to itchy skin Once A Day - PRN PRN 1 06/08/19  Yes Gerlene Fee, NP  latanoprost (XALATAN) 0.005 % ophthalmic solution Place 1 drop into both eyes at bedtime. 06/08/19  Yes Gerlene Fee, NP  metolazone (ZAROXOLYN) 2.5 MG tablet Take 2.5 mg by mouth once a week.   Yes [provider]  OXYGEN Inhale 2 L into the lungs continuous.    Yes [provider]  pantoprazole (PROTONIX) 40 MG tablet Take 1 tablet (40 mg total) by mouth daily. 06/08/19  Yes Gerlene Fee, NP  polyethylene glycol (MIRALAX / GLYCOLAX) 17 g packet Take 17 g by mouth daily. Patient taking differently: Take 17 g by mouth daily as needed for mild constipation.  05/26/19  Yes Shelly Coss, MD  senna (SENOKOT) 8.6 MG TABS tablet Take 1 tablet (8.6 mg total) by mouth at bedtime. Patient taking differently: Take 1 tablet by mouth daily.  05/25/19  Yes Shelly Coss, MD  timolol (TIMOPTIC) 0.5 % ophthalmic solution Place 1 drop into both eyes at bedtime. 06/08/19  Yes Gerlene Fee, NP  glucose blood test strip Use to test blood sugar twice daily ([employer] Smartview) Patient not taking: Reported on 06/25/2019 07/19/16   Larey Dresser, MD    Inpatient Medications: Scheduled Meds: . heparin  5,000 Units Subcutaneous Q8H  . lactulose  10 g Oral TID  . latanoprost  1 drop Both Eyes QHS  . pantoprazole (PROTONIX) IV  40 mg Intravenous Q24H  . sodium chloride flush  3 mL Intravenous Q12H  . timolol  1 drop Both Eyes QHS   Continuous Infusions: . sodium chloride     PRN Meds: sodium chloride, sodium chloride  flush  Allergies:    Allergies  Allergen Reactions  . Hydrocodone Other (See Comments)    Took away all energy and made her stop eating food for short time  . Percocet [Oxycodone-Acetaminophen] Other (See Comments)    Too away all energy and made her stop eating food for short time  . Eggs Or Egg-Derived Products Hives and Rash  . Mercurial Derivatives Itching and Rash  . Penicillins Hives and Rash    Has patient had a PCN reaction causing immediate rash, facial/tongue/throat swelling, SOB or lightheadedness with hypotension: Yes Has patient had a PCN reaction causing severe rash involving mucus membranes or skin necrosis: No Has patient had a PCN reaction that required hospitalization: No Has patient had a PCN reaction occurring within the last 10 years: Yes If all of the above answers are "NO", then may proceed with Cephalosporin use.  . Quinine Derivatives Hives and Rash    Social History:   Social History   Socioeconomic History  . Marital status: Widowed    Spouse name: Not on file  . Number of children: 0  . Years of education: Not on file  . Highest education level: Doctorate  Occupational History  . Occupation: retired  Scientific laboratory technician  . Financial resource strain: Not hard at all  . Food insecurity    Worry: Never true    Inability: Never true  . Transportation needs    Medical: No    Non-medical: No  Tobacco Use  . Smoking status: Former Smoker    Packs/day: 1.00    Years: 45.00    Pack years: 45.00    Types: Cigarettes    Quit date: 07/24/1978    Years since quitting: 40.9  . Smokeless tobacco: Never Used  Substance and Sexual Activity  . Alcohol use: Yes    Alcohol/week: 7.0 standard drinks    Types: 7 Glasses of wine per week    Comment: every day  . Drug use: No  . Sexual activity: Not Currently  Lifestyle  . Physical activity    Days per week: Not on file    Minutes per session: Not on file  . Stress: Not on file  Relationships  . Social  Herbalist on phone: Not on file    Gets together: Not on file  Attends religious service: Not on file    Active member of club or organization: Not on file    Attends meetings of clubs or organizations: Not on file    Relationship status: Not on file  . Intimate partner violence    Fear of current or ex partner: Not on file    Emotionally abused: Not on file    Physically abused: Not on file    Forced sexual activity: Not on file  Other Topics Concern  . Not on file  Social History Narrative   Lives alone    Family History:    Family History  Problem Relation Age of Onset  . Dementia Mother   . Coronary artery disease Mother   . Cancer Father        blood ? type  . Diabetes Maternal Grandfather   . Anuerysm Daughter        brain  . Colon cancer Neg Hx      ROS:  Please see the history of present illness.  Review of Systems  Cardiovascular: Chest pain:    All other systems reviewed and are negative.   All other ROS reviewed and negative.     Physical Exam/Data:   Vitals:   07/12/19 0830 07/12/19 0900 07/12/19 1111 07/12/19 1200  BP: (!) 89/70 98/78 91/73  (P) 98/71  Pulse:   62 (P) 62  Resp: 16 20 16    Temp:    (P) 98.5 F (36.9 C)  TempSrc:   Oral (P) Oral  SpO2:   97% (P) 97%    Intake/Output Summary (Last 24 hours) at 07/12/2019 1441 Last data filed at 07/12/2019 1100 Gross per 24 hour  Intake 300 ml  Output 1225 ml  Net -925 ml   There were no vitals filed for this visit. There is no height or weight on file to calculate BMI.  General: Frail appearing female, no acute distress, sitting up in bed HEENT: normal Lymph: no adenopathy Neck: elevated JVD Endocrine:  No thryomegaly Vascular: No carotid bruits; FA pulses 2+ bilaterally without bruits  Cardiac:  normal S1, S2; RRR; no murmur  Lungs:  Bibasilar crackles, no wheezing, rhonchi or rales  Abd: soft, nontender, no hepatomegaly  Ext: 2+ BL LE edema Musculoskeletal:  No  deformities, BUE and BLE strength normal and equal Skin: warm and dry  Neuro:  CNs 2-12 intact, no focal abnormalities noted Psych:  Normal affect   EKG:  The EKG was personally reviewed and demonstrates:  AV paced, HR 60s Telemetry:  Telemetry was personally reviewed and demonstrates:  Paced, atrial fibrillation, multiple PVCs  Relevant CV Studies:  07/21/2018 Echocardiogram: Study Conclusions  - Left ventricle: The cavity size was normal. Wall thickness was   increased in a pattern of moderate LVH. Systolic function was   moderately reduced. The estimated ejection fraction was in the   range of 35% to 40%. There is akinesis of the   basal-midinferolateral myocardium. There was no evidence of   elevated ventricular filling pressure by Doppler parameters. - Mitral valve: There was mild regurgitation. - Left atrium: The atrium was moderately dilated. - Right ventricle: The cavity size was mildly dilated. Wall   thickness was normal. - Right atrium: The atrium was severely dilated. - Tricuspid valve: There was severe regurgitation. - Pulmonic valve: There was moderate regurgitation. - Pulmonary arteries: Systolic pressure was mildly increased. PA   peak pressure: 39 mm Hg (S).  07/24/2018 Cardiac catheterization: 1. Elevated filling pressures, R>L.  Severe  TR may make right-sided failure worse.  2. Good cardiac output on milrinone.  3. Pulmonary venous hypertension.   Laboratory Data:  Chemistry Recent Labs  Lab 07/11/19 1437 07/11/19 1513 07/12/19 0332  NA 129* 131* 135  K 3.5 3.3* 2.2*  CL 86* 86* 91*  CO2 21*  --  24  GLUCOSE 128* 127* 82  BUN 74* 102* 74*  CREATININE 2.30* 2.00* 2.16*  CALCIUM 8.7*  --  9.0  GFRNONAA 19*  --  20*  GFRAA 22*  --  23*  ANIONGAP 22*  --  20*    Recent Labs  Lab 07/11/19 1437 07/12/19 0332  PROT 7.5 6.9  ALBUMIN 3.5 3.1*  AST 88* 39  ALT 16 16  ALKPHOS 92 77  BILITOT 10.3* 8.5*   Hematology Recent Labs  Lab 07/11/19  1437 07/11/19 1513 07/12/19 0332  WBC 4.2  --  3.7*  RBC 4.72  --  4.54  HGB 13.9 16.3* 13.2  HCT 41.2 48.0* 40.6  MCV 87.3  --  89.4  MCH 29.4  --  29.1  MCHC 33.7  --  32.5  RDW 20.3*  --  20.2*  PLT 187  --  158   Cardiac EnzymesNo results for input(s): TROPONINI in the last 168 hours. No results for input(s): TROPIPOC in the last 168 hours.  BNP Recent Labs  Lab 07/11/19 2322  BNP 1,753.6*    DDimer No results for input(s): DDIMER in the last 168 hours.  Radiology/Studies:  Ct Head Wo Contrast  Result Date: 07/11/2019 CLINICAL DATA:  85 year old female with focal neurologic deficit greater than 6 hours. Possible stroke. EXAM: CT HEAD WITHOUT CONTRAST TECHNIQUE: Contiguous axial images were obtained from the base of the skull through the vertex without intravenous contrast. COMPARISON:  Head CT dated 05/14/2019. FINDINGS: Brain: Mild-to-moderate age-related atrophy and chronic microvascular ischemic changes. There is no acute intracranial hemorrhage. No mass effect or midline shift. No extra-axial fluid collection. Vascular: No hyperdense vessel or unexpected calcification. Skull: Normal. Negative for fracture or focal lesion. Sinuses/Orbits: No acute finding. Other: None IMPRESSION: 1. No acute intracranial hemorrhage. 2. Age-related atrophy and chronic microvascular ischemic changes. Electronically Signed   By: Anner Crete M.D.   On: 07/11/2019 16:27   US Renal  Result Date: 07/11/2019 CLINICAL DATA:  Acute kidney injury EXAM: RENAL / URINARY TRACT ULTRASOUND COMPLETE COMPARISON:  CT dated 05/14/2019. FINDINGS: Right Kidney: Renal measurements: 10.6 x 3.8 x 4 cm = volume: 85.1 mL . Echogenicity within normal limits. No mass or hydronephrosis visualized. Left Kidney: Renal measurements: 7.8 x 3 x 3.9 cm = volume: 48 mL. Echogenicity within normal limits. No mass or hydronephrosis visualized. Bladder: Appears normal for degree of bladder distention. The ureteral jets were not  visualized. Other: There is a right-sided pleural effusion. IMPRESSION: 1. No acute sonographic abnormality.  No hydronephrosis. 2. Incidentally noted right-sided pleural effusion. Electronically Signed   By: Constance Holster M.D.   On: 07/11/2019 22:45    Assessment and Plan:   #Chronic systolic heart failure: Secondary to nonischemic cardiomyopathy.  Echocardiogram in 06/2018 showed EF of 35 to 40%.  She has chronic dyspnea, on 2 L oxygen at home, lower extremity edema and orthopnea.  Denies any chest pain or palpitation. Weight on discharge 06/17/19 was 66 kg, weight today was not taken.  BNP is elevated to 1750.  No chest x-ray noted.  She was treated with 1 dose IV Lasix 80 mg once in the ED, potassium down to  2.2.  Patient is currently being evaluated outpatient by hospice for her advanced heart failure.  She is on Bumex and Zaroxolyn at home, denies any issues taking the medication.  Denies any dietary changes, does report some mild decreased intake.  She was also noted to have some hypotension down to 73U systolic. Overall it difficult situation given her significant volume overload and hypotension. -Hold home Bumex and Zaroxolyn  -Correct hypokalemia then restart low-dose Lasix 40 mg IV daily -Strict I+Os, daily weights  #Paroxysmal atrial fibrillation: Patient on Eliquis at home is being held due to patient's difficulty swallowing, she has a dysphagia diet at this time.  Currently paced in the 60s.  Not reporting any symptoms at this time.  -Hold eliquis for now, consider heparin drip  #Complete heart block s/p pacemaker placement: Pacemaker last evaluated in January, she is currently in a paced rhythm.  #Hypokalemia: Potassium down to 2.2 during admission, will need to replete.  Holding Lasix in setting of hypokalemia at this time.  #Acute on chronic kidney disease: Baseline creatinine of 1.6-1.8, creatinine on admission was 2.3 and BUN 102.  Nausea symptoms are concerning for uremic  encephalopathy, nephrology following, does not feel she is candidate for dialysis.   #Acute encephalopathy: Initially presented with increased lethargy and altered mental status.  CT head negative.  Mental status appears to have improved today patient was more alert.  Plan per primary  For questions or updates, please contact [place] Please consult [email or web address] for contact info under Cardiology/STEMI.   Signed, Asencion Noble, MD  07/12/2019 2:41 PM   The patient was seen, examined and discussed with Minette Brine , PA-C and I agree with the above.   85 y.o. female with a hx of chronic systolic CHD/NICM, prior complete heart block, OSA, and paroxysmal atrial fibrillation(on Eliquis), on home hospice for CHF who presented with worsening lethargy and altered mental status, and worsening LE edema.  The patient has been recently admitted with hematochezia at the end of September and was discharged to home hospice. On arrival to the ED she was noted to be afebrile, tachypneic, heart rate in the 60s, hypotensive down to 82/65.  CT scan showed no acute intracranial hemorrhage, with age-related atrophy.  Neurology was consulted, felt that it was more consistent with encephalopathy.  Labs are significant for BUN of 102 and creatinine of 2.0.  Concern for uremic encephalopathy so nephrology was consulted.  In the ED patient was given 80 mg IV Lasix due to concern for fluid overload, potassium down to 2.2 which is being repleted.  On physical exam she has elevated JVDs  Up to jaw, S1-S2, crackles at both lungs up to mid lung fields, bilateral lower extremity pitting edema.  Per granddaughter she has been losing significant amount of weight despite swelling.  Assessment and plan: Acute on chronic combined systolic diastolic CHF, ATTR cardiac amyloidosis,ECHO 07/21/2018 with EF 35-40%, severe TR Acute on chronic kidney failure Hypokalemia Failure to Thrive secondary to End Stage HF Paroxysmal  atrial fibrillation -currently in sinus rhythm CHB: Medtronic CRT-P.  Chronic Hypoxic Respiratory Failure  Given significant uremia with mental status changes I agree with nephrology to hold diuretics apply compression socks.  Unfortunately this patient has end-stage heart failure not responding to oral Bumex and worsening kidney failure, as well as failure to thrive, she would not be a candidate for any further advanced cardiac therapies.  She should be made comfortable, goals of care should be discussed with the family  and patient should be sent home with home hospice again.  Ena Dawley, MD  07/12/2019

## 2019-07-13 DIAGNOSIS — N189 Chronic kidney disease, unspecified: Secondary | ICD-10-CM

## 2019-07-13 DIAGNOSIS — I5023 Acute on chronic systolic (congestive) heart failure: Secondary | ICD-10-CM

## 2019-07-13 DIAGNOSIS — N179 Acute kidney failure, unspecified: Secondary | ICD-10-CM

## 2019-07-13 LAB — COMPREHENSIVE METABOLIC PANEL
ALT: 15 U/L (ref 0–44)
AST: 37 U/L (ref 15–41)
Albumin: 3.2 g/dL — ABNORMAL LOW (ref 3.5–5.0)
Alkaline Phosphatase: 75 U/L (ref 38–126)
Anion gap: 19 — ABNORMAL HIGH (ref 5–15)
BUN: 68 mg/dL — ABNORMAL HIGH (ref 8–23)
CO2: 26 mmol/L (ref 22–32)
Calcium: 8.9 mg/dL (ref 8.9–10.3)
Chloride: 87 mmol/L — ABNORMAL LOW (ref 98–111)
Creatinine, Ser: 2.09 mg/dL — ABNORMAL HIGH (ref 0.44–1.00)
GFR calc Af Amer: 24 mL/min — ABNORMAL LOW (ref >60)
GFR calc non Af Amer: 21 mL/min — ABNORMAL LOW (ref >60)
Glucose, Bld: 115 mg/dL — ABNORMAL HIGH (ref 70–99)
Potassium: <2 mmol/L — CL (ref 3.5–5.1)
Sodium: 132 mmol/L — ABNORMAL LOW (ref 135–145)
Total Bilirubin: 9 mg/dL — ABNORMAL HIGH (ref 0.3–1.2)
Total Protein: 7.1 g/dL (ref 6.5–8.1)

## 2019-07-13 LAB — MAGNESIUM: Magnesium: 2.4 mg/dL (ref 1.7–2.4)

## 2019-07-13 LAB — GLUCOSE, CAPILLARY
Glucose-Capillary: 96 mg/dL (ref 70–99)
Glucose-Capillary: 142 mg/dL — ABNORMAL HIGH (ref 70–99)
Glucose-Capillary: 190 mg/dL — ABNORMAL HIGH (ref 70–99)

## 2019-07-13 LAB — CBC
HCT: 39.3 % (ref 36.0–46.0)
Hemoglobin: 13.1 g/dL (ref 12.0–15.0)
MCH: 29 pg (ref 26.0–34.0)
MCHC: 33.3 g/dL (ref 30.0–36.0)
MCV: 86.9 fL (ref 80.0–100.0)
Platelets: 163 10*3/uL (ref 150–400)
RBC: 4.52 MIL/uL (ref 3.87–5.11)
RDW: 20 % — ABNORMAL HIGH (ref 11.5–15.5)
WBC: 2.4 10*3/uL — ABNORMAL LOW (ref 4.0–10.5)
nRBC: 0 % (ref 0.0–0.2)

## 2019-07-13 LAB — PHOSPHORUS: Phosphorus: 4.2 mg/dL (ref 2.5–4.6)

## 2019-07-13 LAB — PROTIME-INR
INR: 1.6 — ABNORMAL HIGH (ref 0.8–1.2)
Prothrombin Time: 18.6 seconds — ABNORMAL HIGH (ref 11.4–15.2)

## 2019-07-13 LAB — POTASSIUM
Potassium: <2 mmol/L — CL (ref 3.5–5.1)
Potassium: 2.4 mmol/L — CL (ref 3.5–5.1)

## 2019-07-13 MED ORDER — POTASSIUM CHLORIDE 10 MEQ/100ML IV SOLN
10.0000 meq | INTRAVENOUS | Status: AC
  Administered 2019-07-13 (×4): 10 meq via INTRAVENOUS
  Filled 2019-07-13 (×4): qty 100

## 2019-07-13 MED ORDER — POTASSIUM CHLORIDE 10 MEQ/100ML IV SOLN
10.0000 meq | INTRAVENOUS | Status: DC
  Administered 2019-07-13 (×2): 10 meq via INTRAVENOUS
  Filled 2019-07-13 (×2): qty 100

## 2019-07-13 MED ORDER — POTASSIUM CHLORIDE CRYS ER 20 MEQ PO TBCR
40.0000 meq | EXTENDED_RELEASE_TABLET | Freq: Once | ORAL | Status: AC
  Administered 2019-07-13: 40 meq via ORAL
  Filled 2019-07-13: qty 2

## 2019-07-13 MED ORDER — POTASSIUM CHLORIDE CRYS ER 20 MEQ PO TBCR
40.0000 meq | EXTENDED_RELEASE_TABLET | Freq: Once | ORAL | Status: AC
  Administered 2019-07-13: 15:00:00 40 meq via ORAL
  Filled 2019-07-13: qty 2

## 2019-07-13 NOTE — Progress Notes (Signed)
CRITICAL VALUE ALERT  Critical Value:  K 2.4  Date & Time Notied:  07/13/2019; 17:37 o'clock   Provider Notified: Dr. Lonny Prude  Orders Received/Actions taken:

## 2019-07-13 NOTE — Progress Notes (Signed)
Admit: 07/11/2019 LOS: 2  22F CKD3 BL 1.2 to 1.8, HFREF, admit with AoCKD3  wth progressive weakness and edema, AMS  Subjective:  Marland Kitchen Hypokalemia, being repleted . Granddaughter at bedside, discussed status . Stable creatinine at 2.1  11/12 0701 - 11/13 0700 In: 940 [P.O.:640; IV Piggyback:300] Out: 6979 [Urine:1550]  There were no vitals filed for this visit.  Scheduled Meds: . Chlorhexidine Gluconate Cloth  6 each Topical Q0600  . heparin  5,000 Units Subcutaneous Q8H  . lactulose  10 g Oral TID  . latanoprost  1 drop Both Eyes QHS  . mupirocin ointment  1 application Nasal BID  . pantoprazole (PROTONIX) IV  40 mg Intravenous Q24H  . sodium chloride flush  3 mL Intravenous Q12H  . timolol  1 drop Both Eyes QHS   Continuous Infusions: . sodium chloride    . potassium chloride 10 mEq (07/13/19 1251)   PRN Meds:.sodium chloride, sodium chloride flush  Current Labs: reviewed    Physical Exam:  Blood pressure (!) 83/69, pulse 66, temperature 98 F (36.7 C), temperature source Oral, resp. rate 20, SpO2 93 %. Elderly female, chronically ill appaering Regular Diminished throughout esp in bases 2-3+ LEE S/nd   A 1. AMS, ? Hepatic encephalopathy 2. AoCKD4 BL SCr 1.5 to 2.0 3. CHF, on hospice 4. HTN 5. AFib 6. Hypokalemia  P . Discussed with granddaughter, recommended stabilizing and improving hypokalemia and transitioning back to home on hospice therapy. She agrees. . Patient is not a candidate nor would she benefit from dialysis . Can use diuretics as clinically indicated, I would not provide at the current time . No further suggestions, will sign off, call for any questions or concerns   Pearson Grippe MD 07/13/2019, 1:09 PM  Recent Labs  Lab 07/11/19 1437 07/11/19 1513 07/11/19 2322 07/12/19 0332 07/12/19 1915 07/13/19 0657  NA 129* 131*  --  135  --  132*  K 3.5 3.3*  --  2.2* <2.0* <2.0*  CL 86* 86*  --  91*  --  87*  CO2 21*  --   --  24  --  26   GLUCOSE 128* 127*  --  82  --  115*  BUN 74* 102*  --  74*  --  68*  CREATININE 2.30* 2.00*  --  2.16*  --  2.09*  CALCIUM 8.7*  --   --  9.0  --  8.9  PHOS  --   --  4.6 4.6  --  4.2   Recent Labs  Lab 07/11/19 1437 07/11/19 1513 07/12/19 0332 07/13/19 0657  WBC 4.2  --  3.7* 2.4*  NEUTROABS 3.4  --   --   --   HGB 13.9 16.3* 13.2 13.1  HCT 41.2 48.0* 40.6 39.3  MCV 87.3  --  89.4 86.9  PLT 187  --  158 163

## 2019-07-13 NOTE — Progress Notes (Signed)
  Speech Language Pathology Treatment: Dysphagia  Patient Details Name: Kim Mullins MRN: [id number] DOB: 1930-07-11 Today's Date: 07/13/2019 Time: 7505-1833 SLP Time Calculation (min) (ACUTE ONLY): 10 min  Assessment / Plan / Recommendation Clinical Impression  Pt was seen for skilled ST targeting dysphagia goals.  SLP facilitated the session with a functional snack of regular textures and thin liquids to assess toleration of currently prescribed diet.  Intake was limited due to pt fatigue; however, pt consumed regular textures and thin liquids with no overt s/s of aspiration.  Pt's granddaughter was present and reported good toleration of dinner last night and breakfast this morning.  Mild belching was noted after consecutive sips of thin liquids but no indicators of post prandial airway compromise.  As a result, I recommend that pt remain on her currently prescribed diet.  No further ST needs are indicated at this time as pt is back to tolerating her baseline diet.    HPI HPI: Lovinia Snare is a 85 y.o. female with medical history significant of Congestive heart failure with systolic dysfunction ejection fraction 30 to 35%, status post pacemaker secondary to complete AV block, atrial fibrillation, CVA, GERD, hypertension, hospice patient at home because of severe congestive heart failure came with a chief complaint of change in mental status. CT 11/11 revealed no acute findings.  No chest imaging at present.      SLP Plan  Discharge SLP treatment due to (comment);All goals met       Recommendations  Diet recommendations: Regular;Thin liquid Liquids provided via: Cup;Straw Medication Administration: Whole meds with liquid Supervision: Staff to assist with self feeding Compensations: Minimize environmental distractions;Slow rate;Small sips/bites Postural Changes and/or Swallow Maneuvers: Seated upright 90 degrees;Upright 30-60 min after meal                Oral Care  Recommendations: Oral care BID Follow up Recommendations: None SLP Visit Diagnosis: Dysphagia, unspecified (R13.10) Plan: Discharge SLP treatment due to (comment);All goals met       GO                Gwendolyne Welford, Selinda Orion 07/13/2019, 12:19 PM

## 2019-07-13 NOTE — Progress Notes (Signed)
Palliative Medicine RN Note: Consult order noted. Patient is GIP with [facility] I spoke with [facility] liaison Anderson Malta. They are already engaged with the patient and her family regarding [place]. At this time, they do not feel that PMT involvement would be helpful. They will continue discussions move towards pure comfort care and will transfer to [facility] if and when it is appropriate and in line with patient and family goals.  At this time, PMT will not see the patient.  Marjie Skiff Jamicah Anstead, RN, BSN, [facility] Palliative Medicine Team 07/13/2019 3:02 PM Office [phone]

## 2019-07-13 NOTE — Progress Notes (Signed)
Patient ID: Kim Mullins, female   DOB: 1929/09/22, 85 y.o.   MRN: [id number]     Advanced Heart Failure Rounding Note  PCP-Cardiologist: No primary care provider on file.   Subjective:    Alert this morning, some confusion.  No dyspnea.    K < 2.   Tbili 9   Objective:   Weight Range:   There is no height or weight on file to calculate BMI.   Vital Signs:   Temp:  [97.9 F (36.6 C)-98.5 F (36.9 C)] 97.9 F (36.6 C) (11/13 0754) Pulse Rate:  [62-68] 65 (11/13 0754) Resp:  [13-22] 18 (11/13 0831) BP: (90-100)/(64-83) 91/69 (11/13 0754) SpO2:  [94 %-98 %] 94 % (11/13 0754) Last BM Date: 07/13/19  Weight change: There were no vitals filed for this visit.  Intake/Output:   Intake/Output Summary (Last 24 hours) at 07/13/2019 0854 Last data filed at 07/13/2019 0848 Gross per 24 hour  Intake 840 ml  Output 1550 ml  Net -710 ml      Physical Exam    General:  Well appearing. No resp difficulty HEENT: Normal Neck: Supple. JVP 8 cm. Carotids 2+ bilat; no bruits. No lymphadenopathy or thyromegaly appreciated. Cor: PMI nondisplaced. Regular rate & rhythm. No rubs, gallops or murmurs. Lungs: Clear Abdomen: Soft, nontender, nondistended. No hepatosplenomegaly. No bruits or masses. Good bowel sounds. Extremities: No cyanosis, clubbing, rash, edema Neuro: Alert & orientedx3, cranial nerves grossly intact. moves all 4 extremities w/o difficulty. Affect pleasant   Telemetry   A-V sequential pacing  Labs    CBC Recent Labs    07/11/19 1437  07/12/19 0332 07/13/19 0657  WBC 4.2  --  3.7* 2.4*  NEUTROABS 3.4  --   --   --   HGB 13.9   < > 13.2 13.1  HCT 41.2   < > 40.6 39.3  MCV 87.3  --  89.4 86.9  PLT 187  --  158 163   < > = values in this interval not displayed.   Basic Metabolic Panel Recent Labs    07/12/19 0332 07/12/19 1915 07/13/19 0657  NA 135  --  132*  K 2.2* <2.0* <2.0*  CL 91*  --  87*  CO2 24  --  26  GLUCOSE 82  --  115*   BUN 74*  --  68*  CREATININE 2.16*  --  2.09*  CALCIUM 9.0  --  8.9  MG 2.4  --  2.4  PHOS 4.6  --  4.2   Liver Function Tests Recent Labs    07/12/19 0332 07/13/19 0657  AST 39 37  ALT 16 15  ALKPHOS 77 75  BILITOT 8.5* 9.0*  PROT 6.9 7.1  ALBUMIN 3.1* 3.2*   No results for input(s): LIPASE, AMYLASE in the last 72 hours. Cardiac Enzymes Recent Labs    07/11/19 2322  CKTOTAL 68    BNP: BNP (last 3 results) Recent Labs    05/15/19 0516 06/11/19 1327 07/11/19 2322  BNP 1,217.6* 1,237.5* 1,753.6*    ProBNP (last 3 results) No results for input(s): PROBNP in the last 8760 hours.   D-Dimer No results for input(s): DDIMER in the last 72 hours. Hemoglobin A1C No results for input(s): HGBA1C in the last 72 hours. Fasting Lipid Panel No results for input(s): CHOL, HDL, LDLCALC, TRIG, CHOLHDL, LDLDIRECT in the last 72 hours. Thyroid Function Tests Recent Labs    07/11/19 2322  TSH 3.019    Other results:  Imaging     No results found.   Medications:     Scheduled Medications: . Chlorhexidine Gluconate Cloth  6 each Topical Q0600  . heparin  5,000 Units Subcutaneous Q8H  . lactulose  10 g Oral TID  . latanoprost  1 drop Both Eyes QHS  . mupirocin ointment  1 application Nasal BID  . pantoprazole (PROTONIX) IV  40 mg Intravenous Q24H  . sodium chloride flush  3 mL Intravenous Q12H  . timolol  1 drop Both Eyes QHS     Infusions: . sodium chloride    . potassium chloride 10 mEq (07/13/19 0848)     PRN Medications:  sodium chloride, sodium chloride flush    Assessment/Plan   1. Failure to Thrive:  Secondary to End Stage HF.  Now with liver failure and confusion, tbili 9.  - Agree with lactulose.  - She had been set up for home hospice but came back in hospital due to mental status changes.  She remains DNR/DNI.  I think that she should go back home with hospice after correction of K, she is open to that plan though mildly confused. Will  need to discuss with grand-daughter when she comes in.  2. Chronic systolic CHF: Nonischemic cardiomyopathy. MDT CRT-P device. ECHO 07/21/2018 with EF 35-40%, severe TR. 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 Patient has had progressive worsening of dyspnea and volume overload. This is complicated by CKD stage IV. Volume status stable currently.   3. 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 I think that with advanced age and advanced HF, she is unlikely to benefit significantly from tafamidis. 4. Atrial fibrillation: Paroxysmal. Maintaining NSR.  - She has been off Eliquis with hospice.    5. CKD Stage IV: Creatinine baseline 1.5- 2.Creatinine 2 today .  6. CHB: [employer] CRT-P.  7. Chronic Hypoxic Respiratory Failure: continue 2 liters oxygen  8. Confusion: Tbili 9, suspect hepatic encephalopathy in setting of long-standing RV failure.   - Continue lactulose.   As above, further aggressive cardiac care is unlikely to help her at this point with end-stage CHF.  Agree with resumption of hospice services at home.    Length of Stay: 2  [name], MD  07/13/2019, 8:54 AM  Advanced Heart Failure Team Pager [phone] (M-F; 7a - 4p)  Please contact [facility] for night-coverage after hours (4p -7a ) and weekends on [email or web address]

## 2019-07-13 NOTE — Progress Notes (Signed)
Chaplain was paged and asked if he/she can come and talk with the patient per granddaughter request, patient feeling depressed. Will continue to monitor.

## 2019-07-13 NOTE — Progress Notes (Signed)
PROGRESS NOTE    Kim Mullins  TGG:[id number] DOB: 30-Jun-1930 DOA: 07/11/2019 PCP: Lin Landsman, MD   Brief Narrative: Kim Mullins is a 85 y.o. femalewith medical history significant of Congestive heart failure with systolic dysfunction ejection fraction 30 to 35%, status post pacemaker secondary to complete AV block, atrial fibrillation, CVA, hypertension. Patient presented secondary to confusion and found to have hepatic encephalopathy. Improving with lactulose but admission complicated by hypokalemia.   Assessment & Plan:   Principal Problem:   Acute encephalopathy Active Problems:   Esophageal stricture   Pulmonary HTN (HCC)   DCM (dilated cardiomyopathy) (HCC)   Benign essential HTN   Acute on chronic systolic CHF (congestive heart failure) (HCC)   OSA (obstructive sleep apnea)   Pacemaker   History of stroke   Atrial fibrillation (HCC)   CKD (chronic kidney disease), stage V (HCC)   Acute kidney injury superimposed on chronic kidney disease ([place])   Acute hepatic encephalopathy   Acute metabolic encephalopathy Hepatic encephalopathy Patient appears to be/have developed liver injury. Ammonia level of 95 on admission. Bilirubin of 10.3 on admission. INR obtained today and is 1.6. Mental status is improved per daughter but she does not feel it is at baseline. -Continue Lactulose  Chronic systolic heart failure Last EF of 35-40%. Patient was transitioned to hospice care prior to admission.   Hypokalemia Severe. In setting of diureses. -Replete  AKI on CKD stage IV Baseline creatinine of about 1.7. Creatinine of 2.3 on admission and has improved slightly. Managed initially with Lasix IV but   Hypokalemia -Potassium supplementation -Repeat potassium lab this evening  Chronic atrial fibrillation Patient is on Eliquis as an outpatient which has apparently been discontinued since establishing with hospice. Started on heparin drip which is now  discontinued.  Complete AV block S/p pacemaker   DVT prophylaxis: Heparin subq Code Status:   Code Status: DNR Family Communication: Granddaughter at bedside Disposition Plan: Discharge pending continued goals of care   Consultants:   Cardiology  Nephrology  Palliative care medicine  Procedures:   None  Antimicrobials:  None    Subjective: No issues today. No chest pain or dyspnea.  Objective: Vitals:   07/13/19 0000 07/13/19 0401 07/13/19 0750 07/13/19 0751  BP: 90/71 91/64 96/71    Pulse: 64 65    Resp: 18 16 13 20   Temp: 98.1 F (36.7 C) 98.5 F (36.9 C) 97.9 F (36.6 C)   TempSrc: Oral Oral Oral   SpO2: 97% 97% 94%     Intake/Output Summary (Last 24 hours) at 07/13/2019 0759 Last data filed at 07/13/2019 0100 Gross per 24 hour  Intake 840 ml  Output 1550 ml  Net -710 ml   There were no vitals filed for this visit.  Examination:  General exam: Appears calm and comfortable Respiratory system: Clear to auscultation. Respiratory effort normal. Cardiovascular system: S1 & S2 heard, normal rhythm. No murmurs, rubs, gallops or clicks. Gastrointestinal system: Abdomen is nondistended, soft and nontender. No organomegaly or masses felt. Normal bowel sounds heard. Central nervous system: Alert and oriented to person and place. Extremities: LE edema. No calf tenderness Skin: No cyanosis. No rashes Psychiatry: Judgement and insight appear normal. Mood & affect appropriate.     Data Reviewed: I have personally reviewed following labs and imaging studies  CBC: Recent Labs  Lab 07/11/19 1437 07/11/19 1513 07/12/19 0332 07/13/19 0657  WBC 4.2  --  3.7* 2.4*  NEUTROABS 3.4  --   --   --  HGB 13.9 16.3* 13.2 13.1  HCT 41.2 48.0* 40.6 39.3  MCV 87.3  --  89.4 86.9  PLT 187  --  158 431   Basic Metabolic Panel: Recent Labs  Lab 07/11/19 1437 07/11/19 1513 07/11/19 2322 07/12/19 0332 07/12/19 1915  NA 129* 131*  --  135  --   K 3.5 3.3*  --   2.2* <2.0*  CL 86* 86*  --  91*  --   CO2 21*  --   --  24  --   GLUCOSE 128* 127*  --  82  --   BUN 74* 102*  --  74*  --   CREATININE 2.30* 2.00*  --  2.16*  --   CALCIUM 8.7*  --   --  9.0  --   MG  --   --   --  2.4  --   PHOS  --   --  4.6 4.6  --    GFR: Estimated Creatinine Clearance: 15.5 mL/min (A) (by C-G formula based on SCr of 2.16 mg/dL (H)). Liver Function Tests: Recent Labs  Lab 07/11/19 1437 07/12/19 0332  AST 88* 39  ALT 16 16  ALKPHOS 92 77  BILITOT 10.3* 8.5*  PROT 7.5 6.9  ALBUMIN 3.5 3.1*   No results for input(s): LIPASE, AMYLASE in the last 168 hours. Recent Labs  Lab 07/12/19 0552  AMMONIA 95*   Coagulation Profile: No results for input(s): INR, PROTIME in the last 168 hours. Cardiac Enzymes: Recent Labs  Lab 07/11/19 2322  CKTOTAL 68   BNP (last 3 results) No results for input(s): PROBNP in the last 8760 hours. HbA1C: No results for input(s): HGBA1C in the last 72 hours. CBG: Recent Labs  Lab 07/11/19 2142  GLUCAP 98   Lipid Profile: No results for input(s): CHOL, HDL, LDLCALC, TRIG, CHOLHDL, LDLDIRECT in the last 72 hours. Thyroid Function Tests: Recent Labs    07/11/19 2322  TSH 3.019   Anemia Panel: No results for input(s): VITAMINB12, FOLATE, FERRITIN, TIBC, IRON, RETICCTPCT in the last 72 hours. Sepsis Labs: No results for input(s): PROCALCITON, LATICACIDVEN in the last 168 hours.  Recent Results (from the past 240 hour(s))  SARS CORONAVIRUS 2 (TAT 6-24 HRS) Nasopharyngeal Nasopharyngeal Swab     Status: None   Collection Time: 07/11/19  7:22 PM   Specimen: Nasopharyngeal Swab  Result Value Ref Range Status   SARS Coronavirus 2 NEGATIVE NEGATIVE Final    Comment: (NOTE) SARS-CoV-2 target nucleic acids are NOT DETECTED. The SARS-CoV-2 RNA is generally detectable in upper and lower respiratory specimens during the acute phase of infection. Negative results do not preclude SARS-CoV-2 infection, do not rule out  co-infections with other pathogens, and should not be used as the sole basis for treatment or other patient management decisions. Negative results must be combined with clinical observations, patient history, and epidemiological information. The expected result is Negative. Fact Sheet for Patients: SugarRoll.be Fact Sheet for Healthcare Providers: https://www.woods-mathews.com/ This test is not yet approved or cleared by the [place] FDA and  has been authorized for detection and/or diagnosis of SARS-CoV-2 by FDA under an Emergency Use Authorization (EUA). This EUA will remain  in effect (meaning this test can be used) for the duration of the COVID-19 declaration under Section 56 4(b)(1) of the Act, 21 U.S.C. section 360bbb-3(b)(1), unless the authorization is terminated or revoked sooner. Performed at [facility], [place] [address], [place], [place] [address]   MRSA PCR Screening  Status: Abnormal   Collection Time: 07/12/19 12:45 PM   Specimen: Nasal Mucosa; Nasopharyngeal  Result Value Ref Range Status   MRSA by PCR POSITIVE (A) NEGATIVE Final    Comment:        The GeneXpert MRSA Assay (FDA approved for NASAL specimens only), is one component of a comprehensive MRSA colonization surveillance program. It is not intended to diagnose MRSA infection nor to guide or monitor treatment for MRSA infections. RESULT CALLED TO, READ BACK BY AND VERIFIED WITH: Adah Salvage RN 14:50 07/12/19 ([email or web address]) Performed at [facility], [place] [address] [place], [place] [address]          Radiology Studies: Ct Head Wo Contrast  Result Date: 07/11/2019 CLINICAL DATA:  85 year old female with focal neurologic deficit greater than 6 hours. Possible stroke. EXAM: CT HEAD WITHOUT CONTRAST TECHNIQUE: Contiguous axial images were obtained from the base of the skull through the vertex without intravenous contrast. COMPARISON:  Head CT dated  05/14/2019. FINDINGS: Brain: Mild-to-moderate age-related atrophy and chronic microvascular ischemic changes. There is no acute intracranial hemorrhage. No mass effect or midline shift. No extra-axial fluid collection. Vascular: No hyperdense vessel or unexpected calcification. Skull: Normal. Negative for fracture or focal lesion. Sinuses/Orbits: No acute finding. Other: None IMPRESSION: 1. No acute intracranial hemorrhage. 2. Age-related atrophy and chronic microvascular ischemic changes. Electronically Signed   By: Anner Crete M.D.   On: 07/11/2019 16:27   US Renal  Result Date: 07/11/2019 CLINICAL DATA:  Acute kidney injury EXAM: RENAL / URINARY TRACT ULTRASOUND COMPLETE COMPARISON:  CT dated 05/14/2019. FINDINGS: Right Kidney: Renal measurements: 10.6 x 3.8 x 4 cm = volume: 85.1 mL . Echogenicity within normal limits. No mass or hydronephrosis visualized. Left Kidney: Renal measurements: 7.8 x 3 x 3.9 cm = volume: 48 mL. Echogenicity within normal limits. No mass or hydronephrosis visualized. Bladder: Appears normal for degree of bladder distention. The ureteral jets were not visualized. Other: There is a right-sided pleural effusion. IMPRESSION: 1. No acute sonographic abnormality.  No hydronephrosis. 2. Incidentally noted right-sided pleural effusion. Electronically Signed   By: Constance Holster M.D.   On: 07/11/2019 22:45        Scheduled Meds: . Chlorhexidine Gluconate Cloth  6 each Topical Q0600  . heparin  5,000 Units Subcutaneous Q8H  . lactulose  10 g Oral TID  . latanoprost  1 drop Both Eyes QHS  . mupirocin ointment  1 application Nasal BID  . pantoprazole (PROTONIX) IV  40 mg Intravenous Q24H  . sodium chloride flush  3 mL Intravenous Q12H  . timolol  1 drop Both Eyes QHS   Continuous Infusions: . sodium chloride    . potassium chloride       LOS: 2 days     Cordelia Poche, MD [facility] 07/13/2019, 7:59 AM  If 7PM-7AM, please contact night-coverage  [email or web address]

## 2019-07-13 NOTE — Progress Notes (Signed)
[place] ([facility]) - GIP RN Note 1230    This a related and covered GIP admission with a [facility] diagnosis of CHF per Dr. Tomasa Hosteller. Patient was transported to the ED for evaluation of altered level of consciousness on 07/11/19.   Family elected to call EMS, after visit with hospice RN, for further evaluation. Patient is admitted with acute encephalopathy. Patient is a DNR.   Checked in with bedside RN, who stated that patient is currently being treated with IV Potassium due to K level below 2. RN also added that patient had an uneventful night and more alert this shift but experiencing SOB with activity today, continued low BP and increased edema(+2) in extremities.  Also spoke with patient granddaughter via telephone who was comforting patient at bedside. Granddaughter stated that patient is more alert with some confusion and seems to have developed BUE tremors and discomfort RT the IV K which the RN is aware. Supported family and encouraged to call Hospice contact number for needs, making aware that Hospice will continue to follow daily while patient hospitalized.     V/S: 98, 65,20,83/69, 93% sats on 2L O2 via [place]  I&O: 420/750 Abnormal lab work: 07/13/2019 - WBC 2.4, NA 132, K ,2, Chl 87, BUN 68, Create 2.09, INR 1.6, Glucose 115,  IVs/PRNs: Potassiym chloride 10 mEq in 148ml IVPB q1hrxs4 starting at 1128 this am.   EPIC MD Problem list: Cardiologist Consult:  -Failure to Thrive: Secondary to End Stage HF.  Now with liver failure and confusion, tbili 9. -continue with lactulose.  -Chronic systolic CHF: Nonischemic cardiomyopathy.  MDT CRT-P device.  ECHO 07/21/2018 with EF 35-40%, severe TR.  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  Patient has had progressive worsening of dyspnea and volume overload complicated by CKD (Creat baseline of 1.5 and 2.0 today). Continue with 2L Oxygen Agree with home with hospice after correction of K.  Nephrology Consult:   Hepatic Encephalopathy - AFIB- CHF- HTN: Hold diuretics for 24h-Not a candidate for HD-Daily weights, Daily Renal Panel, Strict I/Os, Avoid nephrotoxins.    D/C planning: will continue to assess needs  - MD noted: patient to be discharged when medically stable. Please use [facility] for all transportation needs.  Goals of Care: Patient now a DNR, Patient family wishes to go home when patient medically stable with support of hospice.  Communication with IDT: Updated [place] team  Communication with [facility]: Supported/updated patient granddaughter via telephone. Encouraged to call for assistance as needed.    Please call with any hospice related questions/concerns,   Gar Ponto, RN [place] (in [place]) [phone]

## 2019-07-13 NOTE — Progress Notes (Signed)
CRITICAL VALUE ALERT  Critical Value:  K < 2  Date & Time Notied:  07/13/2019; 08:35 o'clock  Provider Notified: Dr. Lonny Prude  Orders Received/Actions taken: see MAR

## 2019-07-13 NOTE — Progress Notes (Signed)
Lab called staff again to report K level < 2 from last night. MD aware. Will continue to monitor.

## 2019-07-13 NOTE — Progress Notes (Signed)
Chaplain responded to consult for depressed and anxious pt. Kim Mullins was happy to speak with this Chaplain. Her granddaughter left the room to allow [place] privacy in conversation. [name] expressed that she feels like she is finished here in this life. She is ready to be with her Lord. Kim Mullins has nothing left that she feels needs doing and she is ready for some rest. Kim Mullins said before she leaves this world she would like to talk to her friends, Baxter Flattery and [place] on the phone. Once she has talked to her friends Kim Mullins said she, "will be ready to go." Chaplain remains available as needs arise.  Chaplain Resident, Evelene Croon, M Div

## 2019-07-14 DIAGNOSIS — G9340 Encephalopathy, unspecified: Secondary | ICD-10-CM

## 2019-07-14 LAB — POTASSIUM
Potassium: 2.6 mmol/L — CL (ref 3.5–5.1)
Potassium: 3 mmol/L — ABNORMAL LOW (ref 3.5–5.1)

## 2019-07-14 LAB — GLUCOSE, CAPILLARY: Glucose-Capillary: 99 mg/dL (ref 70–99)

## 2019-07-14 LAB — MAGNESIUM: Magnesium: 2.4 mg/dL (ref 1.7–2.4)

## 2019-07-14 MED ORDER — POTASSIUM CHLORIDE CRYS ER 20 MEQ PO TBCR
40.0000 meq | EXTENDED_RELEASE_TABLET | Freq: Once | ORAL | Status: AC
  Administered 2019-07-14: 40 meq via ORAL
  Filled 2019-07-14: qty 2

## 2019-07-14 MED ORDER — POTASSIUM CHLORIDE CRYS ER 20 MEQ PO TBCR
40.0000 meq | EXTENDED_RELEASE_TABLET | ORAL | Status: AC
  Administered 2019-07-14 (×2): 40 meq via ORAL
  Filled 2019-07-14 (×2): qty 2

## 2019-07-14 MED ORDER — POTASSIUM CHLORIDE 10 MEQ/100ML IV SOLN
10.0000 meq | INTRAVENOUS | Status: AC
  Administered 2019-07-14 (×4): 10 meq via INTRAVENOUS
  Filled 2019-07-14 (×4): qty 100

## 2019-07-14 NOTE — Progress Notes (Signed)
PROGRESS NOTE    Kenya Kook  HGD:[id number] DOB: [birth year] DOA: 07/11/2019 PCP: Lin Landsman, MD   Brief Narrative: Kim Mullins is a 85 y.o. femalewith medical history significant of Congestive heart failure with systolic dysfunction ejection fraction 30 to 35%, status post pacemaker secondary to complete AV block, atrial fibrillation, CVA, hypertension. Patient presented secondary to confusion and found to have hepatic encephalopathy. Improving with lactulose but admission complicated by hypokalemia.   Assessment & Plan:   Principal Problem:   Acute encephalopathy Active Problems:   Esophageal stricture   Pulmonary HTN (HCC)   DCM (dilated cardiomyopathy) (HCC)   Benign essential HTN   Acute on chronic systolic CHF (congestive heart failure) (HCC)   OSA (obstructive sleep apnea)   Pacemaker   History of stroke   Atrial fibrillation (HCC)   CKD (chronic kidney disease), stage V (HCC)   Acute kidney injury superimposed on chronic kidney disease ([place])   Acute hepatic encephalopathy   Acute metabolic encephalopathy Hepatic encephalopathy Patient appears to be/have developed liver injury. Ammonia level of 95 on admission. Bilirubin of 10.3 on admission. INR obtained today and is 1.6. Mental status is improved per daughter but she does not feel it is at baseline. -Continue Lactulose  Chronic systolic heart failure Last EF of 35-40%. Patient was transitioned to hospice care prior to admission.   Hypokalemia Severe. In setting of diureses. Potassium improved this morning up to 3 -Potassium supplementation  AKI on CKD stage IV Baseline creatinine of about 1.7. Creatinine of 2.3 on admission and has improved slightly. Managed initially with Lasix IV but not tolerating secondary to hypotension.   Chronic atrial fibrillation Patient is on Eliquis as an outpatient which has apparently been discontinued since establishing with hospice. Started on heparin  drip which is now discontinued.  Complete AV block S/p pacemaker   DVT prophylaxis: Heparin subq Code Status:   Code Status: DNR Family Communication: Granddaughter on the telephone Disposition Plan: Discharge pending continued goals of care   Consultants:   Cardiology  Nephrology  Palliative care medicine  Procedures:   None  Antimicrobials:  None    Subjective: No concerns  Objective: Vitals:   07/14/19 0005 07/14/19 0400 07/14/19 0829 07/14/19 1229  BP: 97/79 (!) 83/65 91/76 98/81   Pulse: 75 63 61 60  Resp:   (!) 22 (!) 22  Temp: 98.6 F (37 C) 98.2 F (36.8 C) 98 F (36.7 C)   TempSrc: Oral Oral Oral   SpO2: 100% 97% 90% 97%    Intake/Output Summary (Last 24 hours) at 07/14/2019 1633 Last data filed at 07/14/2019 0552 Gross per 24 hour  Intake 660 ml  Output 500 ml  Net 160 ml   There were no vitals filed for this visit.  Examination:  General exam: Appears calm and comfortable Respiratory system: Clear to auscultation. Respiratory effort normal. Cardiovascular system: S1 & S2 heard, RRR. No murmurs, rubs, gallops or clicks. Gastrointestinal system: Abdomen is nondistended, soft and nontender. No organomegaly or masses felt. Normal bowel sounds heard. Central nervous system: Alert. Extremities: No edema. No calf tenderness Skin: No cyanosis. No rashes     Data Reviewed: I have personally reviewed following labs and imaging studies  CBC: Recent Labs  Lab 07/11/19 1437 07/11/19 1513 07/12/19 0332 07/13/19 0657  WBC 4.2  --  3.7* 2.4*  NEUTROABS 3.4  --   --   --   HGB 13.9 16.3* 13.2 13.1  HCT 41.2 48.0* 40.6 39.3  MCV 87.3  --  89.4 86.9  PLT 187  --  158 735   Basic Metabolic Panel: Recent Labs  Lab 07/11/19 1437 07/11/19 1513 07/11/19 2322 07/12/19 0332 07/12/19 1915 07/13/19 0657 07/13/19 1628 07/14/19 0239 07/14/19 1138  NA 129* 131*  --  135  --  132*  --   --   --   K 3.5 3.3*  --  2.2* <2.0* <2.0* 2.4* 2.6* 3.0*   CL 86* 86*  --  91*  --  87*  --   --   --   CO2 21*  --   --  24  --  26  --   --   --   GLUCOSE 128* 127*  --  82  --  115*  --   --   --   BUN 74* 102*  --  74*  --  68*  --   --   --   CREATININE 2.30* 2.00*  --  2.16*  --  2.09*  --   --   --   CALCIUM 8.7*  --   --  9.0  --  8.9  --   --   --   MG  --   --   --  2.4  --  2.4  --  2.4  --   PHOS  --   --  4.6 4.6  --  4.2  --   --   --    GFR: Estimated Creatinine Clearance: 16 mL/min (A) (by C-G formula based on SCr of 2.09 mg/dL (H)). Liver Function Tests: Recent Labs  Lab 07/11/19 1437 07/12/19 0332 07/13/19 0657  AST 88* 39 37  ALT 16 16 15   ALKPHOS 92 77 75  BILITOT 10.3* 8.5* 9.0*  PROT 7.5 6.9 7.1  ALBUMIN 3.5 3.1* 3.2*   No results for input(s): LIPASE, AMYLASE in the last 168 hours. Recent Labs  Lab 07/12/19 0552  AMMONIA 95*   Coagulation Profile: Recent Labs  Lab 07/13/19 1047  INR 1.6*   Cardiac Enzymes: Recent Labs  Lab 07/11/19 2322  CKTOTAL 68   BNP (last 3 results) No results for input(s): PROBNP in the last 8760 hours. HbA1C: No results for input(s): HGBA1C in the last 72 hours. CBG: Recent Labs  Lab 07/11/19 2142 07/13/19 0816 07/13/19 1223 07/13/19 1728 07/14/19 0722  GLUCAP 98 96 142* 190* 99   Lipid Profile: No results for input(s): CHOL, HDL, LDLCALC, TRIG, CHOLHDL, LDLDIRECT in the last 72 hours. Thyroid Function Tests: Recent Labs    07/11/19 2322  TSH 3.019   Anemia Panel: No results for input(s): VITAMINB12, FOLATE, FERRITIN, TIBC, IRON, RETICCTPCT in the last 72 hours. Sepsis Labs: No results for input(s): PROCALCITON, LATICACIDVEN in the last 168 hours.  Recent Results (from the past 240 hour(s))  SARS CORONAVIRUS 2 (TAT 6-24 HRS) Nasopharyngeal Nasopharyngeal Swab     Status: None   Collection Time: 07/11/19  7:22 PM   Specimen: Nasopharyngeal Swab  Result Value Ref Range Status   SARS Coronavirus 2 NEGATIVE NEGATIVE Final    Comment: (NOTE) SARS-CoV-2  target nucleic acids are NOT DETECTED. The SARS-CoV-2 RNA is generally detectable in upper and lower respiratory specimens during the acute phase of infection. Negative results do not preclude SARS-CoV-2 infection, do not rule out co-infections with other pathogens, and should not be used as the sole basis for treatment or other patient management decisions. Negative results must be combined with clinical observations, patient history, and epidemiological information. The expected result  is Negative. Fact Sheet for Patients: SugarRoll.be Fact Sheet for Healthcare Providers: https://www.woods-mathews.com/ This test is not yet approved or cleared by the [place] FDA and  has been authorized for detection and/or diagnosis of SARS-CoV-2 by FDA under an Emergency Use Authorization (EUA). This EUA will remain  in effect (meaning this test can be used) for the duration of the COVID-19 declaration under Section 56 4(b)(1) of the Act, 21 U.S.C. section 360bbb-3(b)(1), unless the authorization is terminated or revoked sooner. Performed at [facility], [place] [address], [place], [place] [address]   MRSA PCR Screening     Status: Abnormal   Collection Time: 07/12/19 12:45 PM   Specimen: Nasal Mucosa; Nasopharyngeal  Result Value Ref Range Status   MRSA by PCR POSITIVE (A) NEGATIVE Final    Comment:        The GeneXpert MRSA Assay (FDA approved for NASAL specimens only), is one component of a comprehensive MRSA colonization surveillance program. It is not intended to diagnose MRSA infection nor to guide or monitor treatment for MRSA infections. RESULT CALLED TO, READ BACK BY AND VERIFIED WITH: Adah Salvage RN 14:50 07/12/19 ([email or web address]) Performed at [facility], [place] [address] [place], [place] [address]          Radiology Studies: No results found.      Scheduled Meds: . Chlorhexidine Gluconate Cloth  6 each Topical Q0600   . heparin  5,000 Units Subcutaneous Q8H  . lactulose  10 g Oral TID  . latanoprost  1 drop Both Eyes QHS  . mupirocin ointment  1 application Nasal BID  . pantoprazole (PROTONIX) IV  40 mg Intravenous Q24H  . potassium chloride  40 mEq Oral Q4H  . sodium chloride flush  3 mL Intravenous Q12H  . timolol  1 drop Both Eyes QHS   Continuous Infusions: . sodium chloride       LOS: 3 days     Cordelia Poche, MD [facility] 07/14/2019, 4:33 PM  If 7PM-7AM, please contact night-coverage [email or web address]

## 2019-07-14 NOTE — Progress Notes (Signed)
[facility] ([facility]) - GIP RN Note     This a related and covered GIP admission with a [facility] diagnosis of CHF per Dr. Tomasa Hosteller. Patient was transported to the ED for evaluation of altered level of consciousness on 07/11/19.   Family elected to call EMS, after visit with hospice RN, for further evaluation. Patient is admitted with acute encephalopathy. Patient is a DNR.   Checked in with Dr. Lonny Prude, who stated that the granddaughter is concerned about a tremor. He did not see one today but it apparently affects her ability to feed herself. MD explained that this is likely not something that will be resolved in the hospital if it isn't improved with potassium replenishment.  Also spoke with patient granddaughter via telephone to discuss home vs [facility] for EOL care. RN had honest discussion with granddaughter about numerous organ systems failing and that confusion will only continue to increase and pt will continue to become more dependent on need for assistance with ADLs. Granddaughter spoke with pt and pt agrees that [facility] is the best place for her to spend her final days.    V/S: 98/81, 60, 22, 97% sats on 3L O2 via [place], temp 98 I&O: not documented Abnormal lab work: 07/14/2019 - WBC 2.4, NA 132, K ,3, Chl 87, BUN 68, Create 2.09, INR 1.6, Glucose115,  IVs/PRNs: Potassiym chloride 10 mEq in 161ml IVPB [id number] starting at 1128 this am.    EPIC MD Problem list: Cardiologist Consult:  -Failure to Thrive: Secondary to End Stage HF.  Now with liver failure and confusion, tbili 9. -continue with lactulose.  -Chronic systolic CHF: Nonischemic cardiomyopathy.  MDT CRT-P device.  ECHO 07/21/2018 with EF 35-40%, severe TR.  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  Patient has had progressive worsening of dyspnea and volume overload complicated by CKD (Creat baseline of 1.5 and 2.0 today). Continue with 2L Oxygen Agree with home with hospice after correction of  K.   Nephrology Consult:  Hepatic Encephalopathy - AFIB- CHF- HTN: Hold diuretics for 24h-Not a candidate for HD-Daily weights, Daily Renal Panel, Strict I/Os, Avoid nephrotoxins.     D/C planning: will continue to assess needs  - MD noted: patient to be discharged when medically stable. Please use [facility] for all transportation needs.  Goals of Care: Patient now a DNR, Patient family wishes for pt to go to [facility] when medically stable for transport.  Communication with IDT: Updated [place] team  Communication with PCG: Supported/updated patient granddaughter via telephone. Encouraged to call for assistance as needed.    Please call with any hospice related questions/concerns.  Freddie Breech, RN [facility] Liaison [phone]

## 2019-07-15 LAB — POTASSIUM: Potassium: 4.8 mmol/L (ref 3.5–5.1)

## 2019-07-15 MED ORDER — LORAZEPAM 2 MG/ML IJ SOLN
0.5000 mg | Freq: Once | INTRAMUSCULAR | Status: AC
  Administered 2019-07-15: 0.5 mg via INTRAVENOUS
  Filled 2019-07-15: qty 1

## 2019-07-15 NOTE — Progress Notes (Signed)
Patient appears cool in her bilateral upper and lower extremities. Temp 95.6 and repeat was 95.3 after applying a warm blanket. Notified [place]  NP and received an order for a  warming blanket. Will apply and continue to monitor frequent V/signs per mews score guideline.

## 2019-07-15 NOTE — Discharge Summary (Signed)
Physician Discharge Summary  Kim Mullins JOA:[id number] DOB: 11-25-1929 DOA: 07/11/2019  PCP: Lin Landsman, MD  Admit date: 07/11/2019 Discharge date: 07/15/2019  Admitted From: Home Disposition: [facility]   Discharge Condition: Hospice CODE STATUS: DNR Diet recommendation: Comfort  Brief/Interim Summary:  Admission HPI written by Kim Kand, MD   HPI: Kim Mullins is a 85 y.o. female with medical history significant of  Congestive heart failure with systolic dysfunction ejection fraction 30 to 35%, status post pacemaker secondary to complete AV block, atrial fibrillation, CVA, hypertension, hospice patient at home because of severe congestive heart failure came with a chief complaint of change in mental status. Per [facility], pt from home where she is a hospice pt due to CHF, LSN yesterday at 1500. Family reports that pt was not answering Questions appropriately today so they called 911. VSS. Alert and oriented to self. Not answering other questions appropriately. Slow responses ED Course:   Patient very lethargic response to painful stimulus follow some commands In the emergency room she was found to have worsening of the kidney function test and severe congestive heart failure with change in mental status.  Her BUN was 102 creatinine 2.0. CT scan no acute intracranial hemorrhage age-related atrophy No sign of sepsis chest x-ray and urine negative    Hospital course:  Acute metabolic encephalopathy Hepatic encephalopathy Patient appears to be/have developed liver injury. Ammonia level of 95 on admission. Bilirubin of 10.3 on admission. INR obtained and is 1.6. Symptoms improved with lactulose however, patient developed some delirium on day of discharge. Possibly related to terminal delirium. Will need continued management at [facility]  Chronic systolic heart failure Last EF of 35-40%. Patient was transitioned to hospice care prior to  admission and will now be discharged to [facility] place.  Hypothermia Possibly infectious related. Patient placed on [facility] with slow improvement in core temperature. She is now being discharged to [facility].  Hypokalemia Severe. Initially 3.5 on admission but became undetectably low with IV diuresis. Potassium supplementation given and discharged with a potassium of 4.8. No potassium on discharge since patient is now going to [facility]  AKI on CKD stage IV Baseline creatinine of about 1.7. Creatinine of 2.3 on admission and has improved slightly. Managed initially with Lasix IV but not tolerating secondary to hypotension.   Chronic atrial fibrillation Patient is on Eliquis as an outpatient which has apparently been discontinued since establishing with hospice. Started on heparin drip which is now discontinued.  Complete AV block S/p pacemaker  Discharge Diagnoses:  Principal Problem:   Acute encephalopathy Active Problems:   Esophageal stricture   Pulmonary HTN (HCC)   DCM (dilated cardiomyopathy) (HCC)   Benign essential HTN   Acute on chronic systolic CHF (congestive heart failure) (HCC)   OSA (obstructive sleep apnea)   Pacemaker   History of stroke   Atrial fibrillation (HCC)   CKD (chronic kidney disease), stage V (HCC)   Acute kidney injury superimposed on chronic kidney disease ([place])   Acute hepatic encephalopathy    Discharge Instructions   Allergies as of 07/15/2019      Reactions   Hydrocodone Other (See Comments)   Took away all energy and made her stop eating food for short time   Percocet [oxycodone-acetaminophen] Other (See Comments)   Too away all energy and made her stop eating food for short time   Eggs Or Egg-derived Products Hives, Rash   Mercurial Derivatives Itching, Rash   Penicillins Hives,  Rash   Has patient had a PCN reaction causing immediate rash, facial/tongue/throat swelling, SOB or lightheadedness with hypotension: Yes Has  patient had a PCN reaction causing severe rash involving mucus membranes or skin necrosis: No Has patient had a PCN reaction that required hospitalization: No Has patient had a PCN reaction occurring within the last 10 years: Yes If all of the above answers are "NO", then may proceed with Cephalosporin use.   Quinine Derivatives Hives, Rash      Medication List    STOP taking these medications   apixaban 2.5 MG Tabs tablet Commonly known as: Eliquis   bumetanide 2 MG tablet Commonly known as: BUMEX   diclofenac sodium 1 % Gel Commonly known as: VOLTAREN   fexofenadine 180 MG tablet Commonly known as: ALLEGRA   glucose blood test strip   hydrocortisone cream 1 %   latanoprost 0.005 % ophthalmic solution Commonly known as: XALATAN   metolazone 2.5 MG tablet Commonly known as: ZAROXOLYN   pantoprazole 40 MG tablet Commonly known as: PROTONIX   polyethylene glycol 17 g packet Commonly known as: MIRALAX / GLYCOLAX   senna 8.6 MG Tabs tablet Commonly known as: SENOKOT   timolol 0.5 % ophthalmic solution Commonly known as: TIMOPTIC   Tylenol 325 MG Caps Generic drug: Acetaminophen     TAKE these medications   OXYGEN Inhale 2 L into the lungs continuous.       Allergies  Allergen Reactions  . Hydrocodone Other (See Comments)    Took away all energy and made her stop eating food for short time  . Percocet [Oxycodone-Acetaminophen] Other (See Comments)    Too away all energy and made her stop eating food for short time  . Eggs Or Egg-Derived Products Hives and Rash  . Mercurial Derivatives Itching and Rash  . Penicillins Hives and Rash    Has patient had a PCN reaction causing immediate rash, facial/tongue/throat swelling, SOB or lightheadedness with hypotension: Yes Has patient had a PCN reaction causing severe rash involving mucus membranes or skin necrosis: No Has patient had a PCN reaction that required hospitalization: No Has patient had a PCN reaction  occurring within the last 10 years: Yes If all of the above answers are "NO", then may proceed with Cephalosporin use.  . Quinine Derivatives Hives and Rash    Consultations:  Hospice  Cardiology  Nephrology   Procedures/Studies: Ct Head Wo Contrast  Result Date: 07/11/2019 CLINICAL DATA:  85 year old female with focal neurologic deficit greater than 6 hours. Possible stroke. EXAM: CT HEAD WITHOUT CONTRAST TECHNIQUE: Contiguous axial images were obtained from the base of the skull through the vertex without intravenous contrast. COMPARISON:  Head CT dated 05/14/2019. FINDINGS: Brain: Mild-to-moderate age-related atrophy and chronic microvascular ischemic changes. There is no acute intracranial hemorrhage. No mass effect or midline shift. No extra-axial fluid collection. Vascular: No hyperdense vessel or unexpected calcification. Skull: Normal. Negative for fracture or focal lesion. Sinuses/Orbits: No acute finding. Other: None IMPRESSION: 1. No acute intracranial hemorrhage. 2. Age-related atrophy and chronic microvascular ischemic changes. Electronically Signed   By: Anner Crete M.D.   On: 07/11/2019 16:27   US Renal  Result Date: 07/11/2019 CLINICAL DATA:  Acute kidney injury EXAM: RENAL / URINARY TRACT ULTRASOUND COMPLETE COMPARISON:  CT dated 05/14/2019. FINDINGS: Right Kidney: Renal measurements: 10.6 x 3.8 x 4 cm = volume: 85.1 mL . Echogenicity within normal limits. No mass or hydronephrosis visualized. Left Kidney: Renal measurements: 7.8 x 3 x 3.9 cm =  volume: 48 mL. Echogenicity within normal limits. No mass or hydronephrosis visualized. Bladder: Appears normal for degree of bladder distention. The ureteral jets were not visualized. Other: There is a right-sided pleural effusion. IMPRESSION: 1. No acute sonographic abnormality.  No hydronephrosis. 2. Incidentally noted right-sided pleural effusion. Electronically Signed   By: Constance Holster M.D.   On: 07/11/2019 22:45   Dg  Chest Port 1 View  Result Date: 06/15/2019 CLINICAL DATA:  Shortness of breath EXAM: PORTABLE CHEST 1 VIEW COMPARISON:  06/11/2019 FINDINGS: Left pacer remains in place, unchanged. Cardiomegaly, vascular congestion. Low lung volumes with bibasilar atelectasis. No overt edema or effusions. No acute bony abnormality. IMPRESSION: Cardiomegaly with vascular congestion. Low volumes with bibasilar atelectasis. Electronically Signed   By: Rolm Baptise M.D.   On: 06/15/2019 12:06     Subjective: She is asking "where am I going?"  Discharge Exam: Vitals:   07/15/19 0554 07/15/19 1010  BP:  100/77  Pulse:  63  Resp:  18  Temp: (!) 95.8 F (35.4 C) (!) 97.1 F (36.2 C)  SpO2:  98%   Vitals:   07/15/19 0223 07/15/19 0551 07/15/19 0554 07/15/19 1010  BP: 97/81 98/73  100/77  Pulse: 62 68  63  Resp:  17  18  Temp: (!) 96.2 F (35.7 C)  (!) 95.8 F (35.4 C) (!) 97.1 F (36.2 C)  TempSrc:   Rectal Rectal  SpO2: 90%   98%    General: Pt is alert, awake, not in acute distress Cardiovascular: RRR, S1/S2 +, no rubs, no gallops Respiratory: Diminished bilaterally, no wheezing, no rhonchi Abdominal: Soft, NT, ND, bowel sounds + Neuro: oriented to person and place Psych: Confused    The results of significant diagnostics from this hospitalization (including imaging, microbiology, ancillary and laboratory) are listed below for reference.     Microbiology: Recent Results (from the past 240 hour(s))  SARS CORONAVIRUS 2 (TAT 6-24 HRS) Nasopharyngeal Nasopharyngeal Swab     Status: None   Collection Time: 07/11/19  7:22 PM   Specimen: Nasopharyngeal Swab  Result Value Ref Range Status   SARS Coronavirus 2 NEGATIVE NEGATIVE Final    Comment: (NOTE) SARS-CoV-2 target nucleic acids are NOT DETECTED. The SARS-CoV-2 RNA is generally detectable in upper and lower respiratory specimens during the acute phase of infection. Negative results do not preclude SARS-CoV-2 infection, do not rule out  co-infections with other pathogens, and should not be used as the sole basis for treatment or other patient management decisions. Negative results must be combined with clinical observations, patient history, and epidemiological information. The expected result is Negative. Fact Sheet for Patients: SugarRoll.be Fact Sheet for Healthcare Providers: https://www.woods-mathews.com/ This test is not yet approved or cleared by the [place] FDA and  has been authorized for detection and/or diagnosis of SARS-CoV-2 by FDA under an Emergency Use Authorization (EUA). This EUA will remain  in effect (meaning this test can be used) for the duration of the COVID-19 declaration under Section 56 4(b)(1) of the Act, 21 U.S.C. section 360bbb-3(b)(1), unless the authorization is terminated or revoked sooner. Performed at [facility], [place] [address] [place], [place] [address]   MRSA PCR Screening     Status: Abnormal   Collection Time: 07/12/19 12:45 PM   Specimen: Nasal Mucosa; Nasopharyngeal  Result Value Ref Range Status   MRSA by PCR POSITIVE (A) NEGATIVE Final    Comment:        The GeneXpert MRSA Assay (FDA approved for NASAL  specimens only), is one component of a comprehensive MRSA colonization surveillance program. It is not intended to diagnose MRSA infection nor to guide or monitor treatment for MRSA infections. RESULT CALLED TO, READ BACK BY AND VERIFIED WITH: Adah Salvage RN 14:50 07/12/19 ([email or web address]) Performed at [facility], [place] [address], [place], [place] [address]      Labs: BNP (last 3 results) Recent Labs    05/15/19 0516 06/11/19 1327 07/11/19 2322  BNP 1,217.6* 1,237.5* 2,202.5*   Basic Metabolic Panel: Recent Labs  Lab 07/11/19 1437 07/11/19 1513 07/11/19 2322 07/12/19 0332  07/13/19 0657 07/13/19 1628 07/14/19 0239 07/14/19 1138 07/15/19 0229  NA 129* 131*  --  135  --  132*  --   --   --   --   K  3.5 3.3*  --  2.2*   < > <2.0* 2.4* 2.6* 3.0* 4.8  CL 86* 86*  --  91*  --  87*  --   --   --   --   CO2 21*  --   --  24  --  26  --   --   --   --   GLUCOSE 128* 127*  --  82  --  115*  --   --   --   --   BUN 74* 102*  --  74*  --  68*  --   --   --   --   CREATININE 2.30* 2.00*  --  2.16*  --  2.09*  --   --   --   --   CALCIUM 8.7*  --   --  9.0  --  8.9  --   --   --   --   MG  --   --   --  2.4  --  2.4  --  2.4  --   --   PHOS  --   --  4.6 4.6  --  4.2  --   --   --   --    < > = values in this interval not displayed.   Liver Function Tests: Recent Labs  Lab 07/11/19 1437 07/12/19 0332 07/13/19 0657  AST 88* 39 37  ALT 16 16 15   ALKPHOS 92 77 75  BILITOT 10.3* 8.5* 9.0*  PROT 7.5 6.9 7.1  ALBUMIN 3.5 3.1* 3.2*   No results for input(s): LIPASE, AMYLASE in the last 168 hours. Recent Labs  Lab 07/12/19 0552  AMMONIA 95*   CBC: Recent Labs  Lab 07/11/19 1437 07/11/19 1513 07/12/19 0332 07/13/19 0657  WBC 4.2  --  3.7* 2.4*  NEUTROABS 3.4  --   --   --   HGB 13.9 16.3* 13.2 13.1  HCT 41.2 48.0* 40.6 39.3  MCV 87.3  --  89.4 86.9  PLT 187  --  158 163   Cardiac Enzymes: Recent Labs  Lab 07/11/19 2322  CKTOTAL 68   BNP: Invalid input(s): POCBNP CBG: Recent Labs  Lab 07/11/19 2142 07/13/19 0816 07/13/19 1223 07/13/19 1728 07/14/19 0722  GLUCAP 98 96 142* 190* 99   D-Dimer No results for input(s): DDIMER in the last 72 hours. Hgb A1c No results for input(s): HGBA1C in the last 72 hours. Lipid Profile No results for input(s): CHOL, HDL, LDLCALC, TRIG, CHOLHDL, LDLDIRECT in the last 72 hours. Thyroid function studies No results for input(s): TSH, T4TOTAL, T3FREE, THYROIDAB in the last 72 hours.  Invalid input(s): FREET3 Anemia work up  No results for input(s): VITAMINB12, FOLATE, FERRITIN, TIBC, IRON, RETICCTPCT in the last 72 hours. Urinalysis    Component Value Date/Time   COLORURINE YELLOW 07/11/2019 1730   APPEARANCEUR CLEAR 07/11/2019  1730   LABSPEC 1.006 07/11/2019 1730   PHURINE 7.0 07/11/2019 1730   GLUCOSEU NEGATIVE 07/11/2019 1730   GLUCOSEU NEGATIVE 07/10/2014 1517   HGBUR NEGATIVE 07/11/2019 1730   BILIRUBINUR NEGATIVE 07/11/2019 1730   KETONESUR NEGATIVE 07/11/2019 1730   PROTEINUR NEGATIVE 07/11/2019 1730   UROBILINOGEN 0.2 10/06/2014 1552   NITRITE NEGATIVE 07/11/2019 1730   LEUKOCYTESUR NEGATIVE 07/11/2019 1730   Sepsis Labs Invalid input(s): PROCALCITONIN,  WBC,  LACTICIDVEN Microbiology Recent Results (from the past 240 hour(s))  SARS CORONAVIRUS 2 (TAT 6-24 HRS) Nasopharyngeal Nasopharyngeal Swab     Status: None   Collection Time: 07/11/19  7:22 PM   Specimen: Nasopharyngeal Swab  Result Value Ref Range Status   SARS Coronavirus 2 NEGATIVE NEGATIVE Final    Comment: (NOTE) SARS-CoV-2 target nucleic acids are NOT DETECTED. The SARS-CoV-2 RNA is generally detectable in upper and lower respiratory specimens during the acute phase of infection. Negative results do not preclude SARS-CoV-2 infection, do not rule out co-infections with other pathogens, and should not be used as the sole basis for treatment or other patient management decisions. Negative results must be combined with clinical observations, patient history, and epidemiological information. The expected result is Negative. Fact Sheet for Patients: SugarRoll.be Fact Sheet for Healthcare Providers: https://www.woods-mathews.com/ This test is not yet approved or cleared by the [place] FDA and  has been authorized for detection and/or diagnosis of SARS-CoV-2 by FDA under an Emergency Use Authorization (EUA). This EUA will remain  in effect (meaning this test can be used) for the duration of the COVID-19 declaration under Section 56 4(b)(1) of the Act, 21 U.S.C. section 360bbb-3(b)(1), unless the authorization is terminated or revoked sooner. Performed at [facility], [place] [address] [place], [place] [address]   MRSA PCR Screening     Status: Abnormal   Collection Time: 07/12/19 12:45 PM   Specimen: Nasal Mucosa; Nasopharyngeal  Result Value Ref Range Status   MRSA by PCR POSITIVE (A) NEGATIVE Final    Comment:        The GeneXpert MRSA Assay (FDA approved for NASAL specimens only), is one component of a comprehensive MRSA colonization surveillance program. It is not intended to diagnose MRSA infection nor to guide or monitor treatment for MRSA infections. RESULT CALLED TO, READ BACK BY AND VERIFIED WITH: Adah Salvage RN 14:50 07/12/19 ([email or web address]) Performed at [facility], [place] [address], [place], [place] [address]      Time coordinating discharge: 35 minutes  SIGNED:   Cordelia Poche, MD [facility] 07/15/2019, 11:37 AM

## 2019-07-15 NOTE — TOC Transition Note (Signed)
Transition of Care [facility]) - CM/SW Discharge Note   Patient Details  Name: Kim Mullins MRN: [id number] Date of Birth: Jul 05, 1930  Transition of Care [facility] CM/SW Contact:  Eileen Stanford, LCSW Phone Number: 07/15/2019, 11:51 AM   Clinical Narrative:   Clinical Social Worker facilitated patient discharge including contacting patient family and facility to confirm patient discharge plans.  Clinical information faxed to facility and family agreeable with plan.  CSW arranged ambulance transport via [facility] to [facility] .  RN to call [phone] for report prior to discharge.    Final next level of care: [place] Barriers to Discharge: No Barriers Identified   Patient Goals and CMS Choice        Discharge Placement              Patient chooses bed at: [facility]) Patient to be transferred to facility by: [place] Name of family member notified: Granddaughter Patient and family notified of of transfer: 07/15/19  Discharge Plan and Services                                     Social Determinants of Health (SDOH) Interventions     Readmission Risk Interventions No flowsheet data found.

## 2019-07-15 NOTE — Progress Notes (Addendum)
Patient has been heated on the bair Hugger to 38 degree and she's still running low temp. Temp went up to 96. 2 and now trended down to 95.8. Patient is warm and dry to touch. No acute distress noted. Patient has not slept the whole night and keeps on calling her  family members names. Notified [place] NP without any new order. Will continue to monitor.

## 2019-07-15 NOTE — Progress Notes (Signed)
[facility] Note  [facility] bed has been offered. Please call report to [phone] upon discharge and fax discharge summary to [phone]. TOC manager will be notified once paperwork has been signed so that transport may be arranged.  Freddie Breech, RN  [facility] Liaison [phone]

## 2019-07-15 NOTE — Progress Notes (Addendum)
Patient's temperature is gradually going up ie 96.2   after applying the bair hugger. Will  continue to monitor.

## 2019-07-15 NOTE — Progress Notes (Signed)
Francine Graven to be D/C'd to [facility] place per MD order.  Discussed with the patient and all questions fully answered.  VSS, Skin clean, dry and intact without evidence of skin break down, no evidence of skin tears noted. IV catheter discontinued intact. Site without signs and symptoms of complications. Dressing and pressure applied.  An After Visit Summary was printed and given to the patient. Patient received prescription.  D/c education completed with patient/family including follow up instructions, medication list, d/c activities limitations if indicated, with other d/c instructions as indicated by MD - patient able to verbalize understanding, all questions fully answered.   Patient instructed to return to ED, call 911, or call MD for any changes in condition.   Patient escorted via [facility], and D/C to [facility].  Report given to RN [place] 07/15/2019 12:54 PM

## 2019-07-16 ENCOUNTER — Other Ambulatory Visit (HOSPITAL_COMMUNITY): Payer: Self-pay

## 2019-07-16 NOTE — Progress Notes (Signed)
Today I was informed patient was d/c to [facility] after hospital admission. I spoke with Granddaughter who authorized me to come by to visit [place]. Upon my arrival to [place] was sleeping in which her granddaughter stated she had been doing since admitted. Granddaughter stated patient had not eaten in a few days. Granddaughter was advised to keep me updated if possible, she agreed with same. Will continue to follow up.

## 2019-07-23 ENCOUNTER — Telehealth (HOSPITAL_COMMUNITY): Payer: Self-pay

## 2019-07-23 NOTE — Telephone Encounter (Signed)
Spoke to [place] to check in on Kim Mullins and she advised no real changes from last week. She stated she has not been eating, taking her medications and reports her sleeping most of the day. Hospice is overseeing her care. I will continue to check in with [facility] for [place]. All paramedicine care at a stop for now until otherwise noted.

## 2019-07-31 DEATH — deceased

## 2019-08-07 ENCOUNTER — Telehealth: Payer: Self-pay

## 2019-08-07 NOTE — Telephone Encounter (Signed)
Left message for patient to remind of missed remote transmission.  

## 2019-09-11 ENCOUNTER — Telehealth: Payer: Self-pay

## 2019-09-11 NOTE — Telephone Encounter (Signed)
I spoke with the pt granddaughter and verified the address. I'm sending a return kit for the pt monitor.

## 2019-09-11 NOTE — Telephone Encounter (Signed)
New message   Patient's grandaughter wants to know if the device wants to know if device should be returned she is deceased. Please call.
